# Patient Record
Sex: Female | Born: 1950
Health system: Southern US, Community
[De-identification: ages and names within clinical notes are randomized; demographics above are authoritative.]

## PROBLEM LIST (undated history)

## (undated) DIAGNOSIS — J449 Chronic obstructive pulmonary disease, unspecified: Secondary | ICD-10-CM

## (undated) DIAGNOSIS — R112 Nausea with vomiting, unspecified: Secondary | ICD-10-CM

## (undated) DIAGNOSIS — E119 Type 2 diabetes mellitus without complications: Secondary | ICD-10-CM

## (undated) DIAGNOSIS — N189 Chronic kidney disease, unspecified: Secondary | ICD-10-CM

## (undated) DIAGNOSIS — Z9889 Other specified postprocedural states: Secondary | ICD-10-CM

## (undated) DIAGNOSIS — D649 Anemia, unspecified: Secondary | ICD-10-CM

## (undated) DIAGNOSIS — R51 Headache: Secondary | ICD-10-CM

## (undated) DIAGNOSIS — I2510 Atherosclerotic heart disease of native coronary artery without angina pectoris: Secondary | ICD-10-CM

## (undated) DIAGNOSIS — R519 Headache, unspecified: Secondary | ICD-10-CM

## (undated) DIAGNOSIS — I10 Essential (primary) hypertension: Secondary | ICD-10-CM

## (undated) DIAGNOSIS — F419 Anxiety disorder, unspecified: Secondary | ICD-10-CM

## (undated) DIAGNOSIS — I509 Heart failure, unspecified: Secondary | ICD-10-CM

## (undated) DIAGNOSIS — IMO0001 Reserved for inherently not codable concepts without codable children: Secondary | ICD-10-CM

## (undated) HISTORY — PX: BREAST EXCISIONAL BIOPSY: SUR124

## (undated) HISTORY — PX: CHOLECYSTECTOMY: SHX55

## (undated) HISTORY — PX: ELBOW SURGERY: SHX618

## (undated) HISTORY — PX: BREAST SURGERY: SHX581

## (undated) HISTORY — PX: APPENDECTOMY: SHX54

## (undated) HISTORY — PX: ABDOMINAL HYSTERECTOMY: SHX81

## (undated) HISTORY — PX: CORONARY STENT PLACEMENT: SHX1402

## (undated) HISTORY — PX: CARDIAC CATHETERIZATION: SHX172

## (undated) HISTORY — PX: ANAL FISSURE REPAIR: SHX2312

## (undated) HISTORY — PX: TUBAL LIGATION: SHX77

---

## 1997-10-05 ENCOUNTER — Other Ambulatory Visit: Admission: RE | Admit: 1997-10-05 | Discharge: 1997-10-05 | Payer: Self-pay | Admitting: Obstetrics & Gynecology

## 1998-08-22 ENCOUNTER — Encounter: Admission: RE | Admit: 1998-08-22 | Discharge: 1998-11-20 | Payer: Self-pay | Admitting: Internal Medicine

## 1999-03-27 ENCOUNTER — Encounter: Payer: Self-pay | Admitting: Internal Medicine

## 1999-03-27 ENCOUNTER — Encounter: Admission: RE | Admit: 1999-03-27 | Discharge: 1999-03-27 | Payer: Self-pay | Admitting: Internal Medicine

## 1999-04-12 ENCOUNTER — Encounter: Admission: RE | Admit: 1999-04-12 | Discharge: 1999-04-12 | Payer: Self-pay | Admitting: Internal Medicine

## 1999-04-12 ENCOUNTER — Encounter: Payer: Self-pay | Admitting: Internal Medicine

## 1999-04-19 ENCOUNTER — Encounter: Payer: Self-pay | Admitting: General Surgery

## 1999-04-24 ENCOUNTER — Ambulatory Visit (HOSPITAL_COMMUNITY): Admission: RE | Admit: 1999-04-24 | Discharge: 1999-04-24 | Payer: Self-pay | Admitting: General Surgery

## 1999-04-24 ENCOUNTER — Encounter: Payer: Self-pay | Admitting: General Surgery

## 1999-04-24 ENCOUNTER — Encounter (INDEPENDENT_AMBULATORY_CARE_PROVIDER_SITE_OTHER): Payer: Self-pay

## 2000-04-14 ENCOUNTER — Encounter: Payer: Self-pay | Admitting: Obstetrics & Gynecology

## 2000-04-14 ENCOUNTER — Encounter: Admission: RE | Admit: 2000-04-14 | Discharge: 2000-04-14 | Payer: Self-pay | Admitting: Obstetrics & Gynecology

## 2000-07-10 ENCOUNTER — Ambulatory Visit (HOSPITAL_COMMUNITY): Admission: RE | Admit: 2000-07-10 | Discharge: 2000-07-11 | Payer: Self-pay | Admitting: Interventional Cardiology

## 2001-04-17 ENCOUNTER — Encounter: Admission: RE | Admit: 2001-04-17 | Discharge: 2001-04-17 | Payer: Self-pay | Admitting: Obstetrics & Gynecology

## 2001-04-17 ENCOUNTER — Encounter: Payer: Self-pay | Admitting: Obstetrics & Gynecology

## 2002-01-06 ENCOUNTER — Encounter: Admission: RE | Admit: 2002-01-06 | Discharge: 2002-04-06 | Payer: Self-pay | Admitting: Internal Medicine

## 2002-04-01 ENCOUNTER — Ambulatory Visit (HOSPITAL_COMMUNITY): Admission: RE | Admit: 2002-04-01 | Discharge: 2002-04-01 | Payer: Self-pay | Admitting: Interventional Cardiology

## 2002-05-11 ENCOUNTER — Encounter: Admission: RE | Admit: 2002-05-11 | Discharge: 2002-05-11 | Payer: Self-pay | Admitting: Obstetrics & Gynecology

## 2002-05-11 ENCOUNTER — Encounter: Payer: Self-pay | Admitting: Obstetrics & Gynecology

## 2003-02-25 ENCOUNTER — Ambulatory Visit (HOSPITAL_COMMUNITY): Admission: RE | Admit: 2003-02-25 | Discharge: 2003-02-25 | Payer: Self-pay | Admitting: Internal Medicine

## 2003-05-23 ENCOUNTER — Encounter: Admission: RE | Admit: 2003-05-23 | Discharge: 2003-05-23 | Payer: Self-pay | Admitting: Obstetrics & Gynecology

## 2004-05-28 ENCOUNTER — Encounter: Admission: RE | Admit: 2004-05-28 | Discharge: 2004-05-28 | Payer: Self-pay | Admitting: Obstetrics & Gynecology

## 2005-06-03 ENCOUNTER — Encounter: Admission: RE | Admit: 2005-06-03 | Discharge: 2005-06-03 | Payer: Self-pay | Admitting: Internal Medicine

## 2006-06-04 ENCOUNTER — Encounter: Admission: RE | Admit: 2006-06-04 | Discharge: 2006-06-04 | Payer: Self-pay | Admitting: Internal Medicine

## 2006-06-17 ENCOUNTER — Encounter (INDEPENDENT_AMBULATORY_CARE_PROVIDER_SITE_OTHER): Payer: Self-pay | Admitting: Specialist

## 2006-06-17 ENCOUNTER — Encounter: Admission: RE | Admit: 2006-06-17 | Discharge: 2006-06-17 | Payer: Self-pay | Admitting: Internal Medicine

## 2006-12-16 ENCOUNTER — Encounter: Admission: RE | Admit: 2006-12-16 | Discharge: 2006-12-16 | Payer: Self-pay | Admitting: Internal Medicine

## 2007-06-08 ENCOUNTER — Encounter: Admission: RE | Admit: 2007-06-08 | Discharge: 2007-06-08 | Payer: Self-pay | Admitting: Internal Medicine

## 2007-08-03 ENCOUNTER — Ambulatory Visit (HOSPITAL_COMMUNITY): Admission: RE | Admit: 2007-08-03 | Discharge: 2007-08-03 | Payer: Self-pay | Admitting: Internal Medicine

## 2008-06-08 ENCOUNTER — Encounter: Admission: RE | Admit: 2008-06-08 | Discharge: 2008-06-08 | Payer: Self-pay | Admitting: Internal Medicine

## 2009-06-09 ENCOUNTER — Encounter: Admission: RE | Admit: 2009-06-09 | Discharge: 2009-06-09 | Payer: Self-pay | Admitting: Obstetrics & Gynecology

## 2010-06-02 ENCOUNTER — Other Ambulatory Visit: Payer: Self-pay | Admitting: Internal Medicine

## 2010-06-02 DIAGNOSIS — Z0000 Encounter for general adult medical examination without abnormal findings: Secondary | ICD-10-CM

## 2010-06-13 ENCOUNTER — Ambulatory Visit
Admission: RE | Admit: 2010-06-13 | Discharge: 2010-06-13 | Disposition: A | Discharge status: Home / Self Care | Payer: Self-pay | Source: Ambulatory Visit | Attending: Internal Medicine | Admitting: Internal Medicine

## 2010-06-13 DIAGNOSIS — Z0000 Encounter for general adult medical examination without abnormal findings: Secondary | ICD-10-CM

## 2010-09-28 NOTE — Cardiovascular Report (Signed)
NAME:  Pamela [name], Pamela [name]                        ACCOUNT NO.:  [id number]   MEDICAL RECORD NO.:  [id number]                   PATIENT TYPE:  OIB   LOCATION:  2852                                 FACILITY:  [facility]   PHYSICIAN:  Lesleigh Noe, M.D.            DATE OF BIRTH:  1950/12/16   DATE OF PROCEDURE:  04/01/2002  DATE OF DISCHARGE:                              CARDIAC CATHETERIZATION   INDICATIONS FOR PROCEDURE:  Exertional dyspnea, and also angina.  The  dyspnea has gotten to the point that the patient has difficulty walking next  door to visit her parents which is less then 200 yards.  She denies  orthopnea and PND.  The dyspnea is not necessarily associated with chest  discomfort.   PROCEDURE:  1. Left heart catheterization.  2. Selective coronary angiography.  3. Left ventriculography.   DESCRIPTION OF PROCEDURE:  After informed consent, a 6 French sheath was  inserted into the right femoral artery using the modified Seldinger  technique.  A 6 French 8 multiple purpose catheter was used for hemodynamic  recording, left ventriculography by hand injection, selectively right  coronary angiography.  Left coronary angiography was performed with a 6  French #4 left Judkins catheter.  The patient tolerated the procedure  without significant complications.   RESULTS:  1. Hemodynamic data:     a. Aortic pressure was 129/72.     b. Left ventricular pressure was 131/15.  2. Left ventriculography:  The left ventricle experienced significant ectopy     during the hand injection.  Despite this, it is judged that overall     contractility is normal.  Ejection fraction is greater than 60%.  3. Selective coronary angiography:     a. Left main coronary:  The left main coronary artery  is short.  No        significant obstruction is noted.     b. Left anterior descending coronary:  The left anterior descending is        heavily calcified.  There is a region within the mid left  anterior        descending stent just beyond the first septal perforator, but there is        perhaps up to 40 to 50% narrowing.  This is seen in only one view.        All other views demonstrate a widely patent stented region.  The        diagonal that arises proximal to the origin of the left anterior        descending stent contains diffusely diseased mid segment with up to 80        to 90% narrowing in the medial branch of the bifurcation of this        diagonal.  This is unchanged from the prior study.     c. Circumflex artery:  No significant obstruction is noted in the  circumflex.  The circumflex gives origin to a large branch and first        obtuse marginal, and a small AV groove continuation supplies the left        atrial recurrent.  No obstruction is noted within the circumflex        system.     d. Right coronary:  The right coronary artery contains no significant        obstruction.  There is a origin to the posterior descending artery,        and a small left ventricular branch, as well as several LV branches.   CONCLUSIONS:  1. Widely patent left anterior descending stent with at most 30 to 50% mid     stent narrowing.  The first septal perforator that arises from within the     stented region is 80%.  The first diagonal at a region of bifurcation in     its mid segment contains an 80 to 90% narrowing.  This is unchanged from     prior evaluation.  Circumflex and right coronary arteries are normal.  2. Normal left ventricular function with normal left ventricular pressure.  3. Dyspnea on exertion.  Etiology uncertain.  Related possibly to diastolic     dysfunction.   PLAN:  1. Consider diuretic therapy.  2. Increase beta blocker therapy to help treat possible diastolic     dysfunction.  3. If medical therapy does not help dyspnea, consider pulmonary evaluation.                                               Lesleigh Noe, M.D.    HWS/MEDQ  D:   04/01/2002  T:  04/01/2002  Job:  [id number]   cc:   Theressa Millard, M.D.  [address] [place]  [place] [address]  Fax: [phone]

## 2011-05-13 ENCOUNTER — Other Ambulatory Visit: Payer: Self-pay | Admitting: Obstetrics & Gynecology

## 2011-05-13 DIAGNOSIS — Z1231 Encounter for screening mammogram for malignant neoplasm of breast: Secondary | ICD-10-CM

## 2011-06-17 ENCOUNTER — Ambulatory Visit
Admission: RE | Admit: 2011-06-17 | Discharge: 2011-06-17 | Disposition: A | Discharge status: Home / Self Care | Payer: 59 | Source: Ambulatory Visit | Attending: Obstetrics & Gynecology | Admitting: Obstetrics & Gynecology

## 2011-06-17 DIAGNOSIS — Z1231 Encounter for screening mammogram for malignant neoplasm of breast: Secondary | ICD-10-CM

## 2012-05-26 ENCOUNTER — Other Ambulatory Visit: Payer: Self-pay | Admitting: Obstetrics & Gynecology

## 2012-05-26 DIAGNOSIS — Z1231 Encounter for screening mammogram for malignant neoplasm of breast: Secondary | ICD-10-CM

## 2012-06-30 ENCOUNTER — Ambulatory Visit
Admission: RE | Admit: 2012-06-30 | Discharge: 2012-06-30 | Disposition: A | Discharge status: Home / Self Care | Payer: 59 | Source: Ambulatory Visit | Attending: Obstetrics & Gynecology | Admitting: Obstetrics & Gynecology

## 2012-06-30 DIAGNOSIS — Z1231 Encounter for screening mammogram for malignant neoplasm of breast: Secondary | ICD-10-CM

## 2012-10-29 ENCOUNTER — Encounter (HOSPITAL_COMMUNITY): Payer: Self-pay | Admitting: *Deleted

## 2012-10-29 ENCOUNTER — Other Ambulatory Visit: Payer: Self-pay | Admitting: Gastroenterology

## 2012-10-29 DIAGNOSIS — E119 Type 2 diabetes mellitus without complications: Secondary | ICD-10-CM

## 2012-10-29 HISTORY — PX: GANGLION CYST EXCISION: SHX1691

## 2012-10-29 HISTORY — DX: Type 2 diabetes mellitus without complications: E11.9

## 2012-10-30 ENCOUNTER — Encounter (HOSPITAL_COMMUNITY): Payer: Self-pay | Admitting: Pharmacy Technician

## 2012-11-17 ENCOUNTER — Ambulatory Visit (HOSPITAL_COMMUNITY)
Admission: RE | Admit: 2012-11-17 | Discharge: 2012-11-17 | Disposition: A | Discharge status: Home / Self Care | Payer: 59 | Source: Ambulatory Visit | Attending: Gastroenterology | Admitting: Gastroenterology

## 2012-11-17 ENCOUNTER — Encounter (HOSPITAL_COMMUNITY): Payer: Self-pay | Admitting: Anesthesiology

## 2012-11-17 ENCOUNTER — Ambulatory Visit (HOSPITAL_COMMUNITY): Payer: 59 | Admitting: Anesthesiology

## 2012-11-17 ENCOUNTER — Encounter (HOSPITAL_COMMUNITY): Payer: Self-pay | Admitting: Certified Registered Nurse Anesthetist

## 2012-11-17 ENCOUNTER — Encounter (HOSPITAL_COMMUNITY)
Admission: RE | Disposition: A | Discharge status: Home / Self Care | Payer: Self-pay | Source: Ambulatory Visit | Attending: Gastroenterology

## 2012-11-17 DIAGNOSIS — J449 Chronic obstructive pulmonary disease, unspecified: Secondary | ICD-10-CM | POA: Insufficient documentation

## 2012-11-17 DIAGNOSIS — T39095A Adverse effect of salicylates, initial encounter: Secondary | ICD-10-CM | POA: Insufficient documentation

## 2012-11-17 DIAGNOSIS — I129 Hypertensive chronic kidney disease with stage 1 through stage 4 chronic kidney disease, or unspecified chronic kidney disease: Secondary | ICD-10-CM | POA: Insufficient documentation

## 2012-11-17 DIAGNOSIS — J4489 Other specified chronic obstructive pulmonary disease: Secondary | ICD-10-CM | POA: Insufficient documentation

## 2012-11-17 DIAGNOSIS — Z7982 Long term (current) use of aspirin: Secondary | ICD-10-CM | POA: Insufficient documentation

## 2012-11-17 DIAGNOSIS — K2960 Other gastritis without bleeding: Secondary | ICD-10-CM | POA: Insufficient documentation

## 2012-11-17 DIAGNOSIS — I2510 Atherosclerotic heart disease of native coronary artery without angina pectoris: Secondary | ICD-10-CM | POA: Insufficient documentation

## 2012-11-17 DIAGNOSIS — E11359 Type 2 diabetes mellitus with proliferative diabetic retinopathy without macular edema: Secondary | ICD-10-CM | POA: Insufficient documentation

## 2012-11-17 DIAGNOSIS — N189 Chronic kidney disease, unspecified: Secondary | ICD-10-CM | POA: Insufficient documentation

## 2012-11-17 DIAGNOSIS — D509 Iron deficiency anemia, unspecified: Secondary | ICD-10-CM | POA: Insufficient documentation

## 2012-11-17 DIAGNOSIS — E1139 Type 2 diabetes mellitus with other diabetic ophthalmic complication: Secondary | ICD-10-CM | POA: Insufficient documentation

## 2012-11-17 HISTORY — PX: ESOPHAGOGASTRODUODENOSCOPY (EGD) WITH PROPOFOL: SHX5813

## 2012-11-17 HISTORY — DX: Atherosclerotic heart disease of native coronary artery without angina pectoris: I25.10

## 2012-11-17 HISTORY — DX: Type 2 diabetes mellitus without complications: E11.9

## 2012-11-17 HISTORY — DX: Essential (primary) hypertension: I10

## 2012-11-17 HISTORY — DX: Nausea with vomiting, unspecified: R11.2

## 2012-11-17 HISTORY — PX: COLONOSCOPY WITH PROPOFOL: SHX5780

## 2012-11-17 HISTORY — DX: Other specified postprocedural states: Z98.890

## 2012-11-17 HISTORY — DX: Chronic obstructive pulmonary disease, unspecified: J44.9

## 2012-11-17 LAB — POCT I-STAT 4, (NA,K, GLUC, HGB,HCT)
Glucose, Bld: 200 mg/dL — ABNORMAL HIGH (ref 70–99)
HCT: 36 % (ref 36.0–46.0)

## 2012-11-17 SURGERY — COLONOSCOPY WITH PROPOFOL
Anesthesia: Monitor Anesthesia Care

## 2012-11-17 MED ORDER — SODIUM CHLORIDE 0.9 % IV SOLN: INTRAVENOUS | Status: DC

## 2012-11-17 MED ORDER — KETAMINE HCL 10 MG/ML IJ SOLN
INTRAMUSCULAR | Status: DC | PRN
  Administered 2012-11-17 (×3): 10 mg via INTRAVENOUS

## 2012-11-17 MED ORDER — PROMETHAZINE HCL 25 MG/ML IJ SOLN: 6.2500 mg | INTRAMUSCULAR | Status: DC | PRN

## 2012-11-17 MED ORDER — LACTATED RINGERS IV SOLN
INTRAVENOUS | Status: DC
  Administered 2012-11-17: 09:00:00 via INTRAVENOUS
  Administered 2012-11-17: 1000 mL via INTRAVENOUS

## 2012-11-17 MED ORDER — PROPOFOL INFUSION 10 MG/ML OPTIME
INTRAVENOUS | Status: DC | PRN
  Administered 2012-11-17: 120 ug/kg/min via INTRAVENOUS

## 2012-11-17 MED ORDER — MIDAZOLAM HCL 5 MG/5ML IJ SOLN
INTRAMUSCULAR | Status: DC | PRN
  Administered 2012-11-17: 2 mg via INTRAVENOUS

## 2012-11-17 MED ORDER — LIDOCAINE HCL 1 % IJ SOLN
INTRAMUSCULAR | Status: AC
  Filled 2012-11-17: qty 20

## 2012-11-17 SURGICAL SUPPLY — 24 items

## 2012-11-17 NOTE — Op Note (Signed)
Problem: Iron deficiency anemia taking aspirin 325 mg daily  Endoscopist: Johnson  Premedication: Propofol administered by anesthesia  Procedure: Diagnostic esophagogastroduodenoscopy. The patient was placed in the left lateral decubitus position. The Pentax gastroscope was passed through the posterior hypopharynx into the proximal esophagus without difficulty. The hypopharynx, larynx, and vocal cords appeared normal.  Esophagoscopy: The proximal, mid, and lower segments of the esophageal mucosa appear normal. The squamocolumnar junction is noted at 40 cm from the incisor teeth. There is no endoscopic evidence for the presence of erosive esophagitis or Barrett's esophagus.  Gastroscopy: Retroflex view of the gastric cardia and fundus was normal. The gastric body appeared normal. There are a few scattered nonbleeding erosions in the gastric antrum. The pylorus is normal.  Duodenoscopy: The duodenal bulb and descending duodenum appear normal.  Assessment: Normal esophagogastroduodenoscopy except for the presence of a few erosions in the gastric antrum secondary to chronic aspirin use.  Procedure: Diagnostic colonoscopy. Anal inspection and digital rectal exam were normal. The Pentax ultrathin colonoscope was introduced into the rectum and advanced to the cecum. A normal-appearing ileocecal valve and appendiceal orifice were identified. Colonic preparation for the exam today was good.  Rectum. Normal. Retroflex view of the distal rectum normal.  Sigmoid colon and descending colon. Normal.  Splenic flexure. Normal.  Transverse colon. Normal.  Hepatic flexure. Normal.  Ascending colon. Normal.  Cecum and ileocecal valve. Normal.  Assessment: Normal diagnostic proctocolonoscopy to the cecum.  Recommendations: Take oral iron sulfate 325 mg daily. Check serum ferritin and hemoglobin in 12 weeks. Reduce aspirin dose to 81 mg daily .

## 2012-11-17 NOTE — Anesthesia Postprocedure Evaluation (Signed)
Anesthesia Post Note  Patient: Pamela [name]  Procedure(s) Performed: Procedure(s) (LRB): COLONOSCOPY WITH PROPOFOL (N/A) ESOPHAGOGASTRODUODENOSCOPY (EGD) WITH PROPOFOL (N/A)  Anesthesia type: MAC  Patient location: PACU  Post pain: Pain level controlled  Post assessment: Post-op Vital signs reviewed  Last Vitals:  Filed Vitals:   11/17/12 1031  BP: 148/77  Pulse:   Temp:   Resp: 18    Post vital signs: Reviewed  Level of consciousness: sedated  Complications: No apparent anesthesia complications

## 2012-11-17 NOTE — Anesthesia Preprocedure Evaluation (Signed)
Anesthesia Evaluation  Patient identified by MRN, date of birth, ID band Patient awake    Reviewed: Allergy & Precautions, H&P , NPO status , Patient's Chart, lab work & pertinent test results  History of Anesthesia Complications (+) PONV  Airway Mallampati: II TM Distance: >3 FB Neck ROM: Full    Dental  (+) Edentulous Upper and Edentulous Lower   Pulmonary shortness of breath and with exertion, COPD COPD inhaler,  breath sounds clear to auscultation  Pulmonary exam normal       Cardiovascular hypertension, Pt. on medications + CAD and + Cardiac Stents Rhythm:Regular Rate:Tachycardia     Neuro/Psych negative neurological ROS  negative psych ROS   GI/Hepatic negative GI ROS, Neg liver ROS,   Endo/Other  diabetes, Type 2, Oral Hypoglycemic Agents and Insulin Dependent  Renal/GU negative Renal ROS  negative genitourinary   Musculoskeletal negative musculoskeletal ROS (+)   Abdominal   Peds  Hematology negative hematology ROS (+)   Anesthesia Other Findings   Reproductive/Obstetrics                           Anesthesia Physical Anesthesia Plan  ASA: III  Anesthesia Plan: MAC   Post-op Pain Management:    Induction: Intravenous  Airway Management Planned: Simple Face Mask  Additional Equipment:   Intra-op Plan:   Post-operative Plan:   Informed Consent: I have reviewed the patients History and Physical, chart, labs and discussed the procedure including the risks, benefits and alternatives for the proposed anesthesia with the patient or authorized representative who has indicated his/her understanding and acceptance.   Dental advisory given  Plan Discussed with: CRNA  Anesthesia Plan Comments:         Anesthesia Quick Evaluation

## 2012-11-17 NOTE — Transfer of Care (Signed)
Immediate Anesthesia Transfer of Care Note  Patient: MAISEE VOLLMAN  Procedure(s) Performed: Procedure(s): COLONOSCOPY WITH PROPOFOL (N/A) ESOPHAGOGASTRODUODENOSCOPY (EGD) WITH PROPOFOL (N/A)  Patient Location: PACU and Endoscopy Unit  Anesthesia Type:MAC  Level of Consciousness: awake and alert   Airway & Oxygen Therapy: Patient Spontanous Breathing and Patient connected to nasal cannula oxygen  Post-op Assessment: Report given to PACU RN and Post -op Vital signs reviewed and stable  Post vital signs: Reviewed and stable  Complications: No apparent anesthesia complications

## 2012-11-17 NOTE — H&P (Signed)
  Problem: Iron deficiency anemia  History: The patient is a 62 year old female born 05/14/1950. The patient underwent a normal screening colonoscopy in March 1999. The patient was diagnosed with iron deficiency anemia with a serum ferritin 4.4 ng/mL. She takes aspirin 325 mg on a daily basis. She had coronary artery stents placed in 2004.  The patient feels well. Intermittently she experiences pain in the rectum. Her bowel movements remain normal. She denies gastrointestinal bleeding.  The patient is scheduled to undergo a diagnostic esophagogastroduodenoscopy and colonoscopy to evaluate iron deficiency anemia.  Past medical and surgical history: Chronic obstructive pulmonary disease. Coronary artery disease. 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 Hypertension. Cholecystectomy. Hysterectomy. Appendectomy.  Allergies: None  Exam: The patient is alert and lying comfortably on the endoscopy stretcher. Abdomen is soft, flat, and nontender to palpation. Cardiac exam reveals a regular rhythm. Lungs are clear to auscultation.  Plan: Proceed with diagnostic esophagogastroduodenoscopy and colonoscopy to evaluate iron deficiency anemia while taking aspirin 325 mg daily basis.

## 2012-11-18 ENCOUNTER — Encounter (HOSPITAL_COMMUNITY): Payer: Self-pay | Admitting: Gastroenterology

## 2013-06-01 ENCOUNTER — Other Ambulatory Visit: Payer: Self-pay

## 2013-06-01 DIAGNOSIS — Z1231 Encounter for screening mammogram for malignant neoplasm of breast: Secondary | ICD-10-CM

## 2013-06-01 DIAGNOSIS — Z803 Family history of malignant neoplasm of breast: Secondary | ICD-10-CM

## 2013-07-01 ENCOUNTER — Ambulatory Visit
Admission: RE | Admit: 2013-07-01 | Discharge: 2013-07-01 | Disposition: A | Discharge status: Home / Self Care | Payer: 59 | Source: Ambulatory Visit

## 2013-07-01 DIAGNOSIS — Z1231 Encounter for screening mammogram for malignant neoplasm of breast: Secondary | ICD-10-CM

## 2013-07-01 DIAGNOSIS — Z803 Family history of malignant neoplasm of breast: Secondary | ICD-10-CM

## 2014-03-25 ENCOUNTER — Inpatient Hospital Stay (HOSPITAL_COMMUNITY)
Admission: EM | Admit: 2014-03-25 | Discharge: 2014-03-28 | DRG: 812 | Disposition: A | Discharge status: Home / Self Care | Payer: Medicare Other | Attending: Family Medicine | Admitting: Family Medicine

## 2014-03-25 ENCOUNTER — Encounter (HOSPITAL_COMMUNITY): Payer: Self-pay | Admitting: *Deleted

## 2014-03-25 ENCOUNTER — Encounter: Payer: Self-pay | Admitting: Interventional Cardiology

## 2014-03-25 ENCOUNTER — Ambulatory Visit (INDEPENDENT_AMBULATORY_CARE_PROVIDER_SITE_OTHER): Payer: Medicare Other | Admitting: Interventional Cardiology

## 2014-03-25 ENCOUNTER — Telehealth: Payer: Self-pay

## 2014-03-25 ENCOUNTER — Emergency Department (HOSPITAL_COMMUNITY): Payer: Medicare Other

## 2014-03-25 VITALS — BP 140/90 | HR 92 | Ht 61.0 in | Wt 118.0 lb

## 2014-03-25 DIAGNOSIS — Z7982 Long term (current) use of aspirin: Secondary | ICD-10-CM

## 2014-03-25 DIAGNOSIS — Z87891 Personal history of nicotine dependence: Secondary | ICD-10-CM | POA: Diagnosis not present

## 2014-03-25 DIAGNOSIS — D509 Iron deficiency anemia, unspecified: Secondary | ICD-10-CM | POA: Diagnosis present

## 2014-03-25 DIAGNOSIS — I25110 Atherosclerotic heart disease of native coronary artery with unstable angina pectoris: Secondary | ICD-10-CM | POA: Diagnosis present

## 2014-03-25 DIAGNOSIS — I129 Hypertensive chronic kidney disease with stage 1 through stage 4 chronic kidney disease, or unspecified chronic kidney disease: Secondary | ICD-10-CM | POA: Diagnosis present

## 2014-03-25 DIAGNOSIS — E785 Hyperlipidemia, unspecified: Secondary | ICD-10-CM | POA: Diagnosis present

## 2014-03-25 DIAGNOSIS — Z955 Presence of coronary angioplasty implant and graft: Secondary | ICD-10-CM

## 2014-03-25 DIAGNOSIS — R079 Chest pain, unspecified: Secondary | ICD-10-CM | POA: Diagnosis present

## 2014-03-25 DIAGNOSIS — I209 Angina pectoris, unspecified: Secondary | ICD-10-CM

## 2014-03-25 DIAGNOSIS — I248 Other forms of acute ischemic heart disease: Secondary | ICD-10-CM | POA: Diagnosis present

## 2014-03-25 DIAGNOSIS — E119 Type 2 diabetes mellitus without complications: Secondary | ICD-10-CM | POA: Diagnosis present

## 2014-03-25 DIAGNOSIS — D649 Anemia, unspecified: Secondary | ICD-10-CM

## 2014-03-25 DIAGNOSIS — I10 Essential (primary) hypertension: Secondary | ICD-10-CM

## 2014-03-25 DIAGNOSIS — J449 Chronic obstructive pulmonary disease, unspecified: Secondary | ICD-10-CM | POA: Diagnosis present

## 2014-03-25 DIAGNOSIS — I2510 Atherosclerotic heart disease of native coronary artery without angina pectoris: Secondary | ICD-10-CM | POA: Diagnosis present

## 2014-03-25 DIAGNOSIS — E0859 Diabetes mellitus due to underlying condition with other circulatory complications: Secondary | ICD-10-CM

## 2014-03-25 DIAGNOSIS — N184 Chronic kidney disease, stage 4 (severe): Secondary | ICD-10-CM | POA: Diagnosis present

## 2014-03-25 DIAGNOSIS — R0600 Dyspnea, unspecified: Secondary | ICD-10-CM

## 2014-03-25 LAB — I-STAT TROPONIN, ED: Troponin i, poc: 0.02 ng/mL (ref 0.00–0.08)

## 2014-03-25 LAB — URINALYSIS, ROUTINE W REFLEX MICROSCOPIC
BILIRUBIN URINE: NEGATIVE
GLUCOSE, UA: NEGATIVE mg/dL
Hgb urine dipstick: NEGATIVE
KETONES UR: NEGATIVE mg/dL
Leukocytes, UA: NEGATIVE
Nitrite: NEGATIVE
PH: 5 (ref 5.0–8.0)
PROTEIN: 100 mg/dL — AB
Specific Gravity, Urine: 1.016 (ref 1.005–1.030)
Urobilinogen, UA: 0.2 mg/dL (ref 0.0–1.0)

## 2014-03-25 LAB — COMPREHENSIVE METABOLIC PANEL
ALT: 13 U/L (ref 0–35)
AST: 20 U/L (ref 0–37)
Albumin: 3.9 g/dL (ref 3.5–5.2)
Alkaline Phosphatase: 46 U/L (ref 39–117)
Anion gap: 25 — ABNORMAL HIGH (ref 5–15)
BUN: 48 mg/dL — ABNORMAL HIGH (ref 6–23)
CALCIUM: 9.3 mg/dL (ref 8.4–10.5)
CO2: 19 meq/L (ref 19–32)
CREATININE: 1.39 mg/dL — AB (ref 0.50–1.10)
Chloride: 92 mEq/L — ABNORMAL LOW (ref 96–112)
GFR, EST AFRICAN AMERICAN: 45 mL/min — AB (ref >90)
GFR, EST NON AFRICAN AMERICAN: 39 mL/min — AB (ref >90)
GLUCOSE: 296 mg/dL — AB (ref 70–99)
Potassium: 4.4 mEq/L (ref 3.7–5.3)
SODIUM: 136 meq/L — AB (ref 137–147)
Total Bilirubin: 0.3 mg/dL (ref 0.3–1.2)
Total Protein: 6.8 g/dL (ref 6.0–8.3)

## 2014-03-25 LAB — IRON AND TIBC
IRON: 11 ug/dL — AB (ref 42–135)
Iron: 11 ug/dL — ABNORMAL LOW (ref 42–135)
SATURATION RATIOS: 2 % — AB (ref 20–55)
Saturation Ratios: 3 % — ABNORMAL LOW (ref 20–55)
TIBC: 441 ug/dL (ref 250–470)
TIBC: 431 ug/dL (ref 250–470)
UIBC: 430 ug/dL — ABNORMAL HIGH (ref 125–400)
UIBC: 420 ug/dL — AB (ref 125–400)

## 2014-03-25 LAB — CBC
HCT: 25 % — ABNORMAL LOW (ref 36.0–46.0)
HEMOGLOBIN: 7.3 g/dL — AB (ref 12.0–15.0)
MCH: 21.5 pg — ABNORMAL LOW (ref 26.0–34.0)
MCHC: 29.2 g/dL — ABNORMAL LOW (ref 30.0–36.0)
MCV: 73.5 fL — ABNORMAL LOW (ref 78.0–100.0)
Platelets: 313 10*3/uL (ref 150–400)
RBC: 3.4 MIL/uL — AB (ref 3.87–5.11)
RDW: 16.8 % — ABNORMAL HIGH (ref 11.5–15.5)
WBC: 8 10*3/uL (ref 4.0–10.5)

## 2014-03-25 LAB — GLUCOSE, CAPILLARY
Glucose-Capillary: 227 mg/dL — ABNORMAL HIGH (ref 70–99)
Glucose-Capillary: 124 mg/dL — ABNORMAL HIGH (ref 70–99)

## 2014-03-25 LAB — CBC WITH DIFFERENTIAL/PLATELET
Basophils Absolute: 0.1 10*3/uL (ref 0.0–0.1)
Basophils Relative: 0.7 % (ref 0.0–3.0)
EOS PCT: 2.8 % (ref 0.0–5.0)
Eosinophils Absolute: 0.2 10*3/uL (ref 0.0–0.7)
HCT: 21.9 % — CL (ref 36.0–46.0)
Hemoglobin: 6.7 g/dL — CL (ref 12.0–15.0)
Lymphocytes Relative: 20 % (ref 12.0–46.0)
Lymphs Abs: 1.5 10*3/uL (ref 0.7–4.0)
MCHC: 30.4 g/dL (ref 30.0–36.0)
MCV: 69.9 fl — ABNORMAL LOW (ref 78.0–100.0)
MONO ABS: 0.4 10*3/uL (ref 0.1–1.0)
Monocytes Relative: 5.1 % (ref 3.0–12.0)
NEUTROS PCT: 71.4 % (ref 43.0–77.0)
Neutro Abs: 5.4 10*3/uL (ref 1.4–7.7)
PLATELETS: 287 10*3/uL (ref 150.0–400.0)
RBC: 3.13 Mil/uL — ABNORMAL LOW (ref 3.87–5.11)
RDW: 16.9 % — ABNORMAL HIGH (ref 11.5–15.5)
WBC: 7.5 10*3/uL (ref 4.0–10.5)

## 2014-03-25 LAB — I-STAT CHEM 8, ED
BUN: 49 mg/dL — ABNORMAL HIGH (ref 6–23)
Calcium, Ion: 1.1 mmol/L — ABNORMAL LOW (ref 1.13–1.30)
Chloride: 97 mEq/L (ref 96–112)
Creatinine, Ser: 1.6 mg/dL — ABNORMAL HIGH (ref 0.50–1.10)
GLUCOSE: 300 mg/dL — AB (ref 70–99)
HEMATOCRIT: 27 % — AB (ref 36.0–46.0)
HEMOGLOBIN: 9.2 g/dL — AB (ref 12.0–15.0)
Potassium: 4.2 mEq/L (ref 3.7–5.3)
Sodium: 136 mEq/L — ABNORMAL LOW (ref 137–147)
TCO2: 22 mmol/L (ref 0–100)

## 2014-03-25 LAB — BASIC METABOLIC PANEL
BUN: 49 mg/dL — ABNORMAL HIGH (ref 6–23)
CALCIUM: 8.7 mg/dL (ref 8.4–10.5)
CO2: 25 mEq/L (ref 19–32)
Chloride: 97 mEq/L (ref 96–112)
Creatinine, Ser: 1.5 mg/dL — ABNORMAL HIGH (ref 0.4–1.2)
GFR: 37.73 mL/min — AB (ref >60.00)
Glucose, Bld: 285 mg/dL — ABNORMAL HIGH (ref 70–99)
Potassium: 4.3 mEq/L (ref 3.5–5.1)
SODIUM: 132 meq/L — AB (ref 135–145)

## 2014-03-25 LAB — TSH: TSH: 2.03 u[IU]/mL (ref 0.350–4.500)

## 2014-03-25 LAB — FERRITIN
Ferritin: 4 ng/mL — ABNORMAL LOW (ref 10–291)
Ferritin: 4 ng/mL — ABNORMAL LOW (ref 10–291)

## 2014-03-25 LAB — PREPARE RBC (CROSSMATCH)

## 2014-03-25 LAB — URINE MICROSCOPIC-ADD ON

## 2014-03-25 LAB — LACTATE DEHYDROGENASE: LDH: 242 U/L (ref 94–250)

## 2014-03-25 LAB — RETICULOCYTES
RBC.: 3.28 MIL/uL — ABNORMAL LOW (ref 3.87–5.11)
RETIC CT PCT: 1.8 % (ref 0.4–3.1)
Retic Count, Absolute: 59 10*3/uL (ref 19.0–186.0)

## 2014-03-25 LAB — POC OCCULT BLOOD, ED: Fecal Occult Bld: NEGATIVE

## 2014-03-25 LAB — FOLATE

## 2014-03-25 LAB — VITAMIN B12: Vitamin B-12: 371 pg/mL (ref 211–911)

## 2014-03-25 LAB — ABO/RH: ABO/RH(D): A POS

## 2014-03-25 LAB — BRAIN NATRIURETIC PEPTIDE: Pro B Natriuretic peptide (BNP): 284 pg/mL — ABNORMAL HIGH (ref 0.0–100.0)

## 2014-03-25 LAB — TROPONIN I

## 2014-03-25 LAB — SAVE SMEAR

## 2014-03-25 MED ORDER — OXYCODONE HCL 5 MG PO TABS: 5.0000 mg | ORAL_TABLET | ORAL | Status: DC | PRN

## 2014-03-25 MED ORDER — ACETAMINOPHEN 650 MG RE SUPP: 650.0000 mg | Freq: Four times a day (QID) | RECTAL | Status: DC | PRN

## 2014-03-25 MED ORDER — SODIUM CHLORIDE 0.9 % IJ SOLN: 3.0000 mL | Freq: Two times a day (BID) | INTRAMUSCULAR | Status: DC

## 2014-03-25 MED ORDER — ONDANSETRON HCL 4 MG PO TABS: 4.0000 mg | ORAL_TABLET | Freq: Four times a day (QID) | ORAL | Status: DC | PRN

## 2014-03-25 MED ORDER — SALMETEROL XINAFOATE 50 MCG/DOSE IN AEPB
1.0000 | INHALATION_SPRAY | Freq: Two times a day (BID) | RESPIRATORY_TRACT | Status: DC
  Administered 2014-03-25 – 2014-03-28 (×6): 1 via RESPIRATORY_TRACT
  Filled 2014-03-25: qty 0

## 2014-03-25 MED ORDER — SODIUM CHLORIDE 0.9 % IV SOLN: 250.0000 mL | INTRAVENOUS | Status: DC | PRN

## 2014-03-25 MED ORDER — FUROSEMIDE 10 MG/ML IJ SOLN
20.0000 mg | Freq: Once | INTRAMUSCULAR | Status: AC
  Administered 2014-03-25: 20 mg via INTRAVENOUS
  Filled 2014-03-25: qty 2

## 2014-03-25 MED ORDER — ALPRAZOLAM 0.5 MG PO TABS
0.5000 mg | ORAL_TABLET | Freq: Two times a day (BID) | ORAL | Status: DC | PRN
  Administered 2014-03-26: 0.5 mg via ORAL
  Filled 2014-03-25: qty 1

## 2014-03-25 MED ORDER — SODIUM CHLORIDE 0.9 % IJ SOLN: 3.0000 mL | INTRAMUSCULAR | Status: DC | PRN

## 2014-03-25 MED ORDER — ONDANSETRON HCL 4 MG/2ML IJ SOLN: 4.0000 mg | Freq: Four times a day (QID) | INTRAMUSCULAR | Status: DC | PRN

## 2014-03-25 MED ORDER — SODIUM CHLORIDE 0.9 % IJ SOLN
3.0000 mL | Freq: Two times a day (BID) | INTRAMUSCULAR | Status: DC
  Administered 2014-03-25 – 2014-03-27 (×5): 3 mL via INTRAVENOUS

## 2014-03-25 MED ORDER — CARVEDILOL 25 MG PO TABS
25.0000 mg | ORAL_TABLET | Freq: Two times a day (BID) | ORAL | Status: DC
  Administered 2014-03-25 – 2014-03-27 (×4): 25 mg via ORAL
  Filled 2014-03-25 (×6): qty 1

## 2014-03-25 MED ORDER — ASPIRIN EC 81 MG PO TBEC
81.0000 mg | DELAYED_RELEASE_TABLET | Freq: Every day | ORAL | Status: DC
  Administered 2014-03-26 – 2014-03-28 (×3): 81 mg via ORAL
  Filled 2014-03-25 (×4): qty 1

## 2014-03-25 MED ORDER — LABETALOL HCL 5 MG/ML IV SOLN
10.0000 mg | INTRAVENOUS | Status: DC | PRN
  Administered 2014-03-25 – 2014-03-27 (×2): 10 mg via INTRAVENOUS
  Filled 2014-03-25 (×4): qty 4

## 2014-03-25 MED ORDER — INSULIN ASPART 100 UNIT/ML ~~LOC~~ SOLN
0.0000 [IU] | Freq: Three times a day (TID) | SUBCUTANEOUS | Status: DC
  Administered 2014-03-25 – 2014-03-26 (×2): 5 [IU] via SUBCUTANEOUS
  Administered 2014-03-26: 8 [IU] via SUBCUTANEOUS
  Administered 2014-03-26: 5 [IU] via SUBCUTANEOUS
  Administered 2014-03-27: 2 [IU] via SUBCUTANEOUS
  Administered 2014-03-27: 5 [IU] via SUBCUTANEOUS
  Administered 2014-03-27 – 2014-03-28 (×2): 3 [IU] via SUBCUTANEOUS

## 2014-03-25 MED ORDER — ACETAMINOPHEN 325 MG PO TABS
650.0000 mg | ORAL_TABLET | Freq: Four times a day (QID) | ORAL | Status: DC | PRN
  Administered 2014-03-27: 650 mg via ORAL
  Filled 2014-03-25: qty 2

## 2014-03-25 MED ORDER — ATORVASTATIN CALCIUM 10 MG PO TABS
10.0000 mg | ORAL_TABLET | Freq: Every morning | ORAL | Status: DC
  Administered 2014-03-26 – 2014-03-28 (×3): 10 mg via ORAL
  Filled 2014-03-25 (×3): qty 1

## 2014-03-25 MED ORDER — ISOSORBIDE MONONITRATE ER 60 MG PO TB24
60.0000 mg | ORAL_TABLET | Freq: Every day | ORAL | Status: DC
  Administered 2014-03-25 – 2014-03-26 (×2): 60 mg via ORAL
  Filled 2014-03-25 (×2): qty 1

## 2014-03-25 MED ORDER — ALUM & MAG HYDROXIDE-SIMETH 200-200-20 MG/5ML PO SUSP: 30.0000 mL | Freq: Four times a day (QID) | ORAL | Status: DC | PRN

## 2014-03-25 MED ORDER — INSULIN GLARGINE 100 UNIT/ML ~~LOC~~ SOLN
12.0000 [IU] | Freq: Every day | SUBCUTANEOUS | Status: DC
  Filled 2014-03-25 (×2): qty 0.12

## 2014-03-25 MED ORDER — SODIUM CHLORIDE 0.9 % IV SOLN
10.0000 mL/h | Freq: Once | INTRAVENOUS | Status: AC
  Administered 2014-03-25: 10 mL/h via INTRAVENOUS

## 2014-03-25 MED ORDER — LATANOPROST 0.005 % OP SOLN
1.0000 [drp] | Freq: Every day | OPHTHALMIC | Status: DC
  Administered 2014-03-25 – 2014-03-27 (×3): 1 [drp] via OPHTHALMIC
  Filled 2014-03-25: qty 2.5

## 2014-03-25 MED ORDER — SODIUM CHLORIDE 0.9 % IV SOLN: Freq: Once | INTRAVENOUS | Status: AC

## 2014-03-25 MED ORDER — NITROGLYCERIN 0.4 MG SL SUBL: 0.4000 mg | SUBLINGUAL_TABLET | SUBLINGUAL | Status: DC | PRN

## 2014-03-25 MED ORDER — INSULIN ASPART 100 UNIT/ML ~~LOC~~ SOLN
0.0000 [IU] | Freq: Every day | SUBCUTANEOUS | Status: DC
  Administered 2014-03-26 – 2014-03-27 (×2): 2 [IU] via SUBCUTANEOUS

## 2014-03-25 NOTE — Patient Instructions (Signed)
Your physician recommends that you continue on your current medications as directed. Please refer to the Current Medication list given to you today.  Lab Today: Cbc, Bnp  Your physician recommends that you schedule a follow-up appointment pending lab results

## 2014-03-25 NOTE — ED Notes (Signed)
Pt. returned from XR. 

## 2014-03-25 NOTE — ED Notes (Signed)
Called phlebotomy to draw labs at triage.

## 2014-03-25 NOTE — ED Notes (Signed)
Spoke to EDP.  Pt is appropriate for telemetry floor.

## 2014-03-25 NOTE — H&P (Signed)
[facility] History and Physical  RAYLINN KOSAR ZOX:[id number] DOB: July 26, 1950 DOA: 03/25/2014  Referring physician:  PCP: No primary care provider on file.   Chief Complaint: Shortness of breath/chest pain  HPI: Pamela [name] is a 63 y.o. female with a past medical history of coronary artery disease status post percutaneous intervention in 2002, diabetes mellitus, history of iron deficiency anemia undergoing GI workup with EGD and colonoscopy performed by Dr. Laural Benes on 11/17/2012 which was unremarkable, who was sent to the emergency room at [facility] by her cardiologist. Patient presenting with complaints of chest pain over the past month becoming progressively worse. She describes her chest pain as both sharp/tapping as well as pressure-like, located in the retrosternal region, nonradiating, occurring any time in her day. She complains of associated shortness of breath that has also been progressively worse over the past month. Patient also reports generalized weakness, fatigue, poor tolerance to physical exertion. Family members reporting that lately she has been spending them majority of her day sitting on the couch. Lab work today showed a hemoglobin of 6.7 with hematocrit of 21.9. She denies bloody stools, bright red blood per rectum, melena, hematemesis. She had previously been on iron which she stopped taking in August.  Other notable lab work in the emergency room included troponin of 0.02 with EKG not showing acute ischemic changes.  Review of Systems:  Constitutional:  No weight loss,  night sweats, Fevers, chills, positive for fatigue, generalized weakness, poor tolerance to physical exertion  HEENT:  No headaches, Difficulty swallowing,Tooth/dental problems,Sore throat,  No sneezing, itching, ear ache, nasal congestion, post nasal drip,  Cardio-vascular:  Positive for chest pain, Orthopnea, PND, swelling in lower extremities, denies anasarca, dizziness, palpitations  GI:  No heartburn, indigestion, abdominal pain, nausea, vomiting, diarrhea, change in bowel habits, loss of appetite  Resp:  Positive forshortness of breath with exertion or at rest. No excess mucus, no productive cough, No non-productive cough, No coughing up of blood.No change in color of mucus.No wheezing.No chest wall deformity  Skin:  no rash or lesions.  GU:  no dysuria, change in color of urine, no urgency or frequency. No flank pain.  Musculoskeletal:  No joint pain or swelling. No decreased range of motion. No back pain.  Psych:  No change in mood or affect. No depression or anxiety. No memory loss.   Past Medical History  Diagnosis Date  . PONV (postoperative nausea and vomiting)   . Hypertension   . COPD (chronic obstructive pulmonary disease)   . Diabetes mellitus without complication 10-29-12  . Coronary artery disease    Past Surgical History  Procedure Laterality Date  . Abdominal hysterectomy    . Elbow surgery Right     tendon surgery  . Tubal ligation    . Breast surgery      cyst removed  . Cholecystectomy      '74-open  . Appendectomy      '74- open with gallbladder  . Cardiac catheterization      1 coronary stent placed  . Ganglion cyst excision Bilateral 10-29-12    wrist  . Anal fissure repair    . Colonoscopy with propofol N/A 11/17/2012    Procedure: COLONOSCOPY WITH PROPOFOL;  Surgeon: Charolett BumpersMartin K Johnson, MD;  Location: [facility]COPY;  Service: Endoscopy;  Laterality: N/A;  . Esophagogastroduodenoscopy (egd) with propofol N/A 11/17/2012    Procedure:  ESOPHAGOGASTRODUODENOSCOPY (EGD) WITH PROPOFOL;  Surgeon: Charolett BumpersMartin K Johnson, MD;  Location: [facility] ENDOSCOPY;  Service: Endoscopy;  Laterality: N/A;   Social History:  reports that she quit smoking about 21 years ago. Her smoking use included Cigarettes. She smoked 1.50 packs per day. She does not have any smokeless tobacco history on file. She reports that she does not drink alcohol or use illicit drugs.  No Known Allergies  History reviewed. No pertinent family history.   Prior to Admission medications   Medication Sig Start Date End Date Taking? Authorizing Provider  ALPRAZolam Prudy Feeler(XANAX) 0.5 MG tablet Take 0.5 mg by mouth 2 (two) times daily.   Yes Historical Provider, MD  aspirin EC 81 MG tablet Take 81 mg by mouth daily.   Yes Historical Provider, MD  atorvastatin (LIPITOR) 10 MG tablet Take 10 mg by mouth every morning.   Yes Historical Provider, MD  carvedilol (COREG) 25 MG tablet Take 25 mg by mouth 2 (two) times daily with a meal.   Yes Historical Provider, MD  glyBURIDE micronized (GLYNASE) 6 MG tablet Take 6 mg by mouth 2 (two) times daily before a meal.   Yes Historical Provider, MD  hydrochlorothiazide (HYDRODIURIL) 25 MG tablet Take 25 mg by mouth every morning.   Yes Historical Provider, MD  latanoprost (XALATAN) 0.005 % ophthalmic solution Place 1 drop into both eyes at bedtime.   Yes Historical Provider, MD  metFORMIN (GLUCOPHAGE) 500 MG tablet Take 500-1,000 mg  by mouth 3 (three) times daily. Takes 2 in the morning 1 at lunch and 2 at night   Yes Historical Provider, MD  nitroGLYCERIN (NITROSTAT) 0.4 MG SL tablet Place 0.4 mg under the tongue every 5 (five) minutes as needed for chest pain.   Yes Historical Provider, MD  pioglitazone (ACTOS) 45 MG tablet Take 45 mg by mouth daily.   Yes Historical Provider, MD  ramipril (ALTACE) 5 MG capsule Take 5 mg by mouth every morning.   Yes Historical Provider, MD  salmeterol (SEREVENT) 50 MCG/DOSE diskus inhaler Inhale 1 puff into the lungs 2  (two) times daily.   Yes Historical Provider, MD   Physical Exam: Filed Vitals:   03/25/14 1450 03/25/14 1500 03/25/14 1513 03/25/14 1515  BP: 164/65 159/63 159/63 190/81  Pulse: 96 94 105 102  Temp:      TempSrc:      Resp: 33 34 31 28  Height:      Weight:      SpO2: 99% 99% 100% 100%    Wt Readings from Last 3 Encounters:  03/25/14 53.524 kg (118 lb)  03/25/14 53.524 kg (118 lb)  11/17/12 54.432 kg (120 lb)    General: patient appears dyspneic with pallor, presently denies chest pain. She is awake and alert, calm cooperative following commands Eyes: PERRL, normal lids, irises & pale conjunctiva ENT: grossly normal hearing, lips & tongue Neck: no LAD, masses or thyromegaly Cardiovascular: tachycardic, RRR, no m/r/g. Has 2+ bilateral extremity pitting edema Telemetry: SR, no arrhythmias  Respiratory: CTA bilaterally, no w/r/r. Normal respiratory effort. Abdomen: soft, ntnd Skin: no rash or induration seen on limited exam Musculoskeletal: grossly normal tone BUE/BLE Psychiatric: grossly normal mood and affect, speech fluent and appropriate Neurologic: grossly non-focal.          Labs on Admission:  Basic Metabolic Panel:  Recent Labs Lab 03/25/14 0918 03/25/14 1430 03/25/14 1450  NA 132* 136* 136*  K 4.3 4.4 4.2  CL 97 92* 97  CO2 25 19  --   GLUCOSE 285* 296* 300*  BUN 49* 48* 49*  CREATININE 1.5* 1.39* 1.60*  CALCIUM 8.7 9.3  --    Liver Function Tests:  Recent Labs Lab 03/25/14 1430  AST 20  ALT 13  ALKPHOS 46  BILITOT 0.3  PROT 6.8  ALBUMIN 3.9   No results for input(s): LIPASE, AMYLASE in the last 168 hours. No results for input(s): AMMONIA in the last 168 hours. CBC:  Recent Labs Lab 03/25/14 0918 03/25/14 1430 03/25/14 1450  WBC 7.5 8.0  --   NEUTROABS 5.4  --   --   HGB 6.7 Repeated and verified X2.* 7.3* 9.2*  HCT 21.9 Repeated and verified X2.* 25.0* 27.0*  MCV 69.9 Repeated and verified X2.* 73.5*  --   PLT 287.0 313  --     Cardiac Enzymes: No results for input(s): CKTOTAL, CKMB, CKMBINDEX, TROPONINI in the last 168 hours.  BNP (last 3 results)  Recent Labs  03/25/14 0918  PROBNP 284.0*   CBG: No results for input(s): GLUCAP in the last 168 hours.  Radiological Exams on Admission: Dg Chest 2 View  03/25/2014   CLINICAL DATA:  63 year old with history of COPD and hypertension  EXAM: CHEST  2 VIEW  COMPARISON:  No recent comparison studies are available.  FINDINGS: The lungs are mildly hyperinflated.  The cardiac silhouette and mediastinal contours are within normal limits.  Atherosclerotic aortic calcifications are present.  There is no focal airspace consolidation,  pleural effusion or pneumothorax.  Mild degenerative changes of the spine are noted.  IMPRESSION: 1. Mild hyperinflation is compatible with history of COPD. 2. No focal airspace consolidation.   Electronically Signed   By: Fannie Knee   On: 03/25/2014 15:41    EKG: Independently reviewed. No acute ischemic changes  Assessment/Plan Principal Problem:   Chest pain Active Problems:   Essential hypertension   Hyperlipidemia   Diabetes mellitus due to underlying condition with circulatory complication   Coronary artery disease involving native coronary artery of native heart with unstable angina pectoris   Anemia   Absolute anemia   Microcytic anemia   1. Chest pain. Patient with history of coronary artery disease undergoing cardiac catheterization with percutaneous intervention in 2002, presenting with complaints of chest pain over the past month. This is likely secondary to demand ischemia in setting of anemia. Lab work showing a hemoglobin of 6.7. She will be typed and crossed and transfused with 2 units of packed red blood cells, place her on continuous cardiac monitoring, cycle cardiac enzymes and obtain a transthoracic echocardiogram. Will continue aspirin, Coreg, Lipitor, as needed nitroglycerin. She is presently chest  pain-free. 2. Microcytic anemia. Patient with history of iron deficiency anemia undergoing GI workup with EGD and colonoscopy in 2014 which did not show a source of bleeding. In the emergency room she was Hemoccult negative. She denies hematemesis, bloody stools and melena. Will further workup iron deficiency anemia with iron panel, ferritin, reticulocyte count, LDH, obtain a peripheral smear and repeat a stool for Guaiac in am.  3. History of coronary artery disease. Patient referred to the emergency department by her cardiologist Dr. Katrinka Blazing, as she presented with chest pain in setting of anemia. Chest pain likely secondary to demand ischemia. She'll be transfused 2 units of packed red blood cells, meanwhile will cycle cardiac enzymes, obtain transthoracic echocardiogram, place her on continuous cardiac monitoring. 4. Type 2 diabetes mellitus. Labs showing creatinine of 1.6, will hold oral hypoglycemics for now. Start Lantus 12 units subcutaneous daily at bedtime, sliding scale coverage with Accu-Cheks before every meal, titrate insulin as needed. 5. Hypertension. Patient hypertensive in the emergency room, will provide when necessary labetalol. Meanwhile continue Coreg 25 mg by mouth twice a day. Will hold ACE inhibitor for now given upward trend and a creatinine, plan to repeat BMP in a.m. 6. Dyslipidemia. Continue statin therapy.  7. DVT prophylaxis. SCDs   Code Status: full code Family Communication: I spoke with her husband who was present at bedside Disposition Plan: will admit patient to telemetry, anticipate she'll require greater than 2 nights hospitalization  Time spent: 70 minutes  Jeralyn Bennett [facility] Pager [phone]

## 2014-03-25 NOTE — ED Notes (Signed)
Spoke to admitting doctor.  Pt is appropriate for telemetry floor.

## 2014-03-25 NOTE — ED Notes (Signed)
Admitting doctor at the bedside 

## 2014-03-25 NOTE — Telephone Encounter (Signed)
Pt aware of critical hemoglobin lab of 6.7.pt aware of Dr.Smith instruction to go to [facility] ED right now.pt adv that Dr.Smith has called ahead and the staff is awaiting her arrival. Pt verbalized understanding.

## 2014-03-25 NOTE — ED Notes (Signed)
Pt transported to XR.  

## 2014-03-25 NOTE — ED Notes (Signed)
Admit Doctor at bedside.  

## 2014-03-25 NOTE — Progress Notes (Signed)
Patient ID: Pamela ShapeBrenda M [name], female   DOB: 10-29-1950, 63 y.o.   MRN: [id number]    [address] [place], [place]  [id number] Phone: [phone] Fax:  [phone]  Date:  03/25/2014   ID:  Pamela [name], DOB 10-29-1950, MRN [id number]  PCP:  No primary care provider on file.   ASSESSMENT:  1. Dyspnea probably mixed etiology 2. CAD with prior stent, with Class III angina pectoris 3. Hypertension, essential, controlled 4. Hyperlipidemia, on therapy 5. Severe COPD 6. Essential hypertension, controlled 7. Anemia, I deficiency, current status unknown  PLAN:  1. BNP and basic metabolic panel 2. CBC stat 3. May need coronary angiography if recurrent angina is related to obstructive coronary disease in absence of severe anemia 4. Further evaluation depending upon the laboratory data obtained. If severe anemia, will need admission and transfusion.   SUBJECTIVE: Pamela [name] is a 63 y.o. female who has a 2-3 month history of progressive dyspnea on exertion, aching substernal chest discomfort that can occur at rest, anorexia, and weight loss. She has history of coronary artery disease with a stent placed in 2002. The current chest discomfort is dissimilar to the pressure that she was having at the time the stent was placed. Within the past year she has been diagnosed with iron deficiency anemia and a workup is been done by Dr. Danise EdgeMartin Johnson. The findings on her own. In August she stopped taking iron. Over the past 4 weeks she has noted progressive dyspnea, weakness, and more chest discomfort. She has not noted melena, hematochezia, dysphagia, or hematemesis. She denies orthopnea. No prolonged palpitations.   Wt Readings from Last 3 Encounters:  03/25/14 118 lb (53.524 kg)  11/17/12 120 lb (54.432 kg)     Past Medical History  Diagnosis Date  . PONV (postoperative nausea and vomiting)   . Hypertension   . COPD (chronic obstructive pulmonary disease)   . Diabetes  mellitus without complication 10-29-12  . Coronary artery disease     Current Outpatient Prescriptions  Medication Sig Dispense Refill  . ALPRAZolam (XANAX) 0.5 MG tablet Take 0.5 mg by mouth 2 (two) times daily.    Marland Kitchen. aspirin EC 81 MG tablet Take 81 mg by mouth daily.    Marland Kitchen. atorvastatin (LIPITOR) 10 MG tablet Take 10 mg by mouth every morning.    . carvedilol (COREG) 25 MG tablet Take 25 mg by mouth 2 (two) times daily with a meal.    . glyBURIDE micronized (GLYNASE) 6 MG tablet Take 6 mg by mouth 2 (two) times daily before a meal.    . hydrochlorothiazide (HYDRODIURIL) 25 MG tablet Take 25 mg by mouth every morning.    . metFORMIN (GLUCOPHAGE) 500 MG tablet Take 500-1,000 mg by mouth 3 (three) times daily. Takes 2 in the morning 1 at lunch and 2 at night    . nitroGLYCERIN (NITROSTAT) 0.4 MG SL tablet Place 0.4 mg under the tongue every 5 (five) minutes as needed for chest pain.    . pioglitazone (ACTOS) 45 MG tablet Take 45 mg by mouth daily.    . ramipril (ALTACE) 5 MG capsule Take 5 mg by mouth every morning.    . salmeterol (SEREVENT) 50 MCG/DOSE diskus inhaler Inhale 1 puff into the lungs 2 (two) times daily.     No current facility-administered medications for this visit.    Allergies:   No Known Allergies  Social History:  The patient  reports that she quit smoking about  21 years ago. Her smoking use included Cigarettes. She smoked 1.50 packs per day. She does not have any smokeless tobacco history on file. She reports that she does not drink alcohol or use illicit drugs.   ROS:  Please see the history of present illness.   Complains of weight loss, dyspnea, anorexia, and edema..Denies transient neurological symptoms, claudication, syncope,   All other systems reviewed and negative.   OBJECTIVE: VS:  BP 140/90 mmHg  Pulse 92  Ht 5\' 1"  (1.549 m)  Wt 118 lb (53.524 kg)  BMI 22.31 kg/m2 Well nourished, well developed, in no acute distress, older than stated age and ill-appearing  with pallor HEENT: pale conjunctiva Neck: JVD elevated while lying at 30. Carotid bruit absent  Cardiac:  normal S1, S2; RRR; no murmur Lungs:  clear to auscultation bilaterally, no wheezing, rhonchi or rales Abd: soft, nontender, no hepatomegaly Ext: Edema 2+ bilateral ankles to mid shin. Pulses trace bilateral Skin: warm and dry Neuro:  CNs 2-12 intact, no focal abnormalities noted  EKG:  Sinus tachycardia with nonspecific ST abnormality.       Signed, Darci Needle III, MD 03/25/2014 8:55 AM

## 2014-03-25 NOTE — ED Notes (Addendum)
Pt reports doctor calling to inform her of hemoglobin of 6.8.  Pt told to come to ED for transfusion. Pt reports SOB but reports she has COPD. Lung sounds diminished. Pt denies cp.

## 2014-03-25 NOTE — ED Provider Notes (Signed)
CSN: [id number]     Arrival date & time 03/25/14  1340 History   First MD Initiated Contact with Patient 03/25/14 1409     Chief Complaint  Patient presents with  . Abnormal Lab     (Consider location/radiation/quality/duration/timing/severity/associated sxs/prior Treatment) The history is provided by the patient.  Pamela [name] is a 63 y.o. female hx of HTN, COPD, DM, CAD status post stent here with chest pain, anemia. She's been having intermittent chest pain for the last 2 weeks. It is substernal and worse with exertion. However sometimes she gets it at rest as well. Denies any fevers or chills. Feels tired and weak. Denies passing out. Denies abdominal pain or vomiting or blood in stool or melena. She was on iron supplementation but didn't get a refill since August. Went to [facility]D today and had a hemoglobin 6.7 so sent here for transfusion for symptomatic anemia. She is on ASA, not on plavix or blood thinners.    Past Medical History  Diagnosis Date  . PONV (postoperative nausea and vomiting)   . Hypertension   . COPD (chronic obstructive pulmonary disease)   . Diabetes mellitus without complication 10-29-12  . Coronary artery disease    Past Surgical History  Procedure Laterality Date  . Abdominal hysterectomy    . Elbow surgery Right     tendon surgery  . Tubal ligation    . Breast surgery      cyst removed  . Cholecystectomy      '74-open  . Appendectomy      '74- open with gallbladder  . Cardiac catheterization      1 coronary stent placed  . Ganglion cyst excision Bilateral 10-29-12    wrist  . Anal fissure repair    . Colonoscopy with propofol N/A 11/17/2012    Procedure: COLONOSCOPY WITH PROPOFOL;  Surgeon: Charolett Bumpers, MD;  Location: [facility];  Service: Endoscopy;  Laterality: N/A;  . Esophagogastroduodenoscopy (egd) with propofol N/A 11/17/2012    Procedure: ESOPHAGOGASTRODUODENOSCOPY (EGD) WITH PROPOFOL;  Surgeon: Charolett Bumpers, MD;  Location: [facility]  ENDOSCOPY;  Service: Endoscopy;  Laterality: N/A;   History reviewed. No pertinent family history. History  Substance Use Topics  . Smoking status: Former Smoker -- 1.50 packs/day    Types: Cigarettes    Quit date: 10/29/1992  . Smokeless tobacco: Not on file  . Alcohol Use: No   OB History    No data available     Review of Systems  Cardiovascular: Positive for chest pain.  Neurological: Positive for weakness.  All other systems reviewed and are negative.     Allergies  Review of patient's allergies indicates no known allergies.  Home Medications   Prior to Admission medications   Medication Sig Start Date End Date Taking? Authorizing Provider  ALPRAZolam Prudy Feeler) 0.5 MG tablet Take 0.5 mg by mouth 2 (two) times daily.    Historical Provider, MD  aspirin EC 81 MG tablet Take 81 mg by mouth daily.    Historical Provider, MD  atorvastatin (LIPITOR) 10 MG tablet Take 10 mg by mouth every morning.    Historical Provider, MD  carvedilol (COREG) 25 MG tablet Take 25 mg by mouth 2 (two) times daily with a meal.    Historical Provider, MD  glyBURIDE micronized (GLYNASE) 6 MG tablet Take 6 mg by mouth 2 (two) times daily before a meal.    Historical Provider, MD  hydrochlorothiazide (HYDRODIURIL) 25 MG tablet Take 25 mg by mouth every morning.  Historical Provider, MD  metFORMIN (GLUCOPHAGE) 500 MG tablet Take 500-1,000 mg by mouth 3 (three) times daily. Takes 2 in the morning 1 at lunch and 2 at night    Historical Provider, MD  nitroGLYCERIN (NITROSTAT) 0.4 MG SL tablet Place 0.4 mg under the tongue every 5 (five) minutes as needed for chest pain.    Historical Provider, MD  pioglitazone (ACTOS) 45 MG tablet Take 45 mg by mouth daily.    Historical Provider, MD  ramipril (ALTACE) 5 MG capsule Take 5 mg by mouth every morning.    Historical Provider, MD  salmeterol (SEREVENT) 50 MCG/DOSE diskus inhaler Inhale 1 puff into the lungs 2 (two) times daily.    Historical Provider, MD    BP 159/63 mmHg  Pulse 105  Temp(Src) 98.1 F (36.7 C) (Oral)  Resp 31  Ht 5' 1.5" (1.562 m)  Wt 118 lb (53.524 kg)  BMI 21.94 kg/m2  SpO2 100% Physical Exam  Constitutional: She is oriented to person, place, and time.  Chronically ill, pale   HENT:  Head: Normocephalic.  Mouth/Throat: Oropharynx is clear and moist.  Eyes: EOM are normal. Pupils are equal, round, and reactive to light.  Pale   Neck: Normal range of motion. Neck supple.  Cardiovascular: Normal rate, regular rhythm and normal heart sounds.   Pulmonary/Chest: Effort normal and breath sounds normal. No respiratory distress. She has no wheezes. She has no rales.  Abdominal: Soft. Bowel sounds are normal. She exhibits no distension. There is no tenderness. There is no rebound and no guarding.  Musculoskeletal: Normal range of motion. She exhibits no edema or tenderness.  Neurological: She is alert and oriented to person, place, and time. No cranial nerve deficit. Coordination normal.  Skin: Skin is warm and dry.  Psychiatric: She has a normal mood and affect. Her behavior is normal. Judgment and thought content normal.  Nursing note and vitals reviewed.   ED Course  Procedures (including critical care time)  CRITICAL CARE Performed by: Silverio Lay, Saranya Harlin   Total critical care time: 30 min   Critical care time was exclusive of separately billable procedures and treating other patients.  Critical care was necessary to treat or prevent imminent or life-threatening deterioration.  Critical care was time spent personally by me on the following activities: development of treatment plan with patient and/or surrogate as well as nursing, discussions with consultants, evaluation of patient's response to treatment, examination of patient, obtaining history from patient or surrogate, ordering and performing treatments and interventions, ordering and review of laboratory studies, ordering and review of radiographic studies, pulse  oximetry and re-evaluation of patient's condition.   Labs Review Labs Reviewed  CBC - Abnormal; Notable for the following:    RBC 3.40 (*)    Hemoglobin 7.3 (*)    HCT 25.0 (*)    MCV 73.5 (*)    MCH 21.5 (*)    MCHC 29.2 (*)    RDW 16.8 (*)    All other components within normal limits  COMPREHENSIVE METABOLIC PANEL - Abnormal; Notable for the following:    Sodium 136 (*)    Chloride 92 (*)    Glucose, Bld 296 (*)    BUN 48 (*)    Creatinine, Ser 1.39 (*)    GFR calc non Af Amer 39 (*)    GFR calc Af Amer 45 (*)    Anion gap 25 (*)    All other components within normal limits  I-STAT CHEM 8, ED - Abnormal; Notable  for the following:    Sodium 136 (*)    BUN 49 (*)    Creatinine, Ser 1.60 (*)    Glucose, Bld 300 (*)    Calcium, Ion 1.10 (*)    Hemoglobin 9.2 (*)    HCT 27.0 (*)    All other components within normal limits  I-STAT TROPOININ, ED  POC OCCULT BLOOD, ED  TYPE AND SCREEN  PREPARE RBC (CROSSMATCH)  ABO/RH    Imaging Review No results found.   EKG Interpretation   Date/Time:  Friday March 25 2014 14:39:20 EST Ventricular Rate:  99 PR Interval:  117 QRS Duration: 75 QT Interval:  354 QTC Calculation: 454 R Axis:   89 Text Interpretation:  Sinus rhythm Consider right atrial enlargement  Borderline right axis deviation Repol abnrm suggests ischemia, lateral  leads Baseline wander in lead(s) V4 V5 V6 No significant change since last  tracing Confirmed by Tivis Wherry  MD, Laelani Vasko ([id number]) on 03/25/2014 2:43:26 PM      MDM   Final diagnoses:  Chest pain    Pamela [name] is a 63 y.o. female here with chest pain, anemia. Concerned for unstable angina from anemia. Will get labs, type. Will likely need transfusion.   3:39 PM Repeat Hg 7.3. Occ neg. Trop neg. Ordered 1 U PRBC. Will admit to tele. i called cardiology, who recommend medical admission and transfuse and if not improved then can get cardiology consult.    Richardean Canalavid H Heberto Sturdevant, MD 03/25/14 [phone]

## 2014-03-26 DIAGNOSIS — D521 Drug-induced folate deficiency anemia: Secondary | ICD-10-CM

## 2014-03-26 DIAGNOSIS — I059 Rheumatic mitral valve disease, unspecified: Secondary | ICD-10-CM

## 2014-03-26 DIAGNOSIS — E0851 Diabetes mellitus due to underlying condition with diabetic peripheral angiopathy without gangrene: Secondary | ICD-10-CM

## 2014-03-26 LAB — CBC
HCT: 27.2 % — ABNORMAL LOW (ref 36.0–46.0)
HEMOGLOBIN: 8.5 g/dL — AB (ref 12.0–15.0)
MCH: 23.9 pg — ABNORMAL LOW (ref 26.0–34.0)
MCHC: 31.3 g/dL (ref 30.0–36.0)
MCV: 76.4 fL — ABNORMAL LOW (ref 78.0–100.0)
PLATELETS: 224 10*3/uL (ref 150–400)
RBC: 3.56 MIL/uL — AB (ref 3.87–5.11)
RDW: 17.4 % — ABNORMAL HIGH (ref 11.5–15.5)
WBC: 7.9 10*3/uL (ref 4.0–10.5)

## 2014-03-26 LAB — TYPE AND SCREEN
ABO/RH(D): A POS
ANTIBODY SCREEN: NEGATIVE
Unit division: 0
Unit division: 0

## 2014-03-26 LAB — BASIC METABOLIC PANEL
ANION GAP: 18 — AB (ref 5–15)
BUN: 47 mg/dL — ABNORMAL HIGH (ref 6–23)
CALCIUM: 8.5 mg/dL (ref 8.4–10.5)
CO2: 22 mEq/L (ref 19–32)
Chloride: 94 mEq/L — ABNORMAL LOW (ref 96–112)
Creatinine, Ser: 1.37 mg/dL — ABNORMAL HIGH (ref 0.50–1.10)
GFR calc Af Amer: 46 mL/min — ABNORMAL LOW (ref >90)
GFR calc non Af Amer: 40 mL/min — ABNORMAL LOW (ref >90)
GLUCOSE: 205 mg/dL — AB (ref 70–99)
POTASSIUM: 4.2 meq/L (ref 3.7–5.3)
SODIUM: 134 meq/L — AB (ref 137–147)

## 2014-03-26 LAB — GLUCOSE, CAPILLARY
GLUCOSE-CAPILLARY: 217 mg/dL — AB (ref 70–99)
Glucose-Capillary: 203 mg/dL — ABNORMAL HIGH (ref 70–99)
Glucose-Capillary: 294 mg/dL — ABNORMAL HIGH (ref 70–99)
Glucose-Capillary: 249 mg/dL — ABNORMAL HIGH (ref 70–99)

## 2014-03-26 LAB — TROPONIN I

## 2014-03-26 MED ORDER — INSULIN GLARGINE 100 UNIT/ML ~~LOC~~ SOLN
15.0000 [IU] | Freq: Every day | SUBCUTANEOUS | Status: DC
  Administered 2014-03-26 – 2014-03-27 (×2): 15 [IU] via SUBCUTANEOUS
  Filled 2014-03-26 (×3): qty 0.15

## 2014-03-26 MED ORDER — ISOSORBIDE DINITRATE 20 MG PO TABS
20.0000 mg | ORAL_TABLET | Freq: Three times a day (TID) | ORAL | Status: DC
  Administered 2014-03-26 – 2014-03-28 (×5): 20 mg via ORAL
  Filled 2014-03-26 (×7): qty 1

## 2014-03-26 MED ORDER — ISOSORBIDE DINITRATE 20 MG PO TABS
20.0000 mg | ORAL_TABLET | Freq: Three times a day (TID) | ORAL | Status: DC
  Filled 2014-03-26 (×3): qty 1

## 2014-03-26 NOTE — Progress Notes (Signed)
  Echocardiogram 2D Echocardiogram has been performed.  Arvil Chaco 03/26/2014, 3:26 PM

## 2014-03-26 NOTE — Progress Notes (Signed)
Pamela [name] [id number]:[id number] DOB: [birth year] DOA: 03/25/2014 PCP: No primary care provider on file.  Brief narrative: 59 ? h/o Cardiac cath 03/2002 with patent stent [placed 2002], normal colonoscopy 11/2102-EGd gastric erosions, Severe COPD, HLd, Htn, DM ty 2 admitted with Chest pain.  EKG no findings, Hb found to be 6.7.  Patient transfused 2 units packed red cells  Past medical history-As per Problem list Chart reviewed as below-   Consultants:    Procedures:    Antibiotics:     Subjective  Patient still feels poorly Still has some discomfort in the chest is fast for about 1 and half minutes coming and going She feels weak She has no nausea vomiting She is tolerating some diet She has no wheeze or any other issues    Objective    Interim History: reviewed  Telemetry: sinus  Objective: Filed Vitals:   03/26/14 0611 03/26/14 0756 03/26/14 0910 03/26/14 1040  BP: 166/77  166/64 140/58  Pulse: 93     Temp: 98.3 F (36.8 C)     TempSrc: Oral     Resp: 18     Height:      Weight:      SpO2: 95% 96%      Intake/Output Summary (Last 24 hours) at 03/26/14 1136 Last data filed at 03/26/14 0548  Gross per 24 hour  Intake   1132 ml  Output   1150 ml  Net    -18 ml    Exam:  General: alert and oriented no apparent distress Cardiovascular: S1-S2 no murmur rub Respiratory: clinically clear no added sound Abdomen: soft nontender no rebound Skinno lower extremity edema Neuroiintact  Data Reviewed: Basic Metabolic Panel:  Recent Labs Lab 03/25/14 0918 03/25/14 1430 03/25/14 1450 03/26/14 0538  NA 132* 136* 136* 134*  K 4.3 4.4 4.2 4.2  CL 97 92* 97 94*  CO2 25 19  --  22  GLUCOSE 285* 296* 300* 205*  BUN 49* 48* 49* 47*  CREATININE 1.5* 1.39* 1.60* 1.37*  CALCIUM 8.7 9.3  --  8.5   Liver Function Tests:  Recent Labs Lab 03/25/14 1430  AST 20  ALT 13  ALKPHOS 46  BILITOT 0.3  PROT 6.8  ALBUMIN 3.9   No results for input(s):  LIPASE, AMYLASE in the last 168 hours. No results for input(s): AMMONIA in the last 168 hours. CBC:  Recent Labs Lab 03/25/14 0918 03/25/14 1430 03/25/14 1450 03/26/14 0538  WBC 7.5 8.0  --  7.9  NEUTROABS 5.4  --   --   --   HGB 6.7 Repeated and verified X2.* 7.3* 9.2* 8.5*  HCT 21.9 Repeated and verified X2.* 25.0* 27.0* 27.2*  MCV 69.9 Repeated and verified X2.* 73.5*  --  76.4*  PLT 287.0 313  --  224   Cardiac Enzymes:  Recent Labs Lab 03/25/14 1700 03/26/14 0538  TROPONINI <0.30 <0.30   BNP: Invalid input(s): POCBNP CBG:  Recent Labs Lab 03/25/14 1657 03/25/14 2206 03/26/14 0754  GLUCAP 227* 124* 217*    No results found for this or any previous visit (from the past 240 hour(s)).   Studies:              All Imaging reviewed and is as per above notation   Scheduled Meds: . aspirin EC  81 mg Oral Daily  . atorvastatin  10 mg Oral q morning - 10a  . carvedilol  25 mg Oral BID WC  . insulin aspart  0-15  Units Subcutaneous TID WC  . insulin aspart  0-5 Units Subcutaneous QHS  . insulin glargine  12 Units Subcutaneous QHS  . isosorbide mononitrate  60 mg Oral Daily  . latanoprost  1 drop Both Eyes QHS  . salmeterol  1 puff Inhalation BID  . sodium chloride  3 mL Intravenous Q12H   Continuous Infusions:    Assessment/Plan: 1. Demand ischemia-probably secondary to low blood count as well as preexistent CAD 2002.  We await echocardiogram to see if there is any wall motion abnormalities. We will continue to cycle on EKG I do not think we need to do any other further workup at this time continue aspirin 81 daily. Have added Isordil 20 mg 3 times a day to see if this helps. Continue Coreg 25 twice a day 2. Symptomaticmicrocytic anemia--transfused 2 units packed red blood cells. We will continue to monitor blood count in the morning.  labs are pending 3. Type 2 diabetes mellitus-oral hypoglycemics on hold patient's started on Lantus sliding scale coverage.we will  increase Lantus to 15 units as sugars ranging 200-300. Glyburide 6 mg twice a day, 45 daily on hold 4. Compensated severe COPD-only on Serevent inhaler. Recommend adding long-acting anticholinergic as well. 5. Hypertension HCTZ 25 and rapid growth 5 on see above  Code Status: full Family Communication: discussed with husband at bedside Disposition Plan: inpatient   Pleas KochJai Keziah Avis, MD  [facility] Pager [phone] 03/26/2014, 11:36 AM    LOS: 1 day

## 2014-03-26 NOTE — Plan of Care (Signed)
Problem: Consults Goal: General Medical Patient Education See Patient Education Module for specific education.  Outcome: Completed/Met Date Met:  03/26/14  Problem: Phase I Progression Outcomes Goal: Pain controlled with appropriate interventions Outcome: Completed/Met Date Met:  03/26/14 Goal: OOB as tolerated unless otherwise ordered Outcome: Completed/Met Date Met:  03/26/14 Goal: Voiding-avoid urinary catheter unless indicated Outcome: Completed/Met Date Met:  03/26/14  Problem: Phase II Progression Outcomes Goal: Progress activity as tolerated unless otherwise ordered Outcome: Completed/Met Date Met:  03/26/14 Goal: IV changed to normal saline lock Outcome: Completed/Met Date Met:  03/26/14 Goal: Obtain order to discontinue catheter if appropriate Outcome: Not Applicable Date Met:  96/75/91  Problem: Phase III Progression Outcomes Goal: Pain controlled on oral analgesia Outcome: Not Applicable Date Met:  63/84/66 Goal: Voiding independently Outcome: Completed/Met Date Met:  03/26/14 Goal: Foley discontinued Outcome: Not Applicable Date Met:  59/93/57  Problem: Discharge Progression Outcomes Goal: Pain controlled with appropriate interventions Outcome: Completed/Met Date Met:  03/26/14

## 2014-03-26 NOTE — Progress Notes (Signed)
Nutrition Brief Note  Patient identified on the Malnutrition Screening Tool (MST) Report. Pr presents with chest pain/shortness of breath. Hx of DM , anemia.  Wt Readings from Last 15 Encounters:  03/25/14 118 lb (53.524 kg)  03/25/14 118 lb (53.524 kg)  11/17/12 120 lb (54.432 kg)    Body mass index is 21.94 kg/(m^2). Patient meets criteria for normal based on current BMI. No significant change in weight status or appetite.  Current diet order is [facility]  patient is consuming approximately 100% of meals at this time. Labs and medications reviewed.   No nutrition interventions warranted at this time. If nutrition issues arise, please consult RD.   Royann Shivers MS,RD,CSG,LDN Office: [phone] Pager: [phone]

## 2014-03-27 LAB — CBC WITH DIFFERENTIAL/PLATELET
BASOS PCT: 1 % (ref 0–1)
Basophils Absolute: 0 10*3/uL (ref 0.0–0.1)
EOS ABS: 0.3 10*3/uL (ref 0.0–0.7)
Eosinophils Relative: 4 % (ref 0–5)
HCT: 28 % — ABNORMAL LOW (ref 36.0–46.0)
HEMOGLOBIN: 8.9 g/dL — AB (ref 12.0–15.0)
Lymphocytes Relative: 21 % (ref 12–46)
Lymphs Abs: 1.4 10*3/uL (ref 0.7–4.0)
MCH: 24.1 pg — AB (ref 26.0–34.0)
MCHC: 31.8 g/dL (ref 30.0–36.0)
MCV: 75.9 fL — AB (ref 78.0–100.0)
MONO ABS: 0.7 10*3/uL (ref 0.1–1.0)
Monocytes Relative: 11 % (ref 3–12)
NEUTROS ABS: 4.2 10*3/uL (ref 1.7–7.7)
Neutrophils Relative %: 63 % (ref 43–77)
Platelets: 236 10*3/uL (ref 150–400)
RBC: 3.69 MIL/uL — ABNORMAL LOW (ref 3.87–5.11)
RDW: 17.9 % — ABNORMAL HIGH (ref 11.5–15.5)
WBC: 6.6 10*3/uL (ref 4.0–10.5)

## 2014-03-27 LAB — GLUCOSE, CAPILLARY
Glucose-Capillary: 128 mg/dL — ABNORMAL HIGH (ref 70–99)
Glucose-Capillary: 191 mg/dL — ABNORMAL HIGH (ref 70–99)
Glucose-Capillary: 215 mg/dL — ABNORMAL HIGH (ref 70–99)
Glucose-Capillary: 232 mg/dL — ABNORMAL HIGH (ref 70–99)

## 2014-03-27 MED ORDER — CARVEDILOL 25 MG PO TABS
25.0000 mg | ORAL_TABLET | Freq: Three times a day (TID) | ORAL | Status: DC
  Administered 2014-03-27 – 2014-03-28 (×3): 25 mg via ORAL
  Filled 2014-03-27 (×5): qty 1

## 2014-03-27 MED ORDER — POLYSACCHARIDE IRON COMPLEX 150 MG PO CAPS
150.0000 mg | ORAL_CAPSULE | Freq: Every day | ORAL | Status: DC
  Administered 2014-03-27 – 2014-03-28 (×2): 150 mg via ORAL
  Filled 2014-03-27 (×2): qty 1

## 2014-03-27 NOTE — Plan of Care (Signed)
Problem: Phase III Progression Outcomes Goal: Activity at appropriate level-compared to baseline (UP IN CHAIR FOR HEMODIALYSIS)  Outcome: Completed/Met Date Met:  03/27/14     

## 2014-03-27 NOTE — Progress Notes (Addendum)
Pamela [name] [id number] DOB: 1950/10/08 DOA: 03/25/2014 PCP: No primary care provider on file.  Brief narrative: 57 ? h/o Cardiac cath 03/2002 with patent stent [placed 2002], normal colonoscopy 11/2102-EGd gastric erosions, Severe COPD, HLd, Htn, DM ty 2 admitted with Chest pain.  EKG no findings, Hb found to be 6.7.  Patient transfused 2 units packed red cells  Past medical history-As per Problem list Chart reviewed as below-   Consultants:    Procedures:    Antibiotics:     Subjective  Patient sbetter Mild Ha No CP No SOb No n/v   Objective    Interim History: reviewed  Telemetry: sinus  Objective: Filed Vitals:   03/27/14 0612 03/27/14 0826 03/27/14 0831 03/27/14 1431  BP: 176/93  181/89 151/72  Pulse:    92  Temp: 98.4 F (36.9 C)   99.3 F (37.4 C)  TempSrc: Oral   Oral  Resp: 18   18  Height:      Weight:      SpO2: 99% 94% 94% 97%    Intake/Output Summary (Last 24 hours) at 03/27/14 1452 Last data filed at 03/27/14 2197  Gross per 24 hour  Intake    270 ml  Output   1950 ml  Net  -1680 ml    Exam:  General: alert and oriented no apparent distress Cardiovascular: S1-S2 no murmur rub Respiratory: clinically clear no added sound Abdomen: soft nontender no rebound   Data Reviewed: Basic Metabolic Panel:  Recent Labs Lab 03/25/14 0918 03/25/14 1430 03/25/14 1450 03/26/14 0538  NA 132* 136* 136* 134*  K 4.3 4.4 4.2 4.2  CL 97 92* 97 94*  CO2 25 19  --  22  GLUCOSE 285* 296* 300* 205*  BUN 49* 48* 49* 47*  CREATININE 1.5* 1.39* 1.60* 1.37*  CALCIUM 8.7 9.3  --  8.5   Liver Function Tests:  Recent Labs Lab 03/25/14 1430  AST 20  ALT 13  ALKPHOS 46  BILITOT 0.3  PROT 6.8  ALBUMIN 3.9   No results for input(s): LIPASE, AMYLASE in the last 168 hours. No results for input(s): AMMONIA in the last 168 hours. CBC:  Recent Labs Lab 03/25/14 0918 03/25/14 1430 03/25/14 1450 03/26/14 0538 03/27/14 0515  WBC  7.5 8.0  --  7.9 6.6  NEUTROABS 5.4  --   --   --  4.2  HGB 6.7 Repeated and verified X2.* 7.3* 9.2* 8.5* 8.9*  HCT 21.9 Repeated and verified X2.* 25.0* 27.0* 27.2* 28.0*  MCV 69.9 Repeated and verified X2.* 73.5*  --  76.4* 75.9*  PLT 287.0 313  --  224 236   Cardiac Enzymes:  Recent Labs Lab 03/25/14 1700 03/26/14 0538  TROPONINI <0.30 <0.30   BNP: Invalid input(s): POCBNP CBG:  Recent Labs Lab 03/26/14 1241 03/26/14 1723 03/26/14 2207 03/27/14 0829 03/27/14 1230  GLUCAP 203* 294* 249* 128* 191*    No results found for this or any previous visit (from the past 240 hour(s)).   Studies:              All Imaging reviewed and is as per above notation   Scheduled Meds: . aspirin EC  81 mg Oral Daily  . atorvastatin  10 mg Oral q morning - 10a  . carvedilol  25 mg Oral TID  . insulin aspart  0-15 Units Subcutaneous TID WC  . insulin aspart  0-5 Units Subcutaneous QHS  . insulin glargine  15 Units Subcutaneous QHS  .  isosorbide dinitrate  20 mg Oral TID  . latanoprost  1 drop Both Eyes QHS  . salmeterol  1 puff Inhalation BID  . sodium chloride  3 mL Intravenous Q12H   Continuous Infusions:    Assessment/Plan: 1. Demand ischemia-probably secondary to low blood count as well as preexistent CAD 2002.  ECHO=EF35-40%-d/w Dr. Margart Sicklesurbner and went over case. She feels patient can follow as OP for Myoview as per Dr. Katrinka BlazingSmith continue aspirin 81 daily. Have added Isordil 20 mg 3 times a day to see if this helps.  2. Symptomatic microcytic anemia--transfused 2 units packed red blood cells. Hb acceptable in 8-9 range.  Needs to re-start iron.  Heme indices indicate Iron deficiency 3. Type 2 diabetes mellitus-oral hypoglycemics on hold patient's started on Lantus sliding scale coverage.we will increase Lantus to 15 units as sugars ranging 200-300. 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 Compensated severe  COPD-only on Serevent inhaler. Recommend adding long-acting anticholinergic as well. 6. Hypertension HCTZ 25, Continue Coreg 25 and increased to tid on 11/15.  If needed will add hydralazine as well  Code Status: full Family Communication: discussed with husband at bedside Disposition Plan: inpatient-possible d/c in am   [name], MD  [facility] Pager [phone] 03/27/2014, 2:52 PM    LOS: 2 days

## 2014-03-28 DIAGNOSIS — D520 Dietary folate deficiency anemia: Secondary | ICD-10-CM

## 2014-03-28 DIAGNOSIS — E0859 Diabetes mellitus due to underlying condition with other circulatory complications: Secondary | ICD-10-CM

## 2014-03-28 LAB — CBC WITH DIFFERENTIAL/PLATELET
Basophils Absolute: 0 10*3/uL (ref 0.0–0.1)
Basophils Relative: 1 % (ref 0–1)
EOS PCT: 4 % (ref 0–5)
Eosinophils Absolute: 0.3 10*3/uL (ref 0.0–0.7)
HCT: 30.1 % — ABNORMAL LOW (ref 36.0–46.0)
Hemoglobin: 9.4 g/dL — ABNORMAL LOW (ref 12.0–15.0)
LYMPHS PCT: 17 % (ref 12–46)
Lymphs Abs: 1.1 10*3/uL (ref 0.7–4.0)
MCH: 23.9 pg — ABNORMAL LOW (ref 26.0–34.0)
MCHC: 31.2 g/dL (ref 30.0–36.0)
MCV: 76.4 fL — AB (ref 78.0–100.0)
Monocytes Absolute: 0.6 10*3/uL (ref 0.1–1.0)
Monocytes Relative: 10 % (ref 3–12)
NEUTROS PCT: 68 % (ref 43–77)
Neutro Abs: 4.3 10*3/uL (ref 1.7–7.7)
PLATELETS: 241 10*3/uL (ref 150–400)
RBC: 3.94 MIL/uL (ref 3.87–5.11)
RDW: 18.6 % — ABNORMAL HIGH (ref 11.5–15.5)
WBC: 6.3 10*3/uL (ref 4.0–10.5)

## 2014-03-28 LAB — GLUCOSE, CAPILLARY: GLUCOSE-CAPILLARY: 173 mg/dL — AB (ref 70–99)

## 2014-03-28 MED ORDER — ISOSORBIDE DINITRATE 20 MG PO TABS: 20.0000 mg | ORAL_TABLET | Freq: Three times a day (TID) | ORAL | Status: DC

## 2014-03-28 MED ORDER — POLYSACCHARIDE IRON COMPLEX 150 MG PO CAPS: 150.0000 mg | ORAL_CAPSULE | Freq: Every day | ORAL | Status: DC

## 2014-03-28 MED ORDER — CARVEDILOL 25 MG PO TABS: 25.0000 mg | ORAL_TABLET | Freq: Three times a day (TID) | ORAL | Status: DC

## 2014-03-28 NOTE — Progress Notes (Signed)
Utilization review complete. Tyreke Kaeser RN CCM Case Mgmt phone [phone] 

## 2014-03-28 NOTE — Progress Notes (Signed)
Maryanna Shape to be D/C'd Home per MD order.  Discussed with the patient and all questions fully answered.    Medication List    STOP taking these medications        pioglitazone 45 MG tablet  Commonly known as:  ACTOS     ramipril 5 MG capsule  Commonly known as:  ALTACE      TAKE these medications        ALPRAZolam 0.5 MG tablet  Commonly known as:  XANAX  Take 0.5 mg by mouth 2 (two) times daily.     aspirin EC 81 MG tablet  Take 81 mg by mouth daily.     atorvastatin 10 MG tablet  Commonly known as:  LIPITOR  Take 10 mg by mouth every morning.     carvedilol 25 MG tablet  Commonly known as:  COREG  Take 1 tablet (25 mg total) by mouth 3 (three) times daily.     glyBURIDE micronized 6 MG tablet  Commonly known as:  GLYNASE  Take 6 mg by mouth 2 (two) times daily before a meal.     hydrochlorothiazide 25 MG tablet  Commonly known as:  HYDRODIURIL  Take 25 mg by mouth every morning.     iron polysaccharides 150 MG capsule  Commonly known as:  NIFEREX  Take 1 capsule (150 mg total) by mouth daily.     isosorbide dinitrate 20 MG tablet  Commonly known as:  ISORDIL  Take 1 tablet (20 mg total) by mouth 3 (three) times daily.     latanoprost 0.005 % ophthalmic solution  Commonly known as:  XALATAN  Place 1 drop into both eyes at bedtime.     metFORMIN 500 MG tablet  Commonly known as:  GLUCOPHAGE  Take 500-1,000 mg by mouth 3 (three) times daily. Takes 2 in the morning 1 at lunch and 2 at night     nitroGLYCERIN 0.4 MG SL tablet  Commonly known as:  NITROSTAT  Place 0.4 mg under the tongue every 5 (five) minutes as needed for chest pain.     salmeterol 50 MCG/DOSE diskus inhaler  Commonly known as:  SEREVENT  Inhale 1 puff into the lungs 2 (two) times daily.        VVS, Skin clean, dry and intact without evidence of skin break down, no evidence of skin tears noted. IV catheter discontinued intact. Site without signs and symptoms of complications.  Dressing and pressure applied.  An After Visit Summary was printed and given to the patient.  D/c education completed with patient/family including follow up instructions, medication list, d/c activities limitations if indicated, with other d/c instructions as indicated by MD - patient able to verbalize understanding, all questions fully answered.   Patient instructed to return to ED, call 911, or call MD for any changes in condition.   Patient escorted via WC, and D/C home via private auto.  Aleksa Collinsworth D 03/28/2014 10:21 AM

## 2014-03-28 NOTE — Discharge Summary (Signed)
Physician Discharge Summary  Pamela [name] TMH:[id number] DOB: 1951/04/27 DOA: 03/25/2014  PCP: No primary care provider on file.  Admit date: 03/25/2014 Discharge date: 03/28/2014  Time spent: 35 minutes  Recommendations for Outpatient Follow-up:  1. New meds-Isordil 20 tid, 2. Meds d/c- Actos/Ramirpil 3. Consider addition hydralazine to meds 4. Consider Stress test as OP 5. Repeat Retic and CBC in 1 week 6. 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 h/o Cardiac cath 03/2002 with patent stent [placed 2002], normal colonoscopy 11/2102-EGd gastric erosions, Severe COPD, HLd, Htn, DM ty 2 admitted with Chest pain. EKG no findings, Hb found to be 6.7. Patient transfused 2 units packed red cells  Hospital Course:   1. Demand ischemia-probably secondary to low blood count as well as preexistent CAD 2002. ECHO=EF35-40%-d/w Dr. Margart Sickles and went over case. She feels patient can follow as OP for Myoview as per Dr. Katrinka Blazing continue aspirin 81 daily. Have added Isordil 20 mg 3 times a day to see if this helps. SHe mentions addition maybe hydralazine which I will holfd off of for now.  Want to see effects Isordil over next week.  Can start in PCP office if needed 2. Symptomatic microcytic anemia--transfused 2 units packed red blood cells. Hb acceptable in 8-9 range. Needs to re-start iron. Heme indices indicate Iron deficiency-started Nu-Iron in Hosptial.   Was told to hold off of Iron erroneously i believe, as patient was told this can cause LE swelling-see below 3. Type 2 diabetes mellitus-oral hypoglycemics on hold patient's started on Lantus sliding scale coverage.we will increase Lantus to 15 units as sugars ranging 200-300. Glyburide 6 mg twice a day, Actos 45 daily on hold.  I have completely d/c Actos as she might have had fluid gain from this.  Side effect profileis high.  I have encouraged her to discusss with her PCP 4. 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 Compensated severe COPD-only on Serevent inhaler. Recommend adding long-acting anticholinergic as well to be added as OP 6. Hypertension HCTZ 25, Continue Coreg 25 and increased to tid on 11/15. BP not at goal but conisder wither BIdial or addition hydralazine as OP  Code Status: full  Procedures:  ECHO 11/14  - Left ventricle: The cavity size was normal. Wall thickness was normal. Systolic function was moderately reduced. The estimated ejection fraction was in the range of 35% to 40%. Diffuse hypokinesis. Doppler parameters are consistent with abnormal left ventricular relaxation (grade 1 diastolic dysfunction). - Mitral valve: There was mild regurgitation. - Pericardium, extracardiac: A trivial pericardial effusion was identified posterior to the heart. (i.e. Studies not automatically included, echos, thoracentesis, etc; not x-rays)  Consultations:  Telephonically discussed with Dr. Mayford Knife 11/15  Discharge Exam: Filed Vitals:   03/28/14 0514  BP: 157/72  Pulse: 83  Temp: 99 F (37.2 C)  Resp: 18    General: alert oriented, in NAD Cardiovascular: s1 s2 no m/r/g Respiratory: clear  Discharge Instructions You were cared for by a hospitalist during your hospital stay. If you have any questions about your discharge medications or the care you received while you were in the hospital after you are discharged, you can call the unit and  asked to speak with the hospitalist on call if the hospitalist that took care of you is not available. Once you are discharged, your primary care physician will handle any further medical issues. Please note that NO REFILLS for any discharge medications will be authorized once you are discharged, as it is imperative that you return to your primary care physician (or establish a relationship with a primary care physician if you do not have one) for your aftercare needs so that they can reassess your need for medications and monitor your lab values.  Discharge Instructions    Diet - low sodium heart healthy    Complete by:  As directed      Discharge instructions    Complete by:  As directed   Take Isordil 3 times a day Continue Iron I have discontinued your actos, and Ramipril-because of kidney dysfucntion as well as Lower extremity edema. Please follow woth Dr. Clydie Braun schooler and Dr. Mendel Ryder soon     Increase activity slowly    Complete by:  As directed           Current Discharge Medication List    START taking these medications   Details  iron polysaccharides (NIFEREX) 150 MG capsule Take 1 capsule (150 mg total) by mouth daily. Qty: 30 capsule, Refills: 0    isosorbide dinitrate (ISORDIL) 20 MG tablet Take 1 tablet (20 mg total) by mouth 3 (three) times daily. Qty: 90 tablet, Refills: 0      CONTINUE these medications which have NOT CHANGED   Details  ALPRAZolam (XANAX) 0.5 MG tablet Take 0.5 mg by mouth 2 (two) times daily.    aspirin EC 81 MG tablet Take 81 mg by mouth daily.    atorvastatin (LIPITOR) 10 MG tablet Take 10 mg by mouth every morning.    carvedilol (COREG) 25 MG tablet Take 25 mg by mouth 2 (two) times daily with a meal.    glyBURIDE micronized (GLYNASE) 6 MG tablet Take 6 mg by mouth 2 (two) times daily before a meal.    hydrochlorothiazide (HYDRODIURIL) 25 MG tablet Take 25 mg by mouth every morning.    latanoprost (XALATAN) 0.005 % ophthalmic solution  Place 1 drop into both eyes at bedtime.    metFORMIN (GLUCOPHAGE) 500 MG tablet Take 500-1,000 mg by mouth 3 (three) times daily. Takes 2 in the morning 1 at lunch and 2 at night    nitroGLYCERIN (NITROSTAT) 0.4 MG SL tablet Place 0.4 mg under the tongue every 5 (five) minutes as needed for chest pain.    salmeterol (SEREVENT) 50 MCG/DOSE diskus inhaler Inhale 1 puff into the lungs 2 (two) times daily.      STOP taking these medications     pioglitazone (ACTOS) 45 MG tablet      ramipril (ALTACE) 5 MG capsule        No Known Allergies    The results of significant diagnostics from this hospitalization (including imaging, microbiology, ancillary and laboratory) are listed below for reference.    Significant Diagnostic Studies: Dg Chest 2 View  03/25/2014   CLINICAL DATA:  63 year old with history of COPD and hypertension  EXAM: CHEST  2 VIEW  COMPARISON:  No recent comparison studies are available.  FINDINGS: The lungs are mildly hyperinflated.  The cardiac silhouette and mediastinal contours are within normal limits.  Atherosclerotic aortic calcifications are present.  There is no focal airspace consolidation, pleural effusion or pneumothorax.  Mild degenerative changes of the spine are noted.  IMPRESSION: 1. Mild hyperinflation is compatible with history of COPD. 2. No focal airspace consolidation.   Electronically Signed   By: Fannie KneeKenneth  Crosby   On: 03/25/2014 15:41    Microbiology: No results found for this or any previous visit (from the past 240 hour(s)).   Labs: Basic Metabolic Panel:  Recent Labs Lab 03/25/14 0918 03/25/14 1430 03/25/14 1450 03/26/14 0538  NA 132* 136* 136* 134*  K 4.3 4.4 4.2 4.2  CL 97 92* 97 94*  CO2 25 19  --  22  GLUCOSE 285* 296* 300* 205*  BUN 49* 48* 49* 47*  CREATININE 1.5* 1.39* 1.60* 1.37*  CALCIUM 8.7 9.3  --  8.5   Liver Function Tests:  Recent Labs Lab 03/25/14 1430  AST 20  ALT 13  ALKPHOS 46  BILITOT 0.3  PROT 6.8   ALBUMIN 3.9   No results for input(s): LIPASE, AMYLASE in the last 168 hours. No results for input(s): AMMONIA in the last 168 hours. CBC:  Recent Labs Lab 03/25/14 0918 03/25/14 1430 03/25/14 1450 03/26/14 0538 03/27/14 0515 03/28/14 0525  WBC 7.5 8.0  --  7.9 6.6 6.3  NEUTROABS 5.4  --   --   --  4.2 4.3  HGB 6.7 Repeated and verified X2.* 7.3* 9.2* 8.5* 8.9* 9.4*  HCT 21.9 Repeated and verified X2.* 25.0* 27.0* 27.2* 28.0* 30.1*  MCV 69.9 Repeated and verified X2.* 73.5*  --  76.4* 75.9* 76.4*  PLT 287.0 313  --  224 236 241   Cardiac Enzymes:  Recent Labs Lab 03/25/14 1700 03/26/14 0538  TROPONINI <0.30 <0.30   BNP: BNP (last 3 results)  Recent Labs  03/25/14 0918  PROBNP 284.0*   CBG:  Recent Labs Lab 03/27/14 0829 03/27/14 1230 03/27/14 1709 03/27/14 2117 03/28/14 0744  GLUCAP 128* 191* 215* 232* 173*       Signed:  Rhetta MuraSAMTANI, JAI-GURMUKH  [facility] 03/28/2014, 9:23 AM

## 2014-03-30 ENCOUNTER — Telehealth: Payer: Self-pay | Admitting: Interventional Cardiology

## 2014-03-30 NOTE — Telephone Encounter (Signed)
New problem   Pt was discharged from hospital 03/28/14 and was told to f/u in 1-2wk with Dr Katrinka Blazing. Please advise pt's spouse.

## 2014-04-04 ENCOUNTER — Telehealth: Payer: Self-pay | Admitting: Interventional Cardiology

## 2014-04-04 NOTE — Telephone Encounter (Signed)
Pt to call back after leaving hospital, she was dc 11-16, pls advise

## 2014-04-05 NOTE — Telephone Encounter (Signed)
Pt husband aware of post hosp f/u with Dr.Smith for 11/30 @ 10am

## 2014-04-11 ENCOUNTER — Encounter: Payer: Self-pay | Admitting: Interventional Cardiology

## 2014-04-11 ENCOUNTER — Ambulatory Visit (INDEPENDENT_AMBULATORY_CARE_PROVIDER_SITE_OTHER): Payer: Medicare Other | Admitting: Interventional Cardiology

## 2014-04-11 VITALS — BP 188/96 | HR 103 | Ht 61.5 in | Wt 114.0 lb

## 2014-04-11 DIAGNOSIS — D521 Drug-induced folate deficiency anemia: Secondary | ICD-10-CM

## 2014-04-11 DIAGNOSIS — I10 Essential (primary) hypertension: Secondary | ICD-10-CM

## 2014-04-11 DIAGNOSIS — E0859 Diabetes mellitus due to underlying condition with other circulatory complications: Secondary | ICD-10-CM

## 2014-04-11 DIAGNOSIS — I25110 Atherosclerotic heart disease of native coronary artery with unstable angina pectoris: Secondary | ICD-10-CM

## 2014-04-11 LAB — CBC WITH DIFFERENTIAL/PLATELET
BASOS PCT: 0.6 % (ref 0.0–3.0)
Basophils Absolute: 0 10*3/uL (ref 0.0–0.1)
Eosinophils Absolute: 0.2 10*3/uL (ref 0.0–0.7)
Eosinophils Relative: 3.3 % (ref 0.0–5.0)
HCT: 32.5 % — ABNORMAL LOW (ref 36.0–46.0)
Hemoglobin: 10.1 g/dL — ABNORMAL LOW (ref 12.0–15.0)
LYMPHS PCT: 20.4 % (ref 12.0–46.0)
Lymphs Abs: 1.4 10*3/uL (ref 0.7–4.0)
MCHC: 31.2 g/dL (ref 30.0–36.0)
MCV: 76.9 fl — ABNORMAL LOW (ref 78.0–100.0)
Monocytes Absolute: 0.3 10*3/uL (ref 0.1–1.0)
Monocytes Relative: 5 % (ref 3.0–12.0)
NEUTROS PCT: 70.7 % (ref 43.0–77.0)
Neutro Abs: 4.8 10*3/uL (ref 1.4–7.7)
Platelets: 279 10*3/uL (ref 150.0–400.0)
RBC: 4.22 Mil/uL (ref 3.87–5.11)
RDW: 22.7 % — ABNORMAL HIGH (ref 11.5–15.5)
WBC: 6.8 10*3/uL (ref 4.0–10.5)

## 2014-04-11 LAB — BASIC METABOLIC PANEL
BUN: 36 mg/dL — ABNORMAL HIGH (ref 6–23)
CALCIUM: 8.6 mg/dL (ref 8.4–10.5)
CO2: 21 mEq/L (ref 19–32)
Chloride: 96 mEq/L (ref 96–112)
Creatinine, Ser: 1.4 mg/dL — ABNORMAL HIGH (ref 0.4–1.2)
GFR: 40.89 mL/min — AB (ref >60.00)
Glucose, Bld: 248 mg/dL — ABNORMAL HIGH (ref 70–99)
Potassium: 4.2 mEq/L (ref 3.5–5.1)
Sodium: 135 mEq/L (ref 135–145)

## 2014-04-11 MED ORDER — AMLODIPINE BESYLATE 5 MG PO TABS: 5.0000 mg | ORAL_TABLET | Freq: Every day | ORAL | Status: DC

## 2014-04-11 NOTE — Progress Notes (Signed)
Patient ID: Pamela [name], female   DOB: 1951/04/23, 63 y.o.   MRN: [id number]    [address] [place], [place]  [address] Phone: [phone] Fax:  [phone]  Date:  04/11/2014   ID:  Tran, Farish Jun 21, 1950, MRN [id number]  PCP:  No primary care provider on file.   ASSESSMENT:  1. Coronary atherosclerosis with continued left chest and arm pain occurring both with exertion and at rest, despite treatment of severe anemia 2. Anemia with recent transfusion 3. Significant elevation in blood pressure 4. COPD  PLAN:  1. Basic metabolic panel, CBC, as precath data  2. Coronary angiography to define anatomy assuming that hemoglobin is still greater than 90 3. Amlodipine 5 mg per day 4. Diagnostic coronary angiography with possible PCI via the radial approach was described to the patient and husband. The procedure including the risk of stroke, death, myocardial infarction, kidney injury, limb ischemia, among other potential palpitations were discussed in detail except above the patient.     SUBJECTIVE: Pamela [name] is a 63 y.o. female who was recently hospitalized 2 weeks ago with severe anemia and hemoglobin less than 7. She received 2 units of blood. Despite transfusion to hemoglobin greater than 9 she has continued to have vague left chest discomfort with radiation to the left arm. The discomfort has been occurring both at rest and with exertion. Episodes last up to 5-10 minutes. She had no discomfort today while coming into the office. Her medications were adjusted while hospitalized and included discontinuing Ramipril. She was started on isosorbide. No improvement in chest discomfort has occurred.  Wt Readings from Last 3 Encounters:  04/11/14 114 lb (51.71 kg)  03/25/14 118 lb (53.524 kg)  03/25/14 118 lb (53.524 kg)    Past Surgical History  Procedure Laterality Date  . Abdominal hysterectomy    . Elbow surgery Right     tendon surgery  . Tubal  ligation    . Breast surgery      cyst removed  . Cholecystectomy      '74-open  . Appendectomy      '74- open with gallbladder  . Cardiac catheterization      1 coronary stent placed  . Ganglion cyst excision Bilateral 10-29-12    wrist  . Anal fissure repair    . Colonoscopy with propofol N/A 11/17/2012    Procedure: COLONOSCOPY WITH PROPOFOL;  Surgeon: Charolett Bumpers, MD;  Location: [facility];  Service: Endoscopy;  Laterality: N/A;  . Esophagogastroduodenoscopy (egd) with propofol N/A 11/17/2012    Procedure: ESOPHAGOGASTRODUODENOSCOPY (EGD) WITH PROPOFOL;  Surgeon: Charolett Bumpers, MD;  Location: [facility];  Service: Endoscopy;  Laterality: N/A;    Past Medical History  Diagnosis Date  . PONV (postoperative nausea and vomiting)   . Hypertension   . COPD (chronic obstructive pulmonary disease)   . Diabetes mellitus without complication 10-29-12  . Coronary artery disease     Current Outpatient Prescriptions  Medication Sig Dispense Refill  . ALPRAZolam (XANAX) 0.5 MG tablet Take 0.5 mg by mouth 2 (two) times daily.    Marland Kitchen aspirin EC 81 MG tablet Take 81 mg by mouth daily.    Marland Kitchen atorvastatin (LIPITOR) 10 MG tablet Take 10 mg by mouth every morning.    . carvedilol (COREG) 25 MG tablet Take 1 tablet (25 mg total) by mouth 3 (three) times daily. 90 tablet 0  . glyBURIDE micronized (GLYNASE) 6 MG tablet Take 6 mg by  mouth 2 (two) times daily before a meal.    . hydrochlorothiazide (HYDRODIURIL) 25 MG tablet Take 25 mg by mouth every morning.    . iron polysaccharides (NIFEREX) 150 MG capsule Take 1 capsule (150 mg total) by mouth daily. 30 capsule 0  . isosorbide dinitrate (ISORDIL) 20 MG tablet Take 1 tablet (20 mg total) by mouth 3 (three) times daily. 90 tablet 0  . latanoprost (XALATAN) 0.005 % ophthalmic solution Place 1 drop into both eyes at bedtime.    . metFORMIN (GLUCOPHAGE) 500 MG tablet Take 500-1,000 mg by mouth 3 (three) times daily. Takes 2 in the morning 1 at  lunch and 2 at night    . nitroGLYCERIN (NITROSTAT) 0.4 MG SL tablet Place 0.4 mg under the tongue every 5 (five) minutes as needed for chest pain.    . salmeterol (SEREVENT) 50 MCG/DOSE diskus inhaler Inhale 1 puff into the lungs 2 (two) times daily.     No current facility-administered medications for this visit.    Allergies:   No Known Allergies  Social History:  The patient  reports that she quit smoking about 21 years ago. Her smoking use included Cigarettes. She smoked 1.50 packs per day. She does not have any smokeless tobacco history on file. She reports that she does not drink alcohol or use illicit drugs.   ROS:  Please see the history of present illness.   Denies claudication. No episodes of syncope. No melena or other GI complaints. Appetite is poor. Weight loss has been gradual and progressive.   All other systems reviewed and negative.   OBJECTIVE: VS:  BP 188/96 mmHg  Pulse 103  Ht 5' 1.5" (1.562 m)  Wt 114 lb (51.71 kg)  BMI 21.19 kg/m2 Well nourished, well developed, in no acute distress, appears pale and older than stated age  HEENT: normal Neck: JVD flat. Carotid bruit absent  Cardiac:  normal S1, S2; RRR; no murmur Lungs:  clear to auscultation bilaterally, no wheezing, rhonchi or rales Abd: soft, nontender, no hepatomegaly Ext: Edema had 2+ bilateral edema to the midshin region. Pulses 2+ radials 2+ femorals  Skin: warm and dryAn pale  Neuro:  CNs 2-12 intact, no focal abnormalities noted  EKG:  Sinus tachycardia with vertical axis       Signed, Darci NeedleHenry W. B. Smith III, MD 04/11/2014 10:21 AM

## 2014-04-11 NOTE — Patient Instructions (Addendum)
Your physician has recommended you make the following change in your medication:  1) START Amlodipine 5mg  daily. An Rx has been sent to your pharmacy Take all other medications as prescribed   Labs Today: Cbc, Bmet  Your physician has requested that you have a cardiac catheterization. Cardiac catheterization is used to diagnose and/or treat various heart conditions. Doctors may recommend this procedure for a number of different reasons. The most common reason is to evaluate chest pain. Chest pain can be a symptom of coronary artery disease (CAD), and cardiac catheterization can show whether plaque is narrowing or blocking your heart's arteries. This procedure is also used to evaluate the valves, as well as measure the blood flow and oxygen levels in different parts of your heart. For further information please visit [email or web address] Please follow instruction sheet, as given.

## 2014-04-12 ENCOUNTER — Encounter (HOSPITAL_COMMUNITY): Payer: Self-pay | Admitting: Pharmacy Technician

## 2014-04-12 NOTE — Telephone Encounter (Signed)
Follow up  Pt called to discuss her Cath.. Requesting a call back to discuss blood work//sr

## 2014-04-12 NOTE — Telephone Encounter (Signed)
Pt is scheduled for a Cardiac cath on 04/14/14. Pt called to find out about her blood work. She states that if her CBC results were low she may not have the cath. Pt is aware  Dr. Katrinka Blazing will review  Results and make recommendations. We will let her know when he does. Pt verbalized understanding. Pt's Hemoglobin today is 10.1, HCT 32.5.

## 2014-04-13 ENCOUNTER — Other Ambulatory Visit: Payer: Self-pay | Admitting: Interventional Cardiology

## 2014-04-13 ENCOUNTER — Telehealth: Payer: Self-pay

## 2014-04-13 DIAGNOSIS — I5022 Chronic systolic (congestive) heart failure: Secondary | ICD-10-CM

## 2014-04-13 DIAGNOSIS — Z8679 Personal history of other diseases of the circulatory system: Secondary | ICD-10-CM

## 2014-04-13 NOTE — Telephone Encounter (Signed)
Pt aware of Dr.Smith recommendations with verbal understanding. Let the patient know that her blood work is improved. Hemoglobin is above 10. We should proceed with heart catheterization. May not be able to perform stent until we know why she has iron deficiency anemia. If we place a stent and there is an area prone to bleed, it would complicate management because blood thinners would need to be stopped and the stent could clot.

## 2014-04-13 NOTE — Telephone Encounter (Signed)
Let the patient know that her blood work is improved. Hemoglobin is above 10. We should proceed with heart catheterization. May not be able to perform stent until we know why she has iron deficiency anemia. If we place a stent and there is an area prone to bleed, it would complicate management because blood thinners would need to be stopped and the stent could clot.

## 2014-04-14 ENCOUNTER — Encounter (HOSPITAL_COMMUNITY)
Admission: RE | Disposition: A | Discharge status: Home / Self Care | Payer: Self-pay | Source: Ambulatory Visit | Attending: Interventional Cardiology

## 2014-04-14 ENCOUNTER — Ambulatory Visit (HOSPITAL_COMMUNITY)
Admission: RE | Admit: 2014-04-14 | Discharge: 2014-04-14 | Disposition: A | Discharge status: Home / Self Care | Payer: Medicare Other | Source: Ambulatory Visit | Attending: Interventional Cardiology | Admitting: Interventional Cardiology

## 2014-04-14 DIAGNOSIS — Z7982 Long term (current) use of aspirin: Secondary | ICD-10-CM | POA: Insufficient documentation

## 2014-04-14 DIAGNOSIS — E119 Type 2 diabetes mellitus without complications: Secondary | ICD-10-CM | POA: Insufficient documentation

## 2014-04-14 DIAGNOSIS — I10 Essential (primary) hypertension: Secondary | ICD-10-CM | POA: Diagnosis not present

## 2014-04-14 DIAGNOSIS — D649 Anemia, unspecified: Secondary | ICD-10-CM | POA: Diagnosis not present

## 2014-04-14 DIAGNOSIS — I2510 Atherosclerotic heart disease of native coronary artery without angina pectoris: Secondary | ICD-10-CM | POA: Diagnosis not present

## 2014-04-14 DIAGNOSIS — Z87891 Personal history of nicotine dependence: Secondary | ICD-10-CM | POA: Diagnosis not present

## 2014-04-14 DIAGNOSIS — Z8679 Personal history of other diseases of the circulatory system: Secondary | ICD-10-CM

## 2014-04-14 DIAGNOSIS — D509 Iron deficiency anemia, unspecified: Secondary | ICD-10-CM | POA: Diagnosis present

## 2014-04-14 DIAGNOSIS — I25119 Atherosclerotic heart disease of native coronary artery with unspecified angina pectoris: Secondary | ICD-10-CM | POA: Insufficient documentation

## 2014-04-14 DIAGNOSIS — J449 Chronic obstructive pulmonary disease, unspecified: Secondary | ICD-10-CM | POA: Diagnosis not present

## 2014-04-14 DIAGNOSIS — I25118 Atherosclerotic heart disease of native coronary artery with other forms of angina pectoris: Secondary | ICD-10-CM

## 2014-04-14 HISTORY — PX: LEFT HEART CATHETERIZATION WITH CORONARY ANGIOGRAM: SHX5451

## 2014-04-14 LAB — GLUCOSE, CAPILLARY
Glucose-Capillary: 218 mg/dL — ABNORMAL HIGH (ref 70–99)
Glucose-Capillary: 249 mg/dL — ABNORMAL HIGH (ref 70–99)
Glucose-Capillary: 267 mg/dL — ABNORMAL HIGH (ref 70–99)

## 2014-04-14 LAB — PROTIME-INR
INR: 0.98 (ref 0.00–1.49)
PROTHROMBIN TIME: 13.1 s (ref 11.6–15.2)

## 2014-04-14 SURGERY — LEFT HEART CATHETERIZATION WITH CORONARY ANGIOGRAM
Anesthesia: LOCAL

## 2014-04-14 MED ORDER — ACETAMINOPHEN 325 MG PO TABS: 650.0000 mg | ORAL_TABLET | ORAL | Status: DC | PRN

## 2014-04-14 MED ORDER — FUROSEMIDE 40 MG PO TABS: 40.0000 mg | ORAL_TABLET | Freq: Every day | ORAL | Status: DC

## 2014-04-14 MED ORDER — FENTANYL CITRATE 0.05 MG/ML IJ SOLN
INTRAMUSCULAR | Status: AC
  Filled 2014-04-14: qty 2

## 2014-04-14 MED ORDER — VERAPAMIL HCL 2.5 MG/ML IV SOLN
INTRAVENOUS | Status: AC
  Filled 2014-04-14: qty 2

## 2014-04-14 MED ORDER — LISINOPRIL 10 MG PO TABS: 10.0000 mg | ORAL_TABLET | Freq: Every day | ORAL | Status: DC

## 2014-04-14 MED ORDER — MIDAZOLAM HCL 2 MG/2ML IJ SOLN
INTRAMUSCULAR | Status: AC
  Filled 2014-04-14: qty 2

## 2014-04-14 MED ORDER — SODIUM CHLORIDE 0.9 % IJ SOLN: 3.0000 mL | Freq: Two times a day (BID) | INTRAMUSCULAR | Status: DC

## 2014-04-14 MED ORDER — OXYCODONE-ACETAMINOPHEN 5-325 MG PO TABS: 1.0000 | ORAL_TABLET | ORAL | Status: DC | PRN

## 2014-04-14 MED ORDER — SODIUM CHLORIDE 0.9 % IV SOLN: 250.0000 mL | INTRAVENOUS | Status: DC | PRN

## 2014-04-14 MED ORDER — SODIUM CHLORIDE 0.9 % IJ SOLN
3.0000 mL | INTRAMUSCULAR | Status: DC | PRN
Start: 2014-04-14 — End: 2014-04-14

## 2014-04-14 MED ORDER — ONDANSETRON HCL 4 MG/2ML IJ SOLN: 4.0000 mg | Freq: Four times a day (QID) | INTRAMUSCULAR | Status: DC | PRN

## 2014-04-14 MED ORDER — NITROGLYCERIN 1 MG/10 ML FOR IR/CATH LAB
INTRA_ARTERIAL | Status: AC
  Filled 2014-04-14: qty 10

## 2014-04-14 MED ORDER — SODIUM CHLORIDE 0.9 % IV SOLN
INTRAVENOUS | Status: DC
  Administered 2014-04-14: 07:00:00 via INTRAVENOUS

## 2014-04-14 MED ORDER — HEPARIN (PORCINE) IN NACL 2-0.9 UNIT/ML-% IJ SOLN
INTRAMUSCULAR | Status: AC
  Filled 2014-04-14: qty 1000

## 2014-04-14 MED ORDER — FUROSEMIDE 40 MG PO TABS
40.0000 mg | ORAL_TABLET | Freq: Every day | ORAL | Status: DC
Start: 2014-04-15 — End: 2014-04-14

## 2014-04-14 MED ORDER — ASPIRIN 81 MG PO CHEW
CHEWABLE_TABLET | ORAL | Status: AC
  Filled 2014-04-14: qty 1

## 2014-04-14 MED ORDER — SODIUM CHLORIDE 0.9 % IV SOLN: INTRAVENOUS | Status: AC

## 2014-04-14 MED ORDER — FUROSEMIDE 10 MG/ML IJ SOLN
INTRAMUSCULAR | Status: AC
  Filled 2014-04-14: qty 4

## 2014-04-14 MED ORDER — ONDANSETRON HCL 4 MG/2ML IJ SOLN
INTRAMUSCULAR | Status: AC
  Filled 2014-04-14: qty 2

## 2014-04-14 MED ORDER — LIDOCAINE HCL (PF) 1 % IJ SOLN
INTRAMUSCULAR | Status: AC
  Filled 2014-04-14: qty 30

## 2014-04-14 MED ORDER — FUROSEMIDE 10 MG/ML IJ SOLN
20.0000 mg | Freq: Once | INTRAMUSCULAR | Status: AC
  Administered 2014-04-14: 20 mg via INTRAVENOUS

## 2014-04-14 MED ORDER — ASPIRIN 81 MG PO CHEW: 81.0000 mg | CHEWABLE_TABLET | ORAL | Status: AC

## 2014-04-14 MED ORDER — HEPARIN SODIUM (PORCINE) 1000 UNIT/ML IJ SOLN
INTRAMUSCULAR | Status: AC
  Filled 2014-04-14: qty 1

## 2014-04-14 NOTE — Telephone Encounter (Signed)
Pt aware of Dr.Smith's instructions to come into the office next wk 12/10 for a bmet. Pt verbalized understanding.

## 2014-04-14 NOTE — Interval H&P Note (Signed)
Cath Lab Visit (complete for each Cath Lab visit)  Clinical Evaluation Leading to the Procedure:   ACS: Yes.    Non-ACS:    Anginal Classification: CCS III  Anti-ischemic medical therapy: Maximal Therapy (2 or more classes of medications)  Non-Invasive Test Results: No non-invasive testing performed  Prior CABG: No previous CABG      History and Physical Interval Note:  04/14/2014 7:48 AM  Pamela [name]  has presented today for surgery, with the diagnosis of unstable angina  The various methods of treatment have been discussed with the patient and family. After consideration of risks, benefits and other options for treatment, the patient has consented to  Procedure(s): LEFT HEART CATHETERIZATION WITH CORONARY ANGIOGRAM (N/A) as a surgical intervention .  The patient's history has been reviewed, patient examined, no change in status, stable for surgery.  I have reviewed the patient's chart and labs.  Questions were answered to the patient's satisfaction.     Lesleigh Noe

## 2014-04-14 NOTE — Discharge Instructions (Signed)
Radial Site Care °Refer to this sheet in the next few weeks. These instructions provide you with information on caring for yourself after your procedure. Your caregiver may also give you more specific instructions. Your treatment has been planned according to current medical practices, but problems sometimes occur. Call your caregiver if you have any problems or questions after your procedure. °HOME CARE INSTRUCTIONS °· You may shower the day after the procedure. Remove the bandage (dressing) and gently wash the site with plain soap and water. Gently pat the site dry. °· Do not apply powder or lotion to the site. °· Do not submerge the affected site in water for 3 to 5 days. °· Inspect the site at least twice daily. °· Do not flex or bend the affected arm for 24 hours. °· No lifting over 5 pounds (2.3 kg) for 5 days after your procedure. °· Do not drive home if you are discharged the same day of the procedure. Have someone else drive you. °· You may drive 24 hours after the procedure unless otherwise instructed by your caregiver. °· Do not operate machinery or power tools for 24 hours. °· A responsible adult should be with you for the first 24 hours after you arrive home. °What to expect: °· Any bruising will usually fade within 1 to 2 weeks. °· Blood that collects in the tissue (hematoma) may be painful to the touch. It should usually decrease in size and tenderness within 1 to 2 weeks. °SEEK IMMEDIATE MEDICAL CARE IF: °· You have unusual pain at the radial site. °· You have redness, warmth, swelling, or pain at the radial site. °· You have drainage (other than a small amount of blood on the dressing). °· You have chills. °· You have a fever or persistent symptoms for more than 72 hours. °· You have a fever and your symptoms suddenly get worse. °· Your arm becomes pale, cool, tingly, or numb. °· You have heavy bleeding from the site. Hold pressure on the site. °Document Released: 06/01/2010 Document Revised:  07/22/2011 Document Reviewed: 06/01/2010 °[employer]® Patient Information ©2015 [employer]. This information is not intended to replace advice given to you by your health care provider. Make sure you discuss any questions you have with your health care provider. ° °

## 2014-04-14 NOTE — Progress Notes (Signed)
Dr Katrinka Blazing notified of 550cc UOP after IV Lasix given.  Instructed pt to begin Lasix tomorrow as instructed unless she becomes short of breath tonight.  If that occurs, she may take Lasix as prescribed tonight.

## 2014-04-14 NOTE — H&P (View-Only) (Signed)
Patient ID: Pamela [name], female   DOB: 1951/04/23, 63 y.o.   MRN: [id number]    [address] [place], [place]  [address] Phone: [phone] Fax:  [phone]  Date:  04/11/2014   ID:  Tran, Farish Jun 21, 1950, MRN [id number]  PCP:  No primary care provider on file.   ASSESSMENT:  1. Coronary atherosclerosis with continued left chest and arm pain occurring both with exertion and at rest, despite treatment of severe anemia 2. Anemia with recent transfusion 3. Significant elevation in blood pressure 4. COPD  PLAN:  1. Basic metabolic panel, CBC, as precath data  2. Coronary angiography to define anatomy assuming that hemoglobin is still greater than 90 3. Amlodipine 5 mg per day 4. Diagnostic coronary angiography with possible PCI via the radial approach was described to the patient and husband. The procedure including the risk of stroke, death, myocardial infarction, kidney injury, limb ischemia, among other potential palpitations were discussed in detail except above the patient.     SUBJECTIVE: Pamela [name] is a 63 y.o. female who was recently hospitalized 2 weeks ago with severe anemia and hemoglobin less than 7. She received 2 units of blood. Despite transfusion to hemoglobin greater than 9 she has continued to have vague left chest discomfort with radiation to the left arm. The discomfort has been occurring both at rest and with exertion. Episodes last up to 5-10 minutes. She had no discomfort today while coming into the office. Her medications were adjusted while hospitalized and included discontinuing Ramipril. She was started on isosorbide. No improvement in chest discomfort has occurred.  Wt Readings from Last 3 Encounters:  04/11/14 114 lb (51.71 kg)  03/25/14 118 lb (53.524 kg)  03/25/14 118 lb (53.524 kg)    Past Surgical History  Procedure Laterality Date  . Abdominal hysterectomy    . Elbow surgery Right     tendon surgery  . Tubal  ligation    . Breast surgery      cyst removed  . Cholecystectomy      '74-open  . Appendectomy      '74- open with gallbladder  . Cardiac catheterization      1 coronary stent placed  . Ganglion cyst excision Bilateral 10-29-12    wrist  . Anal fissure repair    . Colonoscopy with propofol N/A 11/17/2012    Procedure: COLONOSCOPY WITH PROPOFOL;  Surgeon: Charolett Bumpers, MD;  Location: [facility];  Service: Endoscopy;  Laterality: N/A;  . Esophagogastroduodenoscopy (egd) with propofol N/A 11/17/2012    Procedure: ESOPHAGOGASTRODUODENOSCOPY (EGD) WITH PROPOFOL;  Surgeon: Charolett Bumpers, MD;  Location: [facility];  Service: Endoscopy;  Laterality: N/A;    Past Medical History  Diagnosis Date  . PONV (postoperative nausea and vomiting)   . Hypertension   . COPD (chronic obstructive pulmonary disease)   . Diabetes mellitus without complication 10-29-12  . Coronary artery disease     Current Outpatient Prescriptions  Medication Sig Dispense Refill  . ALPRAZolam (XANAX) 0.5 MG tablet Take 0.5 mg by mouth 2 (two) times daily.    Marland Kitchen aspirin EC 81 MG tablet Take 81 mg by mouth daily.    Marland Kitchen atorvastatin (LIPITOR) 10 MG tablet Take 10 mg by mouth every morning.    . carvedilol (COREG) 25 MG tablet Take 1 tablet (25 mg total) by mouth 3 (three) times daily. 90 tablet 0  . glyBURIDE micronized (GLYNASE) 6 MG tablet Take 6 mg by  mouth 2 (two) times daily before a meal.    . hydrochlorothiazide (HYDRODIURIL) 25 MG tablet Take 25 mg by mouth every morning.    . iron polysaccharides (NIFEREX) 150 MG capsule Take 1 capsule (150 mg total) by mouth daily. 30 capsule 0  . isosorbide dinitrate (ISORDIL) 20 MG tablet Take 1 tablet (20 mg total) by mouth 3 (three) times daily. 90 tablet 0  . latanoprost (XALATAN) 0.005 % ophthalmic solution Place 1 drop into both eyes at bedtime.    . metFORMIN (GLUCOPHAGE) 500 MG tablet Take 500-1,000 mg by mouth 3 (three) times daily. Takes 2 in the morning 1 at  lunch and 2 at night    . nitroGLYCERIN (NITROSTAT) 0.4 MG SL tablet Place 0.4 mg under the tongue every 5 (five) minutes as needed for chest pain.    . salmeterol (SEREVENT) 50 MCG/DOSE diskus inhaler Inhale 1 puff into the lungs 2 (two) times daily.     No current facility-administered medications for this visit.    Allergies:   No Known Allergies  Social History:  The patient  reports that she quit smoking about 21 years ago. Her smoking use included Cigarettes. She smoked 1.50 packs per day. She does not have any smokeless tobacco history on file. She reports that she does not drink alcohol or use illicit drugs.   ROS:  Please see the history of present illness.   Denies claudication. No episodes of syncope. No melena or other GI complaints. Appetite is poor. Weight loss has been gradual and progressive.   All other systems reviewed and negative.   OBJECTIVE: VS:  BP 188/96 mmHg  Pulse 103  Ht 5' 1.5" (1.562 m)  Wt 114 lb (51.71 kg)  BMI 21.19 kg/m2 Well nourished, well developed, in no acute distress, appears pale and older than stated age  HEENT: normal Neck: JVD flat. Carotid bruit absent  Cardiac:  normal S1, S2; RRR; no murmur Lungs:  clear to auscultation bilaterally, no wheezing, rhonchi or rales Abd: soft, nontender, no hepatomegaly Ext: Edema had 2+ bilateral edema to the midshin region. Pulses 2+ radials 2+ femorals  Skin: warm and dryAn pale  Neuro:  CNs 2-12 intact, no focal abnormalities noted  EKG:  Sinus tachycardia with vertical axis       Signed, Henry W. B. Smith III, MD 04/11/2014 10:21 AM   

## 2014-04-14 NOTE — CV Procedure (Signed)
     Left Heart Catheterization with Coronary Angiography Report  Pamela [name]  63 y.o.  female [birth year]  Procedure Date: 04/14/2014 Referring Physician: Danise Edge, M.D. Primary Cardiologist: Verdis Prime, M.D.  INDICATIONS: Class III-IV angina pectoris  PROCEDURE: 1. Left heart catheterization; 2. Coronary angiography; 3. Left ventriculography  CONSENT:  The risks, benefits, and details of the procedure were explained in detail to the patient. Risks including death, stroke, heart attack, kidney injury, allergy, limb ischemia, bleeding and radiation injury were discussed.  The patient verbalized understanding and wanted to proceed.  Informed written consent was obtained.  PROCEDURE TECHNIQUE:  After Xylocaine anesthesia a 5 French Slender sheath was placed in the right radial artery with an angiocath and the modified Seldinger technique.  Multiple passes were made prior to sheath insertion. We had a moderately long period of intense spasm that led to absence of the arterial pulse. Subcutaneous nitroglycerin was administered and additional sedation given. The pulse eventually returned and we were successful at sheath insertion. Coronary angiography was done using a 5 F JR4 catheter.  Left ventriculography was done using the JR 4 catheter and hand injection.   Images were reviewed and the case was terminated.  Hemostasis with a radial wrist band using 13 cc of air.   CONTRAST:  Total of 105 cc.  COMPLICATIONS:  None   HEMODYNAMICS:  Aortic pressure 162/82 mmHg; LV pressure 162/22 mmHg; LVEDP 28 mmHg  ANGIOGRAPHIC DATA:   The left main coronary artery is widely patent.  The left anterior descending artery is patent. It contains a proximal stent. After the first diagonal and proximal to the stent there is 50% narrowing in the LAD in cranial views with LAO obliquity. The first diagonal covers a moderate distribution. The diagonal is severely diseased in the mid segment after it  bifurcates and contains 85-90% obstruction. Vessel diameter is small.. The LAD wraps around the left ventricular apex.  The left circumflex artery is widely patent. The proximal vessel contains eccentric 30% narrowing. 3 obtuse marginal branches are free of any significant obstruction. The mid circumflex after the origin of the first marginal contains 50% narrowing. The first marginal contains 40-50% proximal narrowing.  The right coronary artery is dominant and contains a concentric 50% narrowing proximally. The distal vessel is large and free of any significant obstruction.Marland Kitchen   LEFT VENTRICULOGRAM:  Left ventricular angiogram was done in the 30 RAO projection and revealed global hypokinesis with an EF of approximately 35-40%. LVEDP was elevated.   IMPRESSIONS:  1. Severe disease in a branch of the first diagonal. The vessel is relatively small in caliber and is best treated with medical therapy. This is the likely source of the patient's angina. 2. Widely patent LAD stent. Noncritical circumflex and right coronary disease as noted above. 3. Global left ventricular systolic dysfunction with elevated end-diastolic pressure consistent with chronic systolic heart failure.   RECOMMENDATION:  Medical management of angina to include up titration of beta blocker and nitrate therapy.  Enhance heart failure therapy.  Consider repeat GI workup to identify the source of anemia (if indicated).

## 2014-04-20 ENCOUNTER — Other Ambulatory Visit: Payer: Self-pay | Admitting: *Deleted

## 2014-04-20 MED ORDER — CARVEDILOL 25 MG PO TABS: 25.0000 mg | ORAL_TABLET | Freq: Three times a day (TID) | ORAL | Status: DC

## 2014-04-20 MED ORDER — FUROSEMIDE 40 MG PO TABS: 40.0000 mg | ORAL_TABLET | Freq: Every day | ORAL | Status: DC

## 2014-04-20 MED ORDER — LISINOPRIL 10 MG PO TABS: 10.0000 mg | ORAL_TABLET | Freq: Every day | ORAL | Status: DC

## 2014-04-20 MED ORDER — ISOSORBIDE DINITRATE 20 MG PO TABS: 20.0000 mg | ORAL_TABLET | Freq: Three times a day (TID) | ORAL | Status: DC

## 2014-04-20 MED ORDER — AMLODIPINE BESYLATE 5 MG PO TABS: 5.0000 mg | ORAL_TABLET | Freq: Every day | ORAL | Status: DC

## 2014-04-21 ENCOUNTER — Encounter (HOSPITAL_COMMUNITY): Payer: Self-pay | Admitting: Interventional Cardiology

## 2014-04-21 ENCOUNTER — Other Ambulatory Visit (INDEPENDENT_AMBULATORY_CARE_PROVIDER_SITE_OTHER): Payer: Medicare Other | Admitting: *Deleted

## 2014-04-21 DIAGNOSIS — I5022 Chronic systolic (congestive) heart failure: Secondary | ICD-10-CM

## 2014-04-21 LAB — BASIC METABOLIC PANEL
BUN: 54 mg/dL — ABNORMAL HIGH (ref 6–23)
CALCIUM: 8.7 mg/dL (ref 8.4–10.5)
CO2: 23 mEq/L (ref 19–32)
CREATININE: 2.1 mg/dL — AB (ref 0.4–1.2)
Chloride: 94 mEq/L — ABNORMAL LOW (ref 96–112)
GFR: 25.47 mL/min — ABNORMAL LOW (ref >60.00)
Glucose, Bld: 187 mg/dL — ABNORMAL HIGH (ref 70–99)
Potassium: 4.6 mEq/L (ref 3.5–5.1)
SODIUM: 130 meq/L — AB (ref 135–145)

## 2014-04-22 ENCOUNTER — Telehealth: Payer: Self-pay

## 2014-04-22 MED ORDER — FUROSEMIDE 40 MG PO TABS: 20.0000 mg | ORAL_TABLET | Freq: Every day | ORAL | Status: DC

## 2014-04-22 NOTE — Telephone Encounter (Signed)
F/U ° ° ° ° ° ° °Pt returning call. °

## 2014-04-22 NOTE — Telephone Encounter (Signed)
Pt aware of lab results and Dr.Smith recommendations. her   kidney function is not as good. It is due to the stronger diuretic. Pt reports that swelling and sob is much better.pt should decrease furosemide to 20 mg per day.pt to call the office if swelling and sob worsen. Pt agreeable and verbalized understanding.

## 2014-04-22 NOTE — Telephone Encounter (Signed)
-----   Message from Lyn Records III, MD sent at 04/21/2014  2:52 PM EST ----- That her know the kidney function is not as good. It is due to the stronger diuretic. Has she can rid of the excess fluid? Is her breathing better? Decrease furosemide to 20 mg per day.

## 2014-04-22 NOTE — Telephone Encounter (Signed)
Follow up ° ° ° ° °Returning a nurses call °

## 2014-04-22 NOTE — Telephone Encounter (Signed)
Called to give pt lab results.lmtcb  

## 2014-04-25 MED ORDER — FUROSEMIDE 20 MG PO TABS: 20.0000 mg | ORAL_TABLET | Freq: Every day | ORAL | Status: DC

## 2014-04-25 NOTE — Addendum Note (Signed)
Addended by: Jarvis Newcomer on: 04/25/2014 07:15 AM   Modules accepted: Orders

## 2014-04-28 ENCOUNTER — Telehealth: Payer: Self-pay | Admitting: *Deleted

## 2014-04-28 NOTE — Telephone Encounter (Signed)
Patient would like to know if she should continue to take the iron that was prescribed for her. Please advise. Thanks, MI

## 2014-04-28 NOTE — Telephone Encounter (Signed)
Pt adv to f/u with pcp Dr.Schooler for iron refills. Adv pt to call their office in the morning. Pt verbalized understanding.

## 2014-05-18 ENCOUNTER — Encounter: Payer: Self-pay | Admitting: Interventional Cardiology

## 2014-05-27 ENCOUNTER — Other Ambulatory Visit (HOSPITAL_COMMUNITY): Payer: Self-pay | Admitting: *Deleted

## 2014-05-30 ENCOUNTER — Encounter (HOSPITAL_COMMUNITY): Payer: Self-pay

## 2014-05-30 ENCOUNTER — Encounter (HOSPITAL_COMMUNITY)
Admission: RE | Admit: 2014-05-30 | Discharge: 2014-05-30 | Disposition: A | Discharge status: Home / Self Care | Payer: Medicare Other | Source: Ambulatory Visit | Attending: Nephrology | Admitting: Nephrology

## 2014-05-30 DIAGNOSIS — N183 Chronic kidney disease, stage 3 (moderate): Secondary | ICD-10-CM | POA: Insufficient documentation

## 2014-05-30 DIAGNOSIS — Z5181 Encounter for therapeutic drug level monitoring: Secondary | ICD-10-CM | POA: Insufficient documentation

## 2014-05-30 DIAGNOSIS — D631 Anemia in chronic kidney disease: Secondary | ICD-10-CM | POA: Insufficient documentation

## 2014-05-30 LAB — POCT HEMOGLOBIN-HEMACUE: HEMOGLOBIN: 9.9 g/dL — AB (ref 12.0–15.0)

## 2014-05-30 MED ORDER — SODIUM CHLORIDE 0.9 % IV SOLN
1020.0000 mg | Freq: Once | INTRAVENOUS | Status: AC
  Administered 2014-05-30: 1020 mg via INTRAVENOUS
  Filled 2014-05-30: qty 34

## 2014-05-30 MED ORDER — EPOETIN ALFA 20000 UNIT/ML IJ SOLN
INTRAMUSCULAR | Status: AC
  Administered 2014-05-30: 20000 [IU] via SUBCUTANEOUS
  Filled 2014-05-30: qty 1

## 2014-05-30 MED ORDER — EPOETIN ALFA 20000 UNIT/ML IJ SOLN
20000.0000 [IU] | INTRAMUSCULAR | Status: DC
  Administered 2014-05-30: 20000 [IU] via SUBCUTANEOUS

## 2014-05-30 NOTE — Discharge Instructions (Signed)
Epoetin Alfa injection °What is this medicine? °EPOETIN ALFA (e POE e tin AL fa) helps your body make more red blood cells. This medicine is used to treat anemia caused by chronic kidney failure, cancer chemotherapy, or HIV-therapy. It may also be used before surgery if you have anemia. °This medicine may be used for other purposes; ask your health care provider or pharmacist if you have questions. °COMMON BRAND NAME(S): Epogen, Procrit °What should I tell my health care provider before I take this medicine? °They need to know if you have any of these conditions: °-blood clotting disorders °-cancer patient not on chemotherapy °-cystic fibrosis °-heart disease, such as angina or heart failure °-hemoglobin level of 12 g/dL or greater °-high blood pressure °-low levels of folate, iron, or vitamin B12 °-seizures °-an unusual or allergic reaction to erythropoietin, albumin, benzyl alcohol, hamster proteins, other medicines, foods, dyes, or preservatives °-pregnant or trying to get pregnant °-breast-feeding °How should I use this medicine? °This medicine is for injection into a vein or under the skin. It is usually given by a health care professional in a hospital or clinic setting. °If you get this medicine at home, you will be taught how to prepare and give this medicine. Use exactly as directed. Take your medicine at regular intervals. Do not take your medicine more often than directed. °It is important that you put your used needles and syringes in a special sharps container. Do not put them in a trash can. If you do not have a sharps container, call your pharmacist or healthcare provider to get one. °Talk to your pediatrician regarding the use of this medicine in children. While this drug may be prescribed for selected conditions, precautions do apply. °Overdosage: If you think you have taken too much of this medicine contact a poison control center or emergency room at once. °NOTE: This medicine is only for you. Do  not share this medicine with others. °What if I miss a dose? °If you miss a dose, take it as soon as you can. If it is almost time for your next dose, take only that dose. Do not take double or extra doses. °What may interact with this medicine? °Do not take this medicine with any of the following medications: °-darbepoetin alfa °This list may not describe all possible interactions. Give your health care provider a list of all the medicines, herbs, non-prescription drugs, or dietary supplements you use. Also tell them if you smoke, drink alcohol, or use illegal drugs. Some items may interact with your medicine. °What should I watch for while using this medicine? °Visit your prescriber or health care professional for regular checks on your progress and for the needed blood tests and blood pressure measurements. It is especially important for the doctor to make sure your hemoglobin level is in the desired range, to limit the risk of potential side effects and to give you the best benefit. Keep all appointments for any recommended tests. Check your blood pressure as directed. Ask your doctor what your blood pressure should be and when you should contact him or her. °As your body makes more red blood cells, you may need to take iron, folic acid, or vitamin B supplements. Ask your doctor or health care provider which products are right for you. If you have kidney disease continue dietary restrictions, even though this medication can make you feel better. Talk with your doctor or health care professional about the foods you eat and the vitamins that you take. °What   side effects may I notice from receiving this medicine? °Side effects that you should report to your doctor or health care professional as soon as possible: °-allergic reactions like skin rash, itching or hives, swelling of the face, lips, or tongue °-breathing problems °-changes in vision °-chest pain °-confusion, trouble speaking or understanding °-feeling  faint or lightheaded, falls °-high blood pressure °-muscle aches or pains °-pain, swelling, warmth in the leg °-rapid weight gain °-severe headaches °-sudden numbness or weakness of the face, arm or leg °-trouble walking, dizziness, loss of balance or coordination °-seizures (convulsions) °-swelling of the ankles, feet, hands °-unusually weak or tired °Side effects that usually do not require medical attention (report to your doctor or health care professional if they continue or are bothersome): °-diarrhea °-fever, chills (flu-like symptoms) °-headaches °-nausea, vomiting °-redness, stinging, or swelling at site where injected °This list may not describe all possible side effects. Call your doctor for medical advice about side effects. You may report side effects to FDA at 1-800-FDA-1088. °Where should I keep my medicine? °Keep out of the reach of children. °Store in a refrigerator between 2 and 8 degrees C (36 and 46 degrees F). Do not freeze or shake. Throw away any unused portion if using a single-dose vial. Multi-dose vials can be kept in the refrigerator for up to 21 days after the initial dose. Throw away unused medicine. °NOTE: This sheet is a summary. It may not cover all possible information. If you have questions about this medicine, talk to your doctor, pharmacist, or health care provider. °© 2015, [employer]. (2008-04-12 10:25:44) °Ferumoxytol injection °What is this medicine? °FERUMOXYTOL is an iron complex. Iron is used to make healthy red blood cells, which carry oxygen and nutrients throughout the body. This medicine is used to treat iron deficiency anemia in people with chronic kidney disease. °This medicine may be used for other purposes; ask your health care provider or pharmacist if you have questions. °COMMON BRAND NAME(S): Feraheme °What should I tell my health care provider before I take this medicine? °They need to know if you have any of these conditions: °-anemia not caused by  low iron levels °-high levels of iron in the blood °-magnetic resonance imaging (MRI) test scheduled °-an unusual or allergic reaction to iron, other medicines, foods, dyes, or preservatives °-pregnant or trying to get pregnant °-breast-feeding °How should I use this medicine? °This medicine is for injection into a vein. It is given by a health care professional in a hospital or clinic setting. °Talk to your pediatrician regarding the use of this medicine in children. Special care may be needed. °Overdosage: If you think you've taken too much of this medicine contact a poison control center or emergency room at once. °Overdosage: If you think you have taken too much of this medicine contact a poison control center or emergency room at once. °NOTE: This medicine is only for you. Do not share this medicine with others. °What if I miss a dose? °It is important not to miss your dose. Call your doctor or health care professional if you are unable to keep an appointment. °What may interact with this medicine? °This medicine may interact with the following medications: °-other iron products °This list may not describe all possible interactions. Give your health care provider a list of all the medicines, herbs, non-prescription drugs, or dietary supplements you use. Also tell them if you smoke, drink alcohol, or use illegal drugs. Some items may interact with your medicine. °What should I   watch for while using this medicine? °Visit your doctor or healthcare professional regularly. Tell your doctor or healthcare professional if your symptoms do not start to get better or if they get worse. You may need blood work done while you are taking this medicine. °You may need to follow a special diet. Talk to your doctor. Foods that contain iron include: whole grains/cereals, dried fruits, beans, or peas, leafy green vegetables, and organ meats (liver, kidney). °What side effects may I notice from receiving this medicine? °Side  effects that you should report to your doctor or health care professional as soon as possible: °-allergic reactions like skin rash, itching or hives, swelling of the face, lips, or tongue °-breathing problems °-changes in blood pressure °-feeling faint or lightheaded, falls °-fever or chills °-flushing, sweating, or hot feelings °-swelling of the ankles or feet °Side effects that usually do not require medical attention (Report these to your doctor or health care professional if they continue or are bothersome.): °-diarrhea °-headache °-nausea, vomiting °-stomach pain °This list may not describe all possible side effects. Call your doctor for medical advice about side effects. You may report side effects to FDA at 1-800-FDA-1088. °Where should I keep my medicine? °This drug is given in a hospital or clinic and will not be stored at home. °NOTE: This sheet is a summary. It may not cover all possible information. If you have questions about this medicine, talk to your doctor, pharmacist, or health care provider. °© 2015, [employer]. (2011-12-13 15:23:36) ° ° °

## 2014-06-01 ENCOUNTER — Other Ambulatory Visit: Payer: Self-pay

## 2014-06-01 DIAGNOSIS — Z1231 Encounter for screening mammogram for malignant neoplasm of breast: Secondary | ICD-10-CM

## 2014-06-13 ENCOUNTER — Encounter (HOSPITAL_COMMUNITY)
Admission: RE | Admit: 2014-06-13 | Discharge: 2014-06-13 | Disposition: A | Discharge status: Home / Self Care | Payer: Medicare Other | Source: Ambulatory Visit | Attending: Nephrology | Admitting: Nephrology

## 2014-06-13 DIAGNOSIS — Z5181 Encounter for therapeutic drug level monitoring: Secondary | ICD-10-CM | POA: Insufficient documentation

## 2014-06-13 DIAGNOSIS — D631 Anemia in chronic kidney disease: Secondary | ICD-10-CM | POA: Insufficient documentation

## 2014-06-13 DIAGNOSIS — N183 Chronic kidney disease, stage 3 (moderate): Secondary | ICD-10-CM | POA: Insufficient documentation

## 2014-06-13 LAB — RENAL FUNCTION PANEL
Albumin: 4.3 g/dL (ref 3.5–5.2)
Anion gap: 17 — ABNORMAL HIGH (ref 5–15)
BUN: 75 mg/dL — AB (ref 6–23)
CALCIUM: 9.3 mg/dL (ref 8.4–10.5)
CO2: 22 mmol/L (ref 19–32)
Chloride: 96 mmol/L (ref 96–112)
Creatinine, Ser: 2.23 mg/dL — ABNORMAL HIGH (ref 0.50–1.10)
GFR, EST AFRICAN AMERICAN: 26 mL/min — AB (ref >90)
GFR, EST NON AFRICAN AMERICAN: 22 mL/min — AB (ref >90)
GLUCOSE: 241 mg/dL — AB (ref 70–99)
Phosphorus: 4.8 mg/dL — ABNORMAL HIGH (ref 2.3–4.6)
Potassium: 4.4 mmol/L (ref 3.5–5.1)
SODIUM: 135 mmol/L (ref 135–145)

## 2014-06-13 LAB — POCT HEMOGLOBIN-HEMACUE
HEMOGLOBIN: 12.2 g/dL (ref 12.0–15.0)
Hemoglobin: 12.6 g/dL (ref 12.0–15.0)

## 2014-06-13 MED ORDER — EPOETIN ALFA 20000 UNIT/ML IJ SOLN: 20000.0000 [IU] | INTRAMUSCULAR | Status: DC

## 2014-06-14 LAB — PTH, INTACT AND CALCIUM
Calcium, Total (PTH): 9.5 mg/dL (ref 8.7–10.3)
PTH: 60 pg/mL (ref 15–65)

## 2014-06-27 ENCOUNTER — Encounter (HOSPITAL_COMMUNITY): Payer: 59

## 2014-07-04 ENCOUNTER — Ambulatory Visit
Admission: RE | Admit: 2014-07-04 | Discharge: 2014-07-04 | Disposition: A | Discharge status: Home / Self Care | Payer: Medicare Other | Source: Ambulatory Visit

## 2014-07-04 DIAGNOSIS — Z1231 Encounter for screening mammogram for malignant neoplasm of breast: Secondary | ICD-10-CM

## 2014-07-12 ENCOUNTER — Encounter (HOSPITAL_COMMUNITY)
Admission: RE | Admit: 2014-07-12 | Discharge: 2014-07-12 | Disposition: A | Discharge status: Home / Self Care | Payer: Medicare Other | Source: Ambulatory Visit | Attending: Nephrology | Admitting: Nephrology

## 2014-07-12 DIAGNOSIS — N183 Chronic kidney disease, stage 3 (moderate): Secondary | ICD-10-CM | POA: Insufficient documentation

## 2014-07-12 DIAGNOSIS — Z5181 Encounter for therapeutic drug level monitoring: Secondary | ICD-10-CM | POA: Diagnosis not present

## 2014-07-12 DIAGNOSIS — D631 Anemia in chronic kidney disease: Secondary | ICD-10-CM | POA: Diagnosis not present

## 2014-07-12 LAB — RENAL FUNCTION PANEL
ALBUMIN: 4 g/dL (ref 3.5–5.2)
ANION GAP: 15 (ref 5–15)
BUN: 54 mg/dL — AB (ref 6–23)
CHLORIDE: 94 mmol/L — AB (ref 96–112)
CO2: 26 mmol/L (ref 19–32)
Calcium: 8.8 mg/dL (ref 8.4–10.5)
Creatinine, Ser: 2.19 mg/dL — ABNORMAL HIGH (ref 0.50–1.10)
GFR calc Af Amer: 26 mL/min — ABNORMAL LOW (ref >90)
GFR calc non Af Amer: 23 mL/min — ABNORMAL LOW (ref >90)
GLUCOSE: 210 mg/dL — AB (ref 70–99)
POTASSIUM: 4.2 mmol/L (ref 3.5–5.1)
Phosphorus: 4.5 mg/dL (ref 2.3–4.6)
SODIUM: 135 mmol/L (ref 135–145)

## 2014-07-12 LAB — IRON AND TIBC
Iron: 95 ug/dL (ref 42–145)
Saturation Ratios: 31 % (ref 20–55)
TIBC: 302 ug/dL (ref 250–470)
UIBC: 207 ug/dL (ref 125–400)

## 2014-07-12 LAB — FERRITIN: Ferritin: 246 ng/mL (ref 10–291)

## 2014-07-12 LAB — POCT HEMOGLOBIN-HEMACUE: HEMOGLOBIN: 11.5 g/dL — AB (ref 12.0–15.0)

## 2014-07-12 MED ORDER — EPOETIN ALFA 20000 UNIT/ML IJ SOLN
20000.0000 [IU] | INTRAMUSCULAR | Status: DC
Start: 2014-07-12 — End: 2014-07-13
  Administered 2014-07-12: 20000 [IU] via SUBCUTANEOUS

## 2014-07-12 MED ORDER — EPOETIN ALFA 20000 UNIT/ML IJ SOLN
INTRAMUSCULAR | Status: AC
  Administered 2014-07-12: 20000 [IU] via SUBCUTANEOUS
  Filled 2014-07-12: qty 1

## 2014-07-13 LAB — PTH, INTACT AND CALCIUM
CALCIUM TOTAL (PTH): 8.9 mg/dL (ref 8.7–10.3)
PTH: 97 pg/mL — ABNORMAL HIGH (ref 15–65)

## 2014-07-18 ENCOUNTER — Other Ambulatory Visit: Payer: Self-pay | Admitting: Interventional Cardiology

## 2014-07-27 ENCOUNTER — Encounter (HOSPITAL_COMMUNITY): Payer: Medicare Other

## 2014-07-29 ENCOUNTER — Other Ambulatory Visit: Payer: Self-pay | Admitting: Interventional Cardiology

## 2014-07-29 ENCOUNTER — Telehealth: Payer: Self-pay | Admitting: Interventional Cardiology

## 2014-07-29 MED ORDER — ISOSORBIDE DINITRATE 20 MG PO TABS: 20.0000 mg | ORAL_TABLET | Freq: Three times a day (TID) | ORAL | Status: DC

## 2014-07-29 MED ORDER — AMLODIPINE BESYLATE 5 MG PO TABS: 5.0000 mg | ORAL_TABLET | Freq: Every day | ORAL | Status: DC

## 2014-07-29 NOTE — Telephone Encounter (Signed)
New Message     Patient is calling in regards to her meds. She [facility] into [facility] and she was not sent enough pills. She is concerned about this because she is suppose to get 3 months supply but didn't. Please give patient a call back.

## 2014-07-29 NOTE — Telephone Encounter (Signed)
Called patient back. According to medication refill instructions, patient needed a follow-up visit for further medication refills. Patient explained that when she gets three months supply that she only pays for two months and gets the third month free. With the last order patient was only give 30 day supply. Patient has in the mean time made an appointment with Dr. Katrinka Blazing for 10/24/2014. Reorder medications at 3 month supply to cover patient until her next appointment. Patient verbalized understanding.

## 2014-08-12 ENCOUNTER — Other Ambulatory Visit (HOSPITAL_COMMUNITY): Payer: Self-pay | Admitting: *Deleted

## 2014-08-15 ENCOUNTER — Encounter (HOSPITAL_COMMUNITY)
Admission: RE | Admit: 2014-08-15 | Discharge: 2014-08-15 | Disposition: A | Discharge status: Home / Self Care | Payer: Medicare Other | Source: Ambulatory Visit | Attending: Nephrology | Admitting: Nephrology

## 2014-08-15 DIAGNOSIS — Z79899 Other long term (current) drug therapy: Secondary | ICD-10-CM | POA: Insufficient documentation

## 2014-08-15 DIAGNOSIS — Z5181 Encounter for therapeutic drug level monitoring: Secondary | ICD-10-CM | POA: Insufficient documentation

## 2014-08-15 DIAGNOSIS — D631 Anemia in chronic kidney disease: Secondary | ICD-10-CM | POA: Insufficient documentation

## 2014-08-15 DIAGNOSIS — N183 Chronic kidney disease, stage 3 (moderate): Secondary | ICD-10-CM | POA: Insufficient documentation

## 2014-08-15 LAB — RENAL FUNCTION PANEL
ANION GAP: 16 — AB (ref 5–15)
Albumin: 4 g/dL (ref 3.5–5.2)
BUN: 71 mg/dL — AB (ref 6–23)
CO2: 25 mmol/L (ref 19–32)
Calcium: 9.2 mg/dL (ref 8.4–10.5)
Chloride: 94 mmol/L — ABNORMAL LOW (ref 96–112)
Creatinine, Ser: 2.46 mg/dL — ABNORMAL HIGH (ref 0.50–1.10)
GFR calc Af Amer: 23 mL/min — ABNORMAL LOW (ref >90)
GFR calc non Af Amer: 20 mL/min — ABNORMAL LOW (ref >90)
GLUCOSE: 191 mg/dL — AB (ref 70–99)
PHOSPHORUS: 4.7 mg/dL — AB (ref 2.3–4.6)
POTASSIUM: 4.3 mmol/L (ref 3.5–5.1)
Sodium: 135 mmol/L (ref 135–145)

## 2014-08-15 LAB — IRON AND TIBC
Iron: 81 ug/dL (ref 42–145)
SATURATION RATIOS: 26 % (ref 20–55)
TIBC: 310 ug/dL (ref 250–470)
UIBC: 229 ug/dL (ref 125–400)

## 2014-08-15 LAB — POCT HEMOGLOBIN-HEMACUE: Hemoglobin: 11.4 g/dL — ABNORMAL LOW (ref 12.0–15.0)

## 2014-08-15 LAB — FERRITIN: Ferritin: 159 ng/mL (ref 10–291)

## 2014-08-15 MED ORDER — EPOETIN ALFA 20000 UNIT/ML IJ SOLN
20000.0000 [IU] | INTRAMUSCULAR | Status: DC
  Administered 2014-08-15: 20000 [IU] via SUBCUTANEOUS

## 2014-08-15 MED ORDER — EPOETIN ALFA 20000 UNIT/ML IJ SOLN
INTRAMUSCULAR | Status: AC
  Filled 2014-08-15: qty 1

## 2014-08-16 LAB — PTH, INTACT AND CALCIUM
CALCIUM TOTAL (PTH): 9.3 mg/dL (ref 8.7–10.3)
PTH: 89 pg/mL — AB (ref 15–65)

## 2014-08-18 ENCOUNTER — Telehealth: Payer: Self-pay | Admitting: Interventional Cardiology

## 2014-08-18 ENCOUNTER — Other Ambulatory Visit: Payer: Self-pay | Admitting: Interventional Cardiology

## 2014-08-18 NOTE — Telephone Encounter (Signed)
Approved      Disp Refills Start End    amLODipine (NORVASC) 5 MG tablet 90 tablet 0 07/29/2014     Sig - Route:  Take 1 tablet (5 mg total) by mouth daily. - Oral    Class:  Normal    Authorizing Provider:  Lyn Records, MD    Ordering User:  Laurice Record, RN

## 2014-08-18 NOTE — Telephone Encounter (Signed)
lmtcb 4/7 @12 :48 pm

## 2014-08-18 NOTE — Telephone Encounter (Signed)
New Message  Pt c/o medication issue: 1. Name of Medication: amLODipine (NORVASC) 5 MG tablet 2. How are you currently taking this medication (dosage and times per day)? 5mg  3. Are you having a reaction (difficulty breathing--STAT)?   4. What is your medication issue?  Dr Katrinka Blazing called it in for 30 days. They go [facility] plus they do the 90 day supply. Pt  Husband states that Dr. Lacy Duverney her Kidney specialist, Recommends that she should up the medication dosage from 5 mg to 7 1/2mg  per day. Please call back to discuss and call it in to [facility] on the 90 day program.

## 2014-09-12 ENCOUNTER — Encounter (HOSPITAL_COMMUNITY)
Admission: RE | Admit: 2014-09-12 | Discharge: 2014-09-12 | Disposition: A | Discharge status: Home / Self Care | Payer: Medicare Other | Source: Ambulatory Visit | Attending: Nephrology | Admitting: Nephrology

## 2014-09-12 DIAGNOSIS — Z5181 Encounter for therapeutic drug level monitoring: Secondary | ICD-10-CM | POA: Insufficient documentation

## 2014-09-12 DIAGNOSIS — N183 Chronic kidney disease, stage 3 (moderate): Secondary | ICD-10-CM | POA: Insufficient documentation

## 2014-09-12 DIAGNOSIS — D631 Anemia in chronic kidney disease: Secondary | ICD-10-CM | POA: Insufficient documentation

## 2014-09-12 LAB — RENAL FUNCTION PANEL
ANION GAP: 16 — AB (ref 5–15)
Albumin: 4 g/dL (ref 3.5–5.0)
BUN: 86 mg/dL — ABNORMAL HIGH (ref 6–20)
CALCIUM: 9.1 mg/dL (ref 8.9–10.3)
CHLORIDE: 96 mmol/L — AB (ref 101–111)
CO2: 21 mmol/L — ABNORMAL LOW (ref 22–32)
Creatinine, Ser: 2.71 mg/dL — ABNORMAL HIGH (ref 0.44–1.00)
GFR calc non Af Amer: 17 mL/min — ABNORMAL LOW (ref >60)
GFR, EST AFRICAN AMERICAN: 20 mL/min — AB (ref >60)
Glucose, Bld: 164 mg/dL — ABNORMAL HIGH (ref 70–99)
PHOSPHORUS: 4.6 mg/dL (ref 2.5–4.6)
Potassium: 4.5 mmol/L (ref 3.5–5.1)
SODIUM: 133 mmol/L — AB (ref 135–145)

## 2014-09-12 LAB — FERRITIN: FERRITIN: 78 ng/mL (ref 11–307)

## 2014-09-12 LAB — POCT HEMOGLOBIN-HEMACUE: Hemoglobin: 10.5 g/dL — ABNORMAL LOW (ref 12.0–15.0)

## 2014-09-12 LAB — IRON AND TIBC
Iron: 77 ug/dL (ref 28–170)
Saturation Ratios: 22 % (ref 10.4–31.8)
TIBC: 353 ug/dL (ref 250–450)
UIBC: 276 ug/dL

## 2014-09-12 MED ORDER — EPOETIN ALFA 20000 UNIT/ML IJ SOLN
20000.0000 [IU] | INTRAMUSCULAR | Status: DC
  Administered 2014-09-12: 20000 [IU] via SUBCUTANEOUS

## 2014-09-12 MED ORDER — EPOETIN ALFA 20000 UNIT/ML IJ SOLN
INTRAMUSCULAR | Status: AC
  Filled 2014-09-12: qty 1

## 2014-09-13 LAB — PTH, INTACT AND CALCIUM
Calcium, Total (PTH): 9.2 mg/dL (ref 8.7–10.3)
PTH: 104 pg/mL — ABNORMAL HIGH (ref 15–65)

## 2014-10-07 ENCOUNTER — Other Ambulatory Visit (HOSPITAL_COMMUNITY): Payer: Self-pay | Admitting: *Deleted

## 2014-10-11 ENCOUNTER — Encounter (HOSPITAL_COMMUNITY)
Admission: RE | Admit: 2014-10-11 | Discharge: 2014-10-11 | Disposition: A | Discharge status: Home / Self Care | Payer: Medicare Other | Source: Ambulatory Visit | Attending: Nephrology | Admitting: Nephrology

## 2014-10-11 DIAGNOSIS — D631 Anemia in chronic kidney disease: Secondary | ICD-10-CM | POA: Diagnosis not present

## 2014-10-11 DIAGNOSIS — N183 Chronic kidney disease, stage 3 (moderate): Secondary | ICD-10-CM | POA: Insufficient documentation

## 2014-10-11 DIAGNOSIS — Z5181 Encounter for therapeutic drug level monitoring: Secondary | ICD-10-CM | POA: Diagnosis not present

## 2014-10-11 DIAGNOSIS — Z79899 Other long term (current) drug therapy: Secondary | ICD-10-CM | POA: Insufficient documentation

## 2014-10-11 LAB — RENAL FUNCTION PANEL
Albumin: 4.3 g/dL (ref 3.5–5.0)
Anion gap: 20 — ABNORMAL HIGH (ref 5–15)
BUN: 75 mg/dL — ABNORMAL HIGH (ref 6–20)
CO2: 21 mmol/L — AB (ref 22–32)
CREATININE: 2.89 mg/dL — AB (ref 0.44–1.00)
Calcium: 8.9 mg/dL (ref 8.9–10.3)
Chloride: 95 mmol/L — ABNORMAL LOW (ref 101–111)
GFR calc non Af Amer: 16 mL/min — ABNORMAL LOW (ref >60)
GFR, EST AFRICAN AMERICAN: 19 mL/min — AB (ref >60)
Glucose, Bld: 159 mg/dL — ABNORMAL HIGH (ref 65–99)
Phosphorus: 5.2 mg/dL — ABNORMAL HIGH (ref 2.5–4.6)
Potassium: 4.2 mmol/L (ref 3.5–5.1)
Sodium: 136 mmol/L (ref 135–145)

## 2014-10-11 LAB — IRON AND TIBC
IRON: 61 ug/dL (ref 28–170)
Saturation Ratios: 17 % (ref 10.4–31.8)
TIBC: 367 ug/dL (ref 250–450)
UIBC: 306 ug/dL

## 2014-10-11 LAB — FERRITIN: Ferritin: 50 ng/mL (ref 11–307)

## 2014-10-11 LAB — POCT HEMOGLOBIN-HEMACUE: Hemoglobin: 10.9 g/dL — ABNORMAL LOW (ref 12.0–15.0)

## 2014-10-11 MED ORDER — EPOETIN ALFA 20000 UNIT/ML IJ SOLN
INTRAMUSCULAR | Status: AC
  Administered 2014-10-11: 20000 [IU] via SUBCUTANEOUS
  Filled 2014-10-11: qty 1

## 2014-10-11 MED ORDER — EPOETIN ALFA 10000 UNIT/ML IJ SOLN
INTRAMUSCULAR | Status: AC
  Filled 2014-10-11: qty 1

## 2014-10-11 MED ORDER — EPOETIN ALFA 20000 UNIT/ML IJ SOLN: 20000.0000 [IU] | INTRAMUSCULAR | Status: DC

## 2014-10-11 MED ORDER — SODIUM CHLORIDE 0.9 % IV SOLN
510.0000 mg | INTRAVENOUS | Status: DC
  Administered 2014-10-11: 510 mg via INTRAVENOUS
  Filled 2014-10-11: qty 17

## 2014-10-11 MED ORDER — EPOETIN ALFA 20000 UNIT/ML IJ SOLN
INTRAMUSCULAR | Status: AC
  Filled 2014-10-11: qty 1

## 2014-10-12 LAB — PTH, INTACT AND CALCIUM
Calcium, Total (PTH): 9.2 mg/dL (ref 8.7–10.3)
PTH: 100 pg/mL — AB (ref 15–65)

## 2014-10-19 ENCOUNTER — Other Ambulatory Visit: Payer: Self-pay | Admitting: Nephrology

## 2014-10-19 ENCOUNTER — Other Ambulatory Visit (HOSPITAL_COMMUNITY): Payer: Self-pay | Admitting: *Deleted

## 2014-10-19 ENCOUNTER — Encounter (HOSPITAL_COMMUNITY): Payer: Medicare Other

## 2014-10-19 DIAGNOSIS — N183 Chronic kidney disease, stage 3 unspecified: Secondary | ICD-10-CM

## 2014-10-20 ENCOUNTER — Encounter: Payer: Self-pay | Admitting: *Deleted

## 2014-10-20 ENCOUNTER — Encounter (HOSPITAL_COMMUNITY)
Admission: RE | Admit: 2014-10-20 | Discharge: 2014-10-20 | Disposition: A | Discharge status: Home / Self Care | Payer: Medicare Other | Source: Ambulatory Visit | Attending: Nephrology | Admitting: Nephrology

## 2014-10-20 DIAGNOSIS — D509 Iron deficiency anemia, unspecified: Secondary | ICD-10-CM | POA: Insufficient documentation

## 2014-10-20 MED ORDER — SODIUM CHLORIDE 0.9 % IV SOLN
510.0000 mg | INTRAVENOUS | Status: AC
  Administered 2014-10-20: 510 mg via INTRAVENOUS
  Filled 2014-10-20: qty 17

## 2014-10-21 ENCOUNTER — Ambulatory Visit
Admission: RE | Admit: 2014-10-21 | Discharge: 2014-10-21 | Disposition: A | Discharge status: Home / Self Care | Payer: Medicare Other | Source: Ambulatory Visit | Attending: Nephrology | Admitting: Nephrology

## 2014-10-21 DIAGNOSIS — N183 Chronic kidney disease, stage 3 unspecified: Secondary | ICD-10-CM

## 2014-10-24 ENCOUNTER — Encounter: Payer: Self-pay | Admitting: Interventional Cardiology

## 2014-10-24 ENCOUNTER — Ambulatory Visit (INDEPENDENT_AMBULATORY_CARE_PROVIDER_SITE_OTHER): Payer: Medicare Other | Admitting: Interventional Cardiology

## 2014-10-24 VITALS — BP 138/54 | HR 99 | Ht 61.0 in | Wt 114.6 lb

## 2014-10-24 DIAGNOSIS — I10 Essential (primary) hypertension: Secondary | ICD-10-CM

## 2014-10-24 DIAGNOSIS — E785 Hyperlipidemia, unspecified: Secondary | ICD-10-CM | POA: Diagnosis not present

## 2014-10-24 DIAGNOSIS — I25110 Atherosclerotic heart disease of native coronary artery with unstable angina pectoris: Secondary | ICD-10-CM | POA: Diagnosis not present

## 2014-10-24 DIAGNOSIS — J449 Chronic obstructive pulmonary disease, unspecified: Secondary | ICD-10-CM

## 2014-10-24 DIAGNOSIS — N184 Chronic kidney disease, stage 4 (severe): Secondary | ICD-10-CM

## 2014-10-24 NOTE — Patient Instructions (Signed)
Medication Instructions:  Your physician recommends that you continue on your current medications as directed. Please refer to the Current Medication list given to you today.  Labwork: NONE  Testing/Procedures: NONE  Follow-Up: Your physician wants you to follow-up in: 9-12 months with Dr. Katrinka Blazing. You will receive a reminder letter in the mail two months in advance. If you don't receive a letter, please call our office to schedule the follow-up appointment.   Any Other Special Instructions Will Be Listed Below (If Applicable).  Low-Sodium Eating Plan Sodium raises blood pressure and causes water to be held in the body. Getting less sodium from food will help lower your blood pressure, reduce any swelling, and protect your heart, liver, and kidneys. We get sodium by adding salt (sodium chloride) to food. Most of our sodium comes from canned, boxed, and frozen foods. Restaurant foods, fast foods, and pizza are also very high in sodium. Even if you take medicine to lower your blood pressure or to reduce fluid in your body, getting less sodium from your food is important. WHAT IS MY PLAN? Most people should limit their sodium intake to 2,300 mg a day. Your health care provider recommends that you limit your sodium intake to __________ a day.  WHAT DO I NEED TO KNOW ABOUT THIS EATING PLAN? For the low-sodium eating plan, you will follow these general guidelines:  Choose foods with a % Daily Value for sodium of less than 5% (as listed on the food label).   Use salt-free seasonings or herbs instead of table salt or sea salt.   Check with your health care provider or pharmacist before using salt substitutes.   Eat fresh foods.  Eat more vegetables and fruits.  Limit canned vegetables. If you do use them, rinse them well to decrease the sodium.   Limit cheese to 1 oz (28 g) per day.   Eat lower-sodium products, often labeled as "lower sodium" or "no salt added."  Avoid foods that  contain monosodium glutamate (MSG). MSG is sometimes added to [place] food and some canned foods.  Check food labels (Nutrition Facts labels) on foods to learn how much sodium is in one serving.  Eat more home-cooked food and less restaurant, buffet, and fast food.  When eating at a restaurant, ask that your food be prepared with less salt or none, if possible.  HOW DO I READ FOOD LABELS FOR SODIUM INFORMATION? The Nutrition Facts label lists the amount of sodium in one serving of the food. If you eat more than one serving, you must multiply the listed amount of sodium by the number of servings. Food labels may also identify foods as:  Sodium free--Less than 5 mg in a serving.  Very low sodium--35 mg or less in a serving.  Low sodium--140 mg or less in a serving.  Light in sodium--50% less sodium in a serving. For example, if a food that usually has 300 mg of sodium is changed to become light in sodium, it will have 150 mg of sodium.  Reduced sodium--25% less sodium in a serving. For example, if a food that usually has 400 mg of sodium is changed to reduced sodium, it will have 300 mg of sodium. WHAT FOODS CAN I EAT? Grains Low-sodium cereals, including oats, puffed wheat and rice, and shredded wheat cereals. Low-sodium crackers. Unsalted rice and pasta. Lower-sodium bread.  Vegetables Frozen or fresh vegetables. Low-sodium or reduced-sodium canned vegetables. Low-sodium or reduced-sodium tomato sauce and paste. Low-sodium or reduced-sodium tomato and  vegetable juices.  Fruits Fresh, frozen, and canned fruit. Fruit juice.  Meat and Other Protein Products Low-sodium canned tuna and salmon. Fresh or frozen meat, poultry, seafood, and fish. Lamb. Unsalted nuts. Dried beans, peas, and lentils without added salt. Unsalted canned beans. Homemade soups without salt. Eggs.  Dairy Milk. Soy milk. Ricotta cheese. Low-sodium or reduced-sodium cheeses. Yogurt.  Condiments Fresh and  dried herbs and spices. Salt-free seasonings. Onion and garlic powders. Low-sodium varieties of mustard and ketchup. Lemon juice.  Fats and Oils Reduced-sodium salad dressings. Unsalted butter.  Other Unsalted popcorn and pretzels.  The items listed above may not be a complete list of recommended foods or beverages. Contact your dietitian for more options. WHAT FOODS ARE NOT RECOMMENDED? Grains Instant hot cereals. Bread stuffing, pancake, and biscuit mixes. Croutons. Seasoned rice or pasta mixes. Noodle soup cups. Boxed or frozen macaroni and cheese. Self-rising flour. Regular salted crackers. Vegetables Regular canned vegetables. Regular canned tomato sauce and paste. Regular tomato and vegetable juices. Frozen vegetables in sauces. Salted french fries. Olives. Rosita Fire. Relishes. Sauerkraut. Salsa. Meat and Other Protein Products Salted, canned, smoked, spiced, or pickled meats, seafood, or fish. Bacon, ham, sausage, hot dogs, corned beef, chipped beef, and packaged luncheon meats. Salt pork. Jerky. Pickled herring. Anchovies, regular canned tuna, and sardines. Salted nuts. Dairy Processed cheese and cheese spreads. Cheese curds. Blue cheese and cottage cheese. Buttermilk.  Condiments Onion and garlic salt, seasoned salt, table salt, and sea salt. Canned and packaged gravies. Worcestershire sauce. Tartar sauce. Barbecue sauce. Teriyaki sauce. Soy sauce, including reduced sodium. Steak sauce. Fish sauce. Oyster sauce. Cocktail sauce. Horseradish. Regular ketchup and mustard. Meat flavorings and tenderizers. Bouillon cubes. Hot sauce. Tabasco sauce. Marinades. Taco seasonings. Relishes. Fats and Oils Regular salad dressings. Salted butter. Margarine. Ghee. Bacon fat.  Other Potato and tortilla chips. Corn chips and puffs. Salted popcorn and pretzels. Canned or dried soups. Pizza. Frozen entrees and pot pies.  The items listed above may not be a complete list of foods and beverages  to avoid. Contact your dietitian for more information. Document Released: 10/19/2001 Document Revised: 05/04/2013 Document Reviewed: 03/03/2013 [facility] Patient Information 2015 [place], [place]. This information is not intended to replace advice given to you by your health care provider. Make sure you discuss any questions you have with your health care provider.

## 2014-10-24 NOTE — Progress Notes (Signed)
Cardiology Office Note   Date:  10/24/2014   ID:  Pamela [name], DOB 03/09/1951, MRN [id number]  PCP:  Kristie Cowman, MD  Cardiologist:  Lesleigh Noe, MD   Chief Complaint  Patient presents with  . Follow-up    CAD      History of Present Illness: Pamela [name] is a 64 y.o. female who presents for coronary artery disease, diabetes mellitus, COPD, hypertension, chronic kidney disease stage IV and hyperlipidemia.  Most recent symptoms have been related to significant anemia. Anemia has been felt related to a combination of iron deficiency and chronic kidney disease. She is seen by Dr. Kathrene Bongo of nephrology. She had a recent upward adjustment in diuretic therapy. She denies orthopnea. She has occasional angina responsive to nitroglycerin. She continues to have lower extremity swelling but this seems to be improving after the diarrhetic adjustment.    Past Medical History  Diagnosis Date  . PONV (postoperative nausea and vomiting)   . Hypertension   . COPD (chronic obstructive pulmonary disease)   . Diabetes mellitus without complication 10-29-12  . Coronary artery disease     Past Surgical History  Procedure Laterality Date  . Abdominal hysterectomy    . Elbow surgery Right     tendon surgery  . Tubal ligation    . Breast surgery      cyst removed  . Cholecystectomy      '74-open  . Appendectomy      '74- open with gallbladder  . Cardiac catheterization      1 coronary stent placed  . Ganglion cyst excision Bilateral 10-29-12    wrist  . Anal fissure repair    . Colonoscopy with propofol N/A 11/17/2012    Procedure: COLONOSCOPY WITH PROPOFOL;  Surgeon: Charolett Bumpers, MD;  Location: [facility];  Service: Endoscopy;  Laterality: N/A;  . Esophagogastroduodenoscopy (egd) with propofol N/A 11/17/2012    Procedure: ESOPHAGOGASTRODUODENOSCOPY (EGD) WITH PROPOFOL;  Surgeon: Charolett Bumpers, MD;  Location: [facility];  Service: Endoscopy;  Laterality:  N/A;  . Left heart catheterization with coronary angiogram N/A 04/14/2014    Procedure: LEFT HEART CATHETERIZATION WITH CORONARY ANGIOGRAM;  Surgeon: Lesleigh Noe, MD;  Location: [facility] CATH LAB;  Service: Cardiovascular;  Laterality: N/A;     Current Outpatient Prescriptions  Medication Sig Dispense Refill  . ALPRAZolam (XANAX) 0.5 MG tablet Take 0.5 mg by mouth 2 (two) times daily.    Marland Kitchen amLODipine (NORVASC) 10 MG tablet Take 10 mg by mouth daily.    Marland Kitchen aspirin EC 81 MG tablet Take 81 mg by mouth daily.    Marland Kitchen atorvastatin (LIPITOR) 10 MG tablet Take 10 mg by mouth every morning.    . carvedilol (COREG) 25 MG tablet Take 1 tablet (25 mg total) by mouth 3 (three) times daily. 270 tablet 0  . furosemide (LASIX) 40 MG tablet Take 1 tablet by mouth  daily (Patient taking differently: Take 2 tablet by mouth  daily) 30 tablet 1  . glimepiride (AMARYL) 2 MG tablet Take 2 mg by mouth daily.    . iron polysaccharides (NIFEREX) 150 MG capsule Take 1 capsule (150 mg total) by mouth daily. 30 capsule 0  . isosorbide dinitrate (ISORDIL) 20 MG tablet Take 1 tablet (20 mg total) by mouth 3 (three) times daily. 270 tablet 0  . latanoprost (XALATAN) 0.005 % ophthalmic solution Place 1 drop into both eyes at bedtime.    . metFORMIN (GLUCOPHAGE) 500 MG tablet Take  500-1,000 mg by mouth 3 (three) times daily. Takes 2 in the morning 1 at lunch and 2 at night    . nitroGLYCERIN (NITROSTAT) 0.4 MG SL tablet Place 0.4 mg under the tongue every 5 (five) minutes as needed for chest pain.    . salmeterol (SEREVENT) 50 MCG/DOSE diskus inhaler Inhale 1 puff into the lungs 2 (two) times daily.     No current facility-administered medications for this visit.    Allergies:   Review of patient's allergies indicates no known allergies.    Social History:  The patient  reports that she quit smoking about 22 years ago. Her smoking use included Cigarettes. She smoked 1.50 packs per day. She does not have any smokeless tobacco  history on file. She reports that she does not drink alcohol or use illicit drugs.   Family History:  The patient's family history includes Cancer in her father and sister; Diabetes in her father and sister; Heart attack in her mother; Hypertension in her father, mother, sister, and sister.    ROS:  Please see the history of present illness.   Otherwise, review of systems are positive for anemia. Receiving iron infusion and close follow-up by nephrology. She denies blood in urine and stool. She noticed lower extremity edema..   All other systems are reviewed and negative.    PHYSICAL EXAM: VS:  Ht 5\' 1"  (1.549 m)  Wt 51.982 kg (114 lb 9.6 oz)  BMI 21.66 kg/m2 , BMI Body mass index is 21.66 kg/(m^2). GEN: Well nourished, well developed, in no acute distress HEENT: normal Neck: no JVD, carotid bruits, or masses Cardiac: RRR; no murmurs, rubs, or gallops. 2+ LE edema , mostly pedal Respiratory:  clear to auscultation bilaterally, normal work of breathing GI: soft, nontender, nondistended, + BS MS: no deformity or atrophy Skin: warm and dry, no rash Neuro:  Strength and sensation are intact Psych: euthymic mood, full affect   EKG:  EKG is not ordered today.    Recent Labs: 03/25/2014: ALT 13; Pro B Natriuretic peptide (BNP) 284.0*; TSH 2.030 04/11/2014: Platelets 279.0 10/11/2014: BUN 75*; Creatinine, Ser 2.89*; Hemoglobin 10.9*; Potassium 4.2; Sodium 136    Lipid Panel No results found for: CHOL, TRIG, HDL, CHOLHDL, VLDL, LDLCALC, LDLDIRECT    Wt Readings from Last 3 Encounters:  10/24/14 51.982 kg (114 lb 9.6 oz)  10/20/14 53.071 kg (117 lb)  05/30/14 51.256 kg (113 lb)      Other studies Reviewed: Additional studies/ records that were reviewed today include: None other than recent labs by nephrology   ASSESSMENT AND PLAN:  Coronary artery disease involving native coronary artery of native heart with unstable angina pectoris - stable angina controlled  Essential  hypertension -  Hyperlipidemia -no recent data  Stage IV chronic kidney disease- followed by nephrology  COPD, severe -     Current medicines are reviewed at length with the patient today.  The patient does not have concerns regarding medicines.  The following changes have been made:  Low-salt diet and care with volume intake. Use nitroglycerin if chest pain.  Labs/ tests ordered today include:  No orders of the defined types were placed in this encounter.     Disposition:   FU with HS in 1 year  Signed, Lesleigh Noe, MD  10/24/2014 8:19 AM    [facility] [address] [place], [place], [place]  [address] Phone: [phone]; Fax: [phone]

## 2014-11-07 ENCOUNTER — Other Ambulatory Visit (HOSPITAL_COMMUNITY): Payer: Self-pay | Admitting: *Deleted

## 2014-11-08 ENCOUNTER — Encounter (HOSPITAL_COMMUNITY)
Admission: RE | Admit: 2014-11-08 | Discharge: 2014-11-08 | Disposition: A | Discharge status: Home / Self Care | Payer: Medicare Other | Source: Ambulatory Visit | Attending: Nephrology | Admitting: Nephrology

## 2014-11-08 DIAGNOSIS — D509 Iron deficiency anemia, unspecified: Secondary | ICD-10-CM | POA: Diagnosis not present

## 2014-11-08 LAB — RENAL FUNCTION PANEL
ALBUMIN: 4.4 g/dL (ref 3.5–5.0)
ANION GAP: 15 (ref 5–15)
BUN: 91 mg/dL — AB (ref 6–20)
CHLORIDE: 93 mmol/L — AB (ref 101–111)
CO2: 25 mmol/L (ref 22–32)
CREATININE: 2.88 mg/dL — AB (ref 0.44–1.00)
Calcium: 9.1 mg/dL (ref 8.9–10.3)
GFR calc Af Amer: 19 mL/min — ABNORMAL LOW (ref >60)
GFR calc non Af Amer: 16 mL/min — ABNORMAL LOW (ref >60)
Glucose, Bld: 147 mg/dL — ABNORMAL HIGH (ref 65–99)
PHOSPHORUS: 3.2 mg/dL (ref 2.5–4.6)
Potassium: 4.3 mmol/L (ref 3.5–5.1)
Sodium: 133 mmol/L — ABNORMAL LOW (ref 135–145)

## 2014-11-08 LAB — IRON AND TIBC
IRON: 123 ug/dL (ref 28–170)
Saturation Ratios: 37 % — ABNORMAL HIGH (ref 10.4–31.8)
TIBC: 330 ug/dL (ref 250–450)
UIBC: 207 ug/dL

## 2014-11-08 LAB — POCT HEMOGLOBIN-HEMACUE: HEMOGLOBIN: 12.1 g/dL (ref 12.0–15.0)

## 2014-11-08 LAB — FERRITIN: Ferritin: 340 ng/mL — ABNORMAL HIGH (ref 11–307)

## 2014-11-08 MED ORDER — EPOETIN ALFA 20000 UNIT/ML IJ SOLN: 20000.0000 [IU] | INTRAMUSCULAR | Status: DC

## 2014-11-09 LAB — PTH, INTACT AND CALCIUM
Calcium, Total (PTH): 9.4 mg/dL (ref 8.7–10.3)
PTH: 93 pg/mL — AB (ref 15–65)

## 2014-11-22 ENCOUNTER — Encounter (HOSPITAL_COMMUNITY)
Admission: RE | Admit: 2014-11-22 | Discharge: 2014-11-22 | Disposition: A | Discharge status: Home / Self Care | Payer: Medicare Other | Source: Ambulatory Visit | Attending: Nephrology | Admitting: Nephrology

## 2014-11-22 DIAGNOSIS — Z5181 Encounter for therapeutic drug level monitoring: Secondary | ICD-10-CM | POA: Insufficient documentation

## 2014-11-22 DIAGNOSIS — D631 Anemia in chronic kidney disease: Secondary | ICD-10-CM | POA: Insufficient documentation

## 2014-11-22 DIAGNOSIS — N183 Chronic kidney disease, stage 3 (moderate): Secondary | ICD-10-CM | POA: Diagnosis not present

## 2014-11-22 LAB — POCT HEMOGLOBIN-HEMACUE: Hemoglobin: 12.4 g/dL (ref 12.0–15.0)

## 2014-11-22 MED ORDER — EPOETIN ALFA 20000 UNIT/ML IJ SOLN: 20000.0000 [IU] | INTRAMUSCULAR | Status: DC

## 2014-12-06 ENCOUNTER — Encounter (HOSPITAL_COMMUNITY)
Admission: RE | Admit: 2014-12-06 | Discharge: 2014-12-06 | Disposition: A | Discharge status: Home / Self Care | Payer: Medicare Other | Source: Ambulatory Visit | Attending: Nephrology | Admitting: Nephrology

## 2014-12-06 DIAGNOSIS — D631 Anemia in chronic kidney disease: Secondary | ICD-10-CM | POA: Diagnosis not present

## 2014-12-06 LAB — RENAL FUNCTION PANEL
Albumin: 4.7 g/dL (ref 3.5–5.0)
Anion gap: 17 — ABNORMAL HIGH (ref 5–15)
BUN: 92 mg/dL — ABNORMAL HIGH (ref 6–20)
CALCIUM: 9.6 mg/dL (ref 8.9–10.3)
CO2: 22 mmol/L (ref 22–32)
CREATININE: 3.2 mg/dL — AB (ref 0.44–1.00)
Chloride: 92 mmol/L — ABNORMAL LOW (ref 101–111)
GFR calc Af Amer: 17 mL/min — ABNORMAL LOW (ref >60)
GFR calc non Af Amer: 14 mL/min — ABNORMAL LOW (ref >60)
Glucose, Bld: 136 mg/dL — ABNORMAL HIGH (ref 65–99)
PHOSPHORUS: 3.8 mg/dL (ref 2.5–4.6)
Potassium: 4 mmol/L (ref 3.5–5.1)
SODIUM: 131 mmol/L — AB (ref 135–145)

## 2014-12-06 LAB — IRON AND TIBC
IRON: 114 ug/dL (ref 28–170)
SATURATION RATIOS: 32 % — AB (ref 10.4–31.8)
TIBC: 356 ug/dL (ref 250–450)
UIBC: 242 ug/dL

## 2014-12-06 LAB — HEMOGLOBIN AND HEMATOCRIT, BLOOD
HCT: 35.1 % — ABNORMAL LOW (ref 36.0–46.0)
Hemoglobin: 12.3 g/dL (ref 12.0–15.0)

## 2014-12-06 LAB — FERRITIN: FERRITIN: 365 ng/mL — AB (ref 11–307)

## 2014-12-06 MED ORDER — EPOETIN ALFA 20000 UNIT/ML IJ SOLN: 20000.0000 [IU] | INTRAMUSCULAR | Status: DC

## 2014-12-07 LAB — PTH, INTACT AND CALCIUM
CALCIUM TOTAL (PTH): 9.4 mg/dL (ref 8.7–10.3)
PTH: 102 pg/mL — AB (ref 15–65)

## 2014-12-21 ENCOUNTER — Encounter (HOSPITAL_COMMUNITY)
Admission: RE | Admit: 2014-12-21 | Discharge: 2014-12-21 | Disposition: A | Discharge status: Home / Self Care | Payer: Medicare Other | Source: Ambulatory Visit | Attending: Nephrology | Admitting: Nephrology

## 2014-12-21 DIAGNOSIS — N183 Chronic kidney disease, stage 3 (moderate): Secondary | ICD-10-CM | POA: Diagnosis not present

## 2014-12-21 DIAGNOSIS — D631 Anemia in chronic kidney disease: Secondary | ICD-10-CM | POA: Diagnosis present

## 2014-12-21 DIAGNOSIS — Z5181 Encounter for therapeutic drug level monitoring: Secondary | ICD-10-CM | POA: Insufficient documentation

## 2014-12-21 MED ORDER — EPOETIN ALFA 20000 UNIT/ML IJ SOLN
INTRAMUSCULAR | Status: AC
  Filled 2014-12-21: qty 1

## 2014-12-21 MED ORDER — EPOETIN ALFA 20000 UNIT/ML IJ SOLN
20000.0000 [IU] | INTRAMUSCULAR | Status: DC
  Administered 2014-12-21: 20000 [IU] via SUBCUTANEOUS

## 2014-12-26 LAB — POCT HEMOGLOBIN-HEMACUE: Hemoglobin: 11.1 g/dL — ABNORMAL LOW (ref 12.0–15.0)

## 2014-12-30 ENCOUNTER — Other Ambulatory Visit (HOSPITAL_COMMUNITY): Payer: Self-pay | Admitting: Nephrology

## 2014-12-30 DIAGNOSIS — R809 Proteinuria, unspecified: Secondary | ICD-10-CM

## 2015-01-02 ENCOUNTER — Other Ambulatory Visit: Payer: Self-pay | Admitting: Nephrology

## 2015-01-02 DIAGNOSIS — N183 Chronic kidney disease, stage 3 unspecified: Secondary | ICD-10-CM

## 2015-01-11 ENCOUNTER — Ambulatory Visit (HOSPITAL_COMMUNITY): Payer: Medicare Other

## 2015-01-11 ENCOUNTER — Other Ambulatory Visit: Payer: Self-pay | Admitting: Physician Assistant

## 2015-01-12 ENCOUNTER — Encounter (HOSPITAL_COMMUNITY): Payer: Self-pay

## 2015-01-12 ENCOUNTER — Ambulatory Visit (HOSPITAL_COMMUNITY)
Admission: RE | Admit: 2015-01-12 | Discharge: 2015-01-12 | Disposition: A | Discharge status: Home / Self Care | Payer: Medicare Other | Source: Ambulatory Visit | Attending: Nephrology | Admitting: Nephrology

## 2015-01-12 DIAGNOSIS — D6959 Other secondary thrombocytopenia: Secondary | ICD-10-CM | POA: Diagnosis present

## 2015-01-12 DIAGNOSIS — J441 Chronic obstructive pulmonary disease with (acute) exacerbation: Secondary | ICD-10-CM | POA: Diagnosis not present

## 2015-01-12 DIAGNOSIS — Z8249 Family history of ischemic heart disease and other diseases of the circulatory system: Secondary | ICD-10-CM

## 2015-01-12 DIAGNOSIS — I2510 Atherosclerotic heart disease of native coronary artery without angina pectoris: Secondary | ICD-10-CM | POA: Diagnosis present

## 2015-01-12 DIAGNOSIS — N186 End stage renal disease: Secondary | ICD-10-CM | POA: Diagnosis not present

## 2015-01-12 DIAGNOSIS — I255 Ischemic cardiomyopathy: Secondary | ICD-10-CM | POA: Diagnosis present

## 2015-01-12 DIAGNOSIS — E785 Hyperlipidemia, unspecified: Secondary | ICD-10-CM | POA: Diagnosis present

## 2015-01-12 DIAGNOSIS — Z7982 Long term (current) use of aspirin: Secondary | ICD-10-CM

## 2015-01-12 DIAGNOSIS — E872 Acidosis: Secondary | ICD-10-CM | POA: Diagnosis present

## 2015-01-12 DIAGNOSIS — A419 Sepsis, unspecified organism: Principal | ICD-10-CM | POA: Diagnosis present

## 2015-01-12 DIAGNOSIS — R001 Bradycardia, unspecified: Secondary | ICD-10-CM | POA: Diagnosis present

## 2015-01-12 DIAGNOSIS — N2581 Secondary hyperparathyroidism of renal origin: Secondary | ICD-10-CM | POA: Diagnosis present

## 2015-01-12 DIAGNOSIS — N390 Urinary tract infection, site not specified: Secondary | ICD-10-CM | POA: Diagnosis present

## 2015-01-12 DIAGNOSIS — I129 Hypertensive chronic kidney disease with stage 1 through stage 4 chronic kidney disease, or unspecified chronic kidney disease: Secondary | ICD-10-CM | POA: Diagnosis present

## 2015-01-12 DIAGNOSIS — E875 Hyperkalemia: Secondary | ICD-10-CM | POA: Diagnosis present

## 2015-01-12 DIAGNOSIS — D631 Anemia in chronic kidney disease: Secondary | ICD-10-CM | POA: Diagnosis present

## 2015-01-12 DIAGNOSIS — I120 Hypertensive chronic kidney disease with stage 5 chronic kidney disease or end stage renal disease: Secondary | ICD-10-CM | POA: Diagnosis present

## 2015-01-12 DIAGNOSIS — I82612 Acute embolism and thrombosis of superficial veins of left upper extremity: Secondary | ICD-10-CM | POA: Diagnosis present

## 2015-01-12 DIAGNOSIS — R531 Weakness: Secondary | ICD-10-CM | POA: Diagnosis not present

## 2015-01-12 DIAGNOSIS — R6521 Severe sepsis with septic shock: Secondary | ICD-10-CM | POA: Diagnosis present

## 2015-01-12 DIAGNOSIS — Z833 Family history of diabetes mellitus: Secondary | ICD-10-CM

## 2015-01-12 DIAGNOSIS — R680 Hypothermia, not associated with low environmental temperature: Secondary | ICD-10-CM | POA: Diagnosis present

## 2015-01-12 DIAGNOSIS — L8990 Pressure ulcer of unspecified site, unspecified stage: Secondary | ICD-10-CM

## 2015-01-12 DIAGNOSIS — Z87891 Personal history of nicotine dependence: Secondary | ICD-10-CM

## 2015-01-12 DIAGNOSIS — J9601 Acute respiratory failure with hypoxia: Secondary | ICD-10-CM | POA: Diagnosis present

## 2015-01-12 DIAGNOSIS — F419 Anxiety disorder, unspecified: Secondary | ICD-10-CM | POA: Diagnosis present

## 2015-01-12 DIAGNOSIS — E876 Hypokalemia: Secondary | ICD-10-CM | POA: Diagnosis not present

## 2015-01-12 DIAGNOSIS — E871 Hypo-osmolality and hyponatremia: Secondary | ICD-10-CM | POA: Diagnosis not present

## 2015-01-12 DIAGNOSIS — E1165 Type 2 diabetes mellitus with hyperglycemia: Secondary | ICD-10-CM | POA: Diagnosis present

## 2015-01-12 DIAGNOSIS — R809 Proteinuria, unspecified: Secondary | ICD-10-CM | POA: Insufficient documentation

## 2015-01-12 DIAGNOSIS — N179 Acute kidney failure, unspecified: Secondary | ICD-10-CM | POA: Diagnosis present

## 2015-01-12 DIAGNOSIS — E1122 Type 2 diabetes mellitus with diabetic chronic kidney disease: Secondary | ICD-10-CM | POA: Diagnosis present

## 2015-01-12 DIAGNOSIS — G92 Toxic encephalopathy: Secondary | ICD-10-CM | POA: Diagnosis present

## 2015-01-12 DIAGNOSIS — I5043 Acute on chronic combined systolic (congestive) and diastolic (congestive) heart failure: Secondary | ICD-10-CM | POA: Diagnosis not present

## 2015-01-12 DIAGNOSIS — E1151 Type 2 diabetes mellitus with diabetic peripheral angiopathy without gangrene: Secondary | ICD-10-CM | POA: Diagnosis present

## 2015-01-12 LAB — PROTIME-INR
INR: 1.1 (ref 0.00–1.49)
PROTHROMBIN TIME: 14.4 s (ref 11.6–15.2)

## 2015-01-12 LAB — CBC
HEMATOCRIT: 28.8 % — AB (ref 36.0–46.0)
HEMOGLOBIN: 9.4 g/dL — AB (ref 12.0–15.0)
MCH: 29.5 pg (ref 26.0–34.0)
MCHC: 32.6 g/dL (ref 30.0–36.0)
MCV: 90.3 fL (ref 78.0–100.0)
Platelets: 150 10*3/uL (ref 150–400)
RBC: 3.19 MIL/uL — ABNORMAL LOW (ref 3.87–5.11)
RDW: 15.3 % (ref 11.5–15.5)
WBC: 6 10*3/uL (ref 4.0–10.5)

## 2015-01-12 LAB — GLUCOSE, CAPILLARY: Glucose-Capillary: 216 mg/dL — ABNORMAL HIGH (ref 65–99)

## 2015-01-12 LAB — APTT: aPTT: 30 seconds (ref 24–37)

## 2015-01-12 MED ORDER — MIDAZOLAM HCL 2 MG/2ML IJ SOLN
INTRAMUSCULAR | Status: AC | PRN
  Administered 2015-01-12: 1 mg via INTRAVENOUS

## 2015-01-12 MED ORDER — MIDAZOLAM HCL 2 MG/2ML IJ SOLN
INTRAMUSCULAR | Status: AC
  Filled 2015-01-12: qty 2

## 2015-01-12 MED ORDER — GELATIN ABSORBABLE 12-7 MM EX MISC
CUTANEOUS | Status: AC
  Filled 2015-01-12: qty 1

## 2015-01-12 MED ORDER — SODIUM CHLORIDE 0.9 % IV SOLN: INTRAVENOUS | Status: DC

## 2015-01-12 MED ORDER — LIDOCAINE-EPINEPHRINE 1 %-1:100000 IJ SOLN
INTRAMUSCULAR | Status: AC
  Filled 2015-01-12: qty 1

## 2015-01-12 MED ORDER — FENTANYL CITRATE (PF) 100 MCG/2ML IJ SOLN
INTRAMUSCULAR | Status: AC
  Filled 2015-01-12: qty 2

## 2015-01-12 MED ORDER — FENTANYL CITRATE (PF) 100 MCG/2ML IJ SOLN
INTRAMUSCULAR | Status: AC | PRN
  Administered 2015-01-12: 50 ug via INTRAVENOUS

## 2015-01-12 NOTE — Sedation Documentation (Signed)
Patient is resting comfortably. 

## 2015-01-12 NOTE — H&P (Signed)
Chief Complaint: Patient was seen in consultation today for a random renal biopsy at the request of Goldsborough,Kellie  Referring Physician(s): Goldsborough,Kellie  History of Present Illness: Pamela [name] is a 64 y.o. female who was found to have proteinuria is here today for random renal biopsy.  [name] has progressively worsening renal function (CKD stage 4) with a most recent creatinine of 3.2 and GFR of 14.  Other medical history includes COPD, DM, HTN, and CAD  She has no complaints today and feels pretty well.  She denies recent illness, no fever or chills, no N/V  Past Medical History  Diagnosis Date  . PONV (postoperative nausea and vomiting)   . Hypertension   . COPD (chronic obstructive pulmonary disease)   . Diabetes mellitus without complication 10-29-12  . Coronary artery disease     Past Surgical History  Procedure Laterality Date  . Abdominal hysterectomy    . Elbow surgery Right     tendon surgery  . Tubal ligation    . Breast surgery      cyst removed  . Cholecystectomy      '74-open  . Appendectomy      '74- open with gallbladder  . Cardiac catheterization      1 coronary stent placed  . Ganglion cyst excision Bilateral 10-29-12    wrist  . Anal fissure repair    . Colonoscopy with propofol N/A 11/17/2012    Procedure: COLONOSCOPY WITH PROPOFOL;  Surgeon: Charolett Bumpers, MD;  Location: [facility];  Service: Endoscopy;  Laterality: N/A;  . Esophagogastroduodenoscopy (egd) with propofol N/A 11/17/2012    Procedure: ESOPHAGOGASTRODUODENOSCOPY (EGD) WITH PROPOFOL;  Surgeon: Charolett Bumpers, MD;  Location: [facility];  Service: Endoscopy;  Laterality: N/A;  . Left heart catheterization with coronary angiogram N/A 04/14/2014    Procedure: LEFT HEART CATHETERIZATION WITH CORONARY ANGIOGRAM;  Surgeon: Lesleigh Noe, MD;  Location: [facility] CATH LAB;  Service: Cardiovascular;  Laterality: N/A;    Allergies: Review of patient's allergies  indicates no known allergies.  Medications: Prior to Admission medications   Medication Sig Start Date End Date Taking? Authorizing Provider  ALPRAZolam Prudy Feeler) 0.5 MG tablet Take 0.5 mg by mouth 2 (two) times daily.   Yes Historical Provider, MD  amLODipine (NORVASC) 10 MG tablet Take 10 mg by mouth daily. 09/02/14  Yes Historical Provider, MD  aspirin EC 81 MG tablet Take 81 mg by mouth daily.   Yes Historical Provider, MD  atorvastatin (LIPITOR) 10 MG tablet Take 10 mg by mouth every morning.   Yes Historical Provider, MD  carvedilol (COREG) 25 MG tablet Take 1 tablet (25 mg total) by mouth 3 (three) times daily. 04/20/14  Yes Lyn Records, MD  furosemide (LASIX) 40 MG tablet Take 1 tablet by mouth  daily Patient taking differently: Take 2 tablet by mouth  daily 07/19/14  Yes Lyn Records, MD  glimepiride (AMARYL) 2 MG tablet Take 2 mg by mouth daily. 09/02/14  Yes Historical Provider, MD  iron polysaccharides (NIFEREX) 150 MG capsule Take 1 capsule (150 mg total) by mouth daily. 03/28/14  Yes Rhetta Mura, MD  isosorbide dinitrate (ISORDIL) 20 MG tablet Take 1 tablet (20 mg total) by mouth 3 (three) times daily. 07/29/14  Yes Lyn Records, MD  latanoprost (XALATAN) 0.005 % ophthalmic solution Place 1 drop into both eyes at bedtime.   Yes Historical Provider, MD  metFORMIN (GLUCOPHAGE) 500 MG tablet Take 500-1,000 mg by mouth 3 (  three) times daily. Takes 1000 mg in the morning, 500 mg at lunch, and 1000 mg at night   Yes Historical Provider, MD  nitroGLYCERIN (NITROSTAT) 0.4 MG SL tablet Place 0.4 mg under the tongue every 5 (five) minutes as needed for chest pain.   Yes Historical Provider, MD  salmeterol (SEREVENT) 50 MCG/DOSE diskus inhaler Inhale 1 puff into the lungs 2 (two) times daily.   Yes Historical Provider, MD     Family History  Problem Relation Age of Onset  . Diabetes Father   . Cancer Father   . Heart attack Mother   . Hypertension Mother   . Hypertension Father   .  Diabetes Sister   . Hypertension Sister   . Cancer Sister   . Hypertension Sister     Social History   Social History  . Marital Status: Married    Spouse Name: N/A  . Number of Children: N/A  . Years of Education: N/A   Social History Main Topics  . Smoking status: Former Smoker -- 1.50 packs/day    Types: Cigarettes    Quit date: 10/29/1992  . Smokeless tobacco: None  . Alcohol Use: No  . Drug Use: No  . Sexual Activity: Not Currently   Other Topics Concern  . None   Social History Narrative     Review of Systems  Constitutional: Negative for fever, chills, activity change, appetite change and fatigue.  HENT: Negative.   Respiratory: Positive for cough and wheezing. Negative for chest tightness.   Cardiovascular: Negative for chest pain.  Gastrointestinal: Negative for nausea, vomiting and abdominal pain.  Genitourinary: Negative.   Musculoskeletal: Negative.   Skin: Negative.   Neurological: Negative.   Psychiatric/Behavioral: Negative.     Vital Signs: BP 125/47 mmHg  Pulse 87  Temp(Src) 97.6 F (36.4 C)  Resp 18  Ht  (1.549 m)  Wt 112 lb (50.803 kg)  BMI 21.17 kg/m2  SpO2 97%  Physical Exam  Constitutional: She is oriented to person, place, and time. She appears well-developed and well-nourished.  HENT:  Head: Normocephalic and atraumatic.  Eyes: EOM are normal.  Neck: Normal range of motion. Neck supple.  Cardiovascular: Normal rate, regular rhythm and normal heart sounds.   Pulmonary/Chest: Effort normal. She has wheezes.  Abdominal: Soft. Bowel sounds are normal. She exhibits no distension. There is no tenderness.  Musculoskeletal: Normal range of motion.  Neurological: She is alert and oriented to person, place, and time.  Skin: Skin is warm and dry.  Psychiatric: She has a normal mood and affect. Her behavior is normal. Judgment and thought content normal.  Vitals reviewed.   Mallampati Score:  MD Evaluation Airway: WNL Heart:  WNL Abdomen: WNL Chest/ Lungs: Other (comments) Chest/ lungs comments: Expiratory wheezes, pt has COPD. Used inhalers this morning ASA  Classification: 3 Mallampati/Airway Score: One  Imaging: No results found.  Labs:  CBC:  Recent Labs  03/27/14 0515 03/28/14 0525 04/11/14 1129  11/22/14 0807 12/06/14 0821 12/21/14 0809 01/12/15 0854  WBC 6.6 6.3 6.8  --   --   --   --  6.0  HGB 8.9* 9.4* 10.1*  < > 12.4 12.3 11.1* 9.4*  HCT 28.0* 30.1* 32.5*  --   --  35.1*  --  28.8*  PLT 236 241 279.0  --   --   --   --  150  < > = values in this interval not displayed.  COAGS:  Recent Labs  04/14/14 [phone]  INR 0.98    BMP:  Recent Labs  09/12/14 0818 10/11/14 0840 11/08/14 0830 12/06/14 0821  NA 133* 136 133* 131*  K 4.5 4.2 4.3 4.0  CL 96* 95* 93* 92*  CO2 21* 21* 25 22  GLUCOSE 164* 159* 147* 136*  BUN 86* 75* 91* 92*  CALCIUM 9.1 8.9  9.2 9.1  9.4 9.6  9.4  CREATININE 2.71* 2.89* 2.88* 3.20*  GFRNONAA 17* 16* 16* 14*  GFRAA 20* 19* 19* 17*    LIVER FUNCTION TESTS:  Recent Labs  03/25/14 1430  09/12/14 0818 10/11/14 0840 11/08/14 0830 12/06/14 0821  BILITOT 0.3  --   --   --   --   --   AST 20  --   --   --   --   --   ALT 13  --   --   --   --   --   ALKPHOS 46  --   --   --   --   --   PROT 6.8  --   --   --   --   --   ALBUMIN 3.9  < > 4.0 4.3 4.4 4.7  < > = values in this interval not displayed.  TUMOR MARKERS: No results for input(s): AFPTM, CEA, CA199, CHROMGRNA in the last 8760 hours.  Assessment and Plan:  CKD stage 4 and Proteinuria  Will proceed with image guided random renal biopsy today by Dr. Grace Isaac.  Risks and Benefits discussed with the patient including, but not limited to bleeding, infection, damage to adjacent structures or low yield requiring additional tests.  All of the patient's questions were answered, patient is agreeable to proceed. Consent signed and in chart.  Thank you for this interesting consult.  I greatly  enjoyed meeting MCKAYLAH BETTENDORF and look forward to participating in their care.  A copy of this report was sent to the requesting provider on this date.  Signed: Gwynneth Macleod PA-C 01/12/2015, 9:09 AM   I spent a total of  30 Minutes  in face to face in clinical consultation, greater than 50% of which was counseling/coordinating care for image guided random renal biopsy

## 2015-01-12 NOTE — Discharge Instructions (Signed)
Kidney Biopsy, Care After °Refer to this sheet in the next few weeks. These instructions provide you with information on caring for yourself after your procedure. Your health care provider may also give you more specific instructions. Your treatment has been planned according to current medical practices, but problems sometimes occur. Call your health care provider if you have any problems or questions after your procedure.  °WHAT TO EXPECT AFTER THE PROCEDURE  °· You may notice blood in the urine for the first 24 hours after the biopsy. °· You may feel some pain at the biopsy site for 1-2 weeks after the biopsy. °HOME CARE INSTRUCTIONS °· Do not lift anything heavier than 10 lb (4.5 kg) for 2 weeks. °· Do not take any non-steroidal anti-inflammatory drugs (NSAIDs) or any blood thinners for a week after the biopsy unless instructed to do so by your health care provider. °· Only take medicines for pain, fever, or discomfort as directed by your health care provider. °SEEK MEDICAL CARE IF: °· You have bloody urine more than 24 hours after the biopsy.   °· You develop a fever.   °· You cannot urinate.   °· You have increasing pain at the biopsy site.   °SEEK IMMEDIATE MEDICAL CARE IF: °You feel faint or dizzy.  °Document Released: 12/30/2012 Document Reviewed: 12/30/2012 °[employer]® Patient Information ©2015 [employer]. This information is not intended to replace advice given to you by your health care provider. Make sure you discuss any questions you have with your health care provider. ° °

## 2015-01-12 NOTE — Sedation Documentation (Signed)
Patient denies pain and is resting comfortably.  

## 2015-01-12 NOTE — Procedures (Signed)
Technically successful US guided biopsy of inferior pole of the left kidny. Procedure complicated by development of a tiny, non-enlarging and asymptomatic perinephric hematoma.   Otherwise, no immediate complications.   Katherina Right, MD Pager #: [phone]

## 2015-01-13 ENCOUNTER — Other Ambulatory Visit (HOSPITAL_COMMUNITY): Payer: Self-pay | Admitting: *Deleted

## 2015-01-14 ENCOUNTER — Inpatient Hospital Stay (HOSPITAL_COMMUNITY): Payer: Medicare Other

## 2015-01-14 ENCOUNTER — Inpatient Hospital Stay (HOSPITAL_COMMUNITY)
Admission: EM | Admit: 2015-01-14 | Discharge: 2015-02-01 | DRG: 853 | Disposition: A | Discharge status: Home / Self Care | Payer: Medicare Other | Attending: Family Medicine | Admitting: Family Medicine

## 2015-01-14 ENCOUNTER — Emergency Department (HOSPITAL_COMMUNITY): Payer: Medicare Other

## 2015-01-14 ENCOUNTER — Encounter (HOSPITAL_COMMUNITY): Payer: Self-pay

## 2015-01-14 DIAGNOSIS — I129 Hypertensive chronic kidney disease with stage 1 through stage 4 chronic kidney disease, or unspecified chronic kidney disease: Secondary | ICD-10-CM | POA: Diagnosis present

## 2015-01-14 DIAGNOSIS — N390 Urinary tract infection, site not specified: Secondary | ICD-10-CM | POA: Diagnosis present

## 2015-01-14 DIAGNOSIS — A419 Sepsis, unspecified organism: Secondary | ICD-10-CM | POA: Diagnosis present

## 2015-01-14 DIAGNOSIS — Z833 Family history of diabetes mellitus: Secondary | ICD-10-CM | POA: Diagnosis not present

## 2015-01-14 DIAGNOSIS — N179 Acute kidney failure, unspecified: Secondary | ICD-10-CM | POA: Diagnosis present

## 2015-01-14 DIAGNOSIS — I10 Essential (primary) hypertension: Secondary | ICD-10-CM | POA: Diagnosis not present

## 2015-01-14 DIAGNOSIS — F419 Anxiety disorder, unspecified: Secondary | ICD-10-CM | POA: Diagnosis present

## 2015-01-14 DIAGNOSIS — J410 Simple chronic bronchitis: Secondary | ICD-10-CM | POA: Diagnosis not present

## 2015-01-14 DIAGNOSIS — N2581 Secondary hyperparathyroidism of renal origin: Secondary | ICD-10-CM | POA: Diagnosis present

## 2015-01-14 DIAGNOSIS — R6521 Severe sepsis with septic shock: Secondary | ICD-10-CM | POA: Diagnosis present

## 2015-01-14 DIAGNOSIS — J449 Chronic obstructive pulmonary disease, unspecified: Secondary | ICD-10-CM | POA: Insufficient documentation

## 2015-01-14 DIAGNOSIS — R0600 Dyspnea, unspecified: Secondary | ICD-10-CM | POA: Diagnosis not present

## 2015-01-14 DIAGNOSIS — G92 Toxic encephalopathy: Secondary | ICD-10-CM | POA: Diagnosis present

## 2015-01-14 DIAGNOSIS — E875 Hyperkalemia: Secondary | ICD-10-CM

## 2015-01-14 DIAGNOSIS — E872 Acidosis, unspecified: Secondary | ICD-10-CM

## 2015-01-14 DIAGNOSIS — L8990 Pressure ulcer of unspecified site, unspecified stage: Secondary | ICD-10-CM | POA: Insufficient documentation

## 2015-01-14 DIAGNOSIS — E119 Type 2 diabetes mellitus without complications: Secondary | ICD-10-CM

## 2015-01-14 DIAGNOSIS — J430 Unilateral pulmonary emphysema [MacLeod's syndrome]: Secondary | ICD-10-CM | POA: Diagnosis not present

## 2015-01-14 DIAGNOSIS — R531 Weakness: Secondary | ICD-10-CM | POA: Diagnosis present

## 2015-01-14 DIAGNOSIS — I255 Ischemic cardiomyopathy: Secondary | ICD-10-CM | POA: Diagnosis present

## 2015-01-14 DIAGNOSIS — R059 Cough, unspecified: Secondary | ICD-10-CM

## 2015-01-14 DIAGNOSIS — I5022 Chronic systolic (congestive) heart failure: Secondary | ICD-10-CM | POA: Insufficient documentation

## 2015-01-14 DIAGNOSIS — Z992 Dependence on renal dialysis: Secondary | ICD-10-CM

## 2015-01-14 DIAGNOSIS — R609 Edema, unspecified: Secondary | ICD-10-CM | POA: Insufficient documentation

## 2015-01-14 DIAGNOSIS — Z87891 Personal history of nicotine dependence: Secondary | ICD-10-CM | POA: Diagnosis not present

## 2015-01-14 DIAGNOSIS — R05 Cough: Secondary | ICD-10-CM

## 2015-01-14 DIAGNOSIS — J441 Chronic obstructive pulmonary disease with (acute) exacerbation: Secondary | ICD-10-CM | POA: Insufficient documentation

## 2015-01-14 DIAGNOSIS — I120 Hypertensive chronic kidney disease with stage 5 chronic kidney disease or end stage renal disease: Secondary | ICD-10-CM | POA: Diagnosis present

## 2015-01-14 DIAGNOSIS — E871 Hypo-osmolality and hyponatremia: Secondary | ICD-10-CM | POA: Diagnosis not present

## 2015-01-14 DIAGNOSIS — N19 Unspecified kidney failure: Secondary | ICD-10-CM

## 2015-01-14 DIAGNOSIS — E1151 Type 2 diabetes mellitus with diabetic peripheral angiopathy without gangrene: Secondary | ICD-10-CM | POA: Diagnosis present

## 2015-01-14 DIAGNOSIS — R680 Hypothermia, not associated with low environmental temperature: Secondary | ICD-10-CM | POA: Diagnosis present

## 2015-01-14 DIAGNOSIS — D6959 Other secondary thrombocytopenia: Secondary | ICD-10-CM | POA: Diagnosis present

## 2015-01-14 DIAGNOSIS — E1165 Type 2 diabetes mellitus with hyperglycemia: Secondary | ICD-10-CM | POA: Diagnosis present

## 2015-01-14 DIAGNOSIS — N189 Chronic kidney disease, unspecified: Secondary | ICD-10-CM | POA: Diagnosis present

## 2015-01-14 DIAGNOSIS — N185 Chronic kidney disease, stage 5: Secondary | ICD-10-CM | POA: Diagnosis not present

## 2015-01-14 DIAGNOSIS — Z8249 Family history of ischemic heart disease and other diseases of the circulatory system: Secondary | ICD-10-CM | POA: Diagnosis not present

## 2015-01-14 DIAGNOSIS — G9340 Encephalopathy, unspecified: Secondary | ICD-10-CM

## 2015-01-14 DIAGNOSIS — I339 Acute and subacute endocarditis, unspecified: Secondary | ICD-10-CM | POA: Diagnosis not present

## 2015-01-14 DIAGNOSIS — E785 Hyperlipidemia, unspecified: Secondary | ICD-10-CM | POA: Diagnosis present

## 2015-01-14 DIAGNOSIS — I2510 Atherosclerotic heart disease of native coronary artery without angina pectoris: Secondary | ICD-10-CM | POA: Diagnosis present

## 2015-01-14 DIAGNOSIS — J438 Other emphysema: Secondary | ICD-10-CM | POA: Diagnosis not present

## 2015-01-14 DIAGNOSIS — N170 Acute kidney failure with tubular necrosis: Secondary | ICD-10-CM | POA: Diagnosis not present

## 2015-01-14 DIAGNOSIS — Z7982 Long term (current) use of aspirin: Secondary | ICD-10-CM | POA: Diagnosis not present

## 2015-01-14 DIAGNOSIS — I82612 Acute embolism and thrombosis of superficial veins of left upper extremity: Secondary | ICD-10-CM | POA: Diagnosis present

## 2015-01-14 DIAGNOSIS — Z9289 Personal history of other medical treatment: Secondary | ICD-10-CM

## 2015-01-14 DIAGNOSIS — R001 Bradycardia, unspecified: Secondary | ICD-10-CM | POA: Diagnosis present

## 2015-01-14 DIAGNOSIS — N186 End stage renal disease: Secondary | ICD-10-CM | POA: Diagnosis not present

## 2015-01-14 DIAGNOSIS — Z4659 Encounter for fitting and adjustment of other gastrointestinal appliance and device: Secondary | ICD-10-CM

## 2015-01-14 DIAGNOSIS — E876 Hypokalemia: Secondary | ICD-10-CM | POA: Diagnosis not present

## 2015-01-14 DIAGNOSIS — J9601 Acute respiratory failure with hypoxia: Secondary | ICD-10-CM | POA: Diagnosis present

## 2015-01-14 DIAGNOSIS — E1122 Type 2 diabetes mellitus with diabetic chronic kidney disease: Secondary | ICD-10-CM | POA: Diagnosis present

## 2015-01-14 DIAGNOSIS — R0602 Shortness of breath: Secondary | ICD-10-CM

## 2015-01-14 DIAGNOSIS — D631 Anemia in chronic kidney disease: Secondary | ICD-10-CM | POA: Diagnosis present

## 2015-01-14 DIAGNOSIS — I5043 Acute on chronic combined systolic (congestive) and diastolic (congestive) heart failure: Secondary | ICD-10-CM | POA: Diagnosis not present

## 2015-01-14 HISTORY — DX: Chronic kidney disease, unspecified: N18.9

## 2015-01-14 HISTORY — DX: Reserved for inherently not codable concepts without codable children: IMO0001

## 2015-01-14 HISTORY — DX: Anxiety disorder, unspecified: F41.9

## 2015-01-14 LAB — CBC WITH DIFFERENTIAL/PLATELET
BASOS ABS: 0 10*3/uL (ref 0.0–0.1)
Basophils Relative: 0 % (ref 0–1)
EOS ABS: 0 10*3/uL (ref 0.0–0.7)
Eosinophils Relative: 0 % (ref 0–5)
HCT: 27.5 % — ABNORMAL LOW (ref 36.0–46.0)
HEMOGLOBIN: 8.7 g/dL — AB (ref 12.0–15.0)
LYMPHS ABS: 0.3 10*3/uL — AB (ref 0.7–4.0)
Lymphocytes Relative: 7 % — ABNORMAL LOW (ref 12–46)
MCH: 30.6 pg (ref 26.0–34.0)
MCHC: 31.6 g/dL (ref 30.0–36.0)
MCV: 96.8 fL (ref 78.0–100.0)
MONOS PCT: 4 % (ref 3–12)
Monocytes Absolute: 0.2 10*3/uL (ref 0.1–1.0)
NEUTROS ABS: 4.2 10*3/uL (ref 1.7–7.7)
Neutrophils Relative %: 89 % — ABNORMAL HIGH (ref 43–77)
PLATELETS: 142 10*3/uL — AB (ref 150–400)
RBC: 2.84 MIL/uL — AB (ref 3.87–5.11)
RDW: 15.5 % (ref 11.5–15.5)
WBC: 4.7 10*3/uL (ref 4.0–10.5)

## 2015-01-14 LAB — POCT I-STAT 3, ART BLOOD GAS (G3+)
ACID-BASE DEFICIT: 29 mmol/L — AB (ref 0.0–2.0)
ACID-BASE DEFICIT: 22 mmol/L — AB (ref 0.0–2.0)
Acid-base deficit: 22 mmol/L — ABNORMAL HIGH (ref 0.0–2.0)
Acid-base deficit: 23 mmol/L — ABNORMAL HIGH (ref 0.0–2.0)
BICARBONATE: 8.7 meq/L — AB (ref 20.0–24.0)
Bicarbonate: 5.2 mEq/L — ABNORMAL LOW (ref 20.0–24.0)
Bicarbonate: 8.5 mEq/L — ABNORMAL LOW (ref 20.0–24.0)
Bicarbonate: 7.5 mEq/L — ABNORMAL LOW (ref 20.0–24.0)
O2 SAT: 100 %
O2 SAT: 100 %
O2 Saturation: 100 %
O2 Saturation: 99 %
PCO2 ART: 30 mmHg — AB (ref 35.0–45.0)
PCO2 ART: 35.5 mmHg (ref 35.0–45.0)
PH ART: 6.811 — AB (ref 7.350–7.450)
PO2 ART: 421 mmHg — AB (ref 80.0–100.0)
Patient temperature: 91.7
Patient temperature: 93.1
TCO2: 6 mmol/L (ref 0–100)
TCO2: 10 mmol/L (ref 0–100)
TCO2: 10 mmol/L (ref 0–100)
TCO2: 8 mmol/L (ref 0–100)
pCO2 arterial: 32.5 mmHg — ABNORMAL LOW (ref 35.0–45.0)
pCO2 arterial: 28.8 mmHg — ABNORMAL LOW (ref 35.0–45.0)
pH, Arterial: 6.97 — CL (ref 7.350–7.450)
pH, Arterial: 7.003 — CL (ref 7.350–7.450)
pH, Arterial: 7 — CL (ref 7.350–7.450)
pO2, Arterial: 537 mmHg — ABNORMAL HIGH (ref 80.0–100.0)
pO2, Arterial: 267 mmHg — ABNORMAL HIGH (ref 80.0–100.0)
pO2, Arterial: 170 mmHg — ABNORMAL HIGH (ref 80.0–100.0)

## 2015-01-14 LAB — I-STAT ARTERIAL BLOOD GAS, ED
Acid-base deficit: 28 mmol/L — ABNORMAL HIGH (ref 0.0–2.0)
Bicarbonate: 5.5 mEq/L — ABNORMAL LOW (ref 20.0–24.0)
O2 Saturation: 100 %
PCO2 ART: 38.9 mmHg (ref 35.0–45.0)
TCO2: 7 mmol/L (ref 0–100)
pH, Arterial: 6.762 — CL (ref 7.350–7.450)
pO2, Arterial: 523 mmHg — ABNORMAL HIGH (ref 80.0–100.0)

## 2015-01-14 LAB — CBC
HEMATOCRIT: 27 % — AB (ref 36.0–46.0)
HEMOGLOBIN: 8.4 g/dL — AB (ref 12.0–15.0)
MCH: 29.8 pg (ref 26.0–34.0)
MCHC: 31.1 g/dL (ref 30.0–36.0)
MCV: 95.7 fL (ref 78.0–100.0)
Platelets: 159 10*3/uL (ref 150–400)
RBC: 2.82 MIL/uL — ABNORMAL LOW (ref 3.87–5.11)
RDW: 15.4 % (ref 11.5–15.5)
WBC: 5.1 10*3/uL (ref 4.0–10.5)

## 2015-01-14 LAB — COMPREHENSIVE METABOLIC PANEL
ALBUMIN: 3.3 g/dL — AB (ref 3.5–5.0)
ALK PHOS: 43 U/L (ref 38–126)
ALK PHOS: 42 U/L (ref 38–126)
ALT: 18 U/L (ref 14–54)
ALT: 27 U/L (ref 14–54)
ANION GAP: 30 — AB (ref 5–15)
AST: 38 U/L (ref 15–41)
AST: 56 U/L — AB (ref 15–41)
Albumin: 3.8 g/dL (ref 3.5–5.0)
BILIRUBIN TOTAL: 0.8 mg/dL (ref 0.3–1.2)
BILIRUBIN TOTAL: 1 mg/dL (ref 0.3–1.2)
BUN: 130 mg/dL — AB (ref 6–20)
BUN: 117 mg/dL — AB (ref 6–20)
CALCIUM: 8.4 mg/dL — AB (ref 8.9–10.3)
CALCIUM: 8.5 mg/dL — AB (ref 8.9–10.3)
CO2: 6 mmol/L — ABNORMAL LOW (ref 22–32)
CO2: <5 mmol/L — ABNORMAL LOW (ref 22–32)
Chloride: 94 mmol/L — ABNORMAL LOW (ref 101–111)
Chloride: 95 mmol/L — ABNORMAL LOW (ref 101–111)
Creatinine, Ser: 7.6 mg/dL — ABNORMAL HIGH (ref 0.44–1.00)
Creatinine, Ser: 7.09 mg/dL — ABNORMAL HIGH (ref 0.44–1.00)
GFR calc Af Amer: 6 mL/min — ABNORMAL LOW (ref >60)
GFR calc Af Amer: 6 mL/min — ABNORMAL LOW (ref >60)
GFR, EST NON AFRICAN AMERICAN: 5 mL/min — AB (ref >60)
GFR, EST NON AFRICAN AMERICAN: 5 mL/min — AB (ref >60)
GLUCOSE: 262 mg/dL — AB (ref 65–99)
Glucose, Bld: 231 mg/dL — ABNORMAL HIGH (ref 65–99)
POTASSIUM: 6.6 mmol/L — AB (ref 3.5–5.1)
POTASSIUM: 6.3 mmol/L — AB (ref 3.5–5.1)
Sodium: 130 mmol/L — ABNORMAL LOW (ref 135–145)
Sodium: 131 mmol/L — ABNORMAL LOW (ref 135–145)
TOTAL PROTEIN: 6.2 g/dL — AB (ref 6.5–8.1)
TOTAL PROTEIN: 5.7 g/dL — AB (ref 6.5–8.1)

## 2015-01-14 LAB — CARBOXYHEMOGLOBIN
Carboxyhemoglobin: 1.2 % (ref 0.5–1.5)
METHEMOGLOBIN: 1.2 % (ref 0.0–1.5)
O2 Saturation: 98.6 %
TOTAL HEMOGLOBIN: 8.1 g/dL — AB (ref 12.0–16.0)

## 2015-01-14 LAB — I-STAT CHEM 8, ED
BUN: 139 mg/dL — ABNORMAL HIGH (ref 6–20)
CALCIUM ION: 1.1 mmol/L — AB (ref 1.13–1.30)
CREATININE: 7.6 mg/dL — AB (ref 0.44–1.00)
Chloride: 97 mmol/L — ABNORMAL LOW (ref 101–111)
GLUCOSE: 232 mg/dL — AB (ref 65–99)
HCT: 29 % — ABNORMAL LOW (ref 36.0–46.0)
HEMOGLOBIN: 9.9 g/dL — AB (ref 12.0–15.0)
POTASSIUM: 6.5 mmol/L — AB (ref 3.5–5.1)
SODIUM: 127 mmol/L — AB (ref 135–145)
TCO2: 6 mmol/L (ref 0–100)

## 2015-01-14 LAB — I-STAT CG4 LACTIC ACID, ED: LACTIC ACID, VENOUS: 14.45 mmol/L — AB (ref 0.5–2.0)

## 2015-01-14 LAB — PROTIME-INR
INR: 1.92 — AB (ref 0.00–1.49)
Prothrombin Time: 21.9 seconds — ABNORMAL HIGH (ref 11.6–15.2)

## 2015-01-14 LAB — FIBRINOGEN: Fibrinogen: 237 mg/dL (ref 204–475)

## 2015-01-14 LAB — LACTIC ACID, PLASMA
LACTIC ACID, VENOUS: 14.6 mmol/L — AB (ref 0.5–2.0)
Lactic Acid, Venous: 16.5 mmol/L (ref 0.5–2.0)

## 2015-01-14 LAB — I-STAT TROPONIN, ED: Troponin i, poc: 0.02 ng/mL (ref 0.00–0.08)

## 2015-01-14 LAB — TYPE AND SCREEN
ABO/RH(D): A POS
Antibody Screen: NEGATIVE

## 2015-01-14 LAB — MRSA PCR SCREENING: MRSA by PCR: NEGATIVE

## 2015-01-14 LAB — BRAIN NATRIURETIC PEPTIDE: B NATRIURETIC PEPTIDE 5: 2160.6 pg/mL — AB (ref 0.0–100.0)

## 2015-01-14 LAB — APTT: aPTT: 35 seconds (ref 24–37)

## 2015-01-14 LAB — GLUCOSE, CAPILLARY: GLUCOSE-CAPILLARY: 255 mg/dL — AB (ref 65–99)

## 2015-01-14 LAB — CORTISOL: Cortisol, Plasma: 27.8 ug/dL

## 2015-01-14 LAB — TROPONIN I

## 2015-01-14 MED ORDER — SODIUM BICARBONATE 8.4 % IV SOLN
INTRAVENOUS | Status: DC
  Administered 2015-01-14 – 2015-01-15 (×3): via INTRAVENOUS
  Filled 2015-01-14 (×5): qty 150

## 2015-01-14 MED ORDER — CHLORHEXIDINE GLUCONATE 0.12% ORAL RINSE (MEDLINE KIT)
15.0000 mL | Freq: Two times a day (BID) | OROMUCOSAL | Status: DC
  Administered 2015-01-14: 15 mL via OROMUCOSAL

## 2015-01-14 MED ORDER — SODIUM BICARBONATE 8.4 % IV SOLN
INTRAVENOUS | Status: AC
  Filled 2015-01-14: qty 200

## 2015-01-14 MED ORDER — HEPARIN SODIUM (PORCINE) 1000 UNIT/ML DIALYSIS
1000.0000 [IU] | INTRAMUSCULAR | Status: DC | PRN
  Filled 2015-01-14: qty 6

## 2015-01-14 MED ORDER — PIPERACILLIN-TAZOBACTAM IN DEX 2-0.25 GM/50ML IV SOLN
2.2500 g | Freq: Three times a day (TID) | INTRAVENOUS | Status: DC
Start: 2015-01-15 — End: 2015-01-14
  Filled 2015-01-14: qty 50

## 2015-01-14 MED ORDER — PIPERACILLIN-TAZOBACTAM IN DEX 2-0.25 GM/50ML IV SOLN
2.2500 g | Freq: Four times a day (QID) | INTRAVENOUS | Status: DC
  Administered 2015-01-14 – 2015-01-17 (×10): 2.25 g via INTRAVENOUS
  Filled 2015-01-14 (×12): qty 50

## 2015-01-14 MED ORDER — FENTANYL CITRATE (PF) 100 MCG/2ML IJ SOLN: 25.0000 ug | INTRAMUSCULAR | Status: DC | PRN

## 2015-01-14 MED ORDER — FENTANYL CITRATE (PF) 100 MCG/2ML IJ SOLN: 100.0000 ug | INTRAMUSCULAR | Status: DC | PRN

## 2015-01-14 MED ORDER — CALCIUM CHLORIDE 10 % IV SOLN
INTRAVENOUS | Status: AC
  Filled 2015-01-14: qty 10

## 2015-01-14 MED ORDER — SODIUM CHLORIDE 0.9 % IV BOLUS (SEPSIS)
500.0000 mL | Freq: Once | INTRAVENOUS | Status: AC
  Administered 2015-01-14: 500 mL via INTRAVENOUS

## 2015-01-14 MED ORDER — MIDAZOLAM HCL 2 MG/2ML IJ SOLN
1.0000 mg | INTRAMUSCULAR | Status: DC | PRN
  Administered 2015-01-14: 1 mg via INTRAVENOUS
  Administered 2015-01-15: 2 mg via INTRAVENOUS
  Filled 2015-01-14 (×3): qty 2

## 2015-01-14 MED ORDER — ROCURONIUM BROMIDE 50 MG/5ML IV SOLN
INTRAVENOUS | Status: AC
  Filled 2015-01-14: qty 2

## 2015-01-14 MED ORDER — PANTOPRAZOLE SODIUM 40 MG IV SOLR
40.0000 mg | INTRAVENOUS | Status: DC
  Administered 2015-01-14 – 2015-01-20 (×7): 40 mg via INTRAVENOUS
  Filled 2015-01-14 (×7): qty 40

## 2015-01-14 MED ORDER — SODIUM CHLORIDE 0.9 % IV BOLUS (SEPSIS)
1000.0000 mL | Freq: Once | INTRAVENOUS | Status: AC
  Administered 2015-01-14: 1000 mL via INTRAVENOUS

## 2015-01-14 MED ORDER — MIDAZOLAM HCL 2 MG/2ML IJ SOLN
INTRAMUSCULAR | Status: AC
  Administered 2015-01-14: 2 mg
  Filled 2015-01-14: qty 2

## 2015-01-14 MED ORDER — ETOMIDATE 2 MG/ML IV SOLN
INTRAVENOUS | Status: DC | PRN
  Administered 2015-01-14: 10 mg via INTRAVENOUS

## 2015-01-14 MED ORDER — SODIUM CHLORIDE 0.9 % IV BOLUS (SEPSIS)
500.0000 mL | INTRAVENOUS | Status: AC
  Administered 2015-01-14: 500 mL via INTRAVENOUS

## 2015-01-14 MED ORDER — SODIUM BICARBONATE 8.4 % IV SOLN
INTRAVENOUS | Status: AC
  Filled 2015-01-14: qty 50

## 2015-01-14 MED ORDER — DEXTROSE 5 % IV SOLN
5.0000 ug/min | INTRAVENOUS | Status: DC
  Administered 2015-01-14: 20 ug/min via INTRAVENOUS
  Administered 2015-01-14 – 2015-01-15 (×2): 10 ug/min via INTRAVENOUS
  Administered 2015-01-15: 19 ug/min via INTRAVENOUS
  Filled 2015-01-14 (×4): qty 4

## 2015-01-14 MED ORDER — PIPERACILLIN-TAZOBACTAM 3.375 G IVPB 30 MIN
3.3750 g | Freq: Once | INTRAVENOUS | Status: AC
  Administered 2015-01-14: 3.375 g via INTRAVENOUS
  Filled 2015-01-14: qty 50

## 2015-01-14 MED ORDER — SODIUM CHLORIDE 0.9 % IV SOLN: INTRAVENOUS | Status: DC

## 2015-01-14 MED ORDER — PIPERACILLIN-TAZOBACTAM IN DEX 2-0.25 GM/50ML IV SOLN
2.2500 g | Freq: Once | INTRAVENOUS | Status: DC
  Filled 2015-01-14: qty 50

## 2015-01-14 MED ORDER — SODIUM CHLORIDE 0.9 % IV SOLN
0.0300 [IU]/min | INTRAVENOUS | Status: DC
  Administered 2015-01-14: 0.03 [IU]/min via INTRAVENOUS
  Filled 2015-01-14: qty 2

## 2015-01-14 MED ORDER — ETOMIDATE 2 MG/ML IV SOLN
INTRAVENOUS | Status: AC
  Filled 2015-01-14: qty 20

## 2015-01-14 MED ORDER — PRISMASOL BGK 4/2.5 32-4-2.5 MEQ/L IV SOLN
INTRAVENOUS | Status: DC
Start: 2015-01-14 — End: 2015-01-16
  Administered 2015-01-14 – 2015-01-16 (×10): via INTRAVENOUS_CENTRAL
  Filled 2015-01-14 (×16): qty 5000

## 2015-01-14 MED ORDER — FENTANYL CITRATE (PF) 100 MCG/2ML IJ SOLN
INTRAMUSCULAR | Status: AC
  Administered 2015-01-14: 100 ug
  Filled 2015-01-14: qty 2

## 2015-01-14 MED ORDER — ETOMIDATE 2 MG/ML IV SOLN: 0.2000 mg/kg | Freq: Once | INTRAVENOUS | Status: AC

## 2015-01-14 MED ORDER — ROCURONIUM BROMIDE 50 MG/5ML IV SOLN
1.0000 mg/kg | Freq: Once | INTRAVENOUS | Status: AC
  Filled 2015-01-14: qty 5.08

## 2015-01-14 MED ORDER — SODIUM POLYSTYRENE SULFONATE 15 GM/60ML PO SUSP
60.0000 g | Freq: Once | ORAL | Status: AC
  Administered 2015-01-14: 60 g via ORAL
  Filled 2015-01-14: qty 240

## 2015-01-14 MED ORDER — SODIUM BICARBONATE 8.4 % IV SOLN
INTRAVENOUS | Status: DC | PRN
  Administered 2015-01-14: 50 meq via INTRAVENOUS

## 2015-01-14 MED ORDER — SUCCINYLCHOLINE CHLORIDE 20 MG/ML IJ SOLN
INTRAMUSCULAR | Status: AC
Start: 2015-01-14 — End: 2015-01-15
  Filled 2015-01-14: qty 1

## 2015-01-14 MED ORDER — SODIUM CHLORIDE 0.9 % IV SOLN
1.0000 g | Freq: Once | INTRAVENOUS | Status: AC
  Filled 2015-01-14: qty 10

## 2015-01-14 MED ORDER — PRISMASOL BGK 4/2.5 32-4-2.5 MEQ/L IV SOLN
INTRAVENOUS | Status: DC
  Administered 2015-01-14 – 2015-01-16 (×10): via INTRAVENOUS_CENTRAL
  Filled 2015-01-14 (×16): qty 5000

## 2015-01-14 MED ORDER — VANCOMYCIN HCL IN DEXTROSE 1-5 GM/200ML-% IV SOLN
1000.0000 mg | Freq: Once | INTRAVENOUS | Status: AC
  Administered 2015-01-14: 1000 mg via INTRAVENOUS
  Filled 2015-01-14: qty 200

## 2015-01-14 MED ORDER — PRISMASOL BGK 4/2.5 32-4-2.5 MEQ/L IV SOLN
INTRAVENOUS | Status: DC
  Administered 2015-01-14 – 2015-01-16 (×11): via INTRAVENOUS_CENTRAL
  Filled 2015-01-14 (×20): qty 5000

## 2015-01-14 MED ORDER — SODIUM BICARBONATE 8.4 % IV SOLN
50.0000 meq | Freq: Once | INTRAVENOUS | Status: AC
  Administered 2015-01-14: 50 meq via INTRAVENOUS
  Filled 2015-01-14: qty 50

## 2015-01-14 MED ORDER — SODIUM BICARBONATE 8.4 % IV SOLN
50.0000 meq | Freq: Once | INTRAVENOUS | Status: AC
  Filled 2015-01-14: qty 50

## 2015-01-14 MED ORDER — SODIUM BICARBONATE 8.4 % IV SOLN
200.0000 meq | Freq: Once | INTRAVENOUS | Status: AC
  Administered 2015-01-14: 200 meq via INTRAVENOUS

## 2015-01-14 MED ORDER — ROCURONIUM BROMIDE 50 MG/5ML IV SOLN
INTRAVENOUS | Status: DC | PRN
  Administered 2015-01-14: 50 mg via INTRAVENOUS

## 2015-01-14 MED ORDER — CALCIUM CHLORIDE 10 % IV SOLN
INTRAVENOUS | Status: DC | PRN
  Administered 2015-01-14: 10 g via INTRAVENOUS

## 2015-01-14 MED ORDER — LIDOCAINE HCL (CARDIAC) 20 MG/ML IV SOLN
INTRAVENOUS | Status: AC
Start: 2015-01-14 — End: 2015-01-15
  Filled 2015-01-14: qty 5

## 2015-01-14 MED ORDER — HYDROCORTISONE NA SUCCINATE PF 100 MG IJ SOLR
50.0000 mg | Freq: Four times a day (QID) | INTRAMUSCULAR | Status: DC
  Administered 2015-01-14 – 2015-01-15 (×2): 50 mg via INTRAVENOUS
  Filled 2015-01-14: qty 1
  Filled 2015-01-14: qty 2
  Filled 2015-01-14 (×2): qty 1
  Filled 2015-01-14: qty 2
  Filled 2015-01-14: qty 1

## 2015-01-14 MED ORDER — SODIUM CHLORIDE 0.9 % FOR CRRT
INTRAVENOUS_CENTRAL | Status: DC | PRN
  Filled 2015-01-14: qty 1000

## 2015-01-14 MED ORDER — SODIUM CHLORIDE 0.9 % IV SOLN
20.0000 ug | Freq: Once | INTRAVENOUS | Status: DC
  Filled 2015-01-14: qty 5

## 2015-01-14 MED ORDER — ANTISEPTIC ORAL RINSE SOLUTION (CORINZ)
7.0000 mL | Freq: Four times a day (QID) | OROMUCOSAL | Status: DC
  Administered 2015-01-14 – 2015-01-15 (×2): 7 mL via OROMUCOSAL

## 2015-01-14 NOTE — ED Provider Notes (Signed)
CSN: [id number]     Arrival date & time 01/14/15  1425 History   None    Chief Complaint  Patient presents with  . Fatigue   Patient is a 64 y.o. female presenting with general illness. The history is provided by the EMS personnel, a relative, medical records and the patient. The history is limited by the condition of the patient. No language interpreter was used.  Illness Location:  NA Quality:  AMS Severity:  Severe Onset quality:  Gradual Timing:  Constant Progression:  Worsening Chronicity:  Recurrent Context:  PMHx of HTN, DM, CAD, COPD presenting via EMS with AMS. Per family patient has been experiencing gradual decline in kidney function with recent creatinine of 3.2. Patient seen by nephrology for renal biopsy 2 days ago. Patient with progressive weakness and shortness of breath since then. Seen by PCP yesterday and given prednisone and [place] for presumed COPD exacerbation. Patient with worsening weakness and generalized fatigue today and brought to ED by family members for this reason.   Past Medical History  Diagnosis Date  . PONV (postoperative nausea and vomiting)   . Hypertension   . COPD (chronic obstructive pulmonary disease)   . Diabetes mellitus without complication 10-29-12  . Coronary artery disease    Past Surgical History  Procedure Laterality Date  . Abdominal hysterectomy    . Elbow surgery Right     tendon surgery  . Tubal ligation    . Breast surgery      cyst removed  . Cholecystectomy      '74-open  . Appendectomy      '74- open with gallbladder  . Cardiac catheterization      1 coronary stent placed  . Ganglion cyst excision Bilateral 10-29-12    wrist  . Anal fissure repair    . Colonoscopy with propofol N/A 11/17/2012    Procedure: COLONOSCOPY WITH PROPOFOL;  Surgeon: Charolett Bumpers, MD;  Location: [facility];  Service: Endoscopy;  Laterality: N/A;  . Esophagogastroduodenoscopy (egd) with propofol N/A 11/17/2012    Procedure:  ESOPHAGOGASTRODUODENOSCOPY (EGD) WITH PROPOFOL;  Surgeon: Charolett Bumpers, MD;  Location: [facility];  Service: Endoscopy;  Laterality: N/A;  . Left heart catheterization with coronary angiogram N/A 04/14/2014    Procedure: LEFT HEART CATHETERIZATION WITH CORONARY ANGIOGRAM;  Surgeon: Lesleigh Noe, MD;  Location: [facility] CATH LAB;  Service: Cardiovascular;  Laterality: N/A;   Family History  Problem Relation Age of Onset  . Diabetes Father   . Cancer Father   . Heart attack Mother   . Hypertension Mother   . Hypertension Father   . Diabetes Sister   . Hypertension Sister   . Cancer Sister   . Hypertension Sister    Social History  Substance Use Topics  . Smoking status: Former Smoker -- 1.50 packs/day    Types: Cigarettes    Quit date: 10/29/1992  . Smokeless tobacco: None  . Alcohol Use: No   OB History    No data available      Review of Systems  Unable to perform ROS: Acuity of condition    Allergies  Review of patient's allergies indicates no known allergies.  Home Medications   Prior to Admission medications   Medication Sig Start Date End Date Taking? Authorizing Provider  amoxicillin-clavulanate (AUGMENTIN) 500-125 MG per tablet Take 1 tablet by mouth 2 (two) times daily. 01/13/15  Yes Historical Provider, MD  predniSONE (STERAPRED UNI-PAK 21 TAB) 10 MG (21) TBPK tablet as directed.  6 day dose pack 01/13/15  Yes Historical Provider, MD  ALPRAZolam (XANAX) 0.5 MG tablet Take 0.5 mg by mouth 2 (two) times daily.    Historical Provider, MD  amLODipine (NORVASC) 10 MG tablet Take 10 mg by mouth daily. 09/02/14   Historical Provider, MD  aspirin EC 81 MG tablet Take 81 mg by mouth daily.    Historical Provider, MD  atorvastatin (LIPITOR) 10 MG tablet Take 10 mg by mouth every morning.    Historical Provider, MD  carvedilol (COREG) 25 MG tablet Take 1 tablet (25 mg total) by mouth 3 (three) times daily. 04/20/14   Lyn Records, MD  furosemide (LASIX) 40 MG tablet Take 1  tablet by mouth  daily Patient taking differently: Take 2 tablet by mouth  daily 07/19/14   Lyn Records, MD  glimepiride (AMARYL) 2 MG tablet Take 2 mg by mouth daily. 09/02/14   Historical Provider, MD  iron polysaccharides (NIFEREX) 150 MG capsule Take 1 capsule (150 mg total) by mouth daily. 03/28/14   Rhetta Mura, MD  isosorbide dinitrate (ISORDIL) 20 MG tablet Take 1 tablet (20 mg total) by mouth 3 (three) times daily. 07/29/14   Lyn Records, MD  latanoprost (XALATAN) 0.005 % ophthalmic solution Place 1 drop into both eyes at bedtime.    Historical Provider, MD  metFORMIN (GLUCOPHAGE) 500 MG tablet Take 500-1,000 mg by mouth 3 (three) times daily. Takes 1000 mg in the morning, 500 mg at lunch, and 1000 mg at night    Historical Provider, MD  nitroGLYCERIN (NITROSTAT) 0.4 MG SL tablet Place 0.4 mg under the tongue every 5 (five) minutes as needed for chest pain.    Historical Provider, MD  salmeterol (SEREVENT) 50 MCG/DOSE diskus inhaler Inhale 1 puff into the lungs 2 (two) times daily.    Historical Provider, MD   BP 108/43 mmHg  Pulse 78  Temp(Src) 95.2 F (35.1 C) (Core (Comment))  Resp 32  Wt 112 lb (50.803 kg)  SpO2 100%   Physical Exam  Constitutional:  elderly frail female lying in stretcher. Lethargic.  HENT:  Head: Normocephalic and atraumatic.  Eyes: Conjunctivae are normal. Pupils are equal, round, and reactive to light.  Neck: Neck supple. No tracheal deviation present.  Cardiovascular: Intact distal pulses.   Bradycardic to 50s, hypertensive with SBP's in 80s  Pulmonary/Chest: She is in respiratory distress. She has no wheezes.  Tachypnea, increased work of breathing, maintaining oxygen saturations on room air  Abdominal: Soft. She exhibits no distension.  Musculoskeletal: Normal range of motion.  Neurological:  Patient lethargic, will open eyes to command, following simple commands, alert and oriented 1  Skin: She is not diaphoretic.  Nursing note and  vitals reviewed.   ED Course  INTUBATION Date/Time: 01/14/2015 3:30 PM Performed by: Angelina Ok Authorized by: Angelina Ok Consent: The procedure was performed in an emergent situation. Risks and benefits: risks, benefits and alternatives were discussed Consent given by: patient Test results: test results available and properly labeled Required items: required blood products, implants, devices, and special equipment available Patient identity confirmed: arm band and hospital-assigned identification number Time out: Immediately prior to procedure a "time out" was called to verify the correct patient, procedure, equipment, support staff and site/side marked as required. Indications: airway protection,  hypoxemia and  respiratory distress Intubation method: direct Patient status: paralyzed (RSI) Preoxygenation: nonrebreather mask Sedatives: etomidate Paralytic: rocuronium Laryngoscope size: D blade from glidescope. Tube size: 7.5 mm Tube type: cuffed Number of attempts: 1  Ventilation between attempts: NA. Cricoid pressure: no Cords visualized: yes Post-procedure assessment: chest rise,  ETCO2 monitor and CO2 detector Breath sounds: equal Cuff inflated: yes ETT to lip: 23 cm ETT to teeth: 22 cm Tube secured with: ETT holder Chest x-ray interpreted by me. Chest x-ray findings: endotracheal tube in appropriate position Patient tolerance: Patient tolerated the procedure well with no immediate complications   Labs Review Labs Reviewed  COMPREHENSIVE METABOLIC PANEL - Abnormal; Notable for the following:    Sodium 130 (*)    Potassium 6.6 (*)    Chloride 94 (*)    CO2 6 (*)    Glucose, Bld 231 (*)    BUN 130 (*)    Creatinine, Ser 7.60 (*)    Calcium 8.4 (*)    Total Protein 6.2 (*)    GFR calc non Af Amer 5 (*)    GFR calc Af Amer 6 (*)    Anion gap 30 (*)    All other components within normal limits  CBC WITH DIFFERENTIAL/PLATELET - Abnormal; Notable for the  following:    RBC 2.84 (*)    Hemoglobin 8.7 (*)    HCT 27.5 (*)    Platelets 142 (*)    Neutrophils Relative % 89 (*)    Lymphocytes Relative 7 (*)    Lymphs Abs 0.3 (*)    All other components within normal limits  BRAIN NATRIURETIC PEPTIDE - Abnormal; Notable for the following:    B Natriuretic Peptide 2160.6 (*)    All other components within normal limits  CBC - Abnormal; Notable for the following:    RBC 2.82 (*)    Hemoglobin 8.4 (*)    HCT 27.0 (*)    All other components within normal limits  COMPREHENSIVE METABOLIC PANEL - Abnormal; Notable for the following:    Sodium 131 (*)    Potassium 6.3 (*)    Chloride 95 (*)    CO2 <5 (*)    Glucose, Bld 262 (*)    BUN 117 (*)    Creatinine, Ser 7.09 (*)    Calcium 8.5 (*)    Total Protein 5.7 (*)    Albumin 3.3 (*)    AST 56 (*)    GFR calc non Af Amer 5 (*)    GFR calc Af Amer 6 (*)    All other components within normal limits  LACTIC ACID, PLASMA - Abnormal; Notable for the following:    Lactic Acid, Venous 14.6 (*)    All other components within normal limits  LACTIC ACID, PLASMA - Abnormal; Notable for the following:    Lactic Acid, Venous 16.5 (*)    All other components within normal limits  PROTIME-INR - Abnormal; Notable for the following:    Prothrombin Time 21.9 (*)    INR 1.92 (*)    All other components within normal limits  GLUCOSE, CAPILLARY - Abnormal; Notable for the following:    Glucose-Capillary 255 (*)    All other components within normal limits  CARBOXYHEMOGLOBIN - Abnormal; Notable for the following:    Total hemoglobin 8.1 (*)    All other components within normal limits  I-STAT CG4 LACTIC ACID, ED - Abnormal; Notable for the following:    Lactic Acid, Venous 14.45 (*)    All other components within normal limits  I-STAT CHEM 8, ED - Abnormal; Notable for the following:    Sodium 127 (*)    Potassium 6.5 (*)    Chloride 97 (*)  BUN 139 (*)    Creatinine, Ser 7.60 (*)    Glucose, Bld  232 (*)    Calcium, Ion 1.10 (*)    Hemoglobin 9.9 (*)    HCT 29.0 (*)    All other components within normal limits  I-STAT ARTERIAL BLOOD GAS, ED - Abnormal; Notable for the following:    pH, Arterial 6.762 (*)    pO2, Arterial 523.0 (*)    Bicarbonate 5.5 (*)    Acid-base deficit 28.0 (*)    All other components within normal limits  POCT I-STAT 3, ART BLOOD GAS (G3+) - Abnormal; Notable for the following:    pH, Arterial 6.811 (*)    pCO2 arterial 30.0 (*)    pO2, Arterial 537.0 (*)    Bicarbonate 5.2 (*)    Acid-base deficit 29.0 (*)    All other components within normal limits  POCT I-STAT 3, ART BLOOD GAS (G3+) - Abnormal; Notable for the following:    pH, Arterial 6.970 (*)    pO2, Arterial 421.0 (*)    Bicarbonate 8.7 (*)    Acid-base deficit 22.0 (*)    All other components within normal limits  POCT I-STAT 3, ART BLOOD GAS (G3+) - Abnormal; Notable for the following:    pH, Arterial 7.003 (*)    pCO2 arterial 32.5 (*)    pO2, Arterial 267.0 (*)    Bicarbonate 8.5 (*)    Acid-base deficit 22.0 (*)    All other components within normal limits  POCT I-STAT 3, ART BLOOD GAS (G3+) - Abnormal; Notable for the following:    pH, Arterial 7.000 (*)    pCO2 arterial 28.8 (*)    pO2, Arterial 170.0 (*)    Bicarbonate 7.5 (*)    Acid-base deficit 23.0 (*)    All other components within normal limits  MRSA PCR SCREENING  CULTURE, BLOOD (ROUTINE X 2)  CULTURE, BLOOD (ROUTINE X 2)  URINE CULTURE  URINE CULTURE  CULTURE, RESPIRATORY (NON-EXPECTORATED)  CORTISOL  TROPONIN I  APTT  FIBRINOGEN  URINALYSIS, ROUTINE W REFLEX MICROSCOPIC (NOT AT [facility])  URINALYSIS, ROUTINE W REFLEX MICROSCOPIC (NOT AT [facility])  CBC  BASIC METABOLIC PANEL  MAGNESIUM  PHOSPHORUS  BLOOD GAS, ARTERIAL  RENAL FUNCTION PANEL  I-STAT TROPOININ, ED  I-STAT CG4 LACTIC ACID, ED  TYPE AND SCREEN   Imaging Review US Renal  01/14/2015   CLINICAL DATA:  Renal failure. History of hypertension, COPD,  diabetes.  EXAM: RENAL / URINARY TRACT ULTRASOUND COMPLETE  COMPARISON:  10/21/2014  FINDINGS: Right Kidney:  Length: 9.9 cm. Renal parenchyma is echogenic. Multiple small cysts are identified. The largest is seen in the lower pole region measuring 1.1 cm. No hydronephrosis.  Left Kidney:  Length: 10.2 cm. Echogenic renal parenchyma. Multiple small cysts are identified, largest in the lower pole region which measures 1.3 cm.  Bladder:  A Foley catheter decompresses the bladder.  Additional:  Note is made of a small amount of ascites.  IMPRESSION: 1. Echogenic kidneys bilaterally.  Bilateral renal cysts. 2. No hydronephrosis. 3. Small amount of ascites noted.   Electronically Signed   By: Norva Pavlov M.D.   On: 01/14/2015 17:44   Dg Chest Port 1 View  01/14/2015   CLINICAL DATA:  Septic shock, respiratory failure and acute renal failure. Endotracheal tube and central line placement.  EXAM: PORTABLE CHEST - 1 VIEW  COMPARISON:  01/14/2015 and prior radiographs  FINDINGS: The cardiomediastinal silhouette is unchanged.  An endotracheal tube with tip  5.1 cm above the carina, left IJ central venous catheter with tip overlying the lower SVC and NG tube entering the stomach with tip off the field of view are noted.  Left lower lung airspace disease is present.  There is no evidence of pneumothorax or pleural effusion.  IMPRESSION: Left lower lung airspace disease/ pneumonia.  Support apparatus as described.  No evidence of pneumothorax.   Electronically Signed   By: Harmon Pier M.D.   On: 01/14/2015 16:38   Dg Chest Portable 1 View  01/14/2015   CLINICAL DATA:  Sepsis.  EXAM: PORTABLE CHEST - 1 VIEW  COMPARISON:  03/25/2014.  FINDINGS: Interval borderline enlargement of the cardiac silhouette and mild increase in prominence of the interstitial markings, accentuated by a decreased inspiration. Calcified granuloma in the right lower lung zone. No pleural fluid. Diffuse osteopenia.  IMPRESSION: Interval borderline  cardiomegaly and mildly increased prominence of the interstitial markings, most likely due to a poor inspiration. No gross acute abnormality.   Electronically Signed   By: Beckie Salts M.D.   On: 01/14/2015 15:21   I have personally reviewed and evaluated these images and lab results as part of my medical decision-making.   EKG Interpretation   Date/Time:  Saturday January 14 2015 14:33:10 EDT Ventricular Rate:  52 PR Interval:  162 QRS Duration: 113 QT Interval:  549 QTC Calculation: 511 R Axis:   94 Text Interpretation:  Sinus rhythm Borderline intraventricular conduction  delay Borderline low voltage, extremity leads Prolonged QT interval now  with prolonged QT and IVCD and bradycardia Confirmed by [facility]  MD, TREY  ([id number]) on 01/14/2015 5:29:23 PM      MDM  Ms. Schwalb is a 64 yo female with PMHx of HTN, DM, CAD, COPD presenting via EMS with AMS. Per family patient has been experiencing gradual decline in kidney function with recent creatinine of 3.2. Patient seen by nephrology for renal biopsy 2 days ago. Patient with progressive weakness and shortness of breath since then. Seen by PCP yesterday and given prednisone and abx for presumed COPD exacerbation. Patient with worsening weakness and generalized fatigue today and brought to ED by family members for this reason.  Exam above notable for elderly female lying in stretcher. Lethargic. Hypothermic to 90.70F blankets and bear hugger placed. Warm IV fluids started. Hypotensive with SBP 70s to 80s. Bradycardic to 50s. Tachypneic. Maintaining oxygen saturations on room air. Patient lethargic, will open eyes to command, following simple commands, alert and oriented 1  Given altered mental status, hypothermia, and hypotension there is concern for severe sepsis. Blood cultures drawn. Vancomycin and Zosyn ordered. Warmed IV fluids started as noted above. Lactic acid 14. Creatinine 7. BUN 139. Bicarbonate 6. Given waning mental status as well  as CHF/CKD in the setting of severe sepsis necessitating aggressive fluid resuscitation, the decision was made to intubate the patient - please refer to above procedure note for further details. Critical care was consulted and was present at bedside.  Patient admitted to ICU for further evaluation and management of severe sepsis. Patient's family understands and agrees with plan and has no further questions or concerns at this time.  Patient care discussed with and followed by my attending Dr. Blake Divine.  Final diagnoses:  History of ETT  Renal failure  Encephalopathy    Angelina Ok, MD 01/14/15 [id number]  Blake Divine, MD 01/15/15 [phone]

## 2015-01-14 NOTE — ED Notes (Signed)
Dr. Adela Lank notified of elevated K and AnGap on Chem-8

## 2015-01-14 NOTE — Procedures (Signed)
Arterial Catheter Insertion Procedure Note Pamela [name] [id number] 1950-07-31  Procedure: Insertion of Arterial Catheter  Indications: Blood pressure monitoring and Frequent blood sampling  Procedure Details Consent: Unable to obtain consent because of emergent medical necessity. Time Out: Verified patient identification, verified procedure, site/side was marked, verified correct patient position, special equipment/implants available, medications/allergies/relevent history reviewed, required imaging and test results available.  Performed  Maximum sterile technique was used including cap, gloves, gown, hand hygiene, mask and sheet. Skin prep: Chlorhexidine; local anesthetic administered 20 gauge catheter was inserted into left radial artery using the Seldinger technique.  Evaluation Blood flow good; BP tracing good. Complications: No apparent complications.  Left Radial arterial catheter inserted under sterile technique with no complications. Catheter secured and attached to monitor for assessment. RT will continue to monitor.    Pamela [name] 01/14/2015

## 2015-01-14 NOTE — Progress Notes (Signed)
eLink Physician-Brief Progress Note Patient Name: Pamela [name] DOB: [birth year] MRN: [id number]   Date of Service  01/14/2015  HPI/Events of Note  Patient requires DVT and Stress Ulcer Prophylaxis.   eICU Interventions  Will order: 1. Protonix IV. 2. SCD's.     Intervention Category Intermediate Interventions: Best-practice therapies (e.g. DVT, beta blocker, etc.)  Sommer,Steven Eugene 01/14/2015, 6:24 PM

## 2015-01-14 NOTE — ED Notes (Signed)
Pt. Has Stage 3 kidney failure and hx of COPD, CHF and diabetes. She had a lt kidney biopsy on Thursday 01/12/2015 and has been having generalized weakness since the procedure.  Last night the symptoms have gotten worse.  She is having generalized weakness and fatique.  Last BP 102/60. CBG 300, SR 60   GCS 15  Wheezing in bilateral lobes, received an albuterol treatment en route.  Pt. Also has bilateral lower extremity edema.

## 2015-01-14 NOTE — ED Notes (Signed)
Critical ABG results called to Dr. Molli Knock at [phone].

## 2015-01-14 NOTE — Progress Notes (Signed)
Numerous ABG results  PH= 6.97, 7.003 and 7.00 and Lactic acids of 14.6 and 16. 3  given to Dr Jamison Neighbor on the unit with patient

## 2015-01-14 NOTE — Progress Notes (Addendum)
eLink Physician-Brief Progress Note Patient Name: Pamela [name] DOB: 1950/08/17 MRN: [id number]   Date of Service  01/14/2015  HPI/Events of Note  ABG = 6.81/30/537.0  eICU Interventions  Will order: 1. Increase NaHCO3 IV infusion to 150 mL/hour. 2. NaHCO3 200 meq IV now.  3. Repeat ABG at 7:45 PM.      Intervention Category Major Interventions: Acid-Base disturbance - evaluation and management  Lenell Antu 01/14/2015, 6:38 PM

## 2015-01-14 NOTE — ED Notes (Signed)
Resident at bedside.  

## 2015-01-14 NOTE — ED Provider Notes (Signed)
I saw and evaluated the patient, reviewed the resident's note and I agree with the findings and plan.   EKG Interpretation None      CRITICAL CARE Performed by: Merrie Roof   Total critical care time: 63  Critical care time was exclusive of separately billable procedures and treating other patients.  Critical care was necessary to treat or prevent imminent or life-threatening deterioration.  Critical care was time spent personally by me on the following activities: development of treatment plan with patient and/or surrogate as well as nursing, discussions with consultants, evaluation of patient's response to treatment, examination of patient, obtaining history from patient or surrogate, ordering and performing treatments and interventions, ordering and review of laboratory studies, ordering and review of radiographic studies, pulse oximetry and re-evaluation of patient's condition.   64 year old female with history of chronic kidney disease and CHF who presents with weakness, confusion after having a renal biopsy done a few days ago. According to her husband, she began to feel weak today and it progressed rather rapidly. She said she mutilated, 4 hours prior to arrival. Shortly thereafter, she became confused and was too weak to get out of bed.  On exam, ill, toxic appearance, lethargic, disoriented to place, time, tachypnea With shallow respirations, bradycardic with distant heart sounds, abdomen soft without tenderness, left renal biopsy site clean without palpable or visible hematomas, 1+ bilateral lower shortly patient edema.  She was also found to be hyper thermic and hypotensive. Code sepsis initiated and she was treated empirically with IV fluids and broad-spectrum IV antibiotics. Her lactic acid was extremely elevated and she had acute worsening of her renal function and hyperkalemia. She was given IV calcium, IV bicarbonate. She was intubated by Dr. Joaquim Lai. I was present  and supervised the entire procedure. Critical care was consulted and plans to take her to the ICU.  Clinical Impression: 1. History of ETT   2. Renal failure   3. Encephalopathy   hypothermia Sepsis Acute on chronic renal failure Shock    Blake Divine, MD 01/15/15 [phone]

## 2015-01-14 NOTE — Progress Notes (Signed)
PCCM Attending Note: Notified persistent acidosis with LA elevated at 14.6. Suboptimal minute ventilation. Switching to PRVC w/ RR 35 & TV 440cc. Giving 1amp bicarb & repeat ABG in 20 minutes. May require CVVHD. Patient's sister & husband updated at beside regarding critical status & possible need for CVVHD.  Donna Christen Jamison Neighbor, M.D.  Pulmonary & Critical Care Pager:  [phone] After 3pm or if no response, call [phone]

## 2015-01-14 NOTE — Consult Note (Signed)
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  Uneventful biopsy per chart.  Admitted with malaise, unable to get out of bed and found to be hypothermic, hypotensive with AMS, AKI, hyperkalemia, profound metabolic acidosis.  Intubated, started on broad spectrum ABX, pressors.  Foley placed no blood reportedin urine.  Given meds to stasblize K and improve acidosis.   Marland Kitchen PMH includes DM2 on metformin at least per chart, HTN, CAD   CREATININE, SER (mg/dL)  Date Value  16/02/9603 7.60*  01/14/2015 7.60*  12/06/2014 3.20*  11/08/2014 2.88*  10/11/2014 2.89*  09/12/2014 2.71*  08/15/2014 2.46*  07/12/2014 2.19*  06/13/2014 2.23*  04/21/2014 2.1*  ] I/Os:  ROS NSAIDS: unknown IV Contrast no exposure TMP/SMX no exposure Hypotension present Balance of 12 systems is negative w/ exceptions as above  PMH  Past Medical History  Diagnosis Date  . PONV (postoperative nausea and vomiting)   . Hypertension   . COPD (chronic obstructive pulmonary disease)   . Diabetes mellitus without complication 10-29-12  . Coronary artery disease    PSH  Past Surgical History  Procedure Laterality Date  . Abdominal hysterectomy    . Elbow surgery Right     tendon surgery  . Tubal ligation    . Breast surgery      cyst removed  . Cholecystectomy      '74-open  . Appendectomy      '74- open with gallbladder  . Cardiac catheterization      1 coronary stent placed  . Ganglion cyst excision Bilateral 10-29-12    wrist  . Anal fissure repair    . Colonoscopy with propofol N/A 11/17/2012    Procedure: COLONOSCOPY WITH PROPOFOL;  Surgeon: Charolett Bumpers, MD;  Location: [facility] ENDOSCOPY;  Service: Endoscopy;  Laterality: N/A;  . Esophagogastroduodenoscopy (egd) with propofol N/A 11/17/2012    Procedure:  ESOPHAGOGASTRODUODENOSCOPY (EGD) WITH PROPOFOL;  Surgeon: Charolett Bumpers, MD;  Location: [facility];  Service: Endoscopy;  Laterality: N/A;  . Left heart catheterization with coronary angiogram N/A 04/14/2014    Procedure: LEFT HEART CATHETERIZATION WITH CORONARY ANGIOGRAM;  Surgeon: Lesleigh Noe, MD;  Location: [facility] CATH LAB;  Service: Cardiovascular;  Laterality: N/A;   FH  Family History  Problem Relation Age of Onset  . Diabetes Father   . Cancer Father   . Heart attack Mother   . Hypertension Mother   . Hypertension Father   . Diabetes Sister   . Hypertension Sister   . Cancer Sister   . Hypertension Sister    SH  reports that she quit smoking about 22 years ago. Her smoking use included Cigarettes. She smoked 1.50 packs per day. She does not have any smokeless tobacco history on file. She reports that she does not drink alcohol or use illicit drugs. Allergies No Known Allergies Home medications Prior to Admission medications   Medication Sig Start Date End Date Taking? Authorizing Provider  amoxicillin-clavulanate (AUGMENTIN) 500-125 MG per tablet Take 1 tablet by mouth 2 (two) times daily. 01/13/15  Yes Historical Provider, MD  predniSONE (STERAPRED UNI-PAK 21 TAB) 10 MG (21) TBPK tablet as directed. 6 day dose pack 01/13/15  Yes Historical Provider, MD  ALPRAZolam (XANAX) 0.5 MG tablet Take 0.5 mg by mouth 2 (two) times daily.    Historical Provider, MD  amLODipine (NORVASC) 10 MG tablet Take 10 mg by mouth daily. 09/02/14   Historical Provider, MD  aspirin EC 81 MG tablet Take 81 mg by mouth daily.    Historical Provider, MD  atorvastatin (LIPITOR) 10 MG tablet Take 10 mg by mouth every morning.    Historical Provider, MD  carvedilol (COREG) 25 MG tablet Take 1 tablet (25 mg total) by mouth 3 (three) times daily. 04/20/14   Lyn Records, MD  furosemide (LASIX) 40 MG tablet Take 1 tablet by mouth  daily Patient taking differently: Take 2 tablet by mouth  daily 07/19/14   Lyn Records, MD  glimepiride (AMARYL) 2 MG tablet Take 2 mg by mouth daily. 09/02/14   Historical Provider, MD  iron polysaccharides (NIFEREX) 150 MG capsule Take 1 capsule (150 mg total) by mouth daily. 03/28/14   Rhetta Mura, MD  isosorbide dinitrate (ISORDIL) 20 MG tablet Take 1 tablet (20 mg total) by mouth 3 (three) times daily. 07/29/14   Lyn Records, MD  latanoprost (XALATAN) 0.005 % ophthalmic solution Place 1 drop into both eyes at bedtime.    Historical Provider, MD  metFORMIN (GLUCOPHAGE) 500 MG tablet Take 500-1,000 mg by mouth 3 (three) times daily. Takes 1000 mg in the morning, 500 mg at lunch, and 1000 mg at night    Historical Provider, MD  nitroGLYCERIN (NITROSTAT) 0.4 MG SL tablet Place 0.4 mg under the tongue every 5 (five) minutes as needed for chest pain.    Historical Provider, MD  salmeterol (SEREVENT) 50 MCG/DOSE diskus inhaler Inhale 1 puff into the lungs 2 (two) times daily.    Historical Provider, MD    Current Medications Scheduled Meds: . calcium chloride      . desmopressin (DDAVP) IV  20 mcg Intravenous Once  . fentaNYL      . hydrocortisone sodium succinate  50 mg Intravenous Q6H  . lidocaine (cardiac) 100 mg/35ml      . midazolam      . sodium polystyrene  60 g Oral Once  . succinylcholine       Continuous Infusions: . norepinephrine (LEVOPHED) Adult infusion 10 mcg/min (01/14/15 1641)  . [START ON 01/15/2015] piperacillin-tazobactam (ZOSYN)  IV    .  sodium bicarbonate  infusion 1000 mL    . vasopressin (PITRESSIN) infusion - *FOR SHOCK*     PRN Meds:.calcium chloride, etomidate, fentaNYL (SUBLIMAZE) injection, midazolam, rocuronium, sodium bicarbonate  CBC  Recent Labs Lab 01/12/15 0854 01/14/15 1445 01/14/15 1523  WBC 6.0 4.7  --   NEUTROABS  --  4.2  --   HGB 9.4* 8.7* 9.9*  HCT 28.8* 27.5* 29.0*  MCV 90.3 96.8  --   PLT 150 142*  --    Basic Metabolic Panel  Recent Labs Lab 01/14/15 1445 01/14/15 1523  NA 130* 127*  K 6.6* 6.5*   CL 94* 97*  CO2 6*  --   GLUCOSE 231* 232*  BUN 130* 139*  CREATININE 7.60* 7.60*  CALCIUM 8.4*  --     Physical Exam  Blood pressure 84/42, pulse 56, temperature 90.4 F (32.4 C), temperature source Rectal, resp. rate 30, weight 50.803 kg (112 lb), SpO2 100 %. GEN: intubated sedated ENT: OETT and OGT in place EYES: eyes closed CV: RRR PULM:CTAB ABD: s/ntnd SKIN: no rashes EXT:2+ edema   Assessment/Plan 27F s/p renal Bx 9/1 for idiopathic progressive CKD and proteinuria returning with hypotension, hypothermia, AoCKD, severe azotemia, hyperkalemia, inc AG metabolic acidosis win inc lactate, AMS,  VDRF 2/2 AMS,   1. AoCKD with Azotemia 1. [place] [facility], preadmit SCr in 3s 2. Renal Bx 9/1, no path yet 3. Home meds include diuretics / metformin; unclear if taken 4. Possibilities 1. Profound volume depletion / sepsis 2. ATN 3. OBstruction post biopsy 5. Renal imaging pending 6. Would cont volume resuscitation with NaHCO3 in D5W 7. q4h BMP, see if any corection with volume and base therapy, if not will need RRT offered 2. Hyperkalemia 1. S/p CaCl, Kayexalate 2. On NaHCO3 3. FOllow closely 4. EKG w/o peaked Ts, 3. Inc AG Metabolic Acidosis with inc lactate 1. Likely 2/2 hypotension + renal failure 2. Meds listed include metformin 3. Rec NaHCO3 gtt 4. Hold Metformin 5. Per #1 4. Hypotension / Shock / Hypothermia likely Sepsis 1. Per critical care 2. On NE + VP + hydrocortisone 3. Volume as above 5. AMS 6. VDRF   Sabra Heck MD [phone] pgr 01/14/2015, 4:41 PM

## 2015-01-14 NOTE — ED Notes (Signed)
Portable at bedside 

## 2015-01-14 NOTE — H&P (Signed)
PULMONARY / CRITICAL CARE MEDICINE   Name: Pamela [name] MRN: [id number] DOB: 07/25/1950    ADMISSION DATE:  01/14/2015 CONSULTATION DATE:  01/14/2015  REFERRING MD :  EDP  CHIEF COMPLAINT:  Acute renal failure, septic shock and respiratory failure.  INITIAL PRESENTATION: 64 year old female with PMH of stage 3 kidney disease who had a biopsy done 9/1 of her left kidney.  For 24 hours prior to presentation continued to deteriorate from a mental status standpoint but LOC on 9/3 and was brought to the ED.  In the ED patient was noted to be in renal failure, hyperkalemic, acidotic and in respiratory failure due to AMS.  Spoke with husband  Who informed me patient would not want to be life support but when informed that this is reversible decision was made to change her to a LCB with no CPR and no cardioversion.  STUDIES:    SIGNIFICANT EVENTS: 9/3 intubated and renal consult called.   HISTORY OF PRESENT ILLNESS:  64 year old female with PMH of stage 3 kidney disease who had a biopsy done 9/1 of her left kidney.  For 24 hours prior to presentation continued to deteriorate from a mental status standpoint but LOC on 9/3 and was brought to the ED.  In the ED patient was noted to be in renal failure, hyperkalemic, acidotic and in respiratory failure due to AMS.  Spoke with husband  Who informed me patient would not want to be life support but when informed that this is reversible decision was made to change her to a LCB with no CPR and no cardioversion.  PAST MEDICAL HISTORY :   has a past medical history of PONV (postoperative nausea and vomiting); Hypertension; COPD (chronic obstructive pulmonary disease); Diabetes mellitus without complication (10-29-12); and Coronary artery disease.  has past surgical history that includes Abdominal hysterectomy; Elbow surgery (Right); Tubal ligation; Breast surgery; Cholecystectomy; Appendectomy; Cardiac catheterization; Ganglion cyst excision (Bilateral,  10-29-12); Anal fissure repair; Colonoscopy with propofol (N/A, 11/17/2012); Esophagogastroduodenoscopy (egd) with propofol (N/A, 11/17/2012); and left heart catheterization with coronary angiogram (N/A, 04/14/2014). Prior to Admission medications   Medication Sig Start Date End Date Taking? Authorizing Provider  amoxicillin-clavulanate (AUGMENTIN) 500-125 MG per tablet Take 1 tablet by mouth 2 (two) times daily. 01/13/15  Yes Historical Provider, MD  predniSONE (STERAPRED UNI-PAK 21 TAB) 10 MG (21) TBPK tablet as directed. 6 day dose pack 01/13/15  Yes Historical Provider, MD  ALPRAZolam (XANAX) 0.5 MG tablet Take 0.5 mg by mouth 2 (two) times daily.    Historical Provider, MD  amLODipine (NORVASC) 10 MG tablet Take 10 mg by mouth daily. 09/02/14   Historical Provider, MD  aspirin EC 81 MG tablet Take 81 mg by mouth daily.    Historical Provider, MD  atorvastatin (LIPITOR) 10 MG tablet Take 10 mg by mouth every morning.    Historical Provider, MD  carvedilol (COREG) 25 MG tablet Take 1 tablet (25 mg total) by mouth 3 (three) times daily. 04/20/14   Lyn Records, MD  furosemide (LASIX) 40 MG tablet Take 1 tablet by mouth  daily Patient taking differently: Take 2 tablet by mouth  daily 07/19/14   Lyn Records, MD  glimepiride (AMARYL) 2 MG tablet Take 2 mg by mouth daily. 09/02/14   Historical Provider, MD  iron polysaccharides (NIFEREX) 150 MG capsule Take 1 capsule (150 mg total) by mouth daily. 03/28/14   Rhetta Mura, MD  isosorbide dinitrate (ISORDIL) 20 MG tablet Take 1 tablet (  20 mg total) by mouth 3 (three) times daily. 07/29/14   Lyn Records, MD  latanoprost (XALATAN) 0.005 % ophthalmic solution Place 1 drop into both eyes at bedtime.    Historical Provider, MD  metFORMIN (GLUCOPHAGE) 500 MG tablet Take 500-1,000 mg by mouth 3 (three) times daily. Takes 1000 mg in the morning, 500 mg at lunch, and 1000 mg at night    Historical Provider, MD  nitroGLYCERIN (NITROSTAT) 0.4 MG SL tablet Place 0.4 mg  under the tongue every 5 (five) minutes as needed for chest pain.    Historical Provider, MD  salmeterol (SEREVENT) 50 MCG/DOSE diskus inhaler Inhale 1 puff into the lungs 2 (two) times daily.    Historical Provider, MD   No Known Allergies  FAMILY HISTORY:  indicated that her mother is deceased. She indicated that her father is deceased. She indicated that her maternal grandmother is deceased. She indicated that her maternal grandfather is deceased. She indicated that her paternal grandmother is deceased. She indicated that her paternal grandfather is deceased.  SOCIAL HISTORY:  reports that she quit smoking about 22 years ago. Her smoking use included Cigarettes. She smoked 1.50 packs per day. She does not have any smokeless tobacco history on file. She reports that she does not drink alcohol or use illicit drugs.  REVIEW OF SYSTEMS:  AMS unattainable.  SUBJECTIVE: Encephalopathic  VITAL SIGNS: Temp:  [90.4 F (32.4 C)] 90.4 F (32.4 C) (09/03 1448) Pulse Rate:  [47-51] 47 (09/03 1554) Resp:  [16-27] 18 (09/03 1554) BP: (74-98)/(37-48) 98/48 mmHg (09/03 1554) SpO2:  [97 %-100 %] 100 % (09/03 1554) Weight:  [50.803 kg (112 lb)] 50.803 kg (112 lb) (09/03 1454) HEMODYNAMICS:   VENTILATOR SETTINGS:   INTAKE / OUTPUT: No intake or output data in the 24 hours ending 01/14/15 1602  PHYSICAL EXAMINATION: General:  Chronically ill appearing female, encephalopathic but arousable. Neuro:  Moves all ext to command but LOC very quickly. HEENT:  [place]/AT, PERRL, EOM-I and MMM. Cardiovascular:  RRR, Nl S1/S2, -M/R/G. Lungs:  CTA bilaterally. Abdomen:  Soft, NT, ND and +BS. Musculoskeletal:  -edema and -tenderness. Skin:  Intact.  LABS:  CBC  Recent Labs Lab 01/12/15 0854 01/14/15 1445 01/14/15 1523  WBC 6.0 4.7  --   HGB 9.4* 8.7* 9.9*  HCT 28.8* 27.5* 29.0*  PLT 150 142*  --    Coag's  Recent Labs Lab 01/12/15 0854  APTT 30  INR 1.10   BMET  Recent Labs Lab  01/14/15 1445 01/14/15 1523  NA 130* 127*  K 6.6* 6.5*  CL 94* 97*  CO2 6*  --   BUN 130* 139*  CREATININE 7.60* 7.60*  GLUCOSE 231* 232*   Electrolytes  Recent Labs Lab 01/14/15 1445  CALCIUM 8.4*   Sepsis Markers  Recent Labs Lab 01/14/15 1458  LATICACIDVEN 14.45*   ABG No results for input(s): PHART, PCO2ART, PO2ART in the last 168 hours. Liver Enzymes  Recent Labs Lab 01/14/15 1445  AST 38  ALT 18  ALKPHOS 43  BILITOT 0.8  ALBUMIN 3.8   Cardiac Enzymes No results for input(s): TROPONINI, PROBNP in the last 168 hours. Glucose  Recent Labs Lab 01/12/15 1125  GLUCAP 216*    Imaging Dg Chest Portable 1 View  01/14/2015   CLINICAL DATA:  Sepsis.  EXAM: PORTABLE CHEST - 1 VIEW  COMPARISON:  03/25/2014.  FINDINGS: Interval borderline enlargement of the cardiac silhouette and mild increase in prominence of the interstitial markings, accentuated by a decreased  inspiration. Calcified granuloma in the right lower lung zone. No pleural fluid. Diffuse osteopenia.  IMPRESSION: Interval borderline cardiomegaly and mildly increased prominence of the interstitial markings, most likely due to a poor inspiration. No gross acute abnormality.   Electronically Signed   By: Beckie Salts M.D.   On: 01/14/2015 15:21   ASSESSMENT / PLAN:  PULMONARY OETT 9/3>>> A: Unable to protect airway. P:   - Full vent support. - Vent bundle. - Titrate O2 for sat of 88-92%. - F/U ABG and adjust vent accordingly. - CXR post intubation.  CARDIOVASCULAR CVL L IJ TLC 9/3>>> L radial a-line 9/3>>> A: Septic shock from presumed UTI. P:  - Sepsis protocol as ordered. - IVF resuscitation. - Bicarb drip. - Tele monitoring.  RENAL A:  Acute on chronic renal failure. Acidosis. Hyperkalemia. P:   - Kayexalate. - Bicarb drip. - Renal to see. - U/s of the kidney for ?hematoma. - CT of the abdomen and pelvis for ?hematoma.  GASTROINTESTINAL A:  No active issues. P:   - OGT. -  TF.  HEMATOLOGIC A:  No active issues. P:  - CBC in AM. - Transfuse per ICU protocol.  INFECTIOUS A:  Likely UTI. P:   BCx2  9/3>>> UC  9/3>>> Sputum 9/3>>> Abx:  Vanc 9/3>>> Zosyn 9/3>>>  Check procalcitonin.  ENDOCRINE A:  No DM history.   P:   - Monitor. - Cortisol level. - Stress dose steroids.  NEUROLOGIC A:  Metabolic encephalopathy. P:   RASS goal: 0 to -2. Versed PRN Fentanyl PRN  FAMILY  - Updates: Spoke with husband, LCB with no CPR or cardioversion.  The patient is critically ill with multiple organ systems failure and requires high complexity decision making for assessment and support, frequent evaluation and titration of therapies, application of advanced monitoring technologies and extensive interpretation of multiple databases.   Critical Care Time devoted to patient care services described in this note is  35  Minutes. This time reflects time of care of this signee Dr Koren Bound. This critical care time does not reflect procedure time, or teaching time or supervisory time of PA/NP/Med student/Med Resident etc but could involve care discussion time.  Alyson Reedy, M.D. [facility], [place] [facility]. Pager: [phone]. After hours pager: [phone].  01/14/2015, 4:02 PM

## 2015-01-14 NOTE — ED Notes (Signed)
bair hugger applied.

## 2015-01-14 NOTE — Procedures (Signed)
Arterial Catheter Insertion Procedure Note Pamela [name] [id number] 30-Sep-1950  Procedure: Insertion of Arterial Catheter  Indications: Blood pressure monitoring and Frequent blood sampling  Procedure Details Consent: Risks of procedure as well as the alternatives and risks of each were explained to the (patient/caregiver).  Consent for procedure obtained. Time Out: Verified patient identification, verified procedure, site/side was marked, verified correct patient position, special equipment/implants available, medications/allergies/relevent history reviewed, required imaging and test results available.  Performed  Maximum sterile technique was used including antiseptics, cap, gloves, gown, hand hygiene, mask and sheet. Skin prep: Chlorhexidine; local anesthetic administered 20 gauge catheter was inserted into left radial artery using the Seldinger technique.  Evaluation Blood flow good; BP tracing good. Complications: No apparent complications.   Koren Bound 01/14/2015

## 2015-01-14 NOTE — ED Notes (Signed)
Arterial line placed in left wrist by Dr. Bard Herbert.

## 2015-01-14 NOTE — Procedures (Signed)
Intubation Procedure Note Pamela [name] [id number] [birth year]  Procedure: Intubation Indications: Airway protection and maintenance  Procedure Details Consent: Unable to obtain consent because of altered level of consciousness. Time Out: Verified patient identification, verified procedure, site/side was marked, verified correct patient position, special equipment/implants available, medications/allergies/relevent history reviewed, required imaging and test results available.  Performed  Maximum sterile technique was used including gloves, gown, hand hygiene, mask and sheet.  MAC and 3    Evaluation Hemodynamic Status: BP stable throughout; O2 sats: stable throughout Patient's Current Condition: stable Complications: No apparent complications Patient did tolerate procedure well. Chest X-ray ordered to verify placement.  CXR: pending.   Pt intubated using glidescope blade # 3 on 1st attempt with no complications. ETT secured at 22 at the lip with commercial tube holder. Pt with bilateral BS, positive color change on ETCO2, and direct visualization, CXR pending. RT will continue to monitor.   [name] 01/14/2015  1

## 2015-01-14 NOTE — Procedures (Signed)
Central Venous Catheter Insertion Procedure Note Pamela [name] [id number] [birth year]  Procedure: Insertion of Central Venous Catheter Indications: Assessment of intravascular volume, Drug and/or fluid administration and Frequent blood sampling  Procedure Details Consent: Risks of procedure as well as the alternatives and risks of each were explained to the (patient/caregiver).  Consent for procedure obtained. Time Out: Verified patient identification, verified procedure, site/side was marked, verified correct patient position, special equipment/implants available, medications/allergies/relevent history reviewed, required imaging and test results available.  Performed  Maximum sterile technique was used including antiseptics, cap, gloves, gown, hand hygiene, mask and sheet. Skin prep: Chlorhexidine; local anesthetic administered A antimicrobial bonded/coated triple lumen catheter was placed in the left internal jugular vein using the Seldinger technique.  Evaluation Blood flow good Complications: No apparent complications Patient did tolerate procedure well. Chest X-ray ordered to verify placement.  CXR: pending.  U/S used in placement.  Pamela [name] 01/14/2015, 4:27 PM

## 2015-01-14 NOTE — Procedures (Signed)
Central Line Insertion Procedure Note  Consent:  Informed consent was obtained from the patient's husband after discussing the risks of the procedure including bleeding, infection, pneumothorax, and potentially death.  Laterality:  Right Femoral Vein  Description of Procedure: Patient was prepped and draped in sterile fashion.  Using bedside ultrasound the patient's right femoral vein was visualized.  The vein was compressible and nonpulsatile.  A total of 37mL of Lidocaine 1% with epinephrine was used to locally anesthetize the patient's skin.  Finder needle was then inserted into the patient's vein under direct ultrasound visualization and dark blood was aspirated.  The syringe was removed form the needle and blood flow was nonpulsatile.  The guidewire was then inserted through the needle into the patient's vein and needle was then removed.  Guidewire positioning in the vein was confirmed with bedside ultrasound. A skin nick was made with a sterile scalpel.  A dilator was passed over the guidewire into the subcutaneous tissue then removed.  The 24cm central venous temporary hemodialysis catheter was then inserted over the guidewire into the vein.  The guidewire was then removed.  All ports were then aspirated and free of air before being flushed with sterile normal saline.  The ports were then clamped.  The catheter was sewen into place and a chlorhexidine bandage was applied.   Complications:  None.  Estimated Blood Loss:  Less than 10cc.  Condition Post Procedure:  Remains critically ill in the intensive care unit.

## 2015-01-14 NOTE — Progress Notes (Addendum)
ADDENDUM Patient starting on CRRT. Vancomycin 1g IV given earlier = 20mg /kg. If she tolerates a full 24h of CRRT, will need a redose of 10mg /kg 24h after loading dose was given. Zosyn changed to 2.25g IV q6h.  Bradely Rudin D. Khamauri Bauernfeind, PharmD, BCPS Clinical Pharmacist Pager: [phone] 01/14/2015 9:57 PM    ANTIBIOTIC CONSULT NOTE - INITIAL  Pharmacy Consult for vanc/zosyn Indication: UTI  No Known Allergies  Patient Measurements: Weight: 112 lb (50.803 kg) Adjusted Body Weight:   Vital Signs: Temp: 90.4 F (32.4 C) (09/03 1448) Temp Source: Rectal (09/03 1448) BP: 98/48 mmHg (09/03 1554) Pulse Rate: 47 (09/03 1554) Intake/Output from previous day:   Intake/Output from this shift:    Labs:  Recent Labs  01/12/15 0854 01/14/15 1445 01/14/15 1523  WBC 6.0 4.7  --   HGB 9.4* 8.7* 9.9*  PLT 150 142*  --   CREATININE  --  7.60* 7.60*   Estimated Creatinine Clearance: 5.6 mL/min (by C-G formula based on Cr of 7.6). No results for input(s): VANCOTROUGH, VANCOPEAK, VANCORANDOM, GENTTROUGH, GENTPEAK, GENTRANDOM, TOBRATROUGH, TOBRAPEAK, TOBRARND, AMIKACINPEAK, AMIKACINTROU, AMIKACIN in the last 72 hours.   Microbiology: No results found for this or any previous visit (from the past 720 hour(s)).  Medical History: Past Medical History  Diagnosis Date  . PONV (postoperative nausea and vomiting)   . Hypertension   . COPD (chronic obstructive pulmonary disease)   . Diabetes mellitus without complication 10-29-12  . Coronary artery disease     Medications:  Scheduled:  . calcium chloride      . fentaNYL      . lidocaine (cardiac) 100 mg/50ml      . midazolam      . rocuronium  1 mg/kg Intravenous Once  . sodium bicarbonate  50 mEq Intravenous Once  . succinylcholine       Infusions:  . sodium chloride    . calcium gluconate 1 GM IV    . norepinephrine (LEVOPHED) Adult infusion    . sodium chloride     Followed by  . sodium chloride    . vancomycin 1,000 mg (01/14/15  1531)  . vasopressin (PITRESSIN) infusion - *FOR SHOCK*     Assessment: 64 yo who was admitted for fatigue. She is being r/o for sepsis now. Vanc and zosyn have been ordered. Pt has a hx of CRF but scr is >7 on this admission. She has gotten both abx in the ED. This should last her for a while.   Goal of Therapy:  Vancomycin trough level 15-20 mcg/ml  Plan:   Vanc 1g x1  Zosyn 3.375g IV x1 then 2.25g IV q8 May need to dose vanc per level for now  Ulyses Southward, PharmD Pager: [phone] 01/14/2015 4:09 PM

## 2015-01-15 ENCOUNTER — Inpatient Hospital Stay (HOSPITAL_COMMUNITY): Payer: Medicare Other

## 2015-01-15 ENCOUNTER — Encounter (HOSPITAL_COMMUNITY): Payer: Self-pay

## 2015-01-15 LAB — BASIC METABOLIC PANEL
Anion gap: 30 — ABNORMAL HIGH (ref 5–15)
BUN: 66 mg/dL — AB (ref 6–20)
CALCIUM: 7.6 mg/dL — AB (ref 8.9–10.3)
CO2: 12 mmol/L — AB (ref 22–32)
CREATININE: 4.32 mg/dL — AB (ref 0.44–1.00)
Chloride: 92 mmol/L — ABNORMAL LOW (ref 101–111)
GFR calc non Af Amer: 10 mL/min — ABNORMAL LOW (ref >60)
GFR, EST AFRICAN AMERICAN: 12 mL/min — AB (ref >60)
Glucose, Bld: 384 mg/dL — ABNORMAL HIGH (ref 65–99)
Potassium: 3.6 mmol/L (ref 3.5–5.1)
SODIUM: 134 mmol/L — AB (ref 135–145)

## 2015-01-15 LAB — BLOOD GAS, ARTERIAL
ACID-BASE DEFICIT: 17.4 mmol/L — AB (ref 0.0–2.0)
Bicarbonate: 9.8 mEq/L — ABNORMAL LOW (ref 20.0–24.0)
DRAWN BY: 41977
FIO2: 0.4
MECHVT: 500 mL
O2 SAT: 99.4 %
PATIENT TEMPERATURE: 93.7
PCO2 ART: 25.9 mmHg — AB (ref 35.0–45.0)
PEEP: 5 cmH2O
PH ART: 7.182 — AB (ref 7.350–7.450)
PO2 ART: 182 mmHg — AB (ref 80.0–100.0)
RATE: 35 resp/min
TCO2: 10.7 mmol/L (ref 0–100)

## 2015-01-15 LAB — RENAL FUNCTION PANEL
ALBUMIN: 3.3 g/dL — AB (ref 3.5–5.0)
Albumin: 3.3 g/dL — ABNORMAL LOW (ref 3.5–5.0)
Anion gap: 30 — ABNORMAL HIGH (ref 5–15)
Anion gap: 7 (ref 5–15)
BUN: 66 mg/dL — AB (ref 6–20)
BUN: 18 mg/dL (ref 6–20)
CALCIUM: 7.4 mg/dL — AB (ref 8.9–10.3)
CO2: 12 mmol/L — AB (ref 22–32)
CO2: 29 mmol/L (ref 22–32)
CREATININE: 1.54 mg/dL — AB (ref 0.44–1.00)
Calcium: 7.7 mg/dL — ABNORMAL LOW (ref 8.9–10.3)
Chloride: 94 mmol/L — ABNORMAL LOW (ref 101–111)
Chloride: 101 mmol/L (ref 101–111)
Creatinine, Ser: 4.34 mg/dL — ABNORMAL HIGH (ref 0.44–1.00)
GFR calc Af Amer: 11 mL/min — ABNORMAL LOW (ref >60)
GFR calc non Af Amer: 10 mL/min — ABNORMAL LOW (ref >60)
GFR, EST AFRICAN AMERICAN: 40 mL/min — AB (ref >60)
GFR, EST NON AFRICAN AMERICAN: 35 mL/min — AB (ref >60)
GLUCOSE: 377 mg/dL — AB (ref 65–99)
Glucose, Bld: 116 mg/dL — ABNORMAL HIGH (ref 65–99)
Phosphorus: 5.9 mg/dL — ABNORMAL HIGH (ref 2.5–4.6)
Phosphorus: 1.8 mg/dL — ABNORMAL LOW (ref 2.5–4.6)
Potassium: 3.7 mmol/L (ref 3.5–5.1)
Potassium: 3.4 mmol/L — ABNORMAL LOW (ref 3.5–5.1)
SODIUM: 137 mmol/L (ref 135–145)
Sodium: 136 mmol/L (ref 135–145)

## 2015-01-15 LAB — CBC
HEMATOCRIT: 24.1 % — AB (ref 36.0–46.0)
Hemoglobin: 7.9 g/dL — ABNORMAL LOW (ref 12.0–15.0)
MCH: 29.7 pg (ref 26.0–34.0)
MCHC: 32.8 g/dL (ref 30.0–36.0)
MCV: 90.6 fL (ref 78.0–100.0)
Platelets: 114 10*3/uL — ABNORMAL LOW (ref 150–400)
RBC: 2.66 MIL/uL — ABNORMAL LOW (ref 3.87–5.11)
RDW: 15.5 % (ref 11.5–15.5)
WBC: 8.7 10*3/uL (ref 4.0–10.5)

## 2015-01-15 LAB — URINE MICROSCOPIC-ADD ON

## 2015-01-15 LAB — URINALYSIS, ROUTINE W REFLEX MICROSCOPIC
GLUCOSE, UA: 250 mg/dL — AB
Nitrite: POSITIVE — AB
Specific Gravity, Urine: 1.013 (ref 1.005–1.030)
Urobilinogen, UA: 4 mg/dL — ABNORMAL HIGH (ref 0.0–1.0)
pH: 5 (ref 5.0–8.0)

## 2015-01-15 LAB — GLUCOSE, CAPILLARY
GLUCOSE-CAPILLARY: 376 mg/dL — AB (ref 65–99)
GLUCOSE-CAPILLARY: 323 mg/dL — AB (ref 65–99)
GLUCOSE-CAPILLARY: 199 mg/dL — AB (ref 65–99)
GLUCOSE-CAPILLARY: 103 mg/dL — AB (ref 65–99)
GLUCOSE-CAPILLARY: 108 mg/dL — AB (ref 65–99)

## 2015-01-15 LAB — PHOSPHORUS
PHOSPHORUS: 5.9 mg/dL — AB (ref 2.5–4.6)
Phosphorus: 2.5 mg/dL (ref 2.5–4.6)
Phosphorus: 1.8 mg/dL — ABNORMAL LOW (ref 2.5–4.6)

## 2015-01-15 LAB — CG4 I-STAT (LACTIC ACID)
Lactic Acid, Venous: 11.81 mmol/L (ref 0.5–2.0)
Lactic Acid, Venous: >17 mmol/L (ref 0.5–2.0)

## 2015-01-15 LAB — MAGNESIUM
Magnesium: 1.8 mg/dL (ref 1.7–2.4)
Magnesium: 1.9 mg/dL (ref 1.7–2.4)
Magnesium: 2.4 mg/dL (ref 1.7–2.4)

## 2015-01-15 MED ORDER — VANCOMYCIN HCL IN DEXTROSE 750-5 MG/150ML-% IV SOLN
750.0000 mg | INTRAVENOUS | Status: DC
  Administered 2015-01-15: 750 mg via INTRAVENOUS
  Filled 2015-01-15 (×3): qty 150

## 2015-01-15 MED ORDER — MAGNESIUM SULFATE 2 GM/50ML IV SOLN
2.0000 g | Freq: Once | INTRAVENOUS | Status: AC
  Administered 2015-01-15: 2 g via INTRAVENOUS
  Filled 2015-01-15: qty 50

## 2015-01-15 MED ORDER — SODIUM PHOSPHATE 3 MMOLE/ML IV SOLN
20.0000 mmol | Freq: Once | INTRAVENOUS | Status: AC
  Administered 2015-01-15: 20 mmol via INTRAVENOUS
  Filled 2015-01-15: qty 6.67

## 2015-01-15 MED ORDER — PRO-STAT SUGAR FREE PO LIQD
30.0000 mL | Freq: Two times a day (BID) | ORAL | Status: DC
  Filled 2015-01-15 (×2): qty 30

## 2015-01-15 MED ORDER — VITAL HIGH PROTEIN PO LIQD
1000.0000 mL | ORAL | Status: DC
  Filled 2015-01-15 (×3): qty 1000

## 2015-01-15 MED ORDER — INSULIN ASPART 100 UNIT/ML ~~LOC~~ SOLN
0.0000 [IU] | SUBCUTANEOUS | Status: DC
  Administered 2015-01-15: 20 [IU] via SUBCUTANEOUS
  Administered 2015-01-15 – 2015-01-16 (×2): 4 [IU] via SUBCUTANEOUS
  Administered 2015-01-16: 3 [IU] via SUBCUTANEOUS
  Administered 2015-01-16 (×2): 4 [IU] via SUBCUTANEOUS
  Administered 2015-01-16: 3 [IU] via SUBCUTANEOUS
  Administered 2015-01-17: 4 [IU] via SUBCUTANEOUS
  Administered 2015-01-17 (×2): 3 [IU] via SUBCUTANEOUS
  Administered 2015-01-18: 11 [IU] via SUBCUTANEOUS
  Administered 2015-01-18: 4 [IU] via SUBCUTANEOUS
  Administered 2015-01-18: 7 [IU] via SUBCUTANEOUS
  Administered 2015-01-18: 11 [IU] via SUBCUTANEOUS
  Administered 2015-01-18: 7 [IU] via SUBCUTANEOUS
  Administered 2015-01-18: 4 [IU] via SUBCUTANEOUS
  Administered 2015-01-19: 15 [IU] via SUBCUTANEOUS
  Administered 2015-01-19: 7 [IU] via SUBCUTANEOUS
  Administered 2015-01-19: 4 [IU] via SUBCUTANEOUS
  Administered 2015-01-19 (×2): 7 [IU] via SUBCUTANEOUS
  Administered 2015-01-20 (×2): 4 [IU] via SUBCUTANEOUS
  Administered 2015-01-20: 11 [IU] via SUBCUTANEOUS
  Administered 2015-01-20: 4 [IU] via SUBCUTANEOUS
  Administered 2015-01-20: 11 [IU] via SUBCUTANEOUS
  Administered 2015-01-21 (×2): 7 [IU] via SUBCUTANEOUS
  Administered 2015-01-21: 3 [IU] via SUBCUTANEOUS

## 2015-01-15 NOTE — Progress Notes (Addendum)
Rn and RT at bedside. Pt extubated per MD order. Pt placed on 3L nasal canula. Pt A&O. Pts vital signs are stable. Bilat breath sounds present. Will continue to monitor pt.

## 2015-01-15 NOTE — Procedures (Signed)
Admit: 01/14/2015 LOS: 1  75F s/p renal Bx 9/1 for idiopathic progressive CKD and proteinuria returning with hypotension, hypothermia, AoCKD, severe azotemia, hyperkalemia, inc AG metabolic acidosis win inc lactate, AMS, VDRF 2/2 AMS,   Current CRRT Prescription: Start Date: 01/14/15  Catheter: R Femoral placed 9/3 BFR: 150 Pre Blood Pump: 1500 4K DFR: 2000 4K Replacement Rate: 1500 4K Goal UF: Net Even Anticoagulation: none in system or circuit Clotting: none   S: Hemodynamics improved overnight Low dose NE and VP Awake, alert, FC CVP >10 K and HCO3 improved  O: 09/03 0701 - 09/04 0700 In: 5125.8 [I.V.:4965.8; NG/GT:60; IV Piggyback:100] Out: 1679   Filed Weights   01/14/15 1454 01/15/15 0400  Weight: 50.803 kg (112 lb) 58.3 kg (128 lb 8.5 oz)     Recent Labs Lab 01/14/15 1445 01/14/15 1523 01/14/15 1806 01/15/15 0507  NA 130* 127* 131* 136  134*  K 6.6* 6.5* 6.3* 3.7  3.6  CL 94* 97* 95* 94*  92*  CO2 6*  --  <5* 12*  12*  GLUCOSE 231* 232* 262* 377*  384*  BUN 130* 139* 117* 66*  66*  CREATININE 7.60* 7.60* 7.09* 4.34*  4.32*  CALCIUM 8.4*  --  8.5* 7.7*  7.6*  PHOS  --   --   --  5.9*  5.9*    Recent Labs Lab 01/14/15 1445 01/14/15 1523 01/14/15 1806 01/15/15 0507  WBC 4.7  --  5.1 8.7  NEUTROABS 4.2  --   --   --   HGB 8.7* 9.9* 8.4* 7.9*  HCT 27.5* 29.0* 27.0* 24.1*  MCV 96.8  --  95.7 90.6  PLT 142*  --  159 114*    Scheduled Meds: . antiseptic oral rinse  7 mL Mouth Rinse QID  . chlorhexidine gluconate  15 mL Mouth Rinse BID  . desmopressin (DDAVP) IV  20 mcg Intravenous Once  . hydrocortisone sodium succinate  50 mg Intravenous Q6H  . insulin aspart  0-20 Units Subcutaneous 6 times per day  . pantoprazole (PROTONIX) IV  40 mg Intravenous Q24H  . piperacillin-tazobactam (ZOSYN)  IV  2.25 g Intravenous 4 times per day   Continuous Infusions: . norepinephrine (LEVOPHED) Adult infusion 5 mcg/min (01/15/15 0600)  . dialysis  replacement fluid (prismasate) 1,500 mL/hr at 01/15/15 0629  . dialysis replacement fluid (prismasate) 1,500 mL/hr at 01/15/15 0630  . dialysate (PRISMASATE) 2,000 mL/hr at 01/15/15 0437  .  sodium bicarbonate  infusion 1000 mL 150 mL/hr at 01/15/15 0010  . vasopressin (PITRESSIN) infusion - *FOR SHOCK* 0.03 Units/min (01/15/15 0500)   PRN Meds:.calcium chloride, etomidate, fentaNYL (SUBLIMAZE) injection, heparin, midazolam, rocuronium, sodium bicarbonate, sodium chloride  ABG    Component Value Date/Time   PHART 7.182* 01/15/2015 0400   PCO2ART 25.9* 01/15/2015 0400   PO2ART 182* 01/15/2015 0400   HCO3 9.8* 01/15/2015 0400   TCO2 10.7 01/15/2015 0400   ACIDBASEDEF 17.4* 01/15/2015 0400   O2SAT 99.4 01/15/2015 0400    A/P  1. Dialysis dependent AoCKD 1. S/p renal Bx 9/1 1. 9/3 Renal US w/o obstruction or HN, no bloody urine 2. Path pending 2. On CRRT 1. HyperK resolved 2. Acidosis improving 3. TOlerating net even for UF 3. Likely Sepsis + ATN, could have been hypovolemic too 4. No UOP to date 2. HyperK: Resolved 3. Severe inc AG Met Acidosis w/ lactate and renal failure 1. Improving, Lactate falling  2. High CRRT settings + ongoing NaHCO3 gtt 3. BP improved 4. Septic Shock:  ABX and Tx per CCM 5. VDRF: per CCM 6.   Pearson Grippe, MD [facility] pgr [phone]

## 2015-01-15 NOTE — Progress Notes (Signed)
ANTIBIOTIC CONSULT NOTE - INITIAL  Pharmacy Consult for vanc/zosyn Indication: UTI/sepsis  No Known Allergies  Patient Measurements: Weight: 128 lb 8.5 oz (58.3 kg) Adjusted Body Weight:   Vital Signs: Temp: 96.2 F (35.7 C) (09/04 0800) Temp Source: Core (Comment) (09/04 0800) BP: 139/49 mmHg (09/04 0800) Pulse Rate: 94 (09/04 0800) Intake/Output from previous day: 09/03 0701 - 09/04 0700 In: 5317.2 [I.V.:5157.2; NG/GT:60; IV Piggyback:100] Out: 1679  Intake/Output from this shift: Total I/O In: 25.8 [I.V.:25.8] Out: 188 [Other:188]  Labs:  Recent Labs  01/14/15 1445 01/14/15 1523 01/14/15 1806 01/15/15 0507  WBC 4.7  --  5.1 8.7  HGB 8.7* 9.9* 8.4* 7.9*  PLT 142*  --  159 114*  CREATININE 7.60* 7.60* 7.09* 4.34*  4.32*   Estimated Creatinine Clearance: 10.8 mL/min (by C-G formula based on Cr of 4.32). No results for input(s): VANCOTROUGH, VANCOPEAK, VANCORANDOM, GENTTROUGH, GENTPEAK, GENTRANDOM, TOBRATROUGH, TOBRAPEAK, TOBRARND, AMIKACINPEAK, AMIKACINTROU, AMIKACIN in the last 72 hours.   Microbiology: Recent Results (from the past 720 hour(s))  MRSA PCR Screening     Status: None   Collection Time: 01/14/15  6:00 PM  Result Value Ref Range Status   MRSA by PCR NEGATIVE NEGATIVE Final    Comment:        The GeneXpert MRSA Assay (FDA approved for NASAL specimens only), is one component of a comprehensive MRSA colonization surveillance program. It is not intended to diagnose MRSA infection nor to guide or monitor treatment for MRSA infections.     Medical History: Past Medical History  Diagnosis Date  . PONV (postoperative nausea and vomiting)   . Hypertension   . COPD (chronic obstructive pulmonary disease)   . Diabetes mellitus without complication 10-29-12  . Coronary artery disease   . Shortness of breath dyspnea   . Anxiety   . Chronic kidney disease     Medications:  Scheduled:  . antiseptic oral rinse  7 mL Mouth Rinse QID  .  chlorhexidine gluconate  15 mL Mouth Rinse BID  . desmopressin (DDAVP) IV  20 mcg Intravenous Once  . hydrocortisone sodium succinate  50 mg Intravenous Q6H  . insulin aspart  0-20 Units Subcutaneous 6 times per day  . pantoprazole (PROTONIX) IV  40 mg Intravenous Q24H  . piperacillin-tazobactam (ZOSYN)  IV  2.25 g Intravenous 4 times per day  . vancomycin  750 mg Intravenous Q24H   Infusions:  . norepinephrine (LEVOPHED) Adult infusion 2 mcg/min (01/15/15 0810)  . dialysis replacement fluid (prismasate) 1,500 mL/hr at 01/15/15 0629  . dialysis replacement fluid (prismasate) 1,500 mL/hr at 01/15/15 0630  . dialysate (PRISMASATE) 2,000 mL/hr at 01/15/15 0437  .  sodium bicarbonate  infusion 1000 mL 150 mL/hr at 01/15/15 0733  . vasopressin (PITRESSIN) infusion - *FOR SHOCK* Stopped (01/15/15 4008)   Assessment: 64 yo who was admitted for fatigue. She is being r/o for sepsis now. Vanc and zosyn have been ordered. Pt has a hx of CRF but scr is >7 on this admission. She has gotten both abx in the ED. CRRT started last PM and has been running since then. Going to start a scheduled vanc today. Zosyn has been adjusted.   Goal of Therapy:  Vancomycin trough level 15-20 mcg/ml  Plan:   Vanc 750mg  IV q24 Zosyn 2.25g IV q6 Will get level as needed  Ulyses Southward, PharmD Pager: [phone] 01/15/2015 8:52 AM

## 2015-01-15 NOTE — Progress Notes (Signed)
eLink Physician-Brief Progress Note Patient Name: Pamela [name] DOB: [birth year] MRN: [id number]   Date of Service  01/15/2015  HPI/Events of Note  High sugars - on bicarb gtt in D5 - on steroids  eICU Interventions  SSI -resistant     Intervention Category Intermediate Interventions: Hyperglycemia - evaluation and treatment  Marielis Samara V. 01/15/2015, 6:13 AM

## 2015-01-15 NOTE — Progress Notes (Signed)
Pt extubated to 2L nasal cannula. Vital signs are stable.

## 2015-01-15 NOTE — Progress Notes (Signed)
CRITICAL VALUE ALERT  Critical value received:  Lactic acid> 17  Date of notification:  01/15/15  Time of notification:  0024  Critical value read back:Yes.    Nurse who received alert:  Dustin Flock   MD notified (1st page):  Dr Vassie Loll  Time of first page:  0030  MD notified (2nd page):  Time of second page:  Responding MD:  Dr Vassie Loll  Time MD responded:  [phone]

## 2015-01-15 NOTE — Progress Notes (Signed)
PULMONARY / CRITICAL CARE MEDICINE   Name: Pamela [name] MRN: [id number] DOB: Mar 04, 1951    ADMISSION DATE:  01/14/2015 CONSULTATION DATE:  01/14/2015  REFERRING MD :  EDP  CHIEF COMPLAINT:  Acute renal failure, septic shock and respiratory failure.  INITIAL PRESENTATION: 64 year old female with PMH of stage 3 kidney disease who had a biopsy done 9/1 of her left kidney.  For 24 hours prior to presentation continued to deteriorate from a mental status standpoint but LOC on 9/3 and was brought to the ED.  In the ED patient was noted to be in renal failure, hyperkalemic, acidotic and in respiratory failure due to AMS.  Spoke with husband  Who informed me patient would not want to be life support but when informed that this is reversible decision was made to change her to a LCB with no CPR and no cardioversion.  STUDIES:    SIGNIFICANT EVENTS: 9/3 intubated and renal consult called.   HISTORY OF PRESENT ILLNESS:  64 year old female with PMH of stage 3 kidney disease who had a biopsy done 9/1 of her left kidney.  For 24 hours prior to presentation continued to deteriorate from a mental status standpoint but LOC on 9/3 and was brought to the ED.  In the ED patient was noted to be in renal failure, hyperkalemic, acidotic and in respiratory failure due to AMS.  Spoke with husband  Who informed me patient would not want to be life support but when informed that this is reversible decision was made to change her to a LCB with no CPR and no cardioversion.  PAST MEDICAL HISTORY :   has a past medical history of PONV (postoperative nausea and vomiting); Hypertension; COPD (chronic obstructive pulmonary disease); Diabetes mellitus without complication (10-29-12); Coronary artery disease; Shortness of breath dyspnea; Anxiety; and Chronic kidney disease.  has past surgical history that includes Abdominal hysterectomy; Elbow surgery (Right); Tubal ligation; Breast surgery; Cholecystectomy; Appendectomy;  Cardiac catheterization; Ganglion cyst excision (Bilateral, 10-29-12); Anal fissure repair; Colonoscopy with propofol (N/A, 11/17/2012); Esophagogastroduodenoscopy (egd) with propofol (N/A, 11/17/2012); and left heart catheterization with coronary angiogram (N/A, 04/14/2014). Prior to Admission medications   Medication Sig Start Date End Date Taking? Authorizing Provider  amoxicillin-clavulanate (AUGMENTIN) 500-125 MG per tablet Take 1 tablet by mouth 2 (two) times daily. 01/13/15  Yes Historical Provider, MD  predniSONE (STERAPRED UNI-PAK 21 TAB) 10 MG (21) TBPK tablet as directed. 6 day dose pack 01/13/15  Yes Historical Provider, MD  ALPRAZolam (XANAX) 0.5 MG tablet Take 0.5 mg by mouth 2 (two) times daily.    Historical Provider, MD  amLODipine (NORVASC) 10 MG tablet Take 10 mg by mouth daily. 09/02/14   Historical Provider, MD  aspirin EC 81 MG tablet Take 81 mg by mouth daily.    Historical Provider, MD  atorvastatin (LIPITOR) 10 MG tablet Take 10 mg by mouth every morning.    Historical Provider, MD  carvedilol (COREG) 25 MG tablet Take 1 tablet (25 mg total) by mouth 3 (three) times daily. 04/20/14   Lyn Records, MD  furosemide (LASIX) 40 MG tablet Take 1 tablet by mouth  daily Patient taking differently: Take 2 tablet by mouth  daily 07/19/14   Lyn Records, MD  glimepiride (AMARYL) 2 MG tablet Take 2 mg by mouth daily. 09/02/14   Historical Provider, MD  iron polysaccharides (NIFEREX) 150 MG capsule Take 1 capsule (150 mg total) by mouth daily. 03/28/14   Rhetta Mura, MD  isosorbide  dinitrate (ISORDIL) 20 MG tablet Take 1 tablet (20 mg total) by mouth 3 (three) times daily. 07/29/14   Lyn Records, MD  latanoprost (XALATAN) 0.005 % ophthalmic solution Place 1 drop into both eyes at bedtime.    Historical Provider, MD  metFORMIN (GLUCOPHAGE) 500 MG tablet Take 500-1,000 mg by mouth 3 (three) times daily. Takes 1000 mg in the morning, 500 mg at lunch, and 1000 mg at night    Historical Provider,  MD  nitroGLYCERIN (NITROSTAT) 0.4 MG SL tablet Place 0.4 mg under the tongue every 5 (five) minutes as needed for chest pain.    Historical Provider, MD  salmeterol (SEREVENT) 50 MCG/DOSE diskus inhaler Inhale 1 puff into the lungs 2 (two) times daily.    Historical Provider, MD   No Known Allergies  FAMILY HISTORY:  indicated that her mother is deceased. She indicated that her father is deceased. She indicated that her maternal grandmother is deceased. She indicated that her maternal grandfather is deceased. She indicated that her paternal grandmother is deceased. She indicated that her paternal grandfather is deceased.  SOCIAL HISTORY:  reports that she quit smoking about 22 years ago. Her smoking use included Cigarettes. She smoked 1.50 packs per day. She does not have any smokeless tobacco history on file. She reports that she does not drink alcohol or use illicit drugs.  REVIEW OF SYSTEMS:  AMS unattainable.  SUBJECTIVE: Encephalopathic  VITAL SIGNS: Temp:  [90.4 F (32.4 C)-96.2 F (35.7 C)] 96.2 F (35.7 C) (09/04 0800) Pulse Rate:  [38-94] 94 (09/04 0800) Resp:  [11-37] 29 (09/04 0800) BP: (74-139)/(36-60) 139/49 mmHg (09/04 0800) SpO2:  [97 %-100 %] 100 % (09/04 0800) Arterial Line BP: (95-167)/(31-49) 167/49 mmHg (09/04 0800) FiO2 (%):  [30 %-100 %] 30 % (09/04 0800) Weight:  [50.803 kg (112 lb)-58.3 kg (128 lb 8.5 oz)] 58.3 kg (128 lb 8.5 oz) (09/04 0400) HEMODYNAMICS: CVP:  [9 mmHg-63 mmHg] 11 mmHg VENTILATOR SETTINGS: Vent Mode:  [-] PRVC FiO2 (%):  [30 %-100 %] 30 % Set Rate:  [30 bmp-35 bmp] 35 bmp Vt Set:  [390 mL-500 mL] 500 mL PEEP:  [5 cmH20] 5 cmH20 Plateau Pressure:  [12 cmH20-32 cmH20] 29 cmH20 INTAKE / OUTPUT:  Intake/Output Summary (Last 24 hours) at 01/15/15 0908 Last data filed at 01/15/15 0900  Gross per 24 hour  Intake 5342.94 ml  Output   2038 ml  Net 3304.94 ml    PHYSICAL EXAMINATION: General:  Chronically ill appearing female, alert and  interactive, moving all ext to command. Neuro:  Moves all ext to command. HEENT:  [place]/AT, PERRL, EOM-I and MMM. Cardiovascular:  RRR, Nl S1/S2, -M/R/G. Lungs:  CTA bilaterally. Abdomen:  Soft, NT, ND and +BS. Musculoskeletal:  -edema and -tenderness. Skin:  Intact.  LABS:  CBC  Recent Labs Lab 01/14/15 1445 01/14/15 1523 01/14/15 1806 01/15/15 0507  WBC 4.7  --  5.1 8.7  HGB 8.7* 9.9* 8.4* 7.9*  HCT 27.5* 29.0* 27.0* 24.1*  PLT 142*  --  159 114*   Coag's  Recent Labs Lab 01/12/15 0854 01/14/15 1806  APTT 30 35  INR 1.10 1.92*   BMET  Recent Labs Lab 01/14/15 1445 01/14/15 1523 01/14/15 1806 01/15/15 0507  NA 130* 127* 131* 136  134*  K 6.6* 6.5* 6.3* 3.7  3.6  CL 94* 97* 95* 94*  92*  CO2 6*  --  <5* 12*  12*  BUN 130* 139* 117* 66*  66*  CREATININE 7.60* 7.60* 7.09*  4.34*  4.32*  GLUCOSE 231* 232* 262* 377*  384*   Electrolytes  Recent Labs Lab 01/14/15 1445 01/14/15 1806 01/15/15 0507  CALCIUM 8.4* 8.5* 7.7*  7.6*  MG  --   --  1.8  PHOS  --   --  5.9*  5.9*   Sepsis Markers  Recent Labs Lab 01/14/15 1910 01/15/15 0024 01/15/15 0433  LATICACIDVEN 16.5* >17.00* 11.81*   ABG  Recent Labs Lab 01/14/15 2018 01/14/15 2037 01/15/15 0400  PHART 7.003* 7.000* 7.182*  PCO2ART 32.5* 28.8* 25.9*  PO2ART 267.0* 170.0* 182*   Liver Enzymes  Recent Labs Lab 01/14/15 1445 01/14/15 1806 01/15/15 0507  AST 38 56*  --   ALT 18 27  --   ALKPHOS 43 42  --   BILITOT 0.8 1.0  --   ALBUMIN 3.8 3.3* 3.3*   Cardiac Enzymes  Recent Labs Lab 01/14/15 1806  TROPONINI <0.03   Glucose  Recent Labs Lab 01/12/15 1125 01/14/15 1801 01/15/15 0617 01/15/15 0743  GLUCAP 216* 255* 376* 323*    Imaging US Renal  01/14/2015   CLINICAL DATA:  Renal failure. History of hypertension, COPD, diabetes.  EXAM: RENAL / URINARY TRACT ULTRASOUND COMPLETE  COMPARISON:  10/21/2014  FINDINGS: Right Kidney:  Length: 9.9 cm. Renal parenchyma is  echogenic. Multiple small cysts are identified. The largest is seen in the lower pole region measuring 1.1 cm. No hydronephrosis.  Left Kidney:  Length: 10.2 cm. Echogenic renal parenchyma. Multiple small cysts are identified, largest in the lower pole region which measures 1.3 cm.  Bladder:  A Foley catheter decompresses the bladder.  Additional:  Note is made of a small amount of ascites.  IMPRESSION: 1. Echogenic kidneys bilaterally.  Bilateral renal cysts. 2. No hydronephrosis. 3. Small amount of ascites noted.   Electronically Signed   By: Norva Pavlov M.D.   On: 01/14/2015 17:44   Dg Chest Port 1 View  01/15/2015   CLINICAL DATA:  Septic shock, respiratory failure and acute renal failure. Question left lower lobe pneumonia.  EXAM: PORTABLE CHEST - 1 VIEW  COMPARISON:  01/14/2015  FINDINGS: Endotracheal tube with tip 3.2 cm above the carina, left IJ central venous catheter with tip overlying the superior cavoatrial junction NG tube entering the stomach with tip off the field of view noted.  Left lower lung opacity has improved.  There is no evidence of pleural effusion or pneumothorax.  IMPRESSION: Improved left lower lung opacity, suggesting atelectasis.  No other significant change.   Electronically Signed   By: Harmon Pier M.D.   On: 01/15/2015 07:32   Dg Chest Port 1 View  01/14/2015   CLINICAL DATA:  Septic shock, respiratory failure and acute renal failure. Endotracheal tube and central line placement.  EXAM: PORTABLE CHEST - 1 VIEW  COMPARISON:  01/14/2015 and prior radiographs  FINDINGS: The cardiomediastinal silhouette is unchanged.  An endotracheal tube with tip 5.1 cm above the carina, left IJ central venous catheter with tip overlying the lower SVC and NG tube entering the stomach with tip off the field of view are noted.  Left lower lung airspace disease is present.  There is no evidence of pneumothorax or pleural effusion.  IMPRESSION: Left lower lung airspace disease/ pneumonia.  Support  apparatus as described.  No evidence of pneumothorax.   Electronically Signed   By: Harmon Pier M.D.   On: 01/14/2015 16:38   Dg Chest Portable 1 View  01/14/2015   CLINICAL DATA:  Sepsis.  EXAM: PORTABLE CHEST - 1 VIEW  COMPARISON:  03/25/2014.  FINDINGS: Interval borderline enlargement of the cardiac silhouette and mild increase in prominence of the interstitial markings, accentuated by a decreased inspiration. Calcified granuloma in the right lower lung zone. No pleural fluid. Diffuse osteopenia.  IMPRESSION: Interval borderline cardiomegaly and mildly increased prominence of the interstitial markings, most likely due to a poor inspiration. No gross acute abnormality.   Electronically Signed   By: Beckie Salts M.D.   On: 01/14/2015 15:21   ASSESSMENT / PLAN:  PULMONARY OETT 9/3>>> A: Unable to protect airway. P:   - SBT to extubate today. - Titrate O2 for sat of 88-92%. - IS per RT protocol.  CARDIOVASCULAR CVL L IJ TLC 9/3>>> L radial a-line 9/3>>> A: Septic shock from presumed UTI. P:  - Sepsis protocol as ordered. - KVO IVF. - D/C Bicarb drip. - Tele monitoring. - D/C pressors.  RENAL A:  Acute on chronic renal failure. Acidosis. Hyperkalemia. P:   - D/C Kayexalate. - D/C Bicarb drip. - Renal input appreciated. - U/S of the kidney negative for hematoma. - CT of the abdomen and pelvis pending  GASTROINTESTINAL A:  No active issues. P:   - D/C OGT. - D/C TF.  HEMATOLOGIC A:  No active issues. P:  - CBC in AM. - Transfuse per ICU protocol.  INFECTIOUS A:  Likely UTI. P:   BCx2  9/3>>> UC  9/3>>> Sputum 9/3>>> Abx:  Vanc 9/3>>> Zosyn 9/3>>>  Check procalcitonin.  ENDOCRINE A:  No DM history.   P:   - Monitor. - Cortisol noted 27.8. - D/C stress dose steroids.  NEUROLOGIC A:  Metabolic encephalopathy. P:   - D/C Versed - D/C Fentanyl  FAMILY  - Updates: Spoke with husband, LCB with no CPR or cardioversion.  The patient is critically ill  with multiple organ systems failure and requires high complexity decision making for assessment and support, frequent evaluation and titration of therapies, application of advanced monitoring technologies and extensive interpretation of multiple databases.   Critical Care Time devoted to patient care services described in this note is  35  Minutes. This time reflects time of care of this signee Dr Koren Bound. This critical care time does not reflect procedure time, or teaching time or supervisory time of PA/NP/Med student/Med Resident etc but could involve care discussion time.  Alyson Reedy, M.D. [facility] Pulmonary/Critical Care Medicine. Pager: [phone]. After hours pager: [phone].  01/15/2015, 9:08 AM

## 2015-01-15 NOTE — Progress Notes (Signed)
Utilization Review Completed.Pamela [name] T9/08/2014  

## 2015-01-15 NOTE — Progress Notes (Signed)
Brief Nutrition Note  Consult received for enteral/tube feeding initiation and management.  Adult Enteral Nutrition Protocol initiated. Full assessment to follow.  Admitting Dx: Renal failure [N19] Encephalopathy [G93.40] History of ETT [Z92.89]  Body mass index is 24.3 kg/(m^2). Pt meets criteria for normal range based on current BMI.  Labs:   Recent Labs Lab 01/14/15 1445 01/14/15 1523 01/14/15 1806 01/15/15 0507  NA 130* 127* 131* 136  134*  K 6.6* 6.5* 6.3* 3.7  3.6  CL 94* 97* 95* 94*  92*  CO2 6*  --  <5* 12*  12*  BUN 130* 139* 117* 66*  66*  CREATININE 7.60* 7.60* 7.09* 4.34*  4.32*  CALCIUM 8.4*  --  8.5* 7.7*  7.6*  MG  --   --   --  1.8  PHOS  --   --   --  5.9*  5.9*  GLUCOSE 231* 232* 262* 377*  384*    Tilda Franco, MS, RD, LDN Pager: [phone] After Hours Pager: [phone]

## 2015-01-16 LAB — CBC
HCT: 24.2 % — ABNORMAL LOW (ref 36.0–46.0)
Hemoglobin: 8 g/dL — ABNORMAL LOW (ref 12.0–15.0)
MCH: 29.5 pg (ref 26.0–34.0)
MCHC: 33.1 g/dL (ref 30.0–36.0)
MCV: 89.3 fL (ref 78.0–100.0)
PLATELETS: 88 10*3/uL — AB (ref 150–400)
RBC: 2.71 MIL/uL — ABNORMAL LOW (ref 3.87–5.11)
RDW: 16.2 % — AB (ref 11.5–15.5)
WBC: 9.9 10*3/uL (ref 4.0–10.5)

## 2015-01-16 LAB — PHOSPHORUS
PHOSPHORUS: 2.2 mg/dL — AB (ref 2.5–4.6)
PHOSPHORUS: 1.8 mg/dL — AB (ref 2.5–4.6)
Phosphorus: 2.8 mg/dL (ref 2.5–4.6)

## 2015-01-16 LAB — GLUCOSE, CAPILLARY
GLUCOSE-CAPILLARY: 144 mg/dL — AB (ref 65–99)
GLUCOSE-CAPILLARY: 162 mg/dL — AB (ref 65–99)
GLUCOSE-CAPILLARY: 193 mg/dL — AB (ref 65–99)
Glucose-Capillary: 128 mg/dL — ABNORMAL HIGH (ref 65–99)
Glucose-Capillary: 120 mg/dL — ABNORMAL HIGH (ref 65–99)
Glucose-Capillary: 188 mg/dL — ABNORMAL HIGH (ref 65–99)
Glucose-Capillary: 116 mg/dL — ABNORMAL HIGH (ref 65–99)

## 2015-01-16 LAB — POCT I-STAT 3, ART BLOOD GAS (G3+)
Acid-base deficit: 7 mmol/L — ABNORMAL HIGH (ref 0.0–2.0)
Bicarbonate: 20.7 mEq/L (ref 20.0–24.0)
O2 SAT: 90 %
PCO2 ART: 52.4 mmHg — AB (ref 35.0–45.0)
PH ART: 7.199 — AB (ref 7.350–7.450)
TCO2: 22 mmol/L (ref 0–100)
pO2, Arterial: 68 mmHg — ABNORMAL LOW (ref 80.0–100.0)

## 2015-01-16 LAB — RENAL FUNCTION PANEL
Albumin: 3.4 g/dL — ABNORMAL LOW (ref 3.5–5.0)
Anion gap: 7 (ref 5–15)
BUN: <5 mg/dL — ABNORMAL LOW (ref 6–20)
CO2: 25 mmol/L (ref 22–32)
Calcium: 7.5 mg/dL — ABNORMAL LOW (ref 8.9–10.3)
Chloride: 103 mmol/L (ref 101–111)
Creatinine, Ser: 0.78 mg/dL (ref 0.44–1.00)
GFR calc Af Amer: >60 mL/min
GFR calc non Af Amer: >60 mL/min
Glucose, Bld: 162 mg/dL — ABNORMAL HIGH (ref 65–99)
Phosphorus: 1.5 mg/dL — ABNORMAL LOW (ref 2.5–4.6)
Potassium: 3.9 mmol/L (ref 3.5–5.1)
Sodium: 135 mmol/L (ref 135–145)

## 2015-01-16 LAB — CULTURE, RESPIRATORY

## 2015-01-16 LAB — BASIC METABOLIC PANEL
Anion gap: 8 (ref 5–15)
BUN: 5 mg/dL — AB (ref 6–20)
CALCIUM: 7.6 mg/dL — AB (ref 8.9–10.3)
CO2: 27 mmol/L (ref 22–32)
CREATININE: 0.86 mg/dL (ref 0.44–1.00)
Chloride: 101 mmol/L (ref 101–111)
GFR calc Af Amer: >60 mL/min (ref >60)
GLUCOSE: 135 mg/dL — AB (ref 65–99)
Potassium: 3.8 mmol/L (ref 3.5–5.1)
SODIUM: 136 mmol/L (ref 135–145)

## 2015-01-16 LAB — MAGNESIUM
MAGNESIUM: 2.5 mg/dL — AB (ref 1.7–2.4)
MAGNESIUM: 2.3 mg/dL (ref 1.7–2.4)
Magnesium: 2.3 mg/dL (ref 1.7–2.4)

## 2015-01-16 LAB — CULTURE, RESPIRATORY W GRAM STAIN: Gram Stain: NONE SEEN

## 2015-01-16 LAB — ALBUMIN: ALBUMIN: 3.4 g/dL — AB (ref 3.5–5.0)

## 2015-01-16 MED ORDER — ALBUTEROL SULFATE (2.5 MG/3ML) 0.083% IN NEBU
2.5000 mg | INHALATION_SOLUTION | Freq: Three times a day (TID) | RESPIRATORY_TRACT | Status: DC | PRN
  Administered 2015-01-16: 2.5 mg via RESPIRATORY_TRACT
  Filled 2015-01-16: qty 3

## 2015-01-16 MED ORDER — HEPARIN SODIUM (PORCINE) 1000 UNIT/ML DIALYSIS: 1000.0000 [IU] | INTRAMUSCULAR | Status: DC | PRN

## 2015-01-16 MED ORDER — ANTICOAGULANT SODIUM CITRATE 4% (200MG/5ML) IV SOLN
5.0000 mL | Status: DC | PRN
  Administered 2015-01-18: 3.2 mL via INTRAVENOUS
  Filled 2015-01-16 (×3): qty 250

## 2015-01-16 MED ORDER — PRISMASOL BGK 4/2.5 32-4-2.5 MEQ/L IV SOLN
INTRAVENOUS | Status: DC
  Administered 2015-01-16 (×2): via INTRAVENOUS_CENTRAL

## 2015-01-16 MED ORDER — SODIUM CHLORIDE 0.9 % FOR CRRT
INTRAVENOUS_CENTRAL | Status: DC | PRN
  Filled 2015-01-16 (×2): qty 1000

## 2015-01-16 MED ORDER — PRISMASOL BGK 4/2.5 32-4-2.5 MEQ/L IV SOLN
INTRAVENOUS | Status: DC
  Administered 2015-01-16 (×4): via INTRAVENOUS_CENTRAL

## 2015-01-16 MED ORDER — STERILE WATER FOR INJECTION IV SOLN
INTRAVENOUS | Status: DC
  Administered 2015-01-16 – 2015-01-17 (×3): via INTRAVENOUS_CENTRAL
  Filled 2015-01-16 (×7): qty 150

## 2015-01-16 MED ORDER — STERILE WATER FOR INJECTION IV SOLN
INTRAVENOUS | Status: DC
  Administered 2015-01-16 – 2015-01-17 (×6): via INTRAVENOUS_CENTRAL
  Filled 2015-01-16 (×13): qty 150

## 2015-01-16 MED ORDER — PRISMASOL BGK 4/2.5 32-4-2.5 MEQ/L IV SOLN
INTRAVENOUS | Status: DC
  Administered 2015-01-16: 14:00:00 via INTRAVENOUS_CENTRAL

## 2015-01-16 MED ORDER — PRISMASOL BGK 4/2.5 32-4-2.5 MEQ/L IV SOLN
INTRAVENOUS | Status: DC
  Administered 2015-01-16 – 2015-01-18 (×7): via INTRAVENOUS_CENTRAL
  Filled 2015-01-16 (×13): qty 5000

## 2015-01-16 NOTE — Progress Notes (Signed)
Subjective:  Extubated - no complaints- not making any urine Objective Vital signs in last 24 hours: Filed Vitals:   01/16/15 0400 01/16/15 0500 01/16/15 0600 01/16/15 0700  BP: 121/60 143/59 146/60 140/63  Pulse: 84 79 85 86  Temp: 96.5 F (35.8 C) 95.9 F (35.5 C) 95.9 F (35.5 C) 96.5 F (35.8 C)  TempSrc: Core (Comment)  Core (Comment)   Resp: Weight:  57.5 kg (126 lb 12.2 oz)    SpO2: 98% 97% 97% 98%   Weight change: 6.697 kg (14 lb 12.2 oz)  Intake/Output Summary (Last 24 hours) at 01/16/15 0756 Last data filed at 01/16/15 0700  Gross per 24 hour  Intake 1039.78 ml  Output   1197 ml  Net -157.22 ml    Assessment/ Plan: Pt is a 64 y.o. yo female who was admitted on 01/14/2015 with sepsis of unknown etiology after renal biopsy for diagnostic purposes  Assessment/Plan: 1. Renal- CKD as OP- crt climbing to over 3- s/p renal bx 9/1 for diagnostic purposes.  Now with A on CRF in the setting of presumed sepsis but of unknown source requiring CRRT since 9/3.  No UOP but clinically much improved with good numbers so will stop CRRT and follow labs and UOP  2. sepsis- no positive cultures- vanc and zosyn 3. Anemia- low but stable- on ESA as OP- no hematoma- cont to monitor 4. Secondary hyperparathyroidism- last PTH 102- no vit D 5. HTN/volume- hemodynamically stable- no meds- running even with CRRT  6. VDRF- extubated now  Latrelle Bazar A    Labs: Basic Metabolic Panel:  Recent Labs Lab 01/15/15 0507  01/15/15 1734 01/15/15 2053 01/16/15 0420  NA 136  134*  --  137  --  136  K 3.7  3.6  --  3.4*  --  3.8  CL 94*  92*  --  101  --  101  CO2 12*  12*  --  29  --  27  GLUCOSE 377*  384*  --  116*  --  135*  BUN 66*  66*  --  18  --  5*  CREATININE 4.34*  4.32*  --  1.54*  --  0.86  CALCIUM 7.7*  7.6*  --  7.4*  --  7.6*  PHOS 5.9*  5.9*  < > 1.8* 1.8* 2.8  < > = values in this interval not displayed. Liver Function Tests:  Recent  Labs Lab 01/14/15 1445 01/14/15 1806 01/15/15 0507 01/15/15 1734 01/16/15 0420  AST 38 56*  --   --   --   ALT 18 27  --   --   --   ALKPHOS 43 42  --   --   --   BILITOT 0.8 1.0  --   --   --   PROT 6.2* 5.7*  --   --   --   ALBUMIN 3.8 3.3* 3.3* 3.3* 3.4*   No results for input(s): LIPASE, AMYLASE in the last 168 hours. No results for input(s): AMMONIA in the last 168 hours. CBC:  Recent Labs Lab 01/12/15 0854 01/14/15 1445  01/14/15 1806 01/15/15 0507 01/16/15 0420  WBC 6.0 4.7  --  5.1 8.7 9.9  NEUTROABS  --  4.2  --   --   --   --   HGB 9.4* 8.7*  < > 8.4* 7.9* 8.0*  HCT 28.8* 27.5*  < > 27.0* 24.1* 24.2*  MCV 90.3 96.8  --  95.7  90.6 89.3  PLT 150 142*  --  159 114* 88*  < > = values in this interval not displayed. Cardiac Enzymes:  Recent Labs Lab 01/14/15 1806  TROPONINI <0.03   CBG:  Recent Labs Lab 01/15/15 1151 01/15/15 1625 01/15/15 2009 01/16/15 0013 01/16/15 0403  GLUCAP 199* 103* 108* 162* 128*    Iron Studies: No results for input(s): IRON, TIBC, TRANSFERRIN, FERRITIN in the last 72 hours. Studies/Results: US Renal  01/14/2015   CLINICAL DATA:  Renal failure. History of hypertension, COPD, diabetes.  EXAM: RENAL / URINARY TRACT ULTRASOUND COMPLETE  COMPARISON:  10/21/2014  FINDINGS: Right Kidney:  Length: 9.9 cm. Renal parenchyma is echogenic. Multiple small cysts are identified. The largest is seen in the lower pole region measuring 1.1 cm. No hydronephrosis.  Left Kidney:  Length: 10.2 cm. Echogenic renal parenchyma. Multiple small cysts are identified, largest in the lower pole region which measures 1.3 cm.  Bladder:  A Foley catheter decompresses the bladder.  Additional:  Note is made of a small amount of ascites.  IMPRESSION: 1. Echogenic kidneys bilaterally.  Bilateral renal cysts. 2. No hydronephrosis. 3. Small amount of ascites noted.   Electronically Signed   By: Norva Pavlov M.D.   On: 01/14/2015 17:44   Dg Chest Port 1  View  01/15/2015   CLINICAL DATA:  Septic shock, respiratory failure and acute renal failure. Question left lower lobe pneumonia.  EXAM: PORTABLE CHEST - 1 VIEW  COMPARISON:  01/14/2015  FINDINGS: Endotracheal tube with tip 3.2 cm above the carina, left IJ central venous catheter with tip overlying the superior cavoatrial junction NG tube entering the stomach with tip off the field of view noted.  Left lower lung opacity has improved.  There is no evidence of pleural effusion or pneumothorax.  IMPRESSION: Improved left lower lung opacity, suggesting atelectasis.  No other significant change.   Electronically Signed   By: Harmon Pier M.D.   On: 01/15/2015 07:32   Dg Chest Port 1 View  01/14/2015   CLINICAL DATA:  Septic shock, respiratory failure and acute renal failure. Endotracheal tube and central line placement.  EXAM: PORTABLE CHEST - 1 VIEW  COMPARISON:  01/14/2015 and prior radiographs  FINDINGS: The cardiomediastinal silhouette is unchanged.  An endotracheal tube with tip 5.1 cm above the carina, left IJ central venous catheter with tip overlying the lower SVC and NG tube entering the stomach with tip off the field of view are noted.  Left lower lung airspace disease is present.  There is no evidence of pneumothorax or pleural effusion.  IMPRESSION: Left lower lung airspace disease/ pneumonia.  Support apparatus as described.  No evidence of pneumothorax.   Electronically Signed   By: Harmon Pier M.D.   On: 01/14/2015 16:38   Dg Chest Portable 1 View  01/14/2015   CLINICAL DATA:  Sepsis.  EXAM: PORTABLE CHEST - 1 VIEW  COMPARISON:  03/25/2014.  FINDINGS: Interval borderline enlargement of the cardiac silhouette and mild increase in prominence of the interstitial markings, accentuated by a decreased inspiration. Calcified granuloma in the right lower lung zone. No pleural fluid. Diffuse osteopenia.  IMPRESSION: Interval borderline cardiomegaly and mildly increased prominence of the interstitial markings,  most likely due to a poor inspiration. No gross acute abnormality.   Electronically Signed   By: Beckie Salts M.D.   On: 01/14/2015 15:21   Medications: Infusions: . dialysis replacement fluid (prismasate) 1,500 mL/hr at 01/16/15 0641  . dialysis replacement fluid (prismasate) 1,500  mL/hr at 01/16/15 0641  . dialysate (PRISMASATE) 2,000 mL/hr at 01/16/15 0411    Scheduled Medications: . insulin aspart  0-20 Units Subcutaneous 6 times per day  . pantoprazole (PROTONIX) IV  40 mg Intravenous Q24H  . piperacillin-tazobactam (ZOSYN)  IV  2.25 g Intravenous 4 times per day  . vancomycin  750 mg Intravenous Q24H    have reviewed scheduled and prn medications.  Physical Exam: General: she says "how are you ?" Heart: RRR Lungs: mostly clear Abdomen: soft, non tender Extremities: minimal edema  Dialysis Access: femoral vascath placed 9/3    01/16/2015,7:56 AM  LOS: 2 days

## 2015-01-16 NOTE — Progress Notes (Signed)
PULMONARY / CRITICAL CARE MEDICINE   Name: Pamela [name] MRN: [id number] DOB: 08/01/1950    ADMISSION DATE:  01/14/2015 CONSULTATION DATE:  01/14/2015  REFERRING MD :  EDP  CHIEF COMPLAINT:  Acute renal failure, septic shock and respiratory failure.  INITIAL PRESENTATION: 64 year old female with PMH of stage 3 kidney disease who had a biopsy done 9/1 of her left kidney.  For 24 hours prior to presentation continued to deteriorate from a mental status standpoint but LOC on 9/3 and was brought to the ED.  In the ED patient was noted to be in renal failure, hyperkalemic, acidotic and in respiratory failure due to AMS.  Spoke with husband  Who informed me patient would not want to be life support but when informed that this is reversible decision was made to change her to a LCB with no CPR and no cardioversion.  STUDIES:    SIGNIFICANT EVENTS: 9/3 intubated and renal consult called.   HISTORY OF PRESENT ILLNESS:  64 year old female with PMH of stage 3 kidney disease who had a biopsy done 9/1 of her left kidney.  For 24 hours prior to presentation continued to deteriorate from a mental status standpoint but LOC on 9/3 and was brought to the ED.  In the ED patient was noted to be in renal failure, hyperkalemic, acidotic and in respiratory failure due to AMS.  Spoke with husband  Who informed me patient would not want to be life support but when informed that this is reversible decision was made to change her to a LCB with no CPR and no cardioversion.  SUBJECTIVE: Alert and interactive, moving all ext to commands but paradoxically breathing this AM.  VITAL SIGNS: Temp:  [95.9 F (35.5 C)-97 F (36.1 C)] 96.5 F (35.8 C) (09/05 0700) Pulse Rate:  [76-100] 86 (09/05 0700) Resp:  [17-31] 30 (09/05 0700) BP: (118-146)/(49-76) 140/63 mmHg (09/05 0700) SpO2:  [96 %-100 %] 98 % (09/05 0700) Arterial Line BP: (148-173)/(44-55) 162/52 mmHg (09/05 0700) Weight:  [57.5 kg (126 lb 12.2 oz)] 57.5 kg  (126 lb 12.2 oz) (09/05 0500) HEMODYNAMICS: CVP:  [12 mmHg-13 mmHg] 13 mmHg VENTILATOR SETTINGS:   INTAKE / OUTPUT:  Intake/Output Summary (Last 24 hours) at 01/16/15 0857 Last data filed at 01/16/15 0700  Gross per 24 hour  Intake 1014.01 ml  Output   1009 ml  Net   5.01 ml   PHYSICAL EXAMINATION: General:  Chronically ill appearing female, alert and interactive, moving all ext to command. Neuro:  Moves all ext to command. HEENT:  [place]/AT, PERRL, EOM-I and MMM. Cardiovascular:  RRR, Nl S1/S2, -M/R/G. Lungs:  Diffuse crackles on exam. Abdomen:  Soft, NT, ND and +BS. Musculoskeletal:  1+ edema and -tenderness. Skin:  Intact.  LABS:  CBC  Recent Labs Lab 01/14/15 1806 01/15/15 0507 01/16/15 0420  WBC 5.1 8.7 9.9  HGB 8.4* 7.9* 8.0*  HCT 27.0* 24.1* 24.2*  PLT 159 114* 88*   Coag's  Recent Labs Lab 01/12/15 0854 01/14/15 1806  APTT 30 35  INR 1.10 1.92*   BMET  Recent Labs Lab 01/15/15 0507 01/15/15 1734 01/16/15 0420  NA 136  134* 137 136  K 3.7  3.6 3.4* 3.8  CL 94*  92* 101 101  CO2 12*  12* 29 27  BUN 66*  66* 18 5*  CREATININE 4.34*  4.32* 1.54* 0.86  GLUCOSE 377*  384* 116* 135*   Electrolytes  Recent Labs Lab 01/15/15 0507 01/15/15 0934  01/15/15 1734 01/15/15 2053 01/16/15 0420  CALCIUM 7.7*  7.6*  --  7.4*  --  7.6*  MG 1.8 1.9  --  2.4 2.3  PHOS 5.9*  5.9* 2.5 1.8* 1.8* 2.8   Sepsis Markers  Recent Labs Lab 01/14/15 1910 01/15/15 0024 01/15/15 0433  LATICACIDVEN 16.5* >17.00* 11.81*   ABG  Recent Labs Lab 01/14/15 2018 01/14/15 2037 01/15/15 0400  PHART 7.003* 7.000* 7.182*  PCO2ART 32.5* 28.8* 25.9*  PO2ART 267.0* 170.0* 182*   Liver Enzymes  Recent Labs Lab 01/14/15 1445 01/14/15 1806 01/15/15 0507 01/15/15 1734 01/16/15 0420  AST 38 56*  --   --   --   ALT 18 27  --   --   --   ALKPHOS 43 42  --   --   --   BILITOT 0.8 1.0  --   --   --   ALBUMIN 3.8 3.3* 3.3* 3.3* 3.4*   Cardiac  Enzymes  Recent Labs Lab 01/14/15 1806  TROPONINI <0.03   Glucose  Recent Labs Lab 01/15/15 1151 01/15/15 1625 01/15/15 2009 01/16/15 0013 01/16/15 0403 01/16/15 0739  GLUCAP 199* 103* 108* 162* 128* 120*    Imaging No results found. ASSESSMENT / PLAN:  PULMONARY OETT 9/3>>>9/4 A: Paradoxical breathing this AM and crackles on exam. P:   - Volume off if able to take one liter off via CRRT prior to d/c that would be helpful, plenty of BP available. - Titrate O2 for sat of 88-92%. - IS per RT protocol.  CARDIOVASCULAR CVL L IJ TLC 9/3>>> L radial a-line 9/3>>> A: Septic shock from presumed UTI. P:  - Sepsis protocol complete. - KVO IVF. - D/C Bicarb drip. - Tele monitoring. - D/C pressors. - If BP remains elevated after volume off will add norvasc, current CVP is 16.  RENAL A:  Acute on chronic renal failure. Acidosis Resolved. Hyperkalemia resolved. P:   - D/C Kayexalate. - D/C Bicarb drip. - Renal input appreciated. - U/S of the kidney negative for hematoma. - D/C CRRT today after a liter off volume wise then transition to standard HD.  GASTROINTESTINAL A:  No active issues. P:   - Renal diet if passes swallow evaluation. - Swallow eval today, had trouble with RN bedside assessment. - PPI.  HEMATOLOGIC A:  No active issues. P:  - CBC in AM. - Transfuse per ICU protocol.  INFECTIOUS A:  Likely UTI. P:   BCx2  9/3>>>NTD UC  9/3>>>NTD Sputum 9/3>>>ENTEROBACTER CLOACAE   Abx:  Vanc 9/3>>> Zosyn 9/3>>>9/5  ENDOCRINE A:  No DM history.   P:   - Monitor. - Cortisol noted 27.8. - D/C stress dose steroids.  NEUROLOGIC A:  Metabolic encephalopathy. P:   - D/C Versed - D/C Fentanyl  FAMILY  - Updates: Spoke with husband and patient, LCB with no CPR/cardioversion/trach/peg, short term intubation only.  Will hold in the ICU given paradoxical breathing and continue volume negative for now to assist.  No need for intubation right now but  will make BiPAP available for work of breathing.  The patient is critically ill with multiple organ systems failure and requires high complexity decision making for assessment and support, frequent evaluation and titration of therapies, application of advanced monitoring technologies and extensive interpretation of multiple databases.   Critical Care Time devoted to patient care services described in this note is  35  Minutes. This time reflects time of care of this signee Dr Koren Bound. This critical care time  does not reflect procedure time, or teaching time or supervisory time of PA/NP/Med student/Med Resident etc but could involve care discussion time.  Alyson Reedy, M.D. [facility] Pulmonary/Critical Care Medicine. Pager: [phone]. After hours pager: [phone].  01/16/2015, 8:57 AM

## 2015-01-16 NOTE — Progress Notes (Signed)
PT Cancellation Note  Patient Details Name: Pamela [name] MRN: [id number] DOB: 1950/11/24   Cancelled Treatment:    Reason Eval/Treat Not Completed: Medical issues which prohibited therapy. Discussed pt case with RN who reports that pt is not appropriate for PT today. Pt is currently on continuous dialysis in room through a femoral catheter. RN anticipates that pt will be on dialysis until late this afternoon. Will hold PT eval at this time and check back as schedule allows for medical readiness to participate.   Conni Slipper 01/16/2015, 11:05 AM   Conni Slipper, PT, DPT Acute Rehabilitation Services Pager: [phone]

## 2015-01-16 NOTE — Progress Notes (Signed)
Initial Nutrition Assessment  DOCUMENTATION CODES:   Not applicable  INTERVENTION:    Diet advancement per SLP; RD to add PO supplements as needed for poor oral intake.  NUTRITION DIAGNOSIS:   Inadequate oral intake related to acute illness as evidenced by NPO status.  GOAL:   Patient will meet greater than or equal to 90% of their needs  MONITOR:   Diet advancement, PO intake, Labs, Weight trends  REASON FOR ASSESSMENT:   Consult Enteral/tube feeding initiation and management  ASSESSMENT:   Patient admitted on 9/3 with acute renal failure, septic shock and respiratory failure. Required intubation ob 9/3 and was extubated on 9/4.  Patient was intubated for a short time, TF was never initiated. S/P swallow evaluation with SLP this AM. Per RN, patient able to eat, SLP to put in diet order, but RN not planning to give much po's because patient may require re-intubation.   Diet Order:  Diet renal with fluid restriction Fluid restriction:: 1200 mL Fluid; Room service appropriate?: Yes; Fluid consistency:: Thin  Skin:  Reviewed, no issues  Last BM:  unknown  Height:   Ht Readings from Last 1 Encounters:  01/12/15 5\' 1"  (1.549 m)    Weight:   Wt Readings from Last 1 Encounters:  01/16/15 126 lb 12.2 oz (57.5 kg)    Ideal Body Weight:  47.7 kg  BMI:  Body mass index is 23.96 kg/(m^2).  Estimated Nutritional Needs:   Kcal:  1700-2000  Protein:  70-85 gm  Fluid:  1.2 L  EDUCATION NEEDS:   No education needs identified at this time  Joaquin Courts, RD, LDN, CNSC Pager [phone] After Hours Pager [phone]

## 2015-01-16 NOTE — Progress Notes (Signed)
I had planned to stop the CRRT today but due to  Issues related to both her volume status and her acidosis- will continue for now- have discussed with Dr. Georga Hacking A

## 2015-01-16 NOTE — Evaluation (Signed)
Clinical/Bedside Swallow Evaluation Patient Details  Name: Pamela [name] MRN: [id number] Date of Birth: Jan 05, 1951  Today's Date: 01/16/2015 Time: SLP Start Time (ACUTE ONLY): 0930 SLP Stop Time (ACUTE ONLY): 0950 SLP Time Calculation (min) (ACUTE ONLY): 20 min  Past Medical History:  Past Medical History  Diagnosis Date  . PONV (postoperative nausea and vomiting)   . Hypertension   . COPD (chronic obstructive pulmonary disease)   . Diabetes mellitus without complication 10-29-12  . Coronary artery disease   . Shortness of breath dyspnea   . Anxiety   . Chronic kidney disease    Past Surgical History:  Past Surgical History  Procedure Laterality Date  . Abdominal hysterectomy    . Elbow surgery Right     tendon surgery  . Tubal ligation    . Breast surgery      cyst removed  . Cholecystectomy      '74-open  . Appendectomy      '74- open with gallbladder  . Cardiac catheterization      1 coronary stent placed  . Ganglion cyst excision Bilateral 10-29-12    wrist  . Anal fissure repair    . Colonoscopy with propofol N/A 11/17/2012    Procedure: COLONOSCOPY WITH PROPOFOL;  Surgeon: Charolett Bumpers, MD;  Location: [facility] ENDOSCOPY;  Service: Endoscopy;  Laterality: N/A;  . Esophagogastroduodenoscopy (egd) with propofol N/A 11/17/2012    Procedure: ESOPHAGOGASTRODUODENOSCOPY (EGD) WITH PROPOFOL;  Surgeon: Charolett Bumpers, MD;  Location: [facility] ENDOSCOPY;  Service: Endoscopy;  Laterality: N/A;  . Left heart catheterization with coronary angiogram N/A 04/14/2014    Procedure: LEFT HEART CATHETERIZATION WITH CORONARY ANGIOGRAM;  Surgeon: Lesleigh Noe, MD;  Location: [facility] CATH LAB;  Service: Cardiovascular;  Laterality: N/A;   HPI:  Pt is a 64 year old female with PMH of stage 3 kidney disease, HTN, COPD, diabetes mellitus, and coronary artery disease. Pt had a biopsy on 9/1 for her left kidney and was admitted on 9/3 when she lost consciousness; prior to admission she had deteriorating  mental status. Due to AMS, respiratory failure, septic shock, and renal failure, pt was intubated. CXR showed no gross acute abnormalities but improved left lower lung opacity, suggesting atelectasis. Pt was extubated on 01/15/15 at 9am and currently on a renal diet.   Assessment / Plan / Recommendation Clinical Impression  Pt demonstrated a normal ability to swallow all textures tested. Pt did not show any signs or symptoms of aspiration. However, the pt. has a high risk of aspiration due to her complicated medical status (e.g. respiratory failure, stage 3 kidney disease, COPD, and coronary artery disease). During the evaluation the pt. displayed shortness of breath and change in respiratory rate. SLP communicated with the RN and recommends holding food until respiratory status improves, but she may give sips of water when the pt. requests it. Diet recommendations are dysphagia 3 (mech soft), thin liquids, feeding should occur with RN present to monitor vitals, pt. upright, small bites, and breaks if needed. SLP will f/u with diet check. Pt's prognosis for a safe swallow and ability to return to a normal diet is dependent on improvement of her overall complicated health.    Aspiration Risk  Severe    Diet Recommendation NPO   Medication Administration: Whole meds with liquid Compensations: Slow rate;Small sips/bites    Other  Recommendations Oral Care Recommendations: Oral care QID   Follow Up Recommendations       Frequency and Duration min 3x week  2 weeks   Pertinent Vitals/Pain NA    SLP Swallow Goals     Swallow Study Prior Functional Status       General Other Pertinent Information: Pt is a 64 year old female with PMH of stage 3 kidney disease, HTN, COPD, diabetes mellitus, and coronary artery disease. Pt had a biopsy on 9/1 for her left kidney and was admitted on 9/3 when she lost consciousness; prior to admission she had deteriorating mental status. Due to AMS, respiratory  failure, septic shock, and renal failure, pt was intubated. CXR showed no gross acute abnormalities but improved left lower lung opacity, suggesting atelectasis. Pt was extubated on 01/15/15 at 9am and currently on a renal diet. Type of Study: Bedside swallow evaluation Diet Prior to this Study: NPO Temperature Spikes Noted: No Respiratory Status: Supplemental O2 delivered via (comment) History of Recent Intubation: Yes Length of Intubations (days): 1 days Date extubated: 01/15/15 Behavior/Cognition: Alert;Cooperative Oral Cavity - Dentition: Edentulous Self-Feeding Abilities: Able to feed self;Needs assist Patient Positioning: Upright in bed Baseline Vocal Quality: Aphonic Volitional Cough: Congested    Oral/Motor/Sensory Function Overall Oral Motor/Sensory Function: Appears within functional limits for tasks assessed Labial ROM: Within Functional Limits Labial Symmetry: Within Functional Limits Labial Strength: Within Functional Limits Lingual Symmetry: Within Functional Limits Facial ROM: Within Functional Limits Facial Symmetry: Within Functional Limits   Ice Chips Ice chips: Within functional limits Presentation: Spoon   Thin Liquid Thin Liquid: Within functional limits Presentation: Cup;Straw;Self Fed (with assist)    Nectar Thick Nectar Thick Liquid: Not tested   Honey Thick Honey Thick Liquid: Not tested   Puree Puree: Impaired Presentation: Spoon Pharyngeal Phase Impairments: Cough - Delayed   Solid   GO    Solid: Within functional limits Presentation: Spoon Other Comments: dentures in with solids      Riccardo Dubin, Student-SLP   Riccardo Dubin 01/16/2015,10:47 AM

## 2015-01-17 ENCOUNTER — Inpatient Hospital Stay (HOSPITAL_COMMUNITY): Admission: RE | Admit: 2015-01-17 | Payer: Medicare Other | Source: Ambulatory Visit

## 2015-01-17 ENCOUNTER — Inpatient Hospital Stay (HOSPITAL_COMMUNITY): Payer: Medicare Other

## 2015-01-17 ENCOUNTER — Encounter (HOSPITAL_COMMUNITY): Payer: Medicare Other

## 2015-01-17 DIAGNOSIS — J441 Chronic obstructive pulmonary disease with (acute) exacerbation: Secondary | ICD-10-CM | POA: Insufficient documentation

## 2015-01-17 DIAGNOSIS — J449 Chronic obstructive pulmonary disease, unspecified: Secondary | ICD-10-CM | POA: Insufficient documentation

## 2015-01-17 DIAGNOSIS — R609 Edema, unspecified: Secondary | ICD-10-CM

## 2015-01-17 LAB — POCT I-STAT 3, ART BLOOD GAS (G3+)
ACID-BASE EXCESS: 22 mmol/L — AB (ref 0.0–2.0)
Acid-Base Excess: 8 mmol/L — ABNORMAL HIGH (ref 0.0–2.0)
Bicarbonate: 35.9 mEq/L — ABNORMAL HIGH (ref 20.0–24.0)
Bicarbonate: 49.5 mEq/L — ABNORMAL HIGH (ref 20.0–24.0)
O2 SAT: 86 %
O2 SAT: 100 %
PH ART: 7.274 — AB (ref 7.350–7.450)
PH ART: 7.403 (ref 7.350–7.450)
PO2 ART: 372 mmHg — AB (ref 80.0–100.0)
Patient temperature: 98.3
TCO2: 38 mmol/L (ref 0–100)
pCO2 arterial: 77.6 mmHg (ref 35.0–45.0)
pCO2 arterial: 79.2 mmHg (ref 35.0–45.0)
pO2, Arterial: 61 mmHg — ABNORMAL LOW (ref 80.0–100.0)

## 2015-01-17 LAB — CBC
HCT: 26.7 % — ABNORMAL LOW (ref 36.0–46.0)
Hemoglobin: 8.6 g/dL — ABNORMAL LOW (ref 12.0–15.0)
MCH: 29.7 pg (ref 26.0–34.0)
MCHC: 32.2 g/dL (ref 30.0–36.0)
MCV: 92.1 fL (ref 78.0–100.0)
PLATELETS: 103 10*3/uL — AB (ref 150–400)
RBC: 2.9 MIL/uL — ABNORMAL LOW (ref 3.87–5.11)
RDW: 16.5 % — AB (ref 11.5–15.5)
WBC: 10.7 10*3/uL — ABNORMAL HIGH (ref 4.0–10.5)

## 2015-01-17 LAB — PROTIME-INR
INR: 1.23 (ref 0.00–1.49)
Prothrombin Time: 15.7 seconds — ABNORMAL HIGH (ref 11.6–15.2)

## 2015-01-17 LAB — BASIC METABOLIC PANEL
Anion gap: 9 (ref 5–15)
CALCIUM: 7.3 mg/dL — AB (ref 8.9–10.3)
CO2: 39 mmol/L — ABNORMAL HIGH (ref 22–32)
CREATININE: 1.1 mg/dL — AB (ref 0.44–1.00)
Chloride: 88 mmol/L — ABNORMAL LOW (ref 101–111)
GFR calc Af Amer: >60 mL/min (ref >60)
GFR, EST NON AFRICAN AMERICAN: 52 mL/min — AB (ref >60)
GLUCOSE: 118 mg/dL — AB (ref 65–99)
Potassium: 3.5 mmol/L (ref 3.5–5.1)
Sodium: 136 mmol/L (ref 135–145)

## 2015-01-17 LAB — MAGNESIUM
Magnesium: 2.3 mg/dL (ref 1.7–2.4)
Magnesium: 2.1 mg/dL (ref 1.7–2.4)
Magnesium: 2.2 mg/dL (ref 1.7–2.4)

## 2015-01-17 LAB — GLUCOSE, CAPILLARY
GLUCOSE-CAPILLARY: 137 mg/dL — AB (ref 65–99)
Glucose-Capillary: 114 mg/dL — ABNORMAL HIGH (ref 65–99)
Glucose-Capillary: 162 mg/dL — ABNORMAL HIGH (ref 65–99)
Glucose-Capillary: 130 mg/dL — ABNORMAL HIGH (ref 65–99)
Glucose-Capillary: 103 mg/dL — ABNORMAL HIGH (ref 65–99)

## 2015-01-17 LAB — RENAL FUNCTION PANEL
ALBUMIN: 3.4 g/dL — AB (ref 3.5–5.0)
ANION GAP: 15 (ref 5–15)
BUN: 6 mg/dL (ref 6–20)
CALCIUM: 7.5 mg/dL — AB (ref 8.9–10.3)
CO2: 32 mmol/L (ref 22–32)
Chloride: 89 mmol/L — ABNORMAL LOW (ref 101–111)
Creatinine, Ser: 1.34 mg/dL — ABNORMAL HIGH (ref 0.44–1.00)
GFR, EST AFRICAN AMERICAN: 47 mL/min — AB (ref >60)
GFR, EST NON AFRICAN AMERICAN: 41 mL/min — AB (ref >60)
GLUCOSE: 138 mg/dL — AB (ref 65–99)
PHOSPHORUS: 2.3 mg/dL — AB (ref 2.5–4.6)
Potassium: 3.5 mmol/L (ref 3.5–5.1)
SODIUM: 136 mmol/L (ref 135–145)

## 2015-01-17 LAB — TRIGLYCERIDES: Triglycerides: 54 mg/dL (ref <150)

## 2015-01-17 LAB — PHOSPHORUS
PHOSPHORUS: 4.3 mg/dL (ref 2.5–4.6)
Phosphorus: 2.1 mg/dL — ABNORMAL LOW (ref 2.5–4.6)
Phosphorus: 2 mg/dL — ABNORMAL LOW (ref 2.5–4.6)

## 2015-01-17 LAB — CG4 I-STAT (LACTIC ACID): Lactic Acid, Venous: 0.56 mmol/L (ref 0.5–2.0)

## 2015-01-17 LAB — ALBUMIN: Albumin: 3.6 g/dL (ref 3.5–5.0)

## 2015-01-17 LAB — URINE CULTURE: Culture: NO GROWTH

## 2015-01-17 LAB — TSH: TSH: 1.486 u[IU]/mL (ref 0.350–4.500)

## 2015-01-17 MED ORDER — ANTISEPTIC ORAL RINSE SOLUTION (CORINZ)
7.0000 mL | Freq: Four times a day (QID) | OROMUCOSAL | Status: DC
  Administered 2015-01-17 – 2015-01-20 (×12): 7 mL via OROMUCOSAL

## 2015-01-17 MED ORDER — CHLORHEXIDINE GLUCONATE 0.12% ORAL RINSE (MEDLINE KIT)
15.0000 mL | Freq: Two times a day (BID) | OROMUCOSAL | Status: DC
  Administered 2015-01-17 – 2015-01-20 (×6): 15 mL via OROMUCOSAL

## 2015-01-17 MED ORDER — PRISMASOL BGK 4/2.5 32-4-2.5 MEQ/L IV SOLN
INTRAVENOUS | Status: DC
  Administered 2015-01-17 – 2015-01-18 (×2): via INTRAVENOUS_CENTRAL
  Filled 2015-01-17 (×3): qty 5000

## 2015-01-17 MED ORDER — DEXTROSE 5 % IV SOLN
1.0000 g | INTRAVENOUS | Status: AC
  Administered 2015-01-17 – 2015-01-21 (×5): 1 g via INTRAVENOUS
  Filled 2015-01-17 (×5): qty 10

## 2015-01-17 MED ORDER — VITAL HIGH PROTEIN PO LIQD
1000.0000 mL | ORAL | Status: DC
  Administered 2015-01-17: 1000 mL
  Administered 2015-01-18: 50 mL
  Administered 2015-01-18: 1000 mL
  Filled 2015-01-17 (×5): qty 1000

## 2015-01-17 MED ORDER — PRO-STAT SUGAR FREE PO LIQD
30.0000 mL | Freq: Two times a day (BID) | ORAL | Status: AC
  Administered 2015-01-17 (×2): 30 mL
  Filled 2015-01-17 (×2): qty 30

## 2015-01-17 MED ORDER — SODIUM PHOSPHATE 3 MMOLE/ML IV SOLN
30.0000 mmol | Freq: Once | INTRAVENOUS | Status: AC
  Administered 2015-01-17: 30 mmol via INTRAVENOUS
  Filled 2015-01-17: qty 10

## 2015-01-17 MED ORDER — PROPOFOL 1000 MG/100ML IV EMUL
5.0000 ug/kg/min | INTRAVENOUS | Status: DC
  Administered 2015-01-17: 25 ug/kg/min via INTRAVENOUS
  Administered 2015-01-17: 30 ug/kg/min via INTRAVENOUS
  Filled 2015-01-17 (×3): qty 100

## 2015-01-17 MED ORDER — VITAL HIGH PROTEIN PO LIQD
1000.0000 mL | ORAL | Status: DC
  Filled 2015-01-17 (×2): qty 1000

## 2015-01-17 MED ORDER — HEPARIN SODIUM (PORCINE) 1000 UNIT/ML DIALYSIS: 1000.0000 [IU] | INTRAMUSCULAR | Status: DC | PRN

## 2015-01-17 MED ORDER — FUROSEMIDE 10 MG/ML IJ SOLN
160.0000 mg | Freq: Once | INTRAVENOUS | Status: AC
  Administered 2015-01-17: 160 mg via INTRAVENOUS
  Filled 2015-01-17: qty 16

## 2015-01-17 MED ORDER — PRISMASOL BGK 4/2.5 32-4-2.5 MEQ/L IV SOLN
INTRAVENOUS | Status: DC
Start: 2015-01-17 — End: 2015-01-18
  Administered 2015-01-17: 11:00:00 via INTRAVENOUS_CENTRAL
  Filled 2015-01-17 (×3): qty 5000

## 2015-01-17 MED ORDER — IPRATROPIUM-ALBUTEROL 0.5-2.5 (3) MG/3ML IN SOLN
3.0000 mL | Freq: Four times a day (QID) | RESPIRATORY_TRACT | Status: DC
  Administered 2015-01-17 – 2015-01-19 (×11): 3 mL via RESPIRATORY_TRACT
  Filled 2015-01-17 (×11): qty 3

## 2015-01-17 MED ORDER — METHYLPREDNISOLONE SODIUM SUCC 125 MG IJ SOLR
60.0000 mg | Freq: Three times a day (TID) | INTRAMUSCULAR | Status: DC
  Administered 2015-01-17 – 2015-01-18 (×4): 60 mg via INTRAVENOUS
  Filled 2015-01-17: qty 0.96
  Filled 2015-01-17 (×2): qty 2
  Filled 2015-01-17 (×2): qty 0.96
  Filled 2015-01-17: qty 2

## 2015-01-17 MED ORDER — AMLODIPINE BESYLATE 5 MG PO TABS
5.0000 mg | ORAL_TABLET | Freq: Every day | ORAL | Status: DC
  Filled 2015-01-17 (×2): qty 1

## 2015-01-17 MED ORDER — FENTANYL CITRATE (PF) 100 MCG/2ML IJ SOLN: 100.0000 ug | Freq: Once | INTRAMUSCULAR | Status: AC

## 2015-01-17 MED ORDER — FENTANYL CITRATE (PF) 100 MCG/2ML IJ SOLN
INTRAMUSCULAR | Status: AC
  Administered 2015-01-17: 100 ug
  Filled 2015-01-17: qty 2

## 2015-01-17 NOTE — Progress Notes (Signed)
SLP Cancellation Note  Patient Details Name: Pamela [name] MRN: [id number] DOB: May 11, 1951   Cancelled treatment:       Reason Eval/Treat Not Completed: Medical issues which prohibited therapy. Pt intubated, sign off   Jearline Hirschhorn, Riley Nearing 01/17/2015, 10:46 AM

## 2015-01-17 NOTE — Progress Notes (Signed)
Subjective:  Had continued CRRT yest due to volume and acidosis- also making Pamela [name] little urine- 15 per hour- ended 2 liters out with CRRT Objective Vital signs in last 24 hours: Filed Vitals:   01/17/15 0400 01/17/15 0500 01/17/15 0600 01/17/15 0700  BP: 150/75 145/67 144/77 149/65  Pulse: 95 35 96 96  Temp: 98.4 F (36.9 C) 98.2 F (36.8 C) 98.1 F (36.7 C) 97.9 F (36.6 C)  TempSrc:      Resp: 31 34 33 31  Weight:  56.6 kg (124 lb 12.5 oz)    SpO2: 93% 92% 92% 95%   Weight change: -0.9 kg (-1 lb 15.7 oz)  Intake/Output Summary (Last 24 hours) at 01/17/15 0737 Last data filed at 01/17/15 0701  Gross per 24 hour  Intake    516 ml  Output   2858 ml  Net  -2342 ml    Assessment/ Plan: Pt is Pamela [name] 64 y.o. yo female who was admitted on 01/14/2015 with sepsis of unknown etiology after renal biopsy for diagnostic purposes  Assessment/Plan: 1. Renal- CKD as OP- crt climbing to over 3- s/p renal bx 9/1 for diagnostic purposes- prelim results just c/w hypertensive nephrosclerosis.  Now with Pamela [name] on CRF in the setting of presumed sepsis but of unknown source requiring CRRT since 9/3.  Min UOP - clinically much improved except for acidosis - see below.  Making Pamela [name] little urine- will challenge with lasix 2. sepsis- no positive cultures- vanc and zosyn 3. Anemia- low but stable- on ESA as OP- no hematoma- cont to monitor 4. Secondary hyperparathyroidism- last PTH 102- no vit D- low phos repleting 5. HTN/volume- some volume overload- Pamela [name] line pressure is high- will continue with UF for now - try lasix- also resume norvasc 6. VDRF- extubated now 7. Acidosis- seems mostly resp  with increased RR- now with CRRT bicarb up to 40 - not sure what endpoint is- try bipap ?    Pamela Pamela [name]    Labs: Basic Metabolic Panel:  Recent Labs Lab 01/16/15 0420  01/16/15 1630 01/16/15 2045 01/17/15 0418  NA 136  --  135  --  136  K 3.8  --  3.9  --  3.5  CL 101  --  103  --  88*  CO2 27  --  25  --  39*   GLUCOSE 135*  --  162*  --  118*  BUN 5*  --  <5*  --  <5*  CREATININE 0.86  --  0.78  --  1.10*  CALCIUM 7.6*  --  7.5*  --  7.3*  PHOS 2.8  < > 1.5* 1.8* 2.1*  < > = values in this interval not displayed. Liver Function Tests:  Recent Labs Lab 01/14/15 1445 01/14/15 1806  01/16/15 0420 01/16/15 1630 01/17/15 0418  AST 38 56*  --   --   --   --   ALT 18 27  --   --   --   --   ALKPHOS 43 42  --   --   --   --   BILITOT 0.8 1.0  --   --   --   --   PROT 6.2* 5.7*  --   --   --   --   ALBUMIN 3.8 3.3*  < > 3.4* 3.4* 3.6  < > = values in this interval not displayed. No results for input(s): LIPASE, AMYLASE in the last 168 hours. No results for input(s): AMMONIA in the  last 168 hours. CBC:  Recent Labs Lab 01/14/15 1445  01/14/15 1806 01/15/15 0507 01/16/15 0420 01/17/15 0418  WBC 4.7  --  5.1 8.7 9.9 10.7*  NEUTROABS 4.2  --   --   --   --   --   HGB 8.7*  < > 8.4* 7.9* 8.0* 8.6*  HCT 27.5*  < > 27.0* 24.1* 24.2* 26.7*  MCV 96.8  --  95.7 90.6 89.3 92.1  PLT 142*  --  159 114* 88* 103*  < > = values in this interval not displayed. Cardiac Enzymes:  Recent Labs Lab 01/14/15 1806  TROPONINI <0.03   CBG:  Recent Labs Lab 01/16/15 1137 01/16/15 1531 01/16/15 1932 01/16/15 2335 01/17/15 0344  GLUCAP 188* 193* 116* 144* 114*    Iron Studies: No results for input(s): IRON, TIBC, TRANSFERRIN, FERRITIN in the last 72 hours. Studies/Results: No results found. Medications: Infusions: . dialysate (PRISMASATE) 1,000 mL/hr at 01/17/15 0701  . sodium bicarbonate (isotonic) 1000 mL infusion 300 mL/hr at 01/17/15 0456  . sodium bicarbonate (isotonic) 1000 mL infusion 150 mL/hr at 01/17/15 0459    Scheduled Medications: . insulin aspart  0-20 Units Subcutaneous 6 times per day  . pantoprazole (PROTONIX) IV  40 mg Intravenous Q24H  . piperacillin-tazobactam (ZOSYN)  IV  2.25 g Intravenous 4 times per day  . sodium phosphate  Dextrose 5% IVPB  30 mmol Intravenous  Once    have reviewed scheduled and prn medications.  Physical Exam: General: sleeping- high RR Heart: RRR Lungs: mostly clear Abdomen: soft, non tender Extremities: pitting edema to dependent areas Dialysis Access: femoral vascath placed 9/3    01/17/2015,7:37 AM  LOS: 3 days

## 2015-01-17 NOTE — Procedures (Signed)
Intubation Procedure Note Pamela [name] [id number] [birth year]  Procedure: Intubation Indications: Respiratory insufficiency  Procedure Details Consent: Risks of procedure as well as the alternatives and risks of each were explained to the (patient/caregiver).  Consent for procedure obtained. Time Out: Verified patient identification, verified procedure, site/side was marked, verified correct patient position, special equipment/implants available, medications/allergies/relevent history reviewed, required imaging and test results available.  Performed  Maximum sterile technique was used including gloves, gown, hand hygiene and mask.  MAC and 3    Evaluation Hemodynamic Status: BP stable throughout; O2 sats: stable throughout Patient's Current Condition: stable Complications: No apparent complications Patient did tolerate procedure well. Chest X-ray ordered to verify placement.  CXR: pending.   Nelda Bucks. 01/17/2015  Removed teeth prior

## 2015-01-17 NOTE — Progress Notes (Signed)
Changes made per ICU physician based on ABG results.

## 2015-01-17 NOTE — Care Management Important Message (Signed)
Important Message  Patient Details  Name: Pamela [name] MRN: [id number] Date of Birth: August 14, 1950   Medicare Important Message Given:  [facility] notification given    Kyla Balzarine 01/17/2015, 11:44 AMImportant Message  Patient Details  Name: Pamela [name] MRN: [id number] Date of Birth: 04/23/1951   Medicare Important Message Given:  Yes-second notification given    Kyla Balzarine 01/17/2015, 11:44 AM

## 2015-01-17 NOTE — Progress Notes (Signed)
PT Cancellation Note  Patient Details Name: Pamela [name] MRN: [id number] DOB: [birth year]   Cancelled Treatment:    Reason Eval/Treat Not Completed: Medical issues which prohibited therapy. Pt still on CVVHD this morning with femoral access, and noted that pt was intubated this morning as well. Will continue to follow and complete PT eval when medically ready.    Conni Slipper 01/17/2015, 11:17 AM   Conni Slipper, PT, DPT Acute Rehabilitation Services Pager: [phone]

## 2015-01-17 NOTE — Progress Notes (Addendum)
Nutrition Follow-up / Consult  DOCUMENTATION CODES:   Not applicable  INTERVENTION:    Initiate TF via OGT with Vital High Protein at 25 ml/h and Prostat 30 ml BID on day 1; increase to goal rate of 50 ml/h (1200 ml per day) to provide 1200 kcals, 105 gm protein, 1003 ml free water daily.  NUTRITION DIAGNOSIS:   Inadequate oral intake related to acute illness as evidenced by NPO status.  Ongoing  GOAL:   Patient will meet greater than or equal to 90% of their needs  Unmet  MONITOR:   Diet advancement, PO intake, Labs, Weight trends  REASON FOR ASSESSMENT:   Consult Enteral/tube feeding initiation and management  ASSESSMENT:   Patient admitted on 9/3 with acute renal failure, septic shock and respiratory failure. Required intubation on 9/3 and was extubated on 9/4.  Patient required re-intubation on 9/6. Received MD Consult for TF initiation and management. Currently requiring CVVHD.   Patient is currently intubated on ventilator support MV: 9.7 L/min Temp (24hrs), Avg:98.2 F (36.8 C), Min:96.6 F (35.9 C), Max:99 F (37.2 C)  Propofol: 5.1 ml/hr providing 135 kcals per day.  Diet Order:  Diet NPO time specified  Skin:  Reviewed, no issues  Last BM:  unknown  Height:   Ht Readings from Last 1 Encounters:  01/12/15 5\' 1"  (1.549 m)    Weight:   Wt Readings from Last 1 Encounters:  01/17/15 124 lb 12.5 oz (56.6 kg)    Ideal Body Weight:  47.7 kg  BMI:  Body mass index is 23.59 kg/(m^2).  Estimated Nutritional Needs:   Kcal:  1317  Protein:  95-120 gm with CRRT  Fluid:  1.5 L  EDUCATION NEEDS:   No education needs identified at this time  Joaquin Courts, RD, LDN, CNSC Pager [phone] After Hours Pager [phone]

## 2015-01-17 NOTE — Progress Notes (Signed)
Wasted 20mg  of amidate in the sink. Not used during intubation. Pamela [name]. Witnessed.

## 2015-01-17 NOTE — Progress Notes (Signed)
Pharmacist Heart Failure Core Measure Documentation  Assessment: Pamela [name] has an EF documented as 35-40% on 03/26/2014 by ECHO.  Rationale: Heart failure patients with left ventricular systolic dysfunction (LVSD) and an EF < 40% should be prescribed an angiotensin converting enzyme inhibitor (ACEI) or angiotensin receptor blocker (ARB) at discharge unless a contraindication is documented in the medical record.  This patient is not currently on an ACEI or ARB for HF.  This note is being placed in the record in order to provide documentation that a contraindication to the use of these agents is present for this encounter.  ACE Inhibitor or Angiotensin Receptor Blocker is contraindicated (specify all that apply)  []   ACEI allergy AND ARB allergy []   Angioedema []   Moderate or severe aortic stenosis []   Hyperkalemia []   Hypotension []   Renal artery stenosis [x]   Worsening renal function, preexisting renal disease or dysfunction   Vinnie Level, PharmD., BCPS Clinical Pharmacist Pager [phone]

## 2015-01-17 NOTE — Progress Notes (Signed)
*  Preliminary Results* Bilateral lower extremity venous duplex completed. Study was technically limited due to patient position and edema. Visualized veins of bilateral lower extremities are negative for deep vein thrombosis. There is no evidence of Baker's cyst bilaterally.  01/17/2015  Gertie Fey, RVT, RDCS, RDMS

## 2015-01-17 NOTE — Progress Notes (Addendum)
PULMONARY / CRITICAL CARE MEDICINE   Name: Pamela [name] MRN: [id number] DOB: 1950/05/20    ADMISSION DATE:  01/14/2015 CONSULTATION DATE:  01/14/2015  REFERRING MD :  EDP  CHIEF COMPLAINT:  Acute renal failure, septic shock and respiratory failure.  INITIAL PRESENTATION: 64 year old female with PMH of stage 3 kidney disease who had a biopsy done 9/1 of her left kidney.  Being managed for renal failure, hyperkalemic, acidotic and in respiratory failure due to AMS. CVVVHD STUDIES:   SIGNIFICANT EVENTS: 9/3 intubated and renal consult called. 9/5- neg 2.2 liters, cvvhd  SUBJECTIVE: SOB  VITAL SIGNS: Temp:  [96.6 F (35.9 C)-99 F (37.2 C)] 97.9 F (36.6 C) (09/06 0800) Pulse Rate:  [35-104] 95 (09/06 0800) Resp:  [20-38] 30 (09/06 0800) BP: (131-152)/(56-85) 149/65 mmHg (09/06 0700) SpO2:  [90 %-98 %] 94 % (09/06 0800) Arterial Line BP: (167-197)/(49-66) 182/57 mmHg (09/06 0800) Weight:  [56.6 kg (124 lb 12.5 oz)] 56.6 kg (124 lb 12.5 oz) (09/06 0500) HEMODYNAMICS:   VENTILATOR SETTINGS:   INTAKE / OUTPUT:  Intake/Output Summary (Last 24 hours) at 01/17/15 0847 Last data filed at 01/17/15 0800  Gross per 24 hour  Intake    559 ml  Output   3000 ml  Net  -2441 ml   PHYSICAL EXAMINATION: General:  Chronically ill appearing female, alert and interactive, moving all ext to command, distress moderate, guppy like Neuro:  Moves all ext to command. HEENT:  [place]/AT, PERRL Cardiovascular:  RRR, Nl S1/S2, -M/R/G. Lungs:  Diffuse crackles, exp wheezing Abdomen:  Soft, NT, ND and +BS. Musculoskeletal:  1+ edema and -tenderness. Skin:  Intact.  LABS:  CBC  Recent Labs Lab 01/15/15 0507 01/16/15 0420 01/17/15 0418  WBC 8.7 9.9 10.7*  HGB 7.9* 8.0* 8.6*  HCT 24.1* 24.2* 26.7*  PLT 114* 88* 103*   Coag's  Recent Labs Lab 01/12/15 0854 01/14/15 1806 01/17/15 0418  APTT 30 35  --   INR 1.10 1.92* 1.23   BMET  Recent Labs Lab 01/16/15 0420 01/16/15 1630  01/17/15 0418  NA 136 135 136  K 3.8 3.9 3.5  CL 101 103 88*  CO2 27 25 39*  BUN 5* <5* <5*  CREATININE 0.86 0.78 1.10*  GLUCOSE 135* 162* 118*   Electrolytes  Recent Labs Lab 01/16/15 0420 01/16/15 1030 01/16/15 1630 01/16/15 2045 01/17/15 0418  CALCIUM 7.6*  --  7.5*  --  7.3*  MG 2.3 2.5*  --  2.3 2.3  PHOS 2.8 2.2* 1.5* 1.8* 2.1*   Sepsis Markers  Recent Labs Lab 01/14/15 1910 01/15/15 0024 01/15/15 0433  LATICACIDVEN 16.5* >17.00* 11.81*   ABG  Recent Labs Lab 01/15/15 0400 01/16/15 1413 01/17/15 0221  PHART 7.182* 7.199* 7.274*  PCO2ART 25.9* 52.4* 77.6*  PO2ART 182* 68.0* 61.0*   Liver Enzymes  Recent Labs Lab 01/14/15 1445 01/14/15 1806  01/16/15 0420 01/16/15 1630 01/17/15 0418  AST 38 56*  --   --   --   --   ALT 18 27  --   --   --   --   ALKPHOS 43 42  --   --   --   --   BILITOT 0.8 1.0  --   --   --   --   ALBUMIN 3.8 3.3*  < > 3.4* 3.4* 3.6  < > = values in this interval not displayed. Cardiac Enzymes  Recent Labs Lab 01/14/15 1806  TROPONINI <0.03   Glucose  Recent  Labs Lab 01/16/15 1137 01/16/15 1531 01/16/15 1932 01/16/15 2335 01/17/15 0344 01/17/15 0734  GLUCAP 188* 193* 116* 144* 114* 162*    Imaging No results found. ASSESSMENT / PLAN:  PULMONARY OETT 9/3>>>9/4 A: Paradoxical breathing this AM and crackles on exam, guppy breathingm, copd P:   - despite neg 2.2 liters and a clearing chest xray she remains in resp failure -pcxr in am  -likely to require intubation, understanding she does not want trach -i have concerns about the amount of bicarb she is receiving , will increase co2 -failed bipap attempts 9/5, will discuss intubation now with family -doppler legs, get ecg, trop, treat copd more aggressive, add steroids, increase BDI  CARDIOVASCULAR CVL L IJ TLC 9/3>>> L radial a-line 9/3>>> A: Septic shock from presumed UTI. P:  - kvo -ecg, trop for additional etiology distress resp -get echo, pa  pressures?  RENAL A:  Acute on chronic renal failure. Acidosis Resolved. Hyperkalemia resolved. P:   cvvhd Intubation Then reduce bicarb Neg balance goals remain, although pcxr has cleared  GASTROINTESTINAL A:  No active issues. P:   - if ett then start TF -lft in am  - PPI.  HEMATOLOGIC A:  No active issues. P:  - CBC in AM. - Transfuse per ICU protocol. -scd -no sub q hep this some bleeding at catheter site  INFECTIOUS A:  Likely UTI. Not a clinical picture of pNA P:   BCx2  9/3>>>NTD UC  9/3>>>NTD Sputum 9/3>>>ENTEROBACTER CLOACAE  Abx:  Vanc 9/3>>>off Zosyn 9/3>>>9/5 Ceftriaxone 9/6>>>  Narrow to ceftriaxone for urine probably  ENDOCRINE A:  No DM history.   P:   - Monitor cbg -ensure tsh  NEUROLOGIC A:  Metabolic encephalopathy. P:   - if ett, add back fent -cvvhd  FAMILY  - Updates: Spoke with husband and patient, LCB with no CPR/cardioversion/trach/peg, short term intubation only.   Will clarify wishe snow  Ccm time 35 min   Mcarthur Rossetti. Tyson Alias, MD, FACP Pgr: [phone] [facility]

## 2015-01-17 NOTE — Progress Notes (Signed)
eLink Physician-Brief Progress Note Patient Name: Pamela [name] DOB: 1950/06/26 MRN: [id number]   Date of Service  01/17/2015  HPI/Events of Note  Hypophospatemia  eICU Interventions  Phos replaced     Intervention Category Intermediate Interventions: Electrolyte abnormality - evaluation and management  DETERDING,ELIZABETH 01/17/2015, 4:59 AM

## 2015-01-18 ENCOUNTER — Inpatient Hospital Stay (HOSPITAL_COMMUNITY): Payer: Medicare Other

## 2015-01-18 DIAGNOSIS — R0600 Dyspnea, unspecified: Secondary | ICD-10-CM

## 2015-01-18 LAB — URINALYSIS, ROUTINE W REFLEX MICROSCOPIC
BILIRUBIN URINE: NEGATIVE
Glucose, UA: 250 mg/dL — AB
KETONES UR: 15 mg/dL — AB
Leukocytes, UA: NEGATIVE
Nitrite: NEGATIVE
Specific Gravity, Urine: 1.007 (ref 1.005–1.030)
UROBILINOGEN UA: 0.2 mg/dL (ref 0.0–1.0)
pH: 6.5 (ref 5.0–8.0)

## 2015-01-18 LAB — COMPREHENSIVE METABOLIC PANEL
ALT: 19 U/L (ref 14–54)
AST: 26 U/L (ref 15–41)
Albumin: 3.3 g/dL — ABNORMAL LOW (ref 3.5–5.0)
Alkaline Phosphatase: 45 U/L (ref 38–126)
Anion gap: 14 (ref 5–15)
BUN: 15 mg/dL (ref 6–20)
CHLORIDE: 93 mmol/L — AB (ref 101–111)
CO2: 27 mmol/L (ref 22–32)
CREATININE: 1.54 mg/dL — AB (ref 0.44–1.00)
Calcium: 7.8 mg/dL — ABNORMAL LOW (ref 8.9–10.3)
GFR calc non Af Amer: 35 mL/min — ABNORMAL LOW (ref >60)
GFR, EST AFRICAN AMERICAN: 40 mL/min — AB (ref >60)
Glucose, Bld: 206 mg/dL — ABNORMAL HIGH (ref 65–99)
POTASSIUM: 3.6 mmol/L (ref 3.5–5.1)
SODIUM: 134 mmol/L — AB (ref 135–145)
Total Bilirubin: 1.2 mg/dL (ref 0.3–1.2)
Total Protein: 5.7 g/dL — ABNORMAL LOW (ref 6.5–8.1)

## 2015-01-18 LAB — GLUCOSE, CAPILLARY
GLUCOSE-CAPILLARY: 194 mg/dL — AB (ref 65–99)
GLUCOSE-CAPILLARY: 226 mg/dL — AB (ref 65–99)
GLUCOSE-CAPILLARY: 296 mg/dL — AB (ref 65–99)
Glucose-Capillary: 201 mg/dL — ABNORMAL HIGH (ref 65–99)
Glucose-Capillary: 163 mg/dL — ABNORMAL HIGH (ref 65–99)
Glucose-Capillary: 291 mg/dL — ABNORMAL HIGH (ref 65–99)

## 2015-01-18 LAB — URINE MICROSCOPIC-ADD ON

## 2015-01-18 LAB — CBC WITH DIFFERENTIAL/PLATELET
Basophils Absolute: 0 10*3/uL (ref 0.0–0.1)
Basophils Relative: 0 % (ref 0–1)
EOS ABS: 0 10*3/uL (ref 0.0–0.7)
Eosinophils Relative: 0 % (ref 0–5)
HEMATOCRIT: 26.6 % — AB (ref 36.0–46.0)
HEMOGLOBIN: 8.6 g/dL — AB (ref 12.0–15.0)
LYMPHS ABS: 0.4 10*3/uL — AB (ref 0.7–4.0)
LYMPHS PCT: 4 % — AB (ref 12–46)
MCH: 29.5 pg (ref 26.0–34.0)
MCHC: 32.3 g/dL (ref 30.0–36.0)
MCV: 91.1 fL (ref 78.0–100.0)
MONOS PCT: 3 % (ref 3–12)
Monocytes Absolute: 0.3 10*3/uL (ref 0.1–1.0)
NEUTROS ABS: 8.4 10*3/uL — AB (ref 1.7–7.7)
NEUTROS PCT: 92 % — AB (ref 43–77)
Platelets: 103 10*3/uL — ABNORMAL LOW (ref 150–400)
RBC: 2.92 MIL/uL — AB (ref 3.87–5.11)
RDW: 16 % — ABNORMAL HIGH (ref 11.5–15.5)
WBC: 9.1 10*3/uL (ref 4.0–10.5)

## 2015-01-18 LAB — POCT I-STAT 3, ART BLOOD GAS (G3+)
Acid-Base Excess: 7 mmol/L — ABNORMAL HIGH (ref 0.0–2.0)
Acid-Base Excess: 3 mmol/L — ABNORMAL HIGH (ref 0.0–2.0)
BICARBONATE: 32.6 meq/L — AB (ref 20.0–24.0)
BICARBONATE: 28.9 meq/L — AB (ref 20.0–24.0)
O2 Saturation: 98 %
O2 Saturation: 81 %
PH ART: 7.385 (ref 7.350–7.450)
TCO2: 34 mmol/L (ref 0–100)
TCO2: 30 mmol/L (ref 0–100)
pCO2 arterial: 50.4 mmHg — ABNORMAL HIGH (ref 35.0–45.0)
pCO2 arterial: 48.4 mmHg — ABNORMAL HIGH (ref 35.0–45.0)
pH, Arterial: 7.418 (ref 7.350–7.450)
pO2, Arterial: 102 mmHg — ABNORMAL HIGH (ref 80.0–100.0)
pO2, Arterial: 47 mmHg — ABNORMAL LOW (ref 80.0–100.0)

## 2015-01-18 LAB — HEPARIN INDUCED PLATELET AB (HIT ANTIBODY): Heparin Induced Plt Ab: 0.095 OD (ref 0.000–0.400)

## 2015-01-18 LAB — MAGNESIUM
MAGNESIUM: 2.3 mg/dL (ref 1.7–2.4)
Magnesium: 2.3 mg/dL (ref 1.7–2.4)
Magnesium: 1.8 mg/dL (ref 1.7–2.4)

## 2015-01-18 LAB — PHOSPHORUS
PHOSPHORUS: 2.5 mg/dL (ref 2.5–4.6)
Phosphorus: 4.2 mg/dL (ref 2.5–4.6)

## 2015-01-18 MED ORDER — METHYLPREDNISOLONE SODIUM SUCC 40 MG IJ SOLR
40.0000 mg | Freq: Two times a day (BID) | INTRAMUSCULAR | Status: DC
  Administered 2015-01-18: 40 mg via INTRAVENOUS
  Filled 2015-01-18 (×4): qty 1

## 2015-01-18 MED ORDER — HEPARIN SODIUM (PORCINE) 1000 UNIT/ML DIALYSIS
1000.0000 [IU] | Freq: Once | INTRAMUSCULAR | Status: DC
  Filled 2015-01-18: qty 6
  Filled 2015-01-18: qty 3

## 2015-01-18 MED ORDER — FUROSEMIDE 10 MG/ML IJ SOLN
160.0000 mg | Freq: Three times a day (TID) | INTRAVENOUS | Status: DC
  Administered 2015-01-18 – 2015-01-20 (×6): 160 mg via INTRAVENOUS
  Filled 2015-01-18 (×9): qty 16

## 2015-01-18 MED ORDER — PROPOFOL 1000 MG/100ML IV EMUL
5.0000 ug/kg/min | INTRAVENOUS | Status: DC
  Administered 2015-01-18: 25 ug/kg/min via INTRAVENOUS
  Administered 2015-01-18 – 2015-01-19 (×2): 15 ug/kg/min via INTRAVENOUS
  Filled 2015-01-18 (×2): qty 100

## 2015-01-18 MED ORDER — AMLODIPINE BESYLATE 10 MG PO TABS
10.0000 mg | ORAL_TABLET | Freq: Every day | ORAL | Status: DC
  Administered 2015-01-18 – 2015-01-19 (×2): 10 mg via ORAL
  Filled 2015-01-18 (×3): qty 1

## 2015-01-18 MED ORDER — SODIUM PHOSPHATE 3 MMOLE/ML IV SOLN
30.0000 mmol | Freq: Once | INTRAVENOUS | Status: AC
  Administered 2015-01-18: 30 mmol via INTRAVENOUS
  Filled 2015-01-18: qty 10

## 2015-01-18 MED ORDER — ACETAMINOPHEN 160 MG/5ML PO SOLN
650.0000 mg | Freq: Four times a day (QID) | ORAL | Status: DC | PRN
  Administered 2015-01-18: 650 mg via ORAL
  Filled 2015-01-18: qty 20.3

## 2015-01-18 NOTE — Progress Notes (Signed)
  Echocardiogram 2D Echocardiogram has been performed.  [name] 01/18/2015, 8:44 AM

## 2015-01-18 NOTE — Progress Notes (Signed)
Subjective:  Required intubation yest due to resp acidosis- is making more urine- 365 ccs- total another 2 liters out with CRRT Objective Vital signs in last 24 hours: Filed Vitals:   01/18/15 0400 01/18/15 0500 01/18/15 0600 01/18/15 0737  BP: 151/70 137/71 122/76 166/66  Pulse: 87 81 73 75  Temp: 98.8 F (37.1 C) 98.6 F (37 C) 98.2 F (36.8 C)   TempSrc: Core (Comment)     Resp: 15 16 20 20   Weight:  53.6 kg (118 lb 2.7 oz)    SpO2: 100% 100% 100% 100%   Weight change: -3 kg (-6 lb 9.8 oz)  Intake/Output Summary (Last 24 hours) at 01/18/15 8882 Last data filed at 01/18/15 0800  Gross per 24 hour  Intake 1159.61 ml  Output   3758 ml  Net -2598.39 ml    Assessment/ Plan: Pt is a 64 y.o. yo female who was admitted on 01/14/2015 with sepsis of unknown etiology after renal biopsy for diagnostic purposes  Assessment/Plan: 1. Renal- CKD as OP- crt climbing to over 3- s/p renal bx 9/1 for diagnostic purposes- prelim results just c/w hypertensive nephrosclerosis.  Now with A on CRF in the setting of presumed sepsis but of unknown source requiring CRRT since 9/3.   Clinically improved but now requiring intubation for respiratory acidosis.  I think is stable enough to do without CRRT- possibly will have more UOP and recovery vs needing IHD which I think she should tolerate 2. sepsis- no positive cultures- vanc and zosyn 3. Anemia- low but stable- on ESA as OP- no hematoma- cont to monitor 4. Secondary hyperparathyroidism- last PTH 102- no vit D- low phos repleting 5. HTN/volume- some volume overload- a line pressure is high- give scheduled lasix - also increased norvasc 6. VDRF- had to be re intubated mostly for resp acidosis 7. Acidosis- seems mostly resp  - corrected now with vent   Suhaylah [place]ole A    Labs: Basic Metabolic Panel:  Recent Labs Lab 01/17/15 0418 01/17/15 0945 01/17/15 1640 01/17/15 2026 01/18/15 0501  NA 136  --  136  --  134*  K 3.5  --  3.5  --  3.6   CL 88*  --  89*  --  93*  CO2 39*  --  32  --  27  GLUCOSE 118*  --  138*  --  206*  BUN <5*  --  6  --  15  CREATININE 1.10*  --  1.34*  --  1.54*  CALCIUM 7.3*  --  7.5*  --  7.8*  PHOS 2.1* 4.3 2.3* 2.0*  --    Liver Function Tests:  Recent Labs Lab 01/14/15 1445 01/14/15 1806  01/17/15 0418 01/17/15 1640 01/18/15 0501  AST 38 56*  --   --   --  26  ALT 18 27  --   --   --  19  ALKPHOS 43 42  --   --   --  45  BILITOT 0.8 1.0  --   --   --  1.2  PROT 6.2* 5.7*  --   --   --  5.7*  ALBUMIN 3.8 3.3*  < > 3.6 3.4* 3.3*  < > = values in this interval not displayed. No results for input(s): LIPASE, AMYLASE in the last 168 hours. No results for input(s): AMMONIA in the last 168 hours. CBC:  Recent Labs Lab 01/14/15 1445  01/14/15 1806 01/15/15 0507 01/16/15 0420 01/17/15 0418 01/18/15 0501  WBC 4.7  --  5.1 8.7 9.9 10.7* 9.1  NEUTROABS 4.2  --   --   --   --   --  8.4*  HGB 8.7*  < > 8.4* 7.9* 8.0* 8.6* 8.6*  HCT 27.5*  < > 27.0* 24.1* 24.2* 26.7* 26.6*  MCV 96.8  --  95.7 90.6 89.3 92.1 91.1  PLT 142*  --  159 114* 88* 103* 103*  < > = values in this interval not displayed. Cardiac Enzymes:  Recent Labs Lab 01/14/15 1806  TROPONINI <0.03   CBG:  Recent Labs Lab 01/17/15 1155 01/17/15 1521 01/17/15 2011 01/17/15 2355 01/18/15 0412  GLUCAP 137* 130* 103* 194* 201*    Iron Studies: No results for input(s): IRON, TIBC, TRANSFERRIN, FERRITIN in the last 72 hours. Studies/Results: Dg Chest Port 1 View  01/17/2015   CLINICAL DATA:  Ventilator dependent respiratory failure. Followup left basilar atelectasis.  EXAM: PORTABLE CHEST - 1 VIEW  COMPARISON:  01/15/2015 and earlier.  FINDINGS: Endotracheal tube tip in satisfactory position approximately 4 cm above the carina. Nasogastric tube courses below the diaphragm into the stomach though its tip is not included on the image. Left jugular central venous catheter tip projects over the mid to lower SVC.  Cardiac  silhouette normal in size, unchanged. Airspace opacities involving the lung bases bilaterally, right greater than left, progressive since the examination 2 days ago. Pulmonary vascularity normal. Possible small bilateral pleural effusions.  IMPRESSION: 1. Support apparatus satisfactory. 2. Pneumonia involving the lung bases, right greater than left, progressive since the examination 2 days ago. 3. Possible small bilateral pleural effusions.   Electronically Signed   By: Hulan Saas M.D.   On: 01/17/2015 11:28   Dg Abd Portable 1v  01/17/2015   CLINICAL DATA:  Nasogastric tube placement  EXAM: PORTABLE ABDOMEN - 1 VIEW  COMPARISON:  None.  FINDINGS: Orogastric tube with the tip projecting over the antrum of the stomach. There is no bowel dilatation to suggest obstruction. There is no evidence of pneumoperitoneum, portal venous gas or pneumatosis. There are no pathologic calcifications along the expected course of the ureters.The osseous structures are unremarkable.  IMPRESSION: Orogastric tube with the tip projecting over the antrum of the stomach.   Electronically Signed   By: Elige Ko   On: 01/17/2015 11:19   Medications: Infusions: . dialysis replacement fluid (prismasate) 300 mL/hr at 01/18/15 0149  . dialysis replacement fluid (prismasate) 300 mL/hr at 01/17/15 1045  . dialysate (PRISMASATE) 1,000 mL/hr at 01/18/15 1610  . propofol (DIPRIVAN) infusion 25 mcg/kg/min (01/18/15 0600)    Scheduled Medications: . amLODipine  5 mg Oral Daily  . antiseptic oral rinse  7 mL Mouth Rinse QID  . cefTRIAXone (ROCEPHIN)  IV  1 g Intravenous Q24H  . chlorhexidine gluconate  15 mL Mouth Rinse BID  . feeding supplement (VITAL HIGH PROTEIN)  1,000 mL Per Tube Q24H  . insulin aspart  0-20 Units Subcutaneous 6 times per day  . ipratropium-albuterol  3 mL Nebulization Q6H  . methylPREDNISolone (SOLU-MEDROL) injection  60 mg Intravenous Q8H  . pantoprazole (PROTONIX) IV  40 mg Intravenous Q24H    have  reviewed scheduled and prn medications.  Physical Exam: General: sedated , intubated but  alert Heart: RRR Lungs: mostly clear Abdomen: soft, non tender Extremities: pitting edema to dependent areas but better  Dialysis Access: femoral vascath placed 9/3    01/18/2015,8:08 AM  LOS: 4 days

## 2015-01-18 NOTE — Progress Notes (Signed)
eLink Physician-Brief Progress Note Patient Name: Pamela [name] DOB: February 15, 1951 MRN: [id number]   Date of Service  01/18/2015  HPI/Events of Note  Hypophosphatemia  eICU Interventions  Phos replaced     Intervention Category Intermediate Interventions: Electrolyte abnormality - evaluation and management  DETERDING,ELIZABETH 01/18/2015, 12:16 AM

## 2015-01-18 NOTE — Progress Notes (Signed)
PULMONARY / CRITICAL CARE MEDICINE   Name: Pamela [name] MRN: [id number] DOB: Oct 30, 1950    ADMISSION DATE:  01/14/2015 CONSULTATION DATE:  01/14/2015  REFERRING MD :  EDP  CHIEF COMPLAINT:  Acute renal failure, septic shock and respiratory failure.  INITIAL PRESENTATION: 64 year old female with PMH of stage 3 kidney disease who had a biopsy done 9/1 of her left kidney.  Being managed for renal failure, hyperkalemic, acidotic and in respiratory failure due to AMS. CVVVHD STUDIES:   SIGNIFICANT EVENTS: 9/3 intubated and renal consult called. 9/5- neg 2.2 liters, cvvhd 9/6- increase urine output, ETT placed 9/7 doppler>>> 9/7 echo>>>  SUBJECTIVE: SOB  VITAL SIGNS: Temp:  [97.7 F (36.5 C)-100 F (37.8 C)] 97.9 F (36.6 C) (09/07 0900) Pulse Rate:  [73-110] 102 (09/07 0900) Resp:  [11-26] 19 (09/07 0900) BP: (86-188)/(51-138) 182/75 mmHg (09/07 0937) SpO2:  [92 %-100 %] 94 % (09/07 0900) Arterial Line BP: (97-197)/(43-82) 194/81 mmHg (09/07 0900) FiO2 (%):  [40 %] 40 % (09/07 0737) Weight:  [53.6 kg (118 lb 2.7 oz)] 53.6 kg (118 lb 2.7 oz) (09/07 0500) HEMODYNAMICS: CVP:  [9 mmHg] 9 mmHg VENTILATOR SETTINGS: Vent Mode:  [-] PSV;CPAP FiO2 (%):  [40 %] 40 % Set Rate:  [20 bmp] 20 bmp Vt Set:  [440 mL] 440 mL PEEP:  [5 cmH20] 5 cmH20 Pressure Support:  [5 cmH20] 5 cmH20 Plateau Pressure:  [16 cmH20-22 cmH20] 22 cmH20 INTAKE / OUTPUT:  Intake/Output Summary (Last 24 hours) at 01/18/15 1000 Last data filed at 01/18/15 0925  Gross per 24 hour  Intake 1270.83 ml  Output   3710 ml  Net -2439.17 ml   PHYSICAL EXAMINATION: General: on vent Neuro:  Moves all ext to command, rass -2, FC pos HEENT:  PERRL Cardiovascular:  RRR, Nl S1/S2, -M/R/G. Lungs:  Ronchi, resolved wheezing Abdomen:  Soft, NT, ND and +BS. Musculoskeletal:  1+ edema and -tenderness. Skin:  Intact.  LABS:  CBC  Recent Labs Lab 01/16/15 0420 01/17/15 0418 01/18/15 0501  WBC 9.9 10.7* 9.1   HGB 8.0* 8.6* 8.6*  HCT 24.2* 26.7* 26.6*  PLT 88* 103* 103*   Coag's  Recent Labs Lab 01/12/15 0854 01/14/15 1806 01/17/15 0418  APTT 30 35  --   INR 1.10 1.92* 1.23   BMET  Recent Labs Lab 01/17/15 0418 01/17/15 1640 01/18/15 0501  NA 136 136 134*  K 3.5 3.5 3.6  CL 88* 89* 93*  CO2 39* 32 27  BUN <5* 6 15  CREATININE 1.10* 1.34* 1.54*  GLUCOSE 118* 138* 206*   Electrolytes  Recent Labs Lab 01/17/15 0418  01/17/15 1640 01/17/15 2026 01/18/15 0501 01/18/15 0919  CALCIUM 7.3*  --  7.5*  --  7.8*  --   MG 2.3  < >  --  2.2 2.3 2.3  PHOS 2.1*  < > 2.3* 2.0*  --  4.2  < > = values in this interval not displayed. Sepsis Markers  Recent Labs Lab 01/15/15 0024 01/15/15 0433 01/17/15 0937  LATICACIDVEN >17.00* 11.81* 0.56   ABG  Recent Labs Lab 01/17/15 0221 01/17/15 1049 01/18/15 0328  PHART 7.274* 7.403 7.418  PCO2ART 77.6* 79.2* 50.4*  PO2ART 61.0* 372.0* 102.0*   Liver Enzymes  Recent Labs Lab 01/14/15 1445 01/14/15 1806  01/17/15 0418 01/17/15 1640 01/18/15 0501  AST 38 56*  --   --   --  26  ALT 18 27  --   --   --  19  ALKPHOS 43 42  --   --   --  45  BILITOT 0.8 1.0  --   --   --  1.2  ALBUMIN 3.8 3.3*  < > 3.6 3.4* 3.3*  < > = values in this interval not displayed. Cardiac Enzymes  Recent Labs Lab 01/14/15 1806  TROPONINI <0.03   Glucose  Recent Labs Lab 01/17/15 1155 01/17/15 1521 01/17/15 2011 01/17/15 2355 01/18/15 0412 01/18/15 0815  GLUCAP 137* 130* 103* 194* 201* 163*    Imaging Dg Chest Port 1 View  01/17/2015   CLINICAL DATA:  Ventilator dependent respiratory failure. Followup left basilar atelectasis.  EXAM: PORTABLE CHEST - 1 VIEW  COMPARISON:  01/15/2015 and earlier.  FINDINGS: Endotracheal tube tip in satisfactory position approximately 4 cm above the carina. Nasogastric tube courses below the diaphragm into the stomach though its tip is not included on the image. Left jugular central venous catheter tip  projects over the mid to lower SVC.  Cardiac silhouette normal in size, unchanged. Airspace opacities involving the lung bases bilaterally, right greater than left, progressive since the examination 2 days ago. Pulmonary vascularity normal. Possible small bilateral pleural effusions.  IMPRESSION: 1. Support apparatus satisfactory. 2. Pneumonia involving the lung bases, right greater than left, progressive since the examination 2 days ago. 3. Possible small bilateral pleural effusions.   Electronically Signed   By: Hulan Saas M.D.   On: 01/17/2015 11:28   Dg Abd Portable 1v  01/17/2015   CLINICAL DATA:  Nasogastric tube placement  EXAM: PORTABLE ABDOMEN - 1 VIEW  COMPARISON:  None.  FINDINGS: Orogastric tube with the tip projecting over the antrum of the stomach. There is no bowel dilatation to suggest obstruction. There is no evidence of pneumoperitoneum, portal venous gas or pneumatosis. There are no pathologic calcifications along the expected course of the ureters.The osseous structures are unremarkable.  IMPRESSION: Orogastric tube with the tip projecting over the antrum of the stomach.   Electronically Signed   By: Elige Ko   On: 01/17/2015 11:19   ASSESSMENT / PLAN:  PULMONARY OETT 9/3>>>9/4 A: Paradoxical breathing this AM and crackles on exam, guppy breathingm, copd exacerbation? P:   -neg balance successful -doppler legs pending -BDi, improved wheezing with steroids, reduce to 40 q12h -pcxr in am  -maintain neg balance Weaning this am cpap 5 ps 5, will need abg at 2 hr -awake echo for pa pressures  CARDIOVASCULAR CVL L IJ TLC 9/3>>> L radial a-line 9/3>>> A: Septic shock from presumed UTI, r/o pulm htn contribution P:  - kvo -echo awaited -dopplers awaited -may dc aline soon  RENAL A:  Acute on chronic renal failure, increased urine output P:   cvvhd stop, lasix given , follow response abg to follow Chem in am   GASTROINTESTINAL A:  No active issues. P:   - to  T fgoal - PPI.  HEMATOLOGIC A:  No active issues. P:  - CBC in AM. - Transfuse per ICU protocol. -scd -no sub q hep this some bleeding at catheter site, change dressing and assess  INFECTIOUS A:  Likely UTI. Not a clinical picture of pNA P:   BCx2  9/3>>>NTD UC  9/3>>>NTD Sputum 9/3>>>ENTEROBACTER CLOACAE  Abx:  Vanc 9/3>>>off Zosyn 9/3>>>9/5 Ceftriaxone 9/6>>>  Maintain above, add stop date 3 days frmo now  ENDOCRINE A:  No DM history., tsh wnlo P:   - Monitor cbg  NEUROLOGIC A:  Metabolic encephalopathy improved after correction co2 P:   -  WUA, prop  FAMILY  - Updates: Spoke with husband and patient, LCB with no CPR/cardioversion/trach/peg, short term intubation only.  -updated husband, daughter, all agree to support further  Ccm time 35 min   Mcarthur Rossetti. Tyson Alias, MD, FACP Pgr: [phone] [facility]

## 2015-01-18 NOTE — Evaluation (Signed)
Physical Therapy Evaluation Patient Details Name: Pamela [name] MRN: [id number] DOB: Sep 17, 1950 Today's Date: 01/18/2015   History of Present Illness  Pt is a 64 y/o female with a PMH of stage III kidney disease who had a biopsy done 9/1 of L kidney. For 24 hours PTA pt continued to deteriorate from a mental status standpoint and had a LOC on 9/3 and was brought to the ED. In the ED pt was noted to be in renal failure, was hyperkalemic, acidotic, and in respiratory failure due to AMS. She was intubated 9/3-9/4, and reintubated 9/6.  Clinical Impression  Pt admitted with above diagnosis. Pt currently with functional limitations due to the deficits listed below (see PT Problem List). At the time of PT eval pt was able to perform transfers to/from EOB with +2 assist. Was able to tolerate some EOB exercise although fatigued quickly. Anticipate that pt will progress well functionally, and will plan for home health PT to follow up at d/c. Pt will benefit from skilled PT to increase their independence and safety with mobility to allow discharge to the venue listed below.       Follow Up Recommendations Home health PT;Supervision/Assistance - 24 hour    Equipment Recommendations  Rolling walker with 5" wheels;3in1 (PT)    Recommendations for Other Services       Precautions / Restrictions Precautions Precautions: Fall Precaution Comments: Vent Restrictions Weight Bearing Restrictions: No      Mobility  Bed Mobility Overal bed mobility: Needs Assistance;+2 for physical assistance Bed Mobility: Supine to Sit;Sit to Supine     Supine to sit: Max assist;+2 for physical assistance;HOB elevated Sit to supine: Max assist;+2 for physical assistance   General bed mobility comments: Assist for all aspects of bed mobility. Bed pad used to scoot to EOB and for positioning.   Transfers                 General transfer comment: Unable to attempt at this time.   Ambulation/Gait                 Stairs            Wheelchair Mobility    Modified Rankin (Stroke Patients Only)       Balance Overall balance assessment: Needs assistance Sitting-balance support: Feet supported;No upper extremity supported Sitting balance-Leahy Scale: Poor                                       Pertinent Vitals/Pain Pain Assessment: No/denies pain    Home Living Family/patient expects to be discharged to:: Private residence Living Arrangements: Spouse/significant other Available Help at Discharge: Family;Available 24 hours/day Type of Home: House Home Access: Stairs to enter   [employer] of Steps: 2 Home Layout: One level Home Equipment: None      Prior Function Level of Independence: Independent         Comments: Husband reports that he was assisting with the cooking/cleaning but pt was able to walk and perfrom all ADL's independently.      Hand Dominance        Extremity/Trunk Assessment   Upper Extremity Assessment: Generalized weakness           Lower Extremity Assessment: Generalized weakness      Cervical / Trunk Assessment: Normal  Communication   Communication: Other (comment) (Ventillated)  Cognition Arousal/Alertness: Awake/alert Behavior During Therapy: Flat  affect Overall Cognitive Status: Difficult to assess                      General Comments      Exercises General Exercises - Lower Extremity Long Arc Quad: 10 reps;Both      Assessment/Plan    PT Assessment Patient needs continued PT services  PT Diagnosis Difficulty walking;Generalized weakness   PT Problem List Decreased strength;Decreased range of motion;Decreased activity tolerance;Decreased balance;Decreased mobility;Decreased knowledge of use of DME;Decreased safety awareness;Decreased knowledge of precautions;Cardiopulmonary status limiting activity  PT Treatment Interventions DME instruction;Gait training;Stair  training;Functional mobility training;Therapeutic activities;Therapeutic exercise;Neuromuscular re-education;Patient/family education   PT Goals (Current goals can be found in the Care Plan section) Acute Rehab PT Goals Patient Stated Goal: Pt unable to state goals during session PT Goal Formulation: With family Time For Goal Achievement: 02/01/15 Potential to Achieve Goals: Fair    Frequency Min 3X/week   Barriers to discharge        Co-evaluation               End of Session Equipment Utilized During Treatment: Oxygen Activity Tolerance: Patient limited by fatigue Patient left: in bed;with call bell/phone within reach;with family/visitor present Nurse Communication: Mobility status;Need for lift equipment         Time: 1129-1150 PT Time Calculation (min) (ACUTE ONLY): 21 min   Charges:   PT Evaluation $Initial PT Evaluation Tier I: 1 Procedure     PT G Codes:        Conni Slipper 01-28-2015, 2:04 PM   Conni Slipper, PT, DPT Acute Rehabilitation Services Pager: [phone]

## 2015-01-19 ENCOUNTER — Inpatient Hospital Stay (HOSPITAL_COMMUNITY): Payer: Medicare Other

## 2015-01-19 DIAGNOSIS — R609 Edema, unspecified: Secondary | ICD-10-CM

## 2015-01-19 DIAGNOSIS — A419 Sepsis, unspecified organism: Principal | ICD-10-CM

## 2015-01-19 DIAGNOSIS — N179 Acute kidney failure, unspecified: Secondary | ICD-10-CM

## 2015-01-19 DIAGNOSIS — J9601 Acute respiratory failure with hypoxia: Secondary | ICD-10-CM

## 2015-01-19 DIAGNOSIS — N189 Chronic kidney disease, unspecified: Secondary | ICD-10-CM

## 2015-01-19 DIAGNOSIS — R6521 Severe sepsis with septic shock: Secondary | ICD-10-CM

## 2015-01-19 DIAGNOSIS — I339 Acute and subacute endocarditis, unspecified: Secondary | ICD-10-CM

## 2015-01-19 DIAGNOSIS — Z4659 Encounter for fitting and adjustment of other gastrointestinal appliance and device: Secondary | ICD-10-CM

## 2015-01-19 LAB — CBC WITH DIFFERENTIAL/PLATELET
BASOS PCT: 0 % (ref 0–1)
Basophils Absolute: 0 10*3/uL (ref 0.0–0.1)
Eosinophils Absolute: 0 10*3/uL (ref 0.0–0.7)
Eosinophils Relative: 0 % (ref 0–5)
HEMATOCRIT: 26.1 % — AB (ref 36.0–46.0)
HEMOGLOBIN: 8.5 g/dL — AB (ref 12.0–15.0)
Lymphocytes Relative: 5 % — ABNORMAL LOW (ref 12–46)
Lymphs Abs: 0.6 10*3/uL — ABNORMAL LOW (ref 0.7–4.0)
MCH: 29 pg (ref 26.0–34.0)
MCHC: 32.6 g/dL (ref 30.0–36.0)
MCV: 89.1 fL (ref 78.0–100.0)
MONOS PCT: 6 % (ref 3–12)
Monocytes Absolute: 0.7 10*3/uL (ref 0.1–1.0)
NEUTROS ABS: 10.8 10*3/uL — AB (ref 1.7–7.7)
NEUTROS PCT: 89 % — AB (ref 43–77)
Platelets: 130 10*3/uL — ABNORMAL LOW (ref 150–400)
RBC: 2.93 MIL/uL — AB (ref 3.87–5.11)
RDW: 15.9 % — ABNORMAL HIGH (ref 11.5–15.5)
WBC: 12.1 10*3/uL — ABNORMAL HIGH (ref 4.0–10.5)

## 2015-01-19 LAB — GLUCOSE, CAPILLARY
GLUCOSE-CAPILLARY: 110 mg/dL — AB (ref 65–99)
GLUCOSE-CAPILLARY: 238 mg/dL — AB (ref 65–99)
GLUCOSE-CAPILLARY: 247 mg/dL — AB (ref 65–99)
GLUCOSE-CAPILLARY: 307 mg/dL — AB (ref 65–99)
Glucose-Capillary: 223 mg/dL — ABNORMAL HIGH (ref 65–99)
Glucose-Capillary: 169 mg/dL — ABNORMAL HIGH (ref 65–99)

## 2015-01-19 LAB — RENAL FUNCTION PANEL
ANION GAP: 12 (ref 5–15)
Albumin: 3 g/dL — ABNORMAL LOW (ref 3.5–5.0)
BUN: 39 mg/dL — ABNORMAL HIGH (ref 6–20)
CALCIUM: 8.2 mg/dL — AB (ref 8.9–10.3)
CHLORIDE: 93 mmol/L — AB (ref 101–111)
CO2: 28 mmol/L (ref 22–32)
Creatinine, Ser: 2.48 mg/dL — ABNORMAL HIGH (ref 0.44–1.00)
GFR calc non Af Amer: 19 mL/min — ABNORMAL LOW (ref >60)
GFR, EST AFRICAN AMERICAN: 23 mL/min — AB (ref >60)
Glucose, Bld: 266 mg/dL — ABNORMAL HIGH (ref 65–99)
Phosphorus: 3.5 mg/dL (ref 2.5–4.6)
Potassium: 3 mmol/L — ABNORMAL LOW (ref 3.5–5.1)
SODIUM: 133 mmol/L — AB (ref 135–145)

## 2015-01-19 LAB — CULTURE, BLOOD (ROUTINE X 2)
Culture: NO GROWTH
Culture: NO GROWTH

## 2015-01-19 LAB — MAGNESIUM
MAGNESIUM: 2.2 mg/dL (ref 1.7–2.4)
Magnesium: 2.3 mg/dL (ref 1.7–2.4)
Magnesium: 2.3 mg/dL (ref 1.7–2.4)

## 2015-01-19 LAB — PHOSPHORUS
PHOSPHORUS: 3.7 mg/dL (ref 2.5–4.6)
PHOSPHORUS: 3.2 mg/dL (ref 2.5–4.6)

## 2015-01-19 MED ORDER — PREDNISONE 20 MG PO TABS
40.0000 mg | ORAL_TABLET | Freq: Every day | ORAL | Status: DC
  Administered 2015-01-19 – 2015-01-20 (×2): 40 mg via ORAL
  Filled 2015-01-19 (×3): qty 2

## 2015-01-19 MED ORDER — CARVEDILOL 3.125 MG PO TABS
3.1250 mg | ORAL_TABLET | Freq: Two times a day (BID) | ORAL | Status: DC
  Administered 2015-01-19: 3.125 mg via ORAL
  Filled 2015-01-19 (×4): qty 1

## 2015-01-19 MED ORDER — POTASSIUM CHLORIDE 20 MEQ PO PACK
40.0000 meq | PACK | Freq: Once | ORAL | Status: DC
  Filled 2015-01-19: qty 2

## 2015-01-19 MED ORDER — POTASSIUM CHLORIDE 20 MEQ/15ML (10%) PO SOLN
40.0000 meq | Freq: Once | ORAL | Status: AC
  Administered 2015-01-19: 40 meq via ORAL
  Filled 2015-01-19 (×2): qty 30

## 2015-01-19 MED ORDER — HEPARIN SODIUM (PORCINE) 5000 UNIT/ML IJ SOLN
5000.0000 [IU] | Freq: Three times a day (TID) | INTRAMUSCULAR | Status: DC
  Administered 2015-01-19 – 2015-02-01 (×34): 5000 [IU] via SUBCUTANEOUS
  Filled 2015-01-19 (×36): qty 1

## 2015-01-19 MED ORDER — DARBEPOETIN ALFA 100 MCG/0.5ML IJ SOSY
100.0000 ug | PREFILLED_SYRINGE | INTRAMUSCULAR | Status: DC
  Administered 2015-01-20 – 2015-01-27 (×2): 100 ug via SUBCUTANEOUS
  Filled 2015-01-19 (×3): qty 0.5

## 2015-01-19 MED ORDER — ISOSORB DINITRATE-HYDRALAZINE 20-37.5 MG PO TABS
1.0000 | ORAL_TABLET | Freq: Three times a day (TID) | ORAL | Status: DC
  Administered 2015-01-19 – 2015-01-27 (×23): 1 via ORAL
  Filled 2015-01-19 (×28): qty 1

## 2015-01-19 MED ORDER — POTASSIUM CHLORIDE CRYS ER 20 MEQ PO TBCR: 40.0000 meq | EXTENDED_RELEASE_TABLET | Freq: Once | ORAL | Status: DC

## 2015-01-19 NOTE — Progress Notes (Signed)
*  PRELIMINARY RESULTS* Vascular Ultrasound Left upper extremity venous duplex has been completed.  Preliminary findings: negative for DVT. Superficial thrombosis noted in the left cephalic vein at [facility].   Farrel Demark, RDMS, RVT  01/19/2015, 3:41 PM

## 2015-01-19 NOTE — Progress Notes (Signed)
PULMONARY / CRITICAL CARE MEDICINE   Name: Pamela [name] MRN: [id number] DOB: July 02, 1950    ADMISSION DATE:  01/14/2015 CONSULTATION DATE:  01/14/2015  REFERRING MD :  EDP  CHIEF COMPLAINT:  Acute renal failure, septic shock and respiratory failure.  INITIAL PRESENTATION: 64 year old female with PMH of stage 3 kidney disease who had a biopsy done 9/1 of her left kidney.  Being managed for renal failure, hyperkalemic, acidotic and in respiratory failure due to AMS. CVVVHD STUDIES:   SIGNIFICANT EVENTS: 9/3 intubated and renal consult called. 9/5- neg 2.2 liters, cvvhd 9/6- increase urine output, ETT placed 9/7 doppler>>>neg 9/7 echo>>>25-30%, pa 40, thick aortic  SUBJECTIVE: EF noted on echo low, making urine with lasix, 30 cc/hr!!  VITAL SIGNS: Temp:  [99.1 F (37.3 C)-101.9 F (38.8 C)] 100.5 F (38.1 C) (09/08 0900) Pulse Rate:  [83-119] 115 (09/08 0900) Resp:  [14-30] 22 (09/08 0900) BP: (112-183)/(56-88) 150/73 mmHg (09/08 0900) SpO2:  [94 %-100 %] 99 % (09/08 0900) Arterial Line BP: (107-204)/(58-162) 113/82 mmHg (09/08 0900) FiO2 (%):  [40 %] 40 % (09/08 0733) Weight:  [53 kg (116 lb 13.5 oz)] 53 kg (116 lb 13.5 oz) (09/08 0354) HEMODYNAMICS: CVP:  [3 mmHg-4 mmHg] 4 mmHg VENTILATOR SETTINGS: Vent Mode:  [-] PSV;CPAP FiO2 (%):  [40 %] 40 % Set Rate:  [20 bmp] 20 bmp Vt Set:  [440 mL] 440 mL PEEP:  [5 cmH20] 5 cmH20 Pressure Support:  [5 cmH20] 5 cmH20 Plateau Pressure:  [15 cmH20-23 cmH20] 15 cmH20 INTAKE / OUTPUT:  Intake/Output Summary (Last 24 hours) at 01/19/15 0929 Last data filed at 01/19/15 0900  Gross per 24 hour  Intake 1764.8 ml  Output    755 ml  Net 1009.8 ml   PHYSICAL EXAMINATION: General: on vent Neuro:  Moves all ext to command, rass 0, FC pos HEENT:  PERRL Cardiovascular:  RRR, Nl S1/S2 distant Lungs:  CTA Abdomen:  Soft, NT, ND and +BS. Musculoskeletal:  1+ edema left arm larger (will doppler) Skin:   Intact.  LABS:  CBC  Recent Labs Lab 01/17/15 0418 01/18/15 0501 01/19/15 0404  WBC 10.7* 9.1 12.1*  HGB 8.6* 8.6* 8.5*  HCT 26.7* 26.6* 26.1*  PLT 103* 103* 130*   Coag's  Recent Labs Lab 01/14/15 1806 01/17/15 0418  APTT 35  --   INR 1.92* 1.23   BMET  Recent Labs Lab 01/17/15 1640 01/18/15 0501 01/19/15 0403  NA 136 134* 133*  K 3.5 3.6 3.0*  CL 89* 93* 93*  CO2 32 27 28  BUN 6 15 39*  CREATININE 1.34* 1.54* 2.48*  GLUCOSE 138* 206* 266*   Electrolytes  Recent Labs Lab 01/17/15 1640  01/18/15 0501 01/18/15 0919 01/18/15 2018 01/19/15 0403  CALCIUM 7.5*  --  7.8*  --   --  8.2*  MG  --   < > 2.3 2.3 1.8 2.3  PHOS 2.3*  < >  --  4.2 2.5 3.5  < > = values in this interval not displayed. Sepsis Markers  Recent Labs Lab 01/15/15 0024 01/15/15 0433 01/17/15 0937  LATICACIDVEN >17.00* 11.81* 0.56   ABG  Recent Labs Lab 01/17/15 1049 01/18/15 0328 01/18/15 1023  PHART 7.403 7.418 7.385  PCO2ART 79.2* 50.4* 48.4*  PO2ART 372.0* 102.0* 47.0*   Liver Enzymes  Recent Labs Lab 01/14/15 1445 01/14/15 1806  01/17/15 1640 01/18/15 0501 01/19/15 0403  AST 38 56*  --   --  26  --   ALT  18 27  --   --  19  --   ALKPHOS 43 42  --   --  45  --   BILITOT 0.8 1.0  --   --  1.2  --   ALBUMIN 3.8 3.3*  < > 3.4* 3.3* 3.0*  < > = values in this interval not displayed. Cardiac Enzymes  Recent Labs Lab 01/14/15 1806  TROPONINI <0.03   Glucose  Recent Labs Lab 01/18/15 1120 01/18/15 1546 01/18/15 1928 01/18/15 2347 01/19/15 0343 01/19/15 0804  GLUCAP 226* 296* 291* 247* 307* 110*    Imaging Dg Chest Port 1 View  01/19/2015   CLINICAL DATA:  Renal failure.  EXAM: PORTABLE CHEST - 1 VIEW  COMPARISON:  01/17/2015.  FINDINGS: Endotracheal tube, left IJ line, NG tube in stable position. Heart size is stable. Bibasilar pulmonary infiltrates consistent pneumonia. Minimal interval improvement. No pleural effusion or pneumothorax.  IMPRESSION:  1. Lines and tubes in stable position. 2. Bibasilar infiltrates consistent with pneumonia. Minimal interval improvement.   Electronically Signed   By: Maisie Fus  Register   On: 01/19/2015 07:21   ASSESSMENT / PLAN:  PULMONARY OETT 9/3>>>9/4 A: Paradoxical breathing this AM and crackles on exam, guppy breathingm, copd exacerbation improved Edema  P:   -BDi, reduce steroids to pred 40 -pcxr in am, this am not impressive edema -maintain neg balance especially with ef noted Weaning this am cpap 5 ps 5  CARDIOVASCULAR CVL L IJ TLC 9/3>>> L radial a-line 9/3>>> A: Septic shock from presumed UTI, r/o pulm htn contribution P:  - kvo -echo noted, EF new?, cards evaluation -dopplers neg, get left upper, some edema -dc aline, get abg on wean  RENAL A:  Acute on chronic renal failure, increased urine output P:   Renal lasix per abg to follow Chem in am  k supp  GASTROINTESTINAL A:  No active issues. P:   - to T f hold weaning - PPI.  HEMATOLOGIC A:  dvt prevention, HITT neg  P:  - CBC in AM. - Transfuse per ICU protocol. -scd -add sub q hep, no further catheter oozing  INFECTIOUS A:  Likely UTI. Not a clinical picture of pNA, fever mild P:   BCx2  9/3>>>NTD UC  9/3>>>NTD Sputum 9/3>>>ENTEROBACTER CLOACAE  Abx:  Vanc 9/3>>>off Zosyn 9/3>>>9/5 Ceftriaxone 9/6>>> stop date in place  UA neg If spike further, will consider TEE  ENDOCRINE A:  No DM history, some hyperglxyemia P:   - Monitor cbg -ssi res -if feeding, will add lantus  NEUROLOGIC A:  Metabolic encephalopathy improved after correction co2 P:   - WUA, prop off  FAMILY  - Updates: Spoke with husband and patient, LCB with no CPR/cardioversion/trach/peg, short term intubation only.  -updated husband, daughter, all agree to support further  Ccm time 35 min   Mcarthur Rossetti. Tyson Alias, MD, FACP Pgr: [phone] [facility]

## 2015-01-19 NOTE — Progress Notes (Signed)
Subjective:  aking more urine still - up to nearly 800 ccs on lasix- stable on vent- BP is better (lower) Objective Vital signs in last 24 hours: Filed Vitals:   01/19/15 0354 01/19/15 0400 01/19/15 0500 01/19/15 0600  BP:  144/75 144/72 144/73  Pulse:  95 95 93  Temp:  100.3 F (37.9 C) 100.9 F (38.3 C) 101 F (38.3 C)  TempSrc:  Core (Comment)    Resp:  Weight: 53 kg (116 lb 13.5 oz)     SpO2:  100% 100% 100%   Weight change: -0.6 kg (-1 lb 5.2 oz)  Intake/Output Summary (Last 24 hours) at 01/19/15 0740 Last data filed at 01/19/15 0600  Gross per 24 hour  Intake 1871.9 ml  Output   1182 ml  Net  689.9 ml    Assessment/ Plan: Pt is a 64 y.o. yo female who was admitted on 01/14/2015 with sepsis of unknown etiology after renal biopsy for diagnostic purposes  Assessment/Plan: 1. Renal- CKD as OP- crt climbing to over 3- s/p renal bx 9/1 for diagnostic purposes- prelim results just c/w hypertensive nephrosclerosis.  Now with A on CRF in the setting of presumed sepsis but of unknown source requiring CRRT  9/3-9/7.   Clinically improved but now requiring re intubation for respiratory acidosis.  CRRT stopped yesterday- making marginal urine- creatinine went up but baseline is high 2's so expected.  No indications for dialysis at this time 2. sepsis- no positive cultures- rocephin 3. Anemia- low but stable- on ESA as OP- will add on dose for tomorrow  4. Secondary hyperparathyroidism- last PTH 102- no vit D- phos 3.5 5. HTN/volume- some volume overload- a line pressure is better- on scheduled lasix and norvasc 6. VDRF- had to be re intubated mostly for resp acidosis 7. Acidosis- seems mostly resp  - corrected now with vent 8. Hypokalemia- replete today    Warren Kugelman A    Labs: Basic Metabolic Panel:  Recent Labs Lab 01/17/15 1640  01/18/15 0501 01/18/15 0919 01/18/15 2018 01/19/15 0403  NA 136  --  134*  --   --  133*  K 3.5  --  3.6  --   --  3.0*  CL  89*  --  93*  --   --  93*  CO2 32  --  27  --   --  28  GLUCOSE 138*  --  206*  --   --  266*  BUN 6  --  15  --   --  39*  CREATININE 1.34*  --  1.54*  --   --  2.48*  CALCIUM 7.5*  --  7.8*  --   --  8.2*  PHOS 2.3*  < >  --  4.2 2.5 3.5  < > = values in this interval not displayed. Liver Function Tests:  Recent Labs Lab 01/14/15 1445 01/14/15 1806  01/17/15 1640 01/18/15 0501 01/19/15 0403  AST 38 56*  --   --  26  --   ALT 18 27  --   --  19  --   ALKPHOS 43 42  --   --  45  --   BILITOT 0.8 1.0  --   --  1.2  --   PROT 6.2* 5.7*  --   --  5.7*  --   ALBUMIN 3.8 3.3*  < > 3.4* 3.3* 3.0*  < > = values in this interval not displayed. No results for input(s):  LIPASE, AMYLASE in the last 168 hours. No results for input(s): AMMONIA in the last 168 hours. CBC:  Recent Labs Lab 01/14/15 1445  01/15/15 0507 01/16/15 0420 01/17/15 0418 01/18/15 0501 01/19/15 0404  WBC 4.7  < > 8.7 9.9 10.7* 9.1 12.1*  NEUTROABS 4.2  --   --   --   --  8.4* 10.8*  HGB 8.7*  < > 7.9* 8.0* 8.6* 8.6* 8.5*  HCT 27.5*  < > 24.1* 24.2* 26.7* 26.6* 26.1*  MCV 96.8  < > 90.6 89.3 92.1 91.1 89.1  PLT 142*  < > 114* 88* 103* 103* 130*  < > = values in this interval not displayed. Cardiac Enzymes:  Recent Labs Lab 01/14/15 1806  TROPONINI <0.03   CBG:  Recent Labs Lab 01/18/15 1120 01/18/15 1546 01/18/15 1928 01/18/15 2347 01/19/15 0343  GLUCAP 226* 296* 291* 247* 307*    Iron Studies: No results for input(s): IRON, TIBC, TRANSFERRIN, FERRITIN in the last 72 hours. Studies/Results: Dg Chest Port 1 View  01/19/2015   CLINICAL DATA:  Renal failure.  EXAM: PORTABLE CHEST - 1 VIEW  COMPARISON:  01/17/2015.  FINDINGS: Endotracheal tube, left IJ line, NG tube in stable position. Heart size is stable. Bibasilar pulmonary infiltrates consistent pneumonia. Minimal interval improvement. No pleural effusion or pneumothorax.  IMPRESSION: 1. Lines and tubes in stable position. 2. Bibasilar  infiltrates consistent with pneumonia. Minimal interval improvement.   Electronically Signed   By: Maisie Fus  Register   On: 01/19/2015 07:21   Dg Chest Port 1 View  01/17/2015   CLINICAL DATA:  Ventilator dependent respiratory failure. Followup left basilar atelectasis.  EXAM: PORTABLE CHEST - 1 VIEW  COMPARISON:  01/15/2015 and earlier.  FINDINGS: Endotracheal tube tip in satisfactory position approximately 4 cm above the carina. Nasogastric tube courses below the diaphragm into the stomach though its tip is not included on the image. Left jugular central venous catheter tip projects over the mid to lower SVC.  Cardiac silhouette normal in size, unchanged. Airspace opacities involving the lung bases bilaterally, right greater than left, progressive since the examination 2 days ago. Pulmonary vascularity normal. Possible small bilateral pleural effusions.  IMPRESSION: 1. Support apparatus satisfactory. 2. Pneumonia involving the lung bases, right greater than left, progressive since the examination 2 days ago. 3. Possible small bilateral pleural effusions.   Electronically Signed   By: Hulan Saas M.D.   On: 01/17/2015 11:28   Dg Abd Portable 1v  01/17/2015   CLINICAL DATA:  Nasogastric tube placement  EXAM: PORTABLE ABDOMEN - 1 VIEW  COMPARISON:  None.  FINDINGS: Orogastric tube with the tip projecting over the antrum of the stomach. There is no bowel dilatation to suggest obstruction. There is no evidence of pneumoperitoneum, portal venous gas or pneumatosis. There are no pathologic calcifications along the expected course of the ureters.The osseous structures are unremarkable.  IMPRESSION: Orogastric tube with the tip projecting over the antrum of the stomach.   Electronically Signed   By: Elige Ko   On: 01/17/2015 11:19   Medications: Infusions: . propofol (DIPRIVAN) infusion 15 mcg/kg/min (01/19/15 1610)    Scheduled Medications: . amLODipine  10 mg Oral Daily  . antiseptic oral rinse  7 mL  Mouth Rinse QID  . cefTRIAXone (ROCEPHIN)  IV  1 g Intravenous Q24H  . chlorhexidine gluconate  15 mL Mouth Rinse BID  . feeding supplement (VITAL HIGH PROTEIN)  1,000 mL Per Tube Q24H  . furosemide  160 mg Intravenous 3  times per day  . insulin aspart  0-20 Units Subcutaneous 6 times per day  . ipratropium-albuterol  3 mL Nebulization Q6H  . methylPREDNISolone (SOLU-MEDROL) injection  40 mg Intravenous Q12H  . pantoprazole (PROTONIX) IV  40 mg Intravenous Q24H    have reviewed scheduled and prn medications.  Physical Exam: General: sedated , intubated but  alert Heart: RRR Lungs: mostly clear Abdomen: soft, non tender Extremities: pitting edema to dependent areas but better  Dialysis Access: femoral vascath placed 9/3    01/19/2015,7:40 AM  LOS: 5 days

## 2015-01-19 NOTE — Procedures (Signed)
Extubation Procedure Note  Patient Details:   Name: Pamela [name] DOB: 1950/09/07 MRN: [id number]   Airway Documentation:     Evaluation  O2 sats: stable throughout Complications: No apparent complications Patient did tolerate procedure well. Bilateral Breath Sounds: Clear, Diminished Suctioning: Airway Yes   Patient extubated to 3L nasal cannula per MD order.   Positive cuff leak noted.  No evidence of stridor.  Patient able to speak post extubation.  Sats currently 96%.  Vitals are stable.  No apparent complications.   Durwin Glaze 01/19/2015, 10:36 AM

## 2015-01-19 NOTE — Consult Note (Signed)
Advanced Heart Failure Team Consult Note  Referring Physician: Dr Tyson Alias Primary Physician: Dr Kristie Cowman Primary Cardiologist:  Dr Verdis Prime  Reason for Consultation: Newly reduced EF  HPI:    Pamela [name] is 64 y.o. female with history of CAD, DM, COPD, HTN, CKD stage IV, and hyperlipidemia. Cath 04/14/14 showed severe disease in a branch of the first diagonal, too small for stenting.  And recommended medical therapy.  It also showed global LV systolic dysfunction consistent with elevated end-diastolic pressure consistent with chronic systolic HF.   She was last seen on 10/24/14 by Dr Katrinka Blazing and was stable from a cardiology stand point after a recent adjustment in diuretic therapy. She was taking 40 mg lasix BID. Weight was 114 lb.   She had US renal biopsy on 9/1 per Dr. 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 She was admitted on 9/3 with malaise and weakness and found to be hypothermic, hypotensive with AMS, in AKI, hyperkalemic, and in profound metabolic acidosis.  She was intubated and started on broad spectrum ABX and pressors.   SIGNIFICANT EVENTS: 9/3 intubated and renal consult called. 9/5- neg 2.2 liters, cvvhd 9/6- increase urine output, ETT placed 9/7 doppler>>>neg 9/7 echo>>>25-30%, pa 40, thick aortic  As above, echo was checked on 9/7 to assess EF and PA pressures.  It was noted that her EF had declined from previous Echo and Cath.    She states that prior to getting sick she still had SOB the majority of the time, regardless of activity with her COPD.  She is not sure if it is worse with lying flat.  She would occasionally get swelling on her legs.  She would have very occasional chest pain with no clear aggravating factors and relief with 1-2 tabs of NTG.  She is not on home 02 or CPAP.  She had been relatively stable prior to this recent illness.  She is only out 0.7L overall but is down 12 lbs from her highest weight this admission with several  days of CVVH (9/3 -> 9/7). 116 lbs today with previous baseline of 112-114 lb.    Review of Systems: [y] = yes,  = no   General: Weight gain ; Weight loss ; Anorexia ; Fatigue [y]; Fever ; Chills ; Weakness [y]  Cardiac: Chest pain/pressure [y]; Resting SOB [y]; Exertional SOB [y]; Orthopnea ; Pedal Edema ; Palpitations ; Syncope ; Presyncope ; Paroxysmal nocturnal dyspnea[ ]   Pulmonary: Cough ; Wheezing[ ] ; Hemoptysis[ ] ; Sputum ; Snoring   GI: Vomiting[ ] ; Dysphagia[ ] ; Melena[ ] ; Hematochezia ; Heartburn[ ] ; Abdominal pain ; Constipation ; Diarrhea ; BRBPR   GU: Hematuria[ ] ; Dysuria ; Nocturia[ ]   Vascular: Pain in legs with walking ; Pain in feet with lying flat ; Non-healing sores ; Stroke ; TIA ; Slurred speech ;  Neuro: Headaches[ ] ; Vertigo[ ] ; Seizures[ ] ; Paresthesias[ ] ;Blurred vision ; Diplopia ; Vision changes   Ortho/Skin: Arthritis [y]; Joint pain [y]; Muscle pain ; Joint swelling ; Back Pain ; Rash   Psych: Depression[ ] ; Anxiety[ ]   Heme: Bleeding problems ; Clotting disorders ; Anemia   Endocrine: Diabetes ; Thyroid dysfunction[ ]   Home Medications Prior to Admission medications   Medication Sig Start Date End Date Taking? Authorizing Provider  ALPRAZolam (  XANAX) 0.5 MG tablet Take 0.5 mg by mouth 2 (two) times daily.   Yes Historical Provider, MD  amLODipine (NORVASC) 10 MG tablet Take 10 mg by mouth daily. 09/02/14  Yes Historical Provider, MD  amoxicillin-clavulanate (AUGMENTIN) 500-125 MG per tablet Take 1 tablet by mouth 2 (two) times daily. 01/13/15  Yes Historical Provider, MD  aspirin EC 81 MG tablet Take 81 mg by mouth daily.   Yes Historical Provider, MD  atorvastatin (LIPITOR) 10 MG tablet Take 10 mg by mouth every morning.   Yes Historical Provider, MD  carvedilol (COREG) 25 MG tablet Take 1 tablet (25 mg total) by mouth 3 (three) times daily. 04/20/14   Yes Lyn Records, MD  furosemide (LASIX) 40 MG tablet Take 1 tablet by mouth  daily Patient taking differently: Take 2 tablet by mouth  daily 07/19/14  Yes Lyn Records, MD  glimepiride (AMARYL) 2 MG tablet Take 2 mg by mouth daily. 09/02/14  Yes Historical Provider, MD  iron polysaccharides (NIFEREX) 150 MG capsule Take 1 capsule (150 mg total) by mouth daily. 03/28/14  Yes Rhetta Mura, MD  isosorbide dinitrate (ISORDIL) 20 MG tablet Take 1 tablet (20 mg total) by mouth 3 (three) times daily. 07/29/14  Yes Lyn Records, MD  latanoprost (XALATAN) 0.005 % ophthalmic solution Place 1 drop into both eyes at bedtime.   Yes Historical Provider, MD  metFORMIN (GLUCOPHAGE) 500 MG tablet Take 500-1,000 mg by mouth 3 (three) times daily. Takes 1000 mg in the morning, 500 mg at lunch, and 1000 mg at night   Yes Historical Provider, MD  nitroGLYCERIN (NITROSTAT) 0.4 MG SL tablet Place 0.4 mg under the tongue every 5 (five) minutes as needed for chest pain.   Yes Historical Provider, MD  predniSONE (STERAPRED UNI-PAK 21 TAB) 10 MG (21) TBPK tablet Take 10-20 mg by mouth as directed. 6 day dose pack 01/13/15  Yes Historical Provider, MD  PROAIR HFA 108 (90 BASE) MCG/ACT inhaler Inhale 2 puffs into the lungs every 4 (four) hours as needed for wheezing or shortness of breath.  01/13/15  Yes Historical Provider, MD  salmeterol (SEREVENT) 50 MCG/DOSE diskus inhaler Inhale 1 puff into the lungs 2 (two) times daily.   Yes Historical Provider, MD    Past Medical History: Past Medical History  Diagnosis Date  . PONV (postoperative nausea and vomiting)   . Hypertension   . COPD (chronic obstructive pulmonary disease)   . Diabetes mellitus without complication 10-29-12  . Coronary artery disease   . Shortness of breath dyspnea   . Anxiety   . Chronic kidney disease     Past Surgical History: Past Surgical History  Procedure Laterality Date  . Abdominal hysterectomy    . Elbow surgery Right     tendon  surgery  . Tubal ligation    . Breast surgery      cyst removed  . Cholecystectomy      '74-open  . Appendectomy      '74- open with gallbladder  . Cardiac catheterization      1 coronary stent placed  . Ganglion cyst excision Bilateral 10-29-12    wrist  . Anal fissure repair    . Colonoscopy with propofol N/A 11/17/2012    Procedure: COLONOSCOPY WITH PROPOFOL;  Surgeon: Charolett Bumpers, MD;  Location: [facility] ENDOSCOPY;  Service: Endoscopy;  Laterality: N/A;  . Esophagogastroduodenoscopy (egd) with propofol N/A 11/17/2012    Procedure: ESOPHAGOGASTRODUODENOSCOPY (EGD) WITH PROPOFOL;  Surgeon: Charolett Bumpers,  MD;  Location: [facility] ENDOS[facility];  Service: Endoscopy;  Laterality: N/A;  . Left heart catheterization with coronary angiogram N/A 04/14/2014    Procedure: LEFT HEART CATHETERIZATION WITH CORONARY ANGIOGRAM;  Surgeon: Lesleigh Noe, MD;  Location: [facility] CATH LAB;  Service: Cardiovascular;  Laterality: N/A;    Family History: Family History  Problem Relation Age of Onset  . Diabetes Father   . Cancer Father   . Heart attack Mother   . Hypertension Mother   . Hypertension Father   . Diabetes Sister   . Hypertension Sister   . Cancer Sister   . Hypertension Sister     Social History: Social History   Social History  . Marital Status: Married    Spouse Name: N/A  . Number of Children: N/A  . Years of Education: N/A   Social History Main Topics  . Smoking status: Former Smoker -- 1.50 packs/day    Types: Cigarettes    Quit date: 10/29/1992  . Smokeless tobacco: None  . Alcohol Use: No  . Drug Use: No  . Sexual Activity: Not Currently   Other Topics Concern  . None   Social History Narrative    Allergies:  No Known Allergies  Objective:    Vital Signs:   Temp:  [99.9 F (37.7 C)-101.9 F (38.8 C)] 101.5 F (38.6 C) (09/08 1300) Pulse Rate:  [83-124] 102 (09/08 1300) Resp:  [14-24] 15 (09/08 1300) BP: (112-183)/(56-88) 141/64 mmHg (09/08 1300) SpO2:  [91  %-100 %] 97 % (09/08 1300) Arterial Line BP: (106-204)/(58-162) 106/77 mmHg (09/08 1000) FiO2 (%):  [40 %] 40 % (09/08 0733) Weight:  [116 lb 13.5 oz (53 kg)] 116 lb 13.5 oz (53 kg) (09/08 0354) Last BM Date: 01/18/15  Weight change: Filed Weights   01/17/15 0500 01/18/15 0500 01/19/15 0354  Weight: 124 lb 12.5 oz (56.6 kg) 118 lb 2.7 oz (53.6 kg) 116 lb 13.5 oz (53 kg)    Intake/Output:   Intake/Output Summary (Last 24 hours) at 01/19/15 1337 Last data filed at 01/19/15 1300  Gross per 24 hour  Intake 1560.4 ml  Output    835 ml  Net  725.4 ml     Physical Exam: General:  Appears older than stated age. Ill appearing  HEENT: normal Neck: supple. JVP 10cm. Carotids 2+ bilat; no bruits. No lymphadenopathy or thryomegaly appreciated. Cor: PMI nondisplaced. Regular rate & rhythm. No rubs, gallops or murmurs. Lungs: crackles bibasilar Abdomen: soft, nontender, nondistended. No HSM. No bruits or masses. Good bowel sounds. Extremities: no cyanosis, clubbing, rash.  Large bruise with edema at left [facility]. No LE edema. SCDs in place. Neuro: alert & orientedx3, cranial nerves grossly intact. moves all 4 extremities w/o difficulty. Affect pleasant.  Telemetry: NSR 90s  Labs: Basic Metabolic Panel:  Recent Labs Lab 01/16/15 1630  01/17/15 0418  01/17/15 1640 01/17/15 2026 01/18/15 0501 01/18/15 0919 01/18/15 2018 01/19/15 0403 01/19/15 0917  NA 135  --  136  --  136  --  134*  --   --  133*  --   K 3.9  --  3.5  --  3.5  --  3.6  --   --  3.0*  --   CL 103  --  88*  --  89*  --  93*  --   --  93*  --   CO2 25  --  39*  --  32  --  27  --   --  28  --  GLUCOSE 162*  --  118*  --  138*  --  206*  --   --  266*  --   BUN <5*  --  <5*  --  6  --  15  --   --  39*  --   CREATININE 0.78  --  1.10*  --  1.34*  --  1.54*  --   --  2.48*  --   CALCIUM 7.5*  --  7.3*  --  7.5*  --  7.8*  --   --  8.2*  --   MG  --   < > 2.3  < >  --  2.2 2.3 2.3 1.8 2.3 2.3  PHOS 1.5*  < > 2.1*  < >  2.3* 2.0*  --  4.2 2.5 3.5 3.7  < > = values in this interval not displayed.  Liver Function Tests:  Recent Labs Lab 01/14/15 1445 01/14/15 1806  01/16/15 1630 01/17/15 0418 01/17/15 1640 01/18/15 0501 01/19/15 0403  AST 38 56*  --   --   --   --  26  --   ALT 18 27  --   --   --   --  19  --   ALKPHOS 43 42  --   --   --   --  45  --   BILITOT 0.8 1.0  --   --   --   --  1.2  --   PROT 6.2* 5.7*  --   --   --   --  5.7*  --   ALBUMIN 3.8 3.3*  < > 3.4* 3.6 3.4* 3.3* 3.0*  < > = values in this interval not displayed. No results for input(s): LIPASE, AMYLASE in the last 168 hours. No results for input(s): AMMONIA in the last 168 hours.  CBC:  Recent Labs Lab 01/14/15 1445  01/15/15 0507 01/16/15 0420 01/17/15 0418 01/18/15 0501 01/19/15 0404  WBC 4.7  < > 8.7 9.9 10.7* 9.1 12.1*  NEUTROABS 4.2  --   --   --   --  8.4* 10.8*  HGB 8.7*  < > 7.9* 8.0* 8.6* 8.6* 8.5*  HCT 27.5*  < > 24.1* 24.2* 26.7* 26.6* 26.1*  MCV 96.8  < > 90.6 89.3 92.1 91.1 89.1  PLT 142*  < > 114* 88* 103* 103* 130*  < > = values in this interval not displayed.  Cardiac Enzymes:  Recent Labs Lab 01/14/15 1806  TROPONINI <0.03    BNP: BNP (last 3 results)  Recent Labs  01/14/15 1440  BNP 2160.6*    ProBNP (last 3 results)  Recent Labs  03/25/14 0918  PROBNP 284.0*     CBG:  Recent Labs Lab 01/18/15 1928 01/18/15 2347 01/19/15 0343 01/19/15 0804 01/19/15 1208  GLUCAP 291* 247* 307* 110* 223*    Coagulation Studies:  Recent Labs  01/17/15 0418  LABPROT 15.7*  INR 1.23    Other results: EKG: 01/17/15 NSR with sinus arrhythmia. 97 bpm  Imaging: Dg Chest Port 1 View  01/19/2015   CLINICAL DATA:  Renal failure.  EXAM: PORTABLE CHEST - 1 VIEW  COMPARISON:  01/17/2015.  FINDINGS: Endotracheal tube, left IJ line, NG tube in stable position. Heart size is stable. Bibasilar pulmonary infiltrates consistent pneumonia. Minimal interval improvement. No pleural effusion or  pneumothorax.  IMPRESSION: 1. Lines and tubes in stable position. 2. Bibasilar infiltrates consistent with pneumonia. Minimal interval improvement.   Electronically Signed   By:  Thomas  Register   On: 01/19/2015 07:21      Medications:     Current Medications: . amLODipine  10 mg Oral Daily  . antiseptic oral rinse  7 mL Mouth Rinse QID  . cefTRIAXone (ROCEPHIN)  IV  1 g Intravenous Q24H  . chlorhexidine gluconate  15 mL Mouth Rinse BID  . [START ON 01/20/2015] darbepoetin (ARANESP) injection - NON-DIALYSIS  100 mcg Subcutaneous Q Fri-1800  . feeding supplement (VITAL HIGH PROTEIN)  1,000 mL Per Tube Q24H  . furosemide  160 mg Intravenous 3 times per day  . heparin subcutaneous  5,000 Units Subcutaneous 3 times per day  . insulin aspart  0-20 Units Subcutaneous 6 times per day  . ipratropium-albuterol  3 mL Nebulization Q6H  . pantoprazole (PROTONIX) IV  40 mg Intravenous Q24H  . predniSONE  40 mg Oral Q breakfast     Infusions: . propofol (DIPRIVAN) infusion 15 mcg/kg/min (01/19/15 0312)      Assessment/Plan   AILEENE LANUM is a 64 y.o. female admitted on 01/14/15 with sepsis of unknown etiology after renal biopsy on 9/1 for diagnostic purposes.  Cardiology was consulted because Echo showed newly decreased EF.  1. Septic Shock - Reason for admission - From presumed UTI per CCM note. - On rocephin.  Blood and Urine cultures negative to date. UA negative 2. Acute renal failure on CKD Stage IV (Baseline Cr High 2s) - Required CVVHD this admission with Cr > 7.  Off as of 9/7. - Making urine currently (~30cc an hr) on 160 mg lasix q 8 hrs. - Nephrology following 3. Acute respiratory failure with COPD - Required short term intubation this admission.  Now on [facility]. - On steroids and rocephin 4. Chronic systolic HF, EF 25-30%, ICM - Fluid status appears slightly elevated by JVP. Currently on 160 mg IV lasix q 8 hrs. - Reduced from 35-40% on echo 11/15 and cath 12/15 - Home meds  included coreg 25 mg TID, lasix 40 mg daily, and imdur 20 mg TID. - No ACE or ARB chronically with CKD. 5. CAD - Last cath 12/15. Severe disease first diagonal, medical treatment due to small caliber.  Widely patent LAD stent, non-critical circumflex and RC disease.  LVEF 35-40% - Had been on ASA 81 g and Lipitor 10 mg daily at home. 6. DM - Per primary team 7. Code status - LCB with no CPR and no cardioversion.  Length of Stay: 5  Graciella Freer PA-C 01/19/2015, 1:37 PM  Advanced Heart Failure Team Pager [phone] (M-F; 7a - 4p)  Please contact [facility] for night-coverage after hours (4p -7a ) and weekends on [email or web address]  Patient seen with PA, agree with the above note.  1. AKI on CKD IV: In setting of infection/sepsis.  She was on CVVH, now off.  UOP somewhat marginal, CVP 5.  2. ID: Septic shock initially, uncertain source.  Still with fever to 101 earlier today.  Had Enterobacter in sputum.  UA concerning for UTI but urine cultures negative.  Cannot rule out aortic valve vegetation on echo.  - Continue ceftriaxone.   - Given ?vegetation on echo, would plan TEE (tomorrow if there is availability).  3. Cardiomyopathy: EF 25-30%, down from 35-40%.  Had known ischemic cardiomyopathy.  She is tachycardic today, likely in setting of being off her Coreg (was on high dose at home). She is not volume overloaded on exam though her UOP has not been vigorous since CVVH was stopped.  No chest pain, initial troponin negative.  It is possible that she has a stress/sepsis cardiomyopathy superimposed on her pre-existing ischemic cardiomyopathy. - Will need repeat echo after she has recovered from this event to look for improvement.  - Stop amlodipine.  Start back on Coreg at 3.125 mg bid and titrate back up.  Can use Bidil 1 tab tid (BP runs relatively high).  4. CAD: See above, think this is stable.  5. COPD: Baseline significant exertional dyspnea.   Marca Ancona 01/19/2015 4:34 PM

## 2015-01-20 ENCOUNTER — Encounter (HOSPITAL_COMMUNITY): Payer: Self-pay

## 2015-01-20 ENCOUNTER — Inpatient Hospital Stay (HOSPITAL_COMMUNITY): Payer: Medicare Other

## 2015-01-20 ENCOUNTER — Encounter (HOSPITAL_COMMUNITY)
Admission: EM | Disposition: A | Discharge status: Home / Self Care | Payer: Self-pay | Source: Home / Self Care | Attending: Internal Medicine

## 2015-01-20 HISTORY — PX: TEE WITHOUT CARDIOVERSION: SHX5443

## 2015-01-20 LAB — RENAL FUNCTION PANEL
ANION GAP: 12 (ref 5–15)
Albumin: 2.8 g/dL — ABNORMAL LOW (ref 3.5–5.0)
BUN: 55 mg/dL — ABNORMAL HIGH (ref 6–20)
CHLORIDE: 95 mmol/L — AB (ref 101–111)
CO2: 29 mmol/L (ref 22–32)
Calcium: 8.3 mg/dL — ABNORMAL LOW (ref 8.9–10.3)
Creatinine, Ser: 3.76 mg/dL — ABNORMAL HIGH (ref 0.44–1.00)
GFR, EST AFRICAN AMERICAN: 14 mL/min — AB (ref >60)
GFR, EST NON AFRICAN AMERICAN: 12 mL/min — AB (ref >60)
Glucose, Bld: 173 mg/dL — ABNORMAL HIGH (ref 65–99)
POTASSIUM: 3.3 mmol/L — AB (ref 3.5–5.1)
Phosphorus: 3.6 mg/dL (ref 2.5–4.6)
Sodium: 136 mmol/L (ref 135–145)

## 2015-01-20 LAB — GLUCOSE, CAPILLARY
GLUCOSE-CAPILLARY: 101 mg/dL — AB (ref 65–99)
GLUCOSE-CAPILLARY: 164 mg/dL — AB (ref 65–99)
GLUCOSE-CAPILLARY: 190 mg/dL — AB (ref 65–99)
GLUCOSE-CAPILLARY: 216 mg/dL — AB (ref 65–99)
GLUCOSE-CAPILLARY: 251 mg/dL — AB (ref 65–99)
GLUCOSE-CAPILLARY: 277 mg/dL — AB (ref 65–99)
Glucose-Capillary: 171 mg/dL — ABNORMAL HIGH (ref 65–99)

## 2015-01-20 LAB — MAGNESIUM: Magnesium: 2.3 mg/dL (ref 1.7–2.4)

## 2015-01-20 LAB — TRIGLYCERIDES: Triglycerides: 102 mg/dL (ref <150)

## 2015-01-20 SURGERY — ECHOCARDIOGRAM, TRANSESOPHAGEAL
Anesthesia: Moderate Sedation

## 2015-01-20 MED ORDER — LIDOCAINE VISCOUS 2 % MT SOLN
OROMUCOSAL | Status: DC | PRN
  Administered 2015-01-20: 20 mL via OROMUCOSAL

## 2015-01-20 MED ORDER — IPRATROPIUM-ALBUTEROL 0.5-2.5 (3) MG/3ML IN SOLN: 3.0000 mL | RESPIRATORY_TRACT | Status: DC | PRN

## 2015-01-20 MED ORDER — LIDOCAINE VISCOUS 2 % MT SOLN
OROMUCOSAL | Status: AC
  Filled 2015-01-20: qty 15

## 2015-01-20 MED ORDER — POTASSIUM CHLORIDE CRYS ER 20 MEQ PO TBCR
40.0000 meq | EXTENDED_RELEASE_TABLET | Freq: Once | ORAL | Status: AC
  Administered 2015-01-20: 40 meq via ORAL
  Filled 2015-01-20: qty 2

## 2015-01-20 MED ORDER — IPRATROPIUM-ALBUTEROL 0.5-2.5 (3) MG/3ML IN SOLN: 3.0000 mL | Freq: Three times a day (TID) | RESPIRATORY_TRACT | Status: DC

## 2015-01-20 MED ORDER — FENTANYL CITRATE (PF) 100 MCG/2ML IJ SOLN
INTRAMUSCULAR | Status: DC | PRN
  Administered 2015-01-20: 25 ug via INTRAVENOUS

## 2015-01-20 MED ORDER — MIDAZOLAM HCL 5 MG/ML IJ SOLN
INTRAMUSCULAR | Status: AC
  Filled 2015-01-20: qty 2

## 2015-01-20 MED ORDER — LATANOPROST 0.005 % OP SOLN
1.0000 [drp] | Freq: Every day | OPHTHALMIC | Status: DC
  Administered 2015-01-20 – 2015-01-31 (×12): 1 [drp] via OPHTHALMIC
  Filled 2015-01-20: qty 2.5

## 2015-01-20 MED ORDER — PREDNISONE 20 MG PO TABS
20.0000 mg | ORAL_TABLET | Freq: Once | ORAL | Status: AC
  Administered 2015-01-21: 20 mg via ORAL
  Filled 2015-01-20 (×2): qty 1

## 2015-01-20 MED ORDER — CETYLPYRIDINIUM CHLORIDE 0.05 % MT LIQD
7.0000 mL | Freq: Two times a day (BID) | OROMUCOSAL | Status: DC
  Administered 2015-01-20 – 2015-02-01 (×20): 7 mL via OROMUCOSAL

## 2015-01-20 MED ORDER — CARVEDILOL 6.25 MG PO TABS
6.2500 mg | ORAL_TABLET | Freq: Two times a day (BID) | ORAL | Status: DC
  Administered 2015-01-20 – 2015-01-22 (×5): 6.25 mg via ORAL
  Filled 2015-01-20 (×6): qty 1

## 2015-01-20 MED ORDER — FENTANYL CITRATE (PF) 100 MCG/2ML IJ SOLN
INTRAMUSCULAR | Status: AC
  Filled 2015-01-20: qty 2

## 2015-01-20 MED ORDER — MIDAZOLAM HCL 10 MG/2ML IJ SOLN
INTRAMUSCULAR | Status: DC | PRN
  Administered 2015-01-20 (×2): 2 mg via INTRAVENOUS

## 2015-01-20 MED ORDER — INFLUENZA VAC SPLIT QUAD 0.5 ML IM SUSY
0.5000 mL | PREFILLED_SYRINGE | INTRAMUSCULAR | Status: AC
  Administered 2015-01-21: 0.5 mL via INTRAMUSCULAR
  Filled 2015-01-20: qty 0.5

## 2015-01-20 MED ORDER — PREDNISONE 10 MG PO TABS
10.0000 mg | ORAL_TABLET | Freq: Once | ORAL | Status: AC
  Administered 2015-01-22: 10 mg via ORAL
  Filled 2015-01-20: qty 1

## 2015-01-20 NOTE — Care Management Note (Addendum)
Case Management Note  Patient Details  Name: SARON KEIGHTLEY MRN: [id number] Date of Birth: 1950-08-26  Subjective/Objective:      Pt admitted with acute respiratory failure with hypoxemia    Action/Plan:  Pt is independent from home with husband.  Husband will provide recommended supervision post discharge.  Pt has transfer orders, unit CM will monitor for disposition needs   Expected Discharge Date:                  Expected Discharge Plan:  Home/Self Care  In-House Referral:     Discharge planning Services  CM Consult  Post Acute Care Choice:    Choice offered to:     DME Arranged:    DME Agency:     HH Arranged:    HH Agency:    Status of Service:  In process, will continue to follow  Medicare Important Message Given:  Yes-second notification given Date Medicare IM Given:    Medicare IM give by:    Date Additional Medicare IM Given:    Additional Medicare Important Message give by:     If discussed at Long Length of Stay Meetings, dates discussed:    Additional Comments: CM assessed pt, husband at bedside.  CM provided pt with [facility] agency choice in accordance with recommendations post PT evaluation, pt unable to decide at this time, sheet left with pt.  Pt has transfer order to tele unit, unit CM will follow up on discharge needs. Cherylann Parr, RN 01/20/2015, 11:28 AM

## 2015-01-20 NOTE — Progress Notes (Signed)
Subjective:  Had TEE this AM- valves look OK but EF is depressed.  making more urine still - up to nearly 800 ccs on lasix- extubated- BP remains better (lower) Objective Vital signs in last 24 hours: Filed Vitals:   01/20/15 0836 01/20/15 0840 01/20/15 0845 01/20/15 0915  BP: 131/62 124/62 127/60 128/58  Pulse: 103 102 102 68  Temp: 98.8 F (37.1 C)     TempSrc: Oral     Resp: 21 18 21 19   Weight:      SpO2: 96% 99% 99% 100%   Weight change:   Intake/Output Summary (Last 24 hours) at 01/20/15 1016 Last data filed at 01/20/15 1000  Gross per 24 hour  Intake   1142 ml  Output    945 ml  Net    197 ml    Assessment/ Plan: Pt is a 64 y.o. yo female who was admitted on 01/14/2015 with sepsis of unknown etiology after renal biopsy for diagnostic purposes  Assessment/Plan: 1. Renal- CKD as OP- crt climbing to over 3- s/p renal bx 9/1 for diagnostic purposes- prelim results just c/w hypertensive nephrosclerosis.  Now with A on CRF in the setting of presumed sepsis but of unknown source requiring CRRT  9/3-9/7.   Clinically improved making marginal urine- creatinine went up again baseline is high 2's so expected.  No indications for dialysis at this time but if goes up again tomorrow will need.  If needs HD tomorrow will do via femoral catheter and remove so can increase mobility 2. sepsis- no positive cultures- rocephin 3. Anemia- low but stable- on ESA as OP- will add on dose for today 4. Secondary hyperparathyroidism- last PTH 102- no vit D- phos 3.6 5. HTN/volume- some volume overload-  pressure is better- on scheduled lasix and norvasc 6. VDRF- had to be re intubated mostly for resp acidosis- now extubated again 7. Acidosis- seems mostly resp  - corrected now with vent 8. Hypokalemia- replete today again   Tashonna Descoteaux A    Labs: Basic Metabolic Panel:  Recent Labs Lab 01/18/15 0501  01/19/15 0403 01/19/15 0917 01/19/15 2207 01/20/15 0548  NA 134*  --  133*  --   --   136  K 3.6  --  3.0*  --   --  3.3*  CL 93*  --  93*  --   --  95*  CO2 27  --  28  --   --  29  GLUCOSE 206*  --  266*  --   --  173*  BUN 15  --  39*  --   --  55*  CREATININE 1.54*  --  2.48*  --   --  3.76*  CALCIUM 7.8*  --  8.2*  --   --  8.3*  PHOS  --   < > 3.5 3.7 3.2 3.6  < > = values in this interval not displayed. Liver Function Tests:  Recent Labs Lab 01/14/15 1445 01/14/15 1806  01/18/15 0501 01/19/15 0403 01/20/15 0548  AST 38 56*  --  26  --   --   ALT 18 27  --  19  --   --   ALKPHOS 43 42  --  45  --   --   BILITOT 0.8 1.0  --  1.2  --   --   PROT 6.2* 5.7*  --  5.7*  --   --   ALBUMIN 3.8 3.3*  < > 3.3* 3.0* 2.8*  < > =  values in this interval not displayed. No results for input(s): LIPASE, AMYLASE in the last 168 hours. No results for input(s): AMMONIA in the last 168 hours. CBC:  Recent Labs Lab 01/14/15 1445  01/15/15 0507 01/16/15 0420 01/17/15 0418 01/18/15 0501 01/19/15 0404  WBC 4.7  < > 8.7 9.9 10.7* 9.1 12.1*  NEUTROABS 4.2  --   --   --   --  8.4* 10.8*  HGB 8.7*  < > 7.9* 8.0* 8.6* 8.6* 8.5*  HCT 27.5*  < > 24.1* 24.2* 26.7* 26.6* 26.1*  MCV 96.8  < > 90.6 89.3 92.1 91.1 89.1  PLT 142*  < > 114* 88* 103* 103* 130*  < > = values in this interval not displayed. Cardiac Enzymes:  Recent Labs Lab 01/14/15 1806  TROPONINI <0.03   CBG:  Recent Labs Lab 01/19/15 1533 01/19/15 1936 01/19/15 2354 01/20/15 0356 01/20/15 0908  GLUCAP 169* 238* 190* 164* 101*    Iron Studies: No results for input(s): IRON, TIBC, TRANSFERRIN, FERRITIN in the last 72 hours. Studies/Results: Dg Chest Port 1 View  01/19/2015   CLINICAL DATA:  Renal failure.  EXAM: PORTABLE CHEST - 1 VIEW  COMPARISON:  01/17/2015.  FINDINGS: Endotracheal tube, left IJ line, NG tube in stable position. Heart size is stable. Bibasilar pulmonary infiltrates consistent pneumonia. Minimal interval improvement. No pleural effusion or pneumothorax.  IMPRESSION: 1. Lines and  tubes in stable position. 2. Bibasilar infiltrates consistent with pneumonia. Minimal interval improvement.   Electronically Signed   By: Maisie Fus  Register   On: 01/19/2015 07:21   Medications: Infusions: . propofol (DIPRIVAN) infusion 15 mcg/kg/min (01/19/15 9604)    Scheduled Medications: . antiseptic oral rinse  7 mL Mouth Rinse QID  . carvedilol  6.25 mg Oral BID WC  . cefTRIAXone (ROCEPHIN)  IV  1 g Intravenous Q24H  . chlorhexidine gluconate  15 mL Mouth Rinse BID  . darbepoetin (ARANESP) injection - NON-DIALYSIS  100 mcg Subcutaneous Q Fri-1800  . feeding supplement (VITAL HIGH PROTEIN)  1,000 mL Per Tube Q24H  . furosemide  160 mg Intravenous 3 times per day  . heparin subcutaneous  5,000 Units Subcutaneous 3 times per day  . insulin aspart  0-20 Units Subcutaneous 6 times per day  . ipratropium-albuterol  3 mL Nebulization Q6H  . isosorbide-hydrALAZINE  1 tablet Oral TID  . pantoprazole (PROTONIX) IV  40 mg Intravenous Q24H  . predniSONE  40 mg Oral Q breakfast    have reviewed scheduled and prn medications.  Physical Exam: General: extubated- cleared breakfast tray Heart: RRR Lungs: mostly clear Abdomen: soft, non tender Extremities: edema to dependent areas but better  Dialysis Access: femoral vascath placed 9/3    01/20/2015,10:16 AM  LOS: 6 days

## 2015-01-20 NOTE — Op Note (Signed)
No obvious vegetations MV normal  Mild MR TV normal AV with very mild calcification.  No obvious vegetation.  No AI PV normal  No PI LVEF is severely depressed. Mild fixed atherosclerotic plaquing of thoracic aorta

## 2015-01-20 NOTE — Progress Notes (Signed)
eLink Physician-Brief Progress Note Patient Name: Pamela [name] DOB: May 16, 1950 MRN: [id number]   Date of Service  01/20/2015  HPI/Events of Note  Patient requests home xalatan eye drops.   eICU Interventions  Will order Xalatan eye drops.     Intervention Category Minor Interventions: Routine modifications to care plan (e.g. PRN medications for pain, fever)  Sommer,Steven Eugene 01/20/2015, 6:24 PM

## 2015-01-20 NOTE — Progress Notes (Signed)
Physical Therapy Treatment Patient Details Name: Pamela [name] MRN: [id number] DOB: 1951-04-29 Today's Date: 01/20/2015    History of Present Illness Pt is a 64 y/o female with a PMH of stage III kidney disease who had a biopsy done 9/1 of L kidney. For 24 hours PTA pt continued to deteriorate from a mental status standpoint and had a LOC on 9/3 and was brought to the ED. In the ED pt was noted to be in renal failure, was hyperkalemic, acidotic, and in respiratory failure due to AMS. She was intubated 9/3-9/4, and reintubated 9/6, extubated on 9/8.    PT Comments    Patient progressing well this session able to ambulate some out of the room and tolerated without O2 briefly.  Family supportive and level of progress indicative of appropriate recommendations for HHPT at d/c.  Follow Up Recommendations  Home health PT;Supervision/Assistance - 24 hour     Equipment Recommendations  Rolling walker with 5" wheels;3in1 (PT)    Recommendations for Other Services       Precautions / Restrictions Precautions Precautions: Fall    Mobility  Bed Mobility Overal bed mobility: Needs Assistance Bed Mobility: Rolling;Sidelying to Sit Rolling: Supervision Sidelying to sit: Min assist       General bed mobility comments: cues for technique, bed in chair position initially, lowered to supine prior to attempting up to EOB and OOB  Transfers Overall transfer level: Needs assistance Equipment used: Rolling walker (2 wheeled) Transfers: Sit to/from Stand Sit to Stand: Min assist;+2 physical assistance         General transfer comment: +2 assist for safety with multiple lines  Ambulation/Gait Ambulation/Gait assistance: Min guard Ambulation Distance (Feet): 14 Feet Assistive device: Rolling walker (2 wheeled) Gait Pattern/deviations: Step-through pattern;Decreased stride length;Shuffle     General Gait Details: following with chair, but patient wanted to turn and walk back into room,  demonstrated SpO2 91% after ambulating on room air due to tubing would not reach; dropped to 86% and O2 replaced and back up > 90% quickly   Stairs            Wheelchair Mobility    Modified Rankin (Stroke Patients Only)       Balance Overall balance assessment: Needs assistance Sitting-balance support: Feet supported;No upper extremity supported Sitting balance-Leahy Scale: Fair     Standing balance support: Bilateral upper extremity supported Standing balance-Leahy Scale: Poor Standing balance comment: UE support for standing balance with c/o weakness in LE's                    Cognition Arousal/Alertness: Awake/alert Behavior During Therapy: WFL for tasks assessed/performed Overall Cognitive Status: Within Functional Limits for tasks assessed                      Exercises General Exercises - Lower Extremity Ankle Circles/Pumps: AROM;Both;10 reps;Seated    General Comments General comments (skin integrity, edema, etc.): spouse in room and daughter (who is in wheelchair with right AKA)      Pertinent Vitals/Pain Pain Assessment: No/denies pain    Home Living                      Prior Function            PT Goals (current goals can now be found in the care plan section) Progress towards PT goals: Progressing toward goals    Frequency  Min 3X/week    PT Plan  Co-evaluation             End of Session Equipment Utilized During Treatment: Gait belt;Oxygen Activity Tolerance: Patient limited by fatigue Patient left: in chair;with call bell/phone within reach;with family/visitor present     Time: 5681-2751 PT Time Calculation (min) (ACUTE ONLY): 23 min  Charges:  $Gait Training: 8-22 mins $Therapeutic Activity: 8-22 mins                    G Codes:      Lucee Brissett,CYNDI 02-15-2015, 11:41 AM  Sheran Lawless, PT [phone] 02/15/15

## 2015-01-20 NOTE — Interval H&P Note (Signed)
History and Physical Interval Note:  01/20/2015 8:08 AM  Pamela [name]  has presented today for surgery, with the diagnosis of aortic valve vegitation  The various methods of treatment have been discussed with the patient and family. After consideration of risks, benefits and other options for treatment, the patient has consented to  Procedure(s): TRANSESOPHAGEAL ECHOCARDIOGRAM (TEE) (N/A) as a surgical intervention .  The patient's history has been reviewed, patient examined, no change in status, stable for surgery.  I have reviewed the patient's chart and labs.  Questions were answered to the patient's satisfaction.     Dietrich Pates

## 2015-01-20 NOTE — Progress Notes (Signed)
  Echocardiogram Echocardiogram Transesophageal has been performed.  [name] 01/20/2015, 8:41 AM

## 2015-01-20 NOTE — Progress Notes (Addendum)
PULMONARY / CRITICAL CARE MEDICINE   Name: Pamela [name] MRN: [id number] DOB: 1950/11/13    ADMISSION DATE:  01/14/2015 CONSULTATION DATE:  01/14/2015  REFERRING MD :  EDP  CHIEF COMPLAINT:  Acute renal failure, septic shock and respiratory failure.  INITIAL PRESENTATION: 64 year old female with PMH of stage 3 kidney disease who had a biopsy done 9/1 of her left kidney.  Being managed for renal failure, hyperkalemic, acidotic and in respiratory failure due to AMS. CVVVHD STUDIES:   SIGNIFICANT EVENTS: 9/3 intubated and renal consult called. 9/5- neg 2.2 liters, cvvhd 9/6- increase urine output, ETT placed 9/7 doppler>>>neg 9/7 echo>>>25-30%, pa 40, thick aortic 9/8- extubated 9/9 tee>>>severely depressed, AV with very mild calcification. No obvious vegetation  9/9 - urine output increased  SUBJECTIVE:walked the icu after TEE  VITAL SIGNS: Temp:  [98.8 F (37.1 C)-101.8 F (38.8 C)] 98.8 F (37.1 C) (09/09 0836) Pulse Rate:  [68-114] 101 (09/09 1000) Resp:  [14-28] 25 (09/09 1000) BP: (107-153)/(51-99) 112/52 mmHg (09/09 1000) SpO2:  [88 %-100 %] 98 % (09/09 1000) HEMODYNAMICS: CVP:  [3 mmHg] 3 mmHg VENTILATOR SETTINGS:   INTAKE / OUTPUT:  Intake/Output Summary (Last 24 hours) at 01/20/15 1056 Last data filed at 01/20/15 1000  Gross per 24 hour  Intake   1142 ml  Output    945 ml  Net    197 ml   PHYSICAL EXAMINATION: General: no vent, no dsitress Neuro:  Moves all ext to command looks amazing HEENT:  PERRL Cardiovascular:  RRR, Nl S1/S2 distant Lungs:  CTA Abdomen:  Soft, NT, ND and +BS. Musculoskeletal:  1+ edema left arm Skin:  Intact.  LABS:  CBC  Recent Labs Lab 01/17/15 0418 01/18/15 0501 01/19/15 0404  WBC 10.7* 9.1 12.1*  HGB 8.6* 8.6* 8.5*  HCT 26.7* 26.6* 26.1*  PLT 103* 103* 130*   Coag's  Recent Labs Lab 01/14/15 1806 01/17/15 0418  APTT 35  --   INR 1.92* 1.23   BMET  Recent Labs Lab 01/18/15 0501 01/19/15 0403  01/20/15 0548  NA 134* 133* 136  K 3.6 3.0* 3.3*  CL 93* 93* 95*  CO2 BUN 15 39* 55*  CREATININE 1.54* 2.48* 3.76*  GLUCOSE 206* 266* 173*   Electrolytes  Recent Labs Lab 01/18/15 0501  01/19/15 0403 01/19/15 0917 01/19/15 2207 01/20/15 0548  CALCIUM 7.8*  --  8.2*  --   --  8.3*  MG 2.3  < > 2.3 2.3 2.2 2.3  PHOS  --   < > 3.5 3.7 3.2 3.6  < > = values in this interval not displayed. Sepsis Markers  Recent Labs Lab 01/15/15 0024 01/15/15 0433 01/17/15 0937  LATICACIDVEN >17.00* 11.81* 0.56   ABG  Recent Labs Lab 01/17/15 1049 01/18/15 0328 01/18/15 1023  PHART 7.403 7.418 7.385  PCO2ART 79.2* 50.4* 48.4*  PO2ART 372.0* 102.0* 47.0*   Liver Enzymes  Recent Labs Lab 01/14/15 1445 01/14/15 1806  01/18/15 0501 01/19/15 0403 01/20/15 0548  AST 38 56*  --  26  --   --   ALT 18 27  --  19  --   --   ALKPHOS 43 42  --  45  --   --   BILITOT 0.8 1.0  --  1.2  --   --   ALBUMIN 3.8 3.3*  < > 3.3* 3.0* 2.8*  < > = values in this interval not displayed. Cardiac Enzymes  Recent Labs Lab 01/14/15  1806  TROPONINI <0.03   Glucose  Recent Labs Lab 01/19/15 1208 01/19/15 1533 01/19/15 1936 01/19/15 2354 01/20/15 0356 01/20/15 0908  GLUCAP 223* 169* 238* 190* 164* 101*    Imaging No results found. ASSESSMENT / PLAN:  PULMONARY OETT 9/3>>>9/4 A: Paradoxical breathing this AM and crackles on exam, guppy breathingm, copd exacerbation improved Edema  P:   -BDi, dc pred over next 2 days -neg balance noted 1.6 liters, helped extubation success -needs outpt PFT and pulmonary  CARDIOVASCULAR CVL L IJ TLC 9/3>>>9/9 L radial a-line 9/3>>>9/8 A: Septic shock from presumed UTI, NEW cardiomyopathy unclear etiology, no veg no TEE P:  - kvo -tee reviewed -cath in future , pending renal resolution as outpt? -dc neck line  RENAL A:  Acute on chronic renal failure, increased urine output crt up P:   Consider lowering lasix, per  renal? Chem in am  k supp  GASTROINTESTINAL A: r/o dysphagia P:   -diet, advance after SLP Some couging noted  HEMATOLOGIC A:  dvt prevention, HITT neg, superficial clot left arm P:   -scd -sub q hep until full ambulation -elevate left arm, no cuff left arm  INFECTIOUS A:  Likely UTI / r/o PNA P:   BCx2  9/3>>>NTD UC  9/3>>>NTD Sputum 9/3>>>ENTEROBACTER CLOACAE  Abx:  Vanc 9/3>>>off Zosyn 9/3>>>9/5 Ceftriaxone 9/6>>> stop date in place  UA neg Tee neg  ENDOCRINE A:  No DM history, some hyperglyemia controlled P:   - Monitor cbg -ssi res  NEUROLOGIC A:  Metabolic encephalopathy improved after correction co2 P:   -slp -pt  FAMILY  - Updates: Spoke with husband and patient, LCB with no CPR/cardioversion/trach/peg, short term intubation only.  -updated husband, daughter, all agree to support further  To floor tele, triad  Mcarthur Rossetti. Tyson Alias, MD, FACP Pgr: [phone] [facility]

## 2015-01-20 NOTE — H&P (View-Only) (Signed)
Advanced Heart Failure Team Consult Note  Referring Physician: Dr Tyson Alias Primary Physician: Dr Kristie Cowman Primary Cardiologist:  Dr Verdis Prime  Reason for Consultation: Newly reduced EF  HPI:    Pamela [name] is 64 y.o. female with history of CAD, DM, COPD, HTN, CKD stage IV, and hyperlipidemia. Cath 04/14/14 showed severe disease in a branch of the first diagonal, too small for stenting.  And recommended medical therapy.  It also showed global LV systolic dysfunction consistent with elevated end-diastolic pressure consistent with chronic systolic HF.   She was last seen on 10/24/14 by Dr Katrinka Blazing and was stable from a cardiology stand point after a recent adjustment in diuretic therapy. She was taking 40 mg lasix BID. Weight was 114 lb.   She had US renal biopsy on 9/1 per Dr. 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 She was admitted on 9/3 with malaise and weakness and found to be hypothermic, hypotensive with AMS, in AKI, hyperkalemic, and in profound metabolic acidosis.  She was intubated and started on broad spectrum ABX and pressors.   SIGNIFICANT EVENTS: 9/3 intubated and renal consult called. 9/5- neg 2.2 liters, cvvhd 9/6- increase urine output, ETT placed 9/7 doppler>>>neg 9/7 echo>>>25-30%, pa 40, thick aortic  As above, echo was checked on 9/7 to assess EF and PA pressures.  It was noted that her EF had declined from previous Echo and Cath.    She states that prior to getting sick she still had SOB the majority of the time, regardless of activity with her COPD.  She is not sure if it is worse with lying flat.  She would occasionally get swelling on her legs.  She would have very occasional chest pain with no clear aggravating factors and relief with 1-2 tabs of NTG.  She is not on home 02 or CPAP.  She had been relatively stable prior to this recent illness.  She is only out 0.7L overall but is down 12 lbs from her highest weight this admission with several  days of CVVH (9/3 -> 9/7). 116 lbs today with previous baseline of 112-114 lb.    Review of Systems: [y] = yes,  = no   General: Weight gain ; Weight loss ; Anorexia ; Fatigue [y]; Fever ; Chills ; Weakness [y]  Cardiac: Chest pain/pressure [y]; Resting SOB [y]; Exertional SOB [y]; Orthopnea ; Pedal Edema ; Palpitations ; Syncope ; Presyncope ; Paroxysmal nocturnal dyspnea[ ]   Pulmonary: Cough ; Wheezing[ ] ; Hemoptysis[ ] ; Sputum ; Snoring   GI: Vomiting[ ] ; Dysphagia[ ] ; Melena[ ] ; Hematochezia ; Heartburn[ ] ; Abdominal pain ; Constipation ; Diarrhea ; BRBPR   GU: Hematuria[ ] ; Dysuria ; Nocturia[ ]   Vascular: Pain in legs with walking ; Pain in feet with lying flat ; Non-healing sores ; Stroke ; TIA ; Slurred speech ;  Neuro: Headaches[ ] ; Vertigo[ ] ; Seizures[ ] ; Paresthesias[ ] ;Blurred vision ; Diplopia ; Vision changes   Ortho/Skin: Arthritis [y]; Joint pain [y]; Muscle pain ; Joint swelling ; Back Pain ; Rash   Psych: Depression[ ] ; Anxiety[ ]   Heme: Bleeding problems ; Clotting disorders ; Anemia   Endocrine: Diabetes ; Thyroid dysfunction[ ]   Home Medications Prior to Admission medications   Medication Sig Start Date End Date Taking? Authorizing Provider  ALPRAZolam (  XANAX) 0.5 MG tablet Take 0.5 mg by mouth 2 (two) times daily.   Yes Historical Provider, MD  amLODipine (NORVASC) 10 MG tablet Take 10 mg by mouth daily. 09/02/14  Yes Historical Provider, MD  amoxicillin-clavulanate (AUGMENTIN) 500-125 MG per tablet Take 1 tablet by mouth 2 (two) times daily. 01/13/15  Yes Historical Provider, MD  aspirin EC 81 MG tablet Take 81 mg by mouth daily.   Yes Historical Provider, MD  atorvastatin (LIPITOR) 10 MG tablet Take 10 mg by mouth every morning.   Yes Historical Provider, MD  carvedilol (COREG) 25 MG tablet Take 1 tablet (25 mg total) by mouth 3 (three) times daily. 04/20/14   Yes Lyn Records, MD  furosemide (LASIX) 40 MG tablet Take 1 tablet by mouth  daily Patient taking differently: Take 2 tablet by mouth  daily 07/19/14  Yes Lyn Records, MD  glimepiride (AMARYL) 2 MG tablet Take 2 mg by mouth daily. 09/02/14  Yes Historical Provider, MD  iron polysaccharides (NIFEREX) 150 MG capsule Take 1 capsule (150 mg total) by mouth daily. 03/28/14  Yes Rhetta Mura, MD  isosorbide dinitrate (ISORDIL) 20 MG tablet Take 1 tablet (20 mg total) by mouth 3 (three) times daily. 07/29/14  Yes Lyn Records, MD  latanoprost (XALATAN) 0.005 % ophthalmic solution Place 1 drop into both eyes at bedtime.   Yes Historical Provider, MD  metFORMIN (GLUCOPHAGE) 500 MG tablet Take 500-1,000 mg by mouth 3 (three) times daily. Takes 1000 mg in the morning, 500 mg at lunch, and 1000 mg at night   Yes Historical Provider, MD  nitroGLYCERIN (NITROSTAT) 0.4 MG SL tablet Place 0.4 mg under the tongue every 5 (five) minutes as needed for chest pain.   Yes Historical Provider, MD  predniSONE (STERAPRED UNI-PAK 21 TAB) 10 MG (21) TBPK tablet Take 10-20 mg by mouth as directed. 6 day dose pack 01/13/15  Yes Historical Provider, MD  PROAIR HFA 108 (90 BASE) MCG/ACT inhaler Inhale 2 puffs into the lungs every 4 (four) hours as needed for wheezing or shortness of breath.  01/13/15  Yes Historical Provider, MD  salmeterol (SEREVENT) 50 MCG/DOSE diskus inhaler Inhale 1 puff into the lungs 2 (two) times daily.   Yes Historical Provider, MD    Past Medical History: Past Medical History  Diagnosis Date  . PONV (postoperative nausea and vomiting)   . Hypertension   . COPD (chronic obstructive pulmonary disease)   . Diabetes mellitus without complication 10-29-12  . Coronary artery disease   . Shortness of breath dyspnea   . Anxiety   . Chronic kidney disease     Past Surgical History: Past Surgical History  Procedure Laterality Date  . Abdominal hysterectomy    . Elbow surgery Right     tendon  surgery  . Tubal ligation    . Breast surgery      cyst removed  . Cholecystectomy      '74-open  . Appendectomy      '74- open with gallbladder  . Cardiac catheterization      1 coronary stent placed  . Ganglion cyst excision Bilateral 10-29-12    wrist  . Anal fissure repair    . Colonoscopy with propofol N/A 11/17/2012    Procedure: COLONOSCOPY WITH PROPOFOL;  Surgeon: Charolett Bumpers, MD;  Location: [facility];  Service: Endoscopy;  Laterality: N/A;  . Esophagogastroduodenoscopy (egd) with propofol N/A 11/17/2012    Procedure: ESOPHAGOGASTRODUODENOSCOPY (EGD) WITH PROPOFOL;  Surgeon: Charolett Bumpers,  MD;  Location: [facility];  Service: Endoscopy;  Laterality: N/A;  . Left heart catheterization with coronary angiogram N/A 04/14/2014    Procedure: LEFT HEART CATHETERIZATION WITH CORONARY ANGIOGRAM;  Surgeon: Lesleigh Noe, MD;  Location: [facility] CATH LAB;  Service: Cardiovascular;  Laterality: N/A;    Family History: Family History  Problem Relation Age of Onset  . Diabetes Father   . Cancer Father   . Heart attack Mother   . Hypertension Mother   . Hypertension Father   . Diabetes Sister   . Hypertension Sister   . Cancer Sister   . Hypertension Sister     Social History: Social History   Social History  . Marital Status: Married    Spouse Name: N/A  . Number of Children: N/A  . Years of Education: N/A   Social History Main Topics  . Smoking status: Former Smoker -- 1.50 packs/day    Types: Cigarettes    Quit date: 10/29/1992  . Smokeless tobacco: None  . Alcohol Use: No  . Drug Use: No  . Sexual Activity: Not Currently   Other Topics Concern  . None   Social History Narrative    Allergies:  No Known Allergies  Objective:    Vital Signs:   Temp:  [99.9 F (37.7 C)-101.9 F (38.8 C)] 101.5 F (38.6 C) (09/08 1300) Pulse Rate:  [83-124] 102 (09/08 1300) Resp:  [14-24] 15 (09/08 1300) BP: (112-183)/(56-88) 141/64 mmHg (09/08 1300) SpO2:  [91  %-100 %] 97 % (09/08 1300) Arterial Line BP: (106-204)/(58-162) 106/77 mmHg (09/08 1000) FiO2 (%):  [40 %] 40 % (09/08 0733) Weight:  [116 lb 13.5 oz (53 kg)] 116 lb 13.5 oz (53 kg) (09/08 0354) Last BM Date: 01/18/15  Weight change: Filed Weights   01/17/15 0500 01/18/15 0500 01/19/15 0354  Weight: 124 lb 12.5 oz (56.6 kg) 118 lb 2.7 oz (53.6 kg) 116 lb 13.5 oz (53 kg)    Intake/Output:   Intake/Output Summary (Last 24 hours) at 01/19/15 1337 Last data filed at 01/19/15 1300  Gross per 24 hour  Intake 1560.4 ml  Output    835 ml  Net  725.4 ml     Physical Exam: General:  Appears older than stated age. Ill appearing  HEENT: normal Neck: supple. JVP 10cm. Carotids 2+ bilat; no bruits. No lymphadenopathy or thryomegaly appreciated. Cor: PMI nondisplaced. Regular rate & rhythm. No rubs, gallops or murmurs. Lungs: crackles bibasilar Abdomen: soft, nontender, nondistended. No HSM. No bruits or masses. Good bowel sounds. Extremities: no cyanosis, clubbing, rash.  Large bruise with edema at left [facility]. No LE edema. SCDs in place. Neuro: alert & orientedx3, cranial nerves grossly intact. moves all 4 extremities w/o difficulty. Affect pleasant.  Telemetry: NSR 90s  Labs: Basic Metabolic Panel:  Recent Labs Lab 01/16/15 1630  01/17/15 0418  01/17/15 1640 01/17/15 2026 01/18/15 0501 01/18/15 0919 01/18/15 2018 01/19/15 0403 01/19/15 0917  NA 135  --  136  --  136  --  134*  --   --  133*  --   K 3.9  --  3.5  --  3.5  --  3.6  --   --  3.0*  --   CL 103  --  88*  --  89*  --  93*  --   --  93*  --   CO2 25  --  39*  --  32  --  27  --   --  28  --  GLUCOSE 162*  --  118*  --  138*  --  206*  --   --  266*  --   BUN <5*  --  <5*  --  6  --  15  --   --  39*  --   CREATININE 0.78  --  1.10*  --  1.34*  --  1.54*  --   --  2.48*  --   CALCIUM 7.5*  --  7.3*  --  7.5*  --  7.8*  --   --  8.2*  --   MG  --   < > 2.3  < >  --  2.2 2.3 2.3 1.8 2.3 2.3  PHOS 1.5*  < > 2.1*  < >  2.3* 2.0*  --  4.2 2.5 3.5 3.7  < > = values in this interval not displayed.  Liver Function Tests:  Recent Labs Lab 01/14/15 1445 01/14/15 1806  01/16/15 1630 01/17/15 0418 01/17/15 1640 01/18/15 0501 01/19/15 0403  AST 38 56*  --   --   --   --  26  --   ALT 18 27  --   --   --   --  19  --   ALKPHOS 43 42  --   --   --   --  45  --   BILITOT 0.8 1.0  --   --   --   --  1.2  --   PROT 6.2* 5.7*  --   --   --   --  5.7*  --   ALBUMIN 3.8 3.3*  < > 3.4* 3.6 3.4* 3.3* 3.0*  < > = values in this interval not displayed. No results for input(s): LIPASE, AMYLASE in the last 168 hours. No results for input(s): AMMONIA in the last 168 hours.  CBC:  Recent Labs Lab 01/14/15 1445  01/15/15 0507 01/16/15 0420 01/17/15 0418 01/18/15 0501 01/19/15 0404  WBC 4.7  < > 8.7 9.9 10.7* 9.1 12.1*  NEUTROABS 4.2  --   --   --   --  8.4* 10.8*  HGB 8.7*  < > 7.9* 8.0* 8.6* 8.6* 8.5*  HCT 27.5*  < > 24.1* 24.2* 26.7* 26.6* 26.1*  MCV 96.8  < > 90.6 89.3 92.1 91.1 89.1  PLT 142*  < > 114* 88* 103* 103* 130*  < > = values in this interval not displayed.  Cardiac Enzymes:  Recent Labs Lab 01/14/15 1806  TROPONINI <0.03    BNP: BNP (last 3 results)  Recent Labs  01/14/15 1440  BNP 2160.6*    ProBNP (last 3 results)  Recent Labs  03/25/14 0918  PROBNP 284.0*     CBG:  Recent Labs Lab 01/18/15 1928 01/18/15 2347 01/19/15 0343 01/19/15 0804 01/19/15 1208  GLUCAP 291* 247* 307* 110* 223*    Coagulation Studies:  Recent Labs  01/17/15 0418  LABPROT 15.7*  INR 1.23    Other results: EKG: 01/17/15 NSR with sinus arrhythmia. 97 bpm  Imaging: Dg Chest Port 1 View  01/19/2015   CLINICAL DATA:  Renal failure.  EXAM: PORTABLE CHEST - 1 VIEW  COMPARISON:  01/17/2015.  FINDINGS: Endotracheal tube, left IJ line, NG tube in stable position. Heart size is stable. Bibasilar pulmonary infiltrates consistent pneumonia. Minimal interval improvement. No pleural effusion or  pneumothorax.  IMPRESSION: 1. Lines and tubes in stable position. 2. Bibasilar infiltrates consistent with pneumonia. Minimal interval improvement.   Electronically Signed   By:  Thomas  Register   On: 01/19/2015 07:21      Medications:     Current Medications: . amLODipine  10 mg Oral Daily  . antiseptic oral rinse  7 mL Mouth Rinse QID  . cefTRIAXone (ROCEPHIN)  IV  1 g Intravenous Q24H  . chlorhexidine gluconate  15 mL Mouth Rinse BID  . [START ON 01/20/2015] darbepoetin (ARANESP) injection - NON-DIALYSIS  100 mcg Subcutaneous Q Fri-1800  . feeding supplement (VITAL HIGH PROTEIN)  1,000 mL Per Tube Q24H  . furosemide  160 mg Intravenous 3 times per day  . heparin subcutaneous  5,000 Units Subcutaneous 3 times per day  . insulin aspart  0-20 Units Subcutaneous 6 times per day  . ipratropium-albuterol  3 mL Nebulization Q6H  . pantoprazole (PROTONIX) IV  40 mg Intravenous Q24H  . predniSONE  40 mg Oral Q breakfast     Infusions: . propofol (DIPRIVAN) infusion 15 mcg/kg/min (01/19/15 0312)      Assessment/Plan   Pamela [name] is a 64 y.o. female admitted on 01/14/15 with sepsis of unknown etiology after renal biopsy on 9/1 for diagnostic purposes.  Cardiology was consulted because Echo showed newly decreased EF.  1. Septic Shock - Reason for admission - From presumed UTI per CCM note. - On rocephin.  Blood and Urine cultures negative to date. UA negative 2. Acute renal failure on CKD Stage IV (Baseline Cr High 2s) - Required CVVHD this admission with Cr > 7.  Off as of 9/7. - Making urine currently (~30cc an hr) on 160 mg lasix q 8 hrs. - Nephrology following 3. Acute respiratory failure with COPD - Required short term intubation this admission.  Now on [place]. - On steroids and rocephin 4. Chronic systolic HF, EF 25-30%, ICM - Fluid status appears slightly elevated by JVP. Currently on 160 mg IV lasix q 8 hrs. - Reduced from 35-40% on echo 11/15 and cath 12/15 - Home meds  included coreg 25 mg TID, lasix 40 mg daily, and imdur 20 mg TID. - No ACE or ARB chronically with CKD. 5. CAD - Last cath 12/15. Severe disease first diagonal, medical treatment due to small caliber.  Widely patent LAD stent, non-critical circumflex and RC disease.  LVEF 35-40% - Had been on ASA 81 g and Lipitor 10 mg daily at home. 6. DM - Per primary team 7. Code status - LCB with no CPR and no cardioversion.  Length of Stay: 5  Graciella Freer PA-C 01/19/2015, 1:37 PM  Advanced Heart Failure Team Pager [phone] (M-F; 7a - 4p)  Please contact [facility] for night-coverage after hours (4p -7a ) and weekends on [email or web address]  Patient seen with PA, agree with the above note.  1. AKI on CKD IV: In setting of infection/sepsis.  She was on CVVH, now off.  UOP somewhat marginal, CVP 5.  2. ID: Septic shock initially, uncertain source.  Still with fever to 101 earlier today.  Had Enterobacter in sputum.  UA concerning for UTI but urine cultures negative.  Cannot rule out aortic valve vegetation on echo.  - Continue ceftriaxone.   - Given ?vegetation on echo, would plan TEE (tomorrow if there is availability).  3. Cardiomyopathy: EF 25-30%, down from 35-40%.  Had known ischemic cardiomyopathy.  She is tachycardic today, likely in setting of being off her Coreg (was on high dose at home). She is not volume overloaded on exam though her UOP has not been vigorous since CVVH was stopped.  No chest pain, initial troponin negative.  It is possible that she has a stress/sepsis cardiomyopathy superimposed on her pre-existing ischemic cardiomyopathy. - Will need repeat echo after she has recovered from this event to look for improvement.  - Stop amlodipine.  Start back on Coreg at 3.125 mg bid and titrate back up.  Can use Bidil 1 tab tid (BP runs relatively high).  4. CAD: See above, think this is stable.  5. COPD: Baseline significant exertional dyspnea.   Marca Ancona 01/19/2015 4:34 PM

## 2015-01-20 NOTE — Progress Notes (Signed)
Patient in bed watching television with family at the bedside. Currently patient complains of no pain or discomfort. Foley catheter intact and working properly. Medications given. Will continue to monitor patient to end of shift.

## 2015-01-20 NOTE — Progress Notes (Signed)
Patient to go for TEE at the beginning of the shift. Transport came while receiving report.  Quickly assessed patient before sending her off.  Patient stable with no s/s of distress or pain.  Family member at bedside.  Will do complete assessment upon returning to unit.

## 2015-01-21 ENCOUNTER — Inpatient Hospital Stay (HOSPITAL_COMMUNITY): Payer: Medicare Other

## 2015-01-21 DIAGNOSIS — I10 Essential (primary) hypertension: Secondary | ICD-10-CM

## 2015-01-21 DIAGNOSIS — J410 Simple chronic bronchitis: Secondary | ICD-10-CM

## 2015-01-21 DIAGNOSIS — A419 Sepsis, unspecified organism: Secondary | ICD-10-CM | POA: Diagnosis not present

## 2015-01-21 DIAGNOSIS — I5022 Chronic systolic (congestive) heart failure: Secondary | ICD-10-CM | POA: Insufficient documentation

## 2015-01-21 DIAGNOSIS — E785 Hyperlipidemia, unspecified: Secondary | ICD-10-CM

## 2015-01-21 DIAGNOSIS — N170 Acute kidney failure with tubular necrosis: Secondary | ICD-10-CM

## 2015-01-21 LAB — URINE MICROSCOPIC-ADD ON

## 2015-01-21 LAB — GLUCOSE, CAPILLARY
GLUCOSE-CAPILLARY: 142 mg/dL — AB (ref 65–99)
GLUCOSE-CAPILLARY: 213 mg/dL — AB (ref 65–99)
GLUCOSE-CAPILLARY: 140 mg/dL — AB (ref 65–99)
Glucose-Capillary: 208 mg/dL — ABNORMAL HIGH (ref 65–99)
Glucose-Capillary: 116 mg/dL — ABNORMAL HIGH (ref 65–99)
Glucose-Capillary: 248 mg/dL — ABNORMAL HIGH (ref 65–99)

## 2015-01-21 LAB — SODIUM, URINE, RANDOM: SODIUM UR: 83 mmol/L

## 2015-01-21 LAB — URINALYSIS, ROUTINE W REFLEX MICROSCOPIC
BILIRUBIN URINE: NEGATIVE
Glucose, UA: NEGATIVE mg/dL
KETONES UR: NEGATIVE mg/dL
NITRITE: NEGATIVE
PH: 5.5 (ref 5.0–8.0)
PROTEIN: 100 mg/dL — AB
Specific Gravity, Urine: 1.007 (ref 1.005–1.030)
UROBILINOGEN UA: 0.2 mg/dL (ref 0.0–1.0)

## 2015-01-21 LAB — HEPATITIS B SURFACE ANTIGEN: HEP B S AG: NEGATIVE

## 2015-01-21 LAB — OSMOLALITY: Osmolality: 297 mOsm/kg (ref 275–300)

## 2015-01-21 LAB — CREATININE, URINE, RANDOM: CREATININE, URINE: 29.32 mg/dL

## 2015-01-21 LAB — RENAL FUNCTION PANEL
ALBUMIN: 2.7 g/dL — AB (ref 3.5–5.0)
Anion gap: 8 (ref 5–15)
BUN: 56 mg/dL — AB (ref 6–20)
CALCIUM: 7.9 mg/dL — AB (ref 8.9–10.3)
CO2: 28 mmol/L (ref 22–32)
CREATININE: 4.46 mg/dL — AB (ref 0.44–1.00)
Chloride: 92 mmol/L — ABNORMAL LOW (ref 101–111)
GFR calc Af Amer: 11 mL/min — ABNORMAL LOW (ref >60)
GFR, EST NON AFRICAN AMERICAN: 10 mL/min — AB (ref >60)
GLUCOSE: 168 mg/dL — AB (ref 65–99)
PHOSPHORUS: 3.7 mg/dL (ref 2.5–4.6)
POTASSIUM: 3.6 mmol/L (ref 3.5–5.1)
SODIUM: 128 mmol/L — AB (ref 135–145)

## 2015-01-21 LAB — PROCALCITONIN: PROCALCITONIN: 0.22 ng/mL

## 2015-01-21 LAB — OSMOLALITY, URINE: Osmolality, Ur: 241 mOsm/kg — ABNORMAL LOW (ref 390–1090)

## 2015-01-21 LAB — URIC ACID: Uric Acid, Serum: 5.7 mg/dL (ref 2.3–6.6)

## 2015-01-21 LAB — MAGNESIUM: MAGNESIUM: 2.2 mg/dL (ref 1.7–2.4)

## 2015-01-21 MED ORDER — SIMVASTATIN 10 MG PO TABS: 10.0000 mg | ORAL_TABLET | Freq: Every day | ORAL | Status: DC

## 2015-01-21 MED ORDER — INSULIN ASPART 100 UNIT/ML ~~LOC~~ SOLN
0.0000 [IU] | Freq: Three times a day (TID) | SUBCUTANEOUS | Status: DC
  Administered 2015-01-21: 1 [IU] via SUBCUTANEOUS
  Administered 2015-01-22: 2 [IU] via SUBCUTANEOUS
  Administered 2015-01-22 (×2): 7 [IU] via SUBCUTANEOUS
  Administered 2015-01-23: 3 [IU] via SUBCUTANEOUS
  Administered 2015-01-23 – 2015-01-24 (×2): 5 [IU] via SUBCUTANEOUS
  Administered 2015-01-24: 2 [IU] via SUBCUTANEOUS
  Administered 2015-01-24: 1 [IU] via SUBCUTANEOUS
  Administered 2015-01-26: 7 [IU] via SUBCUTANEOUS
  Administered 2015-01-26: 2 [IU] via SUBCUTANEOUS
  Administered 2015-01-26: 3 [IU] via SUBCUTANEOUS
  Administered 2015-01-27: 5 [IU] via SUBCUTANEOUS
  Administered 2015-01-27 – 2015-01-29 (×6): 2 [IU] via SUBCUTANEOUS
  Administered 2015-01-29: 1 [IU] via SUBCUTANEOUS
  Administered 2015-01-30: 3 [IU] via SUBCUTANEOUS
  Administered 2015-01-30: 1 [IU] via SUBCUTANEOUS
  Administered 2015-01-30 – 2015-01-31 (×2): 2 [IU] via SUBCUTANEOUS
  Administered 2015-01-31: 7 [IU] via SUBCUTANEOUS
  Administered 2015-02-01: 3 [IU] via SUBCUTANEOUS
  Administered 2015-02-01: 2 [IU] via SUBCUTANEOUS

## 2015-01-21 MED ORDER — ATORVASTATIN CALCIUM 10 MG PO TABS
10.0000 mg | ORAL_TABLET | Freq: Every day | ORAL | Status: DC
  Administered 2015-01-21 – 2015-02-01 (×11): 10 mg via ORAL
  Filled 2015-01-21 (×12): qty 1

## 2015-01-21 MED ORDER — INSULIN ASPART 100 UNIT/ML ~~LOC~~ SOLN
0.0000 [IU] | Freq: Every day | SUBCUTANEOUS | Status: DC
  Administered 2015-01-21 – 2015-01-25 (×2): 2 [IU] via SUBCUTANEOUS
  Administered 2015-01-26: 3 [IU] via SUBCUTANEOUS
  Administered 2015-01-27 – 2015-01-31 (×3): 2 [IU] via SUBCUTANEOUS

## 2015-01-21 MED ORDER — ACETAMINOPHEN 500 MG PO TABS
500.0000 mg | ORAL_TABLET | Freq: Four times a day (QID) | ORAL | Status: DC | PRN
  Administered 2015-01-21 – 2015-01-28 (×2): 500 mg via ORAL
  Filled 2015-01-21 (×2): qty 1

## 2015-01-21 MED ORDER — ASPIRIN 81 MG PO CHEW
81.0000 mg | CHEWABLE_TABLET | Freq: Every day | ORAL | Status: DC
  Administered 2015-01-21 – 2015-02-01 (×10): 81 mg via ORAL
  Filled 2015-01-21 (×11): qty 1

## 2015-01-21 NOTE — Progress Notes (Addendum)
Patient Demographics:    Pamela [name], is a 64 y.o. female, DOB - 08/12/1950, NWG:[id number]  Admit date - 01/14/2015   Admitting Physician Alyson Reedy, MD  Outpatient Primary MD for the patient is Kristie Cowman, MD  LOS - 7   Chief Complaint  Patient presents with  . Fatigue       Brief summary  This is a pleasant 64 year old Caucasian female with history of hypertensive nephrosclerosis with CK D stage III under the care of Dr. Kathrene Bongo, understandable CAD with chronic combined diastolic and systolic heart failure EF around 30%, mild AS, essential hypertension, dyslipidemia who underwent an outpatient kidney biopsy on 01/12/2015 thereafter presented to the ER on 01/14/2015 for decreased mentation due to uremia, she was found to be in acute renal failure with hyperkalemia, developed respiratory failure due to combination of fluid overload along with failure to protect airway due to uremia. She was intubated and kept in ICU. She was seen by cardiology and nephrology. Right femoral Vas-Cath was placed and she was started on dialysis this admission. She underwent TTE along with TEE both confirming severe combined systolic and diastolic CHF however no evidence of endocarditis.  She was stabilized and transferred to hospitalist service under my care on day 7 of her hospital stay date 01/21/2015.    Subjective:    Pamela [name] today has, No headache, No chest pain, No abdominal pain - No Nausea, No new weakness tingling or numbness, No Cough - SOB.     Assessment  & Plan :     1. Acute hypoxic respiratory failure due to combination of toxic encephalopathy failure to protect airway, fluid overload. Requiring intubation and extubation 2, finally extubated on 01/20/2015. Pulmonary status appears  stable. No evidence of pneumonia. Mentation has improved. Try to titrate off oxygen, supportive care with negative Theora Gianotti treatments as needed.  2. ARF on CK D stage III. Underlying problem thought to be hypertensive nephrosclerosis, renal on board, being dialyzed during this admission why a right femoral Vas-Cath. Defer management of this problem to nephrology.  3. Acute on chronic combined diastolic and systolic heart failure with ischemic cardiomyopathy. EF 30%. Underwent TTE and TEE, cardiology on board, fluid removal via dialysis, continue Coreg along with BiDil. Has understandable CAD. Will add low-dose aspirin as well.  4. Questionable sepsis. No clear source, all cultures negative, UA was clear, chest x-ray was clear, one of her sputum cultures grew Enterobacter cloacae, currently tapered down to Rocephin. Will monitor temperature curve and CBC, repeat 2 view chest x-ray. If stable we'll discontinue Rocephin soon.  5. History of COPD. No wheezing, nebulizer treatments as needed, try to titrate off oxygen.   6. Toxic encephalopathy due to uremia. Resolved plan as in #1 above.   7. Dyslipidemia. Resume home dose statin.   8. Left cephalic vein SVT noted on ultrasound done in ICU on 01/18/2015. Supportive care. Discussed with vascular surgeon on call Dr. Cherly Hensen. No further evaluation or treatment for SVT. No line in left arm.    9. DM type II. Check A1c place on sliding scale for now. Avoid oral hypoglycemics for now.  No results found for: HGBA1C  CBG (last 3)   Recent Labs  01/21/15 0542 01/21/15 0758 01/21/15 1137  GLUCAP 116* 142* 213*      Code Status : Partial  Family Communication  : Husband bedside  Disposition Plan  : Be decided  Consults  :  PCCM, renal, cardiology  Procedures  :   9/3 intubated and renal consult called. 9/5- neg 2.2 liters, cvvhd 9/6- increase urine output, ETT placed 9/7 doppler>>>neg 9/7 echo>>>25-30%, pa 40, thick aortic 9/8-  extubated 9/9 tee>>>severely depressed, AV with very mild calcification. No obvious vegetation  9/9 - urine output increased 9/7 - Doppler obtained in ICU positive for left cephalic vein SVT.   DVT Prophylaxis  :  Heparin   Lab Results  Component Value Date   PLT 130* 01/19/2015    Inpatient Medications  Scheduled Meds: . antiseptic oral rinse  7 mL Mouth Rinse BID  . carvedilol  6.25 mg Oral BID WC  . cefTRIAXone (ROCEPHIN)  IV  1 g Intravenous Q24H  . darbepoetin (ARANESP) injection - NON-DIALYSIS  100 mcg Subcutaneous Q Fri-1800  . heparin subcutaneous  5,000 Units Subcutaneous 3 times per day  . insulin aspart  0-20 Units Subcutaneous 6 times per day  . isosorbide-hydrALAZINE  1 tablet Oral TID  . latanoprost  1 drop Both Eyes QHS  . pantoprazole (PROTONIX) IV  40 mg Intravenous Q24H  . [START ON 01/22/2015] predniSONE  10 mg Oral Once   Continuous Infusions:  PRN Meds:.acetaminophen (TYLENOL) oral liquid 160 mg/5 mL, ipratropium-albuterol  Antibiotics  :     Anti-infectives    Start     Dose/Rate Route Frequency Ordered Stop   01/17/15 1000  cefTRIAXone (ROCEPHIN) 1 g in dextrose 5 % 50 mL IVPB     1 g 100 mL/hr over 30 Minutes Intravenous Every 24 hours 01/17/15 0901 01/22/15 0959   01/15/15 1600  vancomycin (VANCOCIN) IVPB 750 mg/150 ml premix  Status:  Discontinued     750 mg 150 mL/hr over 60 Minutes Intravenous Every 24 hours 01/15/15 0851 01/16/15 0909   01/15/15 0100  piperacillin-tazobactam (ZOSYN) IVPB 2.25 g  Status:  Discontinued     2.25 g 100 mL/hr over 30 Minutes Intravenous Every 8 hours 01/14/15 1614 01/14/15 2152   01/14/15 2200  piperacillin-tazobactam (ZOSYN) IVPB 2.25 g  Status:  Discontinued     2.25 g 100 mL/hr over 30 Minutes Intravenous 4 times per day 01/14/15 2152 01/17/15 0901   01/14/15 1600  vancomycin (VANCOCIN) IVPB 1000 mg/200 mL premix     1,000 mg 200 mL/hr over 60 Minutes Intravenous  Once 01/14/15 1506 01/14/15 1708   01/14/15  1600  piperacillin-tazobactam (ZOSYN) IVPB 2.25 g  Status:  Discontinued     2.25 g 100 mL/hr over 30 Minutes Intravenous  Once 01/14/15 1506 01/14/15 1512   01/14/15 1512  piperacillin-tazobactam (ZOSYN) IVPB 3.375 g     3.375 g 100 mL/hr over 30 Minutes Intravenous  Once 01/14/15 1512 01/14/15 1624        Objective:   Filed Vitals:   01/20/15 1952 01/20/15 2343 01/21/15 0540 01/21/15 0822  BP: 126/64 118/58 114/62 128/55  Pulse: 89 87 88 103  Temp: 98.7 F (37.1 C)  98 F (36.7 C) 98.3 F (36.8 C)  TempSrc: Oral  Oral Oral  Resp: Weight:   52.617 kg (116 lb)   SpO2: 98% 98% 98% 100%    Wt Readings from Last 3 Encounters:  01/21/15 52.617 kg (116 lb)  01/12/15 50.803 kg (112 lb)  10/24/14 51.982 kg (114 lb 9.6  oz)     Intake/Output Summary (Last 24 hours) at 01/21/15 1142 Last data filed at 01/21/15 1019  Gross per 24 hour  Intake    600 ml  Output    601 ml  Net     -1 ml     Physical Exam  Awake Alert, Oriented X 3, No new F.N deficits, Normal affect [place].AT,PERRAL Supple Neck,No JVD, No cervical lymphadenopathy appriciated.  Symmetrical Chest wall movement, Good air movement bilaterally, CTAB RRR,No Gallops,Rubs or new Murmurs, No Parasternal Heave +ve B.Sounds, Abd Soft, No tenderness, No organomegaly appriciated, No rebound - guarding or rigidity. No Cyanosis, Clubbing or edema, No new Rash or bruise    Right femoral Vas-Cath. Foley catheter in place.   Data Review:   Micro Results Recent Results (from the past 240 hour(s))  Culture, blood (routine x 2)     Status: None   Collection Time: 01/14/15  2:45 PM  Result Value Ref Range Status   Specimen Description BLOOD LEFT WRIST  Final   Special Requests BOTTLES DRAWN AEROBIC AND ANAEROBIC 5CC  Final   Culture NO GROWTH 5 DAYS  Final   Report Status 01/19/2015 FINAL  Final  Culture, blood (routine x 2)     Status: None   Collection Time: 01/14/15  2:55 PM  Result Value Ref Range Status    Specimen Description BLOOD RIGHT HAND  Final   Special Requests BOTTLES DRAWN AEROBIC AND ANAEROBIC  Final   Culture NO GROWTH 5 DAYS  Final   Report Status 01/19/2015 FINAL  Final  MRSA PCR Screening     Status: None   Collection Time: 01/14/15  6:00 PM  Result Value Ref Range Status   MRSA by PCR NEGATIVE NEGATIVE Final    Comment:        The GeneXpert MRSA Assay (FDA approved for NASAL specimens only), is one component of a comprehensive MRSA colonization surveillance program. It is not intended to diagnose MRSA infection nor to guide or monitor treatment for MRSA infections.   Culture, respiratory (NON-Expectorated)     Status: None   Collection Time: 01/14/15  6:03 PM  Result Value Ref Range Status   Specimen Description TRACHEAL ASPIRATE  Final   Special Requests NONE  Final   Gram Stain   Final    NO WBC SEEN NO SQUAMOUS EPITHELIAL CELLS SEEN NO ORGANISMS SEEN Performed at [facility]    Culture   Final    FEW ENTEROBACTER CLOACAE Performed at [facility]    Report Status 01/16/2015 FINAL  Final   Organism ID, Bacteria ENTEROBACTER CLOACAE  Final      Susceptibility   Enterobacter cloacae - MIC*    CEFAZOLIN >=64 RESISTANT Resistant     CEFEPIME <=1 SENSITIVE Sensitive     CEFTAZIDIME <=1 SENSITIVE Sensitive     CEFTRIAXONE <=1 SENSITIVE Sensitive     CIPROFLOXACIN <=0.25 SENSITIVE Sensitive     GENTAMICIN <=1 SENSITIVE Sensitive     IMIPENEM 0.5 SENSITIVE Sensitive     PIP/TAZO <=4 SENSITIVE Sensitive     TOBRAMYCIN <=1 SENSITIVE Sensitive     TRIMETH/SULFA <=20 SENSITIVE Sensitive     * FEW ENTEROBACTER CLOACAE  Urine culture     Status: None   Collection Time: 01/15/15 10:05 PM  Result Value Ref Range Status   Specimen Description URINE, CATHETERIZED  Final   Special Requests NONE  Final   Culture NO GROWTH 2 DAYS  Final   Report Status  01/17/2015 FINAL  Final  Culture, blood (routine x 2)     Status: None (Preliminary result)    Collection Time: 01/18/15  4:40 PM  Result Value Ref Range Status   Specimen Description BLOOD RIGHT ARM  Final   Special Requests BOTTLES DRAWN AEROBIC ONLY 5CC  Final   Culture NO GROWTH 3 DAYS  Final   Report Status PENDING  Incomplete  Culture, blood (routine x 2)     Status: None (Preliminary result)   Collection Time: 01/18/15  4:45 PM  Result Value Ref Range Status   Specimen Description BLOOD LEFT ARM  Final   Special Requests BOTTLES DRAWN AEROBIC ONLY 5CC  Final   Culture NO GROWTH 3 DAYS  Final   Report Status PENDING  Incomplete    Radiology Reports US Renal  01/14/2015   CLINICAL DATA:  Renal failure. History of hypertension, COPD, diabetes.  EXAM: RENAL / URINARY TRACT ULTRASOUND COMPLETE  COMPARISON:  10/21/2014  FINDINGS: Right Kidney:  Length: 9.9 cm. Renal parenchyma is echogenic. Multiple small cysts are identified. The largest is seen in the lower pole region measuring 1.1 cm. No hydronephrosis.  Left Kidney:  Length: 10.2 cm. Echogenic renal parenchyma. Multiple small cysts are identified, largest in the lower pole region which measures 1.3 cm.  Bladder:  A Foley catheter decompresses the bladder.  Additional:  Note is made of a small amount of ascites.  IMPRESSION: 1. Echogenic kidneys bilaterally.  Bilateral renal cysts. 2. No hydronephrosis. 3. Small amount of ascites noted.   Electronically Signed   By: Norva Pavlov M.D.   On: 01/14/2015 17:44   US Biopsy  01/12/2015   INDICATION: Proteinuria of uncertain etiology. Please perform ultrasound-guided biopsy for tissue diagnostic purposes.  EXAM: ULTRASOUND GUIDED RENAL BIOPSY  COMPARISON:  Renal ultrasound - 10/21/2014  MEDICATIONS: Fentanyl 50 mcg IV; Versed 1 mg IV  ANESTHESIA/SEDATION: Total Moderate Sedation time  15 minutes  COMPLICATIONS: SIR level A: No therapy, no consequence.  Procedure complicated by development of a tiny, asymptomatic, non enlarging left-sided perinephric hematoma.  PROCEDURE: Informed written  consent was obtained from the patient after a discussion of the risks, benefits and alternatives to treatment. The patient understands and consents the procedure. A timeout was performed prior to the initiation of the procedure.  Ultrasound scanning was performed of the bilateral flanks. The inferior pole of the left kidney was selected for biopsy due to location and sonographic window. The procedure was planned. The operative site was prepped and draped in the usual sterile fashion. The overlying soft tissues were anesthetized with 1% lidocaine with epinephrine. A 17 gauge core needle biopsy device was advanced into the inferior cortex of the left kidney and 3 core biopsies were obtained under direct ultrasound guidance. Real time pathologic review confirmed adequate tissue acquisition. Images were saved for documentation purposes. The biopsy device was removed and hemostasis was obtained with manual compression.  Postprocedural scanning and demonstrated development of a very tiny perinephric hematoma which was noted to not enlarged on the acquired delayed postprocedural imaging (representative images 8 and 9). Post procedural scanning was negative for significant post procedural hemorrhage or additional complication. A dressing was placed. The patient tolerated the procedure well without immediate post procedural complication.  IMPRESSION: Technically successful ultrasound guided left renal biopsy.   Electronically Signed   By: Simonne Come M.D.   On: 01/12/2015 13:57   Dg Chest Port 1 View  01/19/2015   CLINICAL DATA:  Renal failure.  EXAM:  PORTABLE CHEST - 1 VIEW  COMPARISON:  01/17/2015.  FINDINGS: Endotracheal tube, left IJ line, NG tube in stable position. Heart size is stable. Bibasilar pulmonary infiltrates consistent pneumonia. Minimal interval improvement. No pleural effusion or pneumothorax.  IMPRESSION: 1. Lines and tubes in stable position. 2. Bibasilar infiltrates consistent with pneumonia. Minimal  interval improvement.   Electronically Signed   By: Maisie Fus  Register   On: 01/19/2015 07:21   Dg Chest Port 1 View  01/17/2015   CLINICAL DATA:  Ventilator dependent respiratory failure. Followup left basilar atelectasis.  EXAM: PORTABLE CHEST - 1 VIEW  COMPARISON:  01/15/2015 and earlier.  FINDINGS: Endotracheal tube tip in satisfactory position approximately 4 cm above the carina. Nasogastric tube courses below the diaphragm into the stomach though its tip is not included on the image. Left jugular central venous catheter tip projects over the mid to lower SVC.  Cardiac silhouette normal in size, unchanged. Airspace opacities involving the lung bases bilaterally, right greater than left, progressive since the examination 2 days ago. Pulmonary vascularity normal. Possible small bilateral pleural effusions.  IMPRESSION: 1. Support apparatus satisfactory. 2. Pneumonia involving the lung bases, right greater than left, progressive since the examination 2 days ago. 3. Possible small bilateral pleural effusions.   Electronically Signed   By: Hulan Saas M.D.   On: 01/17/2015 11:28   Dg Chest Port 1 View  01/15/2015   CLINICAL DATA:  Septic shock, respiratory failure and acute renal failure. Question left lower lobe pneumonia.  EXAM: PORTABLE CHEST - 1 VIEW  COMPARISON:  01/14/2015  FINDINGS: Endotracheal tube with tip 3.2 cm above the carina, left IJ central venous catheter with tip overlying the superior cavoatrial junction NG tube entering the stomach with tip off the field of view noted.  Left lower lung opacity has improved.  There is no evidence of pleural effusion or pneumothorax.  IMPRESSION: Improved left lower lung opacity, suggesting atelectasis.  No other significant change.   Electronically Signed   By: Harmon Pier M.D.   On: 01/15/2015 07:32   Dg Chest Port 1 View  01/14/2015   CLINICAL DATA:  Septic shock, respiratory failure and acute renal failure. Endotracheal tube and central line placement.   EXAM: PORTABLE CHEST - 1 VIEW  COMPARISON:  01/14/2015 and prior radiographs  FINDINGS: The cardiomediastinal silhouette is unchanged.  An endotracheal tube with tip 5.1 cm above the carina, left IJ central venous catheter with tip overlying the lower SVC and NG tube entering the stomach with tip off the field of view are noted.  Left lower lung airspace disease is present.  There is no evidence of pneumothorax or pleural effusion.  IMPRESSION: Left lower lung airspace disease/ pneumonia.  Support apparatus as described.  No evidence of pneumothorax.   Electronically Signed   By: Harmon Pier M.D.   On: 01/14/2015 16:38   Dg Chest Portable 1 View  01/14/2015   CLINICAL DATA:  Sepsis.  EXAM: PORTABLE CHEST - 1 VIEW  COMPARISON:  03/25/2014.  FINDINGS: Interval borderline enlargement of the cardiac silhouette and mild increase in prominence of the interstitial markings, accentuated by a decreased inspiration. Calcified granuloma in the right lower lung zone. No pleural fluid. Diffuse osteopenia.  IMPRESSION: Interval borderline cardiomegaly and mildly increased prominence of the interstitial markings, most likely due to a poor inspiration. No gross acute abnormality.   Electronically Signed   By: Beckie Salts M.D.   On: 01/14/2015 15:21   Dg Abd Portable 1v  01/17/2015  CLINICAL DATA:  Nasogastric tube placement  EXAM: PORTABLE ABDOMEN - 1 VIEW  COMPARISON:  None.  FINDINGS: Orogastric tube with the tip projecting over the antrum of the stomach. There is no bowel dilatation to suggest obstruction. There is no evidence of pneumoperitoneum, portal venous gas or pneumatosis. There are no pathologic calcifications along the expected course of the ureters.The osseous structures are unremarkable.  IMPRESSION: Orogastric tube with the tip projecting over the antrum of the stomach.   Electronically Signed   By: Elige Ko   On: 01/17/2015 11:19     CBC  Recent Labs Lab 01/14/15 1445  01/15/15 0507  01/16/15 0420 01/17/15 0418 01/18/15 0501 01/19/15 0404  WBC 4.7  < > 8.7 9.9 10.7* 9.1 12.1*  HGB 8.7*  < > 7.9* 8.0* 8.6* 8.6* 8.5*  HCT 27.5*  < > 24.1* 24.2* 26.7* 26.6* 26.1*  PLT 142*  < > 114* 88* 103* 103* 130*  MCV 96.8  < > 90.6 89.3 92.1 91.1 89.1  MCH 30.6  < > 29.7 29.5 29.7 29.5 29.0  MCHC 31.6  < > 32.8 33.1 32.2 32.3 32.6  RDW 15.5  < > 15.5 16.2* 16.5* 16.0* 15.9*  LYMPHSABS 0.3*  --   --   --   --  0.4* 0.6*  MONOABS 0.2  --   --   --   --  0.3 0.7  EOSABS 0.0  --   --   --   --  0.0 0.0  BASOSABS 0.0  --   --   --   --  0.0 0.0  < > = values in this interval not displayed.  Chemistries   Recent Labs Lab 01/14/15 1445  01/14/15 1806  01/17/15 1640  01/18/15 0501  01/19/15 0403 01/19/15 0917 01/19/15 2207 01/20/15 0548 01/21/15 0347  NA 130*  < > 131*  < > 136  --  134*  --  133*  --   --  136 128*  K 6.6*  < > 6.3*  < > 3.5  --  3.6  --  3.0*  --   --  3.3* 3.6  CL 94*  < > 95*  < > 89*  --  93*  --  93*  --   --  95* 92*  CO2 6*  --  <5*  < > 32  --  27  --  28  --   --  29 28  GLUCOSE 231*  < > 262*  < > 138*  --  206*  --  266*  --   --  173* 168*  BUN 130*  < > 117*  < > 6  --  15  --  39*  --   --  55* 56*  CREATININE 7.60*  < > 7.09*  < > 1.34*  --  1.54*  --  2.48*  --   --  3.76* 4.46*  CALCIUM 8.4*  --  8.5*  < > 7.5*  --  7.8*  --  8.2*  --   --  8.3* 7.9*  MG  --   --   --   < >  --   < > 2.3  < > 2.3 2.3 2.2 2.3 2.2  AST 38  --  56*  --   --   --  26  --   --   --   --   --   --   ALT 18  --  27  --   --   --  19  --   --   --   --   --   --   ALKPHOS 43  --  42  --   --   --  45  --   --   --   --   --   --   BILITOT 0.8  --  1.0  --   --   --  1.2  --   --   --   --   --   --   < > = values in this interval not displayed. ------------------------------------------------------------------------------------------------------------------ estimated creatinine clearance is 9.6 mL/min (by C-G formula based on Cr of  4.46). ------------------------------------------------------------------------------------------------------------------ No results for input(s): HGBA1C in the last 72 hours. ------------------------------------------------------------------------------------------------------------------  Recent Labs  01/20/15 0908  TRIG 102   ------------------------------------------------------------------------------------------------------------------ No results for input(s): TSH, T4TOTAL, T3FREE, THYROIDAB in the last 72 hours.  Invalid input(s): FREET3 ------------------------------------------------------------------------------------------------------------------ No results for input(s): VITAMINB12, FOLATE, FERRITIN, TIBC, IRON, RETICCTPCT in the last 72 hours.  Coagulation profile  Recent Labs Lab 01/14/15 1806 01/17/15 0418  INR 1.92* 1.23    No results for input(s): DDIMER in the last 72 hours.  Cardiac Enzymes  Recent Labs Lab 01/14/15 1806  TROPONINI <0.03   ------------------------------------------------------------------------------------------------------------------ Invalid input(s): POCBNP   Time Spent in minutes   35   Kyndal Heringer K M.D on 01/21/2015 at 11:42 AM  Between 7am to 7pm - Pager - [phone]  After 7pm go to [email or web address] - password [facility]  [facility] -  Office  [phone]

## 2015-01-21 NOTE — Evaluation (Signed)
Clinical/Bedside Swallow Evaluation Patient Details  Name: Pamela [name] MRN: [id number] Date of Birth: [birth year]  Today's Date: 01/21/2015 Time: SLP Start Time (ACUTE ONLY): 1013 SLP Stop Time (ACUTE ONLY): 1036 SLP Time Calculation (min) (ACUTE ONLY): 23 min  Past Medical History:  Past Medical History  Diagnosis Date  . PONV (postoperative nausea and vomiting)   . Hypertension   . COPD (chronic obstructive pulmonary disease)   . Diabetes mellitus without complication 10-29-12  . Coronary artery disease   . Shortness of breath dyspnea   . Anxiety   . Chronic kidney disease    Past Surgical History:  Past Surgical History  Procedure Laterality Date  . Abdominal hysterectomy    . Elbow surgery Right     tendon surgery  . Tubal ligation    . Breast surgery      cyst removed  . Cholecystectomy      '74-open  . Appendectomy      '74- open with gallbladder  . Cardiac catheterization      1 coronary stent placed  . Ganglion cyst excision Bilateral 10-29-12    wrist  . Anal fissure repair    . Colonoscopy with propofol N/A 11/17/2012    Procedure: COLONOSCOPY WITH PROPOFOL;  Surgeon: Charolett Bumpers, MD;  Location: [facility] ENDOSCOPY;  Service: Endoscopy;  Laterality: N/A;  . Esophagogastroduodenoscopy (egd) with propofol N/A 11/17/2012    Procedure: ESOPHAGOGASTRODUODENOSCOPY (EGD) WITH PROPOFOL;  Surgeon: Charolett Bumpers, MD;  Location: [facility];  Service: Endoscopy;  Laterality: N/A;  . Left heart catheterization with coronary angiogram N/A 04/14/2014    Procedure: LEFT HEART CATHETERIZATION WITH CORONARY ANGIOGRAM;  Surgeon: Lesleigh Noe, MD;  Location: [facility] CATH LAB;  Service: Cardiovascular;  Laterality: N/A;   HPI:  Pt is a 64 year old female with PMH of stage 3 kidney disease who had a biopsy done 9/1 of her left kidney. For 24 hours prior to presentation continued to deteriorate from a mental status standpoint but LOC on 9/3 and was brought to the ED. In the ED  patient was noted to be in renal failure, hyperkalemic, acidotic and in respiratory failure due to AMS. 9/3 intubated and renal consult called. Extubated 9-4. ST initial swallow consult recommended dysphagia 3, thin. Pt was intubated again 9-6 for 2 days. ST to re evaluate swallow function following second intubation. Recent chest x ray signfiicant for bibasilar infiltrates consistent with PNA    Assessment / Plan / Recommendation Clinical Impression  Pts swallow appearing functional and timely. Pt however does have multiple risk factors for aspiration risk including recent intubation, decreased respiratory functioning, and current chest x ray concerning PNA with bibasilar infiltrates. No overt signs or symtptoms of aspiration this date except very mildly wet vocal quality after initial swallow of thin liquids. Vocal quality cleared with pts reflexive additional swallow. Recommend regular thin liquid diet. Additional swallow follow up recommended to observe PO during meal given multiple risk factors and current onset of PNA.      Aspiration Risk  Moderate    Diet Recommendation Age appropriate regular solids;Thin   Medication Administration: Whole meds with liquid Compensations: Slow rate;Small sips/bites    Other  Recommendations Oral Care Recommendations: Oral care BID   Follow Up Recommendations       Frequency and Duration min 1 x/week  1 week   Pertinent Vitals/Pain     SLP Swallow Goals     Swallow Study Prior Functional Status  General Other Pertinent Information: Pt is a 64 year old female with PMH of stage 3 kidney disease who had a biopsy done 9/1 of her left kidney. For 24 hours prior to presentation continued to deteriorate from a mental status standpoint but LOC on 9/3 and was brought to the ED. In the ED patient was noted to be in renal failure, hyperkalemic, acidotic and in respiratory failure due to AMS. 9/3 intubated and renal consult called. Extubated 9-4. ST  initial swallow consult recommended dysphagia 3, thin. Pt was intubated again 9-6 for 2 days. ST to re evaluate swallow function following second intubation. Recent chest x ray signfiicant for bibasilar infiltrates consistent with PNA  Type of Study: Bedside swallow evaluation Diet Prior to this Study: Regular;Thin liquids Temperature Spikes Noted: Yes Respiratory Status: Supplemental O2 delivered via (comment) History of Recent Intubation: Yes Length of Intubations (days):  (3 days total for 2 periods of intubation ) Date extubated:  (01-19-15) Behavior/Cognition: Alert;Cooperative;Pleasant mood Oral Cavity - Dentition: Edentulous;Other (Comment) (upper and lower dentures ) Self-Feeding Abilities: Able to feed self Patient Positioning: Upright in chair/Tumbleform Baseline Vocal Quality: Low vocal intensity Volitional Cough: Weak;Congested Volitional Swallow: Able to elicit    Oral/Motor/Sensory Function Overall Oral Motor/Sensory Function: Appears within functional limits for tasks assessed Labial ROM: Within Functional Limits   Ice Chips Ice chips: Within functional limits Presentation: Spoon   Thin Liquid Thin Liquid: Within functional limits Presentation: Cup;Straw;Self Fed    Nectar Thick Nectar Thick Liquid: Not tested   Honey Thick Honey Thick Liquid: Not tested   Puree Puree: Within functional limits Presentation: Spoon;Self Fed   Solid   GO    Solid: Within functional limits Presentation: Self Fed      Marcene Duos MA, CCC-SLP Acute Care Speech Language Pathologist    [place], Dhanya Bogle E 01/21/2015,10:40 AM

## 2015-01-21 NOTE — Progress Notes (Signed)
Subjective:  Moved to floor bed - looks great up in chair- wants to eat.  Making urine but creatinine up daily   Objective Vital signs in last 24 hours: Filed Vitals:   01/20/15 1952 01/20/15 2343 01/21/15 0540 01/21/15 0822  BP: 126/64 118/58 114/62 128/55  Pulse: 89 87 88 103  Temp: 98.7 F (37.1 C)  98 F (36.7 C) 98.3 F (36.8 C)  TempSrc: Oral  Oral Oral  Resp: 18 18 18 18   Weight:   52.617 kg (116 lb)   SpO2: 98% 98% 98% 100%   Weight change:   Intake/Output Summary (Last 24 hours) at 01/21/15 0930 Last data filed at 01/21/15 0156  Gross per 24 hour  Intake    970 ml  Output    751 ml  Net    219 ml    Assessment/ Plan: Pt is Pamela [name] 64 y.o. yo female who was admitted on 01/14/2015 with sepsis of unknown etiology after renal biopsy for diagnostic purposes  Assessment/Plan: 1. Renal- CKD as OP- crt climbing to over 3- s/p renal bx 9/1 for diagnostic purposes-  results just c/w hypertensive nephrosclerosis.  Now with Pamela [name] on CRF in the setting of presumed sepsis but of unknown source requiring CRRT  9/3-9/7.   Clinically improved making some urine- creatinine went up again now mid 4's with baseline near 3.  Has femoral vascath so will do brief HD today for clearance only then get vascath out.  Not sure if this event will make dialysis requiring from now.  Will need to continue to assess fur further HD need the next few days 2. sepsis- no positive cultures- rocephin 3. Anemia- low but stable- on ESA as OP- getting here 4. Secondary hyperparathyroidism- last PTH 102- no vit D- phos 3.7 5. HTN/volume- some volume overload-  pressure is better- on  norvasc and coreg- also determined to have poor EF 6. VDRF- had to be re intubated mostly for COPD which she has had for years- family thinks is doing so much better on O2- may need chronically  7. Acidosis- resolved  8. Hypokalemia- will run on high K bath today    Pamela Pamela [name]    Labs: Basic Metabolic Panel:  Recent Labs Lab  01/19/15 0403  01/19/15 2207 01/20/15 0548 01/21/15 0347  NA 133*  --   --  136 128*  K 3.0*  --   --  3.3* 3.6  CL 93*  --   --  95* 92*  CO2 28  --   --  29 28  GLUCOSE 266*  --   --  173* 168*  BUN 39*  --   --  55* 56*  CREATININE 2.48*  --   --  3.76* 4.46*  CALCIUM 8.2*  --   --  8.3* 7.9*  PHOS 3.5  < > 3.2 3.6 3.7  < > = values in this interval not displayed. Liver Function Tests:  Recent Labs Lab 01/14/15 1445 01/14/15 1806  01/18/15 0501 01/19/15 0403 01/20/15 0548 01/21/15 0347  AST 38 56*  --  26  --   --   --   ALT 18 27  --  19  --   --   --   ALKPHOS 43 42  --  45  --   --   --   BILITOT 0.8 1.0  --  1.2  --   --   --   PROT 6.2* 5.7*  --  5.7*  --   --   --  ALBUMIN 3.8 3.3*  < > 3.3* 3.0* 2.8* 2.7*  < > = values in this interval not displayed. No results for input(s): LIPASE, AMYLASE in the last 168 hours. No results for input(s): AMMONIA in the last 168 hours. CBC:  Recent Labs Lab 01/14/15 1445  01/15/15 0507 01/16/15 0420 01/17/15 0418 01/18/15 0501 01/19/15 0404  WBC 4.7  < > 8.7 9.9 10.7* 9.1 12.1*  NEUTROABS 4.2  --   --   --   --  8.4* 10.8*  HGB 8.7*  < > 7.9* 8.0* 8.6* 8.6* 8.5*  HCT 27.5*  < > 24.1* 24.2* 26.7* 26.6* 26.1*  MCV 96.8  < > 90.6 89.3 92.1 91.1 89.1  PLT 142*  < > 114* 88* 103* 103* 130*  < > = values in this interval not displayed. Cardiac Enzymes:  Recent Labs Lab 01/14/15 1806  TROPONINI <0.03   CBG:  Recent Labs Lab 01/20/15 1948 01/20/15 2341 01/21/15 0148 01/21/15 0542 01/21/15 0758  GLUCAP 171* 216* 208* 116* 142*    Iron Studies: No results for input(s): IRON, TIBC, TRANSFERRIN, FERRITIN in the last 72 hours. Studies/Results: No results found. Medications: Infusions:    Scheduled Medications: . antiseptic oral rinse  7 mL Mouth Rinse BID  . carvedilol  6.25 mg Oral BID WC  . cefTRIAXone (ROCEPHIN)  IV  1 g Intravenous Q24H  . darbepoetin (ARANESP) injection - NON-DIALYSIS  100 mcg  Subcutaneous Q Fri-1800  . heparin subcutaneous  5,000 Units Subcutaneous 3 times per day  . Influenza vac split quadrivalent PF  0.5 mL Intramuscular Tomorrow-1000  . insulin aspart  0-20 Units Subcutaneous 6 times per day  . isosorbide-hydrALAZINE  1 tablet Oral TID  . latanoprost  1 drop Both Eyes QHS  . pantoprazole (PROTONIX) IV  40 mg Intravenous Q24H  . [START ON 01/22/2015] predniSONE  10 mg Oral Once    have reviewed scheduled and prn medications.  Physical Exam: General: looks great- sitting up- multiple family members present Heart: RRR Lungs: mostly clear Abdomen: soft, non tender Extremities: no edema Dialysis Access: femoral vascath placed 9/3    01/21/2015,9:30 AM  LOS: 7 days

## 2015-01-21 NOTE — Progress Notes (Signed)
Patient ID: KEYLEI FIZER MRN: [id number] DOB/AGE: [birth year] 65 y.o.  Admit date: 01/14/2015 Primary Physician Kristie Cowman, MD Primary Cardiologist Dr. Verdis Prime  Patient ID: 64 y.o. female with history of CAD, DM, COPD, HTN, CKD stage IV, and hyperlipidemia here with hypoxic respiratory failure requiring intubation and sepsis from an unknown source, acute on chronic renal failure requiring HD, and acute on chronic systolic and diastolic heart failure.  Interval history: Ms. Sheely has tolerated 3 sessions of HD.  TEE showed systolic heart failure but no endocarditis.  She is feeling well and denies CP or SOB.  She is looking forward to getting the vascath out.   Medications Prior to Admission  Medication Sig Dispense Refill  . ALPRAZolam (XANAX) 0.5 MG tablet Take 0.5 mg by mouth 2 (two) times daily.    Marland Kitchen amLODipine (NORVASC) 10 MG tablet Take 10 mg by mouth daily.    Marland Kitchen amoxicillin-clavulanate (AUGMENTIN) 500-125 MG per tablet Take 1 tablet by mouth 2 (two) times daily.    Marland Kitchen aspirin EC 81 MG tablet Take 81 mg by mouth daily.    Marland Kitchen atorvastatin (LIPITOR) 10 MG tablet Take 10 mg by mouth every morning.    . carvedilol (COREG) 25 MG tablet Take 1 tablet (25 mg total) by mouth 3 (three) times daily. 270 tablet 0  . furosemide (LASIX) 40 MG tablet Take 1 tablet by mouth  daily (Patient taking differently: Take 2 tablet by mouth  daily) 30 tablet 1  . glimepiride (AMARYL) 2 MG tablet Take 2 mg by mouth daily.    . iron polysaccharides (NIFEREX) 150 MG capsule Take 1 capsule (150 mg total) by mouth daily. 30 capsule 0  . isosorbide dinitrate (ISORDIL) 20 MG tablet Take 1 tablet (20 mg total) by mouth 3 (three) times daily. 270 tablet 0  . latanoprost (XALATAN) 0.005 % ophthalmic solution Place 1 drop into both eyes at bedtime.    . metFORMIN (GLUCOPHAGE) 500 MG tablet Take 500-1,000 mg by mouth 3 (three) times daily. Takes 1000 mg in the morning, 500 mg at lunch, and 1000 mg at  night    . nitroGLYCERIN (NITROSTAT) 0.4 MG SL tablet Place 0.4 mg under the tongue every 5 (five) minutes as needed for chest pain.    . predniSONE (STERAPRED UNI-PAK 21 TAB) 10 MG (21) TBPK tablet Take 10-20 mg by mouth as directed. 6 day dose pack    . PROAIR HFA 108 (90 BASE) MCG/ACT inhaler Inhale 2 puffs into the lungs every 4 (four) hours as needed for wheezing or shortness of breath.     . salmeterol (SEREVENT) 50 MCG/DOSE diskus inhaler Inhale 1 puff into the lungs 2 (two) times daily.       Marland Kitchen antiseptic oral rinse  7 mL Mouth Rinse BID  . aspirin  81 mg Oral Daily  . carvedilol  6.25 mg Oral BID WC  . darbepoetin (ARANESP) injection - NON-DIALYSIS  100 mcg Subcutaneous Q Fri-1800  . heparin subcutaneous  5,000 Units Subcutaneous 3 times per day  . insulin aspart  0-5 Units Subcutaneous QHS  . insulin aspart  0-9 Units Subcutaneous TID WC  . isosorbide-hydrALAZINE  1 tablet Oral TID  . latanoprost  1 drop Both Eyes QHS  . [START ON 01/22/2015] predniSONE  10 mg Oral Once  . simvastatin  10 mg Oral q1800    Infusions:    No Known Allergies PHYSICAL EXAM: Filed Vitals:   01/21/15 1540  BP: 136/62  Pulse: 78  Temp: 98.6 F (37 C)  Resp: 17     Intake/Output Summary (Last 24 hours) at 01/21/15 1634 Last data filed at 01/21/15 1540  Gross per 24 hour  Intake    660 ml  Output    579 ml  Net     81 ml   General:  Well appearing. No respiratory difficulty laying flat in bed. HEENT: normal Neck: supple. no JVD. Carotids 2+ bilat; no bruits. No lymphadenopathy or thryomegaly appreciated. Cor: PMI nondisplaced. Regular rate & rhythm. No rubs, gallops or murmurs. Lungs: clear anteriorly Abdomen: soft, nontender, nondistended. No hepatosplenomegaly. No bruits or masses. Good bowel sounds. Extremities: no cyanosis, clubbing, rash, edema.  R femoral vascath. Neuro: alert & oriented x 3, cranial nerves grossly intact. moves all 4 extremities w/o difficulty. Affect  pleasant.  Results for orders placed or performed during the hospital encounter of 01/14/15 (from the past 24 hour(s))  Glucose, capillary     Status: Abnormal   Collection Time: 01/20/15  7:48 PM  Result Value Ref Range   Glucose-Capillary 171 (H) 65 - 99 mg/dL  Glucose, capillary     Status: Abnormal   Collection Time: 01/20/15 11:41 PM  Result Value Ref Range   Glucose-Capillary 216 (H) 65 - 99 mg/dL  Glucose, capillary     Status: Abnormal   Collection Time: 01/21/15  1:48 AM  Result Value Ref Range   Glucose-Capillary 208 (H) 65 - 99 mg/dL  Renal function panel (daily at 0500)     Status: Abnormal   Collection Time: 01/21/15  3:47 AM  Result Value Ref Range   Sodium 128 (L) 135 - 145 mmol/L   Potassium 3.6 3.5 - 5.1 mmol/L   Chloride 92 (L) 101 - 111 mmol/L   CO2 28 22 - 32 mmol/L   Glucose, Bld 168 (H) 65 - 99 mg/dL   BUN 56 (H) 6 - 20 mg/dL   Creatinine, Ser 9.60 (H) 0.44 - 1.00 mg/dL   Calcium 7.9 (L) 8.9 - 10.3 mg/dL   Phosphorus 3.7 2.5 - 4.6 mg/dL   Albumin 2.7 (L) 3.5 - 5.0 g/dL   GFR calc non Af Amer 10 (L) >60 mL/min   GFR calc Af Amer 11 (L) >60 mL/min   Anion gap 8 5 - 15  Magnesium     Status: None   Collection Time: 01/21/15  3:47 AM  Result Value Ref Range   Magnesium 2.2 1.7 - 2.4 mg/dL  Glucose, capillary     Status: Abnormal   Collection Time: 01/21/15  5:42 AM  Result Value Ref Range   Glucose-Capillary 116 (H) 65 - 99 mg/dL  Glucose, capillary     Status: Abnormal   Collection Time: 01/21/15  7:58 AM  Result Value Ref Range   Glucose-Capillary 142 (H) 65 - 99 mg/dL  Osmolality     Status: None   Collection Time: 01/21/15  8:30 AM  Result Value Ref Range   Osmolality 297 275 - 300 mOsm/kg  Uric acid     Status: None   Collection Time: 01/21/15  8:30 AM  Result Value Ref Range   Uric Acid, Serum 5.7 2.3 - 6.6 mg/dL  Osmolality, urine     Status: Abnormal   Collection Time: 01/21/15  9:00 AM  Result Value Ref Range   Osmolality, Ur 241 (L)  390 - 1090 mOsm/kg  Sodium, urine, random     Status: None   Collection Time: 01/21/15  9:00 AM  Result Value Ref Range   Sodium, Ur 83 mmol/L  Creatinine, urine, random     Status: None   Collection Time: 01/21/15  9:00 AM  Result Value Ref Range   Creatinine, Urine 29.32 mg/dL  Urinalysis, Routine w reflex microscopic (not at [facility])     Status: Abnormal   Collection Time: 01/21/15  9:00 AM  Result Value Ref Range   Color, Urine YELLOW YELLOW   APPearance CLOUDY (A) CLEAR   Specific Gravity, Urine 1.007 1.005 - 1.030   pH 5.5 5.0 - 8.0   Glucose, UA NEGATIVE NEGATIVE mg/dL   Hgb urine dipstick MODERATE (A) NEGATIVE   Bilirubin Urine NEGATIVE NEGATIVE   Ketones, ur NEGATIVE NEGATIVE mg/dL   Protein, ur 696 (A) NEGATIVE mg/dL   Urobilinogen, UA 0.2 0.0 - 1.0 mg/dL   Nitrite NEGATIVE NEGATIVE   Leukocytes, UA TRACE (A) NEGATIVE  Urine microscopic-add on     Status: Abnormal   Collection Time: 01/21/15  9:00 AM  Result Value Ref Range   Squamous Epithelial / LPF RARE RARE   WBC, UA 3-6 <3 WBC/hpf   RBC / HPF 7-10 <3 RBC/hpf   Casts GRANULAR CAST (A) NEGATIVE  Glucose, capillary     Status: Abnormal   Collection Time: 01/21/15 11:37 AM  Result Value Ref Range   Glucose-Capillary 213 (H) 65 - 99 mg/dL  Glucose, capillary     Status: Abnormal   Collection Time: 01/21/15  4:23 PM  Result Value Ref Range   Glucose-Capillary 140 (H) 65 - 99 mg/dL   No results found.  LHC 04/2014: LEFT VENTRICULOGRAM: Left ventricular angiogram was done in the 30 RAO projection and revealed global hypokinesis with an EF of approximately 35-40%. LVEDP was elevated.   IMPRESSIONS: 1. Severe disease in a branch of the first diagonal. The vessel is relatively small in caliber and is best treated with medical therapy. This is the likely source of the patient's angina. 2. Widely patent LAD stent. Noncritical circumflex and right coronary disease as noted above. 3. Global left ventricular systolic  dysfunction with elevated end-diastolic pressure consistent with chronic systolic heart failure.   ASSESSMENT/PLAN:  1. Septic Shock:  Reason for admission.  Stable and continues on ceftriaxone. - From presumed UTI per CCM note. - On rocephin. Blood and Urine cultures negative to date. UA negative  2. Acute renal failure on CKD Stage IV (Baseline Cr High 2s).  Continues on HD.  Had 3rd session today and tolerated well.  Vascath to be removed today and unclear whether she will continue to need HD. - Nephrology following  3. Acute respiratory failure with COPD: Significantly improved.  Now on [place].  - Required short term intubation this admission.  - On steroids and rocephin  4. Chronic systolic HF, EF 25-30%, ICM: Reduced from 35-40% on echo 11/15 and cath 12/15.  Troponin was negative on admission.  Euvolemic after HD.   - Continue to titrate carvedilol.  Would increase this prior to increasing hydral/nitrates - Fluid managed with HD - Home meds included coreg 25 mg TID, lasix 40 mg daily, and imdur 20 mg TID. - No ACE or ARB chronically with CKD.  5. CAD - Last cath 12/15. Severe disease first diagonal, medical treatment due to small caliber. Widely patent LAD stent, non-critical circumflex and RC disease. LVEF 35-40%.  Medical management for now without ischemia evaluation given acute on chronic renal failure. - Had been on ASA 81 g and Lipitor 10 mg daily at home. -  Agree with restarting ASA  daily - Switch simvastatin to atorvastatin  6. DM - Per primary team  Signed: Madilyn Hook 01/21/2015, 4:34 PM

## 2015-01-22 DIAGNOSIS — E875 Hyperkalemia: Secondary | ICD-10-CM

## 2015-01-22 LAB — CBC
HCT: 25 % — ABNORMAL LOW (ref 36.0–46.0)
HEMOGLOBIN: 7.8 g/dL — AB (ref 12.0–15.0)
MCH: 29.5 pg (ref 26.0–34.0)
MCHC: 31.2 g/dL (ref 30.0–36.0)
MCV: 94.7 fL (ref 78.0–100.0)
Platelets: 152 10*3/uL (ref 150–400)
RBC: 2.64 MIL/uL — ABNORMAL LOW (ref 3.87–5.11)
RDW: 16.7 % — ABNORMAL HIGH (ref 11.5–15.5)
WBC: 9 10*3/uL (ref 4.0–10.5)

## 2015-01-22 LAB — HEPATITIS B SURFACE ANTIBODY, QUANTITATIVE: Hepatitis B-Post: <3.1 m[IU]/mL — ABNORMAL LOW (ref >9.9)

## 2015-01-22 LAB — URINE CULTURE: CULTURE: NO GROWTH

## 2015-01-22 LAB — GLUCOSE, CAPILLARY
GLUCOSE-CAPILLARY: 193 mg/dL — AB (ref 65–99)
Glucose-Capillary: 165 mg/dL — ABNORMAL HIGH (ref 65–99)
Glucose-Capillary: 323 mg/dL — ABNORMAL HIGH (ref 65–99)
Glucose-Capillary: 309 mg/dL — ABNORMAL HIGH (ref 65–99)

## 2015-01-22 LAB — RENAL FUNCTION PANEL
ALBUMIN: 2.9 g/dL — AB (ref 3.5–5.0)
ANION GAP: 11 (ref 5–15)
BUN: 24 mg/dL — AB (ref 6–20)
CALCIUM: 8 mg/dL — AB (ref 8.9–10.3)
CO2: 26 mmol/L (ref 22–32)
Chloride: 98 mmol/L — ABNORMAL LOW (ref 101–111)
Creatinine, Ser: 3.21 mg/dL — ABNORMAL HIGH (ref 0.44–1.00)
GFR calc Af Amer: 16 mL/min — ABNORMAL LOW (ref >60)
GFR calc non Af Amer: 14 mL/min — ABNORMAL LOW (ref >60)
GLUCOSE: 175 mg/dL — AB (ref 65–99)
PHOSPHORUS: 3.4 mg/dL (ref 2.5–4.6)
Potassium: 4.2 mmol/L (ref 3.5–5.1)
SODIUM: 135 mmol/L (ref 135–145)

## 2015-01-22 LAB — MAGNESIUM: MAGNESIUM: 2 mg/dL (ref 1.7–2.4)

## 2015-01-22 LAB — HEPATITIS B CORE ANTIBODY, IGM: Hep B C IgM: NEGATIVE

## 2015-01-22 MED ORDER — INSULIN GLARGINE 100 UNIT/ML ~~LOC~~ SOLN
10.0000 [IU] | Freq: Every day | SUBCUTANEOUS | Status: DC
  Administered 2015-01-22 – 2015-01-30 (×8): 10 [IU] via SUBCUTANEOUS
  Filled 2015-01-22 (×9): qty 0.1

## 2015-01-22 MED ORDER — GLUCERNA PO LIQD
237.0000 mL | Freq: Three times a day (TID) | ORAL | Status: DC | PRN
  Administered 2015-01-22: 237 mL via ORAL
  Filled 2015-01-22 (×2): qty 250

## 2015-01-22 NOTE — Progress Notes (Signed)
Subjective:  Did final HD thru vascath yest afternoon- vascath now out  Objective Vital signs in last 24 hours: Filed Vitals:   01/21/15 1540 01/21/15 1632 01/21/15 2006 01/22/15 0437  BP: 136/62 126/55 127/59 134/66  Pulse: 78 106 91 102  Temp: 98.6 F (37 C) 99.3 F (37.4 C) 99 F (37.2 C) 98.5 F (36.9 C)  TempSrc: Oral Oral Oral Oral  Resp: 17 20 18 18   Weight: 53.6 kg (118 lb 2.7 oz)   52.8 kg (116 lb 6.5 oz)  SpO2: 98% 100% 100% 100%   Weight change: 0.983 kg (2 lb 2.7 oz)  Intake/Output Summary (Last 24 hours) at 01/22/15 0913 Last data filed at 01/22/15 8527  Gross per 24 hour  Intake   1180 ml  Output    380 ml  Net    800 ml    Assessment/ Plan: Pt is a 64 y.o. yo female who was admitted on 01/14/2015 with sepsis of unknown etiology after renal biopsy for diagnostic purposes  Assessment/Plan: 1. Renal- CKD as OP- crt climbing to over 3- s/p renal bx 9/1 for diagnostic purposes-  results just c/w hypertensive nephrosclerosis.  Now with A on CRF in the setting of presumed sepsis but of unknown source requiring CRRT  9/3-9/7.   Clinically improved making some urine- creatinine went up again now mid 4's with baseline near 3.  Has femoral vascath so will do brief HD today for clearance only then get vascath out.  Not sure if this event will make dialysis requiring from now.  Will need to continue to assess fur further HD need the next few days so I dont think discharge tomorrow will be possible 2. sepsis- no positive cultures- rocephin 3. Anemia- low but stable- on ESA as OP- getting here as well 4. Secondary hyperparathyroidism- last PTH 102- no vit D- phos 3.7 5. HTN/volume- some volume overload-  pressure is better- on  norvasc and coreg- also determined to have poor EF 6. VDRF- had to be re intubated mostly for COPD which she has had for years- family thinks is doing so much better on O2- may need chronically  7. Acidosis- resolved  8. Hypokalemia- resolved    Kealie Barrie A    Labs: Basic Metabolic Panel:  Recent Labs Lab 01/20/15 0548 01/21/15 0347 01/22/15 0500  NA 136 128* 135  K 3.3* 3.6 4.2  CL 95* 92* 98*  CO2 29 28 26   GLUCOSE 173* 168* 175*  BUN 55* 56* 24*  CREATININE 3.76* 4.46* 3.21*  CALCIUM 8.3* 7.9* 8.0*  PHOS 3.6 3.7 3.4   Liver Function Tests:  Recent Labs Lab 01/18/15 0501  01/20/15 0548 01/21/15 0347 01/22/15 0500  AST 26  --   --   --   --   ALT 19  --   --   --   --   ALKPHOS 45  --   --   --   --   BILITOT 1.2  --   --   --   --   PROT 5.7*  --   --   --   --   ALBUMIN 3.3*  < > 2.8* 2.7* 2.9*  < > = values in this interval not displayed. No results for input(s): LIPASE, AMYLASE in the last 168 hours. No results for input(s): AMMONIA in the last 168 hours. CBC:  Recent Labs Lab 01/16/15 0420 01/17/15 0418 01/18/15 0501 01/19/15 0404 01/22/15 0500  WBC 9.9 10.7* 9.1 12.1* 9.0  NEUTROABS  --   --  8.4* 10.8*  --   HGB 8.0* 8.6* 8.6* 8.5* 7.8*  HCT 24.2* 26.7* 26.6* 26.1* 25.0*  MCV 89.3 92.1 91.1 89.1 94.7  PLT 88* 103* 103* 130* 152   Cardiac Enzymes: No results for input(s): CKTOTAL, CKMB, CKMBINDEX, TROPONINI in the last 168 hours. CBG:  Recent Labs Lab 01/21/15 0758 01/21/15 1137 01/21/15 1623 01/21/15 2115 01/22/15 0623  GLUCAP 142* 213* 140* 248* 165*    Iron Studies: No results for input(s): IRON, TIBC, TRANSFERRIN, FERRITIN in the last 72 hours. Studies/Results: Dg Chest 2 View  01/21/2015   CLINICAL DATA:  Cough.  EXAM: CHEST  2 VIEW  COMPARISON:  January 19, 2015.  FINDINGS: The heart size and mediastinal contours are within normal limits. Both lungs are clear. No pneumothorax is noted. Minimal bilateral pleural effusions are noted. The visualized skeletal structures are unremarkable.  IMPRESSION: Minimal bilateral pleural effusions. No other significant abnormality seen in the chest.   Electronically Signed   By: Lupita Raider, M.D.   On: 01/21/2015 20:27    Medications: Infusions:    Scheduled Medications: . antiseptic oral rinse  7 mL Mouth Rinse BID  . aspirin  81 mg Oral Daily  . atorvastatin  10 mg Oral q1800  . carvedilol  6.25 mg Oral BID WC  . darbepoetin (ARANESP) injection - NON-DIALYSIS  100 mcg Subcutaneous Q Fri-1800  . heparin subcutaneous  5,000 Units Subcutaneous 3 times per day  . insulin aspart  0-5 Units Subcutaneous QHS  . insulin aspart  0-9 Units Subcutaneous TID WC  . isosorbide-hydrALAZINE  1 tablet Oral TID  . latanoprost  1 drop Both Eyes QHS    have reviewed scheduled and prn medications.  Physical Exam: General: looks great- sitting up- multiple family members present Heart: RRR Lungs: mostly clear Abdomen: soft, non tender Extremities: no edema Dialysis Access: femoral vascath placed 9/3 - now out    01/22/2015,9:13 AM  LOS: 8 days

## 2015-01-22 NOTE — Progress Notes (Signed)
Patient Demographics:    Pamela [name], is a 64 y.o. female, DOB - 01-08-1951, ZOX:[id number]  Admit date - 01/14/2015   Admitting Physician Alyson Reedy, MD  Outpatient Primary MD for the patient is Kristie Cowman, MD  LOS - 8   Chief Complaint  Patient presents with  . Fatigue       Brief summary  This is a pleasant 64 year old Caucasian female with history of hypertensive nephrosclerosis with CK D stage III under the care of Dr. Kathrene Bongo, understandable CAD with chronic combined diastolic and systolic heart failure EF around 30%, mild AS, essential hypertension, dyslipidemia who underwent an outpatient kidney biopsy on 01/12/2015 thereafter presented to the ER on 01/14/2015 for decreased mentation due to uremia, she was found to be in acute renal failure with hyperkalemia, developed respiratory failure due to combination of fluid overload along with failure to protect airway due to uremia. She was intubated and kept in ICU. She was seen by cardiology and nephrology. Right femoral Vas-Cath was placed and she was started on dialysis this admission. She underwent TTE along with TEE both confirming severe combined systolic and diastolic CHF however no evidence of endocarditis.  She was stabilized and transferred to hospitalist service under my care on day 7 of her hospital stay date 01/21/2015.    Subjective:    [name] today has, No headache, No chest pain, No abdominal pain - No Nausea, No new weakness tingling or numbness, No Cough - SOB.     Assessment  & Plan :     1. Acute hypoxic respiratory failure due to combination of toxic encephalopathy failure to protect airway, fluid overload. Requiring intubation and extubation 2, finally extubated on 01/20/2015. Pulmonary status appears  stable. No evidence of pneumonia. Mentation has improved. Try to titrate off oxygen, supportive care with Neb treatments as needed.  2. ARF on CK D stage III. Underlying problem thought to be hypertensive nephrosclerosis, renal on board, was dialyzed via right femoral Vas-Cath. Defer management of this problem to nephrology.  3. Acute on chronic combined diastolic and systolic heart failure with ischemic cardiomyopathy. EF 30%. Underwent TTE and TEE, cardiology on board, fluid removal via dialysis, continue Coreg along with BiDil. Has unstentable CAD. Have added low-dose aspirin as well.  4. Questionable sepsis. No clear source, all cultures negative, UA was clear, chest x-ray was clear, one of her sputum cultures grew Enterobacter cloacae, in ICU was tapered down to Rocephin which she finished on 01/21/2015. Will monitor temperature curve and CBC, stable repeat 2 view chest x-ray on 01/21/2015.  5. History of COPD. No wheezing, nebulizer treatments as needed, try to titrate off oxygen.   6. Toxic encephalopathy due to uremia. Resolved plan as in #1 above.   7. Dyslipidemia. Statin has been switched by cardiology.   8. Left cephalic vein SVT noted on ultrasound done in ICU on 01/18/2015. Supportive care. Discussed with vascular surgeon on call Dr. Cherly Hensen. No further evaluation or treatment for SVT. No line in left arm.    9. DM type II. Check A1c place on sliding scale for now. Avoid oral hypoglycemics for now. Will add low-dose Lantus.  No results found for: HGBA1C  CBG (last 3)   Recent Labs  01/21/15  1623 01/21/15 2115 01/22/15 0623  GLUCAP 140* 248* 165*      Code Status : Partial  Family Communication  : Husband bedside  Disposition Plan  : Be decided  Consults  :  PCCM, renal, cardiology  Procedures  :   9/3 intubated and renal consult called. 9/5- neg 2.2 liters, cvvhd 9/6- increase urine output, ETT placed 9/7 doppler>>>neg 9/7 echo>>>25-30%, pa 40, thick  aortic 9/8- extubated 9/9 tee>>>severely depressed, AV with very mild calcification. No obvious vegetation  9/9 - urine output increased 9/7 - Doppler obtained in ICU positive for left cephalic vein SVT.   DVT Prophylaxis  :  Heparin   Lab Results  Component Value Date   PLT 152 01/22/2015    Inpatient Medications  Scheduled Meds: . antiseptic oral rinse  7 mL Mouth Rinse BID  . aspirin  81 mg Oral Daily  . atorvastatin  10 mg Oral q1800  . carvedilol  6.25 mg Oral BID WC  . darbepoetin (ARANESP) injection - NON-DIALYSIS  100 mcg Subcutaneous Q Fri-1800  . heparin subcutaneous  5,000 Units Subcutaneous 3 times per day  . insulin aspart  0-5 Units Subcutaneous QHS  . insulin aspart  0-9 Units Subcutaneous TID WC  . isosorbide-hydrALAZINE  1 tablet Oral TID  . latanoprost  1 drop Both Eyes QHS   Continuous Infusions:  PRN Meds:.acetaminophen (TYLENOL) oral liquid 160 mg/5 mL, acetaminophen, ipratropium-albuterol  Antibiotics  :     Anti-infectives    Start     Dose/Rate Route Frequency Ordered Stop   01/17/15 1000  cefTRIAXone (ROCEPHIN) 1 g in dextrose 5 % 50 mL IVPB     1 g 100 mL/hr over 30 Minutes Intravenous Every 24 hours 01/17/15 0901 01/21/15 1206   01/15/15 1600  vancomycin (VANCOCIN) IVPB 750 mg/150 ml premix  Status:  Discontinued     750 mg 150 mL/hr over 60 Minutes Intravenous Every 24 hours 01/15/15 0851 01/16/15 0909   01/15/15 0100  piperacillin-tazobactam (ZOSYN) IVPB 2.25 g  Status:  Discontinued     2.25 g 100 mL/hr over 30 Minutes Intravenous Every 8 hours 01/14/15 1614 01/14/15 2152   01/14/15 2200  piperacillin-tazobactam (ZOSYN) IVPB 2.25 g  Status:  Discontinued     2.25 g 100 mL/hr over 30 Minutes Intravenous 4 times per day 01/14/15 2152 01/17/15 0901   01/14/15 1600  vancomycin (VANCOCIN) IVPB 1000 mg/200 mL premix     1,000 mg 200 mL/hr over 60 Minutes Intravenous  Once 01/14/15 1506 01/14/15 1708   01/14/15 1600  piperacillin-tazobactam  (ZOSYN) IVPB 2.25 g  Status:  Discontinued     2.25 g 100 mL/hr over 30 Minutes Intravenous  Once 01/14/15 1506 01/14/15 1512   01/14/15 1512  piperacillin-tazobactam (ZOSYN) IVPB 3.375 g     3.375 g 100 mL/hr over 30 Minutes Intravenous  Once 01/14/15 1512 01/14/15 1624        Objective:   Filed Vitals:   01/21/15 1540 01/21/15 1632 01/21/15 2006 01/22/15 0437  BP: 136/62 126/55 127/59 134/66  Pulse: 78 106 91 102  Temp: 98.6 F (37 C) 99.3 F (37.4 C) 99 F (37.2 C) 98.5 F (36.9 C)  TempSrc: Oral Oral Oral Oral  Resp: 17 20 18 18   Weight: 53.6 kg (118 lb 2.7 oz)   52.8 kg (116 lb 6.5 oz)  SpO2: 98% 100% 100% 100%    Wt Readings from Last 3 Encounters:  01/22/15 52.8 kg (116 lb 6.5 oz)  01/12/15 50.803  kg (112 lb)  10/24/14 51.982 kg (114 lb 9.6 oz)     Intake/Output Summary (Last 24 hours) at 01/22/15 1002 Last data filed at 01/22/15 0839  Gross per 24 hour  Intake   1180 ml  Output    380 ml  Net    800 ml     Physical Exam  Awake Alert, Oriented X 3, No new F.N deficits, Normal affect [place].AT,PERRAL Supple Neck,No JVD, No cervical lymphadenopathy appriciated.  Symmetrical Chest wall movement, Good air movement bilaterally, CTAB RRR,No Gallops,Rubs or new Murmurs, No Parasternal Heave +ve B.Sounds, Abd Soft, No tenderness, No organomegaly appriciated, No rebound - guarding or rigidity. No Cyanosis, Clubbing or edema, No new Rash or bruise    Foley catheter in place.   Data Review:   Micro Results Recent Results (from the past 240 hour(s))  Culture, blood (routine x 2)     Status: None   Collection Time: 01/14/15  2:45 PM  Result Value Ref Range Status   Specimen Description BLOOD LEFT WRIST  Final   Special Requests BOTTLES DRAWN AEROBIC AND ANAEROBIC 5CC  Final   Culture NO GROWTH 5 DAYS  Final   Report Status 01/19/2015 FINAL  Final  Culture, blood (routine x 2)     Status: None   Collection Time: 01/14/15  2:55 PM  Result Value Ref Range Status    Specimen Description BLOOD RIGHT HAND  Final   Special Requests BOTTLES DRAWN AEROBIC AND ANAEROBIC  Final   Culture NO GROWTH 5 DAYS  Final   Report Status 01/19/2015 FINAL  Final  MRSA PCR Screening     Status: None   Collection Time: 01/14/15  6:00 PM  Result Value Ref Range Status   MRSA by PCR NEGATIVE NEGATIVE Final    Comment:        The GeneXpert MRSA Assay (FDA approved for NASAL specimens only), is one component of a comprehensive MRSA colonization surveillance program. It is not intended to diagnose MRSA infection nor to guide or monitor treatment for MRSA infections.   Culture, respiratory (NON-Expectorated)     Status: None   Collection Time: 01/14/15  6:03 PM  Result Value Ref Range Status   Specimen Description TRACHEAL ASPIRATE  Final   Special Requests NONE  Final   Gram Stain   Final    NO WBC SEEN NO SQUAMOUS EPITHELIAL CELLS SEEN NO ORGANISMS SEEN Performed at [facility]    Culture   Final    FEW ENTEROBACTER CLOACAE Performed at [facility]    Report Status 01/16/2015 FINAL  Final   Organism ID, Bacteria ENTEROBACTER CLOACAE  Final      Susceptibility   Enterobacter cloacae - MIC*    CEFAZOLIN >=64 RESISTANT Resistant     CEFEPIME <=1 SENSITIVE Sensitive     CEFTAZIDIME <=1 SENSITIVE Sensitive     CEFTRIAXONE <=1 SENSITIVE Sensitive     CIPROFLOXACIN <=0.25 SENSITIVE Sensitive     GENTAMICIN <=1 SENSITIVE Sensitive     IMIPENEM 0.5 SENSITIVE Sensitive     PIP/TAZO <=4 SENSITIVE Sensitive     TOBRAMYCIN <=1 SENSITIVE Sensitive     TRIMETH/SULFA <=20 SENSITIVE Sensitive     * FEW ENTEROBACTER CLOACAE  Urine culture     Status: None   Collection Time: 01/15/15 10:05 PM  Result Value Ref Range Status   Specimen Description URINE, CATHETERIZED  Final   Special Requests NONE  Final   Culture NO GROWTH 2 DAYS  Final   Report Status 01/17/2015 FINAL  Final  Culture, blood (routine x 2)     Status: None (Preliminary result)    Collection Time: 01/18/15  4:40 PM  Result Value Ref Range Status   Specimen Description BLOOD RIGHT ARM  Final   Special Requests BOTTLES DRAWN AEROBIC ONLY 5CC  Final   Culture NO GROWTH 3 DAYS  Final   Report Status PENDING  Incomplete  Culture, blood (routine x 2)     Status: None (Preliminary result)   Collection Time: 01/18/15  4:45 PM  Result Value Ref Range Status   Specimen Description BLOOD LEFT ARM  Final   Special Requests BOTTLES DRAWN AEROBIC ONLY 5CC  Final   Culture NO GROWTH 3 DAYS  Final   Report Status PENDING  Incomplete  Urine culture     Status: None   Collection Time: 01/21/15  9:00 AM  Result Value Ref Range Status   Specimen Description URINE, CLEAN CATCH  Final   Special Requests NONE  Final   Culture NO GROWTH 1 DAY  Final   Report Status 01/22/2015 FINAL  Final    Radiology Reports Dg Chest 2 View  01/21/2015   CLINICAL DATA:  Cough.  EXAM: CHEST  2 VIEW  COMPARISON:  January 19, 2015.  FINDINGS: The heart size and mediastinal contours are within normal limits. Both lungs are clear. No pneumothorax is noted. Minimal bilateral pleural effusions are noted. The visualized skeletal structures are unremarkable.  IMPRESSION: Minimal bilateral pleural effusions. No other significant abnormality seen in the chest.   Electronically Signed   By: Lupita Raider, M.D.   On: 01/21/2015 20:27   US Renal  01/14/2015   CLINICAL DATA:  Renal failure. History of hypertension, COPD, diabetes.  EXAM: RENAL / URINARY TRACT ULTRASOUND COMPLETE  COMPARISON:  10/21/2014  FINDINGS: Right Kidney:  Length: 9.9 cm. Renal parenchyma is echogenic. Multiple small cysts are identified. The largest is seen in the lower pole region measuring 1.1 cm. No hydronephrosis.  Left Kidney:  Length: 10.2 cm. Echogenic renal parenchyma. Multiple small cysts are identified, largest in the lower pole region which measures 1.3 cm.  Bladder:  A Foley catheter decompresses the bladder.  Additional:  Note  is made of a small amount of ascites.  IMPRESSION: 1. Echogenic kidneys bilaterally.  Bilateral renal cysts. 2. No hydronephrosis. 3. Small amount of ascites noted.   Electronically Signed   By: Norva Pavlov M.D.   On: 01/14/2015 17:44   US Biopsy  01/12/2015   INDICATION: Proteinuria of uncertain etiology. Please perform ultrasound-guided biopsy for tissue diagnostic purposes.  EXAM: ULTRASOUND GUIDED RENAL BIOPSY  COMPARISON:  Renal ultrasound - 10/21/2014  MEDICATIONS: Fentanyl 50 mcg IV; Versed 1 mg IV  ANESTHESIA/SEDATION: Total Moderate Sedation time  15 minutes  COMPLICATIONS: SIR level A: No therapy, no consequence.  Procedure complicated by development of a tiny, asymptomatic, non enlarging left-sided perinephric hematoma.  PROCEDURE: Informed written consent was obtained from the patient after a discussion of the risks, benefits and alternatives to treatment. The patient understands and consents the procedure. A timeout was performed prior to the initiation of the procedure.  Ultrasound scanning was performed of the bilateral flanks. The inferior pole of the left kidney was selected for biopsy due to location and sonographic window. The procedure was planned. The operative site was prepped and draped in the usual sterile fashion. The overlying soft tissues were anesthetized with 1% lidocaine with epinephrine. A 17 gauge  core needle biopsy device was advanced into the inferior cortex of the left kidney and 3 core biopsies were obtained under direct ultrasound guidance. Real time pathologic review confirmed adequate tissue acquisition. Images were saved for documentation purposes. The biopsy device was removed and hemostasis was obtained with manual compression.  Postprocedural scanning and demonstrated development of a very tiny perinephric hematoma which was noted to not enlarged on the acquired delayed postprocedural imaging (representative images 8 and 9). Post procedural scanning was negative for  significant post procedural hemorrhage or additional complication. A dressing was placed. The patient tolerated the procedure well without immediate post procedural complication.  IMPRESSION: Technically successful ultrasound guided left renal biopsy.   Electronically Signed   By: Simonne Come M.D.   On: 01/12/2015 13:57   Dg Chest Port 1 View  01/19/2015   CLINICAL DATA:  Renal failure.  EXAM: PORTABLE CHEST - 1 VIEW  COMPARISON:  01/17/2015.  FINDINGS: Endotracheal tube, left IJ line, NG tube in stable position. Heart size is stable. Bibasilar pulmonary infiltrates consistent pneumonia. Minimal interval improvement. No pleural effusion or pneumothorax.  IMPRESSION: 1. Lines and tubes in stable position. 2. Bibasilar infiltrates consistent with pneumonia. Minimal interval improvement.   Electronically Signed   By: Maisie Fus  Register   On: 01/19/2015 07:21   Dg Chest Port 1 View  01/17/2015   CLINICAL DATA:  Ventilator dependent respiratory failure. Followup left basilar atelectasis.  EXAM: PORTABLE CHEST - 1 VIEW  COMPARISON:  01/15/2015 and earlier.  FINDINGS: Endotracheal tube tip in satisfactory position approximately 4 cm above the carina. Nasogastric tube courses below the diaphragm into the stomach though its tip is not included on the image. Left jugular central venous catheter tip projects over the mid to lower SVC.  Cardiac silhouette normal in size, unchanged. Airspace opacities involving the lung bases bilaterally, right greater than left, progressive since the examination 2 days ago. Pulmonary vascularity normal. Possible small bilateral pleural effusions.  IMPRESSION: 1. Support apparatus satisfactory. 2. Pneumonia involving the lung bases, right greater than left, progressive since the examination 2 days ago. 3. Possible small bilateral pleural effusions.   Electronically Signed   By: Hulan Saas M.D.   On: 01/17/2015 11:28   Dg Chest Port 1 View  01/15/2015   CLINICAL DATA:  Septic shock,  respiratory failure and acute renal failure. Question left lower lobe pneumonia.  EXAM: PORTABLE CHEST - 1 VIEW  COMPARISON:  01/14/2015  FINDINGS: Endotracheal tube with tip 3.2 cm above the carina, left IJ central venous catheter with tip overlying the superior cavoatrial junction NG tube entering the stomach with tip off the field of view noted.  Left lower lung opacity has improved.  There is no evidence of pleural effusion or pneumothorax.  IMPRESSION: Improved left lower lung opacity, suggesting atelectasis.  No other significant change.   Electronically Signed   By: Harmon Pier M.D.   On: 01/15/2015 07:32   Dg Chest Port 1 View  01/14/2015   CLINICAL DATA:  Septic shock, respiratory failure and acute renal failure. Endotracheal tube and central line placement.  EXAM: PORTABLE CHEST - 1 VIEW  COMPARISON:  01/14/2015 and prior radiographs  FINDINGS: The cardiomediastinal silhouette is unchanged.  An endotracheal tube with tip 5.1 cm above the carina, left IJ central venous catheter with tip overlying the lower SVC and NG tube entering the stomach with tip off the field of view are noted.  Left lower lung airspace disease is present.  There is no  evidence of pneumothorax or pleural effusion.  IMPRESSION: Left lower lung airspace disease/ pneumonia.  Support apparatus as described.  No evidence of pneumothorax.   Electronically Signed   By: Harmon Pier M.D.   On: 01/14/2015 16:38   Dg Chest Portable 1 View  01/14/2015   CLINICAL DATA:  Sepsis.  EXAM: PORTABLE CHEST - 1 VIEW  COMPARISON:  03/25/2014.  FINDINGS: Interval borderline enlargement of the cardiac silhouette and mild increase in prominence of the interstitial markings, accentuated by a decreased inspiration. Calcified granuloma in the right lower lung zone. No pleural fluid. Diffuse osteopenia.  IMPRESSION: Interval borderline cardiomegaly and mildly increased prominence of the interstitial markings, most likely due to a poor inspiration. No gross  acute abnormality.   Electronically Signed   By: Beckie Salts M.D.   On: 01/14/2015 15:21   Dg Abd Portable 1v  01/17/2015   CLINICAL DATA:  Nasogastric tube placement  EXAM: PORTABLE ABDOMEN - 1 VIEW  COMPARISON:  None.  FINDINGS: Orogastric tube with the tip projecting over the antrum of the stomach. There is no bowel dilatation to suggest obstruction. There is no evidence of pneumoperitoneum, portal venous gas or pneumatosis. There are no pathologic calcifications along the expected course of the ureters.The osseous structures are unremarkable.  IMPRESSION: Orogastric tube with the tip projecting over the antrum of the stomach.   Electronically Signed   By: Elige Ko   On: 01/17/2015 11:19     CBC  Recent Labs Lab 01/16/15 0420 01/17/15 0418 01/18/15 0501 01/19/15 0404 01/22/15 0500  WBC 9.9 10.7* 9.1 12.1* 9.0  HGB 8.0* 8.6* 8.6* 8.5* 7.8*  HCT 24.2* 26.7* 26.6* 26.1* 25.0*  PLT 88* 103* 103* 130* 152  MCV 89.3 92.1 91.1 89.1 94.7  MCH 29.5 29.7 29.5 29.0 29.5  MCHC 33.1 32.2 32.3 32.6 31.2  RDW 16.2* 16.5* 16.0* 15.9* 16.7*  LYMPHSABS  --   --  0.4* 0.6*  --   MONOABS  --   --  0.3 0.7  --   EOSABS  --   --  0.0 0.0  --   BASOSABS  --   --  0.0 0.0  --     Chemistries   Recent Labs Lab 01/18/15 0501  01/19/15 0403 01/19/15 0917 01/19/15 2207 01/20/15 0548 01/21/15 0347 01/22/15 0500  NA 134*  --  133*  --   --  136 128* 135  K 3.6  --  3.0*  --   --  3.3* 3.6 4.2  CL 93*  --  93*  --   --  95* 92* 98*  CO2 27  --  28  --   --  29 28 26   GLUCOSE 206*  --  266*  --   --  173* 168* 175*  BUN 15  --  39*  --   --  55* 56* 24*  CREATININE 1.54*  --  2.48*  --   --  3.76* 4.46* 3.21*  CALCIUM 7.8*  --  8.2*  --   --  8.3* 7.9* 8.0*  MG 2.3  < > 2.3 2.3 2.2 2.3 2.2 2.0  AST 26  --   --   --   --   --   --   --   ALT 19  --   --   --   --   --   --   --   ALKPHOS 45  --   --   --   --   --   --   --  BILITOT 1.2  --   --   --   --   --   --   --   < > = values in  this interval not displayed. ------------------------------------------------------------------------------------------------------------------ estimated creatinine clearance is 13.4 mL/min (by C-G formula based on Cr of 3.21). ------------------------------------------------------------------------------------------------------------------ No results for input(s): HGBA1C in the last 72 hours. ------------------------------------------------------------------------------------------------------------------  Recent Labs  01/20/15 0908  TRIG 102   ------------------------------------------------------------------------------------------------------------------ No results for input(s): TSH, T4TOTAL, T3FREE, THYROIDAB in the last 72 hours.  Invalid input(s): FREET3 ------------------------------------------------------------------------------------------------------------------ No results for input(s): VITAMINB12, FOLATE, FERRITIN, TIBC, IRON, RETICCTPCT in the last 72 hours.  Coagulation profile  Recent Labs Lab 01/17/15 0418  INR 1.23    No results for input(s): DDIMER in the last 72 hours.  Cardiac Enzymes No results for input(s): CKMB, TROPONINI, MYOGLOBIN in the last 168 hours.  Invalid input(s): CK ------------------------------------------------------------------------------------------------------------------ Invalid input(s): POCBNP   Time Spent in minutes   35   Susa Raring K M.D on 01/22/2015 at 10:02 AM  Between 7am to 7pm - Pager - [phone]  After 7pm go to [email or web address] - password [facility]  [facility] -  Office  [phone]

## 2015-01-23 ENCOUNTER — Inpatient Hospital Stay (HOSPITAL_COMMUNITY): Payer: Medicare Other

## 2015-01-23 DIAGNOSIS — I5043 Acute on chronic combined systolic (congestive) and diastolic (congestive) heart failure: Secondary | ICD-10-CM

## 2015-01-23 LAB — CULTURE, BLOOD (ROUTINE X 2)
Culture: NO GROWTH
Culture: NO GROWTH

## 2015-01-23 LAB — GLUCOSE, CAPILLARY
GLUCOSE-CAPILLARY: 197 mg/dL — AB (ref 65–99)
GLUCOSE-CAPILLARY: 114 mg/dL — AB (ref 65–99)
GLUCOSE-CAPILLARY: 200 mg/dL — AB (ref 65–99)
Glucose-Capillary: 284 mg/dL — ABNORMAL HIGH (ref 65–99)
Glucose-Capillary: 239 mg/dL — ABNORMAL HIGH (ref 65–99)

## 2015-01-23 LAB — RENAL FUNCTION PANEL
ALBUMIN: 2.5 g/dL — AB (ref 3.5–5.0)
ANION GAP: 7 (ref 5–15)
BUN: 31 mg/dL — ABNORMAL HIGH (ref 6–20)
CALCIUM: 7.9 mg/dL — AB (ref 8.9–10.3)
CO2: 28 mmol/L (ref 22–32)
Chloride: 95 mmol/L — ABNORMAL LOW (ref 101–111)
Creatinine, Ser: 4.11 mg/dL — ABNORMAL HIGH (ref 0.44–1.00)
GFR, EST AFRICAN AMERICAN: 12 mL/min — AB (ref >60)
GFR, EST NON AFRICAN AMERICAN: 11 mL/min — AB (ref >60)
Glucose, Bld: 125 mg/dL — ABNORMAL HIGH (ref 65–99)
PHOSPHORUS: 4.3 mg/dL (ref 2.5–4.6)
Potassium: 4.1 mmol/L (ref 3.5–5.1)
SODIUM: 130 mmol/L — AB (ref 135–145)

## 2015-01-23 LAB — HEMOGLOBIN A1C
Hgb A1c MFr Bld: 6.8 % — ABNORMAL HIGH (ref 4.8–5.6)
Mean Plasma Glucose: 148 mg/dL

## 2015-01-23 LAB — PROCALCITONIN: Procalcitonin: 0.33 ng/mL

## 2015-01-23 LAB — MAGNESIUM: Magnesium: 2.2 mg/dL (ref 1.7–2.4)

## 2015-01-23 MED ORDER — FUROSEMIDE 10 MG/ML IJ SOLN
80.0000 mg | Freq: Once | INTRAMUSCULAR | Status: AC
  Administered 2015-01-23: 80 mg via INTRAVENOUS
  Filled 2015-01-23: qty 8

## 2015-01-23 MED ORDER — CARVEDILOL 6.25 MG PO TABS
9.3750 mg | ORAL_TABLET | Freq: Two times a day (BID) | ORAL | Status: DC
  Administered 2015-01-23 – 2015-01-24 (×3): 9.375 mg via ORAL
  Filled 2015-01-23 (×8): qty 1

## 2015-01-23 NOTE — Progress Notes (Signed)
Advanced Heart Failure Rounding Note  PCP: Dr Kristie Cowman Primary Cardiologist: Dr Verdis Prime  Subjective:    Had last HD Saturday prior to having vascath removed.  Nephro to follow to assess need for ongoing HD.    Feeling better.  Breathing OK. Sats dropped to 85% over weekend attempting to wean off oxygen so remains on [place].  Strength slowly returning.  Still has a rough cough but nothing coming up.  Says she hasn't gotten a breathing treatment since she left ICU and asks if that would help.   I/O only show out 270 mL yesterday.  Weight up 4-6 lbs from previous days.  Will need IV diuretic today.  BP stable.   Objective:   Weight Range: 122 lb 9.6 oz (55.611 kg) Body mass index is 23.18 kg/(m^2).   Vital Signs:   Temp:  [98.2 F (36.8 C)-99 F (37.2 C)] 98.7 F (37.1 C) (09/12 0528) Pulse Rate:  [86-96] 96 (09/12 0528) Resp:  [18-20] 18 (09/12 0528) BP: (127-137)/(48-63) 134/48 mmHg (09/12 0528) SpO2:  [91 %-100 %] 100 % (09/12 0528) Weight:  [122 lb 9.6 oz (55.611 kg)] 122 lb 9.6 oz (55.611 kg) (09/12 0528) Last BM Date: 01/21/15  Weight change: Filed Weights   01/21/15 1540 01/22/15 0437 01/23/15 0528  Weight: 118 lb 2.7 oz (53.6 kg) 116 lb 6.5 oz (52.8 kg) 122 lb 9.6 oz (55.611 kg)    Intake/Output:   Intake/Output Summary (Last 24 hours) at 01/23/15 0734 Last data filed at 01/23/15 0200  Gross per 24 hour  Intake   1020 ml  Output    171 ml  Net    849 ml     Physical Exam: General: Looks older than stated age. NAD. Seated in chair. HEENT: normal Neck: supple. JVP 8 cm. Carotids 2+ bilat; no bruits. No lymphadenopathy or thyromegaly. CVL out of L neck.  Site clean, dry, and intact.  Cor: PMI nondisplaced. RRR. No rubs, gallops or murmurs noted Lungs: Diminished throughout.  Abdomen: soft, NT, ND. No HSM noted. No bruits or masses. +BS Extremities: no cyanosis, clubbing, rash. Trace LE edema, R>L Neuro: alert & oriented x 3, cranial nerves grossly  intact. moves all 4 extremities w/o difficulty. Affect pleasant.   Telemetry: NSR 90s, ? Prolonged QT  Labs: CBC  Recent Labs  01/22/15 0500  WBC 9.0  HGB 7.8*  HCT 25.0*  MCV 94.7  PLT 152   Basic Metabolic Panel  Recent Labs  01/22/15 0500 01/23/15 0301  NA 135 130*  K 4.2 4.1  CL 98* 95*  CO2 26 28  GLUCOSE 175* 125*  BUN 24* 31*  CALCIUM 8.0* 7.9*  MG 2.0 2.2  PHOS 3.4 4.3   Liver Function Tests  Recent Labs  01/22/15 0500 01/23/15 0301  ALBUMIN 2.9* 2.5*   No results for input(s): LIPASE, AMYLASE in the last 72 hours. Cardiac Enzymes No results for input(s): CKTOTAL, CKMB, CKMBINDEX, TROPONINI in the last 72 hours.  BNP: BNP (last 3 results)  Recent Labs  01/14/15 1440  BNP 2160.6*    ProBNP (last 3 results)  Recent Labs  03/25/14 0918  PROBNP 284.0*     D-Dimer No results for input(s): DDIMER in the last 72 hours. Hemoglobin A1C No results for input(s): HGBA1C in the last 72 hours. Fasting Lipid Panel  Recent Labs  01/20/15 0908  TRIG 102   Thyroid Function Tests No results for input(s): TSH, T4TOTAL, T3FREE, THYROIDAB in the last 72 hours.  Invalid  input(s): FREET3  Other results:     Imaging/Studies:  Dg Chest 2 View  01/21/2015   CLINICAL DATA:  Cough.  EXAM: CHEST  2 VIEW  COMPARISON:  January 19, 2015.  FINDINGS: The heart size and mediastinal contours are within normal limits. Both lungs are clear. No pneumothorax is noted. Minimal bilateral pleural effusions are noted. The visualized skeletal structures are unremarkable.  IMPRESSION: Minimal bilateral pleural effusions. No other significant abnormality seen in the chest.   Electronically Signed   By: Lupita Raider, M.D.   On: 01/21/2015 20:27     Latest Echo  Latest Cath   Medications:     Scheduled Medications: . antiseptic oral rinse  7 mL Mouth Rinse BID  . aspirin  81 mg Oral Daily  . atorvastatin  10 mg Oral q1800  . carvedilol  6.25 mg Oral  BID WC  . darbepoetin (ARANESP) injection - NON-DIALYSIS  100 mcg Subcutaneous Q Fri-1800  . heparin subcutaneous  5,000 Units Subcutaneous 3 times per day  . insulin aspart  0-5 Units Subcutaneous QHS  . insulin aspart  0-9 Units Subcutaneous TID WC  . insulin glargine  10 Units Subcutaneous Daily  . isosorbide-hydrALAZINE  1 tablet Oral TID  . latanoprost  1 drop Both Eyes QHS     Infusions:     PRN Medications:  acetaminophen (TYLENOL) oral liquid 160 mg/5 mL, acetaminophen, GLUCERNA, ipratropium-albuterol   Assessment/Plan   Pamela [name] is a 64 y.o. female with history of CAD, DM, COPD, HTN, CKD stage IV, and hyperlipidemia here with hypoxic respiratory failure requiring intubation and sepsis from an unknown source, acute on chronic renal failure requiring HD, and acute on chronic systolic and diastolic heart failure.  1. Septic Shock: Reason for admission. - From presumed UTI per CCM note. - Finished course of rocephin. Blood and Urine cultures negative to date. UA negative 2. Acute renal failure on CKD Stage IV (Baseline Cr High 2s).  - Continues on HD.  - 3rd session 01/22/15 and tolerated well. Vascath removed 9/11 after final HD session.  - Nephrology following and plan to further assess need for HD in coming days. 3. Acute respiratory failure with COPD: Required short term intubation this admission.  - Significantly improved. Now on 02 via [place], unable to wean completely with sats in 80s. - Finished course of prednisone and rocephin. 4. Chronic systolic HF, EF 25-30%, ICM:  - Reduced from 35-40% on echo 11/15 and cath 12/15.  - Euvolemic after CVVHD and 3 x IHD.  - No diuretics currently. Was on 40 mg po lasix BID at home. Will leave up to renal for now.  - Baseline weight appears to be 112-114 previously.  122 today, though does not appear overloaded on exam. - Continue to titrate med as able. - Will continue to monitor fluid status and need for HD per  renal. - No ACE or ARB chronically with CKD. 5. CAD - Last cath 12/15. Severe disease first diagonal, medical treatment due to small caliber. Widely patent LAD stent, non-critical circumflex and RC disease. LVEF 35-40%. Medical management for now without ischemia evaluation given acute on chronic renal failure. - Troponin was negative on admission. - Continue ASA 81mg  daily and atorvastatin 6. DM - Per primary team  Length of Stay: [phone]  Pamela Freer PA-C 01/23/2015, 7:34 AM  Advanced Heart Failure Team Pager [phone] (M-F; 7a - 4p)  Please contact [facility] for night-coverage after hours (4p -7a )  and weekends on [email or web address]  1. AKI on CKD IV: In setting of infection/sepsis. She had last HD on Saturday.  Poor UOP now, short of breath and requiring oxygen.  Weight up.  - IV Lasix today per nephrology, follow for HD needs.  2. ID: Septic shock initially, uncertain source. Had Enterobacter in sputum. Not on antibiotics currently.  TEE showed no vegetation.  No PNA on 01/21/15 CXR.  3. Cardiomyopathy: EF 25-30%, down from 35-40%. Had known ischemic cardiomyopathy.  It is possible that she has a stress/sepsis cardiomyopathy superimposed on her pre-existing ischemic cardiomyopathy.  She has volume overload on exam and is requiring oxygen.  - Will need repeat echo after she has recovered from this event to look for improvement.  - Continue Bidil, increase Coreg to 9.375 mg bid.  - Will need Lasix IV, ordered by nephrology.  If Lasix is not effective and creatinine continues to rise, she may need to go back on HD.  4. CAD: See above, think this is stable. She is on ASA 81 and atorvastatin.  5. COPD: Baseline significant exertional dyspnea.   Marca Ancona 01/23/2015 1:36 PM

## 2015-01-23 NOTE — Progress Notes (Signed)
Patient ID: Pamela [name], female   DOB: 1951-03-14, 64 y.o.   MRN: [id number]  [name] Progress Note   Assessment/ Plan:   1. Acute on chronic renal failure versus rapidly advancing chronic kidney disease-status post recent renal biopsy (results pending) to determine etiology of her rapidly advancing kidney disease. Unfortunately complicated by recent hospitalization and required dialysis transiently. Overnight, with poor urine output recorded and rising BUN/creatinine-patient reports that quantification of urine output is likely inaccurate as she voided more than what was recorded. She was previously on furosemide which was held during this admission-will give intravenous furosemide today and restart oral furosemide tomorrow. Reassess daily for dialysis needs-without acute indications today 2. Sepsis- culture data negative to date-completed empiric antibiotic course for management of sepsis 3. Anemia- low but stable-ongoing treatment for anemia of chronic kidney disease-ESA . No overt loss following biopsy.    4. Secondary hyperparathyroidism- last PTH 102- no vit D- phos 3.7 5. HTN/volume- some volume overload- pressure is better- on norvasc and coreg- also determined to have poor EF  Subjective:   Reports some shortness of breath this morning and states that urine collection/quantification may have been inaccurate as she definitely voided more than 270 mL overnight.   Objective:   BP 135/65 mmHg  Pulse 93  Temp(Src) 98.5 F (36.9 C) (Oral)  Resp 18  Wt 55.611 kg (122 lb 9.6 oz)  SpO2 100%  Intake/Output Summary (Last 24 hours) at 01/23/15 0908 Last data filed at 01/23/15 0200  Gross per 24 hour  Intake    780 ml  Output    171 ml  Net    609 ml   Weight change: 2.011 kg (4 lb 6.9 oz)  Physical Exam: WGN:FAOZHYQ to be comfortable resting in recliner-husband at bedside  MVH:QIONG regular in rate and rhythm, S1 and S2 normal  Resp:Fine rales left base  otherwise clear, no rhonchi/wheeze  EXB:MWUX, flat, nontender Ext: No LE edema  Imaging: Dg Chest 2 View  01/21/2015   CLINICAL DATA:  Cough.  EXAM: CHEST  2 VIEW  COMPARISON:  January 19, 2015.  FINDINGS: The heart size and mediastinal contours are within normal limits. Both lungs are clear. No pneumothorax is noted. Minimal bilateral pleural effusions are noted. The visualized skeletal structures are unremarkable.  IMPRESSION: Minimal bilateral pleural effusions. No other significant abnormality seen in the chest.   Electronically Signed   By: Lupita Raider, M.D.   On: 01/21/2015 20:27    Labs: BMET  Recent Labs Lab 01/17/15 1640  01/18/15 0501  01/19/15 0403 01/19/15 0917 01/19/15 2207 01/20/15 0548 01/21/15 0347 01/22/15 0500 01/23/15 0301  NA 136  --  134*  --  133*  --   --  136 128* 135 130*  K 3.5  --  3.6  --  3.0*  --   --  3.3* 3.6 4.2 4.1  CL 89*  --  93*  --  93*  --   --  95* 92* 98* 95*  CO2 32  --  27  --  28  --   --  GLUCOSE 138*  --  206*  --  266*  --   --  173* 168* 175* 125*  BUN 6  --  15  --  39*  --   --  55* 56* 24* 31*  CREATININE 1.34*  --  1.54*  --  2.48*  --   --  3.76* 4.46* 3.21* 4.11*  CALCIUM 7.5*  --  7.8*  --  8.2*  --   --  8.3* 7.9* 8.0* 7.9*  PHOS 2.3*  < >  --   < > 3.5 3.7 3.2 3.6 3.7 3.4 4.3  < > = values in this interval not displayed. CBC  Recent Labs Lab 01/17/15 0418 01/18/15 0501 01/19/15 0404 01/22/15 0500  WBC 10.7* 9.1 12.1* 9.0  NEUTROABS  --  8.4* 10.8*  --   HGB 8.6* 8.6* 8.5* 7.8*  HCT 26.7* 26.6* 26.1* 25.0*  MCV 92.1 91.1 89.1 94.7  PLT 103* 103* 130* 152   Medications:    . antiseptic oral rinse  7 mL Mouth Rinse BID  . aspirin  81 mg Oral Daily  . atorvastatin  10 mg Oral q1800  . carvedilol  9.375 mg Oral BID WC  . darbepoetin (ARANESP) injection - NON-DIALYSIS  100 mcg Subcutaneous Q Fri-1800  . heparin subcutaneous  5,000 Units Subcutaneous 3 times per day  . insulin aspart  0-5 Units  Subcutaneous QHS  . insulin aspart  0-9 Units Subcutaneous TID WC  . insulin glargine  10 Units Subcutaneous Daily  . isosorbide-hydrALAZINE  1 tablet Oral TID  . latanoprost  1 drop Both Eyes QHS   Zetta Bills, MD 01/23/2015, 9:08 AM

## 2015-01-23 NOTE — Progress Notes (Signed)
Speech Language Pathology Treatment: Dysphagia  Patient Details Name: Pamela [name] MRN: [id number] DOB: [birth year] Today's Date: 01/23/2015 Time: 6244-6950 SLP Time Calculation (min) (ACUTE ONLY): 15 min  Assessment / Plan / Recommendation Clinical Impression  Pt was observed eating lunch, she independently demonstrated use of compensations (e.g. slow rate, small sips/bites) with no signs or symptoms of aspiration. SLP reviewed compensations, importance of upright posture, and aspiration precautions with family and patient. SLP will sign off, no f/u needed.   HPI Other Pertinent Information: Pt is a 64 year old female with PMH of stage 3 kidney disease, HTN, COPD, diabetes mellitus, and coronary artery disease. Pt had a biopsy on 9/1 for her left kidney and was admitted on 9/3 when she lost consciousness; prior to admission she had deteriorating mental status. Due to AMS, respiratory failure, septic shock, and renal failure, pt was intubated. CXR showed no gross acute abnormalities but improved left lower lung opacity, suggesting atelectasis. Pt was extubated on 01/15/15 at 9am and currently on a renal diet. During BSE on 01/21/15 pt showed no overt s/sx of aspiration, did have a mild wet vocal quality on the first swallow, reflexively cleared throat. Pt has multiple risk factors, on a regular solids, thin liquid diet.   Pertinent Vitals Pain Assessment: No/denies pain Faces Pain Scale: No hurt  SLP Plan  Discharge SLP treatment due to (comment) (diet tolerance)    Recommendations Diet recommendations: Regular;Thin liquid Liquids provided via: Straw Medication Administration: Whole meds with liquid Compensations: Slow rate;Small sips/bites              Plan: Discharge SLP treatment due to diet tolerance    GO    Riccardo Dubin, Student-SLP  Riccardo Dubin 01/23/2015, 1:58 PM

## 2015-01-23 NOTE — Progress Notes (Signed)
Physical Therapy Treatment Patient Details Name: Pamela [name] MRN: [id number] DOB: [birth year] Today's Date: 01/23/2015    History of Present Illness Pt is a 64 y/o female with a PMH of stage III kidney disease who had a biopsy done 9/1 of L kidney. For 24 hours PTA pt continued to deteriorate from a mental status standpoint and had a LOC on 9/3 and was brought to the ED. In the ED pt was noted to be in renal failure, was hyperkalemic, acidotic, and in respiratory failure due to AMS. She was intubated 9/3-9/4, and reintubated 9/6, extubated on 9/8.    PT Comments    Pt progressing with mobility, ambulated 120' with RW and supervision, though fatigues quickly second half of ambulation. HR elevated to 125 bpm with ambulation but pt tolerated standing exercises in front of chair with HR only in the low 100's. Recommend 4 wheel RW with seat for energy conservation for pt.  PT will continue to follow.   Follow Up Recommendations  Home health PT;Supervision/Assistance - 24 hour     Equipment Recommendations  3in1 (PT);Other (comment) (4 wheel RW with seat)    Recommendations for Other Services       Precautions / Restrictions Precautions Precautions: Fall Restrictions Weight Bearing Restrictions: No    Mobility  Bed Mobility               General bed mobility comments: pt received in chair  Transfers Overall transfer level: Modified independent Equipment used: Rolling walker (2 wheeled) Transfers: Sit to/from Stand Sit to Stand: Modified independent (Device/Increase time)         General transfer comment: practiced sit to stand with and without use of hands for strengthening and eccentric control with stand to sit. Encouraged pt to work on this at home as well for strengthening. Also discussed safety with transfers as pt tends to keep hold of RW when sitting  Ambulation/Gait Ambulation/Gait assistance: Supervision Ambulation Distance (Feet): 120 Feet Assistive  device: Rolling walker (2 wheeled) Gait Pattern/deviations: Step-through pattern;Decreased stride length;Narrow base of support;Shuffle;Trunk flexed Gait velocity: decreased Gait velocity interpretation: Below normal speed for age/gender (between 1.8 and 2.62 ft/sec) General Gait Details: worked on erect posture, pt fatigues quickly. O2 sats maintained at 95% on 2L but HR up to 125 bpm and pt with labored breathing so did not ambulate off of O2. Close supervision given last 36' as pt was very fatigued   Information systems manager [place] (Stroke Patients Only)       Balance Overall balance assessment: Needs assistance Sitting-balance support: No upper extremity supported Sitting balance-Leahy Scale: Good     Standing balance support: No upper extremity supported Standing balance-Leahy Scale: Fair Standing balance comment: pt can maintain static standing without UE support                    Cognition Arousal/Alertness: Awake/alert Behavior During Therapy: Flat affect Overall Cognitive Status: Within Functional Limits for tasks assessed                      Exercises General Exercises - Lower Extremity Hip Flexion/Marching: 20 reps;Standing Mini-Sqauts: Standing;10 reps    General Comments General comments (skin integrity, edema, etc.): O2 taken off for standing exercises and pt down to 94% after 5 mins.       Pertinent Vitals/Pain Pain Assessment: No/denies pain    Home Living  Prior Function            PT Goals (current goals can now be found in the care plan section) Acute Rehab PT Goals Patient Stated Goal: get better PT Goal Formulation: With family Time For Goal Achievement: 02/01/15 Potential to Achieve Goals: Good Progress towards PT goals: Progressing toward goals    Frequency  Min 3X/week    PT Plan Equipment recommendations need to be updated    Co-evaluation              End of Session Equipment Utilized During Treatment: Gait belt;Oxygen Activity Tolerance: Patient limited by fatigue Patient left: in chair;with call bell/phone within reach;with family/visitor present     Time: 1012-1040 PT Time Calculation (min) (ACUTE ONLY): 28 min  Charges:  $Gait Training: 8-22 mins $Therapeutic Activity: 8-22 mins                    G Codes:     Lyanne Co, PT  Acute Rehab Services  [phone]  Lyanne Co 01/23/2015, 1:03 PM

## 2015-01-23 NOTE — Care Management Important Message (Signed)
Important Message  Patient Details  Name: Pamela [name] MRN: [id number] Date of Birth: [birth year]   Medicare Important Message Given:  Yes-second notification given    Bernadette Hoit 01/23/2015, 12:48 PM

## 2015-01-23 NOTE — Progress Notes (Signed)
CM in to talk to patient about HHC options, patient does not want to talk about any HHC at this time. CM will continue to follow for DCP. Abelino Derrick [facility] [phone]

## 2015-01-23 NOTE — Progress Notes (Signed)
Patient Demographics:    Pamela [name], is a 64 y.o. female, DOB - June 13, 1950, RUE:[id number]  Admit date - 01/14/2015   Admitting Physician Alyson Reedy, MD  Outpatient Primary MD for the patient is Kristie Cowman, MD  LOS - 9   Chief Complaint  Patient presents with  . Fatigue       Brief summary  This is a pleasant 64 year old Caucasian female with history of hypertensive nephrosclerosis with CK D stage III under the care of Dr. Kathrene Bongo, understandable CAD with chronic combined diastolic and systolic heart failure EF around 30%, mild AS, essential hypertension, dyslipidemia who underwent an outpatient kidney biopsy on 01/12/2015 thereafter presented to the ER on 01/14/2015 for decreased mentation due to uremia, she was found to be in acute renal failure with hyperkalemia, developed respiratory failure due to combination of fluid overload along with failure to protect airway due to uremia. She was intubated and kept in ICU. She was seen by cardiology and nephrology. Right femoral Vas-Cath was placed and she was started on dialysis this admission. She underwent TTE along with TEE both confirming severe combined systolic and diastolic CHF however no evidence of endocarditis.  She was stabilized and transferred to hospitalist service under my care on day 7 of her hospital stay date 01/21/2015.    Subjective:    [name] today has, No headache, No chest pain, No abdominal pain - No Nausea, No new weakness tingling or numbness, No Cough - SOB.     Assessment  & Plan :     1. Acute hypoxic respiratory failure due to combination of toxic encephalopathy failure to protect airway, fluid overload. Requiring intubation and extubation 2, finally extubated on 01/20/2015. Pulmonary status appears  stable. No evidence of pneumonia. Mentation has improved. Try to titrate off oxygen, supportive care with Neb treatments as needed.  2. ARF on CK D stage III. Underlying problem thought to be hypertensive nephrosclerosis, renal on board, was dialyzed via right femoral Vas-Cath. Defer management of this problem to nephrology. She may require future dialysis and renal function will be monitored closely.  3. Acute on chronic combined diastolic and systolic heart failure with ischemic cardiomyopathy. EF 30%. Underwent TTE and TEE, cardiology on board, fluid removal via dialysis, continue Coreg along with BiDil. Has unstentable CAD. Have added low-dose aspirin as well.  4. Questionable sepsis. No clear source, all cultures negative, UA was clear, chest x-ray was clear, one of her sputum cultures grew Enterobacter cloacae, in ICU was tapered down to Rocephin which she finished on 01/21/2015. Will monitor temperature curve and CBC, stable repeat 2 view chest x-ray on 01/21/2015.  5. History of COPD. No wheezing, nebulizer treatments as needed, try to titrate off oxygen.  6. Toxic encephalopathy due to uremia. Resolved plan as in #1 above.  7. Dyslipidemia. Statin has been switched by cardiology.  8. Left cephalic vein SVT noted on ultrasound done in ICU on 01/18/2015. Supportive care. Discussed with vascular surgeon on call Dr. Cherly Hensen. No further evaluation or treatment for SVT. No line in left arm.   9. DM type II. Check A1c place on sliding scale for now. Avoid oral hypoglycemics for now. Low-dose Lantus added with better control.  No results found for: HGBA1C  CBG (last 3)   Recent Labs  01/22/15 1624 01/22/15 2221 01/23/15 0533  GLUCAP 309* 193* 114*      Code Status : Partial  Family Communication  : Husband bedside  Disposition Plan  : Be decided  Consults  :  PCCM, renal, cardiology  Procedures  :   9/3 intubated and renal consult called. 9/5- neg 2.2 liters, cvvhd 9/6-  increase urine output, ETT placed 9/7 doppler>>>neg 9/7 echo>>>25-30%, pa 40, thick aortic 9/8- extubated 9/9 tee>>>severely depressed, AV with very mild calcification. No obvious vegetation  9/9 - urine output increased 9/7 - Doppler obtained in ICU positive for left cephalic vein SVT.   DVT Prophylaxis  :  Heparin   Lab Results  Component Value Date   PLT 152 01/22/2015    Inpatient Medications  Scheduled Meds: . antiseptic oral rinse  7 mL Mouth Rinse BID  . aspirin  81 mg Oral Daily  . atorvastatin  10 mg Oral q1800  . carvedilol  9.375 mg Oral BID WC  . darbepoetin (ARANESP) injection - NON-DIALYSIS  100 mcg Subcutaneous Q Fri-1800  . furosemide  80 mg Intravenous Once  . heparin subcutaneous  5,000 Units Subcutaneous 3 times per day  . insulin aspart  0-5 Units Subcutaneous QHS  . insulin aspart  0-9 Units Subcutaneous TID WC  . insulin glargine  10 Units Subcutaneous Daily  . isosorbide-hydrALAZINE  1 tablet Oral TID  . latanoprost  1 drop Both Eyes QHS   Continuous Infusions:  PRN Meds:.acetaminophen (TYLENOL) oral liquid 160 mg/5 mL, acetaminophen, GLUCERNA, ipratropium-albuterol  Antibiotics  :     Anti-infectives    Start     Dose/Rate Route Frequency Ordered Stop   01/17/15 1000  cefTRIAXone (ROCEPHIN) 1 g in dextrose 5 % 50 mL IVPB     1 g 100 mL/hr over 30 Minutes Intravenous Every 24 hours 01/17/15 0901 01/21/15 1206   01/15/15 1600  vancomycin (VANCOCIN) IVPB 750 mg/150 ml premix  Status:  Discontinued     750 mg 150 mL/hr over 60 Minutes Intravenous Every 24 hours 01/15/15 0851 01/16/15 0909   01/15/15 0100  piperacillin-tazobactam (ZOSYN) IVPB 2.25 g  Status:  Discontinued     2.25 g 100 mL/hr over 30 Minutes Intravenous Every 8 hours 01/14/15 1614 01/14/15 2152   01/14/15 2200  piperacillin-tazobactam (ZOSYN) IVPB 2.25 g  Status:  Discontinued     2.25 g 100 mL/hr over 30 Minutes Intravenous 4 times per day 01/14/15 2152 01/17/15 0901   01/14/15  1600  vancomycin (VANCOCIN) IVPB 1000 mg/200 mL premix     1,000 mg 200 mL/hr over 60 Minutes Intravenous  Once 01/14/15 1506 01/14/15 1708   01/14/15 1600  piperacillin-tazobactam (ZOSYN) IVPB 2.25 g  Status:  Discontinued     2.25 g 100 mL/hr over 30 Minutes Intravenous  Once 01/14/15 1506 01/14/15 1512   01/14/15 1512  piperacillin-tazobactam (ZOSYN) IVPB 3.375 g     3.375 g 100 mL/hr over 30 Minutes Intravenous  Once 01/14/15 1512 01/14/15 1624        Objective:   Filed Vitals:   01/23/15 0252 01/23/15 0528 01/23/15 0826 01/23/15 0900  BP: 128/57 134/48 135/65 136/60  Pulse: 91 96 93 76  Temp: 98.2 F (36.8 C) 98.7 F (37.1 C) 98.5 F (36.9 C) 98.6 F (37 C)  TempSrc: Oral Oral Oral Oral  Resp: 18 18 18 20   Weight:  55.611 kg (122 lb 9.6 oz)    SpO2: 100% 100%  100%     Wt Readings from Last 3 Encounters:  01/23/15 55.611 kg (122 lb 9.6 oz)  01/12/15 50.803 kg (112 lb)  10/24/14 51.982 kg (114 lb 9.6 oz)     Intake/Output Summary (Last 24 hours) at 01/23/15 0946 Last data filed at 01/23/15 0944  Gross per 24 hour  Intake    900 ml  Output    171 ml  Net    729 ml     Physical Exam  Awake Alert, Oriented X 3, No new F.N deficits, Normal affect [email or web address].AT,PERRAL Supple Neck,No JVD, No cervical lymphadenopathy appriciated.  Symmetrical Chest wall movement, Good air movement bilaterally, CTAB RRR,No Gallops,Rubs or new Murmurs, No Parasternal Heave +ve B.Sounds, Abd Soft, No tenderness, No organomegaly appriciated, No rebound - guarding or rigidity. No Cyanosis, Clubbing or edema, No new Rash or bruise        Data Review:   Micro Results Recent Results (from the past 240 hour(s))  Culture, blood (routine x 2)     Status: None   Collection Time: 01/14/15  2:45 PM  Result Value Ref Range Status   Specimen Description BLOOD LEFT WRIST  Final   Special Requests BOTTLES DRAWN AEROBIC AND ANAEROBIC 5CC  Final   Culture NO GROWTH 5 DAYS  Final   Report Status  01/19/2015 FINAL  Final  Culture, blood (routine x 2)     Status: None   Collection Time: 01/14/15  2:55 PM  Result Value Ref Range Status   Specimen Description BLOOD RIGHT HAND  Final   Special Requests BOTTLES DRAWN AEROBIC AND ANAEROBIC  Final   Culture NO GROWTH 5 DAYS  Final   Report Status 01/19/2015 FINAL  Final  MRSA PCR Screening     Status: None   Collection Time: 01/14/15  6:00 PM  Result Value Ref Range Status   MRSA by PCR NEGATIVE NEGATIVE Final    Comment:        The GeneXpert MRSA Assay (FDA approved for NASAL specimens only), is one component of a comprehensive MRSA colonization surveillance program. It is not intended to diagnose MRSA infection nor to guide or monitor treatment for MRSA infections.   Culture, respiratory (NON-Expectorated)     Status: None   Collection Time: 01/14/15  6:03 PM  Result Value Ref Range Status   Specimen Description TRACHEAL ASPIRATE  Final   Special Requests NONE  Final   Gram Stain   Final    NO WBC SEEN NO SQUAMOUS EPITHELIAL CELLS SEEN NO ORGANISMS SEEN Performed at [facility]    Culture   Final    FEW ENTEROBACTER CLOACAE Performed at [facility]    Report Status 01/16/2015 FINAL  Final   Organism ID, Bacteria ENTEROBACTER CLOACAE  Final      Susceptibility   Enterobacter cloacae - MIC*    CEFAZOLIN >=64 RESISTANT Resistant     CEFEPIME <=1 SENSITIVE Sensitive     CEFTAZIDIME <=1 SENSITIVE Sensitive     CEFTRIAXONE <=1 SENSITIVE Sensitive     CIPROFLOXACIN <=0.25 SENSITIVE Sensitive     GENTAMICIN <=1 SENSITIVE Sensitive     IMIPENEM 0.5 SENSITIVE Sensitive     PIP/TAZO <=4 SENSITIVE Sensitive     TOBRAMYCIN <=1 SENSITIVE Sensitive     TRIMETH/SULFA <=20 SENSITIVE Sensitive     * FEW ENTEROBACTER CLOACAE  Urine culture     Status: None   Collection Time: 01/15/15 10:05 PM  Result Value Ref Range Status  Specimen Description URINE, CATHETERIZED  Final   Special Requests NONE  Final    Culture NO GROWTH 2 DAYS  Final   Report Status 01/17/2015 FINAL  Final  Culture, blood (routine x 2)     Status: None (Preliminary result)   Collection Time: 01/18/15  4:40 PM  Result Value Ref Range Status   Specimen Description BLOOD RIGHT ARM  Final   Special Requests BOTTLES DRAWN AEROBIC ONLY 5CC  Final   Culture NO GROWTH 4 DAYS  Final   Report Status PENDING  Incomplete  Culture, blood (routine x 2)     Status: None (Preliminary result)   Collection Time: 01/18/15  4:45 PM  Result Value Ref Range Status   Specimen Description BLOOD LEFT ARM  Final   Special Requests BOTTLES DRAWN AEROBIC ONLY 5CC  Final   Culture NO GROWTH 4 DAYS  Final   Report Status PENDING  Incomplete  Urine culture     Status: None   Collection Time: 01/21/15  9:00 AM  Result Value Ref Range Status   Specimen Description URINE, CLEAN CATCH  Final   Special Requests NONE  Final   Culture NO GROWTH 1 DAY  Final   Report Status 01/22/2015 FINAL  Final    Radiology Reports Dg Chest 2 View  01/21/2015   CLINICAL DATA:  Cough.  EXAM: CHEST  2 VIEW  COMPARISON:  January 19, 2015.  FINDINGS: The heart size and mediastinal contours are within normal limits. Both lungs are clear. No pneumothorax is noted. Minimal bilateral pleural effusions are noted. The visualized skeletal structures are unremarkable.  IMPRESSION: Minimal bilateral pleural effusions. No other significant abnormality seen in the chest.   Electronically Signed   By: Lupita Raider, M.D.   On: 01/21/2015 20:27   US Renal  01/14/2015   CLINICAL DATA:  Renal failure. History of hypertension, COPD, diabetes.  EXAM: RENAL / URINARY TRACT ULTRASOUND COMPLETE  COMPARISON:  10/21/2014  FINDINGS: Right Kidney:  Length: 9.9 cm. Renal parenchyma is echogenic. Multiple small cysts are identified. The largest is seen in the lower pole region measuring 1.1 cm. No hydronephrosis.  Left Kidney:  Length: 10.2 cm. Echogenic renal parenchyma. Multiple small cysts are  identified, largest in the lower pole region which measures 1.3 cm.  Bladder:  A Foley catheter decompresses the bladder.  Additional:  Note is made of a small amount of ascites.  IMPRESSION: 1. Echogenic kidneys bilaterally.  Bilateral renal cysts. 2. No hydronephrosis. 3. Small amount of ascites noted.   Electronically Signed   By: Norva Pavlov M.D.   On: 01/14/2015 17:44   US Biopsy  01/12/2015   INDICATION: Proteinuria of uncertain etiology. Please perform ultrasound-guided biopsy for tissue diagnostic purposes.  EXAM: ULTRASOUND GUIDED RENAL BIOPSY  COMPARISON:  Renal ultrasound - 10/21/2014  MEDICATIONS: Fentanyl 50 mcg IV; Versed 1 mg IV  ANESTHESIA/SEDATION: Total Moderate Sedation time  15 minutes  COMPLICATIONS: SIR level A: No therapy, no consequence.  Procedure complicated by development of a tiny, asymptomatic, non enlarging left-sided perinephric hematoma.  PROCEDURE: Informed written consent was obtained from the patient after a discussion of the risks, benefits and alternatives to treatment. The patient understands and consents the procedure. A timeout was performed prior to the initiation of the procedure.  Ultrasound scanning was performed of the bilateral flanks. The inferior pole of the left kidney was selected for biopsy due to location and sonographic window. The procedure was planned. The operative site was prepped  and draped in the usual sterile fashion. The overlying soft tissues were anesthetized with 1% lidocaine with epinephrine. A 17 gauge core needle biopsy device was advanced into the inferior cortex of the left kidney and 3 core biopsies were obtained under direct ultrasound guidance. Real time pathologic review confirmed adequate tissue acquisition. Images were saved for documentation purposes. The biopsy device was removed and hemostasis was obtained with manual compression.  Postprocedural scanning and demonstrated development of a very tiny perinephric hematoma which was  noted to not enlarged on the acquired delayed postprocedural imaging (representative images 8 and 9). Post procedural scanning was negative for significant post procedural hemorrhage or additional complication. A dressing was placed. The patient tolerated the procedure well without immediate post procedural complication.  IMPRESSION: Technically successful ultrasound guided left renal biopsy.   Electronically Signed   By: Simonne Come M.D.   On: 01/12/2015 13:57   Dg Chest Port 1 View  01/23/2015   CLINICAL DATA:  Shortness of breath  EXAM: PORTABLE CHEST - 1 VIEW  COMPARISON:  January 21, 2015  FINDINGS: The heart size and mediastinal contours are within normal limits. There is no focal pneumonia or pulmonary edema. Minimal right pleural effusion is identified. The visualized skeletal structures are stable.  IMPRESSION: Minimal right pleural effusion.  No focal pneumonia.   Electronically Signed   By: Sherian Rein M.D.   On: 01/23/2015 09:40   Dg Chest Port 1 View  01/19/2015   CLINICAL DATA:  Renal failure.  EXAM: PORTABLE CHEST - 1 VIEW  COMPARISON:  01/17/2015.  FINDINGS: Endotracheal tube, left IJ line, NG tube in stable position. Heart size is stable. Bibasilar pulmonary infiltrates consistent pneumonia. Minimal interval improvement. No pleural effusion or pneumothorax.  IMPRESSION: 1. Lines and tubes in stable position. 2. Bibasilar infiltrates consistent with pneumonia. Minimal interval improvement.   Electronically Signed   By: Maisie Fus  Register   On: 01/19/2015 07:21   Dg Chest Port 1 View  01/17/2015   CLINICAL DATA:  Ventilator dependent respiratory failure. Followup left basilar atelectasis.  EXAM: PORTABLE CHEST - 1 VIEW  COMPARISON:  01/15/2015 and earlier.  FINDINGS: Endotracheal tube tip in satisfactory position approximately 4 cm above the carina. Nasogastric tube courses below the diaphragm into the stomach though its tip is not included on the image. Left jugular central venous catheter  tip projects over the mid to lower SVC.  Cardiac silhouette normal in size, unchanged. Airspace opacities involving the lung bases bilaterally, right greater than left, progressive since the examination 2 days ago. Pulmonary vascularity normal. Possible small bilateral pleural effusions.  IMPRESSION: 1. Support apparatus satisfactory. 2. Pneumonia involving the lung bases, right greater than left, progressive since the examination 2 days ago. 3. Possible small bilateral pleural effusions.   Electronically Signed   By: Hulan Saas M.D.   On: 01/17/2015 11:28   Dg Chest Port 1 View  01/15/2015   CLINICAL DATA:  Septic shock, respiratory failure and acute renal failure. Question left lower lobe pneumonia.  EXAM: PORTABLE CHEST - 1 VIEW  COMPARISON:  01/14/2015  FINDINGS: Endotracheal tube with tip 3.2 cm above the carina, left IJ central venous catheter with tip overlying the superior cavoatrial junction NG tube entering the stomach with tip off the field of view noted.  Left lower lung opacity has improved.  There is no evidence of pleural effusion or pneumothorax.  IMPRESSION: Improved left lower lung opacity, suggesting atelectasis.  No other significant change.   Electronically Signed  By: Harmon Pier M.D.   On: 01/15/2015 07:32   Dg Chest Port 1 View  01/14/2015   CLINICAL DATA:  Septic shock, respiratory failure and acute renal failure. Endotracheal tube and central line placement.  EXAM: PORTABLE CHEST - 1 VIEW  COMPARISON:  01/14/2015 and prior radiographs  FINDINGS: The cardiomediastinal silhouette is unchanged.  An endotracheal tube with tip 5.1 cm above the carina, left IJ central venous catheter with tip overlying the lower SVC and NG tube entering the stomach with tip off the field of view are noted.  Left lower lung airspace disease is present.  There is no evidence of pneumothorax or pleural effusion.  IMPRESSION: Left lower lung airspace disease/ pneumonia.  Support apparatus as described.  No  evidence of pneumothorax.   Electronically Signed   By: Harmon Pier M.D.   On: 01/14/2015 16:38   Dg Chest Portable 1 View  01/14/2015   CLINICAL DATA:  Sepsis.  EXAM: PORTABLE CHEST - 1 VIEW  COMPARISON:  03/25/2014.  FINDINGS: Interval borderline enlargement of the cardiac silhouette and mild increase in prominence of the interstitial markings, accentuated by a decreased inspiration. Calcified granuloma in the right lower lung zone. No pleural fluid. Diffuse osteopenia.  IMPRESSION: Interval borderline cardiomegaly and mildly increased prominence of the interstitial markings, most likely due to a poor inspiration. No gross acute abnormality.   Electronically Signed   By: Beckie Salts M.D.   On: 01/14/2015 15:21   Dg Abd Portable 1v  01/17/2015   CLINICAL DATA:  Nasogastric tube placement  EXAM: PORTABLE ABDOMEN - 1 VIEW  COMPARISON:  None.  FINDINGS: Orogastric tube with the tip projecting over the antrum of the stomach. There is no bowel dilatation to suggest obstruction. There is no evidence of pneumoperitoneum, portal venous gas or pneumatosis. There are no pathologic calcifications along the expected course of the ureters.The osseous structures are unremarkable.  IMPRESSION: Orogastric tube with the tip projecting over the antrum of the stomach.   Electronically Signed   By: Elige Ko   On: 01/17/2015 11:19     CBC  Recent Labs Lab 01/17/15 0418 01/18/15 0501 01/19/15 0404 01/22/15 0500  WBC 10.7* 9.1 12.1* 9.0  HGB 8.6* 8.6* 8.5* 7.8*  HCT 26.7* 26.6* 26.1* 25.0*  PLT 103* 103* 130* 152  MCV 92.1 91.1 89.1 94.7  MCH 29.7 29.5 29.0 29.5  MCHC 32.2 32.3 32.6 31.2  RDW 16.5* 16.0* 15.9* 16.7*  LYMPHSABS  --  0.4* 0.6*  --   MONOABS  --  0.3 0.7  --   EOSABS  --  0.0 0.0  --   BASOSABS  --  0.0 0.0  --     Chemistries   Recent Labs Lab 01/18/15 0501  01/19/15 0403  01/19/15 2207 01/20/15 0548 01/21/15 0347 01/22/15 0500 01/23/15 0301  NA 134*  --  133*  --   --  136 128*  135 130*  K 3.6  --  3.0*  --   --  3.3* 3.6 4.2 4.1  CL 93*  --  93*  --   --  95* 92* 98* 95*  CO2 27  --  28  --   --  29 28 26 28   GLUCOSE 206*  --  266*  --   --  173* 168* 175* 125*  BUN 15  --  39*  --   --  55* 56* 24* 31*  CREATININE 1.54*  --  2.48*  --   --  3.76* 4.46* 3.21* 4.11*  CALCIUM 7.8*  --  8.2*  --   --  8.3* 7.9* 8.0* 7.9*  MG 2.3  < > 2.3  < > 2.2 2.3 2.2 2.0 2.2  AST 26  --   --   --   --   --   --   --   --   ALT 19  --   --   --   --   --   --   --   --   ALKPHOS 45  --   --   --   --   --   --   --   --   BILITOT 1.2  --   --   --   --   --   --   --   --   < > = values in this interval not displayed. ------------------------------------------------------------------------------------------------------------------ estimated creatinine clearance is 10.4 mL/min (by C-G formula based on Cr of 4.11). ------------------------------------------------------------------------------------------------------------------ No results for input(s): HGBA1C in the last 72 hours. ------------------------------------------------------------------------------------------------------------------ No results for input(s): CHOL, HDL, LDLCALC, TRIG, CHOLHDL, LDLDIRECT in the last 72 hours. ------------------------------------------------------------------------------------------------------------------ No results for input(s): TSH, T4TOTAL, T3FREE, THYROIDAB in the last 72 hours.  Invalid input(s): FREET3 ------------------------------------------------------------------------------------------------------------------ No results for input(s): VITAMINB12, FOLATE, FERRITIN, TIBC, IRON, RETICCTPCT in the last 72 hours.  Coagulation profile  Recent Labs Lab 01/17/15 0418  INR 1.23    No results for input(s): DDIMER in the last 72 hours.  Cardiac Enzymes No results for input(s): CKMB, TROPONINI, MYOGLOBIN in the last 168 hours.  Invalid input(s):  CK ------------------------------------------------------------------------------------------------------------------ Invalid input(s): POCBNP   Time Spent in minutes   35   Susa Raring K M.D on 01/23/2015 at 9:46 AM  Between 7am to 7pm - Pager - [phone]  After 7pm go to [email or web address] - password [facility]  [facility] -  Office  [phone]

## 2015-01-24 ENCOUNTER — Encounter (HOSPITAL_COMMUNITY): Payer: Self-pay | Admitting: Radiology

## 2015-01-24 DIAGNOSIS — L8990 Pressure ulcer of unspecified site, unspecified stage: Secondary | ICD-10-CM | POA: Insufficient documentation

## 2015-01-24 LAB — RENAL FUNCTION PANEL
ALBUMIN: 2.4 g/dL — AB (ref 3.5–5.0)
Anion gap: 9 (ref 5–15)
BUN: 36 mg/dL — ABNORMAL HIGH (ref 6–20)
CHLORIDE: 88 mmol/L — AB (ref 101–111)
CO2: 26 mmol/L (ref 22–32)
CREATININE: 5.06 mg/dL — AB (ref 0.44–1.00)
Calcium: 7.6 mg/dL — ABNORMAL LOW (ref 8.9–10.3)
GFR, EST AFRICAN AMERICAN: 10 mL/min — AB (ref >60)
GFR, EST NON AFRICAN AMERICAN: 8 mL/min — AB (ref >60)
Glucose, Bld: 143 mg/dL — ABNORMAL HIGH (ref 65–99)
PHOSPHORUS: 4.9 mg/dL — AB (ref 2.5–4.6)
Potassium: 4.4 mmol/L (ref 3.5–5.1)
Sodium: 123 mmol/L — ABNORMAL LOW (ref 135–145)

## 2015-01-24 LAB — GLUCOSE, CAPILLARY
GLUCOSE-CAPILLARY: 142 mg/dL — AB (ref 65–99)
GLUCOSE-CAPILLARY: 256 mg/dL — AB (ref 65–99)
GLUCOSE-CAPILLARY: 162 mg/dL — AB (ref 65–99)
Glucose-Capillary: 171 mg/dL — ABNORMAL HIGH (ref 65–99)

## 2015-01-24 LAB — MAGNESIUM: Magnesium: 2.1 mg/dL (ref 1.7–2.4)

## 2015-01-24 MED ORDER — DM-GUAIFENESIN ER 30-600 MG PO TB12
1.0000 | ORAL_TABLET | Freq: Two times a day (BID) | ORAL | Status: DC
  Administered 2015-01-24 – 2015-02-01 (×16): 1 via ORAL
  Filled 2015-01-24 (×16): qty 1

## 2015-01-24 MED ORDER — FUROSEMIDE 10 MG/ML IJ SOLN
80.0000 mg | Freq: Two times a day (BID) | INTRAMUSCULAR | Status: DC
  Administered 2015-01-24 – 2015-01-25 (×4): 80 mg via INTRAVENOUS
  Filled 2015-01-24 (×5): qty 8

## 2015-01-24 NOTE — Progress Notes (Signed)
Advanced Heart Failure Rounding Note  PCP: Dr Kristie Cowman Primary Cardiologist: Dr Verdis Prime  Subjective:    Had last HD Saturday prior to having vascath removed.  Nephro to follow to assess need for ongoing HD.    Feels OK.  Breathing OK at rest but gets SOB walking around room.  Had to do PT exercises in chair yesterday.  Had episode of dizziness, SOB, and palpitations yesterday during PT, with HR up into 130s per pt.  Feels like she peed "a lot" yesterday.  Out 500 mL yesterday.  Weight down 1 lb with 80 mg IV lasix x 1 yesterday with plans to switch to po today if stable. Cr trending up. BP stable currently.  Objective:   Weight Range: 121 lb 6.4 oz (55.067 kg) Body mass index is 22.95 kg/(m^2).   Vital Signs:   Temp:  [97.9 F (36.6 C)-98.6 F (37 C)] 97.9 F (36.6 C) (09/13 0516) Pulse Rate:  [76-105] 94 (09/13 0516) Resp:  [18-20] 18 (09/13 0516) BP: (107-136)/(45-67) 119/45 mmHg (09/13 0516) SpO2:  [98 %-100 %] 98 % (09/13 0516) Weight:  [121 lb 6.4 oz (55.067 kg)] 121 lb 6.4 oz (55.067 kg) (09/13 0516) Last BM Date: 01/23/15  Weight change: Filed Weights   01/22/15 0437 01/23/15 0528 01/24/15 0516  Weight: 116 lb 6.5 oz (52.8 kg) 122 lb 9.6 oz (55.611 kg) 121 lb 6.4 oz (55.067 kg)    Intake/Output:   Intake/Output Summary (Last 24 hours) at 01/24/15 0731 Last data filed at 01/23/15 2300  Gross per 24 hour  Intake    600 ml  Output    500 ml  Net    100 ml     Physical Exam: General: Looks older than stated age. NAD. Reclined in bed, eating breakfast. HEENT: normal Neck: supple. JVP 8 cm. Carotids 2+ bilat; no bruits. No lymphadenopathy or thyromegaly noted. Cor: PMI nondisplaced. RRR. No rubs, gallops or murmurs. Lungs: Diminished Abdomen: soft, non tender, non distended. No HSM noted. No bruits or masses. +BS Extremities: no cyanosis, clubbing, rash. 1+ ankle edema.  Neuro: alert & oriented x 3, cranial nerves grossly intact. moves all 4  extremities w/o difficulty. Affect pleasant.  Telemetry: NSR 90s  Labs: CBC  Recent Labs  01/22/15 0500  WBC 9.0  HGB 7.8*  HCT 25.0*  MCV 94.7  PLT 152   Basic Metabolic Panel  Recent Labs  01/23/15 0301 01/24/15 0302  NA 130* 123*  K 4.1 4.4  CL 95* 88*  CO2 28 26  GLUCOSE 125* 143*  BUN 31* 36*  CALCIUM 7.9* 7.6*  MG 2.2 2.1  PHOS 4.3 4.9*   Liver Function Tests  Recent Labs  01/23/15 0301 01/24/15 0302  ALBUMIN 2.5* 2.4*   No results for input(s): LIPASE, AMYLASE in the last 72 hours. Cardiac Enzymes No results for input(s): CKTOTAL, CKMB, CKMBINDEX, TROPONINI in the last 72 hours.  BNP: BNP (last 3 results)  Recent Labs  01/14/15 1440  BNP 2160.6*    ProBNP (last 3 results)  Recent Labs  03/25/14 0918  PROBNP 284.0*     D-Dimer No results for input(s): DDIMER in the last 72 hours. Hemoglobin A1C  Recent Labs  01/21/15 1306  HGBA1C 6.8*   Fasting Lipid Panel No results for input(s): CHOL, HDL, LDLCALC, TRIG, CHOLHDL, LDLDIRECT in the last 72 hours. Thyroid Function Tests No results for input(s): TSH, T4TOTAL, T3FREE, THYROIDAB in the last 72 hours.  Invalid input(s): FREET3  Other results:  Imaging/Studies:  Dg Chest Port 1 View  01/23/2015   CLINICAL DATA:  Shortness of breath  EXAM: PORTABLE CHEST - 1 VIEW  COMPARISON:  January 21, 2015  FINDINGS: The heart size and mediastinal contours are within normal limits. There is no focal pneumonia or pulmonary edema. Minimal right pleural effusion is identified. The visualized skeletal structures are stable.  IMPRESSION: Minimal right pleural effusion.  No focal pneumonia.   Electronically Signed   By: Sherian Rein M.D.   On: 01/23/2015 09:40    Latest Echo  Latest Cath   Medications:     Scheduled Medications: . antiseptic oral rinse  7 mL Mouth Rinse BID  . aspirin  81 mg Oral Daily  . atorvastatin  10 mg Oral q1800  . carvedilol  9.375 mg Oral BID WC  .  darbepoetin (ARANESP) injection - NON-DIALYSIS  100 mcg Subcutaneous Q Fri-1800  . heparin subcutaneous  5,000 Units Subcutaneous 3 times per day  . insulin aspart  0-5 Units Subcutaneous QHS  . insulin aspart  0-9 Units Subcutaneous TID WC  . insulin glargine  10 Units Subcutaneous Daily  . isosorbide-hydrALAZINE  1 tablet Oral TID  . latanoprost  1 drop Both Eyes QHS    Infusions:    PRN Medications: acetaminophen (TYLENOL) oral liquid 160 mg/5 mL, acetaminophen, GLUCERNA, ipratropium-albuterol   Assessment/Plan   Pamela [name] is a 64 y.o. female with history of CAD, DM, COPD, HTN, CKD stage IV, and hyperlipidemia here with hypoxic respiratory failure requiring intubation and sepsis from an unknown source, acute on chronic renal failure requiring HD, and acute on chronic systolic and diastolic heart failure.  1. Septic Shock: Reason for admission. - From presumed UTI per CCM note. - Finished course of rocephin. Blood and Urine cultures negative. UA negative 2. Acute renal failure on CKD Stage IV (Baseline Cr High 2s).  - Cr continues to rise with poor UO.  Will await renal decision on HD.  - 3rd session of HD 01/22/15 and tolerated well. Vascath removed 9/11 after final HD session.  - Nephrology following and plan to further assess need for HD in coming days. 3. Acute respiratory failure with COPD: Required short term intubation this admission.  - Significantly improved. Now on 02 via [place], unable to wean completely with sats in 80s. - Finished course of prednisone and rocephin. 4. Chronic systolic HF, EF 25-30%, ICM:  - Reduced from 35-40% on echo 11/15 and cath 12/15.  - May be stress/sepsis CMP, will need follow up Echo outpatient for reassessment. - May have some fluid on board but diuresis difficult with increasing Cr. - Received 1 dose 80 mg IV lasix yesterday. Poor diuresis and Cr continues to rise. Renal had planned on resuming po lasix today but pt may need HD  going forward. - Baseline weight appears to be 112-114 previously.  121 today. - Continue to titrate meds as able. - Continue Bidil. Coreg increased to 9.375 yesterday. - Will continue to monitor fluid status and need for HD per renal. - No ACE or ARB chronically with CKD. 5. CAD - Last cath 12/15. Severe disease first diagonal, medical treatment due to small caliber. Widely patent LAD stent, non-critical circumflex and RC disease. LVEF 35-40%. Medical management for now without ischemia evaluation given acute on chronic renal failure. - Troponin was negative on admission. - Continue ASA 81mg  daily and atorvastatin 6. DM - Per primary team  Length of Stay: 10  Graciella Freer PA-C 01/24/2015,  7:31 AM  Advanced Heart Failure Team Pager [phone] (M-F; 7a - 4p)  Please contact [facility] for night-coverage after hours (4p -7a ) and weekends on [email or web address]  Patient seen with PA, agree with the above note.  1. AKI on CKD IV: In setting of infection/sepsis. She had last HD on Saturday. UOP a little better yesterday but not markedly better. Short of breath and requiring oxygen. Weight down 1 lb.  Creatinine up to 5.  - Will give Lasix 80 mg IV bid today.  - Monitoring off HD for now, renal following closely.  2. ID: Septic shock initially, uncertain source. Had Enterobacter in sputum. Not on antibiotics currently. TEE showed no vegetation. No PNA on 01/21/15 CXR.  3. Cardiomyopathy: EF 25-30%, down from 35-40%. Had known ischemic cardiomyopathy. It is possible that she has a stress/sepsis cardiomyopathy superimposed on her pre-existing ischemic cardiomyopathy. She has volume overload on exam and is requiring oxygen.  - Will need repeat echo after she has recovered from this event to look for improvement.  - Continue Bidil + Coreg, would not increase doses as BP is lower today than it has been.  - Lasix 80 mg IV bid x 2 doses and reassess. 4. CAD: See above, think  this is stable. She is on ASA 81 and atorvastatin.  5. COPD: Baseline significant exertional dyspnea. 6. Hyponatremia: Suspect hypervolemic hypernatremia.  Getting Lasix, fluid restrict.   Marca Ancona 01/24/2015 9:24 AM

## 2015-01-24 NOTE — Progress Notes (Signed)
Patient Demographics:    Pamela [name], is a 64 y.o. female, DOB - Mar 22, 1951, WUJ:[id number]  Admit date - 01/14/2015   Admitting Physician Alyson Reedy, MD  Outpatient Primary MD for the patient is Kristie Cowman, MD  LOS - 10   Chief Complaint  Patient presents with  . Fatigue       Brief summary  This is a pleasant 64 year old Caucasian female with history of hypertensive nephrosclerosis with CK D stage III under the care of Dr. Kathrene Bongo, understandable CAD with chronic combined diastolic and systolic heart failure EF around 30%, mild AS, essential hypertension, dyslipidemia who underwent an outpatient kidney biopsy on 01/12/2015 thereafter presented to the ER on 01/14/2015 for decreased mentation due to uremia, she was found to be in acute renal failure with hyperkalemia, developed respiratory failure due to combination of fluid overload along with failure to protect airway due to uremia. She was intubated and kept in ICU. She was seen by cardiology and nephrology. Right femoral Vas-Cath was placed and she was started on dialysis this admission. She underwent TTE along with TEE both confirming severe combined systolic and diastolic CHF however no evidence of endocarditis.  She was stabilized and transferred to hospitalist service under my care on day 7 of her hospital stay date 01/21/2015.   Unfortunately her renal function has continued to decline, on 01/24/2015 it was decided that she would likely require permanent dialysis. IR has been consulted for a temporary dialysis catheter placement and vascular surgery for a permanent access.   Subjective:    Pamela [name] today has, No headache, No chest pain, No abdominal pain - No Nausea, No new weakness tingling or numbness, No Cough - SOB.      Assessment  & Plan :     1. Acute hypoxic respiratory failure due to combination of toxic encephalopathy failure to protect airway, fluid overload. Requiring intubation and extubation 2, finally extubated on 01/20/2015. Pulmonary status appears stable. No evidence of pneumonia. Mentation has improved. Try to titrate off oxygen, supportive care with Neb treatments as needed.  2. ARF on CK D stage III. Underlying problem thought to be hypertensive nephrosclerosis, renal on board, was dialyzed via right femoral Vas-Cath. Vas-Cath was removed a few days ago however her renal function has not improved as expected. On 01/24/2015 it was decided that she would likely require permanent dialysis. IR has been consulted for a temporary dialysis catheter placement and vascular surgery for a permanent access.  3. Acute on chronic combined diastolic and systolic heart failure with ischemic cardiomyopathy. EF 30%. Underwent TTE and TEE, cardiology on board, fluid removal via dialysis, continue Coreg along with BiDil. Has unstentable CAD. Have added low-dose aspirin as well.  4. Questionable sepsis. No clear source, all cultures negative, UA was clear, chest x-ray was clear, one of her sputum cultures grew Enterobacter cloacae, in ICU was tapered down to Rocephin which she finished on 01/21/2015. Will monitor temperature curve and CBC, stable repeat 2 view chest x-ray on 01/21/2015.  5. History of COPD. No wheezing, nebulizer treatments as needed, try to titrate off oxygen.  6. Toxic encephalopathy due to uremia. Resolved plan as in #1 above.  7. Dyslipidemia. Statin has been switched by cardiology.  8. Left  cephalic vein SVT noted on ultrasound done in ICU on 01/18/2015. Supportive care. Discussed with vascular surgeon on call Dr. Cherly Hensen. No further evaluation or treatment for SVT. No line in left arm.   9. DM type II. Stable A1c place on sliding scale for now. Avoid oral hypoglycemics for now. Low-dose  Lantus added with better control.  Lab Results  Component Value Date   HGBA1C 6.8* 01/21/2015    CBG (last 3)   Recent Labs  01/23/15 1615 01/23/15 2122 01/24/15 0545  GLUCAP 239* 200* 142*      Code Status : Partial  Family Communication  : Husband bedside  Disposition Plan  : Be decided  Consults  :  PCCM, renal, cardiology  Procedures  :   9/3 intubated and renal consult called. 9/5- neg 2.2 liters, cvvhd 9/6- increase urine output, ETT placed 9/7 doppler>>>neg 9/7 echo>>>25-30%, pa 40, thick aortic 9/8- extubated 9/9 tee>>>severely depressed, AV with very mild calcification. No obvious vegetation  9/9 - urine output increased 9/7 - Doppler obtained in ICU positive for left cephalic vein SVT.   DVT Prophylaxis  :  Heparin   Lab Results  Component Value Date   PLT 152 01/22/2015    Inpatient Medications  Scheduled Meds: . antiseptic oral rinse  7 mL Mouth Rinse BID  . aspirin  81 mg Oral Daily  . atorvastatin  10 mg Oral q1800  . carvedilol  9.375 mg Oral BID WC  . darbepoetin (ARANESP) injection - NON-DIALYSIS  100 mcg Subcutaneous Q Fri-1800  . dextromethorphan-guaiFENesin  1 tablet Oral BID  . furosemide  80 mg Intravenous BID  . heparin subcutaneous  5,000 Units Subcutaneous 3 times per day  . insulin aspart  0-5 Units Subcutaneous QHS  . insulin aspart  0-9 Units Subcutaneous TID WC  . insulin glargine  10 Units Subcutaneous Daily  . isosorbide-hydrALAZINE  1 tablet Oral TID  . latanoprost  1 drop Both Eyes QHS   Continuous Infusions:  PRN Meds:.acetaminophen (TYLENOL) oral liquid 160 mg/5 mL, acetaminophen, GLUCERNA, ipratropium-albuterol  Antibiotics  :     Anti-infectives    Start     Dose/Rate Route Frequency Ordered Stop   01/17/15 1000  cefTRIAXone (ROCEPHIN) 1 g in dextrose 5 % 50 mL IVPB     1 g 100 mL/hr over 30 Minutes Intravenous Every 24 hours 01/17/15 0901 01/21/15 1206   01/15/15 1600  vancomycin (VANCOCIN) IVPB 750  mg/150 ml premix  Status:  Discontinued     750 mg 150 mL/hr over 60 Minutes Intravenous Every 24 hours 01/15/15 0851 01/16/15 0909   01/15/15 0100  piperacillin-tazobactam (ZOSYN) IVPB 2.25 g  Status:  Discontinued     2.25 g 100 mL/hr over 30 Minutes Intravenous Every 8 hours 01/14/15 1614 01/14/15 2152   01/14/15 2200  piperacillin-tazobactam (ZOSYN) IVPB 2.25 g  Status:  Discontinued     2.25 g 100 mL/hr over 30 Minutes Intravenous 4 times per day 01/14/15 2152 01/17/15 0901   01/14/15 1600  vancomycin (VANCOCIN) IVPB 1000 mg/200 mL premix     1,000 mg 200 mL/hr over 60 Minutes Intravenous  Once 01/14/15 1506 01/14/15 1708   01/14/15 1600  piperacillin-tazobactam (ZOSYN) IVPB 2.25 g  Status:  Discontinued     2.25 g 100 mL/hr over 30 Minutes Intravenous  Once 01/14/15 1506 01/14/15 1512   01/14/15 1512  piperacillin-tazobactam (ZOSYN) IVPB 3.375 g     3.375 g 100 mL/hr over 30 Minutes Intravenous  Once 01/14/15 1512  01/14/15 1624        Objective:   Filed Vitals:   01/23/15 0900 01/23/15 1223 01/23/15 2011 01/24/15 0516  BP: 136/60 133/51 107/67 119/45  Pulse: 76 105 89 94  Temp: 98.6 F (37 C) 98.3 F (36.8 C) 98.6 F (37 C) 97.9 F (36.6 C)  TempSrc: Oral Oral Oral Oral  Resp: 20 18 18 18   Weight:    55.067 kg (121 lb 6.4 oz)  SpO2:  100% 100% 98%    Wt Readings from Last 3 Encounters:  01/24/15 55.067 kg (121 lb 6.4 oz)  01/12/15 50.803 kg (112 lb)  10/24/14 51.982 kg (114 lb 9.6 oz)     Intake/Output Summary (Last 24 hours) at 01/24/15 1103 Last data filed at 01/24/15 0909  Gross per 24 hour  Intake    720 ml  Output    500 ml  Net    220 ml     Physical Exam  Awake Alert, Oriented X 3, No new F.N deficits, Normal affect [place].AT,PERRAL Supple Neck,No JVD, No cervical lymphadenopathy appriciated.  Symmetrical Chest wall movement, Good air movement bilaterally, CTAB RRR,No Gallops,Rubs or new Murmurs, No Parasternal Heave +ve B.Sounds, Abd Soft, No  tenderness, No organomegaly appriciated, No rebound - guarding or rigidity. No Cyanosis, Clubbing or edema, No new Rash or bruise        Data Review:   Micro Results Recent Results (from the past 240 hour(s))  Culture, blood (routine x 2)     Status: None   Collection Time: 01/14/15  2:45 PM  Result Value Ref Range Status   Specimen Description BLOOD LEFT WRIST  Final   Special Requests BOTTLES DRAWN AEROBIC AND ANAEROBIC 5CC  Final   Culture NO GROWTH 5 DAYS  Final   Report Status 01/19/2015 FINAL  Final  Culture, blood (routine x 2)     Status: None   Collection Time: 01/14/15  2:55 PM  Result Value Ref Range Status   Specimen Description BLOOD RIGHT HAND  Final   Special Requests BOTTLES DRAWN AEROBIC AND ANAEROBIC  Final   Culture NO GROWTH 5 DAYS  Final   Report Status 01/19/2015 FINAL  Final  MRSA PCR Screening     Status: None   Collection Time: 01/14/15  6:00 PM  Result Value Ref Range Status   MRSA by PCR NEGATIVE NEGATIVE Final    Comment:        The GeneXpert MRSA Assay (FDA approved for NASAL specimens only), is one component of a comprehensive MRSA colonization surveillance program. It is not intended to diagnose MRSA infection nor to guide or monitor treatment for MRSA infections.   Culture, respiratory (NON-Expectorated)     Status: None   Collection Time: 01/14/15  6:03 PM  Result Value Ref Range Status   Specimen Description TRACHEAL ASPIRATE  Final   Special Requests NONE  Final   Gram Stain   Final    NO WBC SEEN NO SQUAMOUS EPITHELIAL CELLS SEEN NO ORGANISMS SEEN Performed at [facility]    Culture   Final    FEW ENTEROBACTER CLOACAE Performed at [facility]    Report Status 01/16/2015 FINAL  Final   Organism ID, Bacteria ENTEROBACTER CLOACAE  Final      Susceptibility   Enterobacter cloacae - MIC*    CEFAZOLIN >=64 RESISTANT Resistant     CEFEPIME <=1 SENSITIVE Sensitive     CEFTAZIDIME <=1 SENSITIVE Sensitive      CEFTRIAXONE <=1  SENSITIVE Sensitive     CIPROFLOXACIN <=0.25 SENSITIVE Sensitive     GENTAMICIN <=1 SENSITIVE Sensitive     IMIPENEM 0.5 SENSITIVE Sensitive     PIP/TAZO <=4 SENSITIVE Sensitive     TOBRAMYCIN <=1 SENSITIVE Sensitive     TRIMETH/SULFA <=20 SENSITIVE Sensitive     * FEW ENTEROBACTER CLOACAE  Urine culture     Status: None   Collection Time: 01/15/15 10:05 PM  Result Value Ref Range Status   Specimen Description URINE, CATHETERIZED  Final   Special Requests NONE  Final   Culture NO GROWTH 2 DAYS  Final   Report Status 01/17/2015 FINAL  Final  Culture, blood (routine x 2)     Status: None   Collection Time: 01/18/15  4:40 PM  Result Value Ref Range Status   Specimen Description BLOOD RIGHT ARM  Final   Special Requests BOTTLES DRAWN AEROBIC ONLY 5CC  Final   Culture NO GROWTH 5 DAYS  Final   Report Status 01/23/2015 FINAL  Final  Culture, blood (routine x 2)     Status: None   Collection Time: 01/18/15  4:45 PM  Result Value Ref Range Status   Specimen Description BLOOD LEFT ARM  Final   Special Requests BOTTLES DRAWN AEROBIC ONLY 5CC  Final   Culture NO GROWTH 5 DAYS  Final   Report Status 01/23/2015 FINAL  Final  Urine culture     Status: None   Collection Time: 01/21/15  9:00 AM  Result Value Ref Range Status   Specimen Description URINE, CLEAN CATCH  Final   Special Requests NONE  Final   Culture NO GROWTH 1 DAY  Final   Report Status 01/22/2015 FINAL  Final    Radiology Reports Dg Chest 2 View  01/21/2015   CLINICAL DATA:  Cough.  EXAM: CHEST  2 VIEW  COMPARISON:  January 19, 2015.  FINDINGS: The heart size and mediastinal contours are within normal limits. Both lungs are clear. No pneumothorax is noted. Minimal bilateral pleural effusions are noted. The visualized skeletal structures are unremarkable.  IMPRESSION: Minimal bilateral pleural effusions. No other significant abnormality seen in the chest.   Electronically Signed   By: Lupita Raider, M.D.    On: 01/21/2015 20:27   US Renal  01/14/2015   CLINICAL DATA:  Renal failure. History of hypertension, COPD, diabetes.  EXAM: RENAL / URINARY TRACT ULTRASOUND COMPLETE  COMPARISON:  10/21/2014  FINDINGS: Right Kidney:  Length: 9.9 cm. Renal parenchyma is echogenic. Multiple small cysts are identified. The largest is seen in the lower pole region measuring 1.1 cm. No hydronephrosis.  Left Kidney:  Length: 10.2 cm. Echogenic renal parenchyma. Multiple small cysts are identified, largest in the lower pole region which measures 1.3 cm.  Bladder:  A Foley catheter decompresses the bladder.  Additional:  Note is made of a small amount of ascites.  IMPRESSION: 1. Echogenic kidneys bilaterally.  Bilateral renal cysts. 2. No hydronephrosis. 3. Small amount of ascites noted.   Electronically Signed   By: Norva Pavlov M.D.   On: 01/14/2015 17:44   US Biopsy  01/12/2015   INDICATION: Proteinuria of uncertain etiology. Please perform ultrasound-guided biopsy for tissue diagnostic purposes.  EXAM: ULTRASOUND GUIDED RENAL BIOPSY  COMPARISON:  Renal ultrasound - 10/21/2014  MEDICATIONS: Fentanyl 50 mcg IV; Versed 1 mg IV  ANESTHESIA/SEDATION: Total Moderate Sedation time  15 minutes  COMPLICATIONS: SIR level A: No therapy, no consequence.  Procedure complicated by development of a tiny, asymptomatic,  non enlarging left-sided perinephric hematoma.  PROCEDURE: Informed written consent was obtained from the patient after a discussion of the risks, benefits and alternatives to treatment. The patient understands and consents the procedure. A timeout was performed prior to the initiation of the procedure.  Ultrasound scanning was performed of the bilateral flanks. The inferior pole of the left kidney was selected for biopsy due to location and sonographic window. The procedure was planned. The operative site was prepped and draped in the usual sterile fashion. The overlying soft tissues were anesthetized with 1% lidocaine with  epinephrine. A 17 gauge core needle biopsy device was advanced into the inferior cortex of the left kidney and 3 core biopsies were obtained under direct ultrasound guidance. Real time pathologic review confirmed adequate tissue acquisition. Images were saved for documentation purposes. The biopsy device was removed and hemostasis was obtained with manual compression.  Postprocedural scanning and demonstrated development of a very tiny perinephric hematoma which was noted to not enlarged on the acquired delayed postprocedural imaging (representative images 8 and 9). Post procedural scanning was negative for significant post procedural hemorrhage or additional complication. A dressing was placed. The patient tolerated the procedure well without immediate post procedural complication.  IMPRESSION: Technically successful ultrasound guided left renal biopsy.   Electronically Signed   By: Simonne Come M.D.   On: 01/12/2015 13:57   Dg Chest Port 1 View  01/23/2015   CLINICAL DATA:  Shortness of breath  EXAM: PORTABLE CHEST - 1 VIEW  COMPARISON:  January 21, 2015  FINDINGS: The heart size and mediastinal contours are within normal limits. There is no focal pneumonia or pulmonary edema. Minimal right pleural effusion is identified. The visualized skeletal structures are stable.  IMPRESSION: Minimal right pleural effusion.  No focal pneumonia.   Electronically Signed   By: Sherian Rein M.D.   On: 01/23/2015 09:40   Dg Chest Port 1 View  01/19/2015   CLINICAL DATA:  Renal failure.  EXAM: PORTABLE CHEST - 1 VIEW  COMPARISON:  01/17/2015.  FINDINGS: Endotracheal tube, left IJ line, NG tube in stable position. Heart size is stable. Bibasilar pulmonary infiltrates consistent pneumonia. Minimal interval improvement. No pleural effusion or pneumothorax.  IMPRESSION: 1. Lines and tubes in stable position. 2. Bibasilar infiltrates consistent with pneumonia. Minimal interval improvement.   Electronically Signed   By: Maisie Fus   Register   On: 01/19/2015 07:21   Dg Chest Port 1 View  01/17/2015   CLINICAL DATA:  Ventilator dependent respiratory failure. Followup left basilar atelectasis.  EXAM: PORTABLE CHEST - 1 VIEW  COMPARISON:  01/15/2015 and earlier.  FINDINGS: Endotracheal tube tip in satisfactory position approximately 4 cm above the carina. Nasogastric tube courses below the diaphragm into the stomach though its tip is not included on the image. Left jugular central venous catheter tip projects over the mid to lower SVC.  Cardiac silhouette normal in size, unchanged. Airspace opacities involving the lung bases bilaterally, right greater than left, progressive since the examination 2 days ago. Pulmonary vascularity normal. Possible small bilateral pleural effusions.  IMPRESSION: 1. Support apparatus satisfactory. 2. Pneumonia involving the lung bases, right greater than left, progressive since the examination 2 days ago. 3. Possible small bilateral pleural effusions.   Electronically Signed   By: Hulan Saas M.D.   On: 01/17/2015 11:28   Dg Chest Port 1 View  01/15/2015   CLINICAL DATA:  Septic shock, respiratory failure and acute renal failure. Question left lower lobe pneumonia.  EXAM: PORTABLE CHEST -  1 VIEW  COMPARISON:  01/14/2015  FINDINGS: Endotracheal tube with tip 3.2 cm above the carina, left IJ central venous catheter with tip overlying the superior cavoatrial junction NG tube entering the stomach with tip off the field of view noted.  Left lower lung opacity has improved.  There is no evidence of pleural effusion or pneumothorax.  IMPRESSION: Improved left lower lung opacity, suggesting atelectasis.  No other significant change.   Electronically Signed   By: Harmon Pier M.D.   On: 01/15/2015 07:32   Dg Chest Port 1 View  01/14/2015   CLINICAL DATA:  Septic shock, respiratory failure and acute renal failure. Endotracheal tube and central line placement.  EXAM: PORTABLE CHEST - 1 VIEW  COMPARISON:  01/14/2015 and  prior radiographs  FINDINGS: The cardiomediastinal silhouette is unchanged.  An endotracheal tube with tip 5.1 cm above the carina, left IJ central venous catheter with tip overlying the lower SVC and NG tube entering the stomach with tip off the field of view are noted.  Left lower lung airspace disease is present.  There is no evidence of pneumothorax or pleural effusion.  IMPRESSION: Left lower lung airspace disease/ pneumonia.  Support apparatus as described.  No evidence of pneumothorax.   Electronically Signed   By: Harmon Pier M.D.   On: 01/14/2015 16:38   Dg Chest Portable 1 View  01/14/2015   CLINICAL DATA:  Sepsis.  EXAM: PORTABLE CHEST - 1 VIEW  COMPARISON:  03/25/2014.  FINDINGS: Interval borderline enlargement of the cardiac silhouette and mild increase in prominence of the interstitial markings, accentuated by a decreased inspiration. Calcified granuloma in the right lower lung zone. No pleural fluid. Diffuse osteopenia.  IMPRESSION: Interval borderline cardiomegaly and mildly increased prominence of the interstitial markings, most likely due to a poor inspiration. No gross acute abnormality.   Electronically Signed   By: Beckie Salts M.D.   On: 01/14/2015 15:21   Dg Abd Portable 1v  01/17/2015   CLINICAL DATA:  Nasogastric tube placement  EXAM: PORTABLE ABDOMEN - 1 VIEW  COMPARISON:  None.  FINDINGS: Orogastric tube with the tip projecting over the antrum of the stomach. There is no bowel dilatation to suggest obstruction. There is no evidence of pneumoperitoneum, portal venous gas or pneumatosis. There are no pathologic calcifications along the expected course of the ureters.The osseous structures are unremarkable.  IMPRESSION: Orogastric tube with the tip projecting over the antrum of the stomach.   Electronically Signed   By: Elige Ko   On: 01/17/2015 11:19     CBC  Recent Labs Lab 01/18/15 0501 01/19/15 0404 01/22/15 0500  WBC 9.1 12.1* 9.0  HGB 8.6* 8.5* 7.8*  HCT 26.6* 26.1*  25.0*  PLT 103* 130* 152  MCV 91.1 89.1 94.7  MCH 29.5 29.0 29.5  MCHC 32.3 32.6 31.2  RDW 16.0* 15.9* 16.7*  LYMPHSABS 0.4* 0.6*  --   MONOABS 0.3 0.7  --   EOSABS 0.0 0.0  --   BASOSABS 0.0 0.0  --     Chemistries   Recent Labs Lab 01/18/15 0501  01/20/15 0548 01/21/15 0347 01/22/15 0500 01/23/15 0301 01/24/15 0302  NA 134*  < > 136 128* 135 130* 123*  K 3.6  < > 3.3* 3.6 4.2 4.1 4.4  CL 93*  < > 95* 92* 98* 95* 88*  CO2 27  < > GLUCOSE 206*  < > 173* 168* 175* 125* 143*  BUN 15  < >  55* 56* 24* 31* 36*  CREATININE 1.54*  < > 3.76* 4.46* 3.21* 4.11* 5.06*  CALCIUM 7.8*  < > 8.3* 7.9* 8.0* 7.9* 7.6*  MG 2.3  < > 2.3 2.2 2.0 2.2 2.1  AST 26  --   --   --   --   --   --   ALT 19  --   --   --   --   --   --   ALKPHOS 45  --   --   --   --   --   --   BILITOT 1.2  --   --   --   --   --   --   < > = values in this interval not displayed. ------------------------------------------------------------------------------------------------------------------ estimated creatinine clearance is 8.5 mL/min (by C-G formula based on Cr of 5.06). ------------------------------------------------------------------------------------------------------------------  Recent Labs  01/21/15 1306  HGBA1C 6.8*   ------------------------------------------------------------------------------------------------------------------ No results for input(s): CHOL, HDL, LDLCALC, TRIG, CHOLHDL, LDLDIRECT in the last 72 hours. ------------------------------------------------------------------------------------------------------------------ No results for input(s): TSH, T4TOTAL, T3FREE, THYROIDAB in the last 72 hours.  Invalid input(s): FREET3 ------------------------------------------------------------------------------------------------------------------ No results for input(s): VITAMINB12, FOLATE, FERRITIN, TIBC, IRON, RETICCTPCT in the last 72 hours.  Coagulation profile No results  for input(s): INR, PROTIME in the last 168 hours.  No results for input(s): DDIMER in the last 72 hours.  Cardiac Enzymes No results for input(s): CKMB, TROPONINI, MYOGLOBIN in the last 168 hours.  Invalid input(s): CK ------------------------------------------------------------------------------------------------------------------ Invalid input(s): POCBNP   Time Spent in minutes   35   SINGH,PRASHANT K M.D on 01/24/2015 at 11:03 AM  Between 7am to 7pm - Pager - [phone]  After 7pm go to [email or web address] - password [facility]  [facility] -  Office  [phone]

## 2015-01-24 NOTE — Progress Notes (Signed)
Physical Therapy Treatment Patient Details Name: Pamela [name] MRN: [id number] DOB: 1950-11-11 Today's Date: 01/24/2015    History of Present Illness Pt is a 64 y/o female with a PMH of stage III kidney disease who had a biopsy done 9/1 of L kidney. For 24 hours PTA pt continued to deteriorate from a mental status standpoint and had a LOC on 9/3 and was brought to the ED. In the ED pt was noted to be in renal failure, was hyperkalemic, acidotic, and in respiratory failure due to AMS. She was intubated 9/3-9/4, and reintubated 9/6, extubated on 9/8.    PT Comments    Pt with flat affect but continued progression with mobility and decreased need for supplemental oxygen. Pt educated for HEP and encouraged to continue to perform as well as increase gait with trials to bathroom with nursing. Will follow  Follow Up Recommendations  Home health PT;Supervision for mobility/OOB     Equipment Recommendations       Recommendations for Other Services       Precautions / Restrictions Precautions Precautions: Fall    Mobility  Bed Mobility               General bed mobility comments: pt received in chair  Transfers Overall transfer level: Needs assistance     Sit to Stand: Supervision         General transfer comment: cues for hand placement  Ambulation/Gait Ambulation/Gait assistance: Supervision Ambulation Distance (Feet): 140 Feet Assistive device: Rolling walker (2 wheeled) Gait Pattern/deviations: Step-through pattern;Decreased stride length   Gait velocity interpretation: Below normal speed for age/gender General Gait Details: cues for posture, position in RW . Pt maintaining sats 95-98% on .5L with gait, HR max 122   Stairs Stairs: Yes Stairs assistance: Min assist Stair Management: One rail Right;Step to pattern;Forwards Number of Stairs: 3 General stair comments: pt required min assist and rail to safely ascend steps with cues and assist, pt unable to use  therapist shoulders to step up backward and states spouse can assist and build rail at home. Spouse confirms he can build rail or pull around to back of house where there is a rail on steps  Wheelchair Mobility    Modified Rankin (Stroke Patients Only)       Balance Overall balance assessment: Needs assistance   Sitting balance-Leahy Scale: Good       Standing balance-Leahy Scale: Fair                      Cognition Arousal/Alertness: Awake/alert Behavior During Therapy: Flat affect Overall Cognitive Status: Within Functional Limits for tasks assessed                      Exercises General Exercises - Lower Extremity Long Arc Quad: Both;20 reps;AROM;Seated Hip ABduction/ADduction: AROM;Seated;Both;20 reps Hip Flexion/Marching: AROM;Seated;Both;20 reps    General Comments        Pertinent Vitals/Pain Pain Assessment: No/denies pain  HR 104-122 with gait sats 95-99% on 1L    Home Living                      Prior Function            PT Goals (current goals can now be found in the care plan section) Progress towards PT goals: Progressing toward goals    Frequency       PT Plan Current plan remains appropriate    Co-evaluation  End of Session Equipment Utilized During Treatment: Oxygen Activity Tolerance: Patient tolerated treatment well Patient left: in chair;with call bell/phone within reach;with family/visitor present;with nursing/sitter in room     Time: 1012-1032 PT Time Calculation (min) (ACUTE ONLY): 20 min  Charges:  $Gait Training: 8-22 mins                    G Codes:      Delorse Lek 02-21-2015, 12:51 PM [name], PT [phone]

## 2015-01-24 NOTE — Progress Notes (Signed)
Results for DECLYN, JARDON (MRN [id number]) as of 01/24/2015 12:17  Ref. Range 01/23/2015 11:05 01/23/2015 16:15 01/23/2015 21:22 01/24/2015 05:45 01/24/2015 11:09  Glucose-Capillary Latest Ref Range: 65-99 mg/dL 734 (H) 287 (H) 681 (H) 142 (H) 256 (H)  Postprandial blood sugars continue to be greater than 180 mg/dl. Recommend adding Novolog 3 units TID with meals if eating at least 50% of meal. Continue current Lantus 10 units every HS and Novolog SENSITIVE correction scale TID & HS. Will continue to follow while in hospital. Smith Mince RN BSN CDE

## 2015-01-24 NOTE — H&P (Signed)
Chief Complaint: Patient was seen in consultation today for acute on chronic kidney disease Chief Complaint  Patient presents with  . Fatigue   at the request of Dr. Allena Katz  Referring Physician(s): Dr. Allena Katz- Nephrology   History of Present Illness: Pamela [name] is a 64 y.o. female with history of chronic kidney disease s/p renal biopsy 9/1. She presented back 9/3 with fatigue and AMS changes. Follow-up renal US without hematoma. Patient treated empirically for sepsis, Blood Cx no growth. Nephrology has been following the patient in the hospital and now with acute on chronic kidney disease and request for perm catheter. She denies any chest pain, shortness of breath or palpitations. She denies any active signs of bleeding or excessive bruising. She denies any recent fever or chills. The patient denies any history of sleep apnea or chronic oxygen use. She has previously tolerated sedation without complications.    Past Medical History  Diagnosis Date  . PONV (postoperative nausea and vomiting)   . Hypertension   . COPD (chronic obstructive pulmonary disease)   . Diabetes mellitus without complication 10-29-12  . Coronary artery disease   . Shortness of breath dyspnea   . Anxiety   . Chronic kidney disease     Past Surgical History  Procedure Laterality Date  . Abdominal hysterectomy    . Elbow surgery Right     tendon surgery  . Tubal ligation    . Breast surgery      cyst removed  . Cholecystectomy      '74-open  . Appendectomy      '74- open with gallbladder  . Cardiac catheterization      1 coronary stent placed  . Ganglion cyst excision Bilateral 10-29-12    wrist  . Anal fissure repair    . Colonoscopy with propofol N/A 11/17/2012    Procedure: COLONOSCOPY WITH PROPOFOL;  Surgeon: Charolett Bumpers, MD;  Location: [facility] ENDOSCOPY;  Service: Endoscopy;  Laterality: N/A;  . Esophagogastroduodenoscopy (egd) with propofol N/A 11/17/2012    Procedure:  ESOPHAGOGASTRODUODENOSCOPY (EGD) WITH PROPOFOL;  Surgeon: Charolett Bumpers, MD;  Location: [facility] ENDOSCOPY;  Service: Endoscopy;  Laterality: N/A;  . Left heart catheterization with coronary angiogram N/A 04/14/2014    Procedure: LEFT HEART CATHETERIZATION WITH CORONARY ANGIOGRAM;  Surgeon: Lesleigh Noe, MD;  Location: [facility] CATH LAB;  Service: Cardiovascular;  Laterality: N/A;    Allergies: Review of patient's allergies indicates no known allergies.  Medications: Prior to Admission medications   Medication Sig Start Date End Date Taking? Authorizing Provider  ALPRAZolam Prudy Feeler) 0.5 MG tablet Take 0.5 mg by mouth 2 (two) times daily.   Yes Historical Provider, MD  amLODipine (NORVASC) 10 MG tablet Take 10 mg by mouth daily. 09/02/14  Yes Historical Provider, MD  amoxicillin-clavulanate (AUGMENTIN) 500-125 MG per tablet Take 1 tablet by mouth 2 (two) times daily. 01/13/15  Yes Historical Provider, MD  aspirin EC 81 MG tablet Take 81 mg by mouth daily.   Yes Historical Provider, MD  atorvastatin (LIPITOR) 10 MG tablet Take 10 mg by mouth every morning.   Yes Historical Provider, MD  carvedilol (COREG) 25 MG tablet Take 1 tablet (25 mg total) by mouth 3 (three) times daily. 04/20/14  Yes Lyn Records, MD  furosemide (LASIX) 40 MG tablet Take 1 tablet by mouth  daily Patient taking differently: Take 2 tablet by mouth  daily 07/19/14  Yes Lyn Records, MD  glimepiride (AMARYL) 2 MG tablet Take  2 mg by mouth daily. 09/02/14  Yes Historical Provider, MD  iron polysaccharides (NIFEREX) 150 MG capsule Take 1 capsule (150 mg total) by mouth daily. 03/28/14  Yes Rhetta Mura, MD  isosorbide dinitrate (ISORDIL) 20 MG tablet Take 1 tablet (20 mg total) by mouth 3 (three) times daily. 07/29/14  Yes Lyn Records, MD  latanoprost (XALATAN) 0.005 % ophthalmic solution Place 1 drop into both eyes at bedtime.   Yes Historical Provider, MD  metFORMIN (GLUCOPHAGE) 500 MG tablet Take 500-1,000 mg by mouth 3 (three)  times daily. Takes 1000 mg in the morning, 500 mg at lunch, and 1000 mg at night   Yes Historical Provider, MD  nitroGLYCERIN (NITROSTAT) 0.4 MG SL tablet Place 0.4 mg under the tongue every 5 (five) minutes as needed for chest pain.   Yes Historical Provider, MD  predniSONE (STERAPRED UNI-PAK 21 TAB) 10 MG (21) TBPK tablet Take 10-20 mg by mouth as directed. 6 day dose pack 01/13/15  Yes Historical Provider, MD  PROAIR HFA 108 (90 BASE) MCG/ACT inhaler Inhale 2 puffs into the lungs every 4 (four) hours as needed for wheezing or shortness of breath.  01/13/15  Yes Historical Provider, MD  salmeterol (SEREVENT) 50 MCG/DOSE diskus inhaler Inhale 1 puff into the lungs 2 (two) times daily.   Yes Historical Provider, MD     Family History  Problem Relation Age of Onset  . Diabetes Father   . Cancer Father   . Heart attack Mother   . Hypertension Mother   . Hypertension Father   . Diabetes Sister   . Hypertension Sister   . Cancer Sister   . Hypertension Sister     Social History   Social History  . Marital Status: Married    Spouse Name: N/A  . Number of Children: N/A  . Years of Education: N/A   Social History Main Topics  . Smoking status: Former Smoker -- 1.50 packs/day    Types: Cigarettes    Quit date: 10/29/1992  . Smokeless tobacco: None  . Alcohol Use: No  . Drug Use: No  . Sexual Activity: Not Currently   Other Topics Concern  . None   Social History Narrative   Review of Systems: A 12 point ROS discussed and pertinent positives are indicated in the HPI above.  All other systems are negative.  Review of Systems  Vital Signs: BP 119/45 mmHg  Pulse 86  Temp(Src) 98.7 F (37.1 C) (Oral)  Resp 18  Wt 121 lb 6.4 oz (55.067 kg)  SpO2 100%  Physical Exam  Constitutional: She is oriented to person, place, and time. No distress.  HENT:  Head: Normocephalic and atraumatic.  Neck: No tracheal deviation present.  Cardiovascular: Normal rate and regular rhythm.  Exam  reveals no gallop and no friction rub.   No murmur heard. Pulmonary/Chest: Effort normal and breath sounds normal. No respiratory distress. She has no wheezes. She has no rales.  Abdominal: Soft. Bowel sounds are normal. She exhibits no distension. There is no tenderness.  Neurological: She is alert and oriented to person, place, and time.  Skin: Skin is warm and dry. She is not diaphoretic.    Mallampati Score:  MD Evaluation Airway: WNL Heart: WNL Abdomen: WNL Chest/ Lungs: WNL ASA  Classification: 3 Mallampati/Airway Score: Two  Imaging: Dg Chest 2 View  01/21/2015   CLINICAL DATA:  Cough.  EXAM: CHEST  2 VIEW  COMPARISON:  January 19, 2015.  FINDINGS: The heart size and  mediastinal contours are within normal limits. Both lungs are clear. No pneumothorax is noted. Minimal bilateral pleural effusions are noted. The visualized skeletal structures are unremarkable.  IMPRESSION: Minimal bilateral pleural effusions. No other significant abnormality seen in the chest.   Electronically Signed   By: Lupita Raider, M.D.   On: 01/21/2015 20:27   US Renal  01/14/2015   CLINICAL DATA:  Renal failure. History of hypertension, COPD, diabetes.  EXAM: RENAL / URINARY TRACT ULTRASOUND COMPLETE  COMPARISON:  10/21/2014  FINDINGS: Right Kidney:  Length: 9.9 cm. Renal parenchyma is echogenic. Multiple small cysts are identified. The largest is seen in the lower pole region measuring 1.1 cm. No hydronephrosis.  Left Kidney:  Length: 10.2 cm. Echogenic renal parenchyma. Multiple small cysts are identified, largest in the lower pole region which measures 1.3 cm.  Bladder:  A Foley catheter decompresses the bladder.  Additional:  Note is made of a small amount of ascites.  IMPRESSION: 1. Echogenic kidneys bilaterally.  Bilateral renal cysts. 2. No hydronephrosis. 3. Small amount of ascites noted.   Electronically Signed   By: Norva Pavlov M.D.   On: 01/14/2015 17:44   US Biopsy  01/12/2015   INDICATION:  Proteinuria of uncertain etiology. Please perform ultrasound-guided biopsy for tissue diagnostic purposes.  EXAM: ULTRASOUND GUIDED RENAL BIOPSY  COMPARISON:  Renal ultrasound - 10/21/2014  MEDICATIONS: Fentanyl 50 mcg IV; Versed 1 mg IV  ANESTHESIA/SEDATION: Total Moderate Sedation time  15 minutes  COMPLICATIONS: SIR level A: No therapy, no consequence.  Procedure complicated by development of a tiny, asymptomatic, non enlarging left-sided perinephric hematoma.  PROCEDURE: Informed written consent was obtained from the patient after a discussion of the risks, benefits and alternatives to treatment. The patient understands and consents the procedure. A timeout was performed prior to the initiation of the procedure.  Ultrasound scanning was performed of the bilateral flanks. The inferior pole of the left kidney was selected for biopsy due to location and sonographic window. The procedure was planned. The operative site was prepped and draped in the usual sterile fashion. The overlying soft tissues were anesthetized with 1% lidocaine with epinephrine. A 17 gauge core needle biopsy device was advanced into the inferior cortex of the left kidney and 3 core biopsies were obtained under direct ultrasound guidance. Real time pathologic review confirmed adequate tissue acquisition. Images were saved for documentation purposes. The biopsy device was removed and hemostasis was obtained with manual compression.  Postprocedural scanning and demonstrated development of a very tiny perinephric hematoma which was noted to not enlarged on the acquired delayed postprocedural imaging (representative images 8 and 9). Post procedural scanning was negative for significant post procedural hemorrhage or additional complication. A dressing was placed. The patient tolerated the procedure well without immediate post procedural complication.  IMPRESSION: Technically successful ultrasound guided left renal biopsy.   Electronically Signed    By: Simonne Come M.D.   On: 01/12/2015 13:57   Dg Chest Port 1 View  01/23/2015   CLINICAL DATA:  Shortness of breath  EXAM: PORTABLE CHEST - 1 VIEW  COMPARISON:  January 21, 2015  FINDINGS: The heart size and mediastinal contours are within normal limits. There is no focal pneumonia or pulmonary edema. Minimal right pleural effusion is identified. The visualized skeletal structures are stable.  IMPRESSION: Minimal right pleural effusion.  No focal pneumonia.   Electronically Signed   By: Sherian Rein M.D.   On: 01/23/2015 09:40   Dg Chest [facility]  01/19/2015   CLINICAL DATA:  Renal failure.  EXAM: PORTABLE CHEST - 1 VIEW  COMPARISON:  01/17/2015.  FINDINGS: Endotracheal tube, left IJ line, NG tube in stable position. Heart size is stable. Bibasilar pulmonary infiltrates consistent pneumonia. Minimal interval improvement. No pleural effusion or pneumothorax.  IMPRESSION: 1. Lines and tubes in stable position. 2. Bibasilar infiltrates consistent with pneumonia. Minimal interval improvement.   Electronically Signed   By: Maisie Fus  Register   On: 01/19/2015 07:21   Dg Chest Port 1 View  01/17/2015   CLINICAL DATA:  Ventilator dependent respiratory failure. Followup left basilar atelectasis.  EXAM: PORTABLE CHEST - 1 VIEW  COMPARISON:  01/15/2015 and earlier.  FINDINGS: Endotracheal tube tip in satisfactory position approximately 4 cm above the carina. Nasogastric tube courses below the diaphragm into the stomach though its tip is not included on the image. Left jugular central venous catheter tip projects over the mid to lower SVC.  Cardiac silhouette normal in size, unchanged. Airspace opacities involving the lung bases bilaterally, right greater than left, progressive since the examination 2 days ago. Pulmonary vascularity normal. Possible small bilateral pleural effusions.  IMPRESSION: 1. Support apparatus satisfactory. 2. Pneumonia involving the lung bases, right greater than left, progressive since the  examination 2 days ago. 3. Possible small bilateral pleural effusions.   Electronically Signed   By: Hulan Saas M.D.   On: 01/17/2015 11:28   Dg Chest Port 1 View  01/15/2015   CLINICAL DATA:  Septic shock, respiratory failure and acute renal failure. Question left lower lobe pneumonia.  EXAM: PORTABLE CHEST - 1 VIEW  COMPARISON:  01/14/2015  FINDINGS: Endotracheal tube with tip 3.2 cm above the carina, left IJ central venous catheter with tip overlying the superior cavoatrial junction NG tube entering the stomach with tip off the field of view noted.  Left lower lung opacity has improved.  There is no evidence of pleural effusion or pneumothorax.  IMPRESSION: Improved left lower lung opacity, suggesting atelectasis.  No other significant change.   Electronically Signed   By: Harmon Pier M.D.   On: 01/15/2015 07:32   Dg Chest Port 1 View  01/14/2015   CLINICAL DATA:  Septic shock, respiratory failure and acute renal failure. Endotracheal tube and central line placement.  EXAM: PORTABLE CHEST - 1 VIEW  COMPARISON:  01/14/2015 and prior radiographs  FINDINGS: The cardiomediastinal silhouette is unchanged.  An endotracheal tube with tip 5.1 cm above the carina, left IJ central venous catheter with tip overlying the lower SVC and NG tube entering the stomach with tip off the field of view are noted.  Left lower lung airspace disease is present.  There is no evidence of pneumothorax or pleural effusion.  IMPRESSION: Left lower lung airspace disease/ pneumonia.  Support apparatus as described.  No evidence of pneumothorax.   Electronically Signed   By: Harmon Pier M.D.   On: 01/14/2015 16:38   Dg Chest Portable 1 View  01/14/2015   CLINICAL DATA:  Sepsis.  EXAM: PORTABLE CHEST - 1 VIEW  COMPARISON:  03/25/2014.  FINDINGS: Interval borderline enlargement of the cardiac silhouette and mild increase in prominence of the interstitial markings, accentuated by a decreased inspiration. Calcified granuloma in the  right lower lung zone. No pleural fluid. Diffuse osteopenia.  IMPRESSION: Interval borderline cardiomegaly and mildly increased prominence of the interstitial markings, most likely due to a poor inspiration. No gross acute abnormality.   Electronically Signed   By: Beckie Salts M.D.   On:  01/14/2015 15:21   Dg Abd Portable 1v  01/17/2015   CLINICAL DATA:  Nasogastric tube placement  EXAM: PORTABLE ABDOMEN - 1 VIEW  COMPARISON:  None.  FINDINGS: Orogastric tube with the tip projecting over the antrum of the stomach. There is no bowel dilatation to suggest obstruction. There is no evidence of pneumoperitoneum, portal venous gas or pneumatosis. There are no pathologic calcifications along the expected course of the ureters.The osseous structures are unremarkable.  IMPRESSION: Orogastric tube with the tip projecting over the antrum of the stomach.   Electronically Signed   By: Elige Ko   On: 01/17/2015 11:19    Labs:  CBC:  Recent Labs  01/17/15 0418 01/18/15 0501 01/19/15 0404 01/22/15 0500  WBC 10.7* 9.1 12.1* 9.0  HGB 8.6* 8.6* 8.5* 7.8*  HCT 26.7* 26.6* 26.1* 25.0*  PLT 103* 103* 130* 152    COAGS:  Recent Labs  04/14/14 0538 01/12/15 0854 01/14/15 1806 01/17/15 0418  INR 0.98 1.10 1.92* 1.23  APTT  --  30 35  --     BMP:  Recent Labs  01/21/15 0347 01/22/15 0500 01/23/15 0301 01/24/15 0302  NA 128* 135 130* 123*  K 3.6 4.2 4.1 4.4  CL 92* 98* 95* 88*  CO2 28 26 28 26   GLUCOSE 168* 175* 125* 143*  BUN 56* 24* 31* 36*  CALCIUM 7.9* 8.0* 7.9* 7.6*  CREATININE 4.46* 3.21* 4.11* 5.06*  GFRNONAA 10* 14* 11* 8*  GFRAA 11* 16* 12* 10*    LIVER FUNCTION TESTS:  Recent Labs  03/25/14 1430  01/14/15 1445 01/14/15 1806  01/18/15 0501  01/21/15 0347 01/22/15 0500 01/23/15 0301 01/24/15 0302  BILITOT 0.3  --  0.8 1.0  --  1.2  --   --   --   --   --   AST 20  --  38 56*  --  26  --   --   --   --   --   ALT 13  --  18 27  --  19  --   --   --   --   --     ALKPHOS 46  --  43 42  --  45  --   --   --   --   --   PROT 6.8  --  6.2* 5.7*  --  5.7*  --   --   --   --   --   ALBUMIN 3.9  < > 3.8 3.3*  < > 3.3*  < > 2.7* 2.9* 2.5* 2.4*  < > = values in this interval not displayed.   Assessment and Plan: Fatigue AMS- resolved  Possible sepsis s/p antibiotic therapy, Blood Cx no growth, afebrile S/p renal biopsy 9/1 by IR, F/U renal US 9/3 negative for hematoma Acute on chronic kidney disease Request for image guided perm catheter for HD- Nephrology following The patient will be NPO after midnight, sq heparin to be held 9/14, labs and vitals have been reviewed, afebrile. Risks and Benefits discussed with the patient including, but not limited to bleeding, infection, vascular injury, pneumothorax which may require chest tube placement, air embolism or even death All of the patient's questions were answered, patient is agreeable to proceed. Consent signed and in chart. 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  I greatly enjoyed meeting NAKAI YARD and look forward to participating in their care.  A  copy of this report was sent to the requesting provider on this date.  SignedBerneta Levins 01/24/2015, 12:03 PM   I spent a total of 20 Minutes in face to face in clinical consultation, greater than 50% of which was counseling/coordinating care for acute on chronic kidney disease.

## 2015-01-24 NOTE — Progress Notes (Signed)
Patient ID: Pamela [name], female   DOB: [birth year], 64 y.o.   MRN: [id number]  [facility] Progress Note   Assessment/ Plan:   1. Acute on chronic renal failure versus rapidly advancing chronic kidney disease- recently done renal biopsy shows severe arteriosclerosis/tubulointerstitial fibrosis indicating both the extent and chronicity of renal injury. Unfortunately, renal function continues to worsen daily with marginal urinary output in response to furosemide and worsening electrolyte status-hyponatremia. I have initiated discussions with the patient and her family regarding chronic hemodialysis and will consult interventional radiology for placement of a tunneled dialysis catheter. We'll then consult vascular surgery for placement of permanent dialysis access. 2. Sepsis- culture data negative to date-completed empiric antibiotic course for management of sepsis 3. Anemia- low but stable-ongoing treatment for anemia of chronic kidney disease-ESA . No overt loss following biopsy.    4. Secondary hyperparathyroidism- last PTH 102- no vit D- phos 3.7 5. HTN/volume- some volume overload- pressure is better- on norvasc and coreg- also determined to have poor EF 6. Hyponatremia: Due to free water excretion defect with renal failure-start fluid restriction of less than 1200 mL per day with ongoing loop diuretic.  Subjective:   She reports good urine output overnight (only charted at 500 mL) and still complains of some shortness of breath and easy fatigability. Reports that she is drinking a lot of Sprite and ice tea.    Objective:   BP 119/45 mmHg  Pulse 94  Temp(Src) 97.9 F (36.6 C) (Oral)  Resp 18  Wt 55.067 kg (121 lb 6.4 oz)  SpO2 98%  Intake/Output Summary (Last 24 hours) at 01/24/15 0946 Last data filed at 01/24/15 7510  Gross per 24 hour  Intake    720 ml  Output    500 ml  Net    220 ml   Weight change: -0.544 kg (-1 lb 3.2 oz)  Physical Exam: CHE:NIDPOEU to  be comfortable resting in her recliner, family members at bedside  MPN:TIRWE regular in rate and rhythm, S1 and S2 normal  Resp:Fine rales left base otherwise clear, no rhonchi/wheeze  RXV:QMGQ, flat, nontender Ext: No LE edema  Imaging: Dg Chest Port 1 View  01/23/2015   CLINICAL DATA:  Shortness of breath  EXAM: PORTABLE CHEST - 1 VIEW  COMPARISON:  January 21, 2015  FINDINGS: The heart size and mediastinal contours are within normal limits. There is no focal pneumonia or pulmonary edema. Minimal right pleural effusion is identified. The visualized skeletal structures are stable.  IMPRESSION: Minimal right pleural effusion.  No focal pneumonia.   Electronically Signed   By: Sherian Rein M.D.   On: 01/23/2015 09:40    Labs: BMET  Recent Labs Lab 01/18/15 0501  01/19/15 0403 01/19/15 0917 01/19/15 2207 01/20/15 0548 01/21/15 0347 01/22/15 0500 01/23/15 0301 01/24/15 0302  NA 134*  --  133*  --   --  136 128* 135 130* 123*  K 3.6  --  3.0*  --   --  3.3* 3.6 4.2 4.1 4.4  CL 93*  --  93*  --   --  95* 92* 98* 95* 88*  CO2 27  --  28  --   --  29 28 26 28 26   GLUCOSE 206*  --  266*  --   --  173* 168* 175* 125* 143*  BUN 15  --  39*  --   --  55* 56* 24* 31* 36*  CREATININE 1.54*  --  2.48*  --   --  3.76* 4.46* 3.21* 4.11* 5.06*  CALCIUM 7.8*  --  8.2*  --   --  8.3* 7.9* 8.0* 7.9* 7.6*  PHOS  --   < > 3.5 3.7 3.2 3.6 3.7 3.4 4.3 4.9*  < > = values in this interval not displayed. CBC  Recent Labs Lab 01/18/15 0501 01/19/15 0404 01/22/15 0500  WBC 9.1 12.1* 9.0  NEUTROABS 8.4* 10.8*  --   HGB 8.6* 8.5* 7.8*  HCT 26.6* 26.1* 25.0*  MCV 91.1 89.1 94.7  PLT 103* 130* 152   Medications:    . antiseptic oral rinse  7 mL Mouth Rinse BID  . aspirin  81 mg Oral Daily  . atorvastatin  10 mg Oral q1800  . carvedilol  9.375 mg Oral BID WC  . darbepoetin (ARANESP) injection - NON-DIALYSIS  100 mcg Subcutaneous Q Fri-1800  . dextromethorphan-guaiFENesin  1 tablet Oral BID   . furosemide  80 mg Intravenous BID  . heparin subcutaneous  5,000 Units Subcutaneous 3 times per day  . insulin aspart  0-5 Units Subcutaneous QHS  . insulin aspart  0-9 Units Subcutaneous TID WC  . insulin glargine  10 Units Subcutaneous Daily  . isosorbide-hydrALAZINE  1 tablet Oral TID  . latanoprost  1 drop Both Eyes QHS   Zetta Bills, MD 01/24/2015, 9:46 AM

## 2015-01-25 ENCOUNTER — Encounter (HOSPITAL_COMMUNITY): Payer: Self-pay

## 2015-01-25 ENCOUNTER — Inpatient Hospital Stay (HOSPITAL_COMMUNITY): Payer: Medicare Other

## 2015-01-25 DIAGNOSIS — Z992 Dependence on renal dialysis: Secondary | ICD-10-CM

## 2015-01-25 DIAGNOSIS — E871 Hypo-osmolality and hyponatremia: Secondary | ICD-10-CM

## 2015-01-25 DIAGNOSIS — N186 End stage renal disease: Secondary | ICD-10-CM

## 2015-01-25 HISTORY — DX: End stage renal disease: N18.6

## 2015-01-25 LAB — RENAL FUNCTION PANEL
ALBUMIN: 2.5 g/dL — AB (ref 3.5–5.0)
Anion gap: 10 (ref 5–15)
BUN: 39 mg/dL — AB (ref 6–20)
CO2: 24 mmol/L (ref 22–32)
CREATININE: 5.54 mg/dL — AB (ref 0.44–1.00)
Calcium: 7.6 mg/dL — ABNORMAL LOW (ref 8.9–10.3)
Chloride: 84 mmol/L — ABNORMAL LOW (ref 101–111)
GFR, EST AFRICAN AMERICAN: 9 mL/min — AB (ref >60)
GFR, EST NON AFRICAN AMERICAN: 7 mL/min — AB (ref >60)
Glucose, Bld: 126 mg/dL — ABNORMAL HIGH (ref 65–99)
PHOSPHORUS: 5.3 mg/dL — AB (ref 2.5–4.6)
POTASSIUM: 4.3 mmol/L (ref 3.5–5.1)
Sodium: 118 mmol/L — CL (ref 135–145)

## 2015-01-25 LAB — GLUCOSE, CAPILLARY
GLUCOSE-CAPILLARY: 119 mg/dL — AB (ref 65–99)
GLUCOSE-CAPILLARY: 154 mg/dL — AB (ref 65–99)
Glucose-Capillary: 141 mg/dL — ABNORMAL HIGH (ref 65–99)
Glucose-Capillary: 229 mg/dL — ABNORMAL HIGH (ref 65–99)

## 2015-01-25 LAB — PROCALCITONIN: PROCALCITONIN: 0.29 ng/mL

## 2015-01-25 LAB — MAGNESIUM: MAGNESIUM: 2.1 mg/dL (ref 1.7–2.4)

## 2015-01-25 MED ORDER — CEFAZOLIN SODIUM 1-5 GM-% IV SOLN
INTRAVENOUS | Status: AC
  Filled 2015-01-25: qty 50

## 2015-01-25 MED ORDER — LIDOCAINE HCL 1 % IJ SOLN
INTRAMUSCULAR | Status: AC
  Filled 2015-01-25: qty 20

## 2015-01-25 MED ORDER — SODIUM CHLORIDE 0.9 % IV SOLN: 100.0000 mL | INTRAVENOUS | Status: DC | PRN

## 2015-01-25 MED ORDER — HEPARIN SODIUM (PORCINE) 1000 UNIT/ML IJ SOLN
INTRAMUSCULAR | Status: AC
  Filled 2015-01-25: qty 1

## 2015-01-25 MED ORDER — NEPRO/CARBSTEADY PO LIQD
237.0000 mL | ORAL | Status: DC
  Administered 2015-01-25 – 2015-01-31 (×7): 237 mL via ORAL

## 2015-01-25 MED ORDER — FENTANYL CITRATE (PF) 100 MCG/2ML IJ SOLN
INTRAMUSCULAR | Status: AC
  Filled 2015-01-25: qty 4

## 2015-01-25 MED ORDER — MIDAZOLAM HCL 2 MG/2ML IJ SOLN
INTRAMUSCULAR | Status: AC
  Filled 2015-01-25: qty 4

## 2015-01-25 MED ORDER — ALTEPLASE 2 MG IJ SOLR
2.0000 mg | Freq: Once | INTRAMUSCULAR | Status: DC | PRN
  Filled 2015-01-25: qty 2

## 2015-01-25 MED ORDER — LIDOCAINE-PRILOCAINE 2.5-2.5 % EX CREA
1.0000 "application " | TOPICAL_CREAM | CUTANEOUS | Status: DC | PRN
  Filled 2015-01-25: qty 5

## 2015-01-25 MED ORDER — PENTAFLUOROPROP-TETRAFLUOROETH EX AERO: 1.0000 "application " | INHALATION_SPRAY | CUTANEOUS | Status: DC | PRN

## 2015-01-25 MED ORDER — LIDOCAINE HCL (PF) 1 % IJ SOLN: 5.0000 mL | INTRAMUSCULAR | Status: DC | PRN

## 2015-01-25 MED ORDER — HEPARIN SODIUM (PORCINE) 1000 UNIT/ML DIALYSIS: 1000.0000 [IU] | INTRAMUSCULAR | Status: DC | PRN

## 2015-01-25 MED ORDER — CALCIUM ACETATE (PHOS BINDER) 667 MG PO CAPS
667.0000 mg | ORAL_CAPSULE | Freq: Three times a day (TID) | ORAL | Status: DC
  Administered 2015-01-25 – 2015-01-30 (×15): 667 mg via ORAL
  Filled 2015-01-25 (×14): qty 1

## 2015-01-25 MED ORDER — MIDAZOLAM HCL 2 MG/2ML IJ SOLN
INTRAMUSCULAR | Status: AC | PRN
  Administered 2015-01-25: 0.5 mg via INTRAVENOUS

## 2015-01-25 MED ORDER — FENTANYL CITRATE (PF) 100 MCG/2ML IJ SOLN
INTRAMUSCULAR | Status: AC | PRN
  Administered 2015-01-25: 25 ug via INTRAVENOUS

## 2015-01-25 MED ORDER — SODIUM CHLORIDE 0.9 % IV SOLN
INTRAVENOUS | Status: AC | PRN
  Administered 2015-01-25: 10 mL/h via INTRAVENOUS

## 2015-01-25 MED ORDER — HEPARIN SODIUM (PORCINE) 1000 UNIT/ML DIALYSIS: 40.0000 [IU]/kg | INTRAMUSCULAR | Status: DC | PRN

## 2015-01-25 MED ORDER — CEFAZOLIN SODIUM 1-5 GM-% IV SOLN
1.0000 g | Freq: Once | INTRAVENOUS | Status: AC
  Administered 2015-01-25: 1 g via INTRAVENOUS

## 2015-01-25 NOTE — Progress Notes (Signed)
PROGRESS NOTE  SATORI KRABILL ZOX:[id number] DOB: [birth year] DOA: 01/14/2015 PCP: Georgann Housekeeper, MD  Brief History pleasant 64 year old Caucasian female with history of hypertensive nephrosclerosis with CKD stage IV under the care of Dr. Kathrene Bongo, CAD with chronic combined diastolic and systolic heart failure EF around 30%, mild AS, essential hypertension, dyslipidemia who underwent an outpatient kidney biopsy on 01/12/2015 thereafter presented to the ER on 01/14/2015 for decreased mentation due to uremia. She was found to be in acute renal failure with hyperkalemia, developed respiratory failure due to combination of fluid overload along with failure to protect airway due to uremia. She was intubated and kept in ICU. She was seen by cardiology and nephrology. Right femoral Vas-Cath was placed and she was started on dialysis this admission. She underwent TTE along with TEE both confirming severe combined systolic and diastolic CHF however no evidence of endocarditis.  She was stabilized and transferred to hospitalist service under my care on day 7 of her hospital stay date 01/21/2015.   Unfortunately her renal function has continued to decline, on 01/24/2015 it was decided that she would likely require permanent dialysis. IR has been consulted for a temporary dialysis catheter placement and vascular surgery for a permanent access. Assessment/Plan: Acute hypoxic respiratory failure  -due to combination of toxic encephalopathy failure to protect airway, fluid overload.  -Requiring intubation and extubation 2, finally extubated on 01/20/2015. -Pulmonary status appears stable.  -Try to titrate off oxygen, supportive care with Neb treatments as needed. 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    -9/14--case discussed with Dr. Zetta Bills -01/24/2015 it was decided that she would likely require permanent dialysis.  -01/25/15--PermCath placement -VVS for fistula creation -await CLIP process for outpt dialysis chair  Acute on chronic systolic and diastolic CHF /ischemic cardiomyopathy.  -EF 25-30%--- Reduced from 35-40% on echo 11/15 and cath 12/15.  -Underwent TTE and TEE -fluid removal via dialysis - Baseline weight appears to be 112-114 -Continue Bidil. Coreg increased to 9.375 01/23/15. -ASA 81 mg daily -defer to cardiology to add ACE/ARB now that pt is ESRD  Toxic/Metabolic encephalopathy due to uremia.  -Resolved   COPD.  -No wheezing, nebulizer treatments as needed, try to titrate off oxygen.  Dyslipidemia. -Continue Lipitor  Left cephalic vein SVT  -noted on ultrasound done in ICU on 01/18/2015.  -Supportive care.  -Discussed with vascular surgery-->No further evaluation or treatment for SVT.   DM type II.  -01/21/2015 hemoglobin A1c 6.8 -Continue Lantus 10 units daily -Continue NovoLog sliding scale -CBGs well-controlled -d/c amaryl  Hyponatremia -secondary to renal failure and fluid overload -anticipate improvement with HD  Questionable sepsis.  -No clear source, all cultures negative,  -UA was clear, chest x-ray was clear, one of her sputum cultures grew Enterobacter cloacae -in ICU was tapered down to Rocephin which she finished on 01/21/2015. Will monitor temperature curve and CBC,  -stable repeat 2 view chest x-ray on 01/21/2015.   Family Communication:   Family updated at beside Disposition Plan:   Home when medically stable and dialysis chair obtained       Procedures/Studies: Dg Chest 2 View  01/21/2015   CLINICAL DATA:  Cough.  EXAM: CHEST  2 VIEW  COMPARISON:  January 19, 2015.  FINDINGS: The heart size and mediastinal contours are within normal limits. Both lungs are clear. No  pneumothorax is noted. Minimal bilateral pleural  effusions are noted. The visualized skeletal structures are unremarkable.  IMPRESSION: Minimal bilateral pleural effusions. No other significant abnormality seen in the chest.   Electronically Signed   By: Lupita Raider, M.D.   On: 01/21/2015 20:27   US Renal  01/14/2015   CLINICAL DATA:  Renal failure. History of hypertension, COPD, diabetes.  EXAM: RENAL / URINARY TRACT ULTRASOUND COMPLETE  COMPARISON:  10/21/2014  FINDINGS: Right Kidney:  Length: 9.9 cm. Renal parenchyma is echogenic. Multiple small cysts are identified. The largest is seen in the lower pole region measuring 1.1 cm. No hydronephrosis.  Left Kidney:  Length: 10.2 cm. Echogenic renal parenchyma. Multiple small cysts are identified, largest in the lower pole region which measures 1.3 cm.  Bladder:  A Foley catheter decompresses the bladder.  Additional:  Note is made of a small amount of ascites.  IMPRESSION: 1. Echogenic kidneys bilaterally.  Bilateral renal cysts. 2. No hydronephrosis. 3. Small amount of ascites noted.   Electronically Signed   By: Norva Pavlov M.D.   On: 01/14/2015 17:44   US Biopsy  01/12/2015   INDICATION: Proteinuria of uncertain etiology. Please perform ultrasound-guided biopsy for tissue diagnostic purposes.  EXAM: ULTRASOUND GUIDED RENAL BIOPSY  COMPARISON:  Renal ultrasound - 10/21/2014  MEDICATIONS: Fentanyl 50 mcg IV; Versed 1 mg IV  ANESTHESIA/SEDATION: Total Moderate Sedation time  15 minutes  COMPLICATIONS: SIR level A: No therapy, no consequence.  Procedure complicated by development of a tiny, asymptomatic, non enlarging left-sided perinephric hematoma.  PROCEDURE: Informed written consent was obtained from the patient after a discussion of the risks, benefits and alternatives to treatment. The patient understands and consents the procedure. A timeout was performed prior to the initiation of the procedure.  Ultrasound scanning was performed of the bilateral flanks. The inferior pole of the left kidney was  selected for biopsy due to location and sonographic window. The procedure was planned. The operative site was prepped and draped in the usual sterile fashion. The overlying soft tissues were anesthetized with 1% lidocaine with epinephrine. A 17 gauge core needle biopsy device was advanced into the inferior cortex of the left kidney and 3 core biopsies were obtained under direct ultrasound guidance. Real time pathologic review confirmed adequate tissue acquisition. Images were saved for documentation purposes. The biopsy device was removed and hemostasis was obtained with manual compression.  Postprocedural scanning and demonstrated development of a very tiny perinephric hematoma which was noted to not enlarged on the acquired delayed postprocedural imaging (representative images 8 and 9). Post procedural scanning was negative for significant post procedural hemorrhage or additional complication. A dressing was placed. The patient tolerated the procedure well without immediate post procedural complication.  IMPRESSION: Technically successful ultrasound guided left renal biopsy.   Electronically Signed   By: Simonne Come M.D.   On: 01/12/2015 13:57   Dg Chest Port 1 View  01/23/2015   CLINICAL DATA:  Shortness of breath  EXAM: PORTABLE CHEST - 1 VIEW  COMPARISON:  January 21, 2015  FINDINGS: The heart size and mediastinal contours are within normal limits. There is no focal pneumonia or pulmonary edema. Minimal right pleural effusion is identified. The visualized skeletal structures are stable.  IMPRESSION: Minimal right pleural effusion.  No focal pneumonia.   Electronically Signed   By: Sherian Rein M.D.   On: 01/23/2015 09:40   Dg Chest Port 1 View  01/19/2015   CLINICAL DATA:  Renal failure.  EXAM: PORTABLE  CHEST - 1 VIEW  COMPARISON:  01/17/2015.  FINDINGS: Endotracheal tube, left IJ line, NG tube in stable position. Heart size is stable. Bibasilar pulmonary infiltrates consistent pneumonia. Minimal  interval improvement. No pleural effusion or pneumothorax.  IMPRESSION: 1. Lines and tubes in stable position. 2. Bibasilar infiltrates consistent with pneumonia. Minimal interval improvement.   Electronically Signed   By: Maisie Fus  Register   On: 01/19/2015 07:21   Dg Chest Port 1 View  01/17/2015   CLINICAL DATA:  Ventilator dependent respiratory failure. Followup left basilar atelectasis.  EXAM: PORTABLE CHEST - 1 VIEW  COMPARISON:  01/15/2015 and earlier.  FINDINGS: Endotracheal tube tip in satisfactory position approximately 4 cm above the carina. Nasogastric tube courses below the diaphragm into the stomach though its tip is not included on the image. Left jugular central venous catheter tip projects over the mid to lower SVC.  Cardiac silhouette normal in size, unchanged. Airspace opacities involving the lung bases bilaterally, right greater than left, progressive since the examination 2 days ago. Pulmonary vascularity normal. Possible small bilateral pleural effusions.  IMPRESSION: 1. Support apparatus satisfactory. 2. Pneumonia involving the lung bases, right greater than left, progressive since the examination 2 days ago. 3. Possible small bilateral pleural effusions.   Electronically Signed   By: Hulan Saas M.D.   On: 01/17/2015 11:28   Dg Chest Port 1 View  01/15/2015   CLINICAL DATA:  Septic shock, respiratory failure and acute renal failure. Question left lower lobe pneumonia.  EXAM: PORTABLE CHEST - 1 VIEW  COMPARISON:  01/14/2015  FINDINGS: Endotracheal tube with tip 3.2 cm above the carina, left IJ central venous catheter with tip overlying the superior cavoatrial junction NG tube entering the stomach with tip off the field of view noted.  Left lower lung opacity has improved.  There is no evidence of pleural effusion or pneumothorax.  IMPRESSION: Improved left lower lung opacity, suggesting atelectasis.  No other significant change.   Electronically Signed   By: Harmon Pier M.D.   On:  01/15/2015 07:32   Dg Chest Port 1 View  01/14/2015   CLINICAL DATA:  Septic shock, respiratory failure and acute renal failure. Endotracheal tube and central line placement.  EXAM: PORTABLE CHEST - 1 VIEW  COMPARISON:  01/14/2015 and prior radiographs  FINDINGS: The cardiomediastinal silhouette is unchanged.  An endotracheal tube with tip 5.1 cm above the carina, left IJ central venous catheter with tip overlying the lower SVC and NG tube entering the stomach with tip off the field of view are noted.  Left lower lung airspace disease is present.  There is no evidence of pneumothorax or pleural effusion.  IMPRESSION: Left lower lung airspace disease/ pneumonia.  Support apparatus as described.  No evidence of pneumothorax.   Electronically Signed   By: Harmon Pier M.D.   On: 01/14/2015 16:38   Dg Chest Portable 1 View  01/14/2015   CLINICAL DATA:  Sepsis.  EXAM: PORTABLE CHEST - 1 VIEW  COMPARISON:  03/25/2014.  FINDINGS: Interval borderline enlargement of the cardiac silhouette and mild increase in prominence of the interstitial markings, accentuated by a decreased inspiration. Calcified granuloma in the right lower lung zone. No pleural fluid. Diffuse osteopenia.  IMPRESSION: Interval borderline cardiomegaly and mildly increased prominence of the interstitial markings, most likely due to a poor inspiration. No gross acute abnormality.   Electronically Signed   By: Beckie Salts M.D.   On: 01/14/2015 15:21   Dg Abd Portable 1v  01/17/2015  CLINICAL DATA:  Nasogastric tube placement  EXAM: PORTABLE ABDOMEN - 1 VIEW  COMPARISON:  None.  FINDINGS: Orogastric tube with the tip projecting over the antrum of the stomach. There is no bowel dilatation to suggest obstruction. There is no evidence of pneumoperitoneum, portal venous gas or pneumatosis. There are no pathologic calcifications along the expected course of the ureters.The osseous structures are unremarkable.  IMPRESSION: Orogastric tube with the tip  projecting over the antrum of the stomach.   Electronically Signed   By: Elige Ko   On: 01/17/2015 11:19        Subjective: Patient denies fevers, chills, headache, chest pain, dyspnea, nausea, vomiting, diarrhea, abdominal pain, dysuria, hematuria   Objective: Filed Vitals:   01/24/15 2022 01/25/15 0545 01/25/15 1200 01/25/15 1233  BP: 117/62 135/80  151/69  Pulse: 80 88  89  Temp: 98.4 F (36.9 C) 98.4 F (36.9 C)  98.2 F (36.8 C)  TempSrc: Oral   Oral  Resp: Height:    (1.549 m)   Weight:  54.613 kg (120 lb 6.4 oz)    SpO2: 99% 100%  100%    Intake/Output Summary (Last 24 hours) at 01/25/15 1712 Last data filed at 01/25/15 1300  Gross per 24 hour  Intake    240 ml  Output    425 ml  Net   -185 ml   Weight change: -0.454 kg (-1 lb) Exam:   General:  Pt is alert, follows commands appropriately, not in acute distress  HEENT: No icterus, No thrush, No neck mass, [place]/AT  Cardiovascular: RRR, S1/S2, no rubs, no gallops  Respiratory: Bibasilar crackles. No wheeze. Good air movement.  Abdomen: Soft/+BS, non tender, non distended, no guarding  Extremities: 1+LE edema, No lymphangitis, No petechiae, No rashes, no synovitis  Data Reviewed: Basic Metabolic Panel:  Recent Labs Lab 01/21/15 0347 01/22/15 0500 01/23/15 0301 01/24/15 0302 01/25/15 0550  NA 128* 135 130* 123* 118*  K 3.6 4.2 4.1 4.4 4.3  CL 92* 98* 95* 88* 84*  CO2 GLUCOSE 168* 175* 125* 143* 126*  BUN 56* 24* 31* 36* 39*  CREATININE 4.46* 3.21* 4.11* 5.06* 5.54*  CALCIUM 7.9* 8.0* 7.9* 7.6* 7.6*  MG 2.2 2.0 2.2 2.1 2.1  PHOS 3.7 3.4 4.3 4.9* 5.3*   Liver Function Tests:  Recent Labs Lab 01/21/15 0347 01/22/15 0500 01/23/15 0301 01/24/15 0302 01/25/15 0550  ALBUMIN 2.7* 2.9* 2.5* 2.4* 2.5*   No results for input(s): LIPASE, AMYLASE in the last 168 hours. No results for input(s): AMMONIA in the last 168 hours. CBC:  Recent Labs Lab  01/19/15 0404 01/22/15 0500  WBC 12.1* 9.0  NEUTROABS 10.8*  --   HGB 8.5* 7.8*  HCT 26.1* 25.0*  MCV 89.1 94.7  PLT 130* 152   Cardiac Enzymes: No results for input(s): CKTOTAL, CKMB, CKMBINDEX, TROPONINI in the last 168 hours. BNP: Invalid input(s): POCBNP CBG:  Recent Labs Lab 01/24/15 1625 01/24/15 2041 01/25/15 0556 01/25/15 1110 01/25/15 1557  GLUCAP 162* 171* 119* 154* 141*    Recent Results (from the past 240 hour(s))  Urine culture     Status: None   Collection Time: 01/15/15 10:05 PM  Result Value Ref Range Status   Specimen Description URINE, CATHETERIZED  Final   Special Requests NONE  Final   Culture NO GROWTH 2 DAYS  Final   Report Status 01/17/2015 FINAL  Final  Culture, blood (routine x 2)  Status: None   Collection Time: 01/18/15  4:40 PM  Result Value Ref Range Status   Specimen Description BLOOD RIGHT ARM  Final   Special Requests BOTTLES DRAWN AEROBIC ONLY 5CC  Final   Culture NO GROWTH 5 DAYS  Final   Report Status 01/23/2015 FINAL  Final  Culture, blood (routine x 2)     Status: None   Collection Time: 01/18/15  4:45 PM  Result Value Ref Range Status   Specimen Description BLOOD LEFT ARM  Final   Special Requests BOTTLES DRAWN AEROBIC ONLY 5CC  Final   Culture NO GROWTH 5 DAYS  Final   Report Status 01/23/2015 FINAL  Final  Urine culture     Status: None   Collection Time: 01/21/15  9:00 AM  Result Value Ref Range Status   Specimen Description URINE, CLEAN CATCH  Final   Special Requests NONE  Final   Culture NO GROWTH 1 DAY  Final   Report Status 01/22/2015 FINAL  Final     Scheduled Meds: . antiseptic oral rinse  7 mL Mouth Rinse BID  . aspirin  81 mg Oral Daily  . atorvastatin  10 mg Oral q1800  . calcium acetate  667 mg Oral TID WC  . carvedilol  9.375 mg Oral BID WC  . ceFAZolin      . darbepoetin (ARANESP) injection - NON-DIALYSIS  100 mcg Subcutaneous Q Fri-1800  . dextromethorphan-guaiFENesin  1 tablet Oral BID  .  feeding supplement (NEPRO CARB STEADY)  237 mL Oral Q24H  . fentaNYL      . furosemide  80 mg Intravenous BID  . heparin      . heparin subcutaneous  5,000 Units Subcutaneous 3 times per day  . insulin aspart  0-5 Units Subcutaneous QHS  . insulin aspart  0-9 Units Subcutaneous TID WC  . insulin glargine  10 Units Subcutaneous Daily  . isosorbide-hydrALAZINE  1 tablet Oral TID  . latanoprost  1 drop Both Eyes QHS  . lidocaine      . midazolam       Continuous Infusions:    Norissa Bartee, DO  [facility] Pager [phone]  If 7PM-7AM, please contact night-coverage [email or web address] Password [email or web address] 01/25/2015, 5:12 PM   LOS: 11 days

## 2015-01-25 NOTE — Procedures (Signed)
RIJV HD catheter 23 cm SVC RA No comp 

## 2015-01-25 NOTE — Consult Note (Addendum)
Hospital Consult    Reason for Consult:  In need of permanent HD access Referring Physician:  Patel MRN #:  [id number]  History of Present Illness: This is a 63 y.o. female with a hx of CKD 3 and underwent a bx of her left kidney on 01/12/15.  Her mental status after than declined and on the 2nd day, she lost consciousness and was brought to the ER.  She was noted to be in renal failure, hyperkalemic and in respiratory failure, which did require a brief intubation.   She did have septic shock from presumed UTI and has been on abx for this.  She did have positive sputum cultures grew Enterobacter cloacae. The pt is now a partial DNR with no CPR or cardioversion.  Her renal bx revealed severe nephrosclerosis with markers indicative of chronicity/tubulointerstitial fibrosis.  Her renal function continues to worsen. She is getting a diatek catheter today placed by IR.    She does have CAD with chronic combined diastolic and systolic heart failure, which was confirmed with TTE and TEE.  There was no vegetation or evidence of endocarditis.  She does have an EF of 25-30%.  She did have an upper extremity duplex on 01/19/15, which revealed no DVT, but did have superficial thrombus in the left cephalic vein at the antecubital fossa.   She is diabetic and on Amaryl and Metformin.  She is on a statin for cholesterol management.  She is on a beta blocker, CCB for hypertension.    Past Medical History  Diagnosis Date  . PONV (postoperative nausea and vomiting)   . Hypertension   . COPD (chronic obstructive pulmonary disease)   . Diabetes mellitus without complication 10-29-12  . Coronary artery disease   . Shortness of breath dyspnea   . Anxiety   . Chronic kidney disease     Past Surgical History  Procedure Laterality Date  . Abdominal hysterectomy    . Elbow surgery Right     tendon surgery  . Tubal ligation    . Breast surgery      cyst removed  . Cholecystectomy      '74-open  .  Appendectomy      '74- open with gallbladder  . Cardiac catheterization      1 coronary stent placed  . Ganglion cyst excision Bilateral 10-29-12    wrist  . Anal fissure repair    . Colonoscopy with propofol N/A 11/17/2012    Procedure: COLONOSCOPY WITH PROPOFOL;  Surgeon: Martin K Johnson, MD;  Location: [facility];  Service: Endoscopy;  Laterality: N/A;  . Esophagogastroduodenoscopy (egd) with propofol N/A 11/17/2012    Procedure: ESOPHAGOGASTRODUODENOSCOPY (EGD) WITH PROPOFOL;  Surgeon: Martin K Johnson, MD;  Location: [facility];  Service: Endoscopy;  Laterality: N/A;  . Left heart catheterization with coronary angiogram N/A 04/14/2014    Procedure: LEFT HEART CATHETERIZATION WITH CORONARY ANGIOGRAM;  Surgeon: Henry W Smith III, MD;  Location: [facility];  Service: Cardiovascular;  Laterality: N/A;  . Tee without cardioversion N/A 01/20/2015    Procedure: TRANSESOPHAGEAL ECHOCARDIOGRAM (TEE);  Surgeon: Paula Ross V, MD;  Location: [facility];  Service: Cardiovascular;  Laterality: N/A;    No Known Allergies  Prior to Admission medications   Medication Sig Start Date End Date Taking? Authorizing Provider  ALPRAZolam (XANAX) 0.5 MG tablet Take 0.5 mg by mouth 2 (two) times daily.   Yes Historical Provider, MD  amLODipine (NORVASC) 10 MG tablet Take 10 mg by mouth daily. 09/02/14  Yes   Historical Provider, MD  amoxicillin-clavulanate (AUGMENTIN) 500-125 MG per tablet Take 1 tablet by mouth 2 (two) times daily. 01/13/15  Yes Historical Provider, MD  aspirin EC 81 MG tablet Take 81 mg by mouth daily.   Yes Historical Provider, MD  atorvastatin (LIPITOR) 10 MG tablet Take 10 mg by mouth every morning.   Yes Historical Provider, MD  carvedilol (COREG) 25 MG tablet Take 1 tablet (25 mg total) by mouth 3 (three) times daily. 04/20/14  Yes Henry W Smith, MD  furosemide (LASIX) 40 MG tablet Take 1 tablet by mouth  daily Patient taking differently: Take 2 tablet by mouth  daily 07/19/14  Yes Henry W  Smith, MD  glimepiride (AMARYL) 2 MG tablet Take 2 mg by mouth daily. 09/02/14  Yes Historical Provider, MD  iron polysaccharides (NIFEREX) 150 MG capsule Take 1 capsule (150 mg total) by mouth daily. 03/28/14  Yes Jai-Gurmukh Samtani, MD  isosorbide dinitrate (ISORDIL) 20 MG tablet Take 1 tablet (20 mg total) by mouth 3 (three) times daily. 07/29/14  Yes Henry W Smith, MD  latanoprost (XALATAN) 0.005 % ophthalmic solution Place 1 drop into both eyes at bedtime.   Yes Historical Provider, MD  metFORMIN (GLUCOPHAGE) 500 MG tablet Take 500-1,000 mg by mouth 3 (three) times daily. Takes 1000 mg in the morning, 500 mg at lunch, and 1000 mg at night   Yes Historical Provider, MD  nitroGLYCERIN (NITROSTAT) 0.4 MG SL tablet Place 0.4 mg under the tongue every 5 (five) minutes as needed for chest pain.   Yes Historical Provider, MD  predniSONE (STERAPRED UNI-PAK 21 TAB) 10 MG (21) TBPK tablet Take 10-20 mg by mouth as directed. 6 day dose pack 01/13/15  Yes Historical Provider, MD  PROAIR HFA 108 (90 BASE) MCG/ACT inhaler Inhale 2 puffs into the lungs every 4 (four) hours as needed for wheezing or shortness of breath.  01/13/15  Yes Historical Provider, MD  salmeterol (SEREVENT) 50 MCG/DOSE diskus inhaler Inhale 1 puff into the lungs 2 (two) times daily.   Yes Historical Provider, MD    Social History   Social History  . Marital Status: Married    Spouse Name: N/A  . Number of Children: N/A  . Years of Education: N/A   Occupational History  . Not on file.   Social History Main Topics  . Smoking status: Former Smoker -- 1.50 packs/day    Types: Cigarettes    Quit date: 10/29/1992  . Smokeless tobacco: Not on file  . Alcohol Use: No  . Drug Use: No  . Sexual Activity: Not Currently   Other Topics Concern  . Not on file   Social History Narrative     Family History  Problem Relation Age of Onset  . Diabetes Father   . Cancer Father   . Heart attack Mother   . Hypertension Mother   .  Hypertension Father   . Diabetes Sister   . Hypertension Sister   . Cancer Sister   . Hypertension Sister     ROS: [x] Positive   [ ] Negative   [ ] All sytems reviewed and are negative  Cardiovascular: [] chest pain/pressure [] palpitations [x] SOB lying flat [x] DOE [] pain in legs while walking [] pain in legs at rest [] pain in legs at night [] non-healing ulcers [] hx of DVT [x] swelling in legs  Pulmonary: [] productive cough [] asthma/wheezing [] home O2 [x] COPD  Neurologic: [] weakness in [] arms []   legs [] numbness in [] arms [] legs [] hx of CVA [] mini stroke []difficulty speaking or slurred speech [] temporary loss of vision in one eye [] dizziness  Hematologic: [] hx of cancer [] bleeding problems [] problems with blood clotting easily  Endocrine:   [x] diabetes [] thyroid disease  GI [] vomiting blood [] blood in stool  GU: [x] CKD/renal failure [] HD--[] M/W/F or [] T/T/S [] burning with urination [] blood in urine  Psychiatric: [x] anxiety-takes Xanax [] depression  Musculoskeletal: [] arthritis [] joint pain  Integumentary: [] rashes [] ulcers  Constitutional: [] fever [] chills   Physical Examination  Filed Vitals:   01/25/15 0545  BP: 135/80  Pulse: 88  Temp: 98.4 F (36.9 C)  Resp: 18   Body mass index is 22.76 kg/(m^2).  General:  WDWN in NAD Gait: Not observed HENT: WNL, normocephalic Pulmonary: normal non-labored breathing, without Rales, rhonchi,  wheezing Cardiac: regular, without  Murmurs, rubs or gallops; without carotid bruits Abdomen: soft, NT/ND, no masses Skin: without rashes, without ulcers  Vascular Exam/Pulses:  Right Left  Radial 2+ (normal) 2+ (normal)  Ulnar Unable to palpate  Unable to palpate   Popliteal Unable to palpate  Unable to palpate   DP 2+ (normal) 2+ (normal)  PT Unable to palpate  Unable to palpate    Extremities: without ischemic changes, without Gangrene , without  cellulitis; without open wounds; +pitting edema - right > left Musculoskeletal: no muscle wasting or atrophy  Neurologic: A&O X 3; Appropriate Affect ; SENSATION: normal; MOTOR FUNCTION:  moving all extremities equally. Speech is fluent/normal Psychiatric: Judgment intact, Mood & affect appropriate for pt's clinical situation Lymph : No Cervical, Axillary, or Inguinal lymphadenopathy    CBC    Component Value Date/Time   WBC 9.0 01/22/2015 0500   RBC 2.64* 01/22/2015 0500   RBC 3.28* 03/25/2014 1700   HGB 7.8* 01/22/2015 0500   HCT 25.0* 01/22/2015 0500   PLT 152 01/22/2015 0500   MCV 94.7 01/22/2015 0500   MCH 29.5 01/22/2015 0500   MCHC 31.2 01/22/2015 0500   RDW 16.7* 01/22/2015 0500   LYMPHSABS 0.6* 01/19/2015 0404   MONOABS 0.7 01/19/2015 0404   EOSABS 0.0 01/19/2015 0404   BASOSABS 0.0 01/19/2015 0404    BMET    Component Value Date/Time   NA 118* 01/25/2015 0550   K 4.3 01/25/2015 0550   CL 84* 01/25/2015 0550   CO2 24 01/25/2015 0550   GLUCOSE 126* 01/25/2015 0550   BUN 39* 01/25/2015 0550   CREATININE 5.54* 01/25/2015 0550   CALCIUM 7.6* 01/25/2015 0550   CALCIUM 9.4 12/06/2014 0821   GFRNONAA 7* 01/25/2015 0550   GFRAA 9* 01/25/2015 0550    COAGS: Lab Results  Component Value Date   INR 1.23 01/17/2015   INR 1.92* 01/14/2015   INR 1.10 01/12/2015     Non-Invasive Vascular Imaging:   Vein mapping ordered and pending  Statin:  Yes.   Beta Blocker:  Yes.   Aspirin:  Yes.   ACEI:  No. ARB:  No. Other antiplatelets/anticoagulants:  Yes, SQ heparin   ASSESSMENT/PLAN: This is a 63 y.o. female with worsening CKD now ESRD in need of permanent HD access,  Urosepsis, Acute on chronic renal failure, Acute respiratory failure with COPD, chronic systolic heart failure, ICM, CAD, DM  -pt going to IR now for TDC placement -await vein mapping to determine what access she may be a candidate for -the pt is   right handed.  Her upper extremity ultrasound  revealed superficial thrombosis in the left cephalic vein at the antecubital area. -discussed with the pt and family about fistula vs graft   Doreatha Massed, PA-C [facility] [phone]  Addendum  Vein mapping suggest L BC AVF vs BVT.  Pt has antecubital IV on R.  Left antecubitum has extensive echymosis. Prior cephalic vein thrombus may make L BC AVF not possible, though sometimes a thrombectomy can be completed or the segment proximal to the thrombus can be used.  L stage BVT also appears probable.  Will proceed with L arm fistula placement on 01/31/15.  Risk, benefits, and alternatives to access surgery were discussed.  The patient is aware the risks include but are not limited to: bleeding, infection, steal syndrome, nerve damage, ischemic monomelic neuropathy, failure to mature, need for additional procedures, death and stroke.  The patient agrees to proceed forward with the procedure.   Leonides Sake, MD [facility] [place] Office: [phone] Pager: [phone]  01/26/2015, 8:55 PM

## 2015-01-25 NOTE — Progress Notes (Signed)
CRITICAL VALUE ALERT  Critical value received:  Na+ of 118  Date of notification:  01/25/2015  Time of notification:  0621  Critical value read back:Yes.    Nurse who received alert:  Clydie Braun, RN  MD notified (1st page):  Merdis Delay, NP  Time of first page:  0622  MD notified (2nd page):  Time of second page:  Responding MD:    Time MD responded:

## 2015-01-25 NOTE — Progress Notes (Signed)
Advanced Heart Failure Rounding Note  PCP: Dr Kristie Cowman Primary Cardiologist: Dr Verdis Prime  Subjective:    Stable.  Looks tired.   Poor urine output with IV Lasix.  Sodium down to 118 today. To get perm cath placed today.  Objective:   Weight Range: 120 lb 6.4 oz (54.613 kg) Body mass index is 22.76 kg/(m^2).   Vital Signs:   Temp:  [98.4 F (36.9 C)-98.7 F (37.1 C)] 98.4 F (36.9 C) (09/14 0545) Pulse Rate:  [80-88] 88 (09/14 0545) Resp:  [18] 18 (09/14 0545) BP: (117-135)/(58-80) 135/80 mmHg (09/14 0545) SpO2:  [99 %-100 %] 100 % (09/14 0545) Weight:  [120 lb 6.4 oz (54.613 kg)] 120 lb 6.4 oz (54.613 kg) (09/14 0545) Last BM Date: 01/24/15  Weight change: Filed Weights   01/23/15 0528 01/24/15 0516 01/25/15 0545  Weight: 122 lb 9.6 oz (55.611 kg) 121 lb 6.4 oz (55.067 kg) 120 lb 6.4 oz (54.613 kg)    Intake/Output:   Intake/Output Summary (Last 24 hours) at 01/25/15 0825 Last data filed at 01/25/15 0100  Gross per 24 hour  Intake    960 ml  Output    725 ml  Net    235 ml     Physical Exam: General: Looks older than stated age. NAD. Reclined in bed, eating breakfast. HEENT: normal Neck: supple. JVP 8-10 cm. Carotids 2+ bilat; no bruits. No lymphadenopathy or thyromegaly . Cor: PMI nondisplaced. RRR. No rubs, gallops or murmurs appreciated Lungs: Diminished bases Abdomen: soft, non tender, non distended. No HSM noted. No bruits or masses. +BS Extremities: no cyanosis, clubbing, rash. 1+ ankle edema.  Neuro: alert & oriented x 3, cranial nerves grossly intact. moves all 4 extremities w/o difficulty. Affect pleasant.  Telemetry: NSR 80s  Labs: CBC No results for input(s): WBC, NEUTROABS, HGB, HCT, MCV, PLT in the last 72 hours. Basic Metabolic Panel  Recent Labs  01/24/15 0302 01/25/15 0550  NA 123* 118*  K 4.4 4.3  CL 88* 84*  CO2 26 24  GLUCOSE 143* 126*  BUN 36* 39*  CALCIUM 7.6* 7.6*  MG 2.1 2.1  PHOS 4.9* 5.3*   Liver Function  Tests  Recent Labs  01/24/15 0302 01/25/15 0550  ALBUMIN 2.4* 2.5*   No results for input(s): LIPASE, AMYLASE in the last 72 hours. Cardiac Enzymes No results for input(s): CKTOTAL, CKMB, CKMBINDEX, TROPONINI in the last 72 hours.  BNP: BNP (last 3 results)  Recent Labs  01/14/15 1440  BNP 2160.6*    ProBNP (last 3 results)  Recent Labs  03/25/14 0918  PROBNP 284.0*     D-Dimer No results for input(s): DDIMER in the last 72 hours. Hemoglobin A1C No results for input(s): HGBA1C in the last 72 hours. Fasting Lipid Panel No results for input(s): CHOL, HDL, LDLCALC, TRIG, CHOLHDL, LDLDIRECT in the last 72 hours. Thyroid Function Tests No results for input(s): TSH, T4TOTAL, T3FREE, THYROIDAB in the last 72 hours.  Invalid input(s): FREET3  Imaging/Studies:  Dg Chest Port 1 View  01/23/2015   CLINICAL DATA:  Shortness of breath  EXAM: PORTABLE CHEST - 1 VIEW  COMPARISON:  January 21, 2015  FINDINGS: The heart size and mediastinal contours are within normal limits. There is no focal pneumonia or pulmonary edema. Minimal right pleural effusion is identified. The visualized skeletal structures are stable.  IMPRESSION: Minimal right pleural effusion.  No focal pneumonia.   Electronically Signed   By: Sherian Rein M.D.   On: 01/23/2015 09:40  Medications:     Scheduled Medications: . antiseptic oral rinse  7 mL Mouth Rinse BID  . aspirin  81 mg Oral Daily  . atorvastatin  10 mg Oral q1800  . carvedilol  9.375 mg Oral BID WC  . darbepoetin (ARANESP) injection - NON-DIALYSIS  100 mcg Subcutaneous Q Fri-1800  . dextromethorphan-guaiFENesin  1 tablet Oral BID  . furosemide  80 mg Intravenous BID  . heparin subcutaneous  5,000 Units Subcutaneous 3 times per day  . insulin aspart  0-5 Units Subcutaneous QHS  . insulin aspart  0-9 Units Subcutaneous TID WC  . insulin glargine  10 Units Subcutaneous Daily  . isosorbide-hydrALAZINE  1 tablet Oral TID  . latanoprost   1 drop Both Eyes QHS    Infusions:    PRN Medications: acetaminophen (TYLENOL) oral liquid 160 mg/5 mL, acetaminophen, GLUCERNA, ipratropium-albuterol   Assessment/Plan   Pamela [name] is a 64 y.o. female with history of CAD, DM, COPD, HTN, CKD stage IV, and hyperlipidemia here with hypoxic respiratory failure requiring intubation and sepsis from an unknown source, acute on chronic renal failure requiring HD, and acute on chronic systolic and diastolic heart failure. 1. Septic Shock: Reason for admission. - From presumed UTI per CCM note. - Finished course of rocephin. Blood and Urine cultures negative. UA negative 2. Acute renal failure on CKD Stage IV (Baseline Cr High 2s).  - Cr continues to rise with poor UO.   - To have perm cath placed today. 3. Acute respiratory failure with COPD: Required short term intubation this admission.  - Significantly improved and stable now on 02 via [place], unable to wean completely with sats in 80s. - Finished course of prednisone and rocephin. 4. Chronic systolic HF, EF 25-30%, ICM:  - Reduced from 35-40% on echo 11/15 and cath 12/15.  - May be stress/sepsis CMP, will need follow up Echo outpatient for reassessment. - Volume status only slightly up but poor diuresis with CKD. To get perm cath placed today. - Baseline weight appears to be 112-114 previously.  120 today. - Continue to titrate meds as able. - Continue Bidil. Coreg increased to 9.375 01/23/15. - Will continue to monitor fluid status and need for HD per renal. - No ACE or ARB chronically with CKD. 5. CAD - Last cath 12/15. Severe disease first diagonal, medical treatment due to small caliber. Widely patent LAD stent, non-critical circumflex and RC disease. LVEF 35-40%.  - Medical management for now without ischemia evaluation given acute on chronic renal failure. - Troponin was negative on admission. - Continue ASA  daily and atorvastatin 6. DM - Per primary  team  Length of Stay: [address]  Graciella Freer PA-C 01/25/2015, 8:25 AM  Advanced Heart Failure Team Pager [phone] (M-F; 7a - 4p)  Please contact [facility] for night-coverage after hours (4p -7a ) and weekends on [email or web address]  Patient seen with PA, agree with the above note.  1. AKI on CKD IV: In setting of infection/sepsis. She had last HD on Saturday. Still poor UOP with IV Lasix yesterday. Short of breath and requiring oxygen. BUN/creatinine slowly rising, sodium continues to fall.   Pamela [name] cath today followed by HD.  2. ID: Septic shock initially, uncertain source. Had Enterobacter in sputum. Not on antibiotics currently. TEE showed no vegetation. No PNA on 01/21/15 CXR.  3. Cardiomyopathy: EF 25-30%, down from 35-40%. Had known ischemic cardiomyopathy. It is possible that she has a stress/sepsis cardiomyopathy superimposed on her pre-existing ischemic cardiomyopathy.  She has volume overload on exam and is requiring oxygen.  - Will need repeat echo after she has recovered from this event to look for improvement.  - Continue Bidil + Coreg at current doses.  - Volume management will be via HD. 4. CAD: See above, think this is stable. She is on ASA 81 and atorvastatin.  5. COPD: Baseline significant exertional dyspnea. 6. Hyponatremia: Worsening, poor renal function with inability to clear free water.  She is fluid restricted but sodium down to 118.  She is alert/oriented, no seizure-like activity.  Plan HD today.   Pamela [name] 01/25/2015 8:50 AM

## 2015-01-25 NOTE — Progress Notes (Addendum)
Patient ID: Pamela [name], female   DOB: 12-15-1950, 64 y.o.   MRN: [id number]  [facility] Progress Note   Assessment/ Plan:   1. Progressive chronic kidney disease-now ESRD: Renal biopsy shows severe nephrosclerosis with markers indicative of chronicity/tubulointerstitial fibrosis. Renal function continues to worsen with negligible response to furosemide-plans to initiate hemodialysis today via tunneled dialysis catheter (to be placed by interventional radiology later today). Consult vascular surgery for permanent access planning and initiate the process of outpatient dialysis unit placement. 2. Sepsis- culture data negative to date-completed empiric antibiotic course for management of sepsis 3. Anemia- low but stable-ongoing treatment for anemia of chronic kidney disease-ESA . No overt loss following biopsy.    4. Secondary hyperparathyroidism- last PTH 102 and not on vit D- phos 5.3-will start on calcium acetate 667 mg 3 times a day before meals 5. HTN/volume- some volume overload- pressure is better- on norvasc and coreg- also determined to have poor EF 6. Hyponatremia: Due to free water excretion defect with renal failure-start fluid restriction of less than 1200 mL per day with ongoing loop diuretic. Anticipate to improve with dialysis  Subjective:   Reports to be feeling tired and continues to have shortness of breath with exertion.    Objective:   BP 135/80 mmHg  Pulse 88  Temp(Src) 98.4 F (36.9 C) (Oral)  Resp 18  Wt 54.613 kg (120 lb 6.4 oz)  SpO2 100%  Intake/Output Summary (Last 24 hours) at 01/25/15 0934 Last data filed at 01/25/15 0100  Gross per 24 hour  Intake    720 ml  Output    725 ml  Net     -5 ml   Weight change: -0.454 kg (-1 lb)  Physical Exam: XBJ:[id number] to be comfortable resting in her recliner, family members at bedside  OZH:YQMVH regular in rate and rhythm, S1 and S2 normal  Resp:Fine rales bibasally with expiratory wheeze   QIO:NGEX, flat, nontender Ext: No LE edema  Imaging: No results found.  Labs: BMET  Recent Labs Lab 01/19/15 0403  01/19/15 2207 01/20/15 0548 01/21/15 0347 01/22/15 0500 01/23/15 0301 01/24/15 0302 01/25/15 0550  NA 133*  --   --  136 128* 135 130* 123* 118*  K 3.0*  --   --  3.3* 3.6 4.2 4.1 4.4 4.3  CL 93*  --   --  95* 92* 98* 95* 88* 84*  CO2 28  --   --  GLUCOSE 266*  --   --  173* 168* 175* 125* 143* 126*  BUN 39*  --   --  55* 56* 24* 31* 36* 39*  CREATININE 2.48*  --   --  3.76* 4.46* 3.21* 4.11* 5.06* 5.54*  CALCIUM 8.2*  --   --  8.3* 7.9* 8.0* 7.9* 7.6* 7.6*  PHOS 3.5  < > 3.2 3.6 3.7 3.4 4.3 4.9* 5.3*  < > = values in this interval not displayed. CBC  Recent Labs Lab 01/19/15 0404 01/22/15 0500  WBC 12.1* 9.0  NEUTROABS 10.8*  --   HGB 8.5* 7.8*  HCT 26.1* 25.0*  MCV 89.1 94.7  PLT 130* 152   Medications:    . antiseptic oral rinse  7 mL Mouth Rinse BID  . aspirin  81 mg Oral Daily  . atorvastatin  10 mg Oral q1800  . carvedilol  9.375 mg Oral BID WC  . darbepoetin (ARANESP) injection - NON-DIALYSIS  100 mcg Subcutaneous Q Fri-1800  .  dextromethorphan-guaiFENesin  1 tablet Oral BID  . furosemide  80 mg Intravenous BID  . heparin subcutaneous  5,000 Units Subcutaneous 3 times per day  . insulin aspart  0-5 Units Subcutaneous QHS  . insulin aspart  0-9 Units Subcutaneous TID WC  . insulin glargine  10 Units Subcutaneous Daily  . isosorbide-hydrALAZINE  1 tablet Oral TID  . latanoprost  1 drop Both Eyes QHS   Zetta Bills, MD 01/25/2015, 9:34 AM

## 2015-01-25 NOTE — Progress Notes (Signed)
Nutrition Follow-up   INTERVENTION:  Nepro Shake po once daily, each supplement provides 425 kcal and 19 grams protein   NUTRITION DIAGNOSIS:  Inadequate oral intake related to acute illness as evidenced by NPO status; discontinued/resolved  Increased nutrient needs related to chronic illness as evidenced by estimated needs.   GOAL:   Patient will meet greater than or equal to 90% of their needs  Unmet  MONITOR:   Diet advancement, PO intake, Labs, Weight trends  REASON FOR ASSESSMENT:   Consult Enteral/tube feeding initiation and management  ASSESSMENT:   Patient admitted on 9/3 with acute renal failure, septic shock and respiratory failure. Required intubation on 9/3 and was extubated on 9/4.  Pt with progressive chronic kidney disease, now ESRD. Pt was extubated on 9/8 and diet was advanced on 9/9. Per nursing notes, pt is eating 25-100% of meals, 75% of most. Pt now with stage II pressure ulcer on buttock. Plan for HD today. Pt states that her appetite is good and she is eating most of her meals. She reports a usual body weight of 116 lbs PTA. Pt agreeable to drinking Nepro Shake once daily to improve nutrient intake. She would like to discuss renal diet but, is about to be transported for placement of dialysis catheter. RD will follow-up.   Diet Order:  Diet NPO time specified Except for: Sips with Meds  Skin:  Wound (see comment) (stage II pressure ulcer on buttocks)  Last BM:  9/13  Height:   Ht Readings from Last 1 Encounters:  01/12/15 5\' 1"  (1.549 m)    Weight:   Wt Readings from Last 1 Encounters:  01/25/15 120 lb 6.4 oz (54.613 kg)    Ideal Body Weight:  47.7 kg  BMI:  Body mass index is 22.76 kg/(m^2).  Estimated Nutritional Needs:   Kcal:  1700-1900  Protein:  65-80 grams  Fluid:  1.7-1.9 L/day  EDUCATION NEEDS:   No education needs identified at this time  Dorothea Ogle RD, LDN Inpatient Clinical Dietitian Pager: [phone] After  Hours Pager: [phone]

## 2015-01-26 ENCOUNTER — Inpatient Hospital Stay (HOSPITAL_COMMUNITY): Payer: Medicare Other

## 2015-01-26 DIAGNOSIS — N185 Chronic kidney disease, stage 5: Secondary | ICD-10-CM

## 2015-01-26 DIAGNOSIS — N186 End stage renal disease: Secondary | ICD-10-CM

## 2015-01-26 LAB — RENAL FUNCTION PANEL
ANION GAP: 10 (ref 5–15)
Albumin: 2.3 g/dL — ABNORMAL LOW (ref 3.5–5.0)
BUN: 17 mg/dL (ref 6–20)
CHLORIDE: 93 mmol/L — AB (ref 101–111)
CO2: 26 mmol/L (ref 22–32)
Calcium: 7.5 mg/dL — ABNORMAL LOW (ref 8.9–10.3)
Creatinine, Ser: 3.08 mg/dL — ABNORMAL HIGH (ref 0.44–1.00)
GFR calc non Af Amer: 15 mL/min — ABNORMAL LOW (ref >60)
GFR, EST AFRICAN AMERICAN: 17 mL/min — AB (ref >60)
GLUCOSE: 203 mg/dL — AB (ref 65–99)
Phosphorus: 3.7 mg/dL (ref 2.5–4.6)
Potassium: 3.6 mmol/L (ref 3.5–5.1)
Sodium: 129 mmol/L — ABNORMAL LOW (ref 135–145)

## 2015-01-26 LAB — GLUCOSE, CAPILLARY
GLUCOSE-CAPILLARY: 193 mg/dL — AB (ref 65–99)
GLUCOSE-CAPILLARY: 205 mg/dL — AB (ref 65–99)
GLUCOSE-CAPILLARY: 251 mg/dL — AB (ref 65–99)
Glucose-Capillary: 325 mg/dL — ABNORMAL HIGH (ref 65–99)

## 2015-01-26 LAB — MAGNESIUM: Magnesium: 1.9 mg/dL (ref 1.7–2.4)

## 2015-01-26 MED ORDER — CARVEDILOL 12.5 MG PO TABS
12.5000 mg | ORAL_TABLET | Freq: Two times a day (BID) | ORAL | Status: DC
  Administered 2015-01-26 – 2015-02-01 (×13): 12.5 mg via ORAL
  Filled 2015-01-26 (×2): qty 1
  Filled 2015-01-26 (×2): qty 2
  Filled 2015-01-26 (×8): qty 1

## 2015-01-26 MED ORDER — INSULIN ASPART 100 UNIT/ML ~~LOC~~ SOLN: 3.0000 [IU] | Freq: Three times a day (TID) | SUBCUTANEOUS | Status: DC

## 2015-01-26 MED ORDER — SALMETEROL XINAFOATE 50 MCG/DOSE IN AEPB
1.0000 | INHALATION_SPRAY | Freq: Two times a day (BID) | RESPIRATORY_TRACT | Status: DC
  Administered 2015-01-26 – 2015-01-30 (×7): 1 via RESPIRATORY_TRACT
  Filled 2015-01-26 (×2): qty 0

## 2015-01-26 MED ORDER — INSULIN ASPART 100 UNIT/ML ~~LOC~~ SOLN
4.0000 [IU] | Freq: Three times a day (TID) | SUBCUTANEOUS | Status: DC
  Administered 2015-01-26 – 2015-01-30 (×11): 4 [IU] via SUBCUTANEOUS

## 2015-01-26 MED ORDER — HEPARIN SODIUM (PORCINE) 1000 UNIT/ML DIALYSIS: 40.0000 [IU]/kg | INTRAMUSCULAR | Status: DC | PRN

## 2015-01-26 NOTE — Care Management Important Message (Signed)
Important Message  Patient Details  Name: MARIJAH MOLENDA MRN: [id number] Date of Birth: [birth year]   Medicare Important Message Given:  Yes-third notification given    Orson Aloe 01/26/2015, 8:42 AM

## 2015-01-26 NOTE — Progress Notes (Signed)
CARDIAC REHAB PHASE I   PRE:  Rate/Rhythm: 110 ST  BP:  Sitting: 127/47        SaO2: 100 0.75L, 97 RA  MODE:  Ambulation: 60 ft   POST:  Rate/Rhythm: 122 ST  BP:  Sitting: 132/53         SaO2: 98 RA  Pt in bed, on 0.75 L O2. Pt weaned to RA, sats 97-98% on RA. Pt tachycardic, low 100s at rest. Pt states she is very tired but agreeable to ambulate. Pt stood independently. Pt ambulated 60 ft on RA, rolling walker, gait belt, standby assist, steady gait, tolerated fair. Pt c/o fatigue, DOE, denies CP, dizziness, brief standing rest x1. Pt limited by fatigue and elevated HR. Pt HR 110s-138 ST, sats 89-95% on RA during ambulation. Pt does not qualify for home O2 based on her performance today. Pt returned to room, sats 98% on RA. Began CHF education with pt family at bedside. Reviewed CHF booklet and zone tool, daily weights, sodium and fluid restrictions. Will defer diet at this time to renal diet recommendations. Pt verbalized understanding. Pt to edge of bed after walk per pt request, call bell within reach, family at bedside. Pt remains on RA at end of education, sats 97% on RA, HR improved to low 100s with rest. RN updated. Pt will need reinforcement of education, will follow.  [phone]  Joylene Grapes, RN, BSN 01/26/2015 9:36 AM

## 2015-01-26 NOTE — Progress Notes (Signed)
Advanced Heart Failure Rounding Note  PCP: Dr Kristie Cowman Primary Cardiologist: Dr Verdis Prime  Subjective:    Feels better this morning. Breathing feels good.  Has not needed breathing treatment since ICU.  Will need dialysis 3 times a week.  Out 2.2 L with HD + Lasix 80 mg IV BID ( UO). Weight down 8 lbs. Sodium up from 118 -> 129.  Objective:   Weight Range: 112 lb 14 oz (51.2 kg) Body mass index is 21.34 kg/(m^2).   Vital Signs:   Temp:  [97.8 F (36.6 C)-98.9 F (37.2 C)] 98.1 F (36.7 C) (09/15 0513) Pulse Rate:  [88-106] 93 (09/15 0513) Resp:  [14-18] 16 (09/15 0513) BP: (104-151)/(53-83) 122/53 mmHg (09/15 0513) SpO2:  [96 %-100 %] 100 % (09/15 0513) Weight:  [112 lb 14 oz (51.2 kg)-117 lb 1 oz (53.1 kg)] 112 lb 14 oz (51.2 kg) (09/15 0126) Last BM Date: 01/25/15  Weight change: Filed Weights   01/25/15 0545 01/25/15 2153 01/26/15 0126  Weight: 120 lb 6.4 oz (54.613 kg) 117 lb 1 oz (53.1 kg) 112 lb 14 oz (51.2 kg)    Intake/Output:   Intake/Output Summary (Last 24 hours) at 01/26/15 0742 Last data filed at 01/26/15 1610  Gross per 24 hour  Intake  563.2 ml  Output   2800 ml  Net -2236.8 ml     Physical Exam: General: Looks older than stated age. NAD. HEENT: normal Neck: supple. JVP 8-9 cm. Carotids 2+ bilat; no bruits. No lymphadenopathy or thyromegaly noted. Cor: PMI nondisplaced. RRR. No rubs, gallops or murmurs. Perm cath placed R chest.  Dressing clean, dry, and intact. Lungs: Diminished bases with wheezes in right lower lobe. Abdomen: soft, non tender, non distended. No HSM noted. No bruits or masses. Good bowel sounds. Extremities: no cyanosis, clubbing, rash. 1+ ankle edema Neuro: alert & oriented x 3, cranial nerves grossly intact. moves all 4 extremities w/o difficulty. Affect pleasant.  Telemetry: NSR 90-100s  Labs: CBC No results for input(s): WBC, NEUTROABS, HGB, HCT, MCV, PLT in the last 72 hours. Basic Metabolic  Panel  Recent Labs  96/04/54 0550 01/26/15 0410  NA 118* 129*  K 4.3 3.6  CL 84* 93*  CO2 24 26  GLUCOSE 126* 203*  BUN 39* 17  CALCIUM 7.6* 7.5*  MG 2.1 1.9  PHOS 5.3* 3.7   Liver Function Tests  Recent Labs  01/25/15 0550 01/26/15 0410  ALBUMIN 2.5* 2.3*   No results for input(s): LIPASE, AMYLASE in the last 72 hours. Cardiac Enzymes No results for input(s): CKTOTAL, CKMB, CKMBINDEX, TROPONINI in the last 72 hours.  BNP: BNP (last 3 results)  Recent Labs  01/14/15 1440  BNP 2160.6*    ProBNP (last 3 results)  Recent Labs  03/25/14 0918  PROBNP 284.0*     D-Dimer No results for input(s): DDIMER in the last 72 hours. Hemoglobin A1C No results for input(s): HGBA1C in the last 72 hours. Fasting Lipid Panel No results for input(s): CHOL, HDL, LDLCALC, TRIG, CHOLHDL, LDLDIRECT in the last 72 hours. Thyroid Function Tests No results for input(s): TSH, T4TOTAL, T3FREE, THYROIDAB in the last 72 hours.  Invalid input(s): FREET3  Imaging/Studies:  No results found.   Medications:     Scheduled Medications: . antiseptic oral rinse  7 mL Mouth Rinse BID  . aspirin  81 mg Oral Daily  . atorvastatin  10 mg Oral q1800  . calcium acetate  667 mg Oral TID WC  . carvedilol  9.375  mg Oral BID WC  . darbepoetin (ARANESP) injection - NON-DIALYSIS  100 mcg Subcutaneous Q Fri-1800  . dextromethorphan-guaiFENesin  1 tablet Oral BID  . feeding supplement (NEPRO CARB STEADY)  237 mL Oral Q24H  . furosemide  80 mg Intravenous BID  . heparin subcutaneous  5,000 Units Subcutaneous 3 times per day  . insulin aspart  0-5 Units Subcutaneous QHS  . insulin aspart  0-9 Units Subcutaneous TID WC  . insulin glargine  10 Units Subcutaneous Daily  . isosorbide-hydrALAZINE  1 tablet Oral TID  . latanoprost  1 drop Both Eyes QHS    Infusions:    PRN Medications: sodium chloride, sodium chloride, acetaminophen (TYLENOL) oral liquid 160 mg/5 mL, acetaminophen, alteplase,  GLUCERNA, heparin, heparin, ipratropium-albuterol, lidocaine (PF), lidocaine-prilocaine, pentafluoroprop-tetrafluoroeth   Assessment/Plan   Pamela [name] is a 64 y.o. female with history of CAD, DM, COPD, HTN, CKD stage IV, and hyperlipidemia here with hypoxic respiratory failure requiring intubation and sepsis from an unknown source, acute on chronic renal failure requiring HD, and acute on chronic systolic and diastolic heart failure. 1. Septic Shock: Reason for admission. - From presumed UTI s/p renal bx despite negative Urine Cx and UA - Finished course of rocephin. Blood Cx negative. 2. Acute renal failure on CKD Stage IV (Baseline Cr High 2s).  - Now requires HD. 3. Acute respiratory failure with COPD:  - Required short term intubation but now stable on 02 via [place]. - Room air sats in 80s. Cardiac rehab to see and assess need for home 02. - Finished course of prednisone and rocephin. - Has some wheezing today.  ? Restart home COPD meds 4. Chronic systolic HF, EF 25-30%, ICM:  - Volume status improved and weight at baseline following HD. - Consider transition to po lasix with regular HD. - Reduced from 35-40% on echo 11/15 and cath 12/15.  - May be stress/sepsis CMP, will need follow up Echo outpatient for reassessment. - Continue to titrate meds as able. - Continue Bidil. Coreg increased to 9.375 01/23/15. - No ACE or ARB chronically with CKD. 5. CAD - Last cath 12/15. Severe disease first diagonal, medical treatment due to small caliber. Widely patent LAD stent, non-critical circumflex and RC disease. LVEF 35-40%.  - Troponin negative on admission. - Continue ASA and statin 6. DM - Per primary team  Length of Stay: 12  Graciella Freer PA-C  Patient seen with PA, agree with the above note.  1. AKI on CKD IV: In setting of infection/sepsis. Now back on HD, plan to get fistula. 2. ID: Septic shock initially, uncertain source. Had Enterobacter in sputum. Not  on antibiotics currently. TEE showed no vegetation. No PNA on 01/21/15 CXR.  3. Cardiomyopathy: EF 25-30%, down from 35-40%. Had known ischemic cardiomyopathy. It is possible that she has a stress/sepsis cardiomyopathy superimposed on her pre-existing ischemic cardiomyopathy. Dialyzed yesterday with fluid removal, still some volume overload on exam. - Will need repeat echo after she has recovered from this event to look for improvement.  - Think we can stop Lasix at this point as she is getting HD (unless renal has a reason to continue it).  - Coreg 12.5 mg bid.  - Suspect we should replace Bidil with ACEI if she is going to be long-term dialysis dependent.    4. CAD: See above, think this is stable. She is on ASA 81 and atorvastatin.  5. COPD: Baseline significant exertional dyspnea. 6. Hyponatremia: Improved with HD.  Emnet Mon[employer]  01/26/2015, 7:42 AM  Advanced Heart Failure Team Pager [phone] (M-F; 7a - 4p)  Please contact [facility] for night-coverage after hours (4p -7a ) and weekends on [email or web address]

## 2015-01-26 NOTE — Progress Notes (Signed)
PROGRESS NOTE  Pamela [name] ZOX:[id number] DOB: 03-18-1951 DOA: 01/14/2015 PCP: Georgann Housekeeper, MD  Brief History pleasant 64 year old Caucasian female with history of hypertensive nephrosclerosis with CKD stage IV under the care of Dr. Kathrene Bongo, CAD with chronic combined diastolic and systolic heart failure EF around 30%, mild AS, essential hypertension, dyslipidemia who underwent an outpatient kidney biopsy on 01/12/2015 thereafter presented to the ER on 01/14/2015 for decreased mentation due to uremia. She was found to be in acute renal failure with hyperkalemia, developed respiratory failure due to combination of fluid overload along with failure to protect airway due to uremia. She was intubated and kept in ICU. She was seen by cardiology and nephrology. Right femoral Vas-Cath was placed and she was started on dialysis this admission. She underwent TTE along with TEE both confirming severe combined systolic and diastolic CHF however no evidence of endocarditis.  She was stabilized and transferred to hospitalist service on day 7 of her hospital stay date 01/21/2015.  Unfortunately her renal function has continued to decline, on 01/24/2015 it was decided that she would likely require permanent dialysis. IR has been consulted for a temporary dialysis catheter placement on 9/14 and vascular surgery for a permanent access. Assessment/Plan: Acute hypoxic respiratory failure  -due to combination of toxic encephalopathy failure to protect airway, fluid overload.  -Requiring intubation and extubation 2, finally extubated on 01/20/2015. -Pulmonary status appears stable.  -Try to titrate off oxygen, supportive care with Neb treatments as needed. 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  -9/14--case discussed with Dr. Zetta Bills -01/24/2015 it was decided that she would likely require permanent dialysis.  -01/25/15--PermCath placement and HD -VVS for fistula creation -await CLIP process for outpt dialysis chair  Acute on chronic systolic and diastolic CHF /ischemic cardiomyopathy.  -EF 25-30%--- Reduced from 35-40% on echo 11/15 and cath 12/15.  -Underwent TTE and TEE -fluid removal via dialysis - Baseline weight appears to be 112-114 -negative 16 pounds since admission -Continue Bidil. Coreg increased to 12.5 bid  01/26/15. -ASA 81 mg daily -continue statin -defer to cardiology to add ACE/ARB now that pt is ESRD  Toxic/Metabolic encephalopathy due to uremia.  -Resolved   COPD.  -No wheezing, nebulizer treatments as needed -stable on RA -serevent restarted  Dyslipidemia. -Continue Lipitor  Left cephalic vein SVT  -noted on ultrasound done in ICU on 01/18/2015.  -Supportive care.  -Discussed with vascular surgery-->No further evaluation or treatment for SVT.   DM type II.  -01/21/2015 hemoglobin A1c 6.8 -Continue Lantus 10 units daily -Continue NovoLog sliding scale -CBGs increasing with increasing po intake -add novolog 4 units premeal -d/c amaryl -will not restart metformin after d/c  Hyponatremia -secondary to renal failure and fluid overload -improvement with HD  Questionable sepsis.  -No clear source, all cultures negative,  -UA was clear, chest x-ray was clear, one of her sputum cultures grew Enterobacter cloacae -in ICU was tapered down to Rocephin which she finished on 01/21/2015. Will monitor temperature curve and CBC,  -stable repeat 2 view chest x-ray on 01/21/2015.   Family Communication: Husband updated at beside Disposition Plan: Home when medically stable and dialysis chair obtained        Procedures/Studies: Dg Chest 2 View  01/21/2015   CLINICAL DATA:  Cough.  EXAM: CHEST  2 VIEW  COMPARISON:  January 19, 2015.  FINDINGS: The heart size and mediastinal contours are within normal limits. Both lungs are clear. No pneumothorax is noted. Minimal bilateral pleural effusions are noted. The visualized skeletal structures are unremarkable.  IMPRESSION: Minimal bilateral pleural effusions. No other significant abnormality seen in the chest.   Electronically Signed   By: Lupita Raider, M.D.   On: 01/21/2015 20:27   US Renal  01/14/2015   CLINICAL DATA:  Renal failure. History of hypertension, COPD, diabetes.  EXAM: RENAL / URINARY TRACT ULTRASOUND COMPLETE  COMPARISON:  10/21/2014  FINDINGS: Right Kidney:  Length: 9.9 cm. Renal parenchyma is echogenic. Multiple small cysts are identified. The largest is seen in the lower pole region measuring 1.1 cm. No hydronephrosis.  Left Kidney:  Length: 10.2 cm. Echogenic renal parenchyma. Multiple small cysts are identified, largest in the lower pole region which measures 1.3 cm.  Bladder:  A Foley catheter decompresses the bladder.  Additional:  Note is made of a small amount of ascites.  IMPRESSION: 1. Echogenic kidneys bilaterally.  Bilateral renal cysts. 2. No hydronephrosis. 3. Small amount of ascites noted.   Electronically Signed   By: Norva Pavlov M.D.   On: 01/14/2015 17:44   US Biopsy  01/12/2015   INDICATION: Proteinuria of uncertain etiology. Please perform ultrasound-guided biopsy for tissue diagnostic purposes.  EXAM: ULTRASOUND GUIDED RENAL BIOPSY  COMPARISON:  Renal ultrasound - 10/21/2014  MEDICATIONS: Fentanyl 50 mcg IV; Versed 1 mg IV  ANESTHESIA/SEDATION: Total Moderate Sedation time  15 minutes  COMPLICATIONS: SIR level A: No therapy, no consequence.  Procedure complicated by development of a tiny, asymptomatic, non enlarging left-sided perinephric hematoma.  PROCEDURE: Informed written consent was obtained from the patient after a discussion of the risks, benefits and alternatives to treatment. The patient understands and consents  the procedure. A timeout was performed prior to the initiation of the procedure.  Ultrasound scanning was performed of the bilateral flanks. The inferior pole of the left kidney was selected for biopsy due to location and sonographic window. The procedure was planned. The operative site was prepped and draped in the usual sterile fashion. The overlying soft tissues were anesthetized with 1% lidocaine with epinephrine. A 17 gauge core needle biopsy device was advanced into the inferior cortex of the left kidney and 3 core biopsies were obtained under direct ultrasound guidance. Real time pathologic review confirmed adequate tissue acquisition. Images were saved for documentation purposes. The biopsy device was removed and hemostasis was obtained with manual compression.  Postprocedural scanning and demonstrated development of a very tiny perinephric hematoma which was noted to not enlarged on the acquired delayed postprocedural imaging (representative images 8 and 9). Post procedural scanning was negative for significant post procedural hemorrhage or additional complication. A dressing was placed. The patient tolerated the procedure well without immediate post procedural complication.  IMPRESSION: Technically successful ultrasound guided left renal biopsy.   Electronically Signed   By: Simonne Come M.D.   On: 01/12/2015 13:57   Ir Fluoro Guide Cv Line Right  01/26/2015   CLINICAL DATA:  Renal disease  EXAM: TUNNELED DIALYSIS CATHETER PLACEMENT, ULTRASOUND GUIDANCE FOR VASCULAR ACCESS  FLUOROSCOPY TIME:  12 seconds  MEDICATIONS AND MEDICAL HISTORY: Versed 0.5 mg, Fentanyl 25 mcg.  As antibiotic prophylaxis, Ancef was ordered pre-procedure and administered intravenously within one hour of incision.  ANESTHESIA/SEDATION: Moderate sedation time: 15 minutes  CONTRAST:  None  PROCEDURE: The procedure, risks, benefits, and alternatives were explained to the patient. Questions  regarding the procedure were encouraged and  answered. The patient understands and consents to the procedure.  The right neck was prepped with Betadine in a sterile fashion, and a sterile drape was applied covering the operative field. A sterile gown and sterile gloves were used for the procedure. 1% lidocaine into the skin and subcutaneous tissue. The right internal jugular vein was noted to be patent initially with ultrasound. Under sonographic guidance, a micropuncture needle was inserted into the right IJ vein (Ultrasound and fluoroscopic image documentation was performed). It was removed over an 018 wire which was upsized to an Amplatz. This was advanced into the IVC.  A small incision was made in the right upper chest. The tunneling device was utilized to advance the 23 centimeter tip to cuff catheter from the chest incision and out the neck incision. A peel-away sheath was advanced over the Amplatz wire. The leading edge of the catheter was then advanced through the peel-away sheath. The peel-away sheath was removed. It was flushed and instilled with heparin. The chest incision was closed with a 0 Prolene pursestring stitch. The neck incision was closed with a 4-0 Vicryl subcuticular stitch.  COMPLICATIONS: None  FINDINGS: The image demonstrates placement of a tunneled dialysis catheter with its tip in the right atrium.  IMPRESSION: Successful right IJ vein tunneled dialysis catheter with its tip in the right atrium.   Electronically Signed   By: Jolaine Click M.D.   On: 01/26/2015 12:45   Ir US Guide Vasc Access Right  01/26/2015   CLINICAL DATA:  Renal disease  EXAM: TUNNELED DIALYSIS CATHETER PLACEMENT, ULTRASOUND GUIDANCE FOR VASCULAR ACCESS  FLUOROSCOPY TIME:  12 seconds  MEDICATIONS AND MEDICAL HISTORY: Versed 0.5 mg, Fentanyl 25 mcg.  As antibiotic prophylaxis, Ancef was ordered pre-procedure and administered intravenously within one hour of incision.  ANESTHESIA/SEDATION: Moderate sedation time: 15 minutes  CONTRAST:  None  PROCEDURE: The  procedure, risks, benefits, and alternatives were explained to the patient. Questions regarding the procedure were encouraged and answered. The patient understands and consents to the procedure.  The right neck was prepped with Betadine in a sterile fashion, and a sterile drape was applied covering the operative field. A sterile gown and sterile gloves were used for the procedure. 1% lidocaine into the skin and subcutaneous tissue. The right internal jugular vein was noted to be patent initially with ultrasound. Under sonographic guidance, a micropuncture needle was inserted into the right IJ vein (Ultrasound and fluoroscopic image documentation was performed). It was removed over an 018 wire which was upsized to an Amplatz. This was advanced into the IVC.  A small incision was made in the right upper chest. The tunneling device was utilized to advance the 23 centimeter tip to cuff catheter from the chest incision and out the neck incision. A peel-away sheath was advanced over the Amplatz wire. The leading edge of the catheter was then advanced through the peel-away sheath. The peel-away sheath was removed. It was flushed and instilled with heparin. The chest incision was closed with a 0 Prolene pursestring stitch. The neck incision was closed with a 4-0 Vicryl subcuticular stitch.  COMPLICATIONS: None  FINDINGS: The image demonstrates placement of a tunneled dialysis catheter with its tip in the right atrium.  IMPRESSION: Successful right IJ vein tunneled dialysis catheter with its tip in the right atrium.   Electronically Signed   By: Jolaine Click M.D.   On: 01/26/2015 12:45   Dg Chest Port 1 View  01/23/2015  CLINICAL DATA:  Shortness of breath  EXAM: PORTABLE CHEST - 1 VIEW  COMPARISON:  January 21, 2015  FINDINGS: The heart size and mediastinal contours are within normal limits. There is no focal pneumonia or pulmonary edema. Minimal right pleural effusion is identified. The visualized skeletal structures  are stable.  IMPRESSION: Minimal right pleural effusion.  No focal pneumonia.   Electronically Signed   By: Sherian Rein M.D.   On: 01/23/2015 09:40   Dg Chest Port 1 View  01/19/2015   CLINICAL DATA:  Renal failure.  EXAM: PORTABLE CHEST - 1 VIEW  COMPARISON:  01/17/2015.  FINDINGS: Endotracheal tube, left IJ line, NG tube in stable position. Heart size is stable. Bibasilar pulmonary infiltrates consistent pneumonia. Minimal interval improvement. No pleural effusion or pneumothorax.  IMPRESSION: 1. Lines and tubes in stable position. 2. Bibasilar infiltrates consistent with pneumonia. Minimal interval improvement.   Electronically Signed   By: Maisie Fus  Register   On: 01/19/2015 07:21   Dg Chest Port 1 View  01/17/2015   CLINICAL DATA:  Ventilator dependent respiratory failure. Followup left basilar atelectasis.  EXAM: PORTABLE CHEST - 1 VIEW  COMPARISON:  01/15/2015 and earlier.  FINDINGS: Endotracheal tube tip in satisfactory position approximately 4 cm above the carina. Nasogastric tube courses below the diaphragm into the stomach though its tip is not included on the image. Left jugular central venous catheter tip projects over the mid to lower SVC.  Cardiac silhouette normal in size, unchanged. Airspace opacities involving the lung bases bilaterally, right greater than left, progressive since the examination 2 days ago. Pulmonary vascularity normal. Possible small bilateral pleural effusions.  IMPRESSION: 1. Support apparatus satisfactory. 2. Pneumonia involving the lung bases, right greater than left, progressive since the examination 2 days ago. 3. Possible small bilateral pleural effusions.   Electronically Signed   By: Hulan Saas M.D.   On: 01/17/2015 11:28   Dg Chest Port 1 View  01/15/2015   CLINICAL DATA:  Septic shock, respiratory failure and acute renal failure. Question left lower lobe pneumonia.  EXAM: PORTABLE CHEST - 1 VIEW  COMPARISON:  01/14/2015  FINDINGS: Endotracheal tube with tip  3.2 cm above the carina, left IJ central venous catheter with tip overlying the superior cavoatrial junction NG tube entering the stomach with tip off the field of view noted.  Left lower lung opacity has improved.  There is no evidence of pleural effusion or pneumothorax.  IMPRESSION: Improved left lower lung opacity, suggesting atelectasis.  No other significant change.   Electronically Signed   By: Harmon Pier M.D.   On: 01/15/2015 07:32   Dg Chest Port 1 View  01/14/2015   CLINICAL DATA:  Septic shock, respiratory failure and acute renal failure. Endotracheal tube and central line placement.  EXAM: PORTABLE CHEST - 1 VIEW  COMPARISON:  01/14/2015 and prior radiographs  FINDINGS: The cardiomediastinal silhouette is unchanged.  An endotracheal tube with tip 5.1 cm above the carina, left IJ central venous catheter with tip overlying the lower SVC and NG tube entering the stomach with tip off the field of view are noted.  Left lower lung airspace disease is present.  There is no evidence of pneumothorax or pleural effusion.  IMPRESSION: Left lower lung airspace disease/ pneumonia.  Support apparatus as described.  No evidence of pneumothorax.   Electronically Signed   By: Harmon Pier M.D.   On: 01/14/2015 16:38   Dg Chest Portable 1 View  01/14/2015   CLINICAL DATA:  Sepsis.  EXAM: PORTABLE CHEST - 1 VIEW  COMPARISON:  03/25/2014.  FINDINGS: Interval borderline enlargement of the cardiac silhouette and mild increase in prominence of the interstitial markings, accentuated by a decreased inspiration. Calcified granuloma in the right lower lung zone. No pleural fluid. Diffuse osteopenia.  IMPRESSION: Interval borderline cardiomegaly and mildly increased prominence of the interstitial markings, most likely due to a poor inspiration. No gross acute abnormality.   Electronically Signed   By: Beckie Salts M.D.   On: 01/14/2015 15:21   Dg Abd Portable 1v  01/17/2015   CLINICAL DATA:  Nasogastric tube placement  EXAM:  PORTABLE ABDOMEN - 1 VIEW  COMPARISON:  None.  FINDINGS: Orogastric tube with the tip projecting over the antrum of the stomach. There is no bowel dilatation to suggest obstruction. There is no evidence of pneumoperitoneum, portal venous gas or pneumatosis. There are no pathologic calcifications along the expected course of the ureters.The osseous structures are unremarkable.  IMPRESSION: Orogastric tube with the tip projecting over the antrum of the stomach.   Electronically Signed   By: Elige Ko   On: 01/17/2015 11:19         Subjective: Patient denies fevers, chills, headache, chest pain, dyspnea, nausea, vomiting, diarrhea, abdominal pain, dysuria, hematuria   Objective: Filed Vitals:   01/26/15 0513 01/26/15 0843 01/26/15 1206 01/26/15 1243  BP: 122/53 129/45 126/51   Pulse: 93 105 104 125  Temp: 98.1 F (36.7 C)  98.7 F (37.1 C)   TempSrc: Oral  Oral   Resp: 16     Height:      Weight:      SpO2: 100%  95% 92%    Intake/Output Summary (Last 24 hours) at 01/26/15 1618 Last data filed at 01/26/15 1158  Gross per 24 hour  Intake 1038.2 ml  Output   3000 ml  Net -1961.8 ml   Weight change: -1.513 kg (-3 lb 5.4 oz) Exam:   General:  Pt is alert, follows commands appropriately, not in acute distress  HEENT: No icterus, No thrush, No neck mass, [place]/AT  Cardiovascular: RRR, S1/S2, no rubs, no gallops  Respiratory: Fine bibasilar crackles. No wheezing.  Abdomen: Soft/+BS, non tender, non distended, no guarding  Extremities: 1+LE edema, No lymphangitis, No petechiae, No rashes, no synovitis  Data Reviewed: Basic Metabolic Panel:  Recent Labs Lab 01/22/15 0500 01/23/15 0301 01/24/15 0302 01/25/15 0550 01/26/15 0410  NA 135 130* 123* 118* 129*  K 4.2 4.1 4.4 4.3 3.6  CL 98* 95* 88* 84* 93*  CO2 26 28 26 24 26   GLUCOSE 175* 125* 143* 126* 203*  BUN 24* 31* 36* 39* 17  CREATININE 3.21* 4.11* 5.06* 5.54* 3.08*  CALCIUM 8.0* 7.9* 7.6* 7.6* 7.5*  MG 2.0  2.2 2.1 2.1 1.9  PHOS 3.4 4.3 4.9* 5.3* 3.7   Liver Function Tests:  Recent Labs Lab 01/22/15 0500 01/23/15 0301 01/24/15 0302 01/25/15 0550 01/26/15 0410  ALBUMIN 2.9* 2.5* 2.4* 2.5* 2.3*   No results for input(s): LIPASE, AMYLASE in the last 168 hours. No results for input(s): AMMONIA in the last 168 hours. CBC:  Recent Labs Lab 01/22/15 0500  WBC 9.0  HGB 7.8*  HCT 25.0*  MCV 94.7  PLT 152   Cardiac Enzymes: No results for input(s): CKTOTAL, CKMB, CKMBINDEX, TROPONINI in the last 168 hours. BNP: Invalid input(s): POCBNP CBG:  Recent Labs Lab 01/25/15 1110 01/25/15 1557 01/25/15 2045 01/26/15 0608 01/26/15 1203  GLUCAP 154* 141* 229* 193* 325*  Recent Results (from the past 240 hour(s))  Culture, blood (routine x 2)     Status: None   Collection Time: 01/18/15  4:40 PM  Result Value Ref Range Status   Specimen Description BLOOD RIGHT ARM  Final   Special Requests BOTTLES DRAWN AEROBIC ONLY 5CC  Final   Culture NO GROWTH 5 DAYS  Final   Report Status 01/23/2015 FINAL  Final  Culture, blood (routine x 2)     Status: None   Collection Time: 01/18/15  4:45 PM  Result Value Ref Range Status   Specimen Description BLOOD LEFT ARM  Final   Special Requests BOTTLES DRAWN AEROBIC ONLY 5CC  Final   Culture NO GROWTH 5 DAYS  Final   Report Status 01/23/2015 FINAL  Final  Urine culture     Status: None   Collection Time: 01/21/15  9:00 AM  Result Value Ref Range Status   Specimen Description URINE, CLEAN CATCH  Final   Special Requests NONE  Final   Culture NO GROWTH 1 DAY  Final   Report Status 01/22/2015 FINAL  Final     Scheduled Meds: . antiseptic oral rinse  7 mL Mouth Rinse BID  . aspirin  81 mg Oral Daily  . atorvastatin  10 mg Oral q1800  . calcium acetate  667 mg Oral TID WC  . carvedilol  12.5 mg Oral BID WC  . darbepoetin (ARANESP) injection - NON-DIALYSIS  100 mcg Subcutaneous Q Fri-1800  . dextromethorphan-guaiFENesin  1 tablet Oral BID    . feeding supplement (NEPRO CARB STEADY)  237 mL Oral Q24H  . heparin subcutaneous  5,000 Units Subcutaneous 3 times per day  . insulin aspart  0-5 Units Subcutaneous QHS  . insulin aspart  0-9 Units Subcutaneous TID WC  . insulin glargine  10 Units Subcutaneous Daily  . isosorbide-hydrALAZINE  1 tablet Oral TID  . latanoprost  1 drop Both Eyes QHS  . salmeterol  1 puff Inhalation BID   Continuous Infusions:    Lawyer Washabaugh, DO  [facility] Pager [phone]  If 7PM-7AM, please contact night-coverage [email or web address] Password [email or web address] 01/26/2015, 4:18 PM   LOS: 12 days

## 2015-01-26 NOTE — Progress Notes (Signed)
Right  Upper Extremity Vein Map    Cephalic  Segment Diameter Depth Comment  1. Axilla 3.14mm mm   2. Mid upper arm 39mm mm   3. Above AC 3.11mm mm   4. In [facility] 3.40mm mm   5. Below AC 3.58mm mm   6. Mid forearm 3.62mm mm   7. Wrist 2.27mm mm    mm mm    mm mm    mm mm    Basilic  Segment Diameter Depth Comment  1. Axilla 60mm 24mm   2. Mid upper arm 7mm 14mm   3. Above AC mm mm Bandages, iv  4. In [facility] 2.75mm 31mm branching  5. Below AC 1.41mm 2.79mm   6. Mid forearm 1.61mm 3.81mm   7. Wrist 15mm 2.31mm branching   mm mm    mm mm    mm mm    Left Upper Extremity Vein Map    Cephalic  Segment Diameter Depth Comment  1. Axilla 3.32mm mm   2. Mid upper arm 2.61mm mm   3. Above AC 2.62mm mm   4. In AC 58mm mm   5. Below AC 3.38mm mm branching  6. Mid forearm 40mm mm branching  7. Wrist 1.57mm mm    mm mm    mm mm    mm mm    Basilic  Segment Diameter Depth Comment  1. Axilla 84mm 69mm   2. Mid upper arm 2.37mm 65mm   3. Above AC 68mm 53mm branching  4. In AC 4.72mm 101mm   5. Below AC 50mm 67mm   6. Mid forearm 3.14mm 75mm   7. Wrist 2.21mm 47mm    mm mm    mm mm    mm mm

## 2015-01-26 NOTE — Progress Notes (Signed)
Patient ID: Pamela [name], female   DOB: May 24, 1950, 64 y.o.   MRN: [id number]  [facility] Progress Note   Assessment/ Plan:   1. 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 After discussions with the patient-started her on maintenance/chronic hemodialysis yesterday via tunneled dialysis catheter placed by IR. Seen by vascular surgery for permanent access planning-vein mapping pending. 2. Sepsis- culture data negative to date-completed empiric antibiotic course for management of sepsis 3. Anemia- low but stable-ongoing treatment for anemia of chronic kidney disease-ESA . No overt loss following biopsy.    4. Secondary hyperparathyroidism- last PTH 102 and not on vit D- started on low-dose phosphorus binder for hyperphosphatemia 5. HTN/volume- some volume overload- pressure is better- on norvasc and coreg- also determined to have poor EF 6. Hyponatremia: Due to impaired free water excretion with ESRD-improving with dialysis  Subjective:   Reports to be tired this morning-got back from dialysis at about 2 AM and did not sleep well since then..    Objective:   BP 129/45 mmHg  Pulse 105  Temp(Src) 98.1 F (36.7 C) (Oral)  Resp 16  Ht 5\' 1"  (1.549 m)  Wt 51.2 kg (112 lb 14 oz)  BMI 21.34 kg/m2  SpO2 100%  Intake/Output Summary (Last 24 hours) at 01/26/15 0904 Last data filed at 01/26/15 0847  Gross per 24 hour  Intake  738.2 ml  Output   2800 ml  Net -2061.8 ml   Weight change: -1.513 kg (-3 lb 5.4 oz)  Physical Exam: Gen: Appears tired resting on the edge of her bed-eating breakfast  OOI:LNZVJ regular in rate and rhythm, S1 and S2 normal  Resp:CTA with intermittent expiratory wheeze KQA:SUOR, flat, nontender Ext: No LE edema  Imaging: No results found.  Labs: BMET  Recent Labs Lab 01/20/15 0548 01/21/15 0347 01/22/15 0500  01/23/15 0301 01/24/15 0302 01/25/15 0550 01/26/15 0410  NA 136 128* 135 130* 123* 118* 129*  K 3.3* 3.6 4.2 4.1 4.4 4.3 3.6  CL 95* 92* 98* 95* 88* 84* 93*  CO2 29 28 26 28 26 24 26   GLUCOSE 173* 168* 175* 125* 143* 126* 203*  BUN 55* 56* 24* 31* 36* 39* 17  CREATININE 3.76* 4.46* 3.21* 4.11* 5.06* 5.54* 3.08*  CALCIUM 8.3* 7.9* 8.0* 7.9* 7.6* 7.6* 7.5*  PHOS 3.6 3.7 3.4 4.3 4.9* 5.3* 3.7   CBC  Recent Labs Lab 01/22/15 0500  WBC 9.0  HGB 7.8*  HCT 25.0*  MCV 94.7  PLT 152   Medications:    . antiseptic oral rinse  7 mL Mouth Rinse BID  . aspirin  81 mg Oral Daily  . atorvastatin  10 mg Oral q1800  . calcium acetate  667 mg Oral TID WC  . carvedilol  12.5 mg Oral BID WC  . darbepoetin (ARANESP) injection - NON-DIALYSIS  100 mcg Subcutaneous Q Fri-1800  . dextromethorphan-guaiFENesin  1 tablet Oral BID  . feeding supplement (NEPRO CARB STEADY)  237 mL Oral Q24H  . heparin subcutaneous  5,000 Units Subcutaneous 3 times per day  . insulin aspart  0-5 Units Subcutaneous QHS  . insulin aspart  0-9 Units Subcutaneous TID WC  . insulin glargine  10 Units Subcutaneous Daily  . isosorbide-hydrALAZINE  1 tablet Oral TID  . latanoprost  1 drop Both Eyes QHS   Zetta Bills, MD 01/26/2015, 9:04 AM

## 2015-01-26 NOTE — Progress Notes (Signed)
Physical Therapy Treatment Patient Details Name: Pamela [name] MRN: [id number] DOB: 1950-07-03 Today's Date: 01/26/2015    History of Present Illness Pt is a 64 y/o female with a PMH of stage III kidney disease who had a biopsy done 9/1 of L kidney. For 24 hours PTA pt continued to deteriorate from a mental status standpoint and had a LOC on 9/3 and was brought to the ED. In the ED pt was noted to be in renal failure, was hyperkalemic, acidotic, and in respiratory failure due to AMS. She was intubated 9/3-9/4, and reintubated 9/6, extubated on 9/8.    PT Comments    Pt able to increase ambulation distance today but requires encouragement to do so. No SOB, CP or difficulty with gait with HR 125-132 and sats 92% on RA. Pt reports fatigue from late HD session yesterday. Pt encouraged to continue increasing mobility and performing HEP, states understanding.   Follow Up Recommendations  Home health PT;Supervision for mobility/OOB     Equipment Recommendations       Recommendations for Other Services       Precautions / Restrictions Precautions Precautions: Fall    Mobility  Bed Mobility Overal bed mobility: Modified Independent                Transfers Overall transfer level: Needs assistance     Sit to Stand: Supervision         General transfer comment: cues for hand placement  Ambulation/Gait Ambulation/Gait assistance: Supervision Ambulation Distance (Feet): 200 Feet Assistive device: Rolling walker (2 wheeled) Gait Pattern/deviations: Step-through pattern;Decreased stride length   Gait velocity interpretation: Below normal speed for age/gender General Gait Details: cues for position in RW with very slow gait which pt reports as baseline. Pt with sats 92% on RA and HR 125-132 with gait   Stairs            Wheelchair Mobility    Modified Rankin (Stroke Patients Only)       Balance Overall balance assessment: Needs assistance   Sitting  balance-Leahy Scale: Good       Standing balance-Leahy Scale: Fair                      Cognition Arousal/Alertness: Awake/alert Behavior During Therapy: Flat affect Overall Cognitive Status: Within Functional Limits for tasks assessed                      Exercises General Exercises - Lower Extremity Long Arc Quad: Both;20 reps;AROM;Seated Hip ABduction/ADduction: AROM;Seated;Both;20 reps Hip Flexion/Marching: AROM;Seated;Both;20 reps    General Comments        Pertinent Vitals/Pain Pain Assessment: No/denies pain    Home Living                      Prior Function            PT Goals (current goals can now be found in the care plan section) Progress towards PT goals: Progressing toward goals    Frequency       PT Plan Current plan remains appropriate    Co-evaluation             End of Session   Activity Tolerance: Patient tolerated treatment well Patient left: in chair;with call bell/phone within reach;with family/visitor present;with nursing/sitter in room     Time: 1217-1231 PT Time Calculation (min) (ACUTE ONLY): 14 min  Charges:  $Gait Training: 8-22 mins  G CodesDelorse Lek 2015-01-30, 12:46 PM [name], PT [phone]

## 2015-01-27 LAB — RENAL FUNCTION PANEL
ANION GAP: 8 (ref 5–15)
Albumin: 2.4 g/dL — ABNORMAL LOW (ref 3.5–5.0)
BUN: 22 mg/dL — ABNORMAL HIGH (ref 6–20)
CALCIUM: 7.7 mg/dL — AB (ref 8.9–10.3)
CHLORIDE: 91 mmol/L — AB (ref 101–111)
CO2: 26 mmol/L (ref 22–32)
Creatinine, Ser: 4.33 mg/dL — ABNORMAL HIGH (ref 0.44–1.00)
GFR calc non Af Amer: 10 mL/min — ABNORMAL LOW (ref >60)
GFR, EST AFRICAN AMERICAN: 11 mL/min — AB (ref >60)
GLUCOSE: 203 mg/dL — AB (ref 65–99)
POTASSIUM: 3.9 mmol/L (ref 3.5–5.1)
Phosphorus: 3.7 mg/dL (ref 2.5–4.6)
SODIUM: 125 mmol/L — AB (ref 135–145)

## 2015-01-27 LAB — BASIC METABOLIC PANEL
ANION GAP: 9 (ref 5–15)
BUN: 22 mg/dL — ABNORMAL HIGH (ref 6–20)
CHLORIDE: 91 mmol/L — AB (ref 101–111)
CO2: 26 mmol/L (ref 22–32)
Calcium: 7.7 mg/dL — ABNORMAL LOW (ref 8.9–10.3)
Creatinine, Ser: 4.31 mg/dL — ABNORMAL HIGH (ref 0.44–1.00)
GFR calc non Af Amer: 10 mL/min — ABNORMAL LOW (ref >60)
GFR, EST AFRICAN AMERICAN: 12 mL/min — AB (ref >60)
Glucose, Bld: 203 mg/dL — ABNORMAL HIGH (ref 65–99)
POTASSIUM: 3.9 mmol/L (ref 3.5–5.1)
SODIUM: 126 mmol/L — AB (ref 135–145)

## 2015-01-27 LAB — GLUCOSE, CAPILLARY
GLUCOSE-CAPILLARY: 225 mg/dL — AB (ref 65–99)
Glucose-Capillary: 192 mg/dL — ABNORMAL HIGH (ref 65–99)
Glucose-Capillary: 117 mg/dL — ABNORMAL HIGH (ref 65–99)
Glucose-Capillary: 265 mg/dL — ABNORMAL HIGH (ref 65–99)

## 2015-01-27 LAB — MAGNESIUM: Magnesium: 2 mg/dL (ref 1.7–2.4)

## 2015-01-27 NOTE — Progress Notes (Signed)
Patient ID: Pamela [name], female   DOB: 1950-09-22, 64 y.o.   MRN: [id number]  Patient being dialyzed currently.  No changes from my perspective.  If she will be on long-term HD, would replace Bidil with ACEI.  We'll see again Monday if she remains in the hospital.  Marca Ancona 01/27/2015

## 2015-01-27 NOTE — Progress Notes (Signed)
   Daily Progress Note  Pt not in room when I came by earlier.  Vein mapping suggest access options might be: R BC AVF, marginal R BVT, marginal L BC AVF, and L BVT. Probably would start with L arm options.  Will discuss with patient on Monday and try to schedule for Tuesday if patient is willing.   Leonides Sake, MD [facility] Office: [phone] Pager: [phone]  01/27/2015, 11:57 AM

## 2015-01-27 NOTE — Progress Notes (Signed)
PROGRESS NOTE  Pamela [name] ZOX:[id number] DOB: 11-Oct-1950 DOA: 01/14/2015 PCP: Georgann Housekeeper, MD   Brief History pleasant 64 year old Caucasian female with history of hypertensive nephrosclerosis with CKD stage IV under the care of Dr. Kathrene Bongo, CAD with chronic combined diastolic and systolic heart failure EF around 30%, mild AS, essential hypertension, dyslipidemia who underwent an outpatient kidney biopsy on 01/12/2015 thereafter presented to the ER on 01/14/2015 for decreased mentation due to uremia. She was found to be in acute renal failure with hyperkalemia, developed respiratory failure due to combination of fluid overload along with failure to protect airway due to uremia. She was intubated and kept in ICU. There was concern of sepsis from possible urinary tract infection. Patient was started on antibiotics and ultimately finished her regimen. She was seen by cardiology and nephrology. Right femoral Vas-Cath was placed and she was started on dialysis this admission. She underwent TTE along with TEE both confirming severe combined systolic and diastolic CHF however no evidence of endocarditis.  She was stabilized and transferred to hospitalist service on day 7 of her hospital stay date 01/21/2015.  Unfortunately her renal function has continued to decline, on 01/24/2015 it was decided that she would likely require permanent dialysis. IR has been consulted and right IJ PermCath was placed. Vascular surgery was consulted for a permanent access. Plans for fistula are tentatively for 01/31/2015 Assessment/Plan: Acute hypoxic respiratory failure  -due to combination of toxic encephalopathy failure to protect airway, fluid overload.  -Requiring intubation and extubation 2, finally extubated on 01/20/2015. -Pulmonary status appears stable.  -Try to titrate off oxygen, supportive care with Neb treatments as needed. 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  -01/24/2015 it was decided that she would likely require permanent dialysis.  -01/25/15--PermCath placement and HD -VVS for fistula creation--tentatively planned for 9/20 -await CLIP process for outpt dialysis chair  Acute on chronic systolic and diastolic CHF /ischemic cardiomyopathy.  -EF 25-30%--- Reduced from 35-40% on echo 11/15 and cath 12/15.  -Underwent TTE and TEE -fluid removal via dialysis - Baseline weight appears to be 112-114 -negative 9 pounds since admission -Continue Bidil. Coreg increased to 12.5 bid 01/26/15. -ASA 81 mg daily -continue statin -defer to cardiology to add ACE/ARB now that pt is ESRD  Toxic/Metabolic encephalopathy due to uremia.  -Resolved   COPD.  -No wheezing, nebulizer treatments as needed -stable on RA -serevent restarted  Dyslipidemia. -Continue Lipitor  Left cephalic vein SVT  -noted on ultrasound done in ICU on 01/18/2015.  -Supportive care.  -Discussed with vascular surgery-->No further evaluation or treatment for SVT.   DM type II.  -01/21/2015 hemoglobin A1c 6.8 -Continue Lantus 10 units daily -Continue NovoLog sliding scale -CBGs increasing with increasing po intake -add novolog 4 units premeal -d/c amaryl -will not restart metformin after d/c  Hyponatremia -secondary to renal failure and fluid overload -improvement with HD  Questionable sepsis.  -No clear source, all cultures negative,  -UA was clear, chest x-ray was clear, one of her sputum cultures grew Enterobacter cloacae -in ICU was tapered down to Rocephin which she finished on 01/21/2015.  -stable repeat 2 view chest x-ray on 01/21/2015.   Family Communication: Husband updated at beside Disposition Plan: Home when medically  stable and dialysis chair obtained     Procedures/Studies: Dg Chest 2 View  01/21/2015   CLINICAL DATA:  Cough.  EXAM: CHEST  2 VIEW  COMPARISON:  January 19, 2015.  FINDINGS: The heart size and mediastinal contours are within normal limits. Both lungs are clear. No pneumothorax is noted. Minimal bilateral pleural effusions are noted. The visualized skeletal structures are unremarkable.  IMPRESSION: Minimal bilateral pleural effusions. No other significant abnormality seen in the chest.   Electronically Signed   By: Lupita Raider, M.D.   On: 01/21/2015 20:27   US Renal  01/14/2015   CLINICAL DATA:  Renal failure. History of hypertension, COPD, diabetes.  EXAM: RENAL / URINARY TRACT ULTRASOUND COMPLETE  COMPARISON:  10/21/2014  FINDINGS: Right Kidney:  Length: 9.9 cm. Renal parenchyma is echogenic. Multiple small cysts are identified. The largest is seen in the lower pole region measuring 1.1 cm. No hydronephrosis.  Left Kidney:  Length: 10.2 cm. Echogenic renal parenchyma. Multiple small cysts are identified, largest in the lower pole region which measures 1.3 cm.  Bladder:  A Foley catheter decompresses the bladder.  Additional:  Note is made of a small amount of ascites.  IMPRESSION: 1. Echogenic kidneys bilaterally.  Bilateral renal cysts. 2. No hydronephrosis. 3. Small amount of ascites noted.   Electronically Signed   By: Norva Pavlov M.D.   On: 01/14/2015 17:44   US Biopsy  01/12/2015   INDICATION: Proteinuria of uncertain etiology. Please perform ultrasound-guided biopsy for tissue diagnostic purposes.  EXAM: ULTRASOUND GUIDED RENAL BIOPSY  COMPARISON:  Renal ultrasound - 10/21/2014  MEDICATIONS: Fentanyl 50 mcg IV; Versed 1 mg IV  ANESTHESIA/SEDATION: Total Moderate Sedation time  15 minutes  COMPLICATIONS: SIR level A: No therapy, no consequence.  Procedure complicated by development of a tiny, asymptomatic, non enlarging left-sided perinephric hematoma.  PROCEDURE: Informed written  consent was obtained from the patient after a discussion of the risks, benefits and alternatives to treatment. The patient understands and consents the procedure. A timeout was performed prior to the initiation of the procedure.  Ultrasound scanning was performed of the bilateral flanks. The inferior pole of the left kidney was selected for biopsy due to location and sonographic window. The procedure was planned. The operative site was prepped and draped in the usual sterile fashion. The overlying soft tissues were anesthetized with 1% lidocaine with epinephrine. A 17 gauge core needle biopsy device was advanced into the inferior cortex of the left kidney and 3 core biopsies were obtained under direct ultrasound guidance. Real time pathologic review confirmed adequate tissue acquisition. Images were saved for documentation purposes. The biopsy device was removed and hemostasis was obtained with manual compression.  Postprocedural scanning and demonstrated development of a very tiny perinephric hematoma which was noted to not enlarged on the acquired delayed postprocedural imaging (representative images 8 and 9). Post procedural scanning was negative for significant post procedural hemorrhage or additional complication. A dressing was placed. The patient tolerated the procedure well without immediate post procedural complication.  IMPRESSION: Technically successful ultrasound guided left renal biopsy.   Electronically Signed   By: Simonne Come M.D.   On: 01/12/2015 13:57   Ir Fluoro Guide Cv Line Right  01/26/2015   CLINICAL DATA:  Renal disease  EXAM: TUNNELED DIALYSIS CATHETER PLACEMENT, ULTRASOUND GUIDANCE FOR VASCULAR ACCESS  FLUOROSCOPY TIME:  12 seconds  MEDICATIONS AND MEDICAL HISTORY: Versed 0.5 mg, Fentanyl 25 mcg.  As antibiotic prophylaxis, Ancef was ordered pre-procedure and administered intravenously within one hour of incision.  ANESTHESIA/SEDATION: Moderate sedation time: 15 minutes  CONTRAST:  None  PROCEDURE: The procedure, risks, benefits, and alternatives were explained to the patient. Questions regarding the procedure were encouraged and answered. The patient understands and consents to the procedure.  The right neck was prepped with Betadine in a sterile fashion, and a sterile drape was applied covering the operative field. A sterile gown and sterile gloves were used for the procedure. 1% lidocaine into the skin and subcutaneous tissue. The right internal jugular vein was noted to be patent initially with ultrasound. Under sonographic guidance, a micropuncture needle was inserted into the right IJ vein (Ultrasound and fluoroscopic image documentation was performed). It was removed over an 018 wire which was upsized to an Amplatz. This was advanced into the IVC.  A small incision was made in the right upper chest. The tunneling device was utilized to advance the 23 centimeter tip to cuff catheter from the chest incision and out the neck incision. A peel-away sheath was advanced over the Amplatz wire. The leading edge of the catheter was then advanced through the peel-away sheath. The peel-away sheath was removed. It was flushed and instilled with heparin. The chest incision was closed with a 0 Prolene pursestring stitch. The neck incision was closed with a 4-0 Vicryl subcuticular stitch.  COMPLICATIONS: None  FINDINGS: The image demonstrates placement of a tunneled dialysis catheter with its tip in the right atrium.  IMPRESSION: Successful right IJ vein tunneled dialysis catheter with its tip in the right atrium.   Electronically Signed   By: Jolaine Click M.D.   On: 01/26/2015 12:45   Ir US Guide Vasc Access Right  01/26/2015   CLINICAL DATA:  Renal disease  EXAM: TUNNELED DIALYSIS CATHETER PLACEMENT, ULTRASOUND GUIDANCE FOR VASCULAR ACCESS  FLUOROSCOPY TIME:  12 seconds  MEDICATIONS AND MEDICAL HISTORY: Versed 0.5 mg, Fentanyl 25 mcg.  As antibiotic prophylaxis, Ancef was ordered pre-procedure and  administered intravenously within one hour of incision.  ANESTHESIA/SEDATION: Moderate sedation time: 15 minutes  CONTRAST:  None  PROCEDURE: The procedure, risks, benefits, and alternatives were explained to the patient. Questions regarding the procedure were encouraged and answered. The patient understands and consents to the procedure.  The right neck was prepped with Betadine in a sterile fashion, and a sterile drape was applied covering the operative field. A sterile gown and sterile gloves were used for the procedure. 1% lidocaine into the skin and subcutaneous tissue. The right internal jugular vein was noted to be patent initially with ultrasound. Under sonographic guidance, a micropuncture needle was inserted into the right IJ vein (Ultrasound and fluoroscopic image documentation was performed). It was removed over an 018 wire which was upsized to an Amplatz. This was advanced into the IVC.  A small incision was made in the right upper chest. The tunneling device was utilized to advance the 23 centimeter tip to cuff catheter from the chest incision and out the neck incision. A peel-away sheath was advanced over the Amplatz wire. The leading edge of the catheter was then advanced through the peel-away sheath. The peel-away sheath was removed. It was flushed and instilled with heparin. The chest incision was closed with a 0 Prolene pursestring stitch. The neck incision was closed with a 4-0 Vicryl subcuticular stitch.  COMPLICATIONS: None  FINDINGS: The image demonstrates placement of a tunneled dialysis catheter with its tip in the right atrium.  IMPRESSION: Successful right IJ vein tunneled dialysis catheter with its tip in the right atrium.   Electronically Signed   By: Jolaine Click M.D.   On:  01/26/2015 12:45   Dg Chest Port 1 View  01/23/2015   CLINICAL DATA:  Shortness of breath  EXAM: PORTABLE CHEST - 1 VIEW  COMPARISON:  January 21, 2015  FINDINGS: The heart size and mediastinal contours are  within normal limits. There is no focal pneumonia or pulmonary edema. Minimal right pleural effusion is identified. The visualized skeletal structures are stable.  IMPRESSION: Minimal right pleural effusion.  No focal pneumonia.   Electronically Signed   By: Sherian Rein M.D.   On: 01/23/2015 09:40   Dg Chest Port 1 View  01/19/2015   CLINICAL DATA:  Renal failure.  EXAM: PORTABLE CHEST - 1 VIEW  COMPARISON:  01/17/2015.  FINDINGS: Endotracheal tube, left IJ line, NG tube in stable position. Heart size is stable. Bibasilar pulmonary infiltrates consistent pneumonia. Minimal interval improvement. No pleural effusion or pneumothorax.  IMPRESSION: 1. Lines and tubes in stable position. 2. Bibasilar infiltrates consistent with pneumonia. Minimal interval improvement.   Electronically Signed   By: Maisie Fus  Register   On: 01/19/2015 07:21   Dg Chest Port 1 View  01/17/2015   CLINICAL DATA:  Ventilator dependent respiratory failure. Followup left basilar atelectasis.  EXAM: PORTABLE CHEST - 1 VIEW  COMPARISON:  01/15/2015 and earlier.  FINDINGS: Endotracheal tube tip in satisfactory position approximately 4 cm above the carina. Nasogastric tube courses below the diaphragm into the stomach though its tip is not included on the image. Left jugular central venous catheter tip projects over the mid to lower SVC.  Cardiac silhouette normal in size, unchanged. Airspace opacities involving the lung bases bilaterally, right greater than left, progressive since the examination 2 days ago. Pulmonary vascularity normal. Possible small bilateral pleural effusions.  IMPRESSION: 1. Support apparatus satisfactory. 2. Pneumonia involving the lung bases, right greater than left, progressive since the examination 2 days ago. 3. Possible small bilateral pleural effusions.   Electronically Signed   By: Hulan Saas M.D.   On: 01/17/2015 11:28   Dg Chest Port 1 View  01/15/2015   CLINICAL DATA:  Septic shock, respiratory failure and  acute renal failure. Question left lower lobe pneumonia.  EXAM: PORTABLE CHEST - 1 VIEW  COMPARISON:  01/14/2015  FINDINGS: Endotracheal tube with tip 3.2 cm above the carina, left IJ central venous catheter with tip overlying the superior cavoatrial junction NG tube entering the stomach with tip off the field of view noted.  Left lower lung opacity has improved.  There is no evidence of pleural effusion or pneumothorax.  IMPRESSION: Improved left lower lung opacity, suggesting atelectasis.  No other significant change.   Electronically Signed   By: Harmon Pier M.D.   On: 01/15/2015 07:32   Dg Chest Port 1 View  01/14/2015   CLINICAL DATA:  Septic shock, respiratory failure and acute renal failure. Endotracheal tube and central line placement.  EXAM: PORTABLE CHEST - 1 VIEW  COMPARISON:  01/14/2015 and prior radiographs  FINDINGS: The cardiomediastinal silhouette is unchanged.  An endotracheal tube with tip 5.1 cm above the carina, left IJ central venous catheter with tip overlying the lower SVC and NG tube entering the stomach with tip off the field of view are noted.  Left lower lung airspace disease is present.  There is no evidence of pneumothorax or pleural effusion.  IMPRESSION: Left lower lung airspace disease/ pneumonia.  Support apparatus as described.  No evidence of pneumothorax.   Electronically Signed   By: Harmon Pier M.D.   On: 01/14/2015 16:38  Dg Chest Portable 1 View  01/14/2015   CLINICAL DATA:  Sepsis.  EXAM: PORTABLE CHEST - 1 VIEW  COMPARISON:  03/25/2014.  FINDINGS: Interval borderline enlargement of the cardiac silhouette and mild increase in prominence of the interstitial markings, accentuated by a decreased inspiration. Calcified granuloma in the right lower lung zone. No pleural fluid. Diffuse osteopenia.  IMPRESSION: Interval borderline cardiomegaly and mildly increased prominence of the interstitial markings, most likely due to a poor inspiration. No gross acute abnormality.    Electronically Signed   By: Beckie Salts M.D.   On: 01/14/2015 15:21   Dg Abd Portable 1v  01/17/2015   CLINICAL DATA:  Nasogastric tube placement  EXAM: PORTABLE ABDOMEN - 1 VIEW  COMPARISON:  None.  FINDINGS: Orogastric tube with the tip projecting over the antrum of the stomach. There is no bowel dilatation to suggest obstruction. There is no evidence of pneumoperitoneum, portal venous gas or pneumatosis. There are no pathologic calcifications along the expected course of the ureters.The osseous structures are unremarkable.  IMPRESSION: Orogastric tube with the tip projecting over the antrum of the stomach.   Electronically Signed   By: Elige Ko   On: 01/17/2015 11:19         Subjective: Feels tired after dialysis today. Otherwise denies any chest discomfort, shortness breath, nausea, vomiting, diarrhea, abdominal pain. Denies any fevers or chills. Overall breathing much better.  Objective: Filed Vitals:   01/27/15 0930 01/27/15 1000 01/27/15 1023 01/27/15 1106  BP: 141/72 133/65 143/66 131/53  Pulse: 96 98 94 99  Temp:   98.1 F (36.7 C) 98.5 F (36.9 C)  TempSrc:   Oral Oral  Resp:   16   Height:      Weight:   54.4 kg (119 lb 14.9 oz)   SpO2:   94% 96%    Intake/Output Summary (Last 24 hours) at 01/27/15 1640 Last data filed at 01/27/15 1515  Gross per 24 hour  Intake    775 ml  Output   2539 ml  Net  -1764 ml   Weight change: -1.3 kg (-2 lb 13.9 oz) Exam:   General:  Pt is alert, follows commands appropriately, not in acute distress  HEENT: No icterus, No thrush, No neck mass, [place]/AT  Cardiovascular: RRR, S1/S2, no rubs, no gallops  Respiratory: Diminished breath sounds at the bases. No wheezing  Abdomen: Soft/+BS, non tender, non distended, no guarding  Extremities: trace LEedema, No lymphangitis, No petechiae, No rashes, no synovitis  Data Reviewed: Basic Metabolic Panel:  Recent Labs Lab 01/23/15 0301 01/24/15 0302 01/25/15 0550 01/26/15 0410  01/27/15 0448  NA 130* 123* 118* 129* 126*  125*  K 4.1 4.4 4.3 3.6 3.9  3.9  CL 95* 88* 84* 93* 91*  91*  CO2 28 26 24 26 26  26   GLUCOSE 125* 143* 126* 203* 203*  203*  BUN 31* 36* 39* 17 22*  22*  CREATININE 4.11* 5.06* 5.54* 3.08* 4.31*  4.33*  CALCIUM 7.9* 7.6* 7.6* 7.5* 7.7*  7.7*  MG 2.2 2.1 2.1 1.9 2.0  PHOS 4.3 4.9* 5.3* 3.7 3.7   Liver Function Tests:  Recent Labs Lab 01/23/15 0301 01/24/15 0302 01/25/15 0550 01/26/15 0410 01/27/15 0448  ALBUMIN 2.5* 2.4* 2.5* 2.3* 2.4*   No results for input(s): LIPASE, AMYLASE in the last 168 hours. No results for input(s): AMMONIA in the last 168 hours. CBC:  Recent Labs Lab 01/22/15 0500  WBC 9.0  HGB 7.8*  HCT 25.0*  MCV 94.7  PLT 152   Cardiac Enzymes: No results for input(s): CKTOTAL, CKMB, CKMBINDEX, TROPONINI in the last 168 hours. BNP: Invalid input(s): POCBNP CBG:  Recent Labs Lab 01/26/15 1657 01/26/15 2059 01/27/15 0624 01/27/15 1101 01/27/15 1618  GLUCAP 205* 251* 192* 117* 265*    Recent Results (from the past 240 hour(s))  Culture, blood (routine x 2)     Status: None   Collection Time: 01/18/15  4:40 PM  Result Value Ref Range Status   Specimen Description BLOOD RIGHT ARM  Final   Special Requests BOTTLES DRAWN AEROBIC ONLY 5CC  Final   Culture NO GROWTH 5 DAYS  Final   Report Status 01/23/2015 FINAL  Final  Culture, blood (routine x 2)     Status: None   Collection Time: 01/18/15  4:45 PM  Result Value Ref Range Status   Specimen Description BLOOD LEFT ARM  Final   Special Requests BOTTLES DRAWN AEROBIC ONLY 5CC  Final   Culture NO GROWTH 5 DAYS  Final   Report Status 01/23/2015 FINAL  Final  Urine culture     Status: None   Collection Time: 01/21/15  9:00 AM  Result Value Ref Range Status   Specimen Description URINE, CLEAN CATCH  Final   Special Requests NONE  Final   Culture NO GROWTH 1 DAY  Final   Report Status 01/22/2015 FINAL  Final     Scheduled Meds: .  antiseptic oral rinse  7 mL Mouth Rinse BID  . aspirin  81 mg Oral Daily  . atorvastatin  10 mg Oral q1800  . calcium acetate  667 mg Oral TID WC  . carvedilol  12.5 mg Oral BID WC  . darbepoetin (ARANESP) injection - NON-DIALYSIS  100 mcg Subcutaneous Q Fri-1800  . dextromethorphan-guaiFENesin  1 tablet Oral BID  . feeding supplement (NEPRO CARB STEADY)  237 mL Oral Q24H  . heparin subcutaneous  5,000 Units Subcutaneous 3 times per day  . insulin aspart  0-5 Units Subcutaneous QHS  . insulin aspart  0-9 Units Subcutaneous TID WC  . insulin aspart  4 Units Subcutaneous TID WC  . insulin glargine  10 Units Subcutaneous Daily  . isosorbide-hydrALAZINE  1 tablet Oral TID  . latanoprost  1 drop Both Eyes QHS  . salmeterol  1 puff Inhalation BID   Continuous Infusions:    Pamela [name], DAVID, DO  [facility] Pager [phone]  If 7PM-7AM, please contact night-coverage [email or web address] Password [email or web address] 01/27/2015, 4:40 PM   LOS: 13 days

## 2015-01-27 NOTE — Progress Notes (Signed)
Patient ID: Pamela [name], female   DOB: [birth year], 64 y.o.   MRN: [id number]  [facility] Progress Note   Assessment/ Plan:   1. 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 Continue MWF HD at this time- awaiting plans for permanent access per vascular surgery- Possible left BVT v/s BCF. Process underway for OP HD placement [facility]) 2. Sepsis- culture data negative to date-completed empiric antibiotic course for management of sepsis 3. Anemia- low but stable-ongoing treatment for anemia of chronic kidney disease-ESA . 4. Secondary hyperparathyroidism- last PTH 102 and not on vit D- started on low-dose phosphorus binder for hyperphosphatemia 5. HTN/volume- BP fairly controlled on current meds--agree with ACE-I and UF at HD 6. Hyponatremia: Due to impaired free water excretion with ESRD-improving with dialysis- continue fluid restriction to 1287mL/day  Subjective:   Reports to be feeling better and has questions about HD access.    Objective:   BP 141/72 mmHg  Pulse 96  Temp(Src) 98.4 F (36.9 C) (Oral)  Resp 18  Ht  (1.549 m)  Wt 56.7 kg (125 lb)  BMI 23.63 kg/m2  SpO2 95%  Intake/Output Summary (Last 24 hours) at 01/27/15 0933 Last data filed at 01/27/15 0600  Gross per 24 hour  Intake    955 ml  Output    750 ml  Net    205 ml   Weight change: -1.3 kg (-2 lb 13.9 oz)  Physical Exam: Gen: Appears comfortable on HD  FAO:ZHYQM regular in rate and rhythm, S1 and S2 normal  Resp:CTA bilaterally (anterior chest)- no rales/rhonchi VHQ:IONG, flat, nontender Ext: No LE edema. RIJ TDC  Imaging: Ir Fluoro Guide Cv Line Right  01/26/2015   CLINICAL DATA:  Renal disease  EXAM: TUNNELED DIALYSIS CATHETER PLACEMENT, ULTRASOUND GUIDANCE FOR VASCULAR ACCESS  FLUOROSCOPY TIME:  12 seconds  MEDICATIONS AND MEDICAL HISTORY: Versed 0.5 mg,  Fentanyl 25 mcg.  As antibiotic prophylaxis, Ancef was ordered pre-procedure and administered intravenously within one hour of incision.  ANESTHESIA/SEDATION: Moderate sedation time: 15 minutes  CONTRAST:  None  PROCEDURE: The procedure, risks, benefits, and alternatives were explained to the patient. Questions regarding the procedure were encouraged and answered. The patient understands and consents to the procedure.  The right neck was prepped with Betadine in a sterile fashion, and a sterile drape was applied covering the operative field. A sterile gown and sterile gloves were used for the procedure. 1% lidocaine into the skin and subcutaneous tissue. The right internal jugular vein was noted to be patent initially with ultrasound. Under sonographic guidance, a micropuncture needle was inserted into the right IJ vein (Ultrasound and fluoroscopic image documentation was performed). It was removed over an 018 wire which was upsized to an Amplatz. This was advanced into the IVC.  A small incision was made in the right upper chest. The tunneling device was utilized to advance the 23 centimeter tip to cuff catheter from the chest incision and out the neck incision. A peel-away sheath was advanced over the Amplatz wire. The leading edge of the catheter was then advanced through the peel-away sheath. The peel-away sheath was removed. It was flushed and instilled with heparin. The chest incision was closed with a 0 Prolene pursestring stitch. The neck incision was closed with a 4-0 Vicryl subcuticular stitch.  COMPLICATIONS: None  FINDINGS: The image demonstrates placement of a tunneled dialysis catheter with its tip in the right atrium.  IMPRESSION:  Successful right IJ vein tunneled dialysis catheter with its tip in the right atrium.   Electronically Signed   By: Jolaine Click M.D.   On: 01/26/2015 12:45   Ir US Guide Vasc Access Right  01/26/2015   CLINICAL DATA:  Renal disease  EXAM: TUNNELED DIALYSIS CATHETER  PLACEMENT, ULTRASOUND GUIDANCE FOR VASCULAR ACCESS  FLUOROSCOPY TIME:  12 seconds  MEDICATIONS AND MEDICAL HISTORY: Versed 0.5 mg, Fentanyl 25 mcg.  As antibiotic prophylaxis, Ancef was ordered pre-procedure and administered intravenously within one hour of incision.  ANESTHESIA/SEDATION: Moderate sedation time: 15 minutes  CONTRAST:  None  PROCEDURE: The procedure, risks, benefits, and alternatives were explained to the patient. Questions regarding the procedure were encouraged and answered. The patient understands and consents to the procedure.  The right neck was prepped with Betadine in a sterile fashion, and a sterile drape was applied covering the operative field. A sterile gown and sterile gloves were used for the procedure. 1% lidocaine into the skin and subcutaneous tissue. The right internal jugular vein was noted to be patent initially with ultrasound. Under sonographic guidance, a micropuncture needle was inserted into the right IJ vein (Ultrasound and fluoroscopic image documentation was performed). It was removed over an 018 wire which was upsized to an Amplatz. This was advanced into the IVC.  A small incision was made in the right upper chest. The tunneling device was utilized to advance the 23 centimeter tip to cuff catheter from the chest incision and out the neck incision. A peel-away sheath was advanced over the Amplatz wire. The leading edge of the catheter was then advanced through the peel-away sheath. The peel-away sheath was removed. It was flushed and instilled with heparin. The chest incision was closed with a 0 Prolene pursestring stitch. The neck incision was closed with a 4-0 Vicryl subcuticular stitch.  COMPLICATIONS: None  FINDINGS: The image demonstrates placement of a tunneled dialysis catheter with its tip in the right atrium.  IMPRESSION: Successful right IJ vein tunneled dialysis catheter with its tip in the right atrium.   Electronically Signed   By: Jolaine Click M.D.   On:  01/26/2015 12:45    Labs: BMET  Recent Labs Lab 01/21/15 0347 01/22/15 0500 01/23/15 0301 01/24/15 0302 01/25/15 0550 01/26/15 0410 01/27/15 0448  NA 128* 135 130* 123* 118* 129* 126*  125*  K 3.6 4.2 4.1 4.4 4.3 3.6 3.9  3.9  CL 92* 98* 95* 88* 84* 93* 91*  91*  CO2 28 26 28 26 24 26 26  26   GLUCOSE 168* 175* 125* 143* 126* 203* 203*  203*  BUN 56* 24* 31* 36* 39* 17 22*  22*  CREATININE 4.46* 3.21* 4.11* 5.06* 5.54* 3.08* 4.31*  4.33*  CALCIUM 7.9* 8.0* 7.9* 7.6* 7.6* 7.5* 7.7*  7.7*  PHOS 3.7 3.4 4.3 4.9* 5.3* 3.7 3.7   CBC  Recent Labs Lab 01/22/15 0500  WBC 9.0  HGB 7.8*  HCT 25.0*  MCV 94.7  PLT 152   Medications:    . antiseptic oral rinse  7 mL Mouth Rinse BID  . aspirin  81 mg Oral Daily  . atorvastatin  10 mg Oral q1800  . calcium acetate  667 mg Oral TID WC  . carvedilol  12.5 mg Oral BID WC  . darbepoetin (ARANESP) injection - NON-DIALYSIS  100 mcg Subcutaneous Q Fri-1800  . dextromethorphan-guaiFENesin  1 tablet Oral BID  . feeding supplement (NEPRO CARB STEADY)  237 mL Oral Q24H  . heparin  subcutaneous  5,000 Units Subcutaneous 3 times per day  . insulin aspart  0-5 Units Subcutaneous QHS  . insulin aspart  0-9 Units Subcutaneous TID WC  . insulin aspart  4 Units Subcutaneous TID WC  . insulin glargine  10 Units Subcutaneous Daily  . isosorbide-hydrALAZINE  1 tablet Oral TID  . latanoprost  1 drop Both Eyes QHS  . salmeterol  1 puff Inhalation BID   Zetta Bills, MD 01/27/2015, 9:33 AM

## 2015-01-27 NOTE — Procedures (Signed)
Patient seen on Hemodialysis. QB 350, UF goal 2.7L Treatment adjusted as needed.  Zetta Bills MD [facility]. Office # [phone] Pager # [phone] 9:30 AM

## 2015-01-28 DIAGNOSIS — J430 Unilateral pulmonary emphysema [MacLeod's syndrome]: Secondary | ICD-10-CM

## 2015-01-28 LAB — CBC
HEMATOCRIT: 24.6 % — AB (ref 36.0–46.0)
Hemoglobin: 7.9 g/dL — ABNORMAL LOW (ref 12.0–15.0)
MCH: 30.7 pg (ref 26.0–34.0)
MCHC: 32.1 g/dL (ref 30.0–36.0)
MCV: 95.7 fL (ref 78.0–100.0)
Platelets: 207 10*3/uL (ref 150–400)
RBC: 2.57 MIL/uL — ABNORMAL LOW (ref 3.87–5.11)
RDW: 18.4 % — AB (ref 11.5–15.5)
WBC: 9.8 10*3/uL (ref 4.0–10.5)

## 2015-01-28 LAB — RENAL FUNCTION PANEL
ALBUMIN: 2.4 g/dL — AB (ref 3.5–5.0)
Anion gap: 7 (ref 5–15)
BUN: 15 mg/dL (ref 6–20)
CALCIUM: 7.5 mg/dL — AB (ref 8.9–10.3)
CO2: 26 mmol/L (ref 22–32)
CREATININE: 2.7 mg/dL — AB (ref 0.44–1.00)
Chloride: 93 mmol/L — ABNORMAL LOW (ref 101–111)
GFR, EST AFRICAN AMERICAN: 20 mL/min — AB (ref >60)
GFR, EST NON AFRICAN AMERICAN: 18 mL/min — AB (ref >60)
Glucose, Bld: 181 mg/dL — ABNORMAL HIGH (ref 65–99)
PHOSPHORUS: 3.2 mg/dL (ref 2.5–4.6)
Potassium: 4.1 mmol/L (ref 3.5–5.1)
SODIUM: 126 mmol/L — AB (ref 135–145)

## 2015-01-28 LAB — GLUCOSE, CAPILLARY
GLUCOSE-CAPILLARY: 166 mg/dL — AB (ref 65–99)
GLUCOSE-CAPILLARY: 152 mg/dL — AB (ref 65–99)
Glucose-Capillary: 155 mg/dL — ABNORMAL HIGH (ref 65–99)
Glucose-Capillary: 147 mg/dL — ABNORMAL HIGH (ref 65–99)

## 2015-01-28 LAB — MAGNESIUM: Magnesium: 1.9 mg/dL (ref 1.7–2.4)

## 2015-01-28 MED ORDER — LOSARTAN POTASSIUM 50 MG PO TABS
50.0000 mg | ORAL_TABLET | Freq: Every day | ORAL | Status: DC
  Administered 2015-01-28 – 2015-01-31 (×5): 50 mg via ORAL
  Filled 2015-01-28 (×5): qty 1

## 2015-01-28 NOTE — Progress Notes (Signed)
Patient ID: Pamela [name], female   DOB: 1951/05/12, 64 y.o.   MRN: [id number]  [facility] Progress Note   Assessment/ Plan:   1. 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 Started on chronic/maintenance hemodialysis earlier in the week after failure to see sufficient renal recovery/urine output/clearance. She is currently on a Monday/Wednesday/Friday dialysis schedule with her last dialysis treatment being yesterday. From Dr. Nicky Pugh note-likely to be a candidate for left BCF versus BVT (possibly surgery Tuesday) 2. Sepsis- culture data negative to date-completed empiric antibiotic course for management of sepsis 3. Anemia- on ESA with low hemoglobin-will recheck CBC today to decide on possible needs for PRBCs. 4. Secondary hyperparathyroidism- last PTH 102 and not on vit D, phosphorus levels well controlled on low-dose binder 5. HTN/volume- BP fairly controlled on current meds--agree with switching to ARB from BiDil (prefer ARB over ACE inhibitor given her pulmonary history) 6. Hyponatremia: Due to impaired free water excretion with ESRD-improving with dialysis- continue fluid restriction to 1213mL/day  Subjective:   Complaints of a headache this morning because she slept poorly last night-overall, feeling better with dialysis    Objective:   BP 133/61 mmHg  Pulse 88  Temp(Src) 98.6 F (37 C) (Oral)  Resp 18  Ht 5\' 1"  (1.549 m)  Wt 56.926 kg (125 lb 8 oz)  BMI 23.73 kg/m2  SpO2 98%  Intake/Output Summary (Last 24 hours) at 01/28/15 0901 Last data filed at 01/28/15 0600  Gross per 24 hour  Intake    937 ml  Output   2389 ml  Net  -1452 ml   Weight change: 2.6 kg (5 lb 11.7 oz)  Physical Exam: Gen: Appears comfortable sitting up in bed eating breakfast-husband at bedside  XNT:ZGYFV regular in rate and rhythm, S1 and S2 normal  Resp:CTA  bilaterally (anterior chest)- no rales/rhonchi CBS:WHQP, flat, nontender Ext: No LE edema. RIJ TDC  Imaging: No results found.  Labs: BMET  Recent Labs Lab 01/22/15 0500 01/23/15 0301 01/24/15 0302 01/25/15 0550 01/26/15 0410 01/27/15 0448 01/28/15 0514  NA 135 130* 123* 118* 129* 126*  125* 126*  K 4.2 4.1 4.4 4.3 3.6 3.9  3.9 4.1  CL 98* 95* 88* 84* 93* 91*  91* 93*  CO2 26 28 26 24 26 26  26 26   GLUCOSE 175* 125* 143* 126* 203* 203*  203* 181*  BUN 24* 31* 36* 39* 17 22*  22* 15  CREATININE 3.21* 4.11* 5.06* 5.54* 3.08* 4.31*  4.33* 2.70*  CALCIUM 8.0* 7.9* 7.6* 7.6* 7.5* 7.7*  7.7* 7.5*  PHOS 3.4 4.3 4.9* 5.3* 3.7 3.7 3.2   CBC  Recent Labs Lab 01/22/15 0500  WBC 9.0  HGB 7.8*  HCT 25.0*  MCV 94.7  PLT 152   Medications:    . antiseptic oral rinse  7 mL Mouth Rinse BID  . aspirin  81 mg Oral Daily  . atorvastatin  10 mg Oral q1800  . calcium acetate  667 mg Oral TID WC  . carvedilol  12.5 mg Oral BID WC  . darbepoetin (ARANESP) injection - NON-DIALYSIS  100 mcg Subcutaneous Q Fri-1800  . dextromethorphan-guaiFENesin  1 tablet Oral BID  . feeding supplement (NEPRO CARB STEADY)  237 mL Oral Q24H  . heparin subcutaneous  5,000 Units Subcutaneous 3 times per day  . insulin aspart  0-5 Units Subcutaneous QHS  . insulin aspart  0-9 Units Subcutaneous TID WC  .  insulin aspart  4 Units Subcutaneous TID WC  . insulin glargine  10 Units Subcutaneous Daily  . isosorbide-hydrALAZINE  1 tablet Oral TID  . latanoprost  1 drop Both Eyes QHS  . salmeterol  1 puff Inhalation BID   Zetta Bills, MD 01/28/2015, 9:01 AM

## 2015-01-28 NOTE — Progress Notes (Signed)
Patient ambulated with front wheeled walker 80 ft in hallway. Pulse oximetry monitored while ambulating. Patient sats between 95%-100% without O2 during ambulation. Pulse remained between 98-109 bpm. Patient returned to room and is resting comfortably. Call bell within reach and husband at beside.

## 2015-01-28 NOTE — Progress Notes (Signed)
PROGRESS NOTE  Pamela [name] ZOX:[id number] DOB: May 07, 1951 DOA: 01/14/2015 PCP: Georgann Housekeeper, MD  Brief History pleasant 64 year old Caucasian female with history of hypertensive nephrosclerosis with CKD stage IV under the care of Dr. Kathrene Bongo, CAD with chronic combined diastolic and systolic heart failure EF around 30%, mild AS, essential hypertension, dyslipidemia who underwent an outpatient kidney biopsy on 01/12/2015 thereafter presented to the ER on 01/14/2015 for decreased mentation due to uremia. She was found to be in acute renal failure with hyperkalemia, developed respiratory failure due to combination of fluid overload along with failure to protect airway due to uremia. She was intubated and kept in ICU. There was concern of sepsis from possible urinary tract infection. Patient was started on antibiotics and ultimately finished her regimen. She was seen by cardiology and nephrology. Right femoral Vas-Cath was placed and she was started on dialysis this admission. She underwent TTE along with TEE both confirming severe combined systolic and diastolic CHF however no evidence of endocarditis.  She was stabilized and transferred to hospitalist service on day 7 of her hospital stay date 01/21/2015.  Unfortunately her renal function has continued to decline, on 01/24/2015 it was decided that she would likely require permanent dialysis. IR has been consulted and right IJ PermCath was placed. Vascular surgery was consulted for a permanent access. Plans for fistula are tentatively for 01/31/2015 Assessment/Plan: Acute hypoxic respiratory failure  -due to combination of toxic encephalopathy failure to protect airway, fluid overload.  -Requiring intubation and extubation 2, finally extubated on 01/20/2015. -Pulmonary status appears stable.  -Try to titrate off oxygen, supportive care with Neb treatments as needed. 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  -01/24/2015 it was decided that she would likely require permanent dialysis.  -01/25/15--PermCath placement and HD -VVS for fistula creation--tentatively planned for 01/31/15 -await CLIP process for outpt dialysis chair  Acute on chronic systolic and diastolic CHF /ischemic cardiomyopathy.  -EF 25-30%--- Reduced from 35-40% on echo 11/15 and cath 12/15.  -Underwent TTE and TEE -fluid removal via dialysis - Baseline weight appears to be 112-114 -negative 9 pounds since admission, but question today's weight??gained 5 pounds since 9/16 -Continue Coreg increased to 12.5 bid 01/26/15. -ASA 81 mg daily -continue statin -losartan started; stopped Bi-Dil  Toxic/Metabolic encephalopathy due to uremia.  -Resolved   COPD.  -No wheezing, nebulizer treatments as needed -stable on RA -serevent restarted   Dyslipidemia. -Continue Lipitor  Left cephalic vein SVT  -noted on ultrasound done in ICU on 01/18/2015.  -Supportive care.  -Discussed with vascular surgery-->No further evaluation or treatment for SVT.   DM type II.  -01/21/2015 hemoglobin A1c 6.8 -Continue Lantus 10 units daily -Continue NovoLog sliding scale -CBGs increasing with increasing po intake -add novolog 4 units premeal -d/c amaryl -will not restart metformin after d/c  Hyponatremia -secondary to renal failure and fluid overload -improvement with HD  Questionable sepsis.  -No clear source, all cultures negative,  -UA was clear, chest x-ray was clear, one of her sputum cultures grew Enterobacter cloacae -in ICU was tapered down to Rocephin which she finished on 01/21/2015.  -stable repeat 2 view chest x-ray on 01/21/2015.   Family Communication: Husband updated at beside Disposition Plan: Home when medically  stable and dialysis chair obtained     Procedures/Studies: Dg Chest 2 View  01/21/2015   CLINICAL DATA:  Cough.  EXAM: CHEST  2 VIEW  COMPARISON:  January 19, 2015.  FINDINGS: The heart size and mediastinal contours are within normal limits. Both lungs are clear. No pneumothorax is noted. Minimal bilateral pleural effusions are noted. The visualized skeletal structures are unremarkable.  IMPRESSION: Minimal bilateral pleural effusions. No other significant abnormality seen in the chest.   Electronically Signed   By: Lupita Raider, M.D.   On: 01/21/2015 20:27   US Renal  01/14/2015   CLINICAL DATA:  Renal failure. History of hypertension, COPD, diabetes.  EXAM: RENAL / URINARY TRACT ULTRASOUND COMPLETE  COMPARISON:  10/21/2014  FINDINGS: Right Kidney:  Length: 9.9 cm. Renal parenchyma is echogenic. Multiple small cysts are identified. The largest is seen in the lower pole region measuring 1.1 cm. No hydronephrosis.  Left Kidney:  Length: 10.2 cm. Echogenic renal parenchyma. Multiple small cysts are identified, largest in the lower pole region which measures 1.3 cm.  Bladder:  A Foley catheter decompresses the bladder.  Additional:  Note is made of a small amount of ascites.  IMPRESSION: 1. Echogenic kidneys bilaterally.  Bilateral renal cysts. 2. No hydronephrosis. 3. Small amount of ascites noted.   Electronically Signed   By: Norva Pavlov M.D.   On: 01/14/2015 17:44   US Biopsy  01/12/2015   INDICATION: Proteinuria of uncertain etiology. Please perform ultrasound-guided biopsy for tissue diagnostic purposes.  EXAM: ULTRASOUND GUIDED RENAL BIOPSY  COMPARISON:  Renal ultrasound - 10/21/2014  MEDICATIONS: Fentanyl 50 mcg IV; Versed 1 mg IV  ANESTHESIA/SEDATION: Total Moderate Sedation time  15 minutes  COMPLICATIONS: SIR level A: No therapy, no consequence.  Procedure complicated by development of a tiny, asymptomatic, non enlarging left-sided perinephric hematoma.  PROCEDURE: Informed written  consent was obtained from the patient after a discussion of the risks, benefits and alternatives to treatment. The patient understands and consents the procedure. A timeout was performed prior to the initiation of the procedure.  Ultrasound scanning was performed of the bilateral flanks. The inferior pole of the left kidney was selected for biopsy due to location and sonographic window. The procedure was planned. The operative site was prepped and draped in the usual sterile fashion. The overlying soft tissues were anesthetized with 1% lidocaine with epinephrine. A 17 gauge core needle biopsy device was advanced into the inferior cortex of the left kidney and 3 core biopsies were obtained under direct ultrasound guidance. Real time pathologic review confirmed adequate tissue acquisition. Images were saved for documentation purposes. The biopsy device was removed and hemostasis was obtained with manual compression.  Postprocedural scanning and demonstrated development of a very tiny perinephric hematoma which was noted to not enlarged on the acquired delayed postprocedural imaging (representative images 8 and 9). Post procedural scanning was negative for significant post procedural hemorrhage or additional complication. A dressing was placed. The patient tolerated the procedure well without immediate post procedural complication.  IMPRESSION: Technically successful ultrasound guided left renal biopsy.   Electronically Signed   By: Simonne Come M.D.   On: 01/12/2015 13:57   Ir Fluoro Guide Cv Line Right  01/26/2015   CLINICAL DATA:  Renal disease  EXAM: TUNNELED DIALYSIS CATHETER PLACEMENT, ULTRASOUND GUIDANCE FOR VASCULAR ACCESS  FLUOROSCOPY TIME:  12 seconds  MEDICATIONS AND MEDICAL HISTORY: Versed 0.5 mg, Fentanyl 25 mcg.  As antibiotic prophylaxis, Ancef was ordered pre-procedure and administered intravenously within one hour of incision.  ANESTHESIA/SEDATION: Moderate sedation time: 15 minutes  CONTRAST:  None  PROCEDURE: The procedure, risks, benefits, and alternatives were explained to the patient. Questions regarding the procedure were encouraged and answered. The patient understands and consents to the procedure.  The right neck was prepped with Betadine in a sterile fashion, and a sterile drape was applied covering the operative field. A sterile gown and sterile gloves were used for the procedure. 1% lidocaine into the skin and subcutaneous tissue. The right internal jugular vein was noted to be patent initially with ultrasound. Under sonographic guidance, a micropuncture needle was inserted into the right IJ vein (Ultrasound and fluoroscopic image documentation was performed). It was removed over an 018 wire which was upsized to an Amplatz. This was advanced into the IVC.  A small incision was made in the right upper chest. The tunneling device was utilized to advance the 23 centimeter tip to cuff catheter from the chest incision and out the neck incision. A peel-away sheath was advanced over the Amplatz wire. The leading edge of the catheter was then advanced through the peel-away sheath. The peel-away sheath was removed. It was flushed and instilled with heparin. The chest incision was closed with a 0 Prolene pursestring stitch. The neck incision was closed with a 4-0 Vicryl subcuticular stitch.  COMPLICATIONS: None  FINDINGS: The image demonstrates placement of a tunneled dialysis catheter with its tip in the right atrium.  IMPRESSION: Successful right IJ vein tunneled dialysis catheter with its tip in the right atrium.   Electronically Signed   By: Jolaine Click M.D.   On: 01/26/2015 12:45   Ir US Guide Vasc Access Right  01/26/2015   CLINICAL DATA:  Renal disease  EXAM: TUNNELED DIALYSIS CATHETER PLACEMENT, ULTRASOUND GUIDANCE FOR VASCULAR ACCESS  FLUOROSCOPY TIME:  12 seconds  MEDICATIONS AND MEDICAL HISTORY: Versed 0.5 mg, Fentanyl 25 mcg.  As antibiotic prophylaxis, Ancef was ordered pre-procedure and  administered intravenously within one hour of incision.  ANESTHESIA/SEDATION: Moderate sedation time: 15 minutes  CONTRAST:  None  PROCEDURE: The procedure, risks, benefits, and alternatives were explained to the patient. Questions regarding the procedure were encouraged and answered. The patient understands and consents to the procedure.  The right neck was prepped with Betadine in a sterile fashion, and a sterile drape was applied covering the operative field. A sterile gown and sterile gloves were used for the procedure. 1% lidocaine into the skin and subcutaneous tissue. The right internal jugular vein was noted to be patent initially with ultrasound. Under sonographic guidance, a micropuncture needle was inserted into the right IJ vein (Ultrasound and fluoroscopic image documentation was performed). It was removed over an 018 wire which was upsized to an Amplatz. This was advanced into the IVC.  A small incision was made in the right upper chest. The tunneling device was utilized to advance the 23 centimeter tip to cuff catheter from the chest incision and out the neck incision. A peel-away sheath was advanced over the Amplatz wire. The leading edge of the catheter was then advanced through the peel-away sheath. The peel-away sheath was removed. It was flushed and instilled with heparin. The chest incision was closed with a 0 Prolene pursestring stitch. The neck incision was closed with a 4-0 Vicryl subcuticular stitch.  COMPLICATIONS: None  FINDINGS: The image demonstrates placement of a tunneled dialysis catheter with its tip in the right atrium.  IMPRESSION: Successful right IJ vein tunneled dialysis catheter with its tip in the right atrium.   Electronically Signed   By: Jolaine Click M.D.   On:  01/26/2015 12:45   Dg Chest Port 1 View  01/23/2015   CLINICAL DATA:  Shortness of breath  EXAM: PORTABLE CHEST - 1 VIEW  COMPARISON:  January 21, 2015  FINDINGS: The heart size and mediastinal contours are  within normal limits. There is no focal pneumonia or pulmonary edema. Minimal right pleural effusion is identified. The visualized skeletal structures are stable.  IMPRESSION: Minimal right pleural effusion.  No focal pneumonia.   Electronically Signed   By: Sherian Rein M.D.   On: 01/23/2015 09:40   Dg Chest Port 1 View  01/19/2015   CLINICAL DATA:  Renal failure.  EXAM: PORTABLE CHEST - 1 VIEW  COMPARISON:  01/17/2015.  FINDINGS: Endotracheal tube, left IJ line, NG tube in stable position. Heart size is stable. Bibasilar pulmonary infiltrates consistent pneumonia. Minimal interval improvement. No pleural effusion or pneumothorax.  IMPRESSION: 1. Lines and tubes in stable position. 2. Bibasilar infiltrates consistent with pneumonia. Minimal interval improvement.   Electronically Signed   By: Maisie Fus  Register   On: 01/19/2015 07:21   Dg Chest Port 1 View  01/17/2015   CLINICAL DATA:  Ventilator dependent respiratory failure. Followup left basilar atelectasis.  EXAM: PORTABLE CHEST - 1 VIEW  COMPARISON:  01/15/2015 and earlier.  FINDINGS: Endotracheal tube tip in satisfactory position approximately 4 cm above the carina. Nasogastric tube courses below the diaphragm into the stomach though its tip is not included on the image. Left jugular central venous catheter tip projects over the mid to lower SVC.  Cardiac silhouette normal in size, unchanged. Airspace opacities involving the lung bases bilaterally, right greater than left, progressive since the examination 2 days ago. Pulmonary vascularity normal. Possible small bilateral pleural effusions.  IMPRESSION: 1. Support apparatus satisfactory. 2. Pneumonia involving the lung bases, right greater than left, progressive since the examination 2 days ago. 3. Possible small bilateral pleural effusions.   Electronically Signed   By: Hulan Saas M.D.   On: 01/17/2015 11:28   Dg Chest Port 1 View  01/15/2015   CLINICAL DATA:  Septic shock, respiratory failure and  acute renal failure. Question left lower lobe pneumonia.  EXAM: PORTABLE CHEST - 1 VIEW  COMPARISON:  01/14/2015  FINDINGS: Endotracheal tube with tip 3.2 cm above the carina, left IJ central venous catheter with tip overlying the superior cavoatrial junction NG tube entering the stomach with tip off the field of view noted.  Left lower lung opacity has improved.  There is no evidence of pleural effusion or pneumothorax.  IMPRESSION: Improved left lower lung opacity, suggesting atelectasis.  No other significant change.   Electronically Signed   By: Harmon Pier M.D.   On: 01/15/2015 07:32   Dg Chest Port 1 View  01/14/2015   CLINICAL DATA:  Septic shock, respiratory failure and acute renal failure. Endotracheal tube and central line placement.  EXAM: PORTABLE CHEST - 1 VIEW  COMPARISON:  01/14/2015 and prior radiographs  FINDINGS: The cardiomediastinal silhouette is unchanged.  An endotracheal tube with tip 5.1 cm above the carina, left IJ central venous catheter with tip overlying the lower SVC and NG tube entering the stomach with tip off the field of view are noted.  Left lower lung airspace disease is present.  There is no evidence of pneumothorax or pleural effusion.  IMPRESSION: Left lower lung airspace disease/ pneumonia.  Support apparatus as described.  No evidence of pneumothorax.   Electronically Signed   By: Harmon Pier M.D.   On: 01/14/2015 16:38  Dg Chest Portable 1 View  01/14/2015   CLINICAL DATA:  Sepsis.  EXAM: PORTABLE CHEST - 1 VIEW  COMPARISON:  03/25/2014.  FINDINGS: Interval borderline enlargement of the cardiac silhouette and mild increase in prominence of the interstitial markings, accentuated by a decreased inspiration. Calcified granuloma in the right lower lung zone. No pleural fluid. Diffuse osteopenia.  IMPRESSION: Interval borderline cardiomegaly and mildly increased prominence of the interstitial markings, most likely due to a poor inspiration. No gross acute abnormality.    Electronically Signed   By: Beckie Salts M.D.   On: 01/14/2015 15:21   Dg Abd Portable 1v  01/17/2015   CLINICAL DATA:  Nasogastric tube placement  EXAM: PORTABLE ABDOMEN - 1 VIEW  COMPARISON:  None.  FINDINGS: Orogastric tube with the tip projecting over the antrum of the stomach. There is no bowel dilatation to suggest obstruction. There is no evidence of pneumoperitoneum, portal venous gas or pneumatosis. There are no pathologic calcifications along the expected course of the ureters.The osseous structures are unremarkable.  IMPRESSION: Orogastric tube with the tip projecting over the antrum of the stomach.   Electronically Signed   By: Elige Ko   On: 01/17/2015 11:19         Subjective: Patient denies fevers, chills, headache, chest pain, dyspnea, nausea, vomiting, diarrhea, abdominal pain, dysuria, hematuria   Objective: Filed Vitals:   01/27/15 2058 01/28/15 0414 01/28/15 1138 01/28/15 1647  BP: 118/41 133/61 133/53 145/63  Pulse: 62 88 86 93  Temp: 98.3 F (36.8 C) 98.6 F (37 C) 98.8 F (37.1 C) 98.1 F (36.7 C)  TempSrc: Oral Oral Oral Oral  Resp: 18 18 16 16   Height:      Weight:  56.926 kg (125 lb 8 oz)    SpO2: 99% 98% 98% 99%    Intake/Output Summary (Last 24 hours) at 01/28/15 1751 Last data filed at 01/28/15 1750  Gross per 24 hour  Intake   1257 ml  Output    651 ml  Net    606 ml   Weight change: 2.6 kg (5 lb 11.7 oz) Exam:   General:  Pt is alert, follows commands appropriately, not in acute distress  HEENT: No icterus, No thrush, No neck mass, [place]/AT  Cardiovascular: RRR, S1/S2, no rubs, no gallops  Respiratory: Diminished breath sounds with bibasilar crackles. No wheezing. Good air movement.  Abdomen: Soft/+BS, non tender, non distended, no guarding  Extremities: trace LE edema, No lymphangitis, No petechiae, No rashes, no synovitis  Data Reviewed: Basic Metabolic Panel:  Recent Labs Lab 01/24/15 0302 01/25/15 0550 01/26/15 0410  01/27/15 0448 01/28/15 0514  NA 123* 118* 129* 126*  125* 126*  K 4.4 4.3 3.6 3.9  3.9 4.1  CL 88* 84* 93* 91*  91* 93*  CO2 26 24 26 26  26 26   GLUCOSE 143* 126* 203* 203*  203* 181*  BUN 36* 39* 17 22*  22* 15  CREATININE 5.06* 5.54* 3.08* 4.31*  4.33* 2.70*  CALCIUM 7.6* 7.6* 7.5* 7.7*  7.7* 7.5*  MG 2.1 2.1 1.9 2.0 1.9  PHOS 4.9* 5.3* 3.7 3.7 3.2   Liver Function Tests:  Recent Labs Lab 01/24/15 0302 01/25/15 0550 01/26/15 0410 01/27/15 0448 01/28/15 0514  ALBUMIN 2.4* 2.5* 2.3* 2.4* 2.4*   No results for input(s): LIPASE, AMYLASE in the last 168 hours. No results for input(s): AMMONIA in the last 168 hours. CBC:  Recent Labs Lab 01/22/15 0500 01/28/15 1105  WBC 9.0 9.8  HGB 7.8* 7.9*  HCT 25.0* 24.6*  MCV 94.7 95.7  PLT 152 207   Cardiac Enzymes: No results for input(s): CKTOTAL, CKMB, CKMBINDEX, TROPONINI in the last 168 hours. BNP: Invalid input(s): POCBNP CBG:  Recent Labs Lab 01/27/15 1618 01/27/15 2006 01/28/15 0603 01/28/15 1136 01/28/15 1618  GLUCAP 265* 225* 166* 155* 152*    Recent Results (from the past 240 hour(s))  Urine culture     Status: None   Collection Time: 01/21/15  9:00 AM  Result Value Ref Range Status   Specimen Description URINE, CLEAN CATCH  Final   Special Requests NONE  Final   Culture NO GROWTH 1 DAY  Final   Report Status 01/22/2015 FINAL  Final     Scheduled Meds: . antiseptic oral rinse  7 mL Mouth Rinse BID  . aspirin  81 mg Oral Daily  . atorvastatin  10 mg Oral q1800  . calcium acetate  667 mg Oral TID WC  . carvedilol  12.5 mg Oral BID WC  . darbepoetin (ARANESP) injection - NON-DIALYSIS  100 mcg Subcutaneous Q Fri-1800  . dextromethorphan-guaiFENesin  1 tablet Oral BID  . feeding supplement (NEPRO CARB STEADY)  237 mL Oral Q24H  . heparin subcutaneous  5,000 Units Subcutaneous 3 times per day  . insulin aspart  0-5 Units Subcutaneous QHS  . insulin aspart  0-9 Units Subcutaneous TID WC  .  insulin aspart  4 Units Subcutaneous TID WC  . insulin glargine  10 Units Subcutaneous Daily  . latanoprost  1 drop Both Eyes QHS  . losartan  50 mg Oral QHS  . salmeterol  1 puff Inhalation BID   Continuous Infusions:    TAT, DAVID, DO  [facility] Pager [phone]  If 7PM-7AM, please contact night-coverage [email or web address] Password [email or web address] 01/28/2015, 5:51 PM   LOS: 14 days

## 2015-01-28 NOTE — Progress Notes (Signed)
At 1220 introduced self as incoming nurse until 7p.   Call bell at bedside.  Instructed to call for assistance.  Verbalized understanding.  Will continue to monitor.  Salene Mohamud,RN.

## 2015-01-29 DIAGNOSIS — J438 Other emphysema: Secondary | ICD-10-CM

## 2015-01-29 LAB — RENAL FUNCTION PANEL
ANION GAP: 7 (ref 5–15)
Albumin: 2.3 g/dL — ABNORMAL LOW (ref 3.5–5.0)
BUN: 22 mg/dL — ABNORMAL HIGH (ref 6–20)
CHLORIDE: 93 mmol/L — AB (ref 101–111)
CO2: 25 mmol/L (ref 22–32)
Calcium: 8 mg/dL — ABNORMAL LOW (ref 8.9–10.3)
Creatinine, Ser: 3.59 mg/dL — ABNORMAL HIGH (ref 0.44–1.00)
GFR calc Af Amer: 14 mL/min — ABNORMAL LOW (ref >60)
GFR calc non Af Amer: 12 mL/min — ABNORMAL LOW (ref >60)
GLUCOSE: 204 mg/dL — AB (ref 65–99)
PHOSPHORUS: 3.7 mg/dL (ref 2.5–4.6)
POTASSIUM: 4.2 mmol/L (ref 3.5–5.1)
Sodium: 125 mmol/L — ABNORMAL LOW (ref 135–145)

## 2015-01-29 LAB — MAGNESIUM: Magnesium: 1.9 mg/dL (ref 1.7–2.4)

## 2015-01-29 LAB — GLUCOSE, CAPILLARY
GLUCOSE-CAPILLARY: 169 mg/dL — AB (ref 65–99)
GLUCOSE-CAPILLARY: 140 mg/dL — AB (ref 65–99)
GLUCOSE-CAPILLARY: 229 mg/dL — AB (ref 65–99)
Glucose-Capillary: 182 mg/dL — ABNORMAL HIGH (ref 65–99)

## 2015-01-29 NOTE — Progress Notes (Signed)
Patient ID: Pamela [name], female   DOB: January 12, 1951, 64 y.o.   MRN: [id number]  [facility] Progress Note   Assessment/ Plan:   1. 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 Continue on a Monday/Wednesday/Friday dialysis schedule while here at the hospital with her next dialysis ordered for tomorrow via right IJ tunneled dialysis catheter. From Dr. Nicky Pugh note-likely to be a candidate for left BCF versus BVT (possibly surgery Tuesday) 2. Sepsis- culture data negative to date-completed empiric antibiotic course for management of sepsis 3. Anemia- on ESA with low hemoglobin-will recheck CBC today to decide on possible needs for PRBCs. 4. Secondary hyperparathyroidism- last PTH 102 and not on vit D, phosphorus levels well controlled on low-dose binder 5. HTN/volume- BP fairly controlled on current meds--started on losartan yesterday in exchange for Bidil, continue to monitor blood pressures 6. Hyponatremia: Due to impaired free water excretion with ESRD-improving with dialysis- continue fluid restriction to 1250mL/day  Subjective:   Reports with had a good night-denies any complaints at this time    Objective:   BP 158/61 mmHg  Pulse 85  Temp(Src) 98.8 F (37.1 C) (Oral)  Resp 16  Ht  (1.549 m)  Wt 58.469 kg (128 lb 14.4 oz)  BMI 24.37 kg/m2  SpO2 98%  Intake/Output Summary (Last 24 hours) at 01/29/15 0904 Last data filed at 01/29/15 0843  Gross per 24 hour  Intake    680 ml  Output    801 ml  Net   -121 ml   Weight change: 4.069 kg (8 lb 15.5 oz)  Physical Exam: Gen: Getting ready to sit on bedside commode [id number] regular in rate and rhythm, S1 and S2 normal  Resp:CTA bilaterally (anterior chest)- no rales/rhonchi YNW:GNFA, flat, nontender Ext: No LE edema. RIJ TDC  Imaging: No results  found.  Labs: BMET  Recent Labs Lab 01/23/15 0301 01/24/15 0302 01/25/15 0550 01/26/15 0410 01/27/15 0448 01/28/15 0514 01/29/15 0332  NA 130* 123* 118* 129* 126*  125* 126* 125*  K 4.1 4.4 4.3 3.6 3.9  3.9 4.1 4.2  CL 95* 88* 84* 93* 91*  91* 93* 93*  CO2 GLUCOSE 125* 143* 126* 203* 203*  203* 181* 204*  BUN 31* 36* 39* 17 22*  22* 15 22*  CREATININE 4.11* 5.06* 5.54* 3.08* 4.31*  4.33* 2.70* 3.59*  CALCIUM 7.9* 7.6* 7.6* 7.5* 7.7*  7.7* 7.5* 8.0*  PHOS 4.3 4.9* 5.3* 3.7 3.7 3.2 3.7   CBC  Recent Labs Lab 01/28/15 1105  WBC 9.8  HGB 7.9*  HCT 24.6*  MCV 95.7  PLT 207   Medications:    . antiseptic oral rinse  7 mL Mouth Rinse BID  . aspirin  81 mg Oral Daily  . atorvastatin  10 mg Oral q1800  . calcium acetate  667 mg Oral TID WC  . carvedilol  12.5 mg Oral BID WC  . darbepoetin (ARANESP) injection - NON-DIALYSIS  100 mcg Subcutaneous Q Fri-1800  . dextromethorphan-guaiFENesin  1 tablet Oral BID  . feeding supplement (NEPRO CARB STEADY)  237 mL Oral Q24H  . heparin subcutaneous  5,000 Units Subcutaneous 3 times per day  . insulin aspart  0-5 Units Subcutaneous QHS  . insulin aspart  0-9 Units Subcutaneous TID WC  . insulin aspart  4 Units Subcutaneous TID WC  .  insulin glargine  10 Units Subcutaneous Daily  . latanoprost  1 drop Both Eyes QHS  . losartan  50 mg Oral QHS  . salmeterol  1 puff Inhalation BID   Zetta Bills, MD 01/29/2015, 9:04 AM

## 2015-01-29 NOTE — Progress Notes (Signed)
PROGRESS NOTE  Pamela [name]:[id number] DOB: [birth year] DOA: 01/14/2015 PCP: Georgann Housekeeper, MD  Brief History pleasant 64 year old Caucasian female with history of hypertensive nephrosclerosis with CKD stage IV under the care of Dr. Kathrene Bongo, CAD with chronic combined diastolic and systolic heart failure EF around 30%, mild AS, essential hypertension, dyslipidemia who underwent an outpatient kidney biopsy on 01/12/2015 thereafter presented to the ER on 01/14/2015 for decreased mentation due to uremia. She was found to be in acute renal failure with hyperkalemia, developed respiratory failure due to combination of fluid overload along with failure to protect airway due to uremia. She was intubated and kept in ICU. There was concern of sepsis from possible urinary tract infection. Patient was started on antibiotics and ultimately finished her regimen. She was seen by cardiology and nephrology. Right femoral Vas-Cath was placed and she was started on dialysis this admission. She underwent TTE along with TEE both confirming severe combined systolic and diastolic CHF however no evidence of endocarditis.  She was stabilized and transferred to hospitalist service on day 7 of her hospital stay date 01/21/2015.  Unfortunately her renal function continued to decline.  On 01/24/2015 it was decided that she would likely require permanent dialysis. IR was been consulted and right IJ PermCath was placed. Vascular surgery was consulted for a permanent access. Plans for fistula are tentatively for 01/31/2015 Assessment/Plan: Acute hypoxic respiratory failure  -due to combination of toxic encephalopathy failure to protect airway, fluid overload.  -Requiring intubation and extubation 2, finally extubated on 01/20/2015. -Try to titrate off oxygen, supportive care with Neb treatments as needed. 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  -01/25/15--PermCath placement and HD -VVS for fistula creation--tentatively planned for 01/31/15 -await CLIP process for outpt dialysis chair  Acute on chronic systolic and diastolic CHF /ischemic cardiomyopathy.  -EF 25-30%--- Reduced from 35-40% on echo 11/15 and cath 12/15.  -Underwent TTE and TEE -fluid removal via dialysis - Baseline weight appears to be 112-114 -Continue Coreg increased to 12.5 bid 01/26/15. -ASA 81 mg daily -continue statin -losartan started; stopped Bi-Dil  Toxic/Metabolic encephalopathy due to uremia.  -Resolved   COPD.  -No wheezing, nebulizer treatments as needed -stable on RA -serevent restarted   Dyslipidemia. -Continue Lipitor  Left cephalic vein SVT  -noted on ultrasound done in ICU on 01/18/2015.  -Supportive care.  -Discussed with vascular surgery-->No further evaluation or treatment for SVT.   DM type II.  -01/21/2015 hemoglobin A1c 6.8 -Continue Lantus 10 units daily -Continue NovoLog sliding scale -CBGs increasing with increasing po intake -Increase novolog 5 units premeal -d/c amaryl -will not restart metformin after d/c  Hyponatremia -secondary to renal failure and fluid overload -improvement with HD  Questionable sepsis.  -No clear source, all cultures negative,  -UA was clear, chest x-ray was clear, one of her sputum cultures grew Enterobacter cloacae -in ICU was tapered down to Rocephin which she finished on 01/21/2015.  -stable repeat 2 view chest x-ray on 01/21/2015.   Family Communication: Husband updated at beside Disposition Plan: Home when medically stable and dialysis chair obtained     Procedures/Studies: Dg Chest 2 View  01/21/2015   CLINICAL  DATA:  Cough.  EXAM: CHEST  2 VIEW  COMPARISON:  January 19, 2015.  FINDINGS: The heart size and mediastinal contours are within normal limits. Both lungs are clear. No pneumothorax is noted. Minimal bilateral pleural effusions are noted. The visualized skeletal structures are unremarkable.  IMPRESSION: Minimal bilateral pleural effusions. No other significant abnormality seen in the chest.   Electronically Signed   By: Lupita Raider, M.D.   On: 01/21/2015 20:27   US Renal  01/14/2015   CLINICAL DATA:  Renal failure. History of hypertension, COPD, diabetes.  EXAM: RENAL / URINARY TRACT ULTRASOUND COMPLETE  COMPARISON:  10/21/2014  FINDINGS: Right Kidney:  Length: 9.9 cm. Renal parenchyma is echogenic. Multiple small cysts are identified. The largest is seen in the lower pole region measuring 1.1 cm. No hydronephrosis.  Left Kidney:  Length: 10.2 cm. Echogenic renal parenchyma. Multiple small cysts are identified, largest in the lower pole region which measures 1.3 cm.  Bladder:  A Foley catheter decompresses the bladder.  Additional:  Note is made of a small amount of ascites.  IMPRESSION: 1. Echogenic kidneys bilaterally.  Bilateral renal cysts. 2. No hydronephrosis. 3. Small amount of ascites noted.   Electronically Signed   By: Norva Pavlov M.D.   On: 01/14/2015 17:44   US Biopsy  01/12/2015   INDICATION: Proteinuria of uncertain etiology. Please perform ultrasound-guided biopsy for tissue diagnostic purposes.  EXAM: ULTRASOUND GUIDED RENAL BIOPSY  COMPARISON:  Renal ultrasound - 10/21/2014  MEDICATIONS: Fentanyl 50 mcg IV; Versed 1 mg IV  ANESTHESIA/SEDATION: Total Moderate Sedation time  15 minutes  COMPLICATIONS: SIR level A: No therapy, no consequence.  Procedure complicated by development of a tiny, asymptomatic, non enlarging left-sided perinephric hematoma.  PROCEDURE: Informed written consent was obtained from the patient after a discussion of the risks, benefits and alternatives to treatment.  The patient understands and consents the procedure. A timeout was performed prior to the initiation of the procedure.  Ultrasound scanning was performed of the bilateral flanks. The inferior pole of the left kidney was selected for biopsy due to location and sonographic window. The procedure was planned. The operative site was prepped and draped in the usual sterile fashion. The overlying soft tissues were anesthetized with 1% lidocaine with epinephrine. A 17 gauge core needle biopsy device was advanced into the inferior cortex of the left kidney and 3 core biopsies were obtained under direct ultrasound guidance. Real time pathologic review confirmed adequate tissue acquisition. Images were saved for documentation purposes. The biopsy device was removed and hemostasis was obtained with manual compression.  Postprocedural scanning and demonstrated development of a very tiny perinephric hematoma which was noted to not enlarged on the acquired delayed postprocedural imaging (representative images 8 and 9). Post procedural scanning was negative for significant post procedural hemorrhage or additional complication. A dressing was placed. The patient tolerated the procedure well without immediate post procedural complication.  IMPRESSION: Technically successful ultrasound guided left renal biopsy.   Electronically Signed   By: Simonne Come M.D.   On: 01/12/2015 13:57   Ir Fluoro Guide Cv Line Right  01/26/2015   CLINICAL DATA:  Renal disease  EXAM: TUNNELED DIALYSIS CATHETER PLACEMENT, ULTRASOUND GUIDANCE FOR VASCULAR ACCESS  FLUOROSCOPY TIME:  12 seconds  MEDICATIONS AND MEDICAL HISTORY: Versed 0.5 mg, Fentanyl 25 mcg.  As antibiotic prophylaxis, Ancef was ordered pre-procedure and administered intravenously within one hour of incision.  ANESTHESIA/SEDATION: Moderate sedation time: 15 minutes  CONTRAST:  None  PROCEDURE: The procedure, risks, benefits, and alternatives were explained to the patient. Questions regarding  the procedure were encouraged and answered. The patient understands and consents to the procedure.  The right neck was prepped with Betadine in a sterile fashion, and a sterile drape was applied covering the operative field. A sterile gown and sterile gloves were used for the procedure. 1% lidocaine into the skin and subcutaneous tissue. The right internal jugular vein was noted to be patent initially with ultrasound. Under sonographic guidance, a micropuncture needle was inserted into the right IJ vein (Ultrasound and fluoroscopic image documentation was performed). It was removed over an 018 wire which was upsized to an Amplatz. This was advanced into the IVC.  A small incision was made in the right upper chest. The tunneling device was utilized to advance the 23 centimeter tip to cuff catheter from the chest incision and out the neck incision. A peel-away sheath was advanced over the Amplatz wire. The leading edge of the catheter was then advanced through the peel-away sheath. The peel-away sheath was removed. It was flushed and instilled with heparin. The chest incision was closed with a 0 Prolene pursestring stitch. The neck incision was closed with a 4-0 Vicryl subcuticular stitch.  COMPLICATIONS: None  FINDINGS: The image demonstrates placement of a tunneled dialysis catheter with its tip in the right atrium.  IMPRESSION: Successful right IJ vein tunneled dialysis catheter with its tip in the right atrium.   Electronically Signed   By: Jolaine Click M.D.   On: 01/26/2015 12:45   Ir US Guide Vasc Access Right  01/26/2015   CLINICAL DATA:  Renal disease  EXAM: TUNNELED DIALYSIS CATHETER PLACEMENT, ULTRASOUND GUIDANCE FOR VASCULAR ACCESS  FLUOROSCOPY TIME:  12 seconds  MEDICATIONS AND MEDICAL HISTORY: Versed 0.5 mg, Fentanyl 25 mcg.  As antibiotic prophylaxis, Ancef was ordered pre-procedure and administered intravenously within one hour of incision.  ANESTHESIA/SEDATION: Moderate sedation time: 15 minutes   CONTRAST:  None  PROCEDURE: The procedure, risks, benefits, and alternatives were explained to the patient. Questions regarding the procedure were encouraged and answered. The patient understands and consents to the procedure.  The right neck was prepped with Betadine in a sterile fashion, and a sterile drape was applied covering the operative field. A sterile gown and sterile gloves were used for the procedure. 1% lidocaine into the skin and subcutaneous tissue. The right internal jugular vein was noted to be patent initially with ultrasound. Under sonographic guidance, a micropuncture needle was inserted into the right IJ vein (Ultrasound and fluoroscopic image documentation was performed). It was removed over an 018 wire which was upsized to an Amplatz. This was advanced into the IVC.  A small incision was made in the right upper chest. The tunneling device was utilized to advance the 23 centimeter tip to cuff catheter from the chest incision and out the neck incision. A peel-away sheath was advanced over the Amplatz wire. The leading edge of the catheter was then advanced through the peel-away sheath. The peel-away sheath was removed. It was flushed and instilled with heparin. The chest incision was closed with a 0 Prolene pursestring stitch. The neck incision was closed with a 4-0 Vicryl subcuticular stitch.  COMPLICATIONS: None  FINDINGS: The image demonstrates placement of a tunneled dialysis catheter with its tip in the right atrium.  IMPRESSION: Successful right IJ vein tunneled dialysis catheter with its tip in the right atrium.   Electronically Signed   By: Jolaine Click M.D.   On: 01/26/2015 12:45   Dg Chest Port 1 View  01/23/2015   CLINICAL  DATA:  Shortness of breath  EXAM: PORTABLE CHEST - 1 VIEW  COMPARISON:  January 21, 2015  FINDINGS: The heart size and mediastinal contours are within normal limits. There is no focal pneumonia or pulmonary edema. Minimal right pleural effusion is identified.  The visualized skeletal structures are stable.  IMPRESSION: Minimal right pleural effusion.  No focal pneumonia.   Electronically Signed   By: Sherian Rein M.D.   On: 01/23/2015 09:40   Dg Chest Port 1 View  01/19/2015   CLINICAL DATA:  Renal failure.  EXAM: PORTABLE CHEST - 1 VIEW  COMPARISON:  01/17/2015.  FINDINGS: Endotracheal tube, left IJ line, NG tube in stable position. Heart size is stable. Bibasilar pulmonary infiltrates consistent pneumonia. Minimal interval improvement. No pleural effusion or pneumothorax.  IMPRESSION: 1. Lines and tubes in stable position. 2. Bibasilar infiltrates consistent with pneumonia. Minimal interval improvement.   Electronically Signed   By: Maisie Fus  Register   On: 01/19/2015 07:21   Dg Chest Port 1 View  01/17/2015   CLINICAL DATA:  Ventilator dependent respiratory failure. Followup left basilar atelectasis.  EXAM: PORTABLE CHEST - 1 VIEW  COMPARISON:  01/15/2015 and earlier.  FINDINGS: Endotracheal tube tip in satisfactory position approximately 4 cm above the carina. Nasogastric tube courses below the diaphragm into the stomach though its tip is not included on the image. Left jugular central venous catheter tip projects over the mid to lower SVC.  Cardiac silhouette normal in size, unchanged. Airspace opacities involving the lung bases bilaterally, right greater than left, progressive since the examination 2 days ago. Pulmonary vascularity normal. Possible small bilateral pleural effusions.  IMPRESSION: 1. Support apparatus satisfactory. 2. Pneumonia involving the lung bases, right greater than left, progressive since the examination 2 days ago. 3. Possible small bilateral pleural effusions.   Electronically Signed   By: Hulan Saas M.D.   On: 01/17/2015 11:28   Dg Chest Port 1 View  01/15/2015   CLINICAL DATA:  Septic shock, respiratory failure and acute renal failure. Question left lower lobe pneumonia.  EXAM: PORTABLE CHEST - 1 VIEW  COMPARISON:  01/14/2015   FINDINGS: Endotracheal tube with tip 3.2 cm above the carina, left IJ central venous catheter with tip overlying the superior cavoatrial junction NG tube entering the stomach with tip off the field of view noted.  Left lower lung opacity has improved.  There is no evidence of pleural effusion or pneumothorax.  IMPRESSION: Improved left lower lung opacity, suggesting atelectasis.  No other significant change.   Electronically Signed   By: Harmon Pier M.D.   On: 01/15/2015 07:32   Dg Chest Port 1 View  01/14/2015   CLINICAL DATA:  Septic shock, respiratory failure and acute renal failure. Endotracheal tube and central line placement.  EXAM: PORTABLE CHEST - 1 VIEW  COMPARISON:  01/14/2015 and prior radiographs  FINDINGS: The cardiomediastinal silhouette is unchanged.  An endotracheal tube with tip 5.1 cm above the carina, left IJ central venous catheter with tip overlying the lower SVC and NG tube entering the stomach with tip off the field of view are noted.  Left lower lung airspace disease is present.  There is no evidence of pneumothorax or pleural effusion.  IMPRESSION: Left lower lung airspace disease/ pneumonia.  Support apparatus as described.  No evidence of pneumothorax.   Electronically Signed   By: Harmon Pier M.D.   On: 01/14/2015 16:38   Dg Chest Portable 1 View  01/14/2015   CLINICAL DATA:  Sepsis.  EXAM: PORTABLE CHEST - 1 VIEW  COMPARISON:  03/25/2014.  FINDINGS: Interval borderline enlargement of the cardiac silhouette and mild increase in prominence of the interstitial markings, accentuated by a decreased inspiration. Calcified granuloma in the right lower lung zone. No pleural fluid. Diffuse osteopenia.  IMPRESSION: Interval borderline cardiomegaly and mildly increased prominence of the interstitial markings, most likely due to a poor inspiration. No gross acute abnormality.   Electronically Signed   By: Beckie Salts M.D.   On: 01/14/2015 15:21   Dg Abd Portable 1v  01/17/2015   CLINICAL  DATA:  Nasogastric tube placement  EXAM: PORTABLE ABDOMEN - 1 VIEW  COMPARISON:  None.  FINDINGS: Orogastric tube with the tip projecting over the antrum of the stomach. There is no bowel dilatation to suggest obstruction. There is no evidence of pneumoperitoneum, portal venous gas or pneumatosis. There are no pathologic calcifications along the expected course of the ureters.The osseous structures are unremarkable.  IMPRESSION: Orogastric tube with the tip projecting over the antrum of the stomach.   Electronically Signed   By: Elige Ko   On: 01/17/2015 11:19        Subjective: Patient denies fevers, chills, headache, chest pain, dyspnea, nausea, vomiting, diarrhea, abdominal pain, dysuria, hematuria   Objective: Filed Vitals:   01/28/15 2126 01/29/15 0551 01/29/15 0813 01/29/15 1216  BP:  142/69 158/61 148/60  Pulse: 84 86 85 88  Temp:  98.8 F (37.1 C) 98.7 F (37.1 C) 98.1 F (36.7 C)  TempSrc:  Oral  Oral  Resp: Height:      Weight:  58.469 kg (128 lb 14.4 oz)    SpO2: 97% 98% 97% 99%    Intake/Output Summary (Last 24 hours) at 01/29/15 1650 Last data filed at 01/29/15 1410  Gross per 24 hour  Intake    590 ml  Output    701 ml  Net   -111 ml   Weight change: 4.069 kg (8 lb 15.5 oz) Exam:   General:  Pt is alert, follows commands appropriately, not in acute distress  HEENT: No icterus, No thrush, No neck mass, [place]/AT  Cardiovascular: RRR, S1/S2, no rubs, no gallops  Respiratory: Diminished breath sounds at the bases. Fine bibasilar crackles. No wheezing.  Abdomen: Soft/+BS, non tender, non distended, no guarding  Extremities: 1+LE edema, No lymphangitis, No petechiae, No rashes, no synovitis  Data Reviewed: Basic Metabolic Panel:  Recent Labs Lab 01/25/15 0550 01/26/15 0410 01/27/15 0448 01/28/15 0514 01/29/15 0332  NA 118* 129* 126*  125* 126* 125*  K 4.3 3.6 3.9  3.9 4.1 4.2  CL 84* 93* 91*  91* 93* 93*  CO2 GLUCOSE 126* 203* 203*  203* 181* 204*  BUN 39* 17 22*  22* 15 22*  CREATININE 5.54* 3.08* 4.31*  4.33* 2.70* 3.59*  CALCIUM 7.6* 7.5* 7.7*  7.7* 7.5* 8.0*  MG 2.1 1.9 2.0 1.9 1.9  PHOS 5.3* 3.7 3.7 3.2 3.7   Liver Function Tests:  Recent Labs Lab 01/25/15 0550 01/26/15 0410 01/27/15 0448 01/28/15 0514 01/29/15 0332  ALBUMIN 2.5* 2.3* 2.4* 2.4* 2.3*   No results for input(s): LIPASE, AMYLASE in the last 168 hours. No results for input(s): AMMONIA in the last 168 hours. CBC:  Recent Labs Lab 01/28/15 1105  WBC 9.8  HGB 7.9*  HCT 24.6*  MCV 95.7  PLT 207   Cardiac Enzymes: No results for input(s): CKTOTAL, CKMB,  CKMBINDEX, TROPONINI in the last 168 hours. BNP: Invalid input(s): POCBNP CBG:  Recent Labs Lab 01/28/15 1618 01/28/15 2134 01/29/15 0603 01/29/15 1126 01/29/15 1620  GLUCAP 152* 147* 182* 169* 140*    Recent Results (from the past 240 hour(s))  Urine culture     Status: None   Collection Time: 01/21/15  9:00 AM  Result Value Ref Range Status   Specimen Description URINE, CLEAN CATCH  Final   Special Requests NONE  Final   Culture NO GROWTH 1 DAY  Final   Report Status 01/22/2015 FINAL  Final     Scheduled Meds: . antiseptic oral rinse  7 mL Mouth Rinse BID  . aspirin  81 mg Oral Daily  . atorvastatin  10 mg Oral q1800  . calcium acetate  667 mg Oral TID WC  . carvedilol  12.5 mg Oral BID WC  . darbepoetin (ARANESP) injection - NON-DIALYSIS  100 mcg Subcutaneous Q Fri-1800  . dextromethorphan-guaiFENesin  1 tablet Oral BID  . feeding supplement (NEPRO CARB STEADY)  237 mL Oral Q24H  . heparin subcutaneous  5,000 Units Subcutaneous 3 times per day  . insulin aspart  0-5 Units Subcutaneous QHS  . insulin aspart  0-9 Units Subcutaneous TID WC  . insulin aspart  4 Units Subcutaneous TID WC  . insulin glargine  10 Units Subcutaneous Daily  . latanoprost  1 drop Both Eyes QHS  . losartan  50 mg Oral QHS  . salmeterol  1 puff Inhalation  BID   Continuous Infusions:    TAT, DAVID, DO  [facility] Pager [phone]  If 7PM-7AM, please contact night-coverage [email or web address] Password [email or web address] 01/29/2015, 4:50 PM   LOS: 15 days

## 2015-01-30 LAB — RENAL FUNCTION PANEL
Albumin: 2.3 g/dL — ABNORMAL LOW (ref 3.5–5.0)
Anion gap: 10 (ref 5–15)
BUN: 32 mg/dL — AB (ref 6–20)
CHLORIDE: 86 mmol/L — AB (ref 101–111)
CO2: 23 mmol/L (ref 22–32)
Calcium: 7.8 mg/dL — ABNORMAL LOW (ref 8.9–10.3)
Creatinine, Ser: 4.45 mg/dL — ABNORMAL HIGH (ref 0.44–1.00)
GFR calc Af Amer: 11 mL/min — ABNORMAL LOW (ref >60)
GFR, EST NON AFRICAN AMERICAN: 10 mL/min — AB (ref >60)
GLUCOSE: 195 mg/dL — AB (ref 65–99)
POTASSIUM: 4.4 mmol/L (ref 3.5–5.1)
Phosphorus: 4.9 mg/dL — ABNORMAL HIGH (ref 2.5–4.6)
Sodium: 119 mmol/L — CL (ref 135–145)

## 2015-01-30 LAB — FERRITIN: Ferritin: 259 ng/mL (ref 11–307)

## 2015-01-30 LAB — GLUCOSE, CAPILLARY
GLUCOSE-CAPILLARY: 185 mg/dL — AB (ref 65–99)
GLUCOSE-CAPILLARY: 154 mg/dL — AB (ref 65–99)
Glucose-Capillary: 142 mg/dL — ABNORMAL HIGH (ref 65–99)
Glucose-Capillary: 244 mg/dL — ABNORMAL HIGH (ref 65–99)

## 2015-01-30 LAB — CBC
HEMATOCRIT: 23.3 % — AB (ref 36.0–46.0)
Hemoglobin: 7.5 g/dL — ABNORMAL LOW (ref 12.0–15.0)
MCH: 30 pg (ref 26.0–34.0)
MCHC: 32.2 g/dL (ref 30.0–36.0)
MCV: 93.2 fL (ref 78.0–100.0)
Platelets: 186 10*3/uL (ref 150–400)
RBC: 2.5 MIL/uL — ABNORMAL LOW (ref 3.87–5.11)
RDW: 18 % — AB (ref 11.5–15.5)
WBC: 7.2 10*3/uL (ref 4.0–10.5)

## 2015-01-30 LAB — MAGNESIUM: Magnesium: 2 mg/dL (ref 1.7–2.4)

## 2015-01-30 LAB — IRON AND TIBC
Iron: 30 ug/dL (ref 28–170)
SATURATION RATIOS: 14 % (ref 10.4–31.8)
TIBC: 214 ug/dL — ABNORMAL LOW (ref 250–450)
UIBC: 184 ug/dL

## 2015-01-30 MED ORDER — INSULIN GLARGINE 100 UNIT/ML ~~LOC~~ SOLN
12.0000 [IU] | Freq: Every day | SUBCUTANEOUS | Status: DC
  Administered 2015-02-01: 12 [IU] via SUBCUTANEOUS
  Filled 2015-01-30 (×2): qty 0.12

## 2015-01-30 MED ORDER — INSULIN ASPART 100 UNIT/ML ~~LOC~~ SOLN
5.0000 [IU] | Freq: Three times a day (TID) | SUBCUTANEOUS | Status: DC
  Administered 2015-01-31 – 2015-02-01 (×4): 5 [IU] via SUBCUTANEOUS

## 2015-01-30 MED ORDER — CALCIUM ACETATE (PHOS BINDER) 667 MG PO CAPS
667.0000 mg | ORAL_CAPSULE | Freq: Three times a day (TID) | ORAL | Status: DC
  Administered 2015-01-31 (×2): 667 mg via ORAL
  Filled 2015-01-30 (×3): qty 1

## 2015-01-30 MED ORDER — INSULIN ASPART 100 UNIT/ML ~~LOC~~ SOLN: 5.0000 [IU] | Freq: Three times a day (TID) | SUBCUTANEOUS | Status: DC

## 2015-01-30 NOTE — Progress Notes (Signed)
   Daily Progress Note  Assessment/Planning: Acute on chronic kidney failure   L arm AVF placement tomorrow  Informed consent repeated  Pt willing to proceed.  She is aware a staged L BVT maybe needed if the cephalic vein thrombus cannot be retrieved.   Subjective   Breathing better, awaiting HD today  Objective Filed Vitals:   01/29/15 1216 01/29/15 1738 01/29/15 2020 01/30/15 0354  BP: 148/60 161/70 123/60 145/66  Pulse: 88 85 82 84  Temp: 98.1 F (36.7 C)  98 F (36.7 C) 98.3 F (36.8 C)  TempSrc: Oral  Oral Oral  Resp: 20  18 17   Height:      Weight:    132 lb 12.8 oz (60.238 kg)  SpO2: 99%  97% 98%    Intake/Output Summary (Last 24 hours) at 01/30/15 0852 Last data filed at 01/30/15 0716  Gross per 24 hour  Intake   1500 ml  Output   1055 ml  Net    445 ml   VASC  R arm IV in ?brachial vein, L antecubitum echymotic  Laboratory CBC    Component Value Date/Time   WBC 7.2 01/30/2015 0326   HGB 7.5* 01/30/2015 0326   HCT 23.3* 01/30/2015 0326   PLT 186 01/30/2015 0326    BMET    Component Value Date/Time   NA 119* 01/30/2015 0326   K 4.4 01/30/2015 0326   CL 86* 01/30/2015 0326   CO2 23 01/30/2015 0326   GLUCOSE 195* 01/30/2015 0326   BUN 32* 01/30/2015 0326   CREATININE 4.45* 01/30/2015 0326   CALCIUM 7.8* 01/30/2015 0326   CALCIUM 9.4 12/06/2014 0821   GFRNONAA 10* 01/30/2015 0326   GFRAA 11* 01/30/2015 0326    Leonides Sake, MD [facility] Office: [phone] Pager: [phone]  01/30/2015, 8:52 AM

## 2015-01-30 NOTE — Procedures (Signed)
I have personally attended this patient's dialysis session.   Well tolerated R IJ TDC at 400 2 liters off without drop in BP  Camille Bal, MD [facility] [phone] Pager 01/30/2015, 12:38 PM

## 2015-01-30 NOTE — Progress Notes (Signed)
PROGRESS NOTE  Pamela [name] WJX:[id number] DOB: [birth year] DOA: 01/14/2015 PCP: Georgann Housekeeper, MD Brief History pleasant 64 year old Caucasian female with history of hypertensive nephrosclerosis with CKD stage IV under the care of Dr. Kathrene Bongo, CAD with chronic combined diastolic and systolic heart failure EF around 30%, mild AS, essential hypertension, dyslipidemia who underwent an outpatient kidney biopsy on 01/12/2015 thereafter presented to the ER on 01/14/2015 for decreased mentation due to uremia. She was found to be in acute renal failure with hyperkalemia, developed respiratory failure due to combination of fluid overload along with failure to protect airway due to uremia. She was intubated and kept in ICU. There was concern of sepsis from possible urinary tract infection. Patient was started on antibiotics and ultimately finished her regimen. She was seen by cardiology and nephrology. Right femoral Vas-Cath was placed and she was started on dialysis this admission. She underwent TTE along with TEE both confirming severe combined systolic and diastolic CHF however no evidence of endocarditis.  She was stabilized and transferred to hospitalist service on day 7 of her hospital stay date 01/21/2015.  Unfortunately her renal function continued to decline. On 01/24/2015 it was decided that she would likely require permanent dialysis. IR was been consulted and right IJ PermCath was placed. Vascular surgery was consulted for a permanent access. Plans for fistula are tentatively for 01/31/2015 Assessment/Plan: Acute hypoxic respiratory failure  -due to combination of toxic encephalopathy failure to protect airway, fluid overload.  -Requiring intubation and extubation 2, finally extubated on 01/20/2015. -Try to titrate off oxygen, supportive care with Neb treatments as needed. 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  -01/25/15--PermCath placement and HD -VVS for fistula creation--tentatively planned for 01/31/15 -await CLIP process for outpt dialysis chair  Acute on chronic systolic and diastolic CHF /ischemic cardiomyopathy.  -EF 25-30%--- Reduced from 35-40% on echo 11/15 and cath 12/15.  -Underwent TTE and TEE -fluid removal via dialysis--gained some fluid weight over weekend without HD - Baseline weight appears to be 112-114 -Continue Coreg increased to 12.5 bid 01/26/15. -ASA 81 mg daily -continue statin -losartan started; stopped Bi-Dil  Toxic/Metabolic encephalopathy due to uremia.  -Resolved   COPD.  -No wheezing, nebulizer treatments as needed -stable on RA -serevent restarted   Dyslipidemia. -Continue Lipitor  Left cephalic vein SVT  -noted on ultrasound done in ICU on 01/18/2015.  -Supportive care.  -Discussed with vascular surgery-->No further evaluation or treatment for SVT.   DM type II.  -01/21/2015 hemoglobin A1c 6.8 -Increase Lantus 12 units daily -Continue NovoLog sliding scale -CBGs increasing with increasing po intake -Increase novolog 5 units premeal -d/c amaryl -will not restart metformin after d/c  Hyponatremia -secondary to renal failure and fluid overload -improvement with HD  Questionable sepsis.  -No clear source, all cultures negative,  -UA was clear, chest x-ray was clear, one of her sputum cultures grew Enterobacter cloacae -in ICU was tapered down to Rocephin which she finished on 01/21/2015.  -stable repeat 2 view chest x-ray on 01/21/2015.   Family Communication: Husband updated at beside Disposition Plan: Home when medically stable and dialysis chair  obtained    Procedures/Studies: Dg Chest 2 View  01/21/2015   CLINICAL DATA:  Cough.  EXAM: CHEST  2 VIEW  COMPARISON:  January 19, 2015.  FINDINGS: The heart size and mediastinal contours are within normal limits. Both lungs are clear. No pneumothorax is noted. Minimal bilateral pleural effusions are noted. The visualized skeletal structures are unremarkable.  IMPRESSION: Minimal bilateral pleural effusions. No other significant abnormality seen in the chest.   Electronically Signed   By: Lupita Raider, M.D.   On: 01/21/2015 20:27   US Renal  01/14/2015   CLINICAL DATA:  Renal failure. History of hypertension, COPD, diabetes.  EXAM: RENAL / URINARY TRACT ULTRASOUND COMPLETE  COMPARISON:  10/21/2014  FINDINGS: Right Kidney:  Length: 9.9 cm. Renal parenchyma is echogenic. Multiple small cysts are identified. The largest is seen in the lower pole region measuring 1.1 cm. No hydronephrosis.  Left Kidney:  Length: 10.2 cm. Echogenic renal parenchyma. Multiple small cysts are identified, largest in the lower pole region which measures 1.3 cm.  Bladder:  A Foley catheter decompresses the bladder.  Additional:  Note is made of a small amount of ascites.  IMPRESSION: 1. Echogenic kidneys bilaterally.  Bilateral renal cysts. 2. No hydronephrosis. 3. Small amount of ascites noted.   Electronically Signed   By: Norva Pavlov M.D.   On: 01/14/2015 17:44   US Biopsy  01/12/2015   INDICATION: Proteinuria of uncertain etiology. Please perform ultrasound-guided biopsy for tissue diagnostic purposes.  EXAM: ULTRASOUND GUIDED RENAL BIOPSY  COMPARISON:  Renal ultrasound - 10/21/2014  MEDICATIONS: Fentanyl 50 mcg IV; Versed 1 mg IV  ANESTHESIA/SEDATION: Total Moderate Sedation time  15 minutes  COMPLICATIONS: SIR level A: No therapy, no consequence.  Procedure complicated by development of a tiny, asymptomatic, non enlarging left-sided perinephric hematoma.  PROCEDURE: Informed written consent was obtained from the  patient after a discussion of the risks, benefits and alternatives to treatment. The patient understands and consents the procedure. A timeout was performed prior to the initiation of the procedure.  Ultrasound scanning was performed of the bilateral flanks. The inferior pole of the left kidney was selected for biopsy due to location and sonographic window. The procedure was planned. The operative site was prepped and draped in the usual sterile fashion. The overlying soft tissues were anesthetized with 1% lidocaine with epinephrine. A 17 gauge core needle biopsy device was advanced into the inferior cortex of the left kidney and 3 core biopsies were obtained under direct ultrasound guidance. Real time pathologic review confirmed adequate tissue acquisition. Images were saved for documentation purposes. The biopsy device was removed and hemostasis was obtained with manual compression.  Postprocedural scanning and demonstrated development of a very tiny perinephric hematoma which was noted to not enlarged on the acquired delayed postprocedural imaging (representative images 8 and 9). Post procedural scanning was negative for significant post procedural hemorrhage or additional complication. A dressing was placed. The patient tolerated the procedure well without immediate post procedural complication.  IMPRESSION: Technically successful ultrasound guided left renal biopsy.   Electronically Signed   By: Simonne Come M.D.   On: 01/12/2015 13:57   Ir Fluoro Guide Cv Line Right  01/26/2015   CLINICAL DATA:  Renal disease  EXAM: TUNNELED DIALYSIS CATHETER PLACEMENT, ULTRASOUND GUIDANCE FOR VASCULAR ACCESS  FLUOROSCOPY TIME:  12 seconds  MEDICATIONS AND MEDICAL HISTORY: Versed 0.5 mg, Fentanyl 25 mcg.  As antibiotic prophylaxis, Ancef was ordered pre-procedure and administered intravenously within one hour of incision.  ANESTHESIA/SEDATION: Moderate sedation time: 15 minutes  CONTRAST:  None  PROCEDURE: The procedure,  risks, benefits, and alternatives were explained  to the patient. Questions regarding the procedure were encouraged and answered. The patient understands and consents to the procedure.  The right neck was prepped with Betadine in a sterile fashion, and a sterile drape was applied covering the operative field. A sterile gown and sterile gloves were used for the procedure. 1% lidocaine into the skin and subcutaneous tissue. The right internal jugular vein was noted to be patent initially with ultrasound. Under sonographic guidance, a micropuncture needle was inserted into the right IJ vein (Ultrasound and fluoroscopic image documentation was performed). It was removed over an 018 wire which was upsized to an Amplatz. This was advanced into the IVC.  A small incision was made in the right upper chest. The tunneling device was utilized to advance the 23 centimeter tip to cuff catheter from the chest incision and out the neck incision. A peel-away sheath was advanced over the Amplatz wire. The leading edge of the catheter was then advanced through the peel-away sheath. The peel-away sheath was removed. It was flushed and instilled with heparin. The chest incision was closed with a 0 Prolene pursestring stitch. The neck incision was closed with a 4-0 Vicryl subcuticular stitch.  COMPLICATIONS: None  FINDINGS: The image demonstrates placement of a tunneled dialysis catheter with its tip in the right atrium.  IMPRESSION: Successful right IJ vein tunneled dialysis catheter with its tip in the right atrium.   Electronically Signed   By: Jolaine Click M.D.   On: 01/26/2015 12:45   Ir US Guide Vasc Access Right  01/26/2015   CLINICAL DATA:  Renal disease  EXAM: TUNNELED DIALYSIS CATHETER PLACEMENT, ULTRASOUND GUIDANCE FOR VASCULAR ACCESS  FLUOROSCOPY TIME:  12 seconds  MEDICATIONS AND MEDICAL HISTORY: Versed 0.5 mg, Fentanyl 25 mcg.  As antibiotic prophylaxis, Ancef was ordered pre-procedure and administered intravenously  within one hour of incision.  ANESTHESIA/SEDATION: Moderate sedation time: 15 minutes  CONTRAST:  None  PROCEDURE: The procedure, risks, benefits, and alternatives were explained to the patient. Questions regarding the procedure were encouraged and answered. The patient understands and consents to the procedure.  The right neck was prepped with Betadine in a sterile fashion, and a sterile drape was applied covering the operative field. A sterile gown and sterile gloves were used for the procedure. 1% lidocaine into the skin and subcutaneous tissue. The right internal jugular vein was noted to be patent initially with ultrasound. Under sonographic guidance, a micropuncture needle was inserted into the right IJ vein (Ultrasound and fluoroscopic image documentation was performed). It was removed over an 018 wire which was upsized to an Amplatz. This was advanced into the IVC.  A small incision was made in the right upper chest. The tunneling device was utilized to advance the 23 centimeter tip to cuff catheter from the chest incision and out the neck incision. A peel-away sheath was advanced over the Amplatz wire. The leading edge of the catheter was then advanced through the peel-away sheath. The peel-away sheath was removed. It was flushed and instilled with heparin. The chest incision was closed with a 0 Prolene pursestring stitch. The neck incision was closed with a 4-0 Vicryl subcuticular stitch.  COMPLICATIONS: None  FINDINGS: The image demonstrates placement of a tunneled dialysis catheter with its tip in the right atrium.  IMPRESSION: Successful right IJ vein tunneled dialysis catheter with its tip in the right atrium.   Electronically Signed   By: Jolaine Click M.D.   On: 01/26/2015 12:45   Dg Chest [facility]  01/23/2015   CLINICAL DATA:  Shortness of breath  EXAM: PORTABLE CHEST - 1 VIEW  COMPARISON:  January 21, 2015  FINDINGS: The heart size and mediastinal contours are within normal limits. There is  no focal pneumonia or pulmonary edema. Minimal right pleural effusion is identified. The visualized skeletal structures are stable.  IMPRESSION: Minimal right pleural effusion.  No focal pneumonia.   Electronically Signed   By: Sherian Rein M.D.   On: 01/23/2015 09:40   Dg Chest Port 1 View  01/19/2015   CLINICAL DATA:  Renal failure.  EXAM: PORTABLE CHEST - 1 VIEW  COMPARISON:  01/17/2015.  FINDINGS: Endotracheal tube, left IJ line, NG tube in stable position. Heart size is stable. Bibasilar pulmonary infiltrates consistent pneumonia. Minimal interval improvement. No pleural effusion or pneumothorax.  IMPRESSION: 1. Lines and tubes in stable position. 2. Bibasilar infiltrates consistent with pneumonia. Minimal interval improvement.   Electronically Signed   By: Maisie Fus  Register   On: 01/19/2015 07:21   Dg Chest Port 1 View  01/17/2015   CLINICAL DATA:  Ventilator dependent respiratory failure. Followup left basilar atelectasis.  EXAM: PORTABLE CHEST - 1 VIEW  COMPARISON:  01/15/2015 and earlier.  FINDINGS: Endotracheal tube tip in satisfactory position approximately 4 cm above the carina. Nasogastric tube courses below the diaphragm into the stomach though its tip is not included on the image. Left jugular central venous catheter tip projects over the mid to lower SVC.  Cardiac silhouette normal in size, unchanged. Airspace opacities involving the lung bases bilaterally, right greater than left, progressive since the examination 2 days ago. Pulmonary vascularity normal. Possible small bilateral pleural effusions.  IMPRESSION: 1. Support apparatus satisfactory. 2. Pneumonia involving the lung bases, right greater than left, progressive since the examination 2 days ago. 3. Possible small bilateral pleural effusions.   Electronically Signed   By: Hulan Saas M.D.   On: 01/17/2015 11:28   Dg Chest Port 1 View  01/15/2015   CLINICAL DATA:  Septic shock, respiratory failure and acute renal failure. Question  left lower lobe pneumonia.  EXAM: PORTABLE CHEST - 1 VIEW  COMPARISON:  01/14/2015  FINDINGS: Endotracheal tube with tip 3.2 cm above the carina, left IJ central venous catheter with tip overlying the superior cavoatrial junction NG tube entering the stomach with tip off the field of view noted.  Left lower lung opacity has improved.  There is no evidence of pleural effusion or pneumothorax.  IMPRESSION: Improved left lower lung opacity, suggesting atelectasis.  No other significant change.   Electronically Signed   By: Harmon Pier M.D.   On: 01/15/2015 07:32   Dg Chest Port 1 View  01/14/2015   CLINICAL DATA:  Septic shock, respiratory failure and acute renal failure. Endotracheal tube and central line placement.  EXAM: PORTABLE CHEST - 1 VIEW  COMPARISON:  01/14/2015 and prior radiographs  FINDINGS: The cardiomediastinal silhouette is unchanged.  An endotracheal tube with tip 5.1 cm above the carina, left IJ central venous catheter with tip overlying the lower SVC and NG tube entering the stomach with tip off the field of view are noted.  Left lower lung airspace disease is present.  There is no evidence of pneumothorax or pleural effusion.  IMPRESSION: Left lower lung airspace disease/ pneumonia.  Support apparatus as described.  No evidence of pneumothorax.   Electronically Signed   By: Harmon Pier M.D.   On: 01/14/2015 16:38   Dg Chest Portable 1 View  01/14/2015   CLINICAL  DATA:  Sepsis.  EXAM: PORTABLE CHEST - 1 VIEW  COMPARISON:  03/25/2014.  FINDINGS: Interval borderline enlargement of the cardiac silhouette and mild increase in prominence of the interstitial markings, accentuated by a decreased inspiration. Calcified granuloma in the right lower lung zone. No pleural fluid. Diffuse osteopenia.  IMPRESSION: Interval borderline cardiomegaly and mildly increased prominence of the interstitial markings, most likely due to a poor inspiration. No gross acute abnormality.   Electronically Signed   By: Beckie Salts M.D.   On: 01/14/2015 15:21   Dg Abd Portable 1v  01/17/2015   CLINICAL DATA:  Nasogastric tube placement  EXAM: PORTABLE ABDOMEN - 1 VIEW  COMPARISON:  None.  FINDINGS: Orogastric tube with the tip projecting over the antrum of the stomach. There is no bowel dilatation to suggest obstruction. There is no evidence of pneumoperitoneum, portal venous gas or pneumatosis. There are no pathologic calcifications along the expected course of the ureters.The osseous structures are unremarkable.  IMPRESSION: Orogastric tube with the tip projecting over the antrum of the stomach.   Electronically Signed   By: Elige Ko   On: 01/17/2015 11:19        Subjective: Patient denies fevers, chills, headache, chest pain, dyspnea, nausea, vomiting, diarrhea, abdominal pain, dysuria, hematuria   Objective: Filed Vitals:   01/30/15 1145 01/30/15 1215 01/30/15 1245 01/30/15 1250  BP: 156/73 159/59 155/41 158/71  Pulse: 94 89 92 96  Temp:    98.7 F (37.1 C)  TempSrc:    Oral  Resp:    18  Height:      Weight:    58.3 kg (128 lb 8.5 oz)  SpO2:    96%    Intake/Output Summary (Last 24 hours) at 01/30/15 1750 Last data filed at 01/30/15 1635  Gross per 24 hour  Intake   1580 ml  Output   3005 ml  Net  -1425 ml   Weight change: 1.769 kg (3 lb 14.4 oz) Exam:   General:  Pt is alert, follows commands appropriately, not in acute distress  HEENT: No icterus, No thrush, No neck mass, [place]/AT  Cardiovascular: RRR, S1/S2, no rubs, no gallops  Respiratory: Diminished pulses at the bases without any wheezing.  Abdomen: Soft/+BS, non tender, non distended, no guarding  Extremities: 1+ LE edema, No lymphangitis, No petechiae, No rashes, no synovitis  Data Reviewed: Basic Metabolic Panel:  Recent Labs Lab 01/26/15 0410 01/27/15 0448 01/28/15 0514 01/29/15 0332 01/30/15 0326  NA 129* 126*  125* 126* 125* 119*  K 3.6 3.9  3.9 4.1 4.2 4.4  CL 93* 91*  91* 93* 93* 86*  CO2 GLUCOSE 203* 203*  203* 181* 204* 195*  BUN 17 22*  22* 15 22* 32*  CREATININE 3.08* 4.31*  4.33* 2.70* 3.59* 4.45*  CALCIUM 7.5* 7.7*  7.7* 7.5* 8.0* 7.8*  MG 1.9 2.0 1.9 1.9 2.0  PHOS 3.7 3.7 3.2 3.7 4.9*   Liver Function Tests:  Recent Labs Lab 01/26/15 0410 01/27/15 0448 01/28/15 0514 01/29/15 0332 01/30/15 0326  ALBUMIN 2.3* 2.4* 2.4* 2.3* 2.3*   No results for input(s): LIPASE, AMYLASE in the last 168 hours. No results for input(s): AMMONIA in the last 168 hours. CBC:  Recent Labs Lab 01/28/15 1105 01/30/15 0326  WBC 9.8 7.2  HGB 7.9* 7.5*  HCT 24.6* 23.3*  MCV 95.7 93.2  PLT 207 186   Cardiac Enzymes: No results for input(s): CKTOTAL, CKMB, CKMBINDEX,  TROPONINI in the last 168 hours. BNP: Invalid input(s): POCBNP CBG:  Recent Labs Lab 01/29/15 1620 01/29/15 2123 01/30/15 0543 01/30/15 1352 01/30/15 1629  GLUCAP 140* 229* 185* 142* 244*    Recent Results (from the past 240 hour(s))  Urine culture     Status: None   Collection Time: 01/21/15  9:00 AM  Result Value Ref Range Status   Specimen Description URINE, CLEAN CATCH  Final   Special Requests NONE  Final   Culture NO GROWTH 1 DAY  Final   Report Status 01/22/2015 FINAL  Final     Scheduled Meds: . antiseptic oral rinse  7 mL Mouth Rinse BID  . aspirin  81 mg Oral Daily  . atorvastatin  10 mg Oral q1800  . calcium acetate  667 mg Oral TID WC  . carvedilol  12.5 mg Oral BID WC  . darbepoetin (ARANESP) injection - NON-DIALYSIS  100 mcg Subcutaneous Q Fri-1800  . dextromethorphan-guaiFENesin  1 tablet Oral BID  . feeding supplement (NEPRO CARB STEADY)  237 mL Oral Q24H  . heparin subcutaneous  5,000 Units Subcutaneous 3 times per day  . insulin aspart  0-5 Units Subcutaneous QHS  . insulin aspart  0-9 Units Subcutaneous TID WC  . [START ON 01/31/2015] insulin aspart  5 Units Subcutaneous TID WC  . [START ON 01/31/2015] insulin glargine  12 Units Subcutaneous Daily  . latanoprost   1 drop Both Eyes QHS  . losartan  50 mg Oral QHS  . salmeterol  1 puff Inhalation BID   Continuous Infusions:    TAT, DAVID, DO  [facility] Pager [phone]  If 7PM-7AM, please contact night-coverage [email or web address] Password [email or web address] 01/30/2015, 5:50 PM   LOS: 16 days

## 2015-01-30 NOTE — Progress Notes (Signed)
Advanced Heart Failure Rounding Note  PCP: Dr Pamela [name] Primary Cardiologist: Dr Pamela [name]  Subjective:    Feels OK today, but feels swollen over weekend without dialysis.  Denies SOB.    Volume status up over the weekend with dialysis M/W/F schedule. Modest UO.   Objective:   Weight Range: 132 lb 12.8 oz (60.238 kg) Body mass index is 25.11 kg/(m^2).   Vital Signs:   Temp:  [98 F (36.7 C)-98.7 F (37.1 C)] 98.3 F (36.8 C) (09/19 0354) Pulse Rate:  [82-88] 84 (09/19 0354) Resp:  [17-20] 17 (09/19 0354) BP: (123-161)/(60-70) 145/66 mmHg (09/19 0354) SpO2:  [97 %-99 %] 98 % (09/19 0354) Weight:  [132 lb 12.8 oz (60.238 kg)] 132 lb 12.8 oz (60.238 kg) (09/19 0354) Last BM Date: 01/29/15  Weight change: Filed Weights   01/28/15 0414 01/29/15 0551 01/30/15 0354  Weight: 125 lb 8 oz (56.926 kg) 128 lb 14.4 oz (58.469 kg) 132 lb 12.8 oz (60.238 kg)    Intake/Output:   Intake/Output Summary (Last 24 hours) at 01/30/15 0749 Last data filed at 01/30/15 0716  Gross per 24 hour  Intake   1740 ml  Output   1055 ml  Net    685 ml     Physical Exam: General: Looks older than stated age. No resp distress HEENT: normal Neck: supple. JVP 12-13 cm. Carotids 2+ bilat; no bruits. No lymphadenopathy or thyromegaly . Cor: PMI nondisplaced. RRR. No M/G/R. Perm cath R chest.  Dressing clean, dry, and intact. Lungs: Diminished throughout. No crackles or wheezing appreciated. Abdomen: soft, non tender, non distended. No HSM noted. No bruits or masses. +BS Extremities: no cyanosis, clubbing, rash. 1-2+ ankle edema, 1+ edema to knees. Neuro: alert & oriented x 3, cranial nerves grossly intact. moves all 4 extremities w/o difficulty. Affect pleasant.   Telemetry: NSR 90-100s  Labs: CBC  Recent Labs  01/28/15 1105 01/30/15 0326  WBC 9.8 7.2  HGB 7.9* 7.5*  HCT 24.6* 23.3*  MCV 95.7 93.2  PLT 207 186   Basic Metabolic Panel  Recent Labs  01/29/15 0332  01/30/15 0326  NA 125* 119*  K 4.2 4.4  CL 93* 86*  CO2 25 23  GLUCOSE 204* 195*  BUN 22* 32*  CALCIUM 8.0* 7.8*  MG 1.9 2.0  PHOS 3.7 4.9*   Liver Function Tests  Recent Labs  01/29/15 0332 01/30/15 0326  ALBUMIN 2.3* 2.3*   No results for input(s): LIPASE, AMYLASE in the last 72 hours. Cardiac Enzymes No results for input(s): CKTOTAL, CKMB, CKMBINDEX, TROPONINI in the last 72 hours.  BNP: BNP (last 3 results)  Recent Labs  01/14/15 1440  BNP 2160.6*    ProBNP (last 3 results)  Recent Labs  03/25/14 0918  PROBNP 284.0*     D-Dimer No results for input(s): DDIMER in the last 72 hours. Hemoglobin A1C No results for input(s): HGBA1C in the last 72 hours. Fasting Lipid Panel No results for input(s): CHOL, HDL, LDLCALC, TRIG, CHOLHDL, LDLDIRECT in the last 72 hours. Thyroid Function Tests No results for input(s): TSH, T4TOTAL, T3FREE, THYROIDAB in the last 72 hours.  Invalid input(s): FREET3  Imaging/Studies:  No results found.   Medications:     Scheduled Medications: . antiseptic oral rinse  7 mL Mouth Rinse BID  . aspirin  81 mg Oral Daily  . atorvastatin  10 mg Oral q1800  . calcium acetate  667 mg Oral TID WC  . carvedilol  12.5 mg Oral BID WC  .  darbepoetin (ARANESP) injection - NON-DIALYSIS  100 mcg Subcutaneous Q Fri-1800  . dextromethorphan-guaiFENesin  1 tablet Oral BID  . feeding supplement (NEPRO CARB STEADY)  237 mL Oral Q24H  . heparin subcutaneous  5,000 Units Subcutaneous 3 times per day  . insulin aspart  0-5 Units Subcutaneous QHS  . insulin aspart  0-9 Units Subcutaneous TID WC  . insulin aspart  4 Units Subcutaneous TID WC  . insulin glargine  10 Units Subcutaneous Daily  . latanoprost  1 drop Both Eyes QHS  . losartan  50 mg Oral QHS  . salmeterol  1 puff Inhalation BID    Infusions:    PRN Medications: acetaminophen (TYLENOL) oral liquid 160 mg/5 mL, acetaminophen, GLUCERNA,  ipratropium-albuterol   Assessment/Plan   Pamela [name] is a 64 y.o. female with history of CAD, DM, COPD, HTN, CKD stage IV, and hyperlipidemia here with hypoxic respiratory failure requiring intubation and sepsis from an unknown source, acute on chronic renal failure requiring HD, and acute on chronic systolic and diastolic heart failure.  1. Septic Shock: Reason for admission. - From presumed UTI s/p renal bx despite negative Urine Cx and UA - Finished course of rocephin. Blood Cx negative. - WBC 7.2 01/30/15 2. Acute renal failure on CKD Stage IV (Baseline Cr High 2s).  - Now requires HD. M/W/F schedule. - Sodium back down to 119.  Should improve with dialysis today. 3. Acute respiratory failure with COPD:  - Required short term intubation but now stable on 02 via [name]. - Room air sats in 80s. Cardiac rehab to see and assess need for home 02. - Finished course of prednisone and rocephin. - Back on home COPD meds. 4. Chronic systolic HF, EF 25-30%, ICM:  - Volume status elevated over weekend with M/W/F dialysis schedule. - Reduced from 35-40% on echo 11/15 and cath 12/15.  - May be component of stress/sepsis CMP, will need follow up Echo outpatient for reassessment. - Continue losartan and Coreg. 5. CAD - Last cath 12/15. Severe disease first diagonal, medical treatment due to small caliber. Widely patent LAD stent, non-critical circumflex and RC disease. LVEF 35-40%.  - Troponin negative on admission. - Continue ASA and statin 6. DM - Per primary team  Length of Stay: 16  Pamela Freer PA-C  Advanced Heart Failure Team Pager [phone] (M-F; 7a - 4p)  Please contact [facility] for night-coverage after hours (4p -7a ) and weekends on [email or web address]  Patient seen with PA, agree with the above note.  No changes from our standpoint today.  For cardiomyopathy, would send home on Coreg 12.5 mg bid and losartan 50 mg daily. Continue ASA 81 and statin for CAD.  She  can followup in cardiology office after discharge.  She will need echo in a couple of months to see if EF improves (?component of stress/septic cardiomyopathy).  We will sign off, call with questions.   Pamela [name] 01/30/2015 4:13 PM

## 2015-01-30 NOTE — Care Management Important Message (Signed)
Important Message  Patient Details  Name: Pamela GAUTREAUX MRN: [id number] Date of Birth: 1950-07-12   Medicare Important Message Given:  Yes-fourth notification given    Orson Aloe 01/30/2015, 9:52 AM

## 2015-01-30 NOTE — Progress Notes (Signed)
CARDIAC REHAB PHASE I   Went to ambulate with pt, pt declines ambulation at this time. Pt states she just returned from dialysis and is too tired to walk. Pt states she walked yesterday with nursing staff and will try to do so again later today. Will follow-up tomorrow.   Joylene Grapes, RN, BSN 01/30/2015 1:49 PM

## 2015-01-30 NOTE — Progress Notes (Signed)
PT Cancellation Note  Patient Details Name: Pamela [name] MRN: [id number] DOB: [birth year]   Cancelled Treatment:    Reason Eval/Treat Not Completed: Patient declined, no reason specified (pt states she is getting ready to go to HD and declined any mobility at this time due to fatigue. Pt encouraged to mobilize with nursing later today)   [name] 01/30/2015, 8:53 AM [name], PT [phone]

## 2015-01-30 NOTE — Progress Notes (Signed)
Patient ID: Pamela [name], female   DOB: Dec 30, 1950, 64 y.o.   MRN: [id number]  [facility] Progress Note   Subjective:    Seen in dialysis  Feeling "pretty good" Has tolerated today's treatment well Still waiting on outpt HD acceptance at [facility]    Objective:   BP 149/70 mmHg  Pulse 92  Temp(Src) 98.6 F (37 C) (Oral)  Resp 18  Ht 5\' 1"  (1.549 m)  Wt 61.2 kg (134 lb 14.7 oz)  BMI 25.51 kg/m2  SpO2 97%  Intake/Output Summary (Last 24 hours) at 01/30/15 1217 Last data filed at 01/30/15 0858  Gross per 24 hour  Intake   1600 ml  Output    954 ml  Net    646 ml   Weight change: 1.769 kg (3 lb 14.4 oz)  Physical Exam: Gen: Looks well - seen in HD Pale, sallow complected  appearing female, very pleasant SUO:RVIFB regular in rate and rhythm, S1 and S2 normal No S3 or S4. Soft murmur LSB  No diastolic murmur Resp:CTA anteriorly  PPH:KFEX, flat, nontender Ext: trace LE edema RIJ TDC in use   Labs: BMET  Recent Labs Lab 01/24/15 0302 01/25/15 0550 01/26/15 0410 01/27/15 0448 01/28/15 0514 01/29/15 0332 01/30/15 0326  NA 123* 118* 129* 126*  125* 126* 125* 119*  K 4.4 4.3 3.6 3.9  3.9 4.1 4.2 4.4  CL 88* 84* 93* 91*  91* 93* 93* 86*  CO2 26 24 26 26  26 26 25 23   GLUCOSE 143* 126* 203* 203*  203* 181* 204* 195*  BUN 36* 39* 17 22*  22* 15 22* 32*  CREATININE 5.06* 5.54* 3.08* 4.31*  4.33* 2.70* 3.59* 4.45*  CALCIUM 7.6* 7.6* 7.5* 7.7*  7.7* 7.5* 8.0* 7.8*  PHOS 4.9* 5.3* 3.7 3.7 3.2 3.7 4.9*     Recent Labs Lab 01/28/15 1105 01/30/15 0326  WBC 9.8 7.2  HGB 7.9* 7.5*  HCT 24.6* 23.3*  MCV 95.7 93.2  PLT 207 186   Medications:    . antiseptic oral rinse  7 mL Mouth Rinse BID  . aspirin  81 mg Oral Daily  . atorvastatin  10 mg Oral q1800  . calcium acetate  667 mg Oral TID WC  . carvedilol  12.5 mg Oral BID WC  . darbepoetin (ARANESP) injection - NON-DIALYSIS  100 mcg Subcutaneous Q Fri-1800  . dextromethorphan-guaiFENesin   1 tablet Oral BID  . feeding supplement (NEPRO CARB STEADY)  237 mL Oral Q24H  . heparin subcutaneous  5,000 Units Subcutaneous 3 times per day  . insulin aspart  0-5 Units Subcutaneous QHS  . insulin aspart  0-9 Units Subcutaneous TID WC  . insulin aspart  4 Units Subcutaneous TID WC  . insulin glargine  10 Units Subcutaneous Daily  . latanoprost  1 drop Both Eyes QHS  . losartan  50 mg Oral QHS  . salmeterol  1 puff Inhalation BID    Assessment/ Plan:    1. 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 Continue on a Monday/Wednesday/Friday dialysis schedule while here at the hospital. For permanenet access on Tuesday. CLIP process underway to identify outpt unit 2. Sepsis- culture data negative to date-completed empiric antibiotic course for management of sepsis 3. Anemia- on ESA with low hemoglobin - 7.5 today.No iron studies on the chart since July. Will send in HD. 4. Secondary hyperparathyroidism-  last PTH 102 and not on vit D (goal for stage 5 CKD 150-300), phosphorus levels well controlled on low-dose binder 5. HTN/volume- BP fairly controlled on current meds 6. Hyponatremia: Due to impaired free water excretion with ESRD-I suspect drinking xs amouts of water. Needs to be more rigid about water restriction.    Camille Bal, MD [facility] [phone] Pager 01/30/2015, 12:18 PM

## 2015-01-31 ENCOUNTER — Inpatient Hospital Stay (HOSPITAL_COMMUNITY): Payer: Medicare Other | Admitting: Anesthesiology

## 2015-01-31 ENCOUNTER — Encounter (HOSPITAL_COMMUNITY)
Admission: EM | Disposition: A | Discharge status: Home / Self Care | Payer: Self-pay | Source: Home / Self Care | Attending: Internal Medicine

## 2015-01-31 ENCOUNTER — Inpatient Hospital Stay (HOSPITAL_COMMUNITY): Payer: Medicare Other

## 2015-01-31 HISTORY — PX: AV FISTULA PLACEMENT: SHX1204

## 2015-01-31 LAB — CBC
HEMATOCRIT: 25.2 % — AB (ref 36.0–46.0)
Hemoglobin: 7.9 g/dL — ABNORMAL LOW (ref 12.0–15.0)
MCH: 30.3 pg (ref 26.0–34.0)
MCHC: 31.3 g/dL (ref 30.0–36.0)
MCV: 96.6 fL (ref 78.0–100.0)
Platelets: 186 10*3/uL (ref 150–400)
RBC: 2.61 MIL/uL — AB (ref 3.87–5.11)
RDW: 18.6 % — ABNORMAL HIGH (ref 11.5–15.5)
WBC: 6.7 10*3/uL (ref 4.0–10.5)

## 2015-01-31 LAB — RENAL FUNCTION PANEL
ALBUMIN: 2.3 g/dL — AB (ref 3.5–5.0)
Anion gap: 8 (ref 5–15)
BUN: 20 mg/dL (ref 6–20)
CHLORIDE: 96 mmol/L — AB (ref 101–111)
CO2: 28 mmol/L (ref 22–32)
CREATININE: 2.99 mg/dL — AB (ref 0.44–1.00)
Calcium: 8 mg/dL — ABNORMAL LOW (ref 8.9–10.3)
GFR calc Af Amer: 18 mL/min — ABNORMAL LOW (ref >60)
GFR, EST NON AFRICAN AMERICAN: 15 mL/min — AB (ref >60)
GLUCOSE: 219 mg/dL — AB (ref 65–99)
POTASSIUM: 3.7 mmol/L (ref 3.5–5.1)
Phosphorus: 4 mg/dL (ref 2.5–4.6)
Sodium: 132 mmol/L — ABNORMAL LOW (ref 135–145)

## 2015-01-31 LAB — GLUCOSE, CAPILLARY
GLUCOSE-CAPILLARY: 177 mg/dL — AB (ref 65–99)
GLUCOSE-CAPILLARY: 232 mg/dL — AB (ref 65–99)
Glucose-Capillary: 210 mg/dL — ABNORMAL HIGH (ref 65–99)
Glucose-Capillary: 174 mg/dL — ABNORMAL HIGH (ref 65–99)
Glucose-Capillary: 164 mg/dL — ABNORMAL HIGH (ref 65–99)
Glucose-Capillary: 314 mg/dL — ABNORMAL HIGH (ref 65–99)

## 2015-01-31 LAB — MAGNESIUM: MAGNESIUM: 2 mg/dL (ref 1.7–2.4)

## 2015-01-31 SURGERY — ARTERIOVENOUS (AV) FISTULA CREATION
Anesthesia: Monitor Anesthesia Care | Site: Arm Upper | Laterality: Left

## 2015-01-31 MED ORDER — FENTANYL CITRATE (PF) 250 MCG/5ML IJ SOLN
INTRAMUSCULAR | Status: AC
  Filled 2015-01-31: qty 5

## 2015-01-31 MED ORDER — FENTANYL CITRATE (PF) 100 MCG/2ML IJ SOLN: 25.0000 ug | INTRAMUSCULAR | Status: DC | PRN

## 2015-01-31 MED ORDER — MIDAZOLAM HCL 2 MG/2ML IJ SOLN
INTRAMUSCULAR | Status: AC
  Filled 2015-01-31: qty 2

## 2015-01-31 MED ORDER — PROMETHAZINE HCL 25 MG/ML IJ SOLN: 6.2500 mg | INTRAMUSCULAR | Status: DC | PRN

## 2015-01-31 MED ORDER — CALCIUM ACETATE (PHOS BINDER) 667 MG PO CAPS
667.0000 mg | ORAL_CAPSULE | Freq: Three times a day (TID) | ORAL | Status: DC
  Administered 2015-01-31 – 2015-02-01 (×3): 667 mg via ORAL
  Filled 2015-01-31 (×2): qty 1

## 2015-01-31 MED ORDER — RENA-VITE PO TABS
1.0000 | ORAL_TABLET | Freq: Every day | ORAL | Status: DC
  Administered 2015-01-31: 1 via ORAL
  Filled 2015-01-31: qty 1

## 2015-01-31 MED ORDER — CEFAZOLIN SODIUM-DEXTROSE 2-3 GM-% IV SOLR
INTRAVENOUS | Status: DC | PRN
  Administered 2015-01-31: 2 g via INTRAVENOUS

## 2015-01-31 MED ORDER — PROPOFOL INFUSION 10 MG/ML OPTIME
INTRAVENOUS | Status: DC | PRN
  Administered 2015-01-31: 50 ug/kg/min via INTRAVENOUS
  Administered 2015-01-31: 75 ug/kg/min via INTRAVENOUS

## 2015-01-31 MED ORDER — ONDANSETRON HCL 4 MG/2ML IJ SOLN
INTRAMUSCULAR | Status: DC | PRN
  Administered 2015-01-31: 4 mg via INTRAVENOUS

## 2015-01-31 MED ORDER — SODIUM CHLORIDE 0.9 % IV SOLN
125.0000 mg | INTRAVENOUS | Status: DC
  Administered 2015-02-01: 125 mg via INTRAVENOUS
  Filled 2015-01-31 (×2): qty 10

## 2015-01-31 MED ORDER — CEFAZOLIN SODIUM-DEXTROSE 2-3 GM-% IV SOLR
INTRAVENOUS | Status: AC
  Filled 2015-01-31: qty 50

## 2015-01-31 MED ORDER — LIDOCAINE HCL (PF) 1 % IJ SOLN
INTRAMUSCULAR | Status: AC
  Filled 2015-01-31: qty 30

## 2015-01-31 MED ORDER — FENTANYL CITRATE (PF) 100 MCG/2ML IJ SOLN
INTRAMUSCULAR | Status: DC | PRN
  Administered 2015-01-31: 50 ug via INTRAVENOUS

## 2015-01-31 MED ORDER — SODIUM CHLORIDE 0.9 % IV SOLN
INTRAVENOUS | Status: DC | PRN
  Administered 2015-01-31: 10:00:00

## 2015-01-31 MED ORDER — 0.9 % SODIUM CHLORIDE (POUR BTL) OPTIME
TOPICAL | Status: DC | PRN
  Administered 2015-01-31: 1000 mL

## 2015-01-31 MED ORDER — SODIUM CHLORIDE 0.9 % IV SOLN
INTRAVENOUS | Status: DC | PRN
  Administered 2015-01-31: 10:00:00 via INTRAVENOUS

## 2015-01-31 MED ORDER — LIDOCAINE HCL (PF) 1 % IJ SOLN
INTRAMUSCULAR | Status: DC | PRN
  Administered 2015-01-31: 3 mL

## 2015-01-31 MED ORDER — HEPARIN SODIUM (PORCINE) 1000 UNIT/ML DIALYSIS
40.0000 [IU]/kg | INTRAMUSCULAR | Status: DC | PRN
  Filled 2015-01-31: qty 3

## 2015-01-31 SURGICAL SUPPLY — 36 items
ARMBAND PINK RESTRICT EXTREMIT (MISCELLANEOUS) ×3 IMPLANT
CANISTER SUCTION 2500CC (MISCELLANEOUS) ×3 IMPLANT
CLIP TI MEDIUM 6 (CLIP) ×3 IMPLANT
CLIP TI WIDE RED SMALL 6 (CLIP) ×3 IMPLANT
COVER PROBE W GEL 5X96 (DRAPES) ×3 IMPLANT
DECANTER SPIKE VIAL GLASS SM (MISCELLANEOUS) IMPLANT
ELECT REM PT RETURN 9FT ADLT (ELECTROSURGICAL) ×3
ELECTRODE REM PT RTRN 9FT ADLT (ELECTROSURGICAL) ×1 IMPLANT
GLOVE BIO SURGEON STRL SZ 6.5 (GLOVE) ×4 IMPLANT
GLOVE BIO SURGEON STRL SZ7 (GLOVE) ×6 IMPLANT
GLOVE BIO SURGEONS STRL SZ 6.5 (GLOVE) ×2
GLOVE BIOGEL PI IND STRL 6.5 (GLOVE) ×2 IMPLANT
GLOVE BIOGEL PI IND STRL 7.0 (GLOVE) ×2 IMPLANT
GLOVE BIOGEL PI IND STRL 7.5 (GLOVE) ×1 IMPLANT
GLOVE BIOGEL PI INDICATOR 6.5 (GLOVE) ×4
GLOVE BIOGEL PI INDICATOR 7.0 (GLOVE) ×4
GLOVE BIOGEL PI INDICATOR 7.5 (GLOVE) ×2
GLOVE SURG SS PI 7.0 STRL IVOR (GLOVE) ×3 IMPLANT
GOWN STRL REUS W/ TWL LRG LVL3 (GOWN DISPOSABLE) ×3 IMPLANT
GOWN STRL REUS W/ TWL XL LVL3 (GOWN DISPOSABLE) ×1 IMPLANT
GOWN STRL REUS W/TWL LRG LVL3 (GOWN DISPOSABLE) ×6
GOWN STRL REUS W/TWL XL LVL3 (GOWN DISPOSABLE) ×2
KIT BASIN OR (CUSTOM PROCEDURE TRAY) ×3 IMPLANT
KIT ROOM TURNOVER OR (KITS) ×3 IMPLANT
LIQUID BAND (GAUZE/BANDAGES/DRESSINGS) ×3 IMPLANT
NS IRRIG 1000ML POUR BTL (IV SOLUTION) ×3 IMPLANT
PACK CV ACCESS (CUSTOM PROCEDURE TRAY) ×3 IMPLANT
PAD ARMBOARD 7.5X6 YLW CONV (MISCELLANEOUS) ×6 IMPLANT
SPONGE SURGIFOAM ABS GEL 100 (HEMOSTASIS) IMPLANT
SUT MNCRL AB 4-0 PS2 18 (SUTURE) ×3 IMPLANT
SUT PROLENE 6 0 BV (SUTURE) IMPLANT
SUT PROLENE 7 0 BV 1 (SUTURE) ×6 IMPLANT
SUT VIC AB 3-0 SH 27 (SUTURE) ×2
SUT VIC AB 3-0 SH 27X BRD (SUTURE) ×1 IMPLANT
UNDERPAD 30X30 INCONTINENT (UNDERPADS AND DIAPERS) ×3 IMPLANT
WATER STERILE IRR 1000ML POUR (IV SOLUTION) IMPLANT

## 2015-01-31 NOTE — Interval H&P Note (Signed)
History and Physical Interval Note:  01/31/2015 8:42 AM  Pamela [name]  has presented today for surgery, with the diagnosis of End Stage Renal Disease N18.6  The various methods of treatment have been discussed with the patient and family. After consideration of risks, benefits and other options for treatment, the patient has consented to  Procedure(s): ARTERIOVENOUS (AV) FISTULA CREATION-LEFT (Left) as a surgical intervention .  The patient's history has been reviewed, patient examined, no change in status, stable for surgery.  I have reviewed the patient's chart and labs.  Questions were answered to the patient's satisfaction.     Leonides Sake

## 2015-01-31 NOTE — Plan of Care (Signed)
Problem: Phase I Progression Outcomes Goal: Pt./family verbalizes plan of care Outcome: Progressing Now on Room Air.

## 2015-01-31 NOTE — Op Note (Signed)
OPERATIVE NOTE   PROCEDURE: left brachiocephalic arteriovenous fistula placement  PRE-OPERATIVE DIAGNOSIS: acute on chronic renal failrue  POST-OPERATIVE DIAGNOSIS: same as above   SURGEON: Leonides Sake, MD  ASSISTANT(S): Doreatha Massed, PAC   ANESTHESIA: local and MAC  ESTIMATED BLOOD LOSS: 50 cc  FINDING(S): 1.  Sclerotic cephalic vein 2.  Palpable thrill at end of case 3.  Dopplerable ulnar without dopplerable radial (no radial signal even with venous outflow occlusion)  SPECIMEN(S):  none  INDICATIONS:   Pamela [name] is a 64 y.o. female who presents with acute on chronic renal failure.  The patient is scheduled for left brachiocephalic arteriovenous fistula placement.  The patient is aware the risks include but are not limited to: bleeding, infection, steal syndrome, nerve damage, ischemic monomelic neuropathy, failure to mature, and need for additional procedures.  The patient is aware of the risks of the procedure and elects to proceed forward.  DESCRIPTION: After full informed written consent was obtained from the patient, the patient was brought back to the operating room and placed supine upon the operating table.  Prior to induction, the patient received IV antibiotics.   After obtaining adequate anesthesia, the patient was then prepped and draped in the standard fashion for a left arm access procedure.  I turned my attention first to identifying the patient's cephalic vein and brachial artery.  Using SonoSite guidance, the location of these vessels were marked out on the skin.   At this point, I injected local anesthetic to obtain a field block of the antecubitum.  In total, I injected about 5 mL of 1% lidocaine without epinephrine.  I made a transverse incision at the level of the antecubitum and dissected through the subcutaneous tissue and fascia to gain exposure of the brachial artery.  This was noted to be 2.5-3.0 mm in diameter externally.  This was dissected out  proximally and distally and controlled with vessel loops .  I then dissected out the cephalic vein.  This was noted to be 2.5-3 mm in diameter externally.  The distal segment of the vein was ligated with a  2-0 silk, and the vein was transected.  The proximal segment was interrogated with serial dilators.  The vein accepted up to a 4 mm dilator without any difficulty.  I then instilled the heparinized saline into the vein and clamped it.  At this point, I reset my exposure of the brachial artery and placed the artery under tension proximally and distally.  I made an arteriotomy with a #11 blade, and then I extended the arteriotomy with a Potts scissor.  I injected heparinized saline proximal and distal to this arteriotomy.  The vein was then sewn to the artery in an end-to-side configuration with a running stitch of 7-0 Prolene.  Prior to completing this anastomosis, I allowed the vein and artery to backbleed.  There was no evidence of clot from any vessels.  I completed the anastomosis in the usual fashion and then released all vessel loops and clamps.  There was a palpable thrill in the venous outflow, and there was no dopplerable radial artery signal.  I clamped the fistula and again there was no radial signal.  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  At this point, I irrigated out the surgical wound.  There was no further active bleeding.  The subcutaneous tissue was reapproximated with a running stitch of 3-0  Vicryl.  The skin was then reapproximated with a running subcuticular stitch of 4-0 Vicryl.  The skin was then cleaned, dried, and reinforced with Dermabond.  The patient tolerated this procedure well.    COMPLICATIONS: none  CONDITION: stable   Leonides Sake, MD [facility] Office: [phone] Pager: [phone]  01/31/2015, 10:45 AM

## 2015-01-31 NOTE — Progress Notes (Signed)
Nutrition Follow-up  INTERVENTION:  Continue Nepro Shake po once daily, each supplement provides 425 kcal and 19 grams protein Continue Rena-Vit daily  NUTRITION DIAGNOSIS:   Increased nutrient needs related to chronic illness and wound healing as evidenced by estimated needs.  Ongoing  GOAL:   Patient will meet greater than or equal to 90% of their needs  Being met  MONITOR:   Diet advancement, PO intake, Labs, Weight trends  REASON FOR ASSESSMENT:   Consult Enteral/tube feeding initiation and management  ASSESSMENT:   Patient admitted on 9/3 with acute renal failure, septic shock and respiratory failure. Required intubation on 9/3 and was extubated on 9/4.  Pt reports having a good appetite and eating most of meals. She has had 3 treatments of HD and reports feeling well. Per nursing notes pt has been eating 100% of most meals and drinking Nepro Shake once daily. RD answered pt/family questions regarding renal diet. "Choose-a-Meal" booklet/guide to renal diet has been provided.  In the past week, pt's weight dropped to 112 lbs and increased again to 134 lbs; reported usual body weight PTA was 116 lbs.   Labs: low hemoglobin, low sodium, low chloride, low calcium, elevated glucose  Diet Order:  Diet renal with fluid restriction Fluid restriction:: 1200 mL Fluid; Room service appropriate?: Yes; Fluid consistency:: Thin  Skin:  Wound (see comment) (stage II pressure ulcer on Right buttocks, closed incision on left arm)  Last BM:  9/19  Height:   Ht Readings from Last 1 Encounters:  01/25/15 $RemoveB'5\' 1"'PzSpTBmD$  (1.549 m)    Weight:   Wt Readings from Last 1 Encounters:  01/31/15 131 lb 3.2 oz (59.512 kg)    Ideal Body Weight:  47.7 kg  BMI:  Body mass index is 24.8 kg/(m^2).  Estimated Nutritional Needs:   Kcal:  1700-1900  Protein:  70-80 grams  Fluid:  1.7-1.9 L/day  EDUCATION NEEDS:   No education needs identified at this time  [place],  LDN Inpatient Clinical Dietitian Pager: [phone] After Hours Pager: [phone]

## 2015-01-31 NOTE — Discharge Instructions (Signed)
° ° °  01/31/2015 Pamela [name] [id number] 10-31-1950  Surgeon(s): Fransisco Hertz, MD  Procedure(s): ARTERIOVENOUS FISTULA CREATION-LEFT BRACHIO-CEPHALIC   x Do not stick fistula for 12 weeks

## 2015-01-31 NOTE — Anesthesia Preprocedure Evaluation (Addendum)
Anesthesia Evaluation  Patient identified by MRN, date of birth, ID band Patient awake    Reviewed: Allergy & Precautions, NPO status , Patient's Chart, lab work & pertinent test results  Airway Mallampati: II  TM Distance: >3 FB Neck ROM: Full    Dental no notable dental hx.    Pulmonary COPD, former smoker,    Pulmonary exam normal breath sounds clear to auscultation       Cardiovascular hypertension, Pt. on medications + CAD and + Peripheral Vascular Disease   Rhythm:Regular Rate:Normal + Systolic murmurs Left ventricle: LVEF is severely depressed.  ------------------------------------------------------------------- Aortic valve: No obvious vegetaion. Mildly calcified leaflets. Doppler: There was no regurgitation.  ------------------------------------------------------------------- Mitral valve:  Structurally normal valve.  No evidence of vegetation. Doppler: There was mild regurgitation.  ------------------------------------------------------------------- Left atrium:  No evidence of thrombus in the atrial cavity or appendage.  ------------------------------------------------------------------- Atrial septum: No PFO as tested by color doppler or with injection of agitated saline. No defect or patent foramen ovale was identified.  ------------------------------------------------------------------- Pulmonic valve:  Structurally normal valve.  Cusp separation was   Neuro/Psych negative neurological ROS  negative psych ROS   GI/Hepatic negative GI ROS, Neg liver ROS,   Endo/Other  diabetes  Renal/GU ESRFRenal disease  negative genitourinary   Musculoskeletal negative musculoskeletal ROS (+)   Abdominal   Peds negative pediatric ROS (+)  Hematology negative hematology ROS (+)   Anesthesia Other Findings   Reproductive/Obstetrics negative OB ROS                             Anesthesia Physical Anesthesia Plan  ASA: IV  Anesthesia Plan: MAC   Post-op Pain Management:    Induction: Intravenous  Airway Management Planned: Simple Face Mask  Additional Equipment:   Intra-op Plan:   Post-operative Plan: Extubation in OR  Informed Consent: I have reviewed the patients History and Physical, chart, labs and discussed the procedure including the risks, benefits and alternatives for the proposed anesthesia with the patient or authorized representative who has indicated his/her understanding and acceptance.   Dental advisory given  Plan Discussed with: CRNA and Surgeon  Anesthesia Plan Comments:         Anesthesia Quick Evaluation

## 2015-01-31 NOTE — Anesthesia Postprocedure Evaluation (Signed)
  Anesthesia Post-op Note  Patient: Pamela [name]  Procedure(s) Performed: Procedure(s) (LRB): ARTERIOVENOUS FISTULA CREATION-LEFT BRACHIO-CEPHALIC  (Left)  Patient Location: PACU  Anesthesia Type: MAC  Level of Consciousness: awake and alert   Airway and Oxygen Therapy: Patient Spontanous Breathing  Post-op Pain: mild  Post-op Assessment: Post-op Vital signs reviewed, Patient's Cardiovascular Status Stable, Respiratory Function Stable, Patent Airway and No signs of Nausea or vomiting  Last Vitals:  Filed Vitals:   01/31/15 1115  BP: 135/51  Pulse: 84  Temp:   Resp: 20    Post-op Vital Signs: stable   Complications: No apparent anesthesia complications

## 2015-01-31 NOTE — Progress Notes (Addendum)
Patient ID: Pamela [name], female   DOB: [birth year], 64 y.o.   MRN: [id number]  [facility] Progress Note   Subjective:    Had dialysis yesterday well tolerated On deck for AVF today by Dr. Imogene Burn Asking questions about her fluid restriction Was told could have "42 oz/day of fluids" by somebody - told her NO MORE THAN 32 oz (and discussed her hyponatremia with her in this vein)   Objective:   BP 148/91 mmHg  Pulse 91  Temp(Src) 98.1 F (36.7 C) (Oral)  Resp 18  Ht  (1.549 m)  Wt 59.512 kg (131 lb 3.2 oz)  BMI 24.80 kg/m2  SpO2 96%  Intake/Output Summary (Last 24 hours) at 01/31/15 0851 Last data filed at 01/31/15 0800  Gross per 24 hour  Intake    455 ml  Output   2602 ml  Net  -2147 ml   Weight change: 0.962 kg (2 lb 1.9 oz)  Physical Exam: Pale, sallow complected  appearing female, very pleasant, lying in bed WUJ:WJXBJ regular in rate and rhythm S1 and S2 normal  No S3 or S4.  Soft murmur LSB  No diastolic murmur Resp:CTA anteriorly Breath sounds somewhat diminished at bases posteriorly YNW:GNFA, flat, nontender Ext: trace LE edema RIJ TDC dressing dry  Labs: BMET  Recent Labs Lab 01/25/15 0550 01/26/15 0410 01/27/15 0448 01/28/15 0514 01/29/15 0332 01/30/15 0326 01/31/15 0514  NA 118* 129* 126*  125* 126* 125* 119* 132*  K 4.3 3.6 3.9  3.9 4.1 4.2 4.4 3.7  CL 84* 93* 91*  91* 93* 93* 86* 96*  CO2 GLUCOSE 126* 203* 203*  203* 181* 204* 195* 219*  BUN 39* 17 22*  22* 15 22* 32* 20  CREATININE 5.54* 3.08* 4.31*  4.33* 2.70* 3.59* 4.45* 2.99*  CALCIUM 7.6* 7.5* 7.7*  7.7* 7.5* 8.0* 7.8* 8.0*  PHOS 5.3* 3.7 3.7 3.2 3.7 4.9* 4.0     Recent Labs Lab 01/28/15 1105 01/30/15 0326  WBC 9.8 7.2  HGB 7.9* 7.5*  HCT 24.6* 23.3*  MCV 95.7 93.2  PLT 207 186   Results for JAMALA, KOHEN (MRN [id number]) as of 01/31/2015 08:56  Ref. Range 01/30/2015 12:30  Iron Latest Ref Range: 28-170 ug/dL 30   UIBC Latest Units: ug/dL 469  TIBC Latest Ref Range: 250-450 ug/dL 629 (L)  Saturation Ratios Latest Ref Range: 10.4-31.8 % 14  Ferritin Latest Ref Range: 11-307 ng/mL 259    Medications:    . antiseptic oral rinse  7 mL Mouth Rinse BID  . aspirin  81 mg Oral Daily  . atorvastatin  10 mg Oral q1800  . calcium acetate  667 mg Oral TID WC  . carvedilol  12.5 mg Oral BID WC  . darbepoetin (ARANESP) injection - NON-DIALYSIS  100 mcg Subcutaneous Q Fri-1800  . dextromethorphan-guaiFENesin  1 tablet Oral BID  . feeding supplement (NEPRO CARB STEADY)  237 mL Oral Q24H  . heparin subcutaneous  5,000 Units Subcutaneous 3 times per day  . insulin aspart  0-5 Units Subcutaneous QHS  . insulin aspart  0-9 Units Subcutaneous TID WC  . insulin aspart  5 Units Subcutaneous TID WC  . insulin glargine  12 Units Subcutaneous Daily  . latanoprost  1 drop Both Eyes QHS  . losartan  50 mg Oral QHS  . salmeterol  1 puff Inhalation BID    Assessment/ Plan:    1. 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 Continue on a Monday/Wednesday/Friday dialysis schedule while here at the hospital. For permanent access placement this morning, no word yet from [facility] on acceptance for outpt slot.  2. Sepsis- culture data negative to date-completed empiric antibiotic course for management of sepsis 3. Anemia- on ESA with low hemoglobin - 7.5 yesterday. TSat low. Begin 10 dose Fe load with hemodialysis.   4. Secondary hyperparathyroidism- last PTH 102 and not on vit D (goal for stage 5 CKD 150-300), phosphorus levels well controlled on low-dose binder 5. HTN/volume- BP fairly controlled on current meds 6. Hyponatremia: Due to impaired free water excretion with ESRD. Drinking xs fluids. Discussed 32 oz fluid restriction with pt and family today.   Camille Bal,  MD [facility] [phone] Pager 01/31/2015, 8:51 AM  Addendum: Pt accepted at [facility]) for TTS 12Noon chair time. Can start there on Thursday.  Will need to arrive by 11:30 AM to sign consents.  I think fine to wait until Thursday for her next treatment (had HD yesterday)  Camille Bal, MD [facility] [phone] Pager 01/31/2015, 1:08 PM

## 2015-01-31 NOTE — Progress Notes (Signed)
PROGRESS NOTE  Pamela [name] IFO:[id number] DOB: November 03, 1950 DOA: 01/14/2015 PCP: Georgann Housekeeper, MD  Brief History pleasant 65 year old Caucasian female with history of hypertensive nephrosclerosis with CKD stage IV under the care of Dr. Kathrene Bongo, CAD with chronic combined diastolic and systolic heart failure EF around 30%, mild AS, essential hypertension, dyslipidemia who underwent an outpatient kidney biopsy on 01/12/2015 thereafter presented to the ER on 01/14/2015 for decreased mentation due to uremia. She was found to be in acute renal failure with hyperkalemia, developed respiratory failure due to combination of fluid overload along with failure to protect airway due to uremia. She was intubated and kept in ICU. There was concern of sepsis from possible urinary tract infection. Patient was started on antibiotics and ultimately finished her regimen. She was seen by cardiology and nephrology. Right femoral Vas-Cath was placed and she was started on dialysis this admission. She underwent TTE along with TEE both confirming severe combined systolic and diastolic CHF however no evidence of endocarditis.  She was stabilized and transferred to hospitalist service on day 7 of her hospital stay date 01/21/2015.  Unfortunately her renal function continued to decline. On 01/24/2015 it was decided that she would likely require permanent dialysis. IR was been consulted and right IJ PermCath was placed. Vascular surgery was consulted for a permanent access. Plans for fistula on 01/31/2015 Assessment/Plan: Acute hypoxic respiratory failure  -due to combination of toxic encephalopathy failure to protect airway, fluid overload.  -Requiring intubation and extubation 2, finally extubated on 01/20/2015. -Try to titrate off oxygen, supportive care with Neb treatments as needed. -Finished course of prednisone and ceftriaxone -01/26/15--stable on RA  Progressive CKD IV-->New ESRD  -Renal biopsy  shows severe nephrosclerosis with markers indicative of chronicity/tubulointerstitial fibrosis -was dialyzed via right femoral Vas-Cath earlier during admission -01/24/2015 it was decided that she would likely require permanent dialysis.  -01/25/15--PermCath placement and HD -VVS for L arm fistula creation-- 01/31/15 -await CLIP process for outpt dialysis chair -gaining weight on non-dialysis days--will need more education on fluid restriction  Acute on chronic systolic and diastolic CHF /ischemic cardiomyopathy.  -EF 25-30%--- Reduced from 35-40% on echo 11/15 and cath 12/15.  -Underwent TTE and TEE - Baseline weight appears to be 112-114 -Continue Coreg increased to 12.5 bid 01/26/15. -ASA 81 mg daily -continue statin -losartan started; stopped Bi-Dil -gaining weight on non-dialysis days--will need more education on fluid restriction  Toxic/Metabolic encephalopathy due to uremia.  -Resolved   COPD.  -No wheezing, nebulizer treatments as needed -stable on RA -serevent restarted   Dyslipidemia. -Continue Lipitor  Left cephalic vein SVT  -noted on ultrasound done in ICU on 01/18/2015.  -Supportive care.  -Discussed with vascular surgery-->No further evaluation or treatment for SVT.   DM type II.  -01/21/2015 hemoglobin A1c 6.8 -Increase Lantus 12 units daily -Continue NovoLog sliding scale -CBGs increasing with increasing po intake -Increase novolog 5 units premeal -d/c amaryl -will not restart metformin after d/c  Hyponatremia -secondary to renal failure and fluid overload -improvement with HD  Questionable sepsis.  -resolved.  -No clear source, all cultures negative,  -UA was clear, chest x-ray was clear, one of her sputum cultures grew Enterobacter cloacae -in ICU was tapered down to Rocephin which she finished on 01/21/2015.  -stable repeat 2 view chest x-ray on 01/21/2015.   Family Communication: Husband updated at beside Disposition Plan: Home  when medically stable and dialysis chair obtained      Procedures/Studies: Dg Chest  2 View  01/21/2015   CLINICAL DATA:  Cough.  EXAM: CHEST  2 VIEW  COMPARISON:  January 19, 2015.  FINDINGS: The heart size and mediastinal contours are within normal limits. Both lungs are clear. No pneumothorax is noted. Minimal bilateral pleural effusions are noted. The visualized skeletal structures are unremarkable.  IMPRESSION: Minimal bilateral pleural effusions. No other significant abnormality seen in the chest.   Electronically Signed   By: Lupita Raider, M.D.   On: 01/21/2015 20:27   US Renal  01/14/2015   CLINICAL DATA:  Renal failure. History of hypertension, COPD, diabetes.  EXAM: RENAL / URINARY TRACT ULTRASOUND COMPLETE  COMPARISON:  10/21/2014  FINDINGS: Right Kidney:  Length: 9.9 cm. Renal parenchyma is echogenic. Multiple small cysts are identified. The largest is seen in the lower pole region measuring 1.1 cm. No hydronephrosis.  Left Kidney:  Length: 10.2 cm. Echogenic renal parenchyma. Multiple small cysts are identified, largest in the lower pole region which measures 1.3 cm.  Bladder:  A Foley catheter decompresses the bladder.  Additional:  Note is made of a small amount of ascites.  IMPRESSION: 1. Echogenic kidneys bilaterally.  Bilateral renal cysts. 2. No hydronephrosis. 3. Small amount of ascites noted.   Electronically Signed   By: Norva Pavlov M.D.   On: 01/14/2015 17:44   US Biopsy  01/12/2015   INDICATION: Proteinuria of uncertain etiology. Please perform ultrasound-guided biopsy for tissue diagnostic purposes.  EXAM: ULTRASOUND GUIDED RENAL BIOPSY  COMPARISON:  Renal ultrasound - 10/21/2014  MEDICATIONS: Fentanyl 50 mcg IV; Versed 1 mg IV  ANESTHESIA/SEDATION: Total Moderate Sedation time  15 minutes  COMPLICATIONS: SIR level A: No therapy, no consequence.  Procedure complicated by development of a tiny, asymptomatic, non enlarging left-sided perinephric hematoma.  PROCEDURE:  Informed written consent was obtained from the patient after a discussion of the risks, benefits and alternatives to treatment. The patient understands and consents the procedure. A timeout was performed prior to the initiation of the procedure.  Ultrasound scanning was performed of the bilateral flanks. The inferior pole of the left kidney was selected for biopsy due to location and sonographic window. The procedure was planned. The operative site was prepped and draped in the usual sterile fashion. The overlying soft tissues were anesthetized with 1% lidocaine with epinephrine. A 17 gauge core needle biopsy device was advanced into the inferior cortex of the left kidney and 3 core biopsies were obtained under direct ultrasound guidance. Real time pathologic review confirmed adequate tissue acquisition. Images were saved for documentation purposes. The biopsy device was removed and hemostasis was obtained with manual compression.  Postprocedural scanning and demonstrated development of a very tiny perinephric hematoma which was noted to not enlarged on the acquired delayed postprocedural imaging (representative images 8 and 9). Post procedural scanning was negative for significant post procedural hemorrhage or additional complication. A dressing was placed. The patient tolerated the procedure well without immediate post procedural complication.  IMPRESSION: Technically successful ultrasound guided left renal biopsy.   Electronically Signed   By: Simonne Come M.D.   On: 01/12/2015 13:57   Ir Fluoro Guide Cv Line Right  01/26/2015   CLINICAL DATA:  Renal disease  EXAM: TUNNELED DIALYSIS CATHETER PLACEMENT, ULTRASOUND GUIDANCE FOR VASCULAR ACCESS  FLUOROSCOPY TIME:  12 seconds  MEDICATIONS AND MEDICAL HISTORY: Versed 0.5 mg, Fentanyl 25 mcg.  As antibiotic prophylaxis, Ancef was ordered pre-procedure and administered intravenously within one hour of incision.  ANESTHESIA/SEDATION: Moderate sedation time: 15 minutes  CONTRAST:  None  PROCEDURE: The procedure, risks, benefits, and alternatives were explained to the patient. Questions regarding the procedure were encouraged and answered. The patient understands and consents to the procedure.  The right neck was prepped with Betadine in a sterile fashion, and a sterile drape was applied covering the operative field. A sterile gown and sterile gloves were used for the procedure. 1% lidocaine into the skin and subcutaneous tissue. The right internal jugular vein was noted to be patent initially with ultrasound. Under sonographic guidance, a micropuncture needle was inserted into the right IJ vein (Ultrasound and fluoroscopic image documentation was performed). It was removed over an 018 wire which was upsized to an Amplatz. This was advanced into the IVC.  A small incision was made in the right upper chest. The tunneling device was utilized to advance the 23 centimeter tip to cuff catheter from the chest incision and out the neck incision. A peel-away sheath was advanced over the Amplatz wire. The leading edge of the catheter was then advanced through the peel-away sheath. The peel-away sheath was removed. It was flushed and instilled with heparin. The chest incision was closed with a 0 Prolene pursestring stitch. The neck incision was closed with a 4-0 Vicryl subcuticular stitch.  COMPLICATIONS: None  FINDINGS: The image demonstrates placement of a tunneled dialysis catheter with its tip in the right atrium.  IMPRESSION: Successful right IJ vein tunneled dialysis catheter with its tip in the right atrium.   Electronically Signed   By: Jolaine Click M.D.   On: 01/26/2015 12:45   Ir US Guide Vasc Access Right  01/26/2015   CLINICAL DATA:  Renal disease  EXAM: TUNNELED DIALYSIS CATHETER PLACEMENT, ULTRASOUND GUIDANCE FOR VASCULAR ACCESS  FLUOROSCOPY TIME:  12 seconds  MEDICATIONS AND MEDICAL HISTORY: Versed 0.5 mg, Fentanyl 25 mcg.  As antibiotic prophylaxis, Ancef was ordered  pre-procedure and administered intravenously within one hour of incision.  ANESTHESIA/SEDATION: Moderate sedation time: 15 minutes  CONTRAST:  None  PROCEDURE: The procedure, risks, benefits, and alternatives were explained to the patient. Questions regarding the procedure were encouraged and answered. The patient understands and consents to the procedure.  The right neck was prepped with Betadine in a sterile fashion, and a sterile drape was applied covering the operative field. A sterile gown and sterile gloves were used for the procedure. 1% lidocaine into the skin and subcutaneous tissue. The right internal jugular vein was noted to be patent initially with ultrasound. Under sonographic guidance, a micropuncture needle was inserted into the right IJ vein (Ultrasound and fluoroscopic image documentation was performed). It was removed over an 018 wire which was upsized to an Amplatz. This was advanced into the IVC.  A small incision was made in the right upper chest. The tunneling device was utilized to advance the 23 centimeter tip to cuff catheter from the chest incision and out the neck incision. A peel-away sheath was advanced over the Amplatz wire. The leading edge of the catheter was then advanced through the peel-away sheath. The peel-away sheath was removed. It was flushed and instilled with heparin. The chest incision was closed with a 0 Prolene pursestring stitch. The neck incision was closed with a 4-0 Vicryl subcuticular stitch.  COMPLICATIONS: None  FINDINGS: The image demonstrates placement of a tunneled dialysis catheter with its tip in the right atrium.  IMPRESSION: Successful right IJ vein tunneled dialysis catheter with its tip in the right atrium.   Electronically Signed   By: Jolaine Click  M.D.   On: 01/26/2015 12:45   Dg Chest Port 1 View  01/23/2015   CLINICAL DATA:  Shortness of breath  EXAM: PORTABLE CHEST - 1 VIEW  COMPARISON:  January 21, 2015  FINDINGS: The heart size and mediastinal  contours are within normal limits. There is no focal pneumonia or pulmonary edema. Minimal right pleural effusion is identified. The visualized skeletal structures are stable.  IMPRESSION: Minimal right pleural effusion.  No focal pneumonia.   Electronically Signed   By: Sherian Rein M.D.   On: 01/23/2015 09:40   Dg Chest Port 1 View  01/19/2015   CLINICAL DATA:  Renal failure.  EXAM: PORTABLE CHEST - 1 VIEW  COMPARISON:  01/17/2015.  FINDINGS: Endotracheal tube, left IJ line, NG tube in stable position. Heart size is stable. Bibasilar pulmonary infiltrates consistent pneumonia. Minimal interval improvement. No pleural effusion or pneumothorax.  IMPRESSION: 1. Lines and tubes in stable position. 2. Bibasilar infiltrates consistent with pneumonia. Minimal interval improvement.   Electronically Signed   By: Maisie Fus  Register   On: 01/19/2015 07:21   Dg Chest Port 1 View  01/17/2015   CLINICAL DATA:  Ventilator dependent respiratory failure. Followup left basilar atelectasis.  EXAM: PORTABLE CHEST - 1 VIEW  COMPARISON:  01/15/2015 and earlier.  FINDINGS: Endotracheal tube tip in satisfactory position approximately 4 cm above the carina. Nasogastric tube courses below the diaphragm into the stomach though its tip is not included on the image. Left jugular central venous catheter tip projects over the mid to lower SVC.  Cardiac silhouette normal in size, unchanged. Airspace opacities involving the lung bases bilaterally, right greater than left, progressive since the examination 2 days ago. Pulmonary vascularity normal. Possible small bilateral pleural effusions.  IMPRESSION: 1. Support apparatus satisfactory. 2. Pneumonia involving the lung bases, right greater than left, progressive since the examination 2 days ago. 3. Possible small bilateral pleural effusions.   Electronically Signed   By: Hulan Saas M.D.   On: 01/17/2015 11:28   Dg Chest Port 1 View  01/15/2015   CLINICAL DATA:  Septic shock, respiratory  failure and acute renal failure. Question left lower lobe pneumonia.  EXAM: PORTABLE CHEST - 1 VIEW  COMPARISON:  01/14/2015  FINDINGS: Endotracheal tube with tip 3.2 cm above the carina, left IJ central venous catheter with tip overlying the superior cavoatrial junction NG tube entering the stomach with tip off the field of view noted.  Left lower lung opacity has improved.  There is no evidence of pleural effusion or pneumothorax.  IMPRESSION: Improved left lower lung opacity, suggesting atelectasis.  No other significant change.   Electronically Signed   By: Harmon Pier M.D.   On: 01/15/2015 07:32   Dg Chest Port 1 View  01/14/2015   CLINICAL DATA:  Septic shock, respiratory failure and acute renal failure. Endotracheal tube and central line placement.  EXAM: PORTABLE CHEST - 1 VIEW  COMPARISON:  01/14/2015 and prior radiographs  FINDINGS: The cardiomediastinal silhouette is unchanged.  An endotracheal tube with tip 5.1 cm above the carina, left IJ central venous catheter with tip overlying the lower SVC and NG tube entering the stomach with tip off the field of view are noted.  Left lower lung airspace disease is present.  There is no evidence of pneumothorax or pleural effusion.  IMPRESSION: Left lower lung airspace disease/ pneumonia.  Support apparatus as described.  No evidence of pneumothorax.   Electronically Signed   By: Harmon Pier M.D.   On:  01/14/2015 16:38   Dg Chest Portable 1 View  01/14/2015   CLINICAL DATA:  Sepsis.  EXAM: PORTABLE CHEST - 1 VIEW  COMPARISON:  03/25/2014.  FINDINGS: Interval borderline enlargement of the cardiac silhouette and mild increase in prominence of the interstitial markings, accentuated by a decreased inspiration. Calcified granuloma in the right lower lung zone. No pleural fluid. Diffuse osteopenia.  IMPRESSION: Interval borderline cardiomegaly and mildly increased prominence of the interstitial markings, most likely due to a poor inspiration. No gross acute  abnormality.   Electronically Signed   By: Beckie Salts M.D.   On: 01/14/2015 15:21   Dg Abd Portable 1v  01/17/2015   CLINICAL DATA:  Nasogastric tube placement  EXAM: PORTABLE ABDOMEN - 1 VIEW  COMPARISON:  None.  FINDINGS: Orogastric tube with the tip projecting over the antrum of the stomach. There is no bowel dilatation to suggest obstruction. There is no evidence of pneumoperitoneum, portal venous gas or pneumatosis. There are no pathologic calcifications along the expected course of the ureters.The osseous structures are unremarkable.  IMPRESSION: Orogastric tube with the tip projecting over the antrum of the stomach.   Electronically Signed   By: Elige Ko   On: 01/17/2015 11:19        Subjective: Patient denies fevers, chills, headache, chest pain, dyspnea, nausea, vomiting, diarrhea, abdominal pain, dysuria, hematuria    Objective: Filed Vitals:   01/30/15 2158 01/31/15 0421 01/31/15 0758 01/31/15 0800  BP:  149/72 148/91   Pulse: 89 92 91   Temp:  99.1 F (37.3 C)  98.1 F (36.7 C)  TempSrc:  Oral  Oral  Resp: 18 20  18   Height:      Weight:  59.512 kg (131 lb 3.2 oz)    SpO2: 98% 94%  96%    Intake/Output Summary (Last 24 hours) at 01/31/15 5366 Last data filed at 01/31/15 0800  Gross per 24 hour  Intake    455 ml  Output   2602 ml  Net  -2147 ml   Weight change: 0.962 kg (2 lb 1.9 oz) Exam:   General:  Pt is alert, follows commands appropriately, not in acute distress  HEENT: No icterus, No thrush, No neck mass, /AT  Cardiovascular: RRR, S1/S2, no rubs, no gallops  Respiratory: Diminished breath sounds bilateral. Fine bibasilar crackles. No wheeze.  Abdomen: Soft/+BS, non tender, non distended, no guarding  Extremities: 1+LE edema, No lymphangitis, No petechiae, No rashes, no synovitis  Data Reviewed: Basic Metabolic Panel:  Recent Labs Lab 01/27/15 0448 01/28/15 0514 01/29/15 0332 01/30/15 0326 01/31/15 0514  NA 126*  125* 126* 125*  119* 132*  K 3.9  3.9 4.1 4.2 4.4 3.7  CL 91*  91* 93* 93* 86* 96*  CO2 26  26 26 25 23 28   GLUCOSE 203*  203* 181* 204* 195* 219*  BUN 22*  22* 15 22* 32* 20  CREATININE 4.31*  4.33* 2.70* 3.59* 4.45* 2.99*  CALCIUM 7.7*  7.7* 7.5* 8.0* 7.8* 8.0*  MG 2.0 1.9 1.9 2.0 2.0  PHOS 3.7 3.2 3.7 4.9* 4.0   Liver Function Tests:  Recent Labs Lab 01/27/15 0448 01/28/15 0514 01/29/15 0332 01/30/15 0326 01/31/15 0514  ALBUMIN 2.4* 2.4* 2.3* 2.3* 2.3*   No results for input(s): LIPASE, AMYLASE in the last 168 hours. No results for input(s): AMMONIA in the last 168 hours. CBC:  Recent Labs Lab 01/28/15 1105 01/30/15 0326  WBC 9.8 7.2  HGB 7.9* 7.5*  HCT 24.6* 23.3*  MCV 95.7 93.2  PLT 207 186   Cardiac Enzymes: No results for input(s): CKTOTAL, CKMB, CKMBINDEX, TROPONINI in the last 168 hours. BNP: Invalid input(s): POCBNP CBG:  Recent Labs Lab 01/30/15 0543 01/30/15 1352 01/30/15 1629 01/30/15 2146 01/31/15 0555  GLUCAP 185* 142* 244* 154* 210*    Recent Results (from the past 240 hour(s))  Urine culture     Status: None   Collection Time: 01/21/15  9:00 AM  Result Value Ref Range Status   Specimen Description URINE, CLEAN CATCH  Final   Special Requests NONE  Final   Culture NO GROWTH 1 DAY  Final   Report Status 01/22/2015 FINAL  Final     Scheduled Meds: . antiseptic oral rinse  7 mL Mouth Rinse BID  . aspirin  81 mg Oral Daily  . atorvastatin  10 mg Oral q1800  . calcium acetate  667 mg Oral TID WC  . carvedilol  12.5 mg Oral BID WC  . darbepoetin (ARANESP) injection - NON-DIALYSIS  100 mcg Subcutaneous Q Fri-1800  . dextromethorphan-guaiFENesin  1 tablet Oral BID  . feeding supplement (NEPRO CARB STEADY)  237 mL Oral Q24H  . heparin subcutaneous  5,000 Units Subcutaneous 3 times per day  . insulin aspart  0-5 Units Subcutaneous QHS  . insulin aspart  0-9 Units Subcutaneous TID WC  . insulin aspart  5 Units Subcutaneous TID WC  . insulin  glargine  12 Units Subcutaneous Daily  . latanoprost  1 drop Both Eyes QHS  . losartan  50 mg Oral QHS  . salmeterol  1 puff Inhalation BID   Continuous Infusions:    TAT, DAVID, DO  [facility] Pager [phone]  If 7PM-7AM, please contact night-coverage [email or web address] Password [email or web address] 01/31/2015, 8:23 AM   LOS: 17 days

## 2015-01-31 NOTE — Transfer of Care (Signed)
Immediate Anesthesia Transfer of Care Note  Patient: Pamela [name]  Procedure(s) Performed: Procedure(s): ARTERIOVENOUS FISTULA CREATION-LEFT BRACHIO-CEPHALIC  (Left)  Patient Location: PACU  Anesthesia Type:MAC  Level of Consciousness: awake  Airway & Oxygen Therapy: Patient Spontanous Breathing  Post-op Assessment: Report given to RN and Post -op Vital signs reviewed and stable  Post vital signs: Reviewed and stable  Last Vitals:  Filed Vitals:   01/31/15 0800  BP:   Pulse:   Temp: 36.7 C  Resp: 18    Complications: No apparent anesthesia complications

## 2015-01-31 NOTE — H&P (View-Only) (Signed)
Hospital Consult    Reason for Consult:  In need of permanent HD access Referring Physician:  Allena Katz MRN #:  [id number]  History of Present Illness: This is a 64 y.o. female with a hx of CKD 3 and underwent a bx of her left kidney on 01/12/15.  Her mental status after than declined and on the 2nd day, she lost consciousness and was brought to the ER.  She was noted to be in renal failure, hyperkalemic and in respiratory failure, which did require a brief intubation.   She did have septic shock from presumed UTI and has been on abx for this.  She did have positive sputum cultures grew Enterobacter cloacae. The pt is now a partial DNR with no CPR or cardioversion.  Her renal bx revealed severe nephrosclerosis with markers indicative of chronicity/tubulointerstitial fibrosis.  Her renal function continues to worsen. She is getting a diatek catheter today placed by IR.    She does have CAD with chronic combined diastolic and systolic heart failure, which was confirmed with TTE and TEE.  There was no vegetation or evidence of endocarditis.  She does have an EF of 25-30%.  She did have an upper extremity duplex on 01/19/15, which revealed no DVT, but did have superficial thrombus in the left cephalic vein at the antecubital fossa.   She is diabetic and on Amaryl and Metformin.  She is on a statin for cholesterol management.  She is on a beta blocker, CCB for hypertension.    Past Medical History  Diagnosis Date  . PONV (postoperative nausea and vomiting)   . Hypertension   . COPD (chronic obstructive pulmonary disease)   . Diabetes mellitus without complication 10-29-12  . Coronary artery disease   . Shortness of breath dyspnea   . Anxiety   . Chronic kidney disease     Past Surgical History  Procedure Laterality Date  . Abdominal hysterectomy    . Elbow surgery Right     tendon surgery  . Tubal ligation    . Breast surgery      cyst removed  . Cholecystectomy      '74-open  .  Appendectomy      '74- open with gallbladder  . Cardiac catheterization      1 coronary stent placed  . Ganglion cyst excision Bilateral 10-29-12    wrist  . Anal fissure repair    . Colonoscopy with propofol N/A 11/17/2012    Procedure: COLONOSCOPY WITH PROPOFOL;  Surgeon: Charolett Bumpers, MD;  Location: [facility] ENDOSCOPY;  Service: Endoscopy;  Laterality: N/A;  . Esophagogastroduodenoscopy (egd) with propofol N/A 11/17/2012    Procedure: ESOPHAGOGASTRODUODENOSCOPY (EGD) WITH PROPOFOL;  Surgeon: Charolett Bumpers, MD;  Location: [facility] ENDOSCOPY;  Service: Endoscopy;  Laterality: N/A;  . Left heart catheterization with coronary angiogram N/A 04/14/2014    Procedure: LEFT HEART CATHETERIZATION WITH CORONARY ANGIOGRAM;  Surgeon: Lesleigh Noe, MD;  Location: [facility] CATH LAB;  Service: Cardiovascular;  Laterality: N/A;  . Tee without cardioversion N/A 01/20/2015    Procedure: TRANSESOPHAGEAL ECHOCARDIOGRAM (TEE);  Surgeon: Pricilla Riffle, MD;  Location: [facility] ENDOSCOPY;  Service: Cardiovascular;  Laterality: N/A;    No Known Allergies  Prior to Admission medications   Medication Sig Start Date End Date Taking? Authorizing Provider  ALPRAZolam Prudy Feeler) 0.5 MG tablet Take 0.5 mg by mouth 2 (two) times daily.   Yes Historical Provider, MD  amLODipine (NORVASC) 10 MG tablet Take 10 mg by mouth daily. 09/02/14  Yes  Historical Provider, MD  amoxicillin-clavulanate (AUGMENTIN) 500-125 MG per tablet Take 1 tablet by mouth 2 (two) times daily. 01/13/15  Yes Historical Provider, MD  aspirin EC 81 MG tablet Take 81 mg by mouth daily.   Yes Historical Provider, MD  atorvastatin (LIPITOR) 10 MG tablet Take 10 mg by mouth every morning.   Yes Historical Provider, MD  carvedilol (COREG) 25 MG tablet Take 1 tablet (25 mg total) by mouth 3 (three) times daily. 04/20/14  Yes Lyn Records, MD  furosemide (LASIX) 40 MG tablet Take 1 tablet by mouth  daily Patient taking differently: Take 2 tablet by mouth  daily 07/19/14  Yes Lyn Records, MD  glimepiride (AMARYL) 2 MG tablet Take 2 mg by mouth daily. 09/02/14  Yes Historical Provider, MD  iron polysaccharides (NIFEREX) 150 MG capsule Take 1 capsule (150 mg total) by mouth daily. 03/28/14  Yes Rhetta Mura, MD  isosorbide dinitrate (ISORDIL) 20 MG tablet Take 1 tablet (20 mg total) by mouth 3 (three) times daily. 07/29/14  Yes Lyn Records, MD  latanoprost (XALATAN) 0.005 % ophthalmic solution Place 1 drop into both eyes at bedtime.   Yes Historical Provider, MD  metFORMIN (GLUCOPHAGE) 500 MG tablet Take 500-1,000 mg by mouth 3 (three) times daily. Takes 1000 mg in the morning, 500 mg at lunch, and 1000 mg at night   Yes Historical Provider, MD  nitroGLYCERIN (NITROSTAT) 0.4 MG SL tablet Place 0.4 mg under the tongue every 5 (five) minutes as needed for chest pain.   Yes Historical Provider, MD  predniSONE (STERAPRED UNI-PAK 21 TAB) 10 MG (21) TBPK tablet Take 10-20 mg by mouth as directed. 6 day dose pack 01/13/15  Yes Historical Provider, MD  PROAIR HFA 108 (90 BASE) MCG/ACT inhaler Inhale 2 puffs into the lungs every 4 (four) hours as needed for wheezing or shortness of breath.  01/13/15  Yes Historical Provider, MD  salmeterol (SEREVENT) 50 MCG/DOSE diskus inhaler Inhale 1 puff into the lungs 2 (two) times daily.   Yes Historical Provider, MD    Social History   Social History  . Marital Status: Married    Spouse Name: N/A  . Number of Children: N/A  . Years of Education: N/A   Occupational History  . Not on file.   Social History Main Topics  . Smoking status: Former Smoker -- 1.50 packs/day    Types: Cigarettes    Quit date: 10/29/1992  . Smokeless tobacco: Not on file  . Alcohol Use: No  . Drug Use: No  . Sexual Activity: Not Currently   Other Topics Concern  . Not on file   Social History Narrative     Family History  Problem Relation Age of Onset  . Diabetes Father   . Cancer Father   . Heart attack Mother   . Hypertension Mother   .  Hypertension Father   . Diabetes Sister   . Hypertension Sister   . Cancer Sister   . Hypertension Sister     ROS:  Positive    Negative    All sytems reviewed and are negative  Cardiovascular:  chest pain/pressure  palpitations  SOB lying flat  DOE  pain in legs while walking  pain in legs at rest  pain in legs at night  non-healing ulcers  hx of DVT  swelling in legs  Pulmonary:  productive cough  asthma/wheezing  home O2  COPD  Neurologic:  weakness in  arms   legs  numbness in  arms  legs  hx of CVA  mini stroke difficulty speaking or slurred speech  temporary loss of vision in one eye  dizziness  Hematologic:  hx of cancer  bleeding problems  problems with blood clotting easily  Endocrine:    diabetes  thyroid disease  GI  vomiting blood  blood in stool  GU:  CKD/renal failure  HD--[]  M/W/F or  T/T/S  burning with urination  blood in urine  Psychiatric:  anxiety-takes Xanax  depression  Musculoskeletal:  arthritis  joint pain  Integumentary:  rashes  ulcers  Constitutional:  fever  chills   Physical Examination  Filed Vitals:   01/25/15 0545  BP: 135/80  Pulse: 88  Temp: 98.4 F (36.9 C)  Resp: 18   Body mass index is 22.76 kg/(m^2).  General:  WDWN in NAD Gait: Not observed HENT: WNL, normocephalic Pulmonary: normal non-labored breathing, without Rales, rhonchi,  wheezing Cardiac: regular, without  Murmurs, rubs or gallops; without carotid bruits Abdomen: soft, NT/ND, no masses Skin: without rashes, without ulcers  Vascular Exam/Pulses:  Right Left  Radial 2+ (normal) 2+ (normal)  Ulnar Unable to palpate  Unable to palpate   Popliteal Unable to palpate  Unable to palpate   DP 2+ (normal) 2+ (normal)  PT Unable to palpate  Unable to palpate    Extremities: without ischemic changes, without Gangrene , without  cellulitis; without open wounds; +pitting edema - right > left Musculoskeletal: no muscle wasting or atrophy  Neurologic: A&O X 3; Appropriate Affect ; SENSATION: normal; MOTOR FUNCTION:  moving all extremities equally. Speech is fluent/normal Psychiatric: Judgment intact, Mood & affect appropriate for pt's clinical situation Lymph : No Cervical, Axillary, or Inguinal lymphadenopathy    CBC    Component Value Date/Time   WBC 9.0 01/22/2015 0500   RBC 2.64* 01/22/2015 0500   RBC 3.28* 03/25/2014 1700   HGB 7.8* 01/22/2015 0500   HCT 25.0* 01/22/2015 0500   PLT 152 01/22/2015 0500   MCV 94.7 01/22/2015 0500   MCH 29.5 01/22/2015 0500   MCHC 31.2 01/22/2015 0500   RDW 16.7* 01/22/2015 0500   LYMPHSABS 0.6* 01/19/2015 0404   MONOABS 0.7 01/19/2015 0404   EOSABS 0.0 01/19/2015 0404   BASOSABS 0.0 01/19/2015 0404    BMET    Component Value Date/Time   NA 118* 01/25/2015 0550   K 4.3 01/25/2015 0550   CL 84* 01/25/2015 0550   CO2 24 01/25/2015 0550   GLUCOSE 126* 01/25/2015 0550   BUN 39* 01/25/2015 0550   CREATININE 5.54* 01/25/2015 0550   CALCIUM 7.6* 01/25/2015 0550   CALCIUM 9.4 12/06/2014 0821   GFRNONAA 7* 01/25/2015 0550   GFRAA 9* 01/25/2015 0550    COAGS: Lab Results  Component Value Date   INR 1.23 01/17/2015   INR 1.92* 01/14/2015   INR 1.10 01/12/2015     Non-Invasive Vascular Imaging:   Vein mapping ordered and pending  Statin:  Yes.   Beta Blocker:  Yes.   Aspirin:  Yes.   ACEI:  No. ARB:  No. Other antiplatelets/anticoagulants:  Yes, SQ heparin   ASSESSMENT/PLAN: This is a 64 y.o. female with worsening CKD now ESRD in need of permanent HD access,  Urosepsis, Acute on chronic renal failure, Acute respiratory failure with COPD, chronic systolic heart failure, ICM, CAD, DM  -pt going to IR now for [facility] placement -await vein mapping to determine what access she may be a candidate for -the pt is  right handed.  Her upper extremity ultrasound  revealed superficial thrombosis in the left cephalic vein at the antecubital area. -discussed with the pt and family about fistula vs graft   Doreatha Massed, PA-C [facility] [phone]  Addendum  Vein mapping suggest L BC AVF vs BVT.  Pt has antecubital IV on R.  Left antecubitum has extensive echymosis. Prior cephalic vein thrombus may make L BC AVF not possible, though sometimes a thrombectomy can be completed or the segment proximal to the thrombus can be used.  L stage BVT also appears probable.  Will proceed with L arm fistula placement on 01/31/15.  Risk, benefits, and alternatives to access surgery were discussed.  The patient is aware the risks include but are not limited to: bleeding, infection, steal syndrome, nerve damage, ischemic monomelic neuropathy, failure to mature, need for additional procedures, death and stroke.  The patient agrees to proceed forward with the procedure.   Leonides Sake, MD [facility] [place] Office: [phone] Pager: [phone]  01/26/2015, 8:55 PM

## 2015-01-31 NOTE — Progress Notes (Signed)
PT Cancellation Note  Patient Details Name: Pamela [name] MRN: [id number] DOB: [birth year]   Cancelled Treatment:    Reason Eval/Treat Not Completed: Patient at procedure or test/unavailable   [name] 01/31/2015, 9:35 AM [name], PT [phone]

## 2015-02-01 ENCOUNTER — Encounter (HOSPITAL_COMMUNITY): Payer: Self-pay | Admitting: Vascular Surgery

## 2015-02-01 ENCOUNTER — Encounter (HOSPITAL_COMMUNITY): Payer: Self-pay

## 2015-02-01 ENCOUNTER — Telehealth: Payer: Self-pay | Admitting: Vascular Surgery

## 2015-02-01 DIAGNOSIS — G9340 Encephalopathy, unspecified: Secondary | ICD-10-CM

## 2015-02-01 DIAGNOSIS — N186 End stage renal disease: Secondary | ICD-10-CM

## 2015-02-01 DIAGNOSIS — E119 Type 2 diabetes mellitus without complications: Secondary | ICD-10-CM

## 2015-02-01 DIAGNOSIS — Z992 Dependence on renal dialysis: Secondary | ICD-10-CM

## 2015-02-01 LAB — RENAL FUNCTION PANEL
Albumin: 2.7 g/dL — ABNORMAL LOW (ref 3.5–5.0)
Anion gap: 9 (ref 5–15)
BUN: 27 mg/dL — AB (ref 6–20)
CHLORIDE: 94 mmol/L — AB (ref 101–111)
CO2: 27 mmol/L (ref 22–32)
CREATININE: 3.83 mg/dL — AB (ref 0.44–1.00)
Calcium: 8.6 mg/dL — ABNORMAL LOW (ref 8.9–10.3)
GFR calc Af Amer: 13 mL/min — ABNORMAL LOW (ref >60)
GFR, EST NON AFRICAN AMERICAN: 11 mL/min — AB (ref >60)
Glucose, Bld: 211 mg/dL — ABNORMAL HIGH (ref 65–99)
POTASSIUM: 4.4 mmol/L (ref 3.5–5.1)
Phosphorus: 5.7 mg/dL — ABNORMAL HIGH (ref 2.5–4.6)
Sodium: 130 mmol/L — ABNORMAL LOW (ref 135–145)

## 2015-02-01 LAB — MAGNESIUM: MAGNESIUM: 2 mg/dL (ref 1.7–2.4)

## 2015-02-01 LAB — CBC
HEMATOCRIT: 26.7 % — AB (ref 36.0–46.0)
Hemoglobin: 8.2 g/dL — ABNORMAL LOW (ref 12.0–15.0)
MCH: 30 pg (ref 26.0–34.0)
MCHC: 30.7 g/dL (ref 30.0–36.0)
MCV: 97.8 fL (ref 78.0–100.0)
PLATELETS: 210 10*3/uL (ref 150–400)
RBC: 2.73 MIL/uL — AB (ref 3.87–5.11)
RDW: 18.5 % — ABNORMAL HIGH (ref 11.5–15.5)
WBC: 9.6 10*3/uL (ref 4.0–10.5)

## 2015-02-01 LAB — GLUCOSE, CAPILLARY
Glucose-Capillary: 214 mg/dL — ABNORMAL HIGH (ref 65–99)
Glucose-Capillary: 155 mg/dL — ABNORMAL HIGH (ref 65–99)
Glucose-Capillary: 166 mg/dL — ABNORMAL HIGH (ref 65–99)

## 2015-02-01 MED ORDER — GLIMEPIRIDE 4 MG PO TABS: 4.0000 mg | ORAL_TABLET | Freq: Every day | ORAL | Status: DC

## 2015-02-01 MED ORDER — ALPRAZOLAM 0.5 MG PO TABS: 0.5000 mg | ORAL_TABLET | Freq: Every day | ORAL | Status: DC

## 2015-02-01 MED ORDER — LOSARTAN POTASSIUM 50 MG PO TABS: 50.0000 mg | ORAL_TABLET | Freq: Every day | ORAL | Status: DC

## 2015-02-01 MED ORDER — NEPRO/CARBSTEADY PO LIQD: 237.0000 mL | ORAL | Status: DC

## 2015-02-01 MED ORDER — CARVEDILOL 12.5 MG PO TABS
25.0000 mg | ORAL_TABLET | Freq: Two times a day (BID) | ORAL | Status: DC
Start: 2015-02-01 — End: 2015-02-01

## 2015-02-01 MED ORDER — CALCIUM ACETATE (PHOS BINDER) 667 MG PO CAPS: 667.0000 mg | ORAL_CAPSULE | Freq: Three times a day (TID) | ORAL | Status: DC

## 2015-02-01 MED ORDER — CARVEDILOL 12.5 MG PO TABS: 12.5000 mg | ORAL_TABLET | Freq: Two times a day (BID) | ORAL | Status: DC

## 2015-02-01 NOTE — Progress Notes (Signed)
PROGRESS NOTE  Pamela [name] QAE:[id number] DOB: 05-Jun-1950 DOA: 01/14/2015 PCP: Georgann Housekeeper, MD Primary Cardiologist: Dr Verdis Prime  Summary: 64 year old woman past medical history chronic kidney disease stage IV, chronic systolic and diastolic heart failure, thereafter presented 9/3 with hypothermia, hypotension, lethargy, metabolic encephalopathy secondary to uremia. Found to have AKI with hyperkalemia, suspected sepsis, developed respiratory failure secondary to volume overload and was unable to protect airway secondary to uremia, was subsequently intubated and admitted to the ICU. There was concern for sepsis from possible UTI. Required CVVH secondary to severe acidosis. Echocardiogram revealed severe systolic dysfunction and cardiology was consulted. Both transthoracic echocardiogram and transesophageal echocardiograms performed confirmed severe combined systolic and diastolic CHF with no evidence of hemodialysis. She stabilized and was transferred to the hospitalist service September 10. Because of continuing renal function decline it is felt that she would likely need permanent hemodialysis, she underwent permacatheter placement and vascular surgery was consulted for fistula placement which was completed September 20. Her hospitalization was prolonged by the need to wait for an outpatient dialysis chair.  Assessment/Plan: 1. 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 CLIPPED to [facility] on [employer], TTS schedule, arrive tomorrow at 11:30 AM 2. Acute on chronic combined systolic and diastolic congestive heart failure, LVEF 25-30%, stable, management per cardiology as below. Continue aspirin, statin, losartan, Coreg. 3. Diabetes mellitus type 2. Hemoglobin A1c 6.8. Previously on metformin. Start on insulin this hospitalization.  However the patient declines to take insulin on discharge. After discussion with Dr. Eliott Nine will start Amaryl 4 mg daily. The patient has been instructed to check her blood sugars frequently and coordinate with her primary care physician for adjustments. 4. Acute hypoxic respiratory failure secondary to toxic encephalopathy and volume overload requiring intubation and extubation 2. Resolved. Completed course of prednisone ceftriaxone. Stable on room air the last 6 days. 5. Anemia of chronic kidney disease. Management per nephrology. 6. Secondary hyperparathyroidism, per nephrology. 7. Toxic metabolic encephalopathy secondary to uremia, resolved 8. COPD, stable asymptomatic. 9. Left cephalic vein SVT, supportive care no further evaluation or treatment recommended by vascular surgery. 10. Hyponatremia. Asymptomatic. Due to impaired free water excretion with ESRD-improving with dialysis- continue fluid restriction to 1261mL/day 11. Septic shock secondary to UTI on admission with hypothermia, lethargy, hypotension. Thought to be secondary to UTI even though urine culture negative. Had Enterobacter in sputum. Completed antibiotics September 10. Most recent chest x-ray unremarkable.  12. Thrombocytopenia secondary to sepsis. Resolved.   Doing well. She has been cleared for discharge by cardiology and nephrology. She has secured outpatient hemodialysis.  Per cardiology: Continue Coreg 12.5 mg bid and losartan 50 mg daily. Continue ASA 81 and statin for CAD. Can followup in cardiology office after discharge. She will need echo in a couple of months to see if EF improves (?component of stress/septic cardiomyopathy).  Permanently discontinue metformin.  NO MORE THAN 32 oz per day   Brendia Sacks, MD  [facility]  Pager [phone] If 7PM-7AM, please contact night-coverage at [email or web address], password [facility] 02/01/2015, 4:08 PM  LOS: 18 days    Consultants:  PCCM  Nephrology  Cardiology  Vascular surgery  PT [facility]  ST regular diet and liquids  Procedures:  -01/25/15--PermCath placement and HD  -01/31/15--left brachiocephalic arteriovenous fistula placement  ETT 9/3 >> 9/4; 9/6 >> 9/8  Echo Study Conclusions  - Left ventricle: The cavity size was mildly  dilated. Systolic function was severely reduced. The estimated ejection fraction was in the range of 25% to 30%. Wall motion was normal; there were no regional wall motion abnormalities. Doppler parameters are consistent with abnormal left ventricular relaxation (grade 1 diastolic dysfunction). Doppler parameters are consistent with elevated ventricular end-diastolic filling pressure. - Aortic valve: There was no regurgitation. - Aortic root: The aortic root was normal in size. - Mitral valve: Calcified annulus. Mildly thickened leaflets . The findings are consistent with mild stenosis. There was mild regurgitation. - Left atrium: The atrium was normal in size. - Right atrium: The atrium was normal in size. - Tricuspid valve: There was trivial regurgitation. - Pulmonic valve: There was trivial regurgitation. - Pulmonary arteries: Systolic pressure was within the normal range. PA peak pressure: 40 mm Hg (S). - Pericardium, extracardiac: A trivial pericardial effusion was identified. Features were not consistent with tamponade physiology.  Impressions:  - When compared to the study from 03/26/2014 aortic valve is thickened. A vegetation can&'t be excluded. If clinically indicated a TEE is recommended. There is new LV dysfunction with LVEF 25-30% with diffuse hypokinesis, previously LVEF 35-40%.  TEE Left ventricle: LVEF is severely depressed.  ------------------------------------------------------------------- Aortic valve: No obvious vegetaion. Mildly calcified leaflets. Doppler: There was no  regurgitation.  ------------------------------------------------------------------- Mitral valve:  Structurally normal valve.  No evidence of vegetation. Doppler: There was mild regurgitation.  ------------------------------------------------------------------- Left atrium:  No evidence of thrombus in the atrial cavity or appendage.  ------------------------------------------------------------------- Atrial septum: No PFO as tested by color doppler or with injection of agitated saline. No defect or patent foramen ovale was identified.  ------------------------------------------------------------------- Pulmonic valve:  Structurally normal valve.  Cusp separation was normal. No evidence of vegetation.  ------------------------------------------------------------------- Tricuspid valve:  Structurally normal valve.  Leaflet separation was normal. No evidence of vegetation. Doppler: There was no regurgitation.   HPI/Subjective: Feeling good. Breathing well. No complaints. Eating well. Reports she was told by her cardiologist and nephrologist that she would not go home on insulin and that she should take an oral medication. Patient does not desire to take insulin.  Objective: Filed Vitals:   02/01/15 0930 02/01/15 1000 02/01/15 1030 02/01/15 1213  BP: 154/69 161/71 152/79 150/60  Pulse: 99 97 97 97  Temp:   98.2 F (36.8 C) 98.8 F (37.1 C)  TempSrc:   Oral Oral  Resp: Height:      Weight:   58.2 kg (128 lb 4.9 oz)   SpO2:   98% 97%    Intake/Output Summary (Last 24 hours) at 02/01/15 1608 Last data filed at 02/01/15 1214  Gross per 24 hour  Intake    720 ml  Output   3440 ml  Net  -2720 ml     Filed Weights   02/01/15 0541 02/01/15 0717 02/01/15 1030  Weight: 60.691 kg (133 lb 12.8 oz) 61 kg (134 lb 7.7 oz) 58.2 kg (128 lb 4.9 oz)    Exam:     Afebrile, vital signs stable, no hypoxia General:  Appears calm and comfortable sitting  in chair. Cardiovascular: RRR, no m/r/g. 2+ bilateral LE edema. Telemetry: SR, no arrhythmias  Respiratory: CTA bilaterally, no w/r/r. Normal respiratory effort. Psychiatric: grossly normal mood and affect, speech fluent and appropriate  New data reviewed:  CBG stable  BMP consistent with end-stage renal disease  Hemoglobin stable 8.2   Scheduled Meds: . antiseptic oral rinse  7 mL Mouth Rinse BID  . aspirin  81 mg Oral  Daily  . atorvastatin  10 mg Oral q1800  . calcium acetate  667 mg Oral TID WC  . carvedilol  12.5 mg Oral BID WC  . darbepoetin (ARANESP) injection - NON-DIALYSIS  100 mcg Subcutaneous Q Fri-1800  . dextromethorphan-guaiFENesin  1 tablet Oral BID  . feeding supplement (NEPRO CARB STEADY)  237 mL Oral Q24H  . ferric gluconate (FERRLECIT/NULECIT) IV  125 mg Intravenous Q M,W,F-HD  . heparin subcutaneous  5,000 Units Subcutaneous 3 times per day  . insulin aspart  0-5 Units Subcutaneous QHS  . insulin aspart  0-9 Units Subcutaneous TID WC  . insulin aspart  5 Units Subcutaneous TID WC  . insulin glargine  12 Units Subcutaneous Daily  . latanoprost  1 drop Both Eyes QHS  . losartan  50 mg Oral QHS  . multivitamin  1 tablet Oral QHS  . salmeterol  1 puff Inhalation BID   Continuous Infusions:   Active Problems:   Acute respiratory failure with hypoxemia   Septic shock   Acute renal failure   Acidosis   Hyperkalemia   Acute on chronic renal failure   COPD (chronic obstructive pulmonary disease)   Edema   Encephalopathy   Encounter for orogastric (OG) tube placement   Acute on chronic systolic and diastolic heart failure, NYHA class 4   Pressure ulcer   ESRD on dialysis   Hyponatremia   ESRD (end stage renal disease)

## 2015-02-01 NOTE — Progress Notes (Signed)
  [facility] [place] Postoperative hemodialysis access   Date of Surgery:  01/31/15 Surgeon: Dr. Imogene Burn  Subjective:  Denies any hand pain. Having some soreness around incision  PHYSICAL EXAMINATION:  Filed Vitals:   02/01/15 1213  BP: 150/60  Pulse: 97  Temp: 98.8 F (37.1 C)  Resp: 18    Incision is clean and intact Hand grip is 5/5  Sensation in digits is intact;  Easily palpable thrill left upper arm   ASSESSMENT/PLAN:  Pamela [name] is a 64 y.o. year old female who is s/p left brachial-cephalic AV fistula creation.  -graft is patent -pt does have evidence of steal sx -f/u with Dr. Imogene Burn in 4-6 weeks to check maturation of AVF -will sign off-call as needed.   Maris Berger, PA-C [facility] Office:[phone] Pager: [phone]

## 2015-02-01 NOTE — Telephone Encounter (Signed)
-----   Message from Dara Lords, [place] sent at 01/31/2015 10:59 AM EDT ----- S/p left BC AVF 01/31/15.  F/u with Dr. Imogene Burn in 4-6 weeks.  She does not need duplex.  Thanks

## 2015-02-01 NOTE — Progress Notes (Signed)
PT Cancellation Note  Patient Details Name: Pamela [name] MRN: [id number] DOB: 06-04-1950   Cancelled Treatment:    Reason Eval/Treat Not Completed: Patient declined, no reason specified.  ? Going home.   Mottinger, Eliseo Gum 02/01/2015, 5:50 PM

## 2015-02-01 NOTE — Progress Notes (Signed)
Discharge instructions given. Pt verbalized understanding and all questions were answered.  

## 2015-02-01 NOTE — Discharge Summary (Signed)
Physician Discharge Summary  GREG ECKRICH ZOX:[id number] DOB: March 31, 1951 DOA: 01/14/2015  PCP: Georgann Housekeeper, MD  Admit date: 01/14/2015 Discharge date: 02/01/2015  Recommendations for Outpatient Follow-up:  1. Patient refused HH 2. Continue Coreg 12.5 mg bid and losartan 50 mg daily. Continue ASA 81 and statin for CAD. Can followup in cardiology office after discharge. She will need echo in a couple of months to see if EF improves (?component of stress/septic cardiomyopathy). 3. NO MORE THAN 32 oz per day 4. Permanently discontinue metformin. Patient declined to take insulin. She has been started on Amaryl 4 mg and instructed to follow blood sugars closely and coordinate with her primary care physician for optimal blood sugar control.   Follow-up Information    Follow up with Leonides Sake, MD In 4 weeks.   Specialties:  Vascular Surgery, Cardiology   Why:  Office will call you to arrange your appt (sent)   Contact information:   [address] [place] [address] [phone]       Follow up with Lesleigh Noe, MD. Schedule an appointment as soon as possible for a visit in 2 weeks.   Specialty:  Cardiology   Contact information:   [address] [place] [facility] [place] [address] [phone]       Follow up with Cecille Aver, MD.   Specialty:  Nephrology   Why:  As needed   Contact information:   [address] [place] [address] [phone]       Follow up with Georgann Housekeeper, MD.   Specialty:  Internal Medicine   Why:  keep already scheduled appointment 10/4   Contact information:   [address] [place] [address] [phone]      Discharge Diagnoses:  1. 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 Acute on chronic combined systolic, diastolic congestive heart failure 3. Septic shock secondary to presumed UTI 4. Presumed UTI 5. Acute hypoxic respiratory  failure secondary to toxic encephalopathy and volume overload 6. Toxic, metabolic encephalopathy 7. Diabetes mellitus type 2 8. Anemia of chronic kidney disease 9. Secondary hyperparathyroidism 10. COPD 11. Asymptomatic hyponatremia 12. Thrombocytopenia secondary to sepsis  Discharge Condition: improved Disposition: home  Diet recommendation:  Heart healthy diabetic diet  Filed Weights   02/01/15 0541 02/01/15 0717 02/01/15 1030  Weight: 60.691 kg (133 lb 12.8 oz) 61 kg (134 lb 7.7 oz) 58.2 kg (128 lb 4.9 oz)    History of present illness:  64 year old woman past medical history chronic kidney disease stage IV, chronic systolic and diastolic heart failure, thereafter presented 9/3 with hypothermia, hypotension, lethargy, metabolic encephalopathy secondary to uremia. Found to have AKI with hyperkalemia, suspected sepsis, developed respiratory failure secondary to volume overload and was unable to protect airway secondary to uremia, was subsequently intubated and admitted to the ICU.  Hospital Course:  Treated with broad-spectrum antibiotics. There was concern for sepsis from possible UTI. Required CVVH secondary to severe acidosis and acute kidney injury. Echocardiogram revealed severe systolic dysfunction and cardiology was consulted. Both transthoracic echocardiogram and transesophageal echocardiograms performed confirmed severe combined systolic and diastolic CHF with no evidence of hemodialysis. After a complicated clinical course, she stabilized, was extubated and was transferred to the hospitalist service September 10. Because of continuing renal function decline it was felt that she would likely need permanent hemodialysis, she underwent permacatheter placement and vascular surgery was consulted for fistula placement which was completed September 20. Her hospitalization was prolonged  by the need to wait for an outpatient dialysis chair. Individual issues as below.   New ESRD secondary  to progressive chronic kidney disease stage IV, renal biopsy with evidence of hypertensive kidney disease with severe fibrosis-no evidence of renal recovery prompting initiation of dialysis. CLIPPED to [facility] on [employer], TTS schedule, arrive tomorrow at 11:30 AM  Acute on chronic combined systolic and diastolic congestive heart failure, LVEF 25-30%, stable, management per cardiology as below. Continue aspirin, statin, losartan, Coreg.  Diabetes mellitus type 2. Hemoglobin A1c 6.8. Previously on metformin. Start on insulin this hospitalization. However the patient declines to take insulin on discharge. After discussion with Dr. Eliott Nine will start Amaryl 4 mg daily. The patient has been instructed to check her blood sugars frequently and coordinate with her primary care physician for adjustments.  Acute hypoxic respiratory failure secondary to toxic encephalopathy and volume overload requiring intubation and extubation 2. Resolved. Completed course of prednisone ceftriaxone. Stable on room air the last 6 days.  Anemia of chronic kidney disease. Management per nephrology.  Secondary hyperparathyroidism, per nephrology.  Toxic metabolic encephalopathy secondary to uremia, resolved  COPD, stable asymptomatic.  Left cephalic vein SVT, supportive care no further evaluation or treatment recommended by vascular surgery.  Hyponatremia. Asymptomatic. Due to impaired free water excretion with ESRD-improving with dialysis- continue fluid restriction to 1245mL/day  Septic shock secondary to UTI on admission with hypothermia, lethargy, hypotension. Thought to be secondary to UTI even though urine culture negative. Had Enterobacter in sputum. Completed antibiotics September 10. Most recent chest x-ray unremarkable.   Thrombocytopenia secondary to sepsis. Resolved.  Consultants:  PCCM  Nephrology  Cardiology  Vascular surgery  PT [facility]  ST regular diet and  liquids  Procedures:  -01/25/15--PermCath placement and HD  -01/31/15--left brachiocephalic arteriovenous fistula placement  ETT 9/3 >> 9/4; 9/6 >> 9/8  Echo Study Conclusions  - Left ventricle: The cavity size was mildly dilated. Systolic function was severely reduced. The estimated ejection fraction was in the range of 25% to 30%. Wall motion was normal; there were no regional wall motion abnormalities. Doppler parameters are consistent with abnormal left ventricular relaxation (grade 1 diastolic dysfunction). Doppler parameters are consistent with elevated ventricular end-diastolic filling pressure. - Aortic valve: There was no regurgitation. - Aortic root: The aortic root was normal in size. - Mitral valve: Calcified annulus. Mildly thickened leaflets . The findings are consistent with mild stenosis. There was mild regurgitation. - Left atrium: The atrium was normal in size. - Right atrium: The atrium was normal in size. - Tricuspid valve: There was trivial regurgitation. - Pulmonic valve: There was trivial regurgitation. - Pulmonary arteries: Systolic pressure was within the normal range. PA peak pressure: 40 mm Hg (S). - Pericardium, extracardiac: A trivial pericardial effusion was identified. Features were not consistent with tamponade physiology.  Impressions:  - When compared to the study from 03/26/2014 aortic valve is thickened. A vegetation can&'t be excluded. If clinically indicated a TEE is recommended. There is new LV dysfunction with LVEF 25-30% with diffuse hypokinesis, previously LVEF 35-40%.  TEE Left ventricle: LVEF is severely depressed.  ------------------------------------------------------------------- Aortic valve: No obvious vegetaion. Mildly calcified leaflets. Doppler: There was no regurgitation.  ------------------------------------------------------------------- Mitral valve:  Structurally normal valve.   No evidence of vegetation. Doppler: There was mild regurgitation.  ------------------------------------------------------------------- Left atrium:  No evidence of thrombus in the atrial cavity or appendage.  ------------------------------------------------------------------- Atrial septum: No PFO as tested by color doppler or with injection of agitated saline. No defect or  patent foramen ovale was identified.  ------------------------------------------------------------------- Pulmonic valve:  Structurally normal valve.  Cusp separation was normal. No evidence of vegetation.  ------------------------------------------------------------------- Tricuspid valve:  Structurally normal valve.  Leaflet separation was normal. No evidence of vegetation. Doppler: There was no regurgitation.  Discharge Instructions  Discharge Instructions    (HEART FAILURE PATIENTS) Call MD:  Anytime you have any of the following symptoms: 1) 3 pound weight gain in 24 hours or 5 pounds in 1 week 2) shortness of breath, with or without a dry hacking cough 3) swelling in the hands, feet or stomach 4) if you have to sleep on extra pillows at night in order to breathe.    Complete by:  As directed      Diet - low sodium heart healthy    Complete by:  As directed      Discharge instructions    Complete by:  As directed   Call your physician or seek immediate medical attention for blood sugar less than 70, greater than 400, confusion, weight gain, shortness of breath or worsening of condition. No more than 32 oz per day of fluid.     Increase activity slowly    Complete by:  As directed           Current Discharge Medication List    START taking these medications   Details  calcium acetate (PHOSLO) 667 MG capsule Take 1 capsule (667 mg total) by mouth 3 (three) times daily with meals. Qty: 90 capsule, Refills: 0    losartan (COZAAR) 50 MG tablet Take 1 tablet (50 mg total) by mouth at  bedtime. Qty: 30 tablet, Refills: 0    Nutritional Supplements (FEEDING SUPPLEMENT, NEPRO CARB STEADY,) LIQD Take 237 mLs by mouth daily. Qty: 30 Can, Refills: 0      CONTINUE these medications which have CHANGED   Details  ALPRAZolam (XANAX) 0.5 MG tablet Take 1 tablet (0.5 mg total) by mouth at bedtime. Refills: 0    carvedilol (COREG) 12.5 MG tablet Take 1 tablet (12.5 mg total) by mouth 2 (two) times daily with a meal. Qty: 60 tablet, Refills: 0    glimepiride (AMARYL) 4 MG tablet Take 1 tablet (4 mg total) by mouth daily. Qty: 30 tablet, Refills: 0      CONTINUE these medications which have NOT CHANGED   Details  aspirin EC 81 MG tablet Take 81 mg by mouth daily.    atorvastatin (LIPITOR) 10 MG tablet Take 10 mg by mouth every morning.    latanoprost (XALATAN) 0.005 % ophthalmic solution Place 1 drop into both eyes at bedtime.    nitroGLYCERIN (NITROSTAT) 0.4 MG SL tablet Place 0.4 mg under the tongue every 5 (five) minutes as needed for chest pain.    PROAIR HFA 108 (90 BASE) MCG/ACT inhaler Inhale 2 puffs into the lungs every 4 (four) hours as needed for wheezing or shortness of breath.     salmeterol (SEREVENT) 50 MCG/DOSE diskus inhaler Inhale 1 puff into the lungs 2 (two) times daily.      STOP taking these medications     amLODipine (NORVASC) 10 MG tablet      amoxicillin-clavulanate (AUGMENTIN) 500-125 MG per tablet      furosemide (LASIX) 40 MG tablet      iron polysaccharides (NIFEREX) 150 MG capsule      isosorbide dinitrate (ISORDIL) 20 MG tablet      metFORMIN (GLUCOPHAGE) 500 MG tablet      predniSONE (STERAPRED UNI-PAK 21 TAB)  10 MG (21) TBPK tablet        No Known Allergies  The results of significant diagnostics from this hospitalization (including imaging, microbiology, ancillary and laboratory) are listed below for reference.    Significant Diagnostic Studies: Dg Chest 2 View  01/21/2015   CLINICAL DATA:  Cough.  EXAM: CHEST  2 VIEW   COMPARISON:  January 19, 2015.  FINDINGS: The heart size and mediastinal contours are within normal limits. Both lungs are clear. No pneumothorax is noted. Minimal bilateral pleural effusions are noted. The visualized skeletal structures are unremarkable.  IMPRESSION: Minimal bilateral pleural effusions. No other significant abnormality seen in the chest.   Electronically Signed   By: Lupita Raider, M.D.   On: 01/21/2015 20:27   US Renal  01/14/2015   CLINICAL DATA:  Renal failure. History of hypertension, COPD, diabetes.  EXAM: RENAL / URINARY TRACT ULTRASOUND COMPLETE  COMPARISON:  10/21/2014  FINDINGS: Right Kidney:  Length: 9.9 cm. Renal parenchyma is echogenic. Multiple small cysts are identified. The largest is seen in the lower pole region measuring 1.1 cm. No hydronephrosis.  Left Kidney:  Length: 10.2 cm. Echogenic renal parenchyma. Multiple small cysts are identified, largest in the lower pole region which measures 1.3 cm.  Bladder:  A Foley catheter decompresses the bladder.  Additional:  Note is made of a small amount of ascites.  IMPRESSION: 1. Echogenic kidneys bilaterally.  Bilateral renal cysts. 2. No hydronephrosis. 3. Small amount of ascites noted.   Electronically Signed   By: Norva Pavlov M.D.   On: 01/14/2015 17:44   US Biopsy  01/12/2015   INDICATION: Proteinuria of uncertain etiology. Please perform ultrasound-guided biopsy for tissue diagnostic purposes.  EXAM: ULTRASOUND GUIDED RENAL BIOPSY  COMPARISON:  Renal ultrasound - 10/21/2014  MEDICATIONS: Fentanyl 50 mcg IV; Versed 1 mg IV  ANESTHESIA/SEDATION: Total Moderate Sedation time  15 minutes  COMPLICATIONS: SIR level A: No therapy, no consequence.  Procedure complicated by development of a tiny, asymptomatic, non enlarging left-sided perinephric hematoma.  PROCEDURE: Informed written consent was obtained from the patient after a discussion of the risks, benefits and alternatives to treatment. The patient understands and consents  the procedure. A timeout was performed prior to the initiation of the procedure.  Ultrasound scanning was performed of the bilateral flanks. The inferior pole of the left kidney was selected for biopsy due to location and sonographic window. The procedure was planned. The operative site was prepped and draped in the usual sterile fashion. The overlying soft tissues were anesthetized with 1% lidocaine with epinephrine. A 17 gauge core needle biopsy device was advanced into the inferior cortex of the left kidney and 3 core biopsies were obtained under direct ultrasound guidance. Real time pathologic review confirmed adequate tissue acquisition. Images were saved for documentation purposes. The biopsy device was removed and hemostasis was obtained with manual compression.  Postprocedural scanning and demonstrated development of a very tiny perinephric hematoma which was noted to not enlarged on the acquired delayed postprocedural imaging (representative images 8 and 9). Post procedural scanning was negative for significant post procedural hemorrhage or additional complication. A dressing was placed. The patient tolerated the procedure well without immediate post procedural complication.  IMPRESSION: Technically successful ultrasound guided left renal biopsy.   Electronically Signed   By: Simonne Come M.D.   On: 01/12/2015 13:57   Ir Fluoro Guide Cv Line Right  01/26/2015   CLINICAL DATA:  Renal disease  EXAM: TUNNELED DIALYSIS CATHETER PLACEMENT, ULTRASOUND GUIDANCE  FOR VASCULAR ACCESS  FLUOROSCOPY TIME:  12 seconds  MEDICATIONS AND MEDICAL HISTORY: Versed 0.5 mg, Fentanyl 25 mcg.  As antibiotic prophylaxis, Ancef was ordered pre-procedure and administered intravenously within one hour of incision.  ANESTHESIA/SEDATION: Moderate sedation time: 15 minutes  CONTRAST:  None  PROCEDURE: The procedure, risks, benefits, and alternatives were explained to the patient. Questions regarding the procedure were encouraged and  answered. The patient understands and consents to the procedure.  The right neck was prepped with Betadine in a sterile fashion, and a sterile drape was applied covering the operative field. A sterile gown and sterile gloves were used for the procedure. 1% lidocaine into the skin and subcutaneous tissue. The right internal jugular vein was noted to be patent initially with ultrasound. Under sonographic guidance, a micropuncture needle was inserted into the right IJ vein (Ultrasound and fluoroscopic image documentation was performed). It was removed over an 018 wire which was upsized to an Amplatz. This was advanced into the IVC.  A small incision was made in the right upper chest. The tunneling device was utilized to advance the 23 centimeter tip to cuff catheter from the chest incision and out the neck incision. A peel-away sheath was advanced over the Amplatz wire. The leading edge of the catheter was then advanced through the peel-away sheath. The peel-away sheath was removed. It was flushed and instilled with heparin. The chest incision was closed with a 0 Prolene pursestring stitch. The neck incision was closed with a 4-0 Vicryl subcuticular stitch.  COMPLICATIONS: None  FINDINGS: The image demonstrates placement of a tunneled dialysis catheter with its tip in the right atrium.  IMPRESSION: Successful right IJ vein tunneled dialysis catheter with its tip in the right atrium.   Electronically Signed   By: Jolaine Click M.D.   On: 01/26/2015 12:45   Ir US Guide Vasc Access Right  01/26/2015   CLINICAL DATA:  Renal disease  EXAM: TUNNELED DIALYSIS CATHETER PLACEMENT, ULTRASOUND GUIDANCE FOR VASCULAR ACCESS  FLUOROSCOPY TIME:  12 seconds  MEDICATIONS AND MEDICAL HISTORY: Versed 0.5 mg, Fentanyl 25 mcg.  As antibiotic prophylaxis, Ancef was ordered pre-procedure and administered intravenously within one hour of incision.  ANESTHESIA/SEDATION: Moderate sedation time: 15 minutes  CONTRAST:  None  PROCEDURE: The  procedure, risks, benefits, and alternatives were explained to the patient. Questions regarding the procedure were encouraged and answered. The patient understands and consents to the procedure.  The right neck was prepped with Betadine in a sterile fashion, and a sterile drape was applied covering the operative field. A sterile gown and sterile gloves were used for the procedure. 1% lidocaine into the skin and subcutaneous tissue. The right internal jugular vein was noted to be patent initially with ultrasound. Under sonographic guidance, a micropuncture needle was inserted into the right IJ vein (Ultrasound and fluoroscopic image documentation was performed). It was removed over an 018 wire which was upsized to an Amplatz. This was advanced into the IVC.  A small incision was made in the right upper chest. The tunneling device was utilized to advance the 23 centimeter tip to cuff catheter from the chest incision and out the neck incision. A peel-away sheath was advanced over the Amplatz wire. The leading edge of the catheter was then advanced through the peel-away sheath. The peel-away sheath was removed. It was flushed and instilled with heparin. The chest incision was closed with a 0 Prolene pursestring stitch. The neck incision was closed with a 4-0 Vicryl subcuticular stitch.  COMPLICATIONS:  None  FINDINGS: The image demonstrates placement of a tunneled dialysis catheter with its tip in the right atrium.  IMPRESSION: Successful right IJ vein tunneled dialysis catheter with its tip in the right atrium.   Electronically Signed   By: Jolaine Click M.D.   On: 01/26/2015 12:45   Dg Chest Port 1 View  01/31/2015   CLINICAL DATA:  Status post dialysis catheter placement  EXAM: PORTABLE CHEST - 1 VIEW  COMPARISON:  01/15/2015  FINDINGS: Right-sided dialysis catheter is noted with the tip in the superior aspect of the right atrium in satisfactory position. No pneumothorax is noted. No focal infiltrate is seen. No  bony abnormality is noted.  IMPRESSION: No acute abnormality noted.  Dialysis catheter in place.   Electronically Signed   By: Alcide Clever M.D.   On: 01/31/2015 16:31   Dg Chest Port 1 View  01/23/2015   CLINICAL DATA:  Shortness of breath  EXAM: PORTABLE CHEST - 1 VIEW  COMPARISON:  January 21, 2015  FINDINGS: The heart size and mediastinal contours are within normal limits. There is no focal pneumonia or pulmonary edema. Minimal right pleural effusion is identified. The visualized skeletal structures are stable.  IMPRESSION: Minimal right pleural effusion.  No focal pneumonia.   Electronically Signed   By: Sherian Rein M.D.   On: 01/23/2015 09:40   Dg Chest Port 1 View  01/19/2015   CLINICAL DATA:  Renal failure.  EXAM: PORTABLE CHEST - 1 VIEW  COMPARISON:  01/17/2015.  FINDINGS: Endotracheal tube, left IJ line, NG tube in stable position. Heart size is stable. Bibasilar pulmonary infiltrates consistent pneumonia. Minimal interval improvement. No pleural effusion or pneumothorax.  IMPRESSION: 1. Lines and tubes in stable position. 2. Bibasilar infiltrates consistent with pneumonia. Minimal interval improvement.   Electronically Signed   By: Maisie Fus  Register   On: 01/19/2015 07:21   Dg Chest Port 1 View  01/17/2015   CLINICAL DATA:  Ventilator dependent respiratory failure. Followup left basilar atelectasis.  EXAM: PORTABLE CHEST - 1 VIEW  COMPARISON:  01/15/2015 and earlier.  FINDINGS: Endotracheal tube tip in satisfactory position approximately 4 cm above the carina. Nasogastric tube courses below the diaphragm into the stomach though its tip is not included on the image. Left jugular central venous catheter tip projects over the mid to lower SVC.  Cardiac silhouette normal in size, unchanged. Airspace opacities involving the lung bases bilaterally, right greater than left, progressive since the examination 2 days ago. Pulmonary vascularity normal. Possible small bilateral pleural effusions.   IMPRESSION: 1. Support apparatus satisfactory. 2. Pneumonia involving the lung bases, right greater than left, progressive since the examination 2 days ago. 3. Possible small bilateral pleural effusions.   Electronically Signed   By: Hulan Saas M.D.   On: 01/17/2015 11:28   Dg Chest Port 1 View  01/15/2015   CLINICAL DATA:  Septic shock, respiratory failure and acute renal failure. Question left lower lobe pneumonia.  EXAM: PORTABLE CHEST - 1 VIEW  COMPARISON:  01/14/2015  FINDINGS: Endotracheal tube with tip 3.2 cm above the carina, left IJ central venous catheter with tip overlying the superior cavoatrial junction NG tube entering the stomach with tip off the field of view noted.  Left lower lung opacity has improved.  There is no evidence of pleural effusion or pneumothorax.  IMPRESSION: Improved left lower lung opacity, suggesting atelectasis.  No other significant change.   Electronically Signed   By: Harmon Pier M.D.   On: 01/15/2015  07:32   Dg Chest Port 1 View  01/14/2015   CLINICAL DATA:  Septic shock, respiratory failure and acute renal failure. Endotracheal tube and central line placement.  EXAM: PORTABLE CHEST - 1 VIEW  COMPARISON:  01/14/2015 and prior radiographs  FINDINGS: The cardiomediastinal silhouette is unchanged.  An endotracheal tube with tip 5.1 cm above the carina, left IJ central venous catheter with tip overlying the lower SVC and NG tube entering the stomach with tip off the field of view are noted.  Left lower lung airspace disease is present.  There is no evidence of pneumothorax or pleural effusion.  IMPRESSION: Left lower lung airspace disease/ pneumonia.  Support apparatus as described.  No evidence of pneumothorax.   Electronically Signed   By: Harmon Pier M.D.   On: 01/14/2015 16:38   Dg Chest Portable 1 View  01/14/2015   CLINICAL DATA:  Sepsis.  EXAM: PORTABLE CHEST - 1 VIEW  COMPARISON:  03/25/2014.  FINDINGS: Interval borderline enlargement of the cardiac silhouette  and mild increase in prominence of the interstitial markings, accentuated by a decreased inspiration. Calcified granuloma in the right lower lung zone. No pleural fluid. Diffuse osteopenia.  IMPRESSION: Interval borderline cardiomegaly and mildly increased prominence of the interstitial markings, most likely due to a poor inspiration. No gross acute abnormality.   Electronically Signed   By: Beckie Salts M.D.   On: 01/14/2015 15:21   Dg Abd Portable 1v  01/17/2015   CLINICAL DATA:  Nasogastric tube placement  EXAM: PORTABLE ABDOMEN - 1 VIEW  COMPARISON:  None.  FINDINGS: Orogastric tube with the tip projecting over the antrum of the stomach. There is no bowel dilatation to suggest obstruction. There is no evidence of pneumoperitoneum, portal venous gas or pneumatosis. There are no pathologic calcifications along the expected course of the ureters.The osseous structures are unremarkable.  IMPRESSION: Orogastric tube with the tip projecting over the antrum of the stomach.   Electronically Signed   By: Elige Ko   On: 01/17/2015 11:19   Labs: Basic Metabolic Panel:  Recent Labs Lab 01/28/15 0514 01/29/15 0332 01/30/15 0326 01/31/15 0514 02/01/15 0307  NA 126* 125* 119* 132* 130*  K 4.1 4.2 4.4 3.7 4.4  CL 93* 93* 86* 96* 94*  CO2 26 25 23 28 27   GLUCOSE 181* 204* 195* 219* 211*  BUN 15 22* 32* 20 27*  CREATININE 2.70* 3.59* 4.45* 2.99* 3.83*  CALCIUM 7.5* 8.0* 7.8* 8.0* 8.6*  MG 1.9 1.9 2.0 2.0 2.0  PHOS 3.2 3.7 4.9* 4.0 5.7*   Liver Function Tests:  Recent Labs Lab 01/28/15 0514 01/29/15 0332 01/30/15 0326 01/31/15 0514 02/01/15 0307  ALBUMIN 2.4* 2.3* 2.3* 2.3* 2.7*   CBC:  Recent Labs Lab 01/28/15 1105 01/30/15 0326 01/31/15 1430 02/01/15 0745  WBC 9.8 7.2 6.7 9.6  HGB 7.9* 7.5* 7.9* 8.2*  HCT 24.6* 23.3* 25.2* 26.7*  MCV 95.7 93.2 96.6 97.8  PLT 207 186 186 210     Recent Labs  01/14/15 1440  BNP 2160.6*    CBG:  Recent Labs Lab 01/31/15 1651  01/31/15 2113 02/01/15 0546 02/01/15 1212 02/01/15 1735  GLUCAP 314* 232* 214* 155* 166*    Principal Problem:   ESRD on dialysis Active Problems:   Acute respiratory failure with hypoxemia   Septic shock   Hyperkalemia   Acute on chronic renal failure   COPD (chronic obstructive pulmonary disease)   Edema   Encephalopathy   Acute on chronic systolic and diastolic  heart failure, NYHA class 4   Pressure ulcer   Hyponatremia   DM (diabetes mellitus)   Time coordinating discharge: 60 minutes  Signed:  Brendia Sacks, MD [facility] 02/01/2015, 5:40 PM

## 2015-02-01 NOTE — Progress Notes (Signed)
Patient ID: Pamela [name], female   DOB: 1950-12-01, 64 y.o.   MRN: [id number]  Pamela [name] Progress Note   Subjective:    Left BC AVF done yesterday Nice bruit, no steal sx For HD today 3 hours only - primarily for volume Will be able to start at the outpt unit tomorrow   Objective:   BP 175/76 mmHg  Pulse 108  Temp(Src) 98.9 F (37.2 C) (Oral)  Resp 28  Ht 5\' 1"  (1.549 m)  Wt 61 kg (134 lb 7.7 oz)  BMI 25.42 kg/m2  SpO2 95%  Intake/Output Summary (Last 24 hours) at 02/01/15 0743 Last data filed at 02/01/15 0547  Gross per 24 hour  Intake    815 ml  Output    710 ml  Net    105 ml   Weight change: -0.509 kg (-1 lb 1.9 oz)  Physical Exam: Pale, sallow complected  appearing female, very pleasant, lying in bed WUG:[id number] regular in rate and rhythm Tachy around 120 S1 and S2 normal  No S3 or S4.  Soft murmur LSB  No diastolic murmur Resp:CTA anteriorly Breath sounds somewhat diminished at bases posteriorly IHW:TUUE, flat, nontender Ext: 1+ edema bilat lower extremities Left upper arm AVF with + bruit, some bruising of arm, warm hand, good grip. Incision clean and dry. RIJ TDC   Labs: BMET  Recent Labs Lab 01/26/15 0410 01/27/15 0448 01/28/15 0514 01/29/15 0332 01/30/15 0326 01/31/15 0514 02/01/15 0307  NA 129* 126*  125* 126* 125* 119* 132* 130*  K 3.6 3.9  3.9 4.1 4.2 4.4 3.7 4.4  CL 93* 91*  91* 93* 93* 86* 96* 94*  CO2 26 26  26 26 25 23 28 27   GLUCOSE 203* 203*  203* 181* 204* 195* 219* 211*  BUN 17 22*  22* 15 22* 32* 20 27*  CREATININE 3.08* 4.31*  4.33* 2.70* 3.59* 4.45* 2.99* 3.83*  CALCIUM 7.5* 7.7*  7.7* 7.5* 8.0* 7.8* 8.0* 8.6*  PHOS 3.7 3.7 3.2 3.7 4.9* 4.0 5.7*     Recent Labs Lab 01/28/15 1105 01/30/15 0326 01/31/15 1430  WBC 9.8 7.2 6.7  HGB 7.9* 7.5* 7.9*  HCT 24.6* 23.3* 25.2*  MCV 95.7 93.2 96.6  PLT 207 186 186   Results for Pamela [name], Pamela [name] (MRN [id number]) as of 01/31/2015 08:56  Ref. Range  01/30/2015 12:30  Iron Latest Ref Range: 28-170 ug/dL 30  UIBC Latest Units: ug/dL 915  TIBC Latest Ref Range: 250-450 ug/dL 056 (L)  Saturation Ratios Latest Ref Range: 10.4-31.8 % 14  Ferritin Latest Ref Range: 11-307 ng/mL 259    Medications:    . antiseptic oral rinse  7 mL Mouth Rinse BID  . aspirin  81 mg Oral Daily  . atorvastatin  10 mg Oral q1800  . calcium acetate  667 mg Oral TID WC  . carvedilol  12.5 mg Oral BID WC  . darbepoetin (ARANESP) injection - NON-DIALYSIS  100 mcg Subcutaneous Q Fri-1800  . dextromethorphan-guaiFENesin  1 tablet Oral BID  . feeding supplement (NEPRO CARB STEADY)  237 mL Oral Q24H  . ferric gluconate (FERRLECIT/NULECIT) IV  125 mg Intravenous Q M,W,F-HD  . heparin subcutaneous  5,000 Units Subcutaneous 3 times per day  . insulin aspart  0-5 Units Subcutaneous QHS  . insulin aspart  0-9 Units Subcutaneous TID WC  . insulin aspart  5 Units Subcutaneous TID WC  . insulin glargine  12 Units Subcutaneous Daily  . latanoprost  1 drop Both  Eyes QHS  . losartan  50 mg Oral QHS  . multivitamin  1 tablet Oral QHS  . salmeterol  1 puff Inhalation BID    Assessment/ Plan:    1. 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  Short tmt today for volume Will be able to start at the outpt unit on Thursday CLIPPED to [facility] on [employer], TTS schedule, arrive tomorrow at 11:30 AM to sign consents Post HD weight today will be "starting" EDW but will need further lowering as outpt Has TDC and L BC AVF (9/20) 2. Sepsis Culture data all negative Completed empiric antibiotic course for management of sepsis 3. Anemia On ESA with low hemoglobin  Darbe at 100/week Starting iron load with HD (dose #1 today)  4. Secondary hyperparathyroidism PTH 102 and not on vit D (goal for stage 5 CKD 150-300) Ca  acetate as phos binder 5. HTN/volume Continue current BP meds Lowering volume with HD - EDW not yet established 6. CAD with chronic combined systolic and diastolic CHF EF 40-98% by ECHO Controlling volume with HD Re-educated about fluid restriction again yesterday 7. Acute hypoxic resp failure - resolved (s/p intub/steroids/atb's)    Camille Bal, MD [name] [phone] Pager 02/01/2015, 7:43 AM

## 2015-02-01 NOTE — Procedures (Signed)
I have personally attended this patient's dialysis session.   3 hour TMT for volume Will get full outpt treatment treatment tomorrow TDC at 400  Camille Bal, MD [facility] [phone] Pager 02/01/2015, 8:00 AM

## 2015-02-01 NOTE — Progress Notes (Signed)
CARDIAC REHAB PHASE I   PRE:  Rate/Rhythm: 95 SR    BP: sitting 145/56    SaO2: 98 RA  MODE:  Ambulation: 100 ft   POST:  Rate/Rhythm: 105 ST    BP: sitting 144/55     SaO2: 93 RA  Pt ambulated with RW independently (I used gait belt for safety). C/o fatigue due to HD this am after 50 ft and turned around. VSS. Ed reviewed/completed. Encouraged pt to increase walking daily at home. She is not interested in CRPII at this time. Husband is protective of her and voiced frustration that she walked too far one day. Encouraged pt to discuss diet with RD at HD center. Understands NTG. [id number]   Pamela [name] Dwight CES, ACSM 02/01/2015 2:40 PM

## 2015-02-01 NOTE — Progress Notes (Signed)
Inpatient Diabetes Program Recommendations  AACE/ADA: New Consensus Statement on Inpatient Glycemic Control (2015)  Target Ranges:  Prepandial:   less than 140 mg/dL      Peak postprandial:   less than 180 mg/dL (1-2 hours)      Critically ill patients:  140 - 180 mg/dL   Review of Glycemic Control  Diabetes history: DM 2 Outpatient Diabetes medications: Amaryl 2 mg Daily, Metformin 1,000-500-1,000 TID Current orders for Inpatient glycemic control: Lantus 12 units Daily, Novolog Sensitive + HS scale, Novolog 5 units meal coverage  Inpatient Diabetes Program Recommendations:  Patient did not receive basal insulin yesterday due to being in a procedural area. Patient currently in HD. Pt needs basal insulin dose as soon as she comes back from HD.  Noted patient is on Metformin TID at home. This is contraindicated in patients with renal complications. Please consider discontinuing this medication when discharged. Patient may need alternative oral medication. Please have patient follow up with her glucose levels after discharge with the MD that manages her DM.   Thanks,  Christena Deem RN, MSN, [facility] Inpatient Diabetes Coordinator Team Pager [phone] (8a-5p)

## 2015-02-01 NOTE — Telephone Encounter (Signed)
LM for pt re appt, dpm °

## 2015-02-01 NOTE — Care Management Note (Signed)
Case Management Note  Patient Details  Name: Pamela [name] MRN: [id number] Date of Birth: Apr 19, 1951  Subjective/Objective:      Admitted with Acute Resp Failure              Action/Plan: Talked to patient with spouse present about DCP, HHC choices, patient refused all HHC services at this time. Spouse stated that he took care of a brother with disabilities and he knows how to take care of his wife at home. PCP is Dr Leticia Clas. Patient has private insurance with [facility] with prescription drug coverage. [facility]acy of choice is [facility] and she does not have any problem getting her medication. No DME needed - she has a walker, cane and elevated commode chair at home. No needs identified.  Expected Discharge Date:    possibly 02/01/2015              Expected Discharge Plan:  Home/Self Care  Discharge planning Services  CM Consult  HH Arranged:  Patient Refused  Status of Service:  In process, will continue to follow  Medicare Important Message Given:  Yes-fourth notification given  Ma, Oursler [phone] 02/01/2015, 11:28 AM

## 2015-02-08 ENCOUNTER — Telehealth: Payer: Self-pay | Admitting: Interventional Cardiology

## 2015-02-08 NOTE — Telephone Encounter (Signed)
Returned pt husbands call. Pt gave verbal ok for me to speak with her husband. Pt is requesting her post hosp f/u be with Dr.Smith only. Pt has been doing ok since, d/c from [facility]. Pt is now on dialysis every Mon,Wed,Fri. appt rescheduled with Dr.Smith for 10/4 @ 12pm. Pt and pt husband voiced appreciation for the call back and verbalized understanding.

## 2015-02-08 NOTE — Telephone Encounter (Signed)
New message  Pt husband called req a call back. States that there are some things that he would like to discuss with the nurse. No further details

## 2015-02-10 ENCOUNTER — Other Ambulatory Visit: Payer: Self-pay

## 2015-02-14 ENCOUNTER — Ambulatory Visit (INDEPENDENT_AMBULATORY_CARE_PROVIDER_SITE_OTHER): Payer: Medicare Other | Admitting: Interventional Cardiology

## 2015-02-14 ENCOUNTER — Encounter: Payer: Self-pay | Admitting: Interventional Cardiology

## 2015-02-14 VITALS — BP 170/80 | HR 81 | Ht 61.5 in | Wt 121.0 lb

## 2015-02-14 DIAGNOSIS — I25110 Atherosclerotic heart disease of native coronary artery with unstable angina pectoris: Secondary | ICD-10-CM | POA: Diagnosis not present

## 2015-02-14 DIAGNOSIS — J449 Chronic obstructive pulmonary disease, unspecified: Secondary | ICD-10-CM

## 2015-02-14 DIAGNOSIS — N184 Chronic kidney disease, stage 4 (severe): Secondary | ICD-10-CM | POA: Diagnosis not present

## 2015-02-14 DIAGNOSIS — I10 Essential (primary) hypertension: Secondary | ICD-10-CM | POA: Diagnosis not present

## 2015-02-14 DIAGNOSIS — I5022 Chronic systolic (congestive) heart failure: Secondary | ICD-10-CM

## 2015-02-14 MED ORDER — CARVEDILOL 25 MG PO TABS: 25.0000 mg | ORAL_TABLET | Freq: Two times a day (BID) | ORAL | Status: DC

## 2015-02-14 NOTE — Progress Notes (Signed)
Cardiology Office Note   Date:  02/14/2015   ID:  Pamela [name], DOB 01-23-1951, MRN [id number]  PCP:  Georgann Housekeeper, MD  Cardiologist:  Lesleigh Noe, MD   Chief Complaint  Patient presents with  . Coronary Artery Disease      History of Present Illness: Pamela [name] is a 64 y.o. female who presents for CAD, new acute systolic heart failure,acute kidney injury leading to chronic end-stage renal disease on hemodialysis, hypertension, COPD, and diabetes mellitus.  She was admitted to the hospital in August with sepsis from UTI. She subsequently developed septic shock, acute kidney injury that led to chronic end-stage kidney disease, and decreased left ventricular systolic function. Echo performed during the hospital stay revealed an EF of 20-30%. She is currently tolerating dialysis without difficulty. She has chronic shortness of breath. She is back home. She is ambulating without oxygen. She denies chest discomfort.  Past Medical History  Diagnosis Date  . PONV (postoperative nausea and vomiting)   . Hypertension   . COPD (chronic obstructive pulmonary disease) (HCC)   . Diabetes mellitus without complication (HCC) 10-29-12  . Coronary artery disease   . Shortness of breath dyspnea   . Anxiety   . Chronic kidney disease     Past Surgical History  Procedure Laterality Date  . Abdominal hysterectomy    . Elbow surgery Right     tendon surgery  . Tubal ligation    . Breast surgery      cyst removed  . Cholecystectomy      '74-open  . Appendectomy      '74- open with gallbladder  . Cardiac catheterization      1 coronary stent placed  . Ganglion cyst excision Bilateral 10-29-12    wrist  . Anal fissure repair    . Colonoscopy with propofol N/A 11/17/2012    Procedure: COLONOSCOPY WITH PROPOFOL;  Surgeon: Charolett Bumpers, MD;  Location: [facility] ENDOSCOPY;  Service: Endoscopy;  Laterality: N/A;  . Esophagogastroduodenoscopy (egd) with propofol N/A 11/17/2012   Procedure: ESOPHAGOGASTRODUODENOSCOPY (EGD) WITH PROPOFOL;  Surgeon: Charolett Bumpers, MD;  Location: [facility] ENDOSCOPY;  Service: Endoscopy;  Laterality: N/A;  . Left heart catheterization with coronary angiogram N/A 04/14/2014    Procedure: LEFT HEART CATHETERIZATION WITH CORONARY ANGIOGRAM;  Surgeon: Lesleigh Noe, MD;  Location: [facility] CATH LAB;  Service: Cardiovascular;  Laterality: N/A;  . Tee without cardioversion N/A 01/20/2015    Procedure: TRANSESOPHAGEAL ECHOCARDIOGRAM (TEE);  Surgeon: Pricilla Riffle, MD;  Location: [facility] ENDOSCOPY;  Service: Cardiovascular;  Laterality: N/A;  . Av fistula placement Left 01/31/2015    Procedure: ARTERIOVENOUS FISTULA CREATION-LEFT BRACHIO-CEPHALIC ;  Surgeon: Fransisco Hertz, MD;  Location: [facility] OR;  Service: Vascular;  Laterality: Left;     Current Outpatient Prescriptions  Medication Sig Dispense Refill  . ALPRAZolam (XANAX) 0.5 MG tablet Take 1 tablet (0.5 mg total) by mouth at bedtime.  0  . aspirin EC 81 MG tablet Take 81 mg by mouth daily.    Marland Kitchen atorvastatin (LIPITOR) 10 MG tablet Take 10 mg by mouth every morning.    . calcium acetate (PHOSLO) 667 MG capsule Take 1 capsule (667 mg total) by mouth 3 (three) times daily with meals. 90 capsule 0  . carvedilol (COREG) 12.5 MG tablet Take 1 tablet (12.5 mg total) by mouth 2 (two) times daily with a meal. 60 tablet 0  . glimepiride (AMARYL) 4 MG tablet Take 8 mg by mouth daily.  0  . latanoprost (XALATAN) 0.005 % ophthalmic solution Place 1 drop into both eyes at bedtime.    Marland Kitchen losartan (COZAAR) 50 MG tablet Take 1 tablet (50 mg total) by mouth at bedtime. 30 tablet 0  . multivitamin (RENA-VIT) TABS tablet Take 1 tablet by mouth daily.  5  . nitroGLYCERIN (NITROSTAT) 0.4 MG SL tablet Place 0.4 mg under the tongue every 5 (five) minutes as needed for chest pain.    . Nutritional Supplements (FEEDING SUPPLEMENT, NEPRO CARB STEADY,) LIQD Take 237 mLs by mouth daily. 30 Can 0  . PROAIR HFA 108 (90 BASE) MCG/ACT inhaler  Inhale 2 puffs into the lungs every 4 (four) hours as needed for wheezing or shortness of breath.     . salmeterol (SEREVENT) 50 MCG/DOSE diskus inhaler Inhale 1 puff into the lungs 2 (two) times daily.     No current facility-administered medications for this visit.    Allergies:   Review of patient's allergies indicates no known allergies.    Social History:  The patient  reports that she quit smoking about 22 years ago. Her smoking use included Cigarettes. She smoked 1.50 packs per day. She has never used smokeless tobacco. She reports that she does not drink alcohol or use illicit drugs.   Family History:  The patient's family history includes Cancer in her father and sister; Diabetes in her father and sister; Heart attack in her mother; Hypertension in her father, mother, sister, and sister.    ROS:  Please see the history of present illness.   Otherwise, review of systems are positive for unexplained weight gain, leg swelling, dyspnea on exertion, wheezing, excessive fatigue, difficulty urinating, and excessive sweating..   All other systems are reviewed and negative.    PHYSICAL EXAM: VS:  BP 170/80 mmHg  Pulse 81  Ht 5' 1.5" (1.562 m)  Wt 54.885 kg (121 lb)  BMI 22.50 kg/m2  SpO2 95% , BMI Body mass index is 22.5 kg/(m^2). GEN: Well nourished, well developed, in no acute distress HEENT: normal Neck: no JVD, carotid bruits, or masses Cardiac: RRR.  There is no murmur, rub, or gallop. There is no edema. Respiratory:  clear to auscultation bilaterally, normal work of breathing. GI: soft, nontender, nondistended, + BS MS: no deformity or atrophy Skin: warm and dry, no rash Neuro:  Strength and sensation are intact Psych: euthymic mood, full affect   EKG:  EKG is not ordered today. The ekg reveals from 01/17/15 reveals sinus rhythm, nonspecific T wave flattening, and prominent voltage.   Recent Labs: 03/25/2014: Pro B Natriuretic peptide (BNP) 284.0* 01/14/2015: B Natriuretic  Peptide 2160.6* 01/17/2015: TSH 1.486 01/18/2015: ALT 19 02/01/2015: BUN 27*; Creatinine, Ser 3.83*; Hemoglobin 8.2*; Magnesium 2.0; Platelets 210; Potassium 4.4; Sodium 130*    Lipid Panel    Component Value Date/Time   TRIG 102 01/20/2015 0908      Wt Readings from Last 3 Encounters:  02/14/15 54.885 kg (121 lb)  02/01/15 58.2 kg (128 lb 4.9 oz)  01/12/15 50.803 kg (112 lb)      Other studies Reviewed: Additional studies/ records that were reviewed today include: review of extended hospital stay. The findings include note end-stage kidney disease now on hemodialysis. No dramatic decrease in LVEF 20-30%..    ASSESSMENT AND PLAN:  1. Coronary artery disease involving native coronary artery of native heart with unstable angina pectoris (HCC) No angina  2. Essential hypertension Marked elevation  3. COPD, severe (HCC) COPD with chronic dyspnea  4.  Chronic kidney disease, stage IV (severe) (HCC) Stage V kidney disease now on hemodialysis  5. Chronic systolic heart failure (HCC) LVEF 20-30% with dyspnea, multifactorial. Not currently on a strong heart failure regimen.    Current medicines are reviewed at length with the patient today.  The patient has the following concerns regarding medicines: all antihypertensive therapy with the exception of carvedilol has been discontinued.  The following changes/actions have been instituted:    Increase carvedilol to 25 mg twice a day  If tolerates increase in beta blocker therapy, we may add hydralazine/nitrate therapy.  2-D Doppler echocardiogram in 3 months prior to the next office visit  Labs/ tests ordered today include:  No orders of the defined types were placed in this encounter.     Disposition:   FU with HS in 3 months  Signed, Lesleigh Noe, MD  02/14/2015 12:28 PM    [facility] [address] [place], [place], [place]  [address] Phone: [phone]; Fax: [phone]

## 2015-02-14 NOTE — Patient Instructions (Addendum)
Medication Instructions:  Your physician has recommended you make the following change in your medication:  INCREASE Carvedilol 25mg  Twice daily. An Rx has been sent to your mail order pharmacy   Labwork: None ordered  Testing/Procedures: Your physician has requested that you have an echocardiogram. Echocardiography is a painless test that uses sound waves to create images of your heart. It provides your doctor with information about the size and shape of your heart and how well your heart's chambers and valves are working. This procedure takes approximately one hour. There are no restrictions for this procedure. (To be scheduled prior to scheduled follow up)  Follow-Up: You have a follow up appointment scheduled on 04/26/15 @ 9:45am  Any Other Special Instructions Will Be Listed Below (If Applicable).

## 2015-02-20 ENCOUNTER — Encounter: Payer: Medicare Other | Admitting: Nurse Practitioner

## 2015-03-03 ENCOUNTER — Encounter: Payer: Self-pay | Admitting: Vascular Surgery

## 2015-03-08 ENCOUNTER — Ambulatory Visit (INDEPENDENT_AMBULATORY_CARE_PROVIDER_SITE_OTHER): Payer: Medicare Other | Admitting: Vascular Surgery

## 2015-03-08 ENCOUNTER — Encounter: Payer: Self-pay | Admitting: Vascular Surgery

## 2015-03-08 ENCOUNTER — Other Ambulatory Visit: Payer: Self-pay

## 2015-03-08 VITALS — BP 152/79 | HR 82 | Ht 61.5 in | Wt 117.0 lb

## 2015-03-08 DIAGNOSIS — N186 End stage renal disease: Secondary | ICD-10-CM

## 2015-03-08 DIAGNOSIS — Z992 Dependence on renal dialysis: Secondary | ICD-10-CM

## 2015-03-08 NOTE — Progress Notes (Signed)
Postoperative Access Visit   History of Present Illness  Pamela [name] is a 64 y.o. year old female who presents for postoperative follow-up for: L BC AVF (Date: 01/31/15).  The patient's wounds are healed.  The patient notes no steal symptoms.  The patient is able to complete their activities of daily living.  The patient's current symptoms are: none.  Past Medical History  Diagnosis Date  . PONV (postoperative nausea and vomiting)   . Hypertension   . COPD (chronic obstructive pulmonary disease) (HCC)   . Diabetes mellitus without complication (HCC) 10-29-12  . Coronary artery disease   . Shortness of breath dyspnea   . Anxiety   . Chronic kidney disease     Past Surgical History  Procedure Laterality Date  . Abdominal hysterectomy    . Elbow surgery Right     tendon surgery  . Tubal ligation    . Breast surgery      cyst removed  . Cholecystectomy      '74-open  . Appendectomy      '74- open with gallbladder  . Cardiac catheterization      1 coronary stent placed  . Ganglion cyst excision Bilateral 10-29-12    wrist  . Anal fissure repair    . Colonoscopy with propofol N/A 11/17/2012    Procedure: COLONOSCOPY WITH PROPOFOL;  Surgeon: Charolett Bumpers, MD;  Location: [facility] ENDOSCOPY;  Service: Endoscopy;  Laterality: N/A;  . Esophagogastroduodenoscopy (egd) with propofol N/A 11/17/2012    Procedure: ESOPHAGOGASTRODUODENOSCOPY (EGD) WITH PROPOFOL;  Surgeon: Charolett Bumpers, MD;  Location: [facility];  Service: Endoscopy;  Laterality: N/A;  . Left heart catheterization with coronary angiogram N/A 04/14/2014    Procedure: LEFT HEART CATHETERIZATION WITH CORONARY ANGIOGRAM;  Surgeon: Lesleigh Noe, MD;  Location: [facility] CATH LAB;  Service: Cardiovascular;  Laterality: N/A;  . Tee without cardioversion N/A 01/20/2015    Procedure: TRANSESOPHAGEAL ECHOCARDIOGRAM (TEE);  Surgeon: Pricilla Riffle, MD;  Location: [facility] ENDOSCOPY;  Service: Cardiovascular;  Laterality: N/A;  . Av fistula  placement Left 01/31/2015    Procedure: ARTERIOVENOUS FISTULA CREATION-LEFT BRACHIO-CEPHALIC ;  Surgeon: Fransisco Hertz, MD;  Location: [facility] OR;  Service: Vascular;  Laterality: Left;    Social History   Social History  . Marital Status: Married    Spouse Name: N/A  . Number of Children: N/A  . Years of Education: N/A   Occupational History  . Not on file.   Social History Main Topics  . Smoking status: Former Smoker -- 1.50 packs/day    Types: Cigarettes    Quit date: 10/29/1992  . Smokeless tobacco: Never Used  . Alcohol Use: No  . Drug Use: No  . Sexual Activity: Not Currently   Other Topics Concern  . Not on file   Social History Narrative    Family History  Problem Relation Age of Onset  . Diabetes Father   . Cancer Father   . Hypertension Father   . Heart attack Mother   . Hypertension Mother   . Diabetes Sister   . Hypertension Sister   . Cancer Sister   . Hypertension Sister     Current Outpatient Prescriptions  Medication Sig Dispense Refill  . ALPRAZolam (XANAX) 0.5 MG tablet Take 1 tablet (0.5 mg total) by mouth at bedtime.  0  . aspirin EC 81 MG tablet Take 81 mg by mouth daily.    Marland Kitchen atorvastatin (LIPITOR) 10 MG tablet Take 10 mg by  mouth every morning.    . calcium acetate (PHOSLO) 667 MG capsule Take 1 capsule (667 mg total) by mouth 3 (three) times daily with meals. 90 capsule 0  . carvedilol (COREG) 25 MG tablet Take 1 tablet (25 mg total) by mouth 2 (two) times daily with a meal. 180 tablet 1  . glimepiride (AMARYL) 4 MG tablet Take 8 mg by mouth daily.  0  . latanoprost (XALATAN) 0.005 % ophthalmic solution Place 1 drop into both eyes at bedtime.    Marland Kitchen losartan (COZAAR) 50 MG tablet Take 1 tablet (50 mg total) by mouth at bedtime. 30 tablet 0  . multivitamin (RENA-VIT) TABS tablet Take 1 tablet by mouth daily.  5  . nitroGLYCERIN (NITROSTAT) 0.4 MG SL tablet Place 0.4 mg under the tongue every 5 (five) minutes as needed for chest pain.    .  Nutritional Supplements (FEEDING SUPPLEMENT, NEPRO CARB STEADY,) LIQD Take 237 mLs by mouth daily. 30 Can 0  . PROAIR HFA 108 (90 BASE) MCG/ACT inhaler Inhale 2 puffs into the lungs every 4 (four) hours as needed for wheezing or shortness of breath.     . salmeterol (SEREVENT) 50 MCG/DOSE diskus inhaler Inhale 1 puff into the lungs 2 (two) times daily.    Marland Kitchen ACCU-CHEK AVIVA PLUS test strip     . amoxicillin-clavulanate (AUGMENTIN) 500-125 MG tablet     . calcium acetate (PHOSLO) 667 MG tablet     . furosemide (LASIX) 40 MG tablet     . LANTUS SOLOSTAR 100 UNIT/ML Solostar Pen     . metFORMIN (GLUCOPHAGE) 500 MG tablet     . predniSONE (STERAPRED UNI-PAK 21 TAB) 10 MG (21) TBPK tablet      No current facility-administered medications for this visit.     No Known Allergies   REVIEW OF SYSTEMS:  (Positives checked otherwise negative)  CARDIOVASCULAR:    chest pain,   chest pressure,   palpitations,   shortness of breath when laying flat,   shortness of breath with exertion,    pain in feet when walking,   pain in feet when laying flat,  history of blood clot in veins (DVT),   history of phlebitis,   swelling in legs,   varicose veins  PULMONARY:    productive cough,   asthma,   wheezing  NEUROLOGIC:    weakness in arms or legs,   numbness in arms or legs,   difficulty speaking or slurred speech,   temporary loss of vision in one eye,   dizziness  HEMATOLOGIC:    bleeding problems,   problems with blood clotting too easily  MUSCULOSKEL:    joint pain,  joint swelling  GASTROINTEST:    vomiting blood,   blood in stool     GENITOURINARY:    burning with urination,   blood in urine  ESRD-HD: M-W-F  PSYCHIATRIC:    history of major depression  INTEGUMENTARY:    rashes,   ulcers  CONSTITUTIONAL:    fever,   chills    Physical Examination Filed Vitals:    03/08/15 0826  BP: 152/79  Pulse:    Pulmonary: Sym exp, good air movt, CTAB, no rales, rhonchi, & wheezing  Cardiac: RRR, Nl S1, S2, no Murmurs, rubs or gallops  LUE: Incision is healed, skin  feels warm, hand grip is 5/5, sensation in digits is intact, palpable thrill, bruit can be auscultated: wheezy, proximal 1/3 stenosis send on Sonosite  Medical Decision Making  CANDINA SOTTO is a 64 y.o. year old female who presents s/p L BC AVF complicated with upper 1/3 stenotic segment.   I had concerns about this access intraoperatively and her post-operative exam demonstrates a L BC AVF in the process of occluding.  Will try to salvage with L arm fistulogram, likely intervention.  Will need to try a balloon assisted maturation technique to salvage this fistula.  I discussed with the patient the nature of angiographic procedures, especially the limited patencies of any endovascular intervention.    The patient is aware of that the risks of an angiographic procedure include but are not limited to: bleeding, infection, access site complications, renal failure, embolization, rupture of vessel, dissection, possible need for emergent surgical intervention, possible need for surgical procedures to treat the patient's pathology, anaphylactic reaction to contrast, and stroke and death.    The patient is aware of the risks and agrees to proceed.  Thank you for allowing [place] to participate in this patient's care.  Leonides Sake, MD [facility] Office: [phone] Pager: [phone]  03/08/2015, 8:44 AM

## 2015-03-09 ENCOUNTER — Encounter (HOSPITAL_COMMUNITY)
Admission: RE | Disposition: A | Discharge status: Home / Self Care | Payer: Self-pay | Source: Ambulatory Visit | Attending: Vascular Surgery

## 2015-03-09 ENCOUNTER — Encounter (HOSPITAL_COMMUNITY): Payer: Self-pay | Admitting: Vascular Surgery

## 2015-03-09 ENCOUNTER — Ambulatory Visit (HOSPITAL_COMMUNITY)
Admission: RE | Admit: 2015-03-09 | Discharge: 2015-03-09 | Disposition: A | Discharge status: Home / Self Care | Payer: Medicare Other | Source: Ambulatory Visit | Attending: Vascular Surgery | Admitting: Vascular Surgery

## 2015-03-09 DIAGNOSIS — Z7984 Long term (current) use of oral hypoglycemic drugs: Secondary | ICD-10-CM | POA: Diagnosis not present

## 2015-03-09 DIAGNOSIS — Z794 Long term (current) use of insulin: Secondary | ICD-10-CM | POA: Diagnosis not present

## 2015-03-09 DIAGNOSIS — Z87891 Personal history of nicotine dependence: Secondary | ICD-10-CM | POA: Insufficient documentation

## 2015-03-09 DIAGNOSIS — N186 End stage renal disease: Secondary | ICD-10-CM | POA: Diagnosis not present

## 2015-03-09 DIAGNOSIS — Z992 Dependence on renal dialysis: Secondary | ICD-10-CM | POA: Diagnosis not present

## 2015-03-09 DIAGNOSIS — E1122 Type 2 diabetes mellitus with diabetic chronic kidney disease: Secondary | ICD-10-CM | POA: Insufficient documentation

## 2015-03-09 DIAGNOSIS — Z7982 Long term (current) use of aspirin: Secondary | ICD-10-CM | POA: Insufficient documentation

## 2015-03-09 DIAGNOSIS — J449 Chronic obstructive pulmonary disease, unspecified: Secondary | ICD-10-CM | POA: Insufficient documentation

## 2015-03-09 DIAGNOSIS — T82858A Stenosis of vascular prosthetic devices, implants and grafts, initial encounter: Secondary | ICD-10-CM | POA: Insufficient documentation

## 2015-03-09 DIAGNOSIS — Z8249 Family history of ischemic heart disease and other diseases of the circulatory system: Secondary | ICD-10-CM | POA: Diagnosis not present

## 2015-03-09 DIAGNOSIS — I120 Hypertensive chronic kidney disease with stage 5 chronic kidney disease or end stage renal disease: Secondary | ICD-10-CM | POA: Insufficient documentation

## 2015-03-09 DIAGNOSIS — N184 Chronic kidney disease, stage 4 (severe): Secondary | ICD-10-CM | POA: Diagnosis present

## 2015-03-09 DIAGNOSIS — Y832 Surgical operation with anastomosis, bypass or graft as the cause of abnormal reaction of the patient, or of later complication, without mention of misadventure at the time of the procedure: Secondary | ICD-10-CM | POA: Diagnosis not present

## 2015-03-09 DIAGNOSIS — I2510 Atherosclerotic heart disease of native coronary artery without angina pectoris: Secondary | ICD-10-CM | POA: Insufficient documentation

## 2015-03-09 DIAGNOSIS — F419 Anxiety disorder, unspecified: Secondary | ICD-10-CM | POA: Diagnosis not present

## 2015-03-09 DIAGNOSIS — T82898A Other specified complication of vascular prosthetic devices, implants and grafts, initial encounter: Secondary | ICD-10-CM | POA: Diagnosis not present

## 2015-03-09 HISTORY — PX: PERIPHERAL VASCULAR CATHETERIZATION: SHX172C

## 2015-03-09 LAB — POCT I-STAT, CHEM 8
BUN: 47 mg/dL — ABNORMAL HIGH (ref 6–20)
CHLORIDE: 99 mmol/L — AB (ref 101–111)
Calcium, Ion: 1.14 mmol/L (ref 1.13–1.30)
Creatinine, Ser: 3 mg/dL — ABNORMAL HIGH (ref 0.44–1.00)
Glucose, Bld: 65 mg/dL (ref 65–99)
HEMATOCRIT: 49 % — AB (ref 36.0–46.0)
HEMOGLOBIN: 16.7 g/dL — AB (ref 12.0–15.0)
POTASSIUM: 4.4 mmol/L (ref 3.5–5.1)
SODIUM: 134 mmol/L — AB (ref 135–145)
TCO2: 26 mmol/L (ref 0–100)

## 2015-03-09 LAB — GLUCOSE, CAPILLARY
GLUCOSE-CAPILLARY: 116 mg/dL — AB (ref 65–99)
GLUCOSE-CAPILLARY: 128 mg/dL — AB (ref 65–99)
Glucose-Capillary: 61 mg/dL — ABNORMAL LOW (ref 65–99)

## 2015-03-09 SURGERY — A/V SHUNTOGRAM/FISTULAGRAM
Anesthesia: LOCAL

## 2015-03-09 MED ORDER — SODIUM CHLORIDE 0.9 % IJ SOLN: 3.0000 mL | Freq: Two times a day (BID) | INTRAMUSCULAR | Status: DC

## 2015-03-09 MED ORDER — SODIUM CHLORIDE 0.9 % IJ SOLN: 3.0000 mL | INTRAMUSCULAR | Status: DC | PRN

## 2015-03-09 MED ORDER — HEPARIN SODIUM (PORCINE) 1000 UNIT/ML IJ SOLN
INTRAMUSCULAR | Status: DC | PRN
  Administered 2015-03-09: 3000 [IU] via INTRAVENOUS

## 2015-03-09 MED ORDER — ONDANSETRON HCL 4 MG/2ML IJ SOLN
INTRAMUSCULAR | Status: AC
  Filled 2015-03-09: qty 2

## 2015-03-09 MED ORDER — HEPARIN (PORCINE) IN NACL 2-0.9 UNIT/ML-% IJ SOLN
INTRAMUSCULAR | Status: AC
  Filled 2015-03-09: qty 500

## 2015-03-09 MED ORDER — HYDRALAZINE HCL 20 MG/ML IJ SOLN
INTRAMUSCULAR | Status: DC | PRN
  Administered 2015-03-09: 10 mg via INTRAVENOUS

## 2015-03-09 MED ORDER — SODIUM CHLORIDE 0.9 % IV SOLN: 250.0000 mL | INTRAVENOUS | Status: DC | PRN

## 2015-03-09 MED ORDER — ONDANSETRON HCL 4 MG/2ML IJ SOLN: 4.0000 mg | Freq: Four times a day (QID) | INTRAMUSCULAR | Status: DC | PRN

## 2015-03-09 MED ORDER — LIDOCAINE HCL (PF) 1 % IJ SOLN
INTRAMUSCULAR | Status: DC | PRN
  Administered 2015-03-09: 08:00:00

## 2015-03-09 MED ORDER — DEXTROSE 50 % IV SOLN
25.0000 mL | INTRAVENOUS | Status: DC | PRN
  Administered 2015-03-09: 25 mL via INTRAVENOUS

## 2015-03-09 MED ORDER — HYDRALAZINE HCL 20 MG/ML IJ SOLN: 10.0000 mg | INTRAMUSCULAR | Status: DC | PRN

## 2015-03-09 MED ORDER — PROMETHAZINE HCL 25 MG/ML IJ SOLN: 12.5000 mg | Freq: Four times a day (QID) | INTRAMUSCULAR | Status: DC | PRN

## 2015-03-09 MED ORDER — ONDANSETRON HCL 4 MG/2ML IJ SOLN
INTRAMUSCULAR | Status: DC | PRN
  Administered 2015-03-09 (×2): 4 mg via INTRAVENOUS

## 2015-03-09 MED ORDER — LIDOCAINE HCL (PF) 1 % IJ SOLN
INTRAMUSCULAR | Status: AC
  Filled 2015-03-09: qty 30

## 2015-03-09 MED ORDER — FENTANYL CITRATE (PF) 100 MCG/2ML IJ SOLN
INTRAMUSCULAR | Status: DC | PRN
  Administered 2015-03-09: 50 ug via INTRAVENOUS

## 2015-03-09 MED ORDER — IODIXANOL 320 MG/ML IV SOLN
INTRAVENOUS | Status: DC | PRN
  Administered 2015-03-09: 25 mL via INTRAVENOUS

## 2015-03-09 MED ORDER — HYDRALAZINE HCL 20 MG/ML IJ SOLN
INTRAMUSCULAR | Status: AC
  Filled 2015-03-09: qty 1

## 2015-03-09 MED ORDER — DEXTROSE 50 % IV SOLN
INTRAVENOUS | Status: AC
  Administered 2015-03-09: 25 mL via INTRAVENOUS
  Filled 2015-03-09: qty 50

## 2015-03-09 MED ORDER — FENTANYL CITRATE (PF) 100 MCG/2ML IJ SOLN
INTRAMUSCULAR | Status: AC
  Filled 2015-03-09: qty 4

## 2015-03-09 SURGICAL SUPPLY — 15 items
BAG SNAP BAND KOVER 36X36 (MISCELLANEOUS) ×2 IMPLANT
BALLN MUSTANG 4X80X75 (BALLOONS) ×2
BALLOON MUSTANG 4X80X75 (BALLOONS) ×1 IMPLANT
COVER DOME SNAP 22 D (MISCELLANEOUS) ×2 IMPLANT
COVER PRB 48X5XTLSCP FOLD TPE (BAG) ×1 IMPLANT
COVER PROBE 5X48 (BAG) ×1
KIT ENCORE 26 ADVANTAGE (KITS) ×2 IMPLANT
KIT MICROINTRODUCER STIFF 5F (SHEATH) ×2 IMPLANT
PROTECTION STATION PRESSURIZED (MISCELLANEOUS) ×2
SHEATH PINNACLE R/O II 6F 4CM (SHEATH) ×2 IMPLANT
STATION PROTECTION PRESSURIZED (MISCELLANEOUS) ×1 IMPLANT
STOPCOCK MORSE 400PSI 3WAY (MISCELLANEOUS) ×2 IMPLANT
TRAY PV CATH (CUSTOM PROCEDURE TRAY) ×2 IMPLANT
TUBING CIL FLEX 10 FLL-RA (TUBING) ×2 IMPLANT
WIRE BENTSON .035X145CM (WIRE) ×4 IMPLANT

## 2015-03-09 NOTE — H&P (View-Only) (Signed)
Postoperative Access Visit   History of Present Illness  Pamela [name] is a 64 y.o. year old female who presents for postoperative follow-up for: L BC AVF (Date: 01/31/15).  The patient's wounds are healed.  The patient notes no steal symptoms.  The patient is able to complete their activities of daily living.  The patient's current symptoms are: none.  Past Medical History  Diagnosis Date  . PONV (postoperative nausea and vomiting)   . Hypertension   . COPD (chronic obstructive pulmonary disease) (HCC)   . Diabetes mellitus without complication (HCC) 10-29-12  . Coronary artery disease   . Shortness of breath dyspnea   . Anxiety   . Chronic kidney disease     Past Surgical History  Procedure Laterality Date  . Abdominal hysterectomy    . Elbow surgery Right     tendon surgery  . Tubal ligation    . Breast surgery      cyst removed  . Cholecystectomy      '74-open  . Appendectomy      '74- open with gallbladder  . Cardiac catheterization      1 coronary stent placed  . Ganglion cyst excision Bilateral 10-29-12    wrist  . Anal fissure repair    . Colonoscopy with propofol N/A 11/17/2012    Procedure: COLONOSCOPY WITH PROPOFOL;  Surgeon: Charolett Bumpers, MD;  Location: [facility];  Service: Endoscopy;  Laterality: N/A;  . Esophagogastroduodenoscopy (egd) with propofol N/A 11/17/2012    Procedure: ESOPHAGOGASTRODUODENOSCOPY (EGD) WITH PROPOFOL;  Surgeon: Charolett Bumpers, MD;  Location: [facility] ENDOSCOPY;  Service: Endoscopy;  Laterality: N/A;  . Left heart catheterization with coronary angiogram N/A 04/14/2014    Procedure: LEFT HEART CATHETERIZATION WITH CORONARY ANGIOGRAM;  Surgeon: Lesleigh Noe, MD;  Location: [facility] CATH LAB;  Service: Cardiovascular;  Laterality: N/A;  . Tee without cardioversion N/A 01/20/2015    Procedure: TRANSESOPHAGEAL ECHOCARDIOGRAM (TEE);  Surgeon: Pricilla Riffle, MD;  Location: [facility] ENDOSCOPY;  Service: Cardiovascular;  Laterality: N/A;  . Av fistula  placement Left 01/31/2015    Procedure: ARTERIOVENOUS FISTULA CREATION-LEFT BRACHIO-CEPHALIC ;  Surgeon: Fransisco Hertz, MD;  Location: [facility] OR;  Service: Vascular;  Laterality: Left;    Social History   Social History  . Marital Status: Married    Spouse Name: N/A  . Number of Children: N/A  . Years of Education: N/A   Occupational History  . Not on file.   Social History Main Topics  . Smoking status: Former Smoker -- 1.50 packs/day    Types: Cigarettes    Quit date: 10/29/1992  . Smokeless tobacco: Never Used  . Alcohol Use: No  . Drug Use: No  . Sexual Activity: Not Currently   Other Topics Concern  . Not on file   Social History Narrative    Family History  Problem Relation Age of Onset  . Diabetes Father   . Cancer Father   . Hypertension Father   . Heart attack Mother   . Hypertension Mother   . Diabetes Sister   . Hypertension Sister   . Cancer Sister   . Hypertension Sister     Current Outpatient Prescriptions  Medication Sig Dispense Refill  . ALPRAZolam (XANAX) 0.5 MG tablet Take 1 tablet (0.5 mg total) by mouth at bedtime.  0  . aspirin EC 81 MG tablet Take 81 mg by mouth daily.    Marland Kitchen atorvastatin (LIPITOR) 10 MG tablet Take 10 mg by  mouth every morning.    . calcium acetate (PHOSLO) 667 MG capsule Take 1 capsule (667 mg total) by mouth 3 (three) times daily with meals. 90 capsule 0  . carvedilol (COREG) 25 MG tablet Take 1 tablet (25 mg total) by mouth 2 (two) times daily with a meal. 180 tablet 1  . glimepiride (AMARYL) 4 MG tablet Take 8 mg by mouth daily.  0  . latanoprost (XALATAN) 0.005 % ophthalmic solution Place 1 drop into both eyes at bedtime.    . losartan (COZAAR) 50 MG tablet Take 1 tablet (50 mg total) by mouth at bedtime. 30 tablet 0  . multivitamin (RENA-VIT) TABS tablet Take 1 tablet by mouth daily.  5  . nitroGLYCERIN (NITROSTAT) 0.4 MG SL tablet Place 0.4 mg under the tongue every 5 (five) minutes as needed for chest pain.    .  Nutritional Supplements (FEEDING SUPPLEMENT, NEPRO CARB STEADY,) LIQD Take 237 mLs by mouth daily. 30 Can 0  . PROAIR HFA 108 (90 BASE) MCG/ACT inhaler Inhale 2 puffs into the lungs every 4 (four) hours as needed for wheezing or shortness of breath.     . salmeterol (SEREVENT) 50 MCG/DOSE diskus inhaler Inhale 1 puff into the lungs 2 (two) times daily.    . ACCU-CHEK AVIVA PLUS test strip     . amoxicillin-clavulanate (AUGMENTIN) 500-125 MG tablet     . calcium acetate (PHOSLO) 667 MG tablet     . furosemide (LASIX) 40 MG tablet     . LANTUS SOLOSTAR 100 UNIT/ML Solostar Pen     . metFORMIN (GLUCOPHAGE) 500 MG tablet     . predniSONE (STERAPRED UNI-PAK 21 TAB) 10 MG (21) TBPK tablet      No current facility-administered medications for this visit.     No Known Allergies   REVIEW OF SYSTEMS:  (Positives checked otherwise negative)  CARDIOVASCULAR:   [ ] chest pain,  [ ] chest pressure,  [ ] palpitations,  [ ] shortness of breath when laying flat,  [ ] shortness of breath with exertion,   [ ] pain in feet when walking,  [ ] pain in feet when laying flat, [ ] history of blood clot in veins (DVT),  [ ] history of phlebitis,  [ ] swelling in legs,  [ ] varicose veins  PULMONARY:   [ ] productive cough,  [ ] asthma,  [ ] wheezing  NEUROLOGIC:   [ ] weakness in arms or legs,  [ ] numbness in arms or legs,  [ ] difficulty speaking or slurred speech,  [ ] temporary loss of vision in one eye,  [ ] dizziness  HEMATOLOGIC:   [ ] bleeding problems,  [ ] problems with blood clotting too easily  MUSCULOSKEL:   [ ] joint pain, [ ] joint swelling  GASTROINTEST:   [ ] vomiting blood,  [ ] blood in stool     GENITOURINARY:   [ ] burning with urination,  [ ] blood in urine [x] ESRD-HD: M-W-F  PSYCHIATRIC:   [ ] history of major depression  INTEGUMENTARY:   [ ] rashes,  [ ] ulcers  CONSTITUTIONAL:   [ ] fever,  [ ] chills    Physical Examination Filed Vitals:    03/08/15 0826  BP: 152/79  Pulse:    Pulmonary: Sym exp, good air movt, CTAB, no rales, rhonchi, & wheezing  Cardiac: RRR, Nl S1, S2, no Murmurs, rubs or gallops  LUE: Incision is healed, skin   feels warm, hand grip is 5/5, sensation in digits is intact, palpable thrill, bruit can be auscultated: wheezy, proximal 1/3 stenosis send on Sonosite  Medical Decision Making  Pamela [name] is a 64 y.o. year old female who presents s/p L BC AVF complicated with upper 1/3 stenotic segment.   I had concerns about this access intraoperatively and her post-operative exam demonstrates a L BC AVF in the process of occluding.  Will try to salvage with L arm fistulogram, likely intervention.  Will need to try a balloon assisted maturation technique to salvage this fistula.  I discussed with the patient the nature of angiographic procedures, especially the limited patencies of any endovascular intervention.    The patient is aware of that the risks of an angiographic procedure include but are not limited to: bleeding, infection, access site complications, renal failure, embolization, rupture of vessel, dissection, possible need for emergent surgical intervention, possible need for surgical procedures to treat the patient's pathology, anaphylactic reaction to contrast, and stroke and death.    The patient is aware of the risks and agrees to proceed.  Thank you for allowing [place] to participate in this patient's care.  Leonides Sake, MD [facility] Office: [phone] Pager: [phone]  03/08/2015, 8:44 AM

## 2015-03-09 NOTE — Progress Notes (Signed)
Hypoglycemic Event  CBG: 61  Treatment: D50 IV 25 mL  Symptoms: None  Follow-up CBG: [id number] CBG Result:116  Possible Reasons for Event: Inadequate meal intake    Pamela [name]

## 2015-03-09 NOTE — Interval H&P Note (Signed)
History and Physical Interval Note:  03/09/2015 7:28 AM  Pamela [name]  has presented today for surgery, with the diagnosis of End stage Renal  The various methods of treatment have been discussed with the patient and family. After consideration of risks, benefits and other options for treatment, the patient has consented to  Procedure(s): Fistulagram (N/A) as a surgical intervention .  The patient's history has been reviewed, patient examined, no change in status, stable for surgery.  I have reviewed the patient's chart and labs.  Questions were answered to the patient's satisfaction.     Leonides Sake

## 2015-03-09 NOTE — Discharge Instructions (Signed)
Fistulogram, Care After °Refer to this sheet in the next few weeks. These instructions provide you with information on caring for yourself after your procedure. Your health care provider may also give you more specific instructions. Your treatment has been planned according to current medical practices, but problems sometimes occur. Call your health care provider if you have any problems or questions after your procedure. °WHAT TO EXPECT AFTER THE PROCEDURE °After your procedure, it is typical to have the following: °· A small amount of discomfort in the area where the catheters were placed. °· A small amount of bruising around the fistula. °· Sleepiness and fatigue. °HOME CARE INSTRUCTIONS °· Rest at home for the day following your procedure. °· Do not drive or operate heavy machinery while taking pain medicine. °· Take medicines only as directed by your health care provider. °· Do not take baths, swim, or use a hot tub until your health care provider approves. You may shower 24 hours after the procedure or as directed by your health care provider. °· There are many different ways to close and cover an incision, including stitches, skin glue, and adhesive strips. Follow your health care provider's instructions on: °¨ Incision care. °¨ Bandage (dressing) changes and removal. °¨ Incision closure removal. °· Monitor your dialysis fistula carefully. °SEEK MEDICAL CARE IF: °· You have drainage, redness, swelling, or pain at your catheter site. °· You have a fever. °· You have chills. °SEEK IMMEDIATE MEDICAL CARE IF: °· You feel weak. °· You have trouble balancing. °· You have trouble moving your arms or legs. °· You have problems with your speech or vision. °· You can no longer feel a vibration or buzz when you put your fingers over your dialysis fistula. °· The limb that was used for the procedure: °¨ Swells. °¨ Is painful. °¨ Is cold. °¨ Is discolored, such as blue or pale white. °  °This information is not intended  to replace advice given to you by your health care provider. Make sure you discuss any questions you have with your health care provider. °  °Document Released: 09/13/2013 Document Reviewed: 09/13/2013 °Elsevier Interactive Patient Education ©2016 [employer] ° °

## 2015-03-10 ENCOUNTER — Telehealth: Payer: Self-pay

## 2015-03-10 ENCOUNTER — Telehealth: Payer: Self-pay | Admitting: Vascular Surgery

## 2015-03-10 ENCOUNTER — Encounter: Payer: Medicare Other | Admitting: Vascular Surgery

## 2015-03-10 NOTE — Telephone Encounter (Signed)
LM for pt, dpm °

## 2015-03-10 NOTE — Telephone Encounter (Signed)
-----   Message from Sharee Pimple, RN sent at 03/09/2015  9:16 AM EDT ----- Regarding: Schedule   ----- Message -----    From: Fransisco Hertz, MD    Sent: 03/09/2015   8:37 AM      To: [facility] [address] NILAYA LUNDELL [id number] 1950-11-20   PROCEDURE: 1.  left brachiocephalic arteriovenous fistula cannulation under ultrasound guidance 2.  left arm fistulogram 3.  Venoplasty of cephalic vein (4 mm x 80 mm)  Follow-up: 4 weeks

## 2015-03-10 NOTE — Telephone Encounter (Signed)
Phone call from nurse at [facility]. Reported no bruit or thrill in left B-C AVF.  Discussed with Dr. Imogene Burn.  Recommended appt. In approx. 3 weeks to reeval. For new permanent access.  Appt. given for 04/12/15 @ 10:00 AM.  Appt. Information given to [facility], nurse @ [facility].

## 2015-03-27 ENCOUNTER — Encounter: Payer: Self-pay | Admitting: Vascular Surgery

## 2015-03-29 ENCOUNTER — Encounter: Payer: Medicare Other | Admitting: Vascular Surgery

## 2015-03-29 ENCOUNTER — Encounter: Payer: Self-pay | Admitting: Vascular Surgery

## 2015-03-29 ENCOUNTER — Other Ambulatory Visit: Payer: Self-pay

## 2015-03-29 ENCOUNTER — Ambulatory Visit (INDEPENDENT_AMBULATORY_CARE_PROVIDER_SITE_OTHER): Payer: Self-pay | Admitting: Vascular Surgery

## 2015-03-29 VITALS — BP 154/84 | HR 80 | Temp 97.2°F | Resp 14 | Ht 61.5 in | Wt 110.0 lb

## 2015-03-29 DIAGNOSIS — Z992 Dependence on renal dialysis: Secondary | ICD-10-CM

## 2015-03-29 DIAGNOSIS — N186 End stage renal disease: Secondary | ICD-10-CM

## 2015-03-29 NOTE — Progress Notes (Signed)
Filed Vitals:   03/29/15 0857 03/29/15 0859  BP: 147/82 154/84  Pulse: 84 80  Temp: 97.2 F (36.2 C)   Resp: 14   Height: 5' 1.5" (1.562 m)   Weight: 110 lb (49.896 kg)

## 2015-03-29 NOTE — Progress Notes (Signed)
[facility] [place] HISTORY AND PHYSICAL   History of Present Illness:  Patient is a 64 y.o. year old female who presents for placement of a permanent hemodialysis access. She has previously had a left brachiocephalic fistula which failed before using. She did have some mild tingling in her fingertips after placement of the fistula. She currently is dialyzing via a right-sided catheter on Monday Wednesday and Friday. This catheter has been in place since September when she started dialysis. The patient is right handed .  The patient is currently on hemodialysis Monday Wednesday and Friday.  Other chronic medical problems include hypertension, COPD, diabetes, coronary disease all of which are currently stable..  Past Medical History  Diagnosis Date  . PONV (postoperative nausea and vomiting)   . Hypertension   . COPD (chronic obstructive pulmonary disease) (HCC)   . Diabetes mellitus without complication (HCC) 10-29-12  . Coronary artery disease   . Shortness of breath dyspnea   . Anxiety   . Chronic kidney disease     Past Surgical History  Procedure Laterality Date  . Abdominal hysterectomy    . Elbow surgery Right     tendon surgery  . Tubal ligation    . Breast surgery      cyst removed  . Cholecystectomy      '74-open  . Appendectomy      '74- open with gallbladder  . Cardiac catheterization      1 coronary stent placed  . Ganglion cyst excision Bilateral 10-29-12    wrist  . Anal fissure repair    . Colonoscopy with propofol N/A 11/17/2012    Procedure: COLONOSCOPY WITH PROPOFOL;  Surgeon: Charolett Bumpers, MD;  Location: [facility] ENDOSCOPY;  Service: Endoscopy;  Laterality: N/A;  . Esophagogastroduodenoscopy (egd) with propofol N/A 11/17/2012    Procedure: ESOPHAGOGASTRODUODENOSCOPY (EGD) WITH PROPOFOL;  Surgeon: Charolett Bumpers, MD;  Location: [facility] ENDOSCOPY;  Service: Endoscopy;  Laterality: N/A;  . Left heart catheterization with coronary angiogram N/A  04/14/2014    Procedure: LEFT HEART CATHETERIZATION WITH CORONARY ANGIOGRAM;  Surgeon: Lesleigh Noe, MD;  Location: [facility] CATH LAB;  Service: Cardiovascular;  Laterality: N/A;  . Tee without cardioversion N/A 01/20/2015    Procedure: TRANSESOPHAGEAL ECHOCARDIOGRAM (TEE);  Surgeon: Pricilla Riffle, MD;  Location: [facility] ENDOSCOPY;  Service: Cardiovascular;  Laterality: N/A;  . Av fistula placement Left 01/31/2015    Procedure: ARTERIOVENOUS FISTULA CREATION-LEFT BRACHIO-CEPHALIC ;  Surgeon: Fransisco Hertz, MD;  Location: [facility] OR;  Service: Vascular;  Laterality: Left;  . Peripheral vascular catheterization N/A 03/09/2015    Procedure: Fistulagram;  Surgeon: Fransisco Hertz, MD;  Location: [facility] INVASIVE CV LAB;  Service: Cardiovascular;  Laterality: N/A;     Social History Social History  Substance Use Topics  . Smoking status: Former Smoker -- 1.50 packs/day    Types: Cigarettes    Quit date: 10/29/1992  . Smokeless tobacco: Never Used  . Alcohol Use: No    Family History Family History  Problem Relation Age of Onset  . Diabetes Father   . Cancer Father   . Hypertension Father   . Heart attack Mother   . Hypertension Mother   . Diabetes Sister   . Hypertension Sister   . Cancer Sister   . Hypertension Sister     Allergies  No Known Allergies   Current Outpatient Prescriptions  Medication Sig Dispense Refill  . ALPRAZolam (XANAX) 0.5 MG tablet Take 1 tablet (0.5 mg total) by  mouth at bedtime.  0  . aspirin EC 81 MG tablet Take 81 mg by mouth daily.    Marland Kitchen atorvastatin (LIPITOR) 10 MG tablet Take 10 mg by mouth every morning.    . calcium acetate (PHOSLO) 667 MG capsule Take 1 capsule (667 mg total) by mouth 3 (three) times daily with meals. (Patient taking differently: Take 667 mg by mouth 3 (three) times daily with meals. Per patient taking 2 tablets 3 times daily) 90 capsule 0  . carvedilol (COREG) 25 MG tablet Take 1 tablet (25 mg total) by mouth 2 (two) times daily with a meal. 180 tablet  1  . glimepiride (AMARYL) 4 MG tablet Take 8 mg by mouth daily.  0  . LANTUS SOLOSTAR 100 UNIT/ML Solostar Pen Inject 15 Units into the skin daily at 10 pm.     . latanoprost (XALATAN) 0.005 % ophthalmic solution Place 1 drop into both eyes at bedtime.    Marland Kitchen losartan (COZAAR) 50 MG tablet Take 1 tablet (50 mg total) by mouth at bedtime. 30 tablet 0  . multivitamin (RENA-VIT) TABS tablet Take 1 tablet by mouth daily.  5  . nitroGLYCERIN (NITROSTAT) 0.4 MG SL tablet Place 0.4 mg under the tongue every 5 (five) minutes as needed for chest pain.    . Nutritional Supplements (FEEDING SUPPLEMENT, NEPRO CARB STEADY,) LIQD Take 237 mLs by mouth daily. 30 Can 0  . PROAIR HFA 108 (90 BASE) MCG/ACT inhaler Inhale 2 puffs into the lungs every 4 (four) hours as needed for wheezing or shortness of breath.     . salmeterol (SEREVENT) 50 MCG/DOSE diskus inhaler Inhale 1 puff into the lungs 2 (two) times daily.     No current facility-administered medications for this visit.    ROS:   General:  No weight loss, Fever, chills  HEENT: No recent headaches, no nasal bleeding, no visual changes, no sore throat  Neurologic: No dizziness, blackouts, seizures. No recent symptoms of stroke or mini- stroke. No recent episodes of slurred speech, or temporary blindness.  Cardiac: No recent episodes of chest pain/pressure, no shortness of breath at rest.  + shortness of breath with exertion.  Denies history of atrial fibrillation or irregular heartbeat  Vascular: No history of rest pain in feet.  No history of claudication.  No history of non-healing ulcer, No history of DVT   Pulmonary: No home oxygen, no productive cough, no hemoptysis,  No asthma or wheezing  Musculoskeletal:   Arthritis,  Low back pain,   Joint pain  Hematologic:No history of hypercoagulable state.  No history of easy bleeding.  No history of anemia  Gastrointestinal: No hematochezia or melena,  No gastroesophageal reflux, no trouble  swallowing  Urinary:  chronic Kidney disease, [x ] on HD - [x ] MWF or  TTHS,  Burning with urination,  Frequent urination,  Difficulty urinating;   Skin: No rashes  Psychological: No history of anxiety,  No history of depression   Physical Examination  Filed Vitals:   03/29/15 0857 03/29/15 0859  BP: 147/82 154/84  Pulse: 84 80  Temp: 97.2 F (36.2 C)   Resp: 14   Height: 5' 1.5" (1.562 m)   Weight: 110 lb (49.896 kg)   SpO2: 98%     Body mass index is 20.45 kg/(m^2).  General:  Alert and oriented, no acute distress HEENT: Normal Neck: No bruit or JVD Pulmonary: Clear to auscultation bilaterally Cardiac: Regular Rate and Rhythm  without murmur Gastrointestinal: Soft, non-tender, non-distended, no mass Skin: No rash Extremity Pulses:  2+ radial, brachial pulses bilaterally, no audible flow left brachiocephalic fistula  Musculoskeletal: No deformity or edema  Neurologic: Upper and lower extremity motor 5/5 and symmetric  DATA:  prior vein mapping ultrasound shows cephalic vein on the right upper arm is greater than 3 mm diameter    ASSESSMENT:  patient with failure of left brachiocephalic AV fistula. She needs long-term hemodialysis access.    PLAN: I discussed with the patient today the option of a right brachiocephalic fistula versus a left upper arm AV graft. She would prefer to keep access in her nondominant arm for now. We will plan to place left upper arm AV graft. Risks benefits possible complications and procedure details including but not limited to graft failure thrombosis infection ischemic steal were explained to the patient today. She understands and agrees to proceed. Her procedure is scheduled for 04/04/2015.  She mentioned that she does better with postoperative nausea if she receives Phenergan preprocedure. I told her to discuss this with anesthesia on the day of her procedure.  Fabienne Bruns, MD [facility]  [place] Office: [phone] Pager: [phone]

## 2015-04-03 ENCOUNTER — Encounter (HOSPITAL_COMMUNITY): Payer: Self-pay | Admitting: *Deleted

## 2015-04-03 MED ORDER — SODIUM CHLORIDE 0.9 % IV SOLN
INTRAVENOUS | Status: DC
  Administered 2015-04-04: 13:00:00 via INTRAVENOUS

## 2015-04-03 MED ORDER — DEXTROSE 5 % IV SOLN
1.5000 g | INTRAVENOUS | Status: AC
  Administered 2015-04-04: 1.5 g via INTRAVENOUS
  Filled 2015-04-03: qty 1.5

## 2015-04-03 MED ORDER — CHLORHEXIDINE GLUCONATE CLOTH 2 % EX PADS: 6.0000 | MEDICATED_PAD | Freq: Once | CUTANEOUS | Status: DC

## 2015-04-03 NOTE — Progress Notes (Signed)
Pt denies any acute cardiopulmonary issues but is under the care of Dr. Verdis Prime,  Cardiology. Pt made aware to stop NSAID's otc vitamins, fish oil and herbal medication. Pt verbalized understanding of all pre-op instructions. See note from Rica Mast, NP, anesthesia.

## 2015-04-03 NOTE — Progress Notes (Signed)
Anesthesia Chart Review:  Pt is 64 year old female scheduled for insertion of AV gore-tex graft L arm on 04/04/2015 with Dr. Darrick Penna.   Pt is a same day work up.   Cardiologist is Dr. Verdis Prime, last office visit 02/14/15.   PMH includes:  CAD, HTN, DM, ESRD (on HD), post-op N/V. Former smoker. BMI 20. S/p AV fistula L upper arm 01/31/15.   Hospitalized 9/3-9/21/16 for septic shock presumed secondary to UTI, acute on chronic combined systolic, diastolic CHF, new ESRD, acute hypoxic respiratory failure secondary to toxic encephalopathy and volume overload  Medications include: ASA, lipitor, carvedilol, glimepiride, lantus, losartan, albuterol, salmeterol.   Pt will need labs DOS.   1 view CXR 01/31/15: No acute abnormality noted.   EKG 01/17/15: NSR with sinus arrhythmia. Nonspecific ST and T wave abnormality. Prolonged QT  TEE 01/20/15:  - Aortic valve with no obvious vegetation. Mildly calcified leaflets  Echo 01/18/15:  - Left ventricle: The cavity size was mildly dilated. Systolic function was severely reduced. The estimated ejection fraction was in the range of 25% to 30%. Wall motion was normal; there were no regional wall motion abnormalities. Doppler parameters are consistent with abnormal left ventricular relaxation (grade 1 diastolic dysfunction). Doppler parameters are consistent with elevated ventricular end-diastolic filling pressure. - Aortic valve: There was no regurgitation. - Aortic root: The aortic root was normal in size. - Mitral valve: Calcified annulus. Mildly thickened leaflets . The findings are consistent with mild stenosis. There was mild regurgitation. - Left atrium: The atrium was normal in size. - Right atrium: The atrium was normal in size. - Tricuspid valve: There was trivial regurgitation. - Pulmonic valve: There was trivial regurgitation. - Pulmonary arteries: Systolic pressure was within the normal range. PA peak pressure: 40 mm Hg (S). - Pericardium,  extracardiac: A trivial pericardial effusion was identified. Features were not consistent with tamponade physiology.  Cardiac cath 04/14/14:  1. Severe disease in a branch of the first diagonal. The vessel is relatively small in caliber and is best treated with medical therapy. This is the likely source of the patient's angina. 2. Widely patent LAD stent. Noncritical circumflex (30%) and right coronary disease (50%). 3. Global left ventricular systolic dysfunction with elevated end-diastolic pressure consistent with chronic systolic heart failure.  Pt with severely depressed EF, but tolerated AV fistula creation in September. If labs acceptable DOS, I anticipate pt can proceed as scheduled.   Rica Mast, FNP-BC [facility]/Anesthesiology Phone: [phone] 04/03/2015 3:05 PM

## 2015-04-04 ENCOUNTER — Encounter (HOSPITAL_COMMUNITY)
Admission: RE | Disposition: A | Discharge status: Home / Self Care | Payer: Self-pay | Source: Ambulatory Visit | Attending: Vascular Surgery

## 2015-04-04 ENCOUNTER — Ambulatory Visit (HOSPITAL_COMMUNITY): Payer: Medicare Other | Admitting: Emergency Medicine

## 2015-04-04 ENCOUNTER — Ambulatory Visit (HOSPITAL_COMMUNITY)
Admission: RE | Admit: 2015-04-04 | Discharge: 2015-04-04 | Disposition: A | Discharge status: Home / Self Care | Payer: Medicare Other | Source: Ambulatory Visit | Attending: Vascular Surgery | Admitting: Vascular Surgery

## 2015-04-04 DIAGNOSIS — Z992 Dependence on renal dialysis: Secondary | ICD-10-CM | POA: Diagnosis not present

## 2015-04-04 DIAGNOSIS — N186 End stage renal disease: Secondary | ICD-10-CM | POA: Insufficient documentation

## 2015-04-04 DIAGNOSIS — F419 Anxiety disorder, unspecified: Secondary | ICD-10-CM | POA: Diagnosis not present

## 2015-04-04 DIAGNOSIS — N185 Chronic kidney disease, stage 5: Secondary | ICD-10-CM | POA: Diagnosis not present

## 2015-04-04 DIAGNOSIS — I2510 Atherosclerotic heart disease of native coronary artery without angina pectoris: Secondary | ICD-10-CM | POA: Insufficient documentation

## 2015-04-04 DIAGNOSIS — I132 Hypertensive heart and chronic kidney disease with heart failure and with stage 5 chronic kidney disease, or end stage renal disease: Secondary | ICD-10-CM | POA: Insufficient documentation

## 2015-04-04 DIAGNOSIS — Z79899 Other long term (current) drug therapy: Secondary | ICD-10-CM | POA: Insufficient documentation

## 2015-04-04 DIAGNOSIS — Z7982 Long term (current) use of aspirin: Secondary | ICD-10-CM | POA: Diagnosis not present

## 2015-04-04 DIAGNOSIS — Z87891 Personal history of nicotine dependence: Secondary | ICD-10-CM | POA: Insufficient documentation

## 2015-04-04 DIAGNOSIS — E1122 Type 2 diabetes mellitus with diabetic chronic kidney disease: Secondary | ICD-10-CM | POA: Diagnosis not present

## 2015-04-04 DIAGNOSIS — I5042 Chronic combined systolic (congestive) and diastolic (congestive) heart failure: Secondary | ICD-10-CM | POA: Diagnosis not present

## 2015-04-04 DIAGNOSIS — J449 Chronic obstructive pulmonary disease, unspecified: Secondary | ICD-10-CM | POA: Diagnosis not present

## 2015-04-04 DIAGNOSIS — Z794 Long term (current) use of insulin: Secondary | ICD-10-CM | POA: Diagnosis not present

## 2015-04-04 HISTORY — DX: Heart failure, unspecified: I50.9

## 2015-04-04 HISTORY — DX: Headache, unspecified: R51.9

## 2015-04-04 HISTORY — DX: Headache: R51

## 2015-04-04 HISTORY — DX: Anemia, unspecified: D64.9

## 2015-04-04 HISTORY — PX: AV FISTULA PLACEMENT: SHX1204

## 2015-04-04 LAB — GLUCOSE, CAPILLARY: Glucose-Capillary: 115 mg/dL — ABNORMAL HIGH (ref 65–99)

## 2015-04-04 LAB — POCT I-STAT 4, (NA,K, GLUC, HGB,HCT)
GLUCOSE: 202 mg/dL — AB (ref 65–99)
HEMATOCRIT: 43 % (ref 36.0–46.0)
Hemoglobin: 14.6 g/dL (ref 12.0–15.0)
Potassium: 4.8 mmol/L (ref 3.5–5.1)
SODIUM: 134 mmol/L — AB (ref 135–145)

## 2015-04-04 SURGERY — INSERTION OF ARTERIOVENOUS (AV) GORE-TEX GRAFT ARM
Anesthesia: Monitor Anesthesia Care | Site: Arm Upper | Laterality: Left

## 2015-04-04 MED ORDER — ONDANSETRON HCL 4 MG/2ML IJ SOLN
INTRAMUSCULAR | Status: AC
  Filled 2015-04-04: qty 2

## 2015-04-04 MED ORDER — LIDOCAINE HCL (PF) 1 % IJ SOLN
INTRAMUSCULAR | Status: DC | PRN
  Administered 2015-04-04: 21 mL

## 2015-04-04 MED ORDER — PROMETHAZINE HCL 25 MG/ML IJ SOLN
12.5000 mg | Freq: Once | INTRAMUSCULAR | Status: DC
  Filled 2015-04-04: qty 1

## 2015-04-04 MED ORDER — LIDOCAINE HCL (CARDIAC) 20 MG/ML IV SOLN
INTRAVENOUS | Status: AC
  Filled 2015-04-04: qty 5

## 2015-04-04 MED ORDER — PROTAMINE SULFATE 10 MG/ML IV SOLN
INTRAVENOUS | Status: AC
  Filled 2015-04-04: qty 5

## 2015-04-04 MED ORDER — OXYCODONE HCL 5 MG PO TABS: 5.0000 mg | ORAL_TABLET | Freq: Four times a day (QID) | ORAL | Status: DC | PRN

## 2015-04-04 MED ORDER — THROMBIN 20000 UNITS EX SOLR
CUTANEOUS | Status: AC
  Filled 2015-04-04: qty 20000

## 2015-04-04 MED ORDER — SODIUM CHLORIDE 0.9 % IV SOLN
INTRAVENOUS | Status: DC
  Administered 2015-04-04: 10:00:00 via INTRAVENOUS

## 2015-04-04 MED ORDER — FENTANYL CITRATE (PF) 250 MCG/5ML IJ SOLN
INTRAMUSCULAR | Status: AC
  Filled 2015-04-04: qty 5

## 2015-04-04 MED ORDER — PROPOFOL 500 MG/50ML IV EMUL
INTRAVENOUS | Status: DC | PRN
  Administered 2015-04-04: 50 ug/kg/min via INTRAVENOUS

## 2015-04-04 MED ORDER — PROMETHAZINE HCL 25 MG/ML IJ SOLN
INTRAMUSCULAR | Status: DC | PRN
  Administered 2015-04-04: 12.5 mg via INTRAVENOUS

## 2015-04-04 MED ORDER — ONDANSETRON HCL 4 MG/2ML IJ SOLN
INTRAMUSCULAR | Status: DC | PRN
  Administered 2015-04-04: 4 mg via INTRAVENOUS

## 2015-04-04 MED ORDER — LIDOCAINE HCL (CARDIAC) 20 MG/ML IV SOLN
INTRAVENOUS | Status: DC | PRN
  Administered 2015-04-04: 50 mg via INTRAVENOUS

## 2015-04-04 MED ORDER — PROPOFOL 10 MG/ML IV BOLUS
INTRAVENOUS | Status: DC | PRN
  Administered 2015-04-04: 10 mg via INTRAVENOUS

## 2015-04-04 MED ORDER — PROTAMINE SULFATE 10 MG/ML IV SOLN
INTRAVENOUS | Status: DC | PRN
  Administered 2015-04-04 (×5): 10 mg via INTRAVENOUS

## 2015-04-04 MED ORDER — FENTANYL CITRATE (PF) 100 MCG/2ML IJ SOLN
INTRAMUSCULAR | Status: DC | PRN
  Administered 2015-04-04 (×2): 25 ug via INTRAVENOUS
  Administered 2015-04-04: 50 ug via INTRAVENOUS

## 2015-04-04 MED ORDER — HEPARIN SODIUM (PORCINE) 1000 UNIT/ML IJ SOLN
INTRAMUSCULAR | Status: DC | PRN
  Administered 2015-04-04: 5 mL via INTRAVENOUS

## 2015-04-04 MED ORDER — HEPARIN SODIUM (PORCINE) 1000 UNIT/ML IJ SOLN
INTRAMUSCULAR | Status: AC
  Filled 2015-04-04: qty 1

## 2015-04-04 MED ORDER — LIDOCAINE HCL (PF) 1 % IJ SOLN
INTRAMUSCULAR | Status: AC
  Filled 2015-04-04: qty 30

## 2015-04-04 MED ORDER — ONDANSETRON HCL 4 MG/2ML IJ SOLN: 4.0000 mg | Freq: Once | INTRAMUSCULAR | Status: DC | PRN

## 2015-04-04 MED ORDER — PROPOFOL 10 MG/ML IV BOLUS
INTRAVENOUS | Status: AC
  Filled 2015-04-04: qty 20

## 2015-04-04 MED ORDER — SODIUM CHLORIDE 0.9 % IV SOLN
INTRAVENOUS | Status: DC | PRN
  Administered 2015-04-04: 500 mL

## 2015-04-04 MED ORDER — PHENYLEPHRINE HCL 10 MG/ML IJ SOLN
INTRAMUSCULAR | Status: DC | PRN
  Administered 2015-04-04: 80 ug via INTRAVENOUS
  Administered 2015-04-04: 120 ug via INTRAVENOUS

## 2015-04-04 MED ORDER — FENTANYL CITRATE (PF) 100 MCG/2ML IJ SOLN: 25.0000 ug | INTRAMUSCULAR | Status: DC | PRN

## 2015-04-04 MED ORDER — 0.9 % SODIUM CHLORIDE (POUR BTL) OPTIME
TOPICAL | Status: DC | PRN
  Administered 2015-04-04: 1000 mL

## 2015-04-04 SURGICAL SUPPLY — 34 items
ARMBAND PINK RESTRICT EXTREMIT (MISCELLANEOUS) ×2 IMPLANT
CANISTER SUCTION 2500CC (MISCELLANEOUS) ×2 IMPLANT
CANNULA VESSEL 3MM 2 BLNT TIP (CANNULA) ×2 IMPLANT
CLIP TI MEDIUM 6 (CLIP) ×2 IMPLANT
CLIP TI WIDE RED SMALL 6 (CLIP) ×2 IMPLANT
DECANTER SPIKE VIAL GLASS SM (MISCELLANEOUS) IMPLANT
ELECT REM PT RETURN 9FT ADLT (ELECTROSURGICAL) ×2
ELECTRODE REM PT RTRN 9FT ADLT (ELECTROSURGICAL) ×1 IMPLANT
GEL ULTRASOUND 20GR AQUASONIC (MISCELLANEOUS) IMPLANT
GLOVE BIO SURGEON STRL SZ 6.5 (GLOVE) ×6 IMPLANT
GLOVE BIO SURGEON STRL SZ7.5 (GLOVE) ×2 IMPLANT
GLOVE BIOGEL PI IND STRL 6.5 (GLOVE) ×3 IMPLANT
GLOVE BIOGEL PI INDICATOR 6.5 (GLOVE) ×3
GLOVE ECLIPSE 6.5 STRL STRAW (GLOVE) ×2 IMPLANT
GOWN STRL REUS W/ TWL LRG LVL3 (GOWN DISPOSABLE) ×3 IMPLANT
GOWN STRL REUS W/ TWL XL LVL3 (GOWN DISPOSABLE) ×1 IMPLANT
GOWN STRL REUS W/TWL LRG LVL3 (GOWN DISPOSABLE) ×3
GOWN STRL REUS W/TWL XL LVL3 (GOWN DISPOSABLE) ×1
GRAFT GORETEX STRT 4-7X45 (Vascular Products) ×2 IMPLANT
KIT BASIN OR (CUSTOM PROCEDURE TRAY) ×2 IMPLANT
KIT ROOM TURNOVER OR (KITS) ×2 IMPLANT
LIQUID BAND (GAUZE/BANDAGES/DRESSINGS) ×2 IMPLANT
LOOP VESSEL MINI RED (MISCELLANEOUS) IMPLANT
NS IRRIG 1000ML POUR BTL (IV SOLUTION) ×2 IMPLANT
PACK CV ACCESS (CUSTOM PROCEDURE TRAY) ×2 IMPLANT
PAD ARMBOARD 7.5X6 YLW CONV (MISCELLANEOUS) ×4 IMPLANT
SPONGE SURGIFOAM ABS GEL 100 (HEMOSTASIS) IMPLANT
SUT PROLENE 6 0 CC (SUTURE) ×4 IMPLANT
SUT PROLENE 7 0 BV 1 (SUTURE) ×2 IMPLANT
SUT VIC AB 3-0 SH 27 (SUTURE) ×2
SUT VIC AB 3-0 SH 27X BRD (SUTURE) ×2 IMPLANT
SUT VICRYL 4-0 PS2 18IN ABS (SUTURE) ×4 IMPLANT
UNDERPAD 30X30 INCONTINENT (UNDERPADS AND DIAPERS) ×2 IMPLANT
WATER STERILE IRR 1000ML POUR (IV SOLUTION) ×2 IMPLANT

## 2015-04-04 NOTE — Discharge Instructions (Signed)
° ° °  04/04/2015 Pamela [name] [id number] May 27, 1950  Surgeon(s): Sherren Kerns, MD  Procedure(s): INSERTION OF ARTERIOVENOUS (AV) GORE-TEX GRAFT LEFT ARM  X Do not stick graft for 4 weeks

## 2015-04-04 NOTE — Op Note (Signed)
Procedure: Left Upper Arm AV graft  Preop: ESRD  Postop: ESRD  Anesthesia: Local with IV sedation  Findings:4-7 mm PTFE end to side to axillary vein   Procedure Details: The left upper extremity was prepped and draped in usual sterile fashion.  A longitudinal incision was then made near the antecubital crease the left arm.  There were no suitable antecubital veins for outflow.   The incision was carried into the subcutaneous tissues down to level of the brachial artery.  Next the brachial artery was dissected free in the medial portion incision. The artery was  2-3 mm in diameter. The vessel loops were placed proximal and distal to the planned site of arteriotomy. At this point, a longitudinal incision was made in the axilla and carried through the subcutaneous tissues and fascia to expose the axillary vein.  The nerves were protected.  The vein was approximately 3-4 mm in diameter. Next, a subcutaneous tunnel was created connecting the upper arm to the lower arm incision in an arcing configuration over the biceps muscle.  A 4-7 mm PTFE graft was then brought through this subcutaneous tunnel. The patient was given 5000 units of intravenous heparin. After appropriate circulation time, the vessel loops were used to control the artery. A longitudinal opening was made in the right brachial artery.  The 4 mm end of the graft was beveled and sewn end to side to the artery using a 6 0 prolene.  At completion of the anastomosis the artery was forward bled, backbled and thoroughly flushed.  The anastomosis was secured, vessel loops were released and there was palpable pulse in the graft.  The graft was clamped just above the arterial anastomosis with a fistula clamp. The graft was then pulled taut to length at the axillary incision.  The axillary vein was controlled with a fine bulldog clamp in the upper axilla.  The vein was opened longitudinally.  The distal end of the graft was then beveled and sewn end to  side to the vein using a running 6 0 prolene.  Just prior to completion of the anastomosis, everything was forward bled, back bled and thoroughly flushed.  The anastomosis was secured and the fistula clamp removed from the proximal graft.  A thrill was immediately palpable in the graft. The patient was given 50 mg of protamine to assist with hemostasis.  After hemostasis was obtained, the subcutaneous tissues were reapproximated using a running 3-0 Vicryl suture. The skin was then closed with a 4 0 Vicryl subcuticular stitch. Dermabond was applied to the skin incisions.  The patient tolerated the procedure well and there were no complications.  Instrument sponge and needle count was correct at the end of the case.  The patient was taken to the recovery room in stable condition. The patient had audible ulnar doppler signals at the end of the case with compression of the graft.  She will need to be watched closely for steal.  Fabienne Bruns, MD [facility] [place] Office: [phone] Pager: [phone]

## 2015-04-04 NOTE — Anesthesia Postprocedure Evaluation (Signed)
Anesthesia Post Note  Patient: Pamela [name]  Procedure(s) Performed: Procedure(s) (LRB): INSERTION OF ARTERIOVENOUS (AV) GORE-TEX GRAFT ARM (Left)  Patient location during evaluation: PACU Anesthesia Type: MAC Level of consciousness: awake and alert Pain management: pain level controlled Vital Signs Assessment: post-procedure vital signs reviewed and stable Respiratory status: spontaneous breathing, nonlabored ventilation, respiratory function stable and patient connected to nasal cannula oxygen Cardiovascular status: stable and blood pressure returned to baseline Anesthetic complications: no    Last Vitals:  Filed Vitals:   04/04/15 1510 04/04/15 1525  BP: 109/65 105/51  Pulse: 80 76  Temp: 36.5 C   Resp: 15 17    Last Pain: There were no vitals filed for this visit.               Reino Kent

## 2015-04-04 NOTE — Anesthesia Postprocedure Evaluation (Signed)
Anesthesia Post Note  Patient: Pamela [name]  Procedure(s) Performed: Procedure(s) (LRB): INSERTION OF ARTERIOVENOUS (AV) GORE-TEX GRAFT ARM (Left)  Anesthesia Post Evaluation  Last Vitals:  Filed Vitals:   04/04/15 0943  BP: 162/72  Pulse: 80  Temp: 36.9 C  Resp: 16    Last Pain: There were no vitals filed for this visit.               Anitra Doxtater

## 2015-04-04 NOTE — Anesthesia Procedure Notes (Signed)
Procedure Name: MAC Date/Time: 04/04/2015 1:05 PM Performed by: Gwenyth Allegra Pre-anesthesia Checklist: Patient identified Patient Re-evaluated:Patient Re-evaluated prior to inductionOxygen Delivery Method: Nasal cannula Preoxygenation: Pre-oxygenation with 100% oxygen Intubation Type: IV induction

## 2015-04-04 NOTE — Interval H&P Note (Signed)
History and Physical Interval Note:  04/04/2015 12:47 PM  Pamela [name]  has presented today for surgery, with the diagnosis of End Stage Renal Disease N18.6  The various methods of treatment have been discussed with the patient and family. After consideration of risks, benefits and other options for treatment, the patient has consented to  Procedure(s): INSERTION OF ARTERIOVENOUS (AV) GORE-TEX GRAFT ARM (Left) as a surgical intervention .  The patient's history has been reviewed, patient examined, no change in status, stable for surgery.  I have reviewed the patient's chart and labs.  Questions were answered to the patient's satisfaction.     Fabienne Bruns

## 2015-04-04 NOTE — Progress Notes (Signed)
  Day of Surgery Note    Subjective:  No complaints of pain in her hand  Filed Vitals:   04/04/15 1510 04/04/15 1525  BP: 109/65 105/51  Pulse: 80 76  Temp: 97.7 F (36.5 C)   Resp: 15 17    Incisions:   Looks good Extremities:  Left hand with motor and sensation in tact.  +monophasic doppler signal left radial; +biphasic doppler signal left ulnar; +thrill in graft Cardiac:  regular Lungs:  Non labored   Assessment/Plan:  This is a 64 y.o. female who is s/p placement new LUA AVG  -pt seen in pacu and she is doing well.   -she does not have any evidence of steal sx and denies any pain. -will f/u with Dr. Darrick Penna in 2 weeks.  She knows to call sooner if she develops pain or numbness of her left hand.   Doreatha Massed, PA-C 04/04/2015 4:13 PM

## 2015-04-04 NOTE — H&P (View-Only) (Signed)
[facility] [place] HISTORY AND PHYSICAL   History of Present Illness:  Patient is a 64 y.o. year old female who presents for placement of a permanent hemodialysis access. She has previously had a left brachiocephalic fistula which failed before using. She did have some mild tingling in her fingertips after placement of the fistula. She currently is dialyzing via a right-sided catheter on Monday Wednesday and Friday. This catheter has been in place since September when she started dialysis. The patient is right handed .  The patient is currently on hemodialysis Monday Wednesday and Friday.  Other chronic medical problems include hypertension, COPD, diabetes, coronary disease all of which are currently stable..  Past Medical History  Diagnosis Date  . PONV (postoperative nausea and vomiting)   . Hypertension   . COPD (chronic obstructive pulmonary disease) (HCC)   . Diabetes mellitus without complication (HCC) 10-29-12  . Coronary artery disease   . Shortness of breath dyspnea   . Anxiety   . Chronic kidney disease     Past Surgical History  Procedure Laterality Date  . Abdominal hysterectomy    . Elbow surgery Right     tendon surgery  . Tubal ligation    . Breast surgery      cyst removed  . Cholecystectomy      '74-open  . Appendectomy      '74- open with gallbladder  . Cardiac catheterization      1 coronary stent placed  . Ganglion cyst excision Bilateral 10-29-12    wrist  . Anal fissure repair    . Colonoscopy with propofol N/A 11/17/2012    Procedure: COLONOSCOPY WITH PROPOFOL;  Surgeon: Martin K Johnson, MD;  Location: [facility];  Service: Endoscopy;  Laterality: N/A;  . Esophagogastroduodenoscopy (egd) with propofol N/A 11/17/2012    Procedure: ESOPHAGOGASTRODUODENOSCOPY (EGD) WITH PROPOFOL;  Surgeon: Martin K Johnson, MD;  Location: [facility];  Service: Endoscopy;  Laterality: N/A;  . Left heart catheterization with coronary angiogram N/A  04/14/2014    Procedure: LEFT HEART CATHETERIZATION WITH CORONARY ANGIOGRAM;  Surgeon: Henry W Smith III, MD;  Location: [facility] CATH [facility];  Service: Cardiovascular;  Laterality: N/A;  . Tee without cardioversion N/A 01/20/2015    Procedure: TRANSESOPHAGEAL ECHOCARDIOGRAM (TEE);  Surgeon: Paula Ross V, MD;  Location: [facility];  Service: Cardiovascular;  Laterality: N/A;  . Av fistula placement Left 01/31/2015    Procedure: ARTERIOVENOUS FISTULA CREATION-LEFT BRACHIO-CEPHALIC ;  Surgeon: Brian L Chen, MD;  Location: [facility] OR;  Service: Vascular;  Laterality: Left;  . Peripheral vascular catheterization N/A 03/09/2015    Procedure: Fistulagram;  Surgeon: Brian L Chen, MD;  [facility]: [facility] INVASIVE CV LAB;  Service: Cardiovascular;  Laterality: N/A;     Social History Social History  Substance Use Topics  . Smoking status: Former Smoker -- 1.50 packs/day    Types: Cigarettes    Quit date: 10/29/1992  . Smokeless tobacco: Never Used  . Alcohol Use: No    Family History Family History  Problem Relation Age of Onset  . Diabetes Father   . Cancer Father   . Hypertension Father   . Heart attack Mother   . Hypertension Mother   . Diabetes Sister   . Hypertension Sister   . Cancer Sister   . Hypertension Sister     Allergies  No Known Allergies   Current Outpatient Prescriptions  Medication Sig Dispense Refill  . ALPRAZolam (XANAX) 0.5 MG tablet Take 1 tablet (0.5 mg total) by   mouth at bedtime.  0  . aspirin EC 81 MG tablet Take 81 mg by mouth daily.    Marland Kitchen atorvastatin (LIPITOR) 10 MG tablet Take 10 mg by mouth every morning.    . calcium acetate (PHOSLO) 667 MG capsule Take 1 capsule (667 mg total) by mouth 3 (three) times daily with meals. (Patient taking differently: Take 667 mg by mouth 3 (three) times daily with meals. Per patient taking 2 tablets 3 times daily) 90 capsule 0  . carvedilol (COREG) 25 MG tablet Take 1 tablet (25 mg total) by mouth 2 (two) times daily with a meal. 180 tablet  1  . glimepiride (AMARYL) 4 MG tablet Take 8 mg by mouth daily.  0  . LANTUS SOLOSTAR 100 UNIT/ML Solostar Pen Inject 15 Units into the skin daily at 10 pm.     . latanoprost (XALATAN) 0.005 % ophthalmic solution Place 1 drop into both eyes at bedtime.    Marland Kitchen losartan (COZAAR) 50 MG tablet Take 1 tablet (50 mg total) by mouth at bedtime. 30 tablet 0  . multivitamin (RENA-VIT) TABS tablet Take 1 tablet by mouth daily.  5  . nitroGLYCERIN (NITROSTAT) 0.4 MG SL tablet Place 0.4 mg under the tongue every 5 (five) minutes as needed for chest pain.    . Nutritional Supplements (FEEDING SUPPLEMENT, NEPRO CARB STEADY,) LIQD Take 237 mLs by mouth daily. 30 Can 0  . PROAIR HFA 108 (90 BASE) MCG/ACT inhaler Inhale 2 puffs into the lungs every 4 (four) hours as needed for wheezing or shortness of breath.     . salmeterol (SEREVENT) 50 MCG/DOSE diskus inhaler Inhale 1 puff into the lungs 2 (two) times daily.     No current facility-administered medications for this visit.    ROS:   General:  No weight loss, Fever, chills  HEENT: No recent headaches, no nasal bleeding, no visual changes, no sore throat  Neurologic: No dizziness, blackouts, seizures. No recent symptoms of stroke or mini- stroke. No recent episodes of slurred speech, or temporary blindness.  Cardiac: No recent episodes of chest pain/pressure, no shortness of breath at rest.  + shortness of breath with exertion.  Denies history of atrial fibrillation or irregular heartbeat  Vascular: No history of rest pain in feet.  No history of claudication.  No history of non-healing ulcer, No history of DVT   Pulmonary: No home oxygen, no productive cough, no hemoptysis,  No asthma or wheezing  Musculoskeletal:   Arthritis,  Low back pain,   Joint pain  Hematologic:No history of hypercoagulable state.  No history of easy bleeding.  No history of anemia  Gastrointestinal: No hematochezia or melena,  No gastroesophageal reflux, no trouble  swallowing  Urinary:  chronic Kidney disease, [x ] on HD - [x ] MWF or  TTHS,  Burning with urination,  Frequent urination,  Difficulty urinating;   Skin: No rashes  Psychological: No history of anxiety,  No history of depression   Physical Examination  Filed Vitals:   03/29/15 0857 03/29/15 0859  BP: 147/82 154/84  Pulse: 84 80  Temp: 97.2 F (36.2 C)   Resp: 14   Height: 5' 1.5" (1.562 m)   Weight: 110 lb (49.896 kg)   SpO2: 98%     Body mass index is 20.45 kg/(m^2).  General:  Alert and oriented, no acute distress HEENT: Normal Neck: No bruit or JVD Pulmonary: Clear to auscultation bilaterally Cardiac: Regular Rate and Rhythm  without murmur Gastrointestinal: Soft, non-tender, non-distended, no mass Skin: No rash Extremity Pulses:  2+ radial, brachial pulses bilaterally, no audible flow left brachiocephalic fistula  Musculoskeletal: No deformity or edema  Neurologic: Upper and lower extremity motor 5/5 and symmetric  DATA:  prior vein mapping ultrasound shows cephalic vein on the right upper arm is greater than 3 mm diameter    ASSESSMENT:  patient with failure of left brachiocephalic AV fistula. She needs long-term hemodialysis access.    PLAN: I discussed with the patient today the option of a right brachiocephalic fistula versus a left upper arm AV graft. She would prefer to keep access in her nondominant arm for now. We will plan to place left upper arm AV graft. Risks benefits possible complications and procedure details including but not limited to graft failure thrombosis infection ischemic steal were explained to the patient today. She understands and agrees to proceed. Her procedure is scheduled for 04/04/2015.  She mentioned that she does better with postoperative nausea if she receives Phenergan preprocedure. I told her to discuss this with anesthesia on the day of her procedure.  Fabienne Bruns, MD [facility]  [place] Office: [phone] Pager: [phone]

## 2015-04-04 NOTE — Anesthesia Preprocedure Evaluation (Addendum)
Anesthesia Evaluation  Patient identified by MRN, date of birth, ID band Patient awake    Reviewed: Allergy & Precautions, H&P , NPO status , Patient's Chart, lab work & pertinent test results  History of Anesthesia Complications (+) PONV and history of anesthetic complications  Airway Mallampati: II  TM Distance: >3 FB Neck ROM: full    Dental no notable dental hx.    Pulmonary COPD, former smoker,    Pulmonary exam normal breath sounds clear to auscultation       Cardiovascular hypertension, + CAD and +CHF  Normal cardiovascular exam Rhythm:regular Rate:Normal  EF is 25-30%, no sig valvular lesions   Neuro/Psych negative neurological ROS     GI/Hepatic negative GI ROS, Neg liver ROS,   Endo/Other  negative endocrine ROSdiabetes  Renal/GU negative Renal ROS     Musculoskeletal   Abdominal   Peds  Hematology negative hematology ROS (+)   Anesthesia Other Findings   Reproductive/Obstetrics negative OB ROS                            Anesthesia Physical Anesthesia Plan  ASA: III  Anesthesia Plan: MAC   Post-op Pain Management:    Induction: Intravenous  Airway Management Planned: Natural Airway  Additional Equipment:   Intra-op Plan:   Post-operative Plan: Extubation in OR  Informed Consent: I have reviewed the patients History and Physical, chart, labs and discussed the procedure including the risks, benefits and alternatives for the proposed anesthesia with the patient or authorized representative who has indicated his/her understanding and acceptance.   Dental Advisory Given  Plan Discussed with: Anesthesiologist, CRNA and Surgeon  Anesthesia Plan Comments: (Plan for propofol infusion with MAC, will place LMA if procedure too stimulating Will give phenergan 12.5mg  preop as patient asks for it, has tolerated well in past and has bad PONV with pain meds an GA Denies  cardiac or pulmonary symptoms currently, no signs of heart failure, no angina, dialysis yesterday)       Anesthesia Quick Evaluation

## 2015-04-04 NOTE — Transfer of Care (Signed)
Immediate Anesthesia Transfer of Care Note  Patient: Pamela [name]  Procedure(s) Performed: Procedure(s): INSERTION OF ARTERIOVENOUS (AV) GORE-TEX GRAFT ARM (Left)  Patient Location: PACU  Anesthesia Type:MAC  Level of Consciousness: awake, alert  and oriented  Airway & Oxygen Therapy: Patient Spontanous Breathing and Patient connected to nasal cannula oxygen  Post-op Assessment: Report given to RN and Post -op Vital signs reviewed and stable  Post vital signs: Reviewed and stable  Last Vitals:  Filed Vitals:   04/04/15 0943  BP: 162/72  Pulse: 80  Temp: 36.9 C  Resp: 16    Complications: No apparent anesthesia complications

## 2015-04-05 ENCOUNTER — Telehealth: Payer: Self-pay | Admitting: Vascular Surgery

## 2015-04-05 NOTE — Telephone Encounter (Signed)
-----   Message from Phillips Odor, RN sent at 04/04/2015  4:26 PM EST ----- Regarding: Joyce Gross log; also needs 2 week f/u with Dr. Darrick Penna   ----- Message -----    From: Dara Lords, PA-C    Sent: 04/04/2015   2:56 PM      To: Vvs Charge Pool  S/p LUA AVG 04/04/15.  Dr. Darrick Penna wants to see her back in 2 weeks.  Thanks, Lelon Mast

## 2015-04-05 NOTE — Telephone Encounter (Signed)
LM for pt re appt, dpm °

## 2015-04-10 ENCOUNTER — Encounter (HOSPITAL_COMMUNITY): Payer: Self-pay | Admitting: Vascular Surgery

## 2015-04-12 ENCOUNTER — Encounter: Payer: Medicare Other | Admitting: Vascular Surgery

## 2015-04-14 ENCOUNTER — Encounter: Payer: Self-pay | Admitting: Vascular Surgery

## 2015-04-19 ENCOUNTER — Encounter: Payer: Self-pay | Admitting: Vascular Surgery

## 2015-04-19 ENCOUNTER — Ambulatory Visit (INDEPENDENT_AMBULATORY_CARE_PROVIDER_SITE_OTHER): Payer: Self-pay | Admitting: Vascular Surgery

## 2015-04-19 ENCOUNTER — Encounter: Payer: Medicare Other | Admitting: Vascular Surgery

## 2015-04-19 VITALS — BP 159/69 | HR 75 | Temp 97.0°F | Resp 16 | Ht 61.5 in | Wt 109.0 lb

## 2015-04-19 DIAGNOSIS — N186 End stage renal disease: Secondary | ICD-10-CM

## 2015-04-19 DIAGNOSIS — Z992 Dependence on renal dialysis: Secondary | ICD-10-CM

## 2015-04-19 NOTE — Progress Notes (Signed)
Patient is 64 year old female who is 2 weeks status post placement of a left upper arm AV graft. She denies any numbness or tingling in her hand. She currently is dialyzing via a right-sided catheter.  Physical exam:  Filed Vitals:   04/19/15 0903 04/19/15 0905  BP: 149/69 159/69  Pulse: 75 75  Temp: 97 F (36.1 C)   Resp: 16   Height: 5' 1.5" (1.562 m)   Weight: 109 lb (49.442 kg)   SpO2: 98%     Left upper extremity: Audible bruit palpable thrill in graft incisions both well-healed no palpable radial pulse but hand well-perfused  Assessment: No evidence of steal graft should be ready for use in 2 weeks follow-up when necessary  Fabienne Bruns, MD [facility] [place] Office: [phone] Pager: [phone]

## 2015-04-19 NOTE — Progress Notes (Signed)
Filed Vitals:   04/19/15 0903 04/19/15 0905  BP: 149/69 159/69  Pulse: 75 75  Temp: 97 F (36.1 C)   Resp: 16   Height: 5' 1.5" (1.562 m)   Weight: 109 lb (49.442 kg)   SpO2: 98%

## 2015-04-21 ENCOUNTER — Encounter: Payer: Medicare Other | Admitting: Vascular Surgery

## 2015-04-25 ENCOUNTER — Ambulatory Visit (HOSPITAL_COMMUNITY): Payer: Medicare Other | Attending: Cardiology

## 2015-04-25 ENCOUNTER — Other Ambulatory Visit: Payer: Self-pay

## 2015-04-25 DIAGNOSIS — N186 End stage renal disease: Secondary | ICD-10-CM | POA: Insufficient documentation

## 2015-04-25 DIAGNOSIS — I5022 Chronic systolic (congestive) heart failure: Secondary | ICD-10-CM | POA: Diagnosis not present

## 2015-04-25 DIAGNOSIS — I120 Hypertensive chronic kidney disease with stage 5 chronic kidney disease or end stage renal disease: Secondary | ICD-10-CM | POA: Insufficient documentation

## 2015-04-25 DIAGNOSIS — I340 Nonrheumatic mitral (valve) insufficiency: Secondary | ICD-10-CM | POA: Diagnosis not present

## 2015-04-25 DIAGNOSIS — Z992 Dependence on renal dialysis: Secondary | ICD-10-CM | POA: Diagnosis not present

## 2015-04-25 DIAGNOSIS — E119 Type 2 diabetes mellitus without complications: Secondary | ICD-10-CM | POA: Diagnosis not present

## 2015-04-25 DIAGNOSIS — I313 Pericardial effusion (noninflammatory): Secondary | ICD-10-CM | POA: Insufficient documentation

## 2015-04-26 ENCOUNTER — Ambulatory Visit: Payer: Medicare Other | Admitting: Interventional Cardiology

## 2015-05-01 NOTE — Progress Notes (Signed)
Cardiology Office Note   Date:  05/01/2015   ID:  Pamela [name], DOB Jun 09, 1950, MRN [id number]  PCP:  Georgann Housekeeper, MD  Cardiologist:  Lesleigh Noe, MD   Chief Complaint  Patient presents with  . Congestive Heart Failure      History of Present Illness: Pamela [name] is a 64 y.o. female who presents for f/u of CAD, systolic heart failure, COPD, CKD, and hypertension.   Pamela [name] is doing okay. She still has exertional dyspnea but this is improving. Medical therapy for heart failure has been decreased related to   hypotension to dialysis. Her appetite is improving. She denies angina. There is no peripheral edema. Appetite is improving.    Past Medical History  Diagnosis Date  . PONV (postoperative nausea and vomiting)   . Hypertension   . COPD (chronic obstructive pulmonary disease) (HCC)   . Diabetes mellitus without complication (HCC) 10-29-12  . Coronary artery disease   . Shortness of breath dyspnea   . Anxiety   . Chronic kidney disease   . Headache   . CHF (congestive heart failure) (HCC)   . Anemia     Past Surgical History  Procedure Laterality Date  . Abdominal hysterectomy    . Elbow surgery Right     tendon surgery  . Tubal ligation    . Breast surgery      cyst removed  . Cholecystectomy      '74-open  . Appendectomy      '74- open with gallbladder  . Cardiac catheterization      1 coronary stent placed  . Ganglion cyst excision Bilateral 10-29-12    wrist  . Anal fissure repair    . Colonoscopy with propofol N/A 11/17/2012    Procedure: COLONOSCOPY WITH PROPOFOL;  Surgeon: Charolett Bumpers, MD;  Location: [facility];  Service: Endoscopy;  Laterality: N/A;  . Esophagogastroduodenoscopy (egd) with propofol N/A 11/17/2012    Procedure: ESOPHAGOGASTRODUODENOSCOPY (EGD) WITH PROPOFOL;  Surgeon: Charolett Bumpers, MD;  Location: [facility];  Service: Endoscopy;  Laterality: N/A;  . Left heart catheterization with coronary angiogram N/A  04/14/2014    Procedure: LEFT HEART CATHETERIZATION WITH CORONARY ANGIOGRAM;  Surgeon: Lesleigh Noe, MD;  Location: [facility] CATH LAB;  Service: Cardiovascular;  Laterality: N/A;  . Tee without cardioversion N/A 01/20/2015    Procedure: TRANSESOPHAGEAL ECHOCARDIOGRAM (TEE);  Surgeon: Pricilla Riffle, MD;  Location: [facility] ENDOSCOPY;  Service: Cardiovascular;  Laterality: N/A;  . Av fistula placement Left 01/31/2015    Procedure: ARTERIOVENOUS FISTULA CREATION-LEFT BRACHIO-CEPHALIC ;  Surgeon: Fransisco Hertz, MD;  Location: [facility] OR;  Service: Vascular;  Laterality: Left;  . Peripheral vascular catheterization N/A 03/09/2015    Procedure: Fistulagram;  Surgeon: Fransisco Hertz, MD;  Location: [facility] INVASIVE CV LAB;  Service: Cardiovascular;  Laterality: N/A;  . Coronary stent placement    . Av fistula placement Left 04/04/2015    Procedure: INSERTION OF ARTERIOVENOUS (AV) GORE-TEX GRAFT ARM;  Surgeon: Sherren Kerns, MD;  Location: [facility] OR;  Service: Vascular;  Laterality: Left;     Current Outpatient Prescriptions  Medication Sig Dispense Refill  . ALPRAZolam (XANAX) 0.5 MG tablet Take 1 tablet (0.5 mg total) by mouth at bedtime.  0  . aspirin EC 81 MG tablet Take 81 mg by mouth daily.    Marland Kitchen atorvastatin (LIPITOR) 10 MG tablet Take 10 mg by mouth every morning.    . calcium acetate (PHOSLO) 667 MG capsule Take 1  capsule (667 mg total) by mouth 3 (three) times daily with meals. (Patient taking differently: Take 667 mg by mouth 3 (three) times daily with meals. Per patient taking 2 tablets 3 times daily) 90 capsule 0  . carvedilol (COREG) 25 MG tablet Take 1 tablet (25 mg total) by mouth 2 (two) times daily with a meal. 180 tablet 1  . glimepiride (AMARYL) 4 MG tablet Take 8 mg by mouth daily.  0  . LANTUS SOLOSTAR 100 UNIT/ML Solostar Pen Inject 15 Units into the skin daily at 10 pm.     . latanoprost (XALATAN) 0.005 % ophthalmic solution Place 1 drop into both eyes at bedtime.    Marland Kitchen losartan (COZAAR) 50 MG tablet  Take 1 tablet (50 mg total) by mouth at bedtime. 30 tablet 0  . multivitamin (RENA-VIT) TABS tablet Take 1 tablet by mouth daily.  5  . nitroGLYCERIN (NITROSTAT) 0.4 MG SL tablet Place 0.4 mg under the tongue every 5 (five) minutes as needed for chest pain.    . Nutritional Supplements (FEEDING SUPPLEMENT, NEPRO CARB STEADY,) LIQD Take 237 mLs by mouth daily. 30 Can 0  . oxyCODONE (ROXICODONE) 5 MG immediate release tablet Take 1 tablet (5 mg total) by mouth every 6 (six) hours as needed. (Patient not taking: Reported on 04/19/2015) 20 tablet 0  . PROAIR HFA 108 (90 BASE) MCG/ACT inhaler Inhale 2 puffs into the lungs every 4 (four) hours as needed for wheezing or shortness of breath.     . salmeterol (SEREVENT) 50 MCG/DOSE diskus inhaler Inhale 1 puff into the lungs 2 (two) times daily.     No current facility-administered medications for this visit.    Allergies:   Review of patient's allergies indicates no known allergies.    Social History:  The patient  reports that she quit smoking about 22 years ago. Her smoking use included Cigarettes. She smoked 1.50 packs per day. She has never used smokeless tobacco. She reports that she does not drink alcohol or use illicit drugs.   Family History:  The patient's family history includes Cancer in her father and sister; Diabetes in her father and sister; Heart attack in her mother; Hypertension in her father, mother, sister, and sister.    ROS:  Please see the history of present illness.   Otherwise, review of systems are positive for  Muscle aches and pains.   All other systems are reviewed and negative.    PHYSICAL EXAM: VS:  There were no vitals taken for this visit. , BMI There is no weight on file to calculate BMI. GEN: Well nourished, well developed, in no acute distress HEENT: normal Neck: no JVD, carotid bruits, or masses Cardiac: RRR.  There is  A 2 of 6 blowing apical systolic murmur of mitral regurgitation. There is no rub, or gallop.  There is  no edema. Respiratory:  clear to auscultation bilaterally, normal work of breathing. GI: soft, nontender, nondistended, + BS MS: no deformity or atrophy Skin: warm and dry, no rash Neuro:  Strength and sensation are intact Psych: euthymic mood, full affect   EKG:  EKG is not ordered today.    Recent Labs: 01/14/2015: B Natriuretic Peptide 2160.6* 01/17/2015: TSH 1.486 01/18/2015: ALT 19 02/01/2015: Magnesium 2.0; Platelets 210 03/09/2015: BUN 47*; Creatinine, Ser 3.00* 04/04/2015: Hemoglobin 14.6; Potassium 4.8; Sodium 134*    Lipid Panel    Component Value Date/Time   TRIG 102 01/20/2015 0908      Wt Readings from Last 3 Encounters:  04/19/15 109 lb (49.442 kg)  04/04/15 110 lb (49.896 kg)  03/29/15 110 lb (49.896 kg)      Other studies Reviewed: Additional studies/ records that were reviewed today include:   Echocardiographic findings.. The findings include  Improvement in LV function with EF to 35-40%. Mild mitral regurgitation is noted..    ASSESSMENT AND PLAN:  1. Coronary artery disease involving native coronary artery of native heart with unstable angina pectoris (HCC)  asymptomatic  2. Chronic systolic heart failure (HCC)  improvement in systolic function with EF now in the 35-40% range  3. Essential hypertension  anything she has had some hypotension since starting dialysis.  4. COPD, severe (HCC)  continue to be a clinical issue  5. 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  The patient has the following concerns regarding medicines:  Carvedilol dose was decreased to 12.5 mg twice a day.  The following changes/actions have been instituted:     continue aerobic activity   No change in medical therapy.  Labs/ tests ordered today include:  No orders of the defined types were placed in this encounter.      Disposition:   FU with HS in 6 months  Signed, Lesleigh Noe, MD  05/01/2015 5:52 PM    [facility] [address] [place], [place], [place]  [address] Phone: [phone]; Fax: [phone]

## 2015-05-02 ENCOUNTER — Encounter: Payer: Self-pay | Admitting: Interventional Cardiology

## 2015-05-02 ENCOUNTER — Ambulatory Visit (INDEPENDENT_AMBULATORY_CARE_PROVIDER_SITE_OTHER): Payer: Medicare Other | Admitting: Interventional Cardiology

## 2015-05-02 VITALS — BP 140/64 | HR 77 | Ht 61.5 in | Wt 108.1 lb

## 2015-05-02 DIAGNOSIS — N184 Chronic kidney disease, stage 4 (severe): Secondary | ICD-10-CM

## 2015-05-02 DIAGNOSIS — I10 Essential (primary) hypertension: Secondary | ICD-10-CM

## 2015-05-02 DIAGNOSIS — I5022 Chronic systolic (congestive) heart failure: Secondary | ICD-10-CM | POA: Diagnosis not present

## 2015-05-02 DIAGNOSIS — I25110 Atherosclerotic heart disease of native coronary artery with unstable angina pectoris: Secondary | ICD-10-CM | POA: Diagnosis not present

## 2015-05-02 DIAGNOSIS — J449 Chronic obstructive pulmonary disease, unspecified: Secondary | ICD-10-CM | POA: Diagnosis not present

## 2015-05-02 NOTE — Patient Instructions (Signed)
Medication Instructions:  Your physician recommends that you continue on your current medications as directed. Please refer to the Current Medication list given to you today.   Labwork: None ordered  Testing/Procedures: None ordered  Follow-Up: Your physician wants you to follow-up in: 4 months with Dr.Smith You will receive a reminder letter in the mail two months in advance. If you don't receive a letter, please call our office to schedule the follow-up appointment.   Any Other Special Instructions Will Be Listed Below (If Applicable).     If you need a refill on your cardiac medications before your next appointment, please call your pharmacy.   

## 2015-06-01 ENCOUNTER — Other Ambulatory Visit: Payer: Self-pay

## 2015-06-01 DIAGNOSIS — Z1231 Encounter for screening mammogram for malignant neoplasm of breast: Secondary | ICD-10-CM

## 2015-07-06 ENCOUNTER — Ambulatory Visit
Admission: RE | Admit: 2015-07-06 | Discharge: 2015-07-06 | Disposition: A | Discharge status: Home / Self Care | Payer: Medicare Other | Source: Ambulatory Visit

## 2015-07-06 DIAGNOSIS — Z1231 Encounter for screening mammogram for malignant neoplasm of breast: Secondary | ICD-10-CM

## 2015-08-17 ENCOUNTER — Ambulatory Visit (INDEPENDENT_AMBULATORY_CARE_PROVIDER_SITE_OTHER): Payer: Medicare Other | Admitting: Interventional Cardiology

## 2015-08-17 ENCOUNTER — Encounter: Payer: Self-pay | Admitting: Interventional Cardiology

## 2015-08-17 ENCOUNTER — Other Ambulatory Visit: Payer: Self-pay | Admitting: *Deleted

## 2015-08-17 VITALS — BP 138/70 | HR 92 | Ht 61.5 in | Wt 109.0 lb

## 2015-08-17 DIAGNOSIS — E785 Hyperlipidemia, unspecified: Secondary | ICD-10-CM

## 2015-08-17 DIAGNOSIS — I5022 Chronic systolic (congestive) heart failure: Secondary | ICD-10-CM

## 2015-08-17 DIAGNOSIS — I10 Essential (primary) hypertension: Secondary | ICD-10-CM

## 2015-08-17 DIAGNOSIS — J449 Chronic obstructive pulmonary disease, unspecified: Secondary | ICD-10-CM

## 2015-08-17 DIAGNOSIS — Z992 Dependence on renal dialysis: Secondary | ICD-10-CM

## 2015-08-17 DIAGNOSIS — N186 End stage renal disease: Secondary | ICD-10-CM

## 2015-08-17 DIAGNOSIS — I25110 Atherosclerotic heart disease of native coronary artery with unstable angina pectoris: Secondary | ICD-10-CM

## 2015-08-17 NOTE — Patient Instructions (Addendum)
Medication Instructions:  Your physician recommends that you continue on your current medications as directed. Please refer to the Current Medication list given to you today.   Labwork: None ordered  Testing/Procedures: Your physician has requested that you have an echocardiogram. Echocardiography is a painless test that uses sound waves to create images of your heart. It provides your doctor with information about the size and shape of your heart and how well your heart's chambers and valves are working. This procedure takes approximately one hour. There are no restrictions for this procedure.    Follow-Up: Your physician wants you to follow-up in: 6 months with Dr.Smith You will receive a reminder letter in the mail two months in advance. If you don't receive a letter, please call our office to schedule the follow-up appointment.   Any Other Special Instructions Will Be Listed Below (If Applicable).     If you need a refill on your cardiac medications before your next appointment, please call your pharmacy.   

## 2015-08-17 NOTE — Progress Notes (Signed)
Cardiology Office Note   Date:  08/17/2015   ID:  Pamela [name], DOB 12/08/1950, MRN [id number]  PCP:  Georgann Housekeeper, MD  Cardiologist:  Lesleigh Noe, MD   Chief Complaint  Patient presents with  . Coronary Artery Disease  . Congestive Heart Failure      History of Present Illness: Pamela [name] is a 65 y.o. female who presents for Stage V chronic kidney disease now on dialysis, systolic heart failure acute onset late 2016 during sepsis episode, CAD with prior stenting, hypertension, and diabetes mellitus.  The patient is distraught that she has been turned down for kidney transplant.  Since starting dialysis the patient is now doing quite well. She is ambulating without oxygen. She looks the best I have seen her in quite some time. She states the major differences have been starting dialysis and getting her hemoglobin up. She denies angina. There is no orthopnea. Mean of her medications of been discontinued because dialysis does not allow the medications to be used. She denies orthopnea and PND.    Past Medical History  Diagnosis Date  . PONV (postoperative nausea and vomiting)   . Hypertension   . COPD (chronic obstructive pulmonary disease) (HCC)   . Diabetes mellitus without complication (HCC) 10-29-12  . Coronary artery disease   . Shortness of breath dyspnea   . Anxiety   . Chronic kidney disease   . Headache   . CHF (congestive heart failure) (HCC)   . Anemia     Past Surgical History  Procedure Laterality Date  . Abdominal hysterectomy    . Elbow surgery Right     tendon surgery  . Tubal ligation    . Breast surgery      cyst removed  . Cholecystectomy      '74-open  . Appendectomy      '74- open with gallbladder  . Cardiac catheterization      1 coronary stent placed  . Ganglion cyst excision Bilateral 10-29-12    wrist  . Anal fissure repair    . Colonoscopy with propofol N/A 11/17/2012    Procedure: COLONOSCOPY WITH PROPOFOL;  Surgeon:  Charolett Bumpers, MD;  Location: [facility];  Service: Endoscopy;  Laterality: N/A;  . Esophagogastroduodenoscopy (egd) with propofol N/A 11/17/2012    Procedure: ESOPHAGOGASTRODUODENOSCOPY (EGD) WITH PROPOFOL;  Surgeon: Charolett Bumpers, MD;  Location: [facility];  Service: Endoscopy;  Laterality: N/A;  . Left heart catheterization with coronary angiogram N/A 04/14/2014    Procedure: LEFT HEART CATHETERIZATION WITH CORONARY ANGIOGRAM;  Surgeon: Lesleigh Noe, MD;  Location: [facility] CATH LAB;  Service: Cardiovascular;  Laterality: N/A;  . Tee without cardioversion N/A 01/20/2015    Procedure: TRANSESOPHAGEAL ECHOCARDIOGRAM (TEE);  Surgeon: Pricilla Riffle, MD;  Location: [facility] ENDOSCOPY;  Service: Cardiovascular;  Laterality: N/A;  . Av fistula placement Left 01/31/2015    Procedure: ARTERIOVENOUS FISTULA CREATION-LEFT BRACHIO-CEPHALIC ;  Surgeon: Fransisco Hertz, MD;  Location: [facility] OR;  Service: Vascular;  Laterality: Left;  . Peripheral vascular catheterization N/A 03/09/2015    Procedure: Fistulagram;  Surgeon: Fransisco Hertz, MD;  Location: [facility] INVASIVE CV LAB;  Service: Cardiovascular;  Laterality: N/A;  . Coronary stent placement    . Av fistula placement Left 04/04/2015    Procedure: INSERTION OF ARTERIOVENOUS (AV) GORE-TEX GRAFT ARM;  Surgeon: Sherren Kerns, MD;  Location: [facility] OR;  Service: Vascular;  Laterality: Left;     Current Outpatient Prescriptions  Medication Sig Dispense  Refill  . ALPRAZolam (XANAX) 0.5 MG tablet Take 1 tablet (0.5 mg total) by mouth at bedtime.  0  . aspirin EC 81 MG tablet Take 81 mg by mouth daily.    Marland Kitchen atorvastatin (LIPITOR) 10 MG tablet Take 10 mg by mouth every morning.    . calcium acetate (PHOSLO) 667 MG tablet Take 3 tablets by mouth 3 (three) times daily.    . Insulin Glargine (LANTUS SOLOSTAR) 100 UNIT/ML Solostar Pen Inject 5 units into the skin twice daily.    Marland Kitchen latanoprost (XALATAN) 0.005 % ophthalmic solution Place 1 drop into both eyes at bedtime.    Marland Kitchen  losartan (COZAAR) 50 MG tablet Take 25 mg by mouth daily.    . multivitamin (RENA-VIT) TABS tablet Take 1 tablet by mouth daily.  5  . nitroGLYCERIN (NITROSTAT) 0.4 MG SL tablet Place 0.4 mg under the tongue every 5 (five) minutes as needed for chest pain.    . Nutritional Supplements (FEEDING SUPPLEMENT, NEPRO CARB STEADY,) LIQD Take 237 mLs by mouth daily. 30 Can 0  . salmeterol (SEREVENT) 50 MCG/DOSE diskus inhaler Inhale 1 puff into the lungs 2 (two) times daily.     No current facility-administered medications for this visit.    Allergies:   Review of patient's allergies indicates no known allergies.    Social History:  The patient  reports that she quit smoking about 22 years ago. Her smoking use included Cigarettes. She smoked 1.50 packs per day. She has never used smokeless tobacco. She reports that she does not drink alcohol or use illicit drugs.   Family History:  The patient's family history includes Cancer in her father and sister; Diabetes in her father and sister; Heart attack in her mother; Hypertension in her father, mother, sister, and sister.    ROS:  Please see the history of present illness.   Otherwise, review of systems are positive for headache, anxiety, easy bruising.   All other systems are reviewed and negative.    PHYSICAL EXAM: VS:  BP 138/70 mmHg  Pulse 92  Ht 5' 1.5" (1.562 m)  Wt 109 lb (49.442 kg)  BMI 20.26 kg/m2 , BMI Body mass index is 20.26 kg/(m^2). GEN: Well nourished, well developed, in no acute distress HEENT: normal Neck: no JVD, carotid bruits, or masses Cardiac: RRR.  There is no murmur, rub, or gallop. There is no edema. Respiratory:  clear to auscultation bilaterally, normal work of breathing. GI: soft, nontender, nondistended, + BS MS: no deformity or atrophy Skin: warm and dry, no rash Neuro:  Strength and sensation are intact Psych: euthymic mood, full affect   EKG:  EKG is not ordered today.    Recent Labs: 01/14/2015: B  Natriuretic Peptide 2160.6* 01/17/2015: TSH 1.486 01/18/2015: ALT 19 02/01/2015: Magnesium 2.0; Platelets 210 03/09/2015: BUN 47*; Creatinine, Ser 3.00* 04/04/2015: Hemoglobin 14.6; Potassium 4.8; Sodium 134*    Lipid Panel    Component Value Date/Time   TRIG 102 01/20/2015 0908      Wt Readings from Last 3 Encounters:  08/17/15 109 lb (49.442 kg)  05/02/15 108 lb 1.9 oz (49.043 kg)  04/19/15 109 lb (49.442 kg)      Other studies Reviewed: Additional studies/ records that were reviewed today include: reviewed last echo. The findings include last LVEF 35-40%.    ASSESSMENT AND PLAN:  1. Chronic kidney disease, stage IV (severe) (HCC) On diaysis and improved  2. Chronic systolic heart failure (HCC) I believe her LV function has improved since  dialysis was started  3. Essential hypertension Relatively low blood pressures on dialysis with discontinuation of most of her medications  4. COPD, severe (HCC) Respiratory status is markedly improved  5. Hyperlipidemia Not evaluated today    Current medicines are reviewed at length with the patient today.  The patient has the following concerns regarding medicines: none.  The following changes/actions have been instituted:    Reviewed recent electronic health records.  We'll check a repeat echo to document improvement in LV function  May need to reconsider transplantation if LV function is markedly improved. I over this may not been the only reason that she was turned down.  Labs/ tests ordered today include:   Orders Placed This Encounter  Procedures  . Echocardiogram     Disposition:   FU with HS in 1 year  Signed, Lesleigh Noe, MD  08/17/2015 11:30 AM    [facility] [address] [place], [place], [place]  [address] Phone: [phone]; Fax: [phone]

## 2015-08-26 ENCOUNTER — Telehealth: Payer: Self-pay | Admitting: Interventional Cardiology

## 2015-08-26 NOTE — Telephone Encounter (Signed)
None

## 2015-09-05 ENCOUNTER — Ambulatory Visit (HOSPITAL_COMMUNITY): Payer: Medicare Other | Attending: Cardiology

## 2015-09-05 ENCOUNTER — Other Ambulatory Visit: Payer: Self-pay

## 2015-09-05 DIAGNOSIS — N189 Chronic kidney disease, unspecified: Secondary | ICD-10-CM | POA: Insufficient documentation

## 2015-09-05 DIAGNOSIS — I340 Nonrheumatic mitral (valve) insufficiency: Secondary | ICD-10-CM | POA: Insufficient documentation

## 2015-09-05 DIAGNOSIS — E1122 Type 2 diabetes mellitus with diabetic chronic kidney disease: Secondary | ICD-10-CM | POA: Insufficient documentation

## 2015-09-05 DIAGNOSIS — I5022 Chronic systolic (congestive) heart failure: Secondary | ICD-10-CM | POA: Insufficient documentation

## 2015-09-05 DIAGNOSIS — I130 Hypertensive heart and chronic kidney disease with heart failure and stage 1 through stage 4 chronic kidney disease, or unspecified chronic kidney disease: Secondary | ICD-10-CM | POA: Diagnosis not present

## 2015-09-05 DIAGNOSIS — I313 Pericardial effusion (noninflammatory): Secondary | ICD-10-CM | POA: Diagnosis not present

## 2015-09-05 DIAGNOSIS — I2510 Atherosclerotic heart disease of native coronary artery without angina pectoris: Secondary | ICD-10-CM | POA: Insufficient documentation

## 2015-09-07 ENCOUNTER — Telehealth: Payer: Self-pay | Admitting: Interventional Cardiology

## 2015-09-07 ENCOUNTER — Telehealth: Payer: Self-pay

## 2015-09-07 NOTE — Telephone Encounter (Signed)
Spoke with pt see echo reslts

## 2015-09-07 NOTE — Telephone Encounter (Signed)
Follow UP   Pt states that she returned the call.

## 2015-09-07 NOTE — Telephone Encounter (Signed)
Echo results faxed to [facility] attn: Reesa Chew, RN (transplant coordinator) @ pts rqst. Results also fwd to Dr.Deterding @ [facility].

## 2015-09-07 NOTE — Telephone Encounter (Signed)
Follow Up   Pt called back. She wanted to make sure that everything was faxed correctly

## 2015-10-31 ENCOUNTER — Ambulatory Visit
Admission: RE | Admit: 2015-10-31 | Discharge: 2015-10-31 | Disposition: A | Discharge status: Home / Self Care | Payer: Medicare Other | Source: Ambulatory Visit | Attending: Nurse Practitioner | Admitting: Nurse Practitioner

## 2015-10-31 ENCOUNTER — Other Ambulatory Visit: Payer: Self-pay | Admitting: Nurse Practitioner

## 2015-10-31 DIAGNOSIS — N6320 Unspecified lump in the left breast, unspecified quadrant: Secondary | ICD-10-CM

## 2015-11-16 ENCOUNTER — Telehealth: Payer: Self-pay | Admitting: Interventional Cardiology

## 2015-11-22 NOTE — Telephone Encounter (Signed)
No data:

## 2015-12-12 ENCOUNTER — Telehealth: Payer: Self-pay | Admitting: Interventional Cardiology

## 2016-01-16 ENCOUNTER — Telehealth: Payer: Self-pay | Admitting: Interventional Cardiology

## 2016-01-16 NOTE — Telephone Encounter (Signed)
New Message  Pt call requesting to speak with RN. Pt states there was a form faxed to [facility] for pt to have a surgical procedure. Pt states that the fax needs to be sent to the center of the procedure and not the Dr. Isidore Moos.   Pt states the fax needs to be sent to the attention of Laurena Slimmer [phone]  Please call back to discuss

## 2016-01-16 NOTE — Telephone Encounter (Signed)
Pt calling into the office to inform us that she will be going to her Surgeon's office this Wednesday, and she will need to have them re-fax a form for Dr Katrinka Blazing to review and advise on clearance needed, then send back accordingly.  Pt states Misty Stanley faxed some info to them back in April, but this was incorrectly sent to the wrong place, for the pt states it needs to be sent to her Surgeon's office instead.  Pt needs clearance for a transplant.  Pt needs the appropriate contact information to have her Surgeon's office fax the clearance to Dr Katrinka Blazing to advise on.  Informed the pt that I spoke with Selena Batten in Medical Records, and the pt should have the clearance note sent to [phone] ATTENTION:  Dr Creola Corn, and Misty Stanley.  Informed the pt that Selena Batten will receive this and be able to deliver this to Dr Katrinka Blazing upon return to the office on Thursday.  Pt verbalized understanding, agrees with this plan, and gracious for all the assistance provided.

## 2016-01-24 ENCOUNTER — Telehealth: Payer: Self-pay | Admitting: Interventional Cardiology

## 2016-01-24 DIAGNOSIS — Z01810 Encounter for preprocedural cardiovascular examination: Secondary | ICD-10-CM

## 2016-01-24 DIAGNOSIS — I2510 Atherosclerotic heart disease of native coronary artery without angina pectoris: Secondary | ICD-10-CM

## 2016-01-24 NOTE — Telephone Encounter (Signed)
Please schedule the patient for a Lexiscan myocardial perfusion study. Get contact information concerning the patient's transplant surgeon so that the information can be faxed when we have the result.

## 2016-01-24 NOTE — Telephone Encounter (Signed)
Pt's husband called to update Dr Katrinka Blazing about pt's incoming kidney transplant. Pt's husband states that the transplant surgeon's office called  today to let pt know that she will need to have a stress test prior having the kidney transplant. Husband and pt are aware that this message  will be send to Dr. Katrinka Blazing for recommendations.

## 2016-01-24 NOTE — Telephone Encounter (Signed)
New message      Husband states wife needs a stress test in order to be a canadate for kidney transplant.  Dr Michaelle Copas nurse is aware of this--per husband.  Talk to Dr Michaelle Copas nurse regarding getting a stress test.  Ok to wait for Misty Stanley to return.

## 2016-01-25 ENCOUNTER — Other Ambulatory Visit: Payer: Self-pay | Admitting: Nephrology

## 2016-01-25 DIAGNOSIS — I998 Other disorder of circulatory system: Secondary | ICD-10-CM

## 2016-01-26 NOTE — Telephone Encounter (Signed)
Pt and pt wife aware that a scheduler from our office will call them to schedule her Lexiscan. Pt will call back to provide the surgeons info so that we can fwd the clearance once it becomes available.

## 2016-01-30 ENCOUNTER — Ambulatory Visit (HOSPITAL_COMMUNITY): Payer: Medicare Other | Attending: Cardiology

## 2016-01-30 DIAGNOSIS — I501 Left ventricular failure: Secondary | ICD-10-CM | POA: Diagnosis not present

## 2016-01-30 DIAGNOSIS — Z01810 Encounter for preprocedural cardiovascular examination: Secondary | ICD-10-CM | POA: Insufficient documentation

## 2016-01-30 DIAGNOSIS — I2510 Atherosclerotic heart disease of native coronary artery without angina pectoris: Secondary | ICD-10-CM | POA: Diagnosis not present

## 2016-01-30 LAB — MYOCARDIAL PERFUSION IMAGING
CHL CUP NUCLEAR SDS: 1
CHL CUP NUCLEAR SSS: 4
LHR: 0.25
LV dias vol: 107 mL (ref 46–106)
LVSYSVOL: 60 mL
Peak HR: 111 {beats}/min
Rest HR: 78 {beats}/min
SRS: 3
TID: 0.9

## 2016-01-30 MED ORDER — TECHNETIUM TC 99M TETROFOSMIN IV KIT
10.8000 | PACK | Freq: Once | INTRAVENOUS | Status: AC | PRN
  Administered 2016-01-30: 11 via INTRAVENOUS
  Filled 2016-01-30: qty 11

## 2016-01-30 MED ORDER — REGADENOSON 0.4 MG/5ML IV SOLN
0.4000 mg | Freq: Once | INTRAVENOUS | Status: AC
  Administered 2016-01-30: 0.4 mg via INTRAVENOUS

## 2016-01-30 MED ORDER — TECHNETIUM TC 99M TETROFOSMIN IV KIT
32.3000 | PACK | Freq: Once | INTRAVENOUS | Status: AC | PRN
  Administered 2016-01-30: 32.3 via INTRAVENOUS
  Filled 2016-01-30: qty 32

## 2016-02-01 ENCOUNTER — Ambulatory Visit
Admission: RE | Admit: 2016-02-01 | Discharge: 2016-02-01 | Disposition: A | Discharge status: Home / Self Care | Payer: Medicare Other | Source: Ambulatory Visit | Attending: Nephrology | Admitting: Nephrology

## 2016-02-01 DIAGNOSIS — I998 Other disorder of circulatory system: Secondary | ICD-10-CM

## 2016-03-19 ENCOUNTER — Other Ambulatory Visit: Payer: Self-pay | Admitting: Nurse Practitioner

## 2016-03-19 ENCOUNTER — Ambulatory Visit
Admission: RE | Admit: 2016-03-19 | Discharge: 2016-03-19 | Disposition: A | Discharge status: Home / Self Care | Payer: Medicare Other | Source: Ambulatory Visit | Attending: Nurse Practitioner | Admitting: Nurse Practitioner

## 2016-03-19 ENCOUNTER — Other Ambulatory Visit (HOSPITAL_COMMUNITY)
Admission: RE | Admit: 2016-03-19 | Discharge: 2016-03-19 | Disposition: A | Discharge status: Home / Self Care | Payer: Medicare Other | Source: Ambulatory Visit | Attending: Nurse Practitioner | Admitting: Nurse Practitioner

## 2016-03-19 DIAGNOSIS — Z01419 Encounter for gynecological examination (general) (routine) without abnormal findings: Secondary | ICD-10-CM | POA: Diagnosis present

## 2016-03-19 DIAGNOSIS — N6310 Unspecified lump in the right breast, unspecified quadrant: Secondary | ICD-10-CM

## 2016-03-19 DIAGNOSIS — Z1151 Encounter for screening for human papillomavirus (HPV): Secondary | ICD-10-CM | POA: Diagnosis present

## 2016-03-20 LAB — CYTOLOGY - PAP
Diagnosis: NEGATIVE
HPV (WINDOPATH): NOT DETECTED

## 2016-03-28 ENCOUNTER — Encounter: Payer: Self-pay | Admitting: Interventional Cardiology

## 2016-03-28 ENCOUNTER — Ambulatory Visit (INDEPENDENT_AMBULATORY_CARE_PROVIDER_SITE_OTHER): Payer: Medicare Other | Admitting: Interventional Cardiology

## 2016-03-28 ENCOUNTER — Encounter (INDEPENDENT_AMBULATORY_CARE_PROVIDER_SITE_OTHER): Payer: Self-pay

## 2016-03-28 VITALS — BP 160/64 | HR 87 | Ht 61.5 in | Wt 115.1 lb

## 2016-03-28 DIAGNOSIS — I2510 Atherosclerotic heart disease of native coronary artery without angina pectoris: Secondary | ICD-10-CM | POA: Diagnosis not present

## 2016-03-28 DIAGNOSIS — N186 End stage renal disease: Secondary | ICD-10-CM

## 2016-03-28 DIAGNOSIS — J449 Chronic obstructive pulmonary disease, unspecified: Secondary | ICD-10-CM

## 2016-03-28 DIAGNOSIS — I10 Essential (primary) hypertension: Secondary | ICD-10-CM | POA: Diagnosis not present

## 2016-03-28 DIAGNOSIS — I5022 Chronic systolic (congestive) heart failure: Secondary | ICD-10-CM | POA: Diagnosis not present

## 2016-03-28 DIAGNOSIS — E0859 Diabetes mellitus due to underlying condition with other circulatory complications: Secondary | ICD-10-CM

## 2016-03-28 DIAGNOSIS — Z992 Dependence on renal dialysis: Secondary | ICD-10-CM

## 2016-03-28 DIAGNOSIS — Z794 Long term (current) use of insulin: Secondary | ICD-10-CM

## 2016-03-28 NOTE — Patient Instructions (Signed)
Medication Instructions:  None  Labwork: None  Testing/Procedures: None  Follow-Up: Your physician wants you to follow-up in: 1 year with Dr. Katrinka Blazing.  You will receive a reminder letter in the mail two months in advance. If you don't receive a letter, please call our office to schedule the follow-up appointment.   Any Other Special Instructions Will Be Listed Below (If Applicable).     If you need a refill on your cardiac medications before your next appointment, please call your pharmacy.  3

## 2016-03-28 NOTE — Progress Notes (Signed)
Cardiology Office Note    Date:  03/28/2016   ID:  Pamela [name], DOB 03-11-1951, MRN [id number]  PCP:  Georgann HousekeeperHUSAIN,KARRAR, MD  Cardiologist: Lesleigh NoeHenry W Keelan Tripodi III, MD   Chief Complaint  Patient presents with  . Coronary Artery Disease  . Hypertension  . 6 month f/u    History of Present Illness:  Pamela [name] is a 65 y.o. female female who presents for Stage V chronic kidney disease now on dialysis, systolic heart failure acute onset late 2016 during sepsis episode, CAD with prior stenting, hypertension, and diabetes mellitus.   She is on dialysis, Monday, Wednesday, and Friday. She has undergone [facility]transplant [facility]. A cardiopulmonary function test was performed, but results are not available. She saw Dr. Pablo LawrenceScott Sanoff. She has not heard back about her eligibility.  She denies angina, dyspnea, orthopnea, palpitations, and syncope. She has not needed nitroglycerin.   Past Medical History:  Diagnosis Date  . Anemia   . Anxiety   . CHF (congestive heart failure) (HCC)   . Chronic kidney disease   . COPD (chronic obstructive pulmonary disease) (HCC)   . Coronary artery disease   . Diabetes mellitus without complication (HCC) 10-29-12  . Headache   . Hypertension   . PONV (postoperative nausea and vomiting)   . Shortness of breath dyspnea     Past Surgical History:  Procedure Laterality Date  . ABDOMINAL HYSTERECTOMY    . ANAL FISSURE REPAIR    . APPENDECTOMY     '74- open with gallbladder  . AV FISTULA PLACEMENT Left 01/31/2015   Procedure: ARTERIOVENOUS FISTULA CREATION-LEFT BRACHIO-CEPHALIC ;  Surgeon: Fransisco HertzBrian L Chen, MD;  [facility];  Service: Vascular;  Laterality: Left;  . AV FISTULA PLACEMENT Left 04/04/2015   Procedure: INSERTION OF ARTERIOVENOUS (AV) GORE-TEX GRAFT ARM;  Surgeon: Sherren Kernsharles E Fields, MD;  [facility];  Service: Vascular;  Laterality: Left;  . BREAST SURGERY     cyst removed  . CARDIAC CATHETERIZATION      1 coronary stent placed  . CHOLECYSTECTOMY     '74-open  . COLONOSCOPY WITH PROPOFOL N/A 11/17/2012   Procedure: COLONOSCOPY WITH PROPOFOL;  Surgeon: Charolett BumpersMartin K Johnson, MD;  Location: [facility] ENDOSCOPY;  Service: Endoscopy;  Laterality: N/A;  . CORONARY STENT PLACEMENT    . ELBOW SURGERY Right    tendon surgery  . ESOPHAGOGASTRODUODENOSCOPY (EGD) WITH PROPOFOL N/A 11/17/2012   Procedure: ESOPHAGOGASTRODUODENOSCOPY (EGD) WITH PROPOFOL;  Surgeon: Charolett BumpersMartin K Johnson, MD;  Location: [facility]OSCOPY;  Service: Endoscopy;  Laterality: N/A;  . GANGLION CYST EXCISION Bilateral 10-29-12   wrist  . LEFT HEART CATHETERIZATION WITH CORONARY ANGIOGRAM N/A 04/14/2014   Procedure: LEFT HEART CATHETERIZATION WITH CORONARY ANGIOGRAM;  Surgeon: Lesleigh NoeHenry W Brad Lieurance III, MD;  [facility];  Service: Cardiovascular;  Laterality: N/A;  . PERIPHERAL VASCULAR CATHETERIZATION N/A 03/09/2015   Procedure: Fistulagram;  Surgeon: Fransisco HertzBrian L Chen, MD;  [facility];  Service: Cardiovascular;  Laterality: N/A;  . TEE WITHOUT CARDIOVERSION N/A 01/20/2015   Procedure: TRANSESOPHAGEAL ECHOCARDIOGRAM (TEE);  Surgeon: Pricilla RifflePaula Ross V, MD;  [facility];  Service: Cardiovascular;  Laterality: N/A;  . TUBAL LIGATION      Current Medications: Outpatient Medications Prior to Visit  Medication Sig Dispense Refill  . ALPRAZolam (XANAX) 0.5 MG tablet Take 1 tablet (0.5 mg total) by mouth at bedtime.  0  . aspirin EC 81 MG tablet Take 81 mg by mouth daily.    .Marland Kitchen  atorvastatin (LIPITOR) 10 MG tablet Take 10 mg by mouth every morning.    . Insulin Glargine (LANTUS SOLOSTAR) 100 UNIT/ML Solostar Pen Inject 5 units into the skin twice daily.    Marland Kitchen latanoprost (XALATAN) 0.005 % ophthalmic solution Place 1 drop into both eyes at bedtime.    . multivitamin (RENA-VIT) TABS tablet Take 1 tablet by mouth daily.  5  . nitroGLYCERIN (NITROSTAT) 0.4 MG SL tablet Place 0.4 mg under the tongue every 5 (five) minutes as needed for chest  pain.    . Nutritional Supplements (FEEDING SUPPLEMENT, NEPRO CARB STEADY,) LIQD Take 237 mLs by mouth daily. 30 Can 0  . calcium acetate (PHOSLO) 667 MG tablet Take 3 tablets by mouth 3 (three) times daily.    Marland Kitchen losartan (COZAAR) 50 MG tablet Take 25 mg by mouth daily.    . salmeterol (SEREVENT) 50 MCG/DOSE diskus inhaler Inhale 1 puff into the lungs 2 (two) times daily.     No facility-administered medications prior to visit.      Allergies:   Patient has no known allergies.   Social History   Social History  . Marital status: Married    Spouse name: N/A  . Number of children: N/A  . Years of education: N/A   Social History Main Topics  . Smoking status: Former Smoker    Packs/day: 1.50    Types: Cigarettes    Quit date: 10/29/1992  . Smokeless tobacco: Never Used  . Alcohol use No  . Drug use: No  . Sexual activity: Not Currently   Other Topics Concern  . None   Social History Narrative  . None     Family History:  The patient's family history includes Cancer in her father and sister; Diabetes in her father and sister; Heart attack in her mother; Hypertension in her father, mother, sister, and sister.   ROS:   Please see the history of present illness.    Head and anxiety about whether or not she would be a candidate for transplantation of her kidney. Otherwise no complaints.  All other systems reviewed and are negative.   PHYSICAL EXAM:   VS:  BP (!) 160/64   Pulse 87   Ht 5' 1.5" (1.562 m)   Wt 115 lb 1.9 oz (52.2 kg)   SpO2 98%   BMI 21.40 kg/m    GEN: Well nourished, well developed, in no acute distress  HEENT: normal  Neck: no JVD, carotid bruits, or masses Cardiac: RRR; no murmurs, or gallops,no edema . An S4 gallop is present. Respiratory:  clear to auscultation bilaterally, normal work of breathing GI: soft, nontender, nondistended, + BS MS: no deformity or atrophy  Skin: warm and dry, no rash Neuro:  Alert and Oriented x 3, Strength and sensation  are intact Psych: euthymic mood, full affect  Wt Readings from Last 3 Encounters:  03/28/16 115 lb 1.9 oz (52.2 kg)  08/17/15 109 lb (49.4 kg)  05/02/15 108 lb 1.9 oz (49 kg)      Studies/Labs Reviewed:   EKG:  EKG  By atrial abnormality. Rightward axis. Prominent voltage. Slight prolongation of QT. No change when compared to the prior tracing a year ago.  Recent Labs: 04/04/2015: Hemoglobin 14.6; Potassium 4.8; Sodium 134   Lipid Panel    Component Value Date/Time   TRIG 102 01/20/2015 0908    Additional studies/ records that were reviewed today include:   09/05/2015, Echocardiogram: ------------------------------------------------------------------- Study Conclusions  - Left ventricle: The cavity size  was normal. Wall thickness was   normal. Systolic function was moderately reduced. The estimated   ejection fraction was in the range of 35% to 40%. Diffuse   hypokinesis. Doppler parameters are consistent with abnormal left   ventricular relaxation (grade 1 diastolic dysfunction). - Aortic valve: There was no stenosis. - Mitral valve: There was trivial regurgitation. - Right ventricle: The cavity size was normal. Systolic function   was normal. - Pulmonary arteries: No complete TR doppler jet so unable to   estimate PA systolic pressure. - Inferior vena cava: The vessel was normal in size. The   respirophasic diameter changes were in the normal range (>= 50%),   consistent with normal central venous pressure. - Pericardium, extracardiac: A trivial pericardial effusion was   identified.  Impressions:  - Normal LV size with EF 35-40%, diffuse hypokinesis. Normal RV   size and systolic function. No significant valvular   abnormalities.   ASSESSMENT:    1. CAD in native artery   2. Chronic systolic heart failure (HCC)   3. Essential hypertension   4. COPD, severe (HCC)   5. ESRD on dialysis (HCC)   6. Diabetes mellitus due to underlying condition with other  circulatory complication, with long-term current use of insulin (HCC)      PLAN:  In order of problems listed above:  1. Asymptomatic with reference to angina. Recent cardiopulmonary stress test at [facility] is still pending. No ischemic symptoms. Continue current management unless the stress test dictates a different approach. 2. Volume status is excellent on dialysis. JVD is not present. Last EF was 35%. 3. Systolic pressures elevated this morning, but on the contrary went on dialysis she has significant drops. No adjustment in medical therapy is made because of fragility of hemodynamics on dialysis. 4. Not assessed 5. Monday, Wednesday, and Friday dialysis 6. Managed by primary care    Medication Adjustments/Labs and Tests Ordered: Current medicines are reviewed at length with the patient today.  Concerns regarding medicines are outlined above.  Medication changes, Labs and Tests ordered today are listed in the Patient Instructions below. There are no Patient Instructions on file for this visit.   Signed, Lesleigh Noe, MD  03/28/2016 8:06 AM    [facility] [address] [place], [place], [place]  [address] Phone: [phone]; Fax: [phone]

## 2016-04-12 ENCOUNTER — Telehealth: Payer: Self-pay | Admitting: Interventional Cardiology

## 2016-04-12 NOTE — Telephone Encounter (Signed)
New message    Pt husband verbalized that he wants to speak to Dr.Smith personally about his wife and [facility] he said that he is not clear on the medical terms

## 2016-04-12 NOTE — Telephone Encounter (Signed)
Pt's husband states pt recently seen again at [facility] and was tested for a kidney transplant.  Husband states that [facility] has decided that pt is not a favorable candidate for transplant.  Husband would like for Dr. Katrinka Blazing to look over pt's stress test that was completed at [facility] and give some insight on that.  Pt's husband, Casimiro Needle, would like to speak with Dr. Katrinka Blazing directly as he has other questions.  Advised Dr. Katrinka Blazing is out of office today but I would send message to him to review.  Husband appreciative for assistance.  DPR on file for husband.  Stress test can be found in Care Everywhere under documents.  Click on other results and CPX should be the first result.

## 2016-04-14 NOTE — Telephone Encounter (Signed)
The reason for turn down was:   "Pt discussed at committee review. Decision reached to: Deny as a transplant candidate d/t heavily calcified ileacs and Aorta, poor lung function, poor endurance and fragility. Patient did notify team that she has potential living donor yet the patient is too high of a surgical risk.  "    Heart was not the reason she was denied.

## 2016-04-15 NOTE — Telephone Encounter (Signed)
Spoke with husband and informed him of information provided by Dr. Katrinka Blazing.  Husband verbalized understanding and was appreciative for assitance.

## 2016-04-15 NOTE — Telephone Encounter (Signed)
Left message to call back  

## 2016-05-22 ENCOUNTER — Other Ambulatory Visit: Payer: Self-pay | Admitting: Internal Medicine

## 2016-05-22 DIAGNOSIS — Z1231 Encounter for screening mammogram for malignant neoplasm of breast: Secondary | ICD-10-CM

## 2016-07-09 ENCOUNTER — Ambulatory Visit
Admission: RE | Admit: 2016-07-09 | Discharge: 2016-07-09 | Disposition: A | Discharge status: Home / Self Care | Payer: Medicare Other | Source: Ambulatory Visit | Attending: Internal Medicine | Admitting: Internal Medicine

## 2016-07-09 DIAGNOSIS — Z1231 Encounter for screening mammogram for malignant neoplasm of breast: Secondary | ICD-10-CM

## 2016-12-23 ENCOUNTER — Other Ambulatory Visit: Payer: Self-pay | Admitting: Nephrology

## 2016-12-23 DIAGNOSIS — R0600 Dyspnea, unspecified: Secondary | ICD-10-CM

## 2016-12-24 ENCOUNTER — Ambulatory Visit (INDEPENDENT_AMBULATORY_CARE_PROVIDER_SITE_OTHER): Payer: Medicare Other | Admitting: Internal Medicine

## 2016-12-24 DIAGNOSIS — R0600 Dyspnea, unspecified: Secondary | ICD-10-CM | POA: Diagnosis not present

## 2016-12-24 LAB — PULMONARY FUNCTION TEST
DL/VA % pred: 65 %
DL/VA: 2.87 ml/min/mmHg/L
DLCO COR % PRED: 55 %
DLCO COR: 11.17 ml/min/mmHg
DLCO UNC: 11.69 ml/min/mmHg
DLCO unc % pred: 57 %
FEF 25-75 POST: 0.33 L/s
FEF 25-75 Pre: 0.27 L/sec
FEF2575-%Change-Post: 19 %
FEF2575-%PRED-PRE: 14 %
FEF2575-%Pred-Post: 17 %
FEV1-%CHANGE-POST: 4 %
FEV1-%PRED-POST: 37 %
FEV1-%Pred-Pre: 36 %
FEV1-POST: 0.79 L
FEV1-Pre: 0.75 L
FEV1FVC-%Change-Post: 0 %
FEV1FVC-%PRED-PRE: 61 %
FEV6-%Change-Post: 4 %
FEV6-%Pred-Post: 59 %
FEV6-%Pred-Pre: 57 %
FEV6-Post: 1.57 L
FEV6-Pre: 1.5 L
FEV6FVC-%CHANGE-POST: 0 %
FEV6FVC-%Pred-Post: 98 %
FEV6FVC-%Pred-Pre: 98 %
FVC-%CHANGE-POST: 5 %
FVC-%PRED-PRE: 57 %
FVC-%Pred-Post: 60 %
FVC-POST: 1.66 L
FVC-PRE: 1.58 L
PRE FEV6/FVC RATIO: 95 %
Post FEV1/FVC ratio: 47 %
Post FEV6/FVC ratio: 94 %
Pre FEV1/FVC ratio: 48 %
RV % pred: 168 %
RV: 3.33 L
TLC % PRED: 110 %
TLC: 5.09 L

## 2016-12-24 NOTE — Progress Notes (Signed)
PFT done today. 

## 2017-02-28 IMAGING — US US RENAL
1 series · 14 of 25 positions shown · non-contrast
Comparison: None.

CLINICAL DATA: Subsequent encounter for chronic kidney disease

EXAM:
RENAL / URINARY TRACT ULTRASOUND COMPLETE

[Series 1: us renal · 0.30mm/px · 14 of 55 slices shown]
[im 1/55]
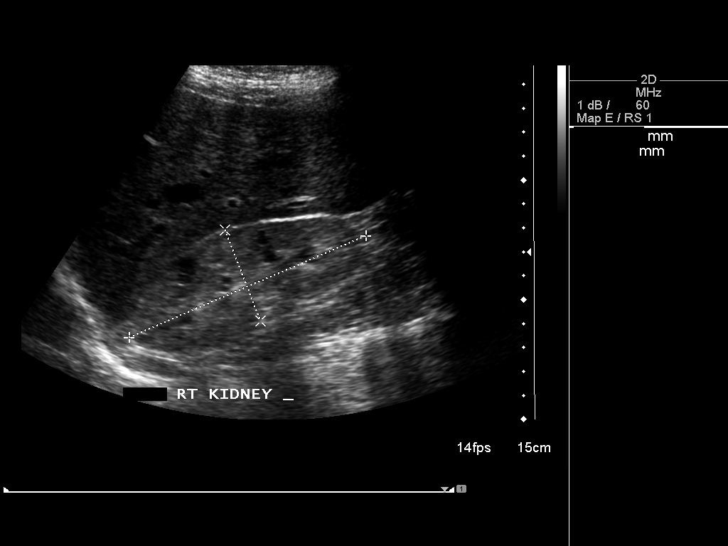
[im 5/55]
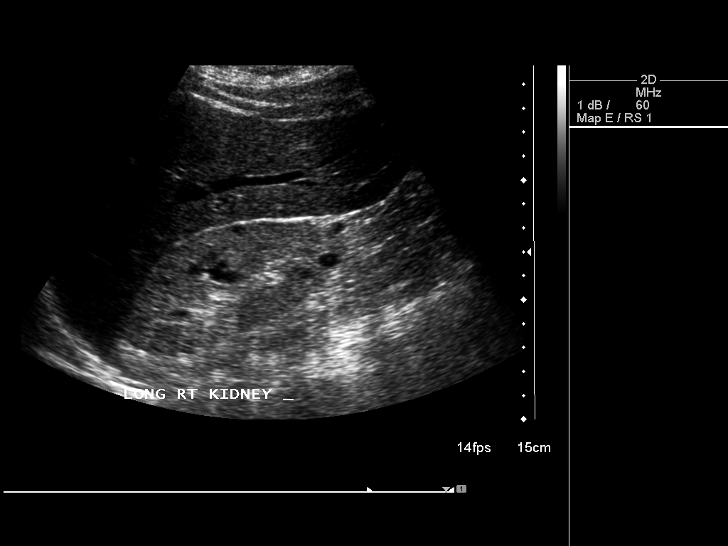
[im 10/55]
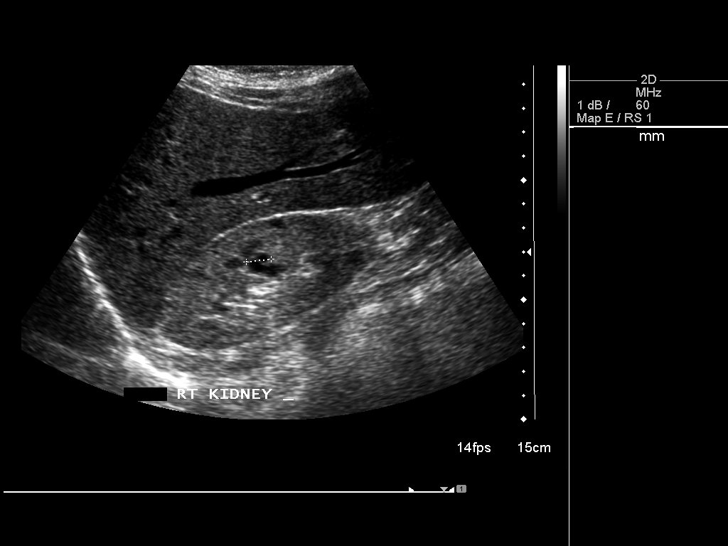
[im 14/55]
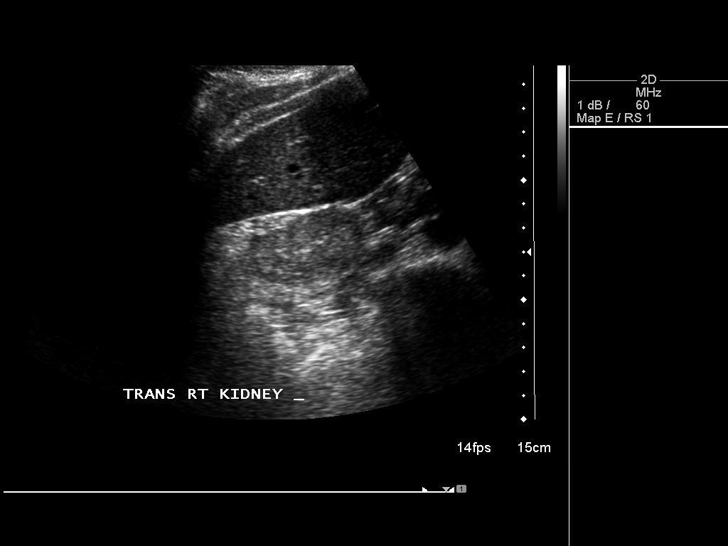
[im 19/55]
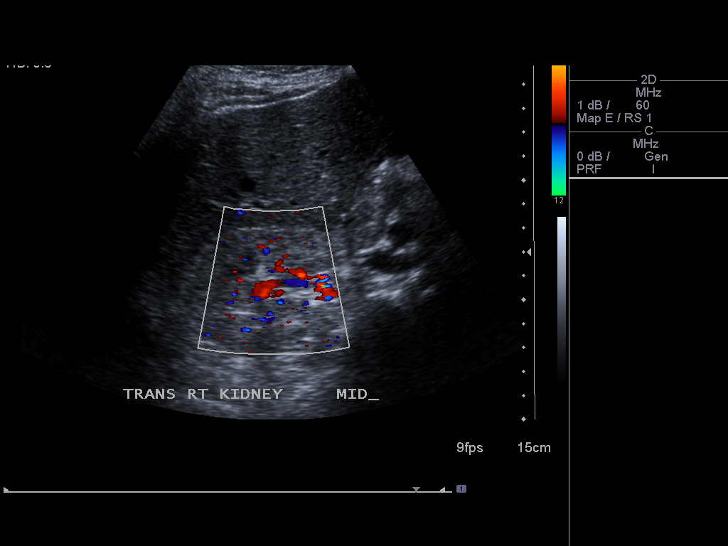
[im 21/55]
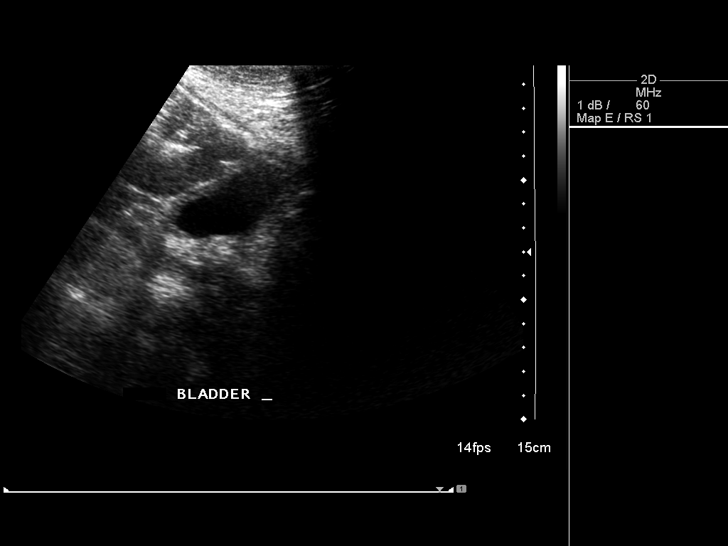
[im 25/55]
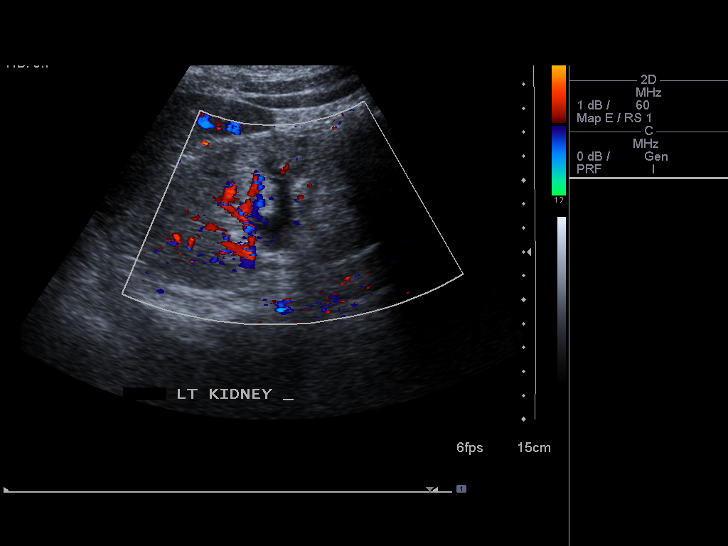
[im 30/55]
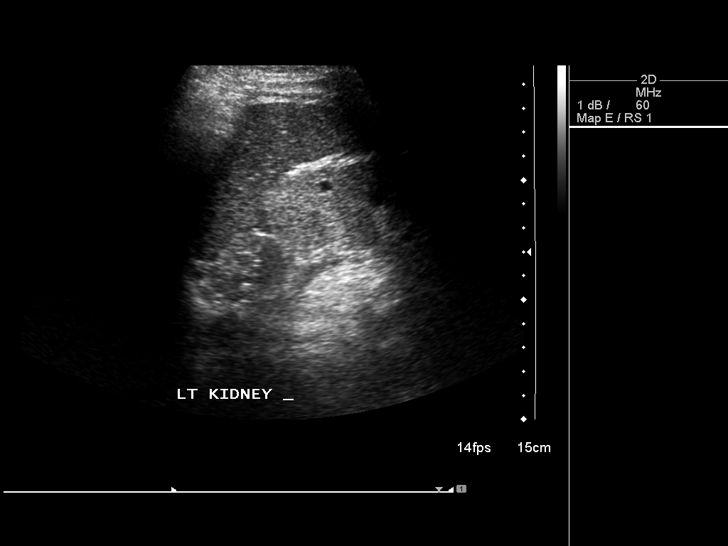
[im 34/55]
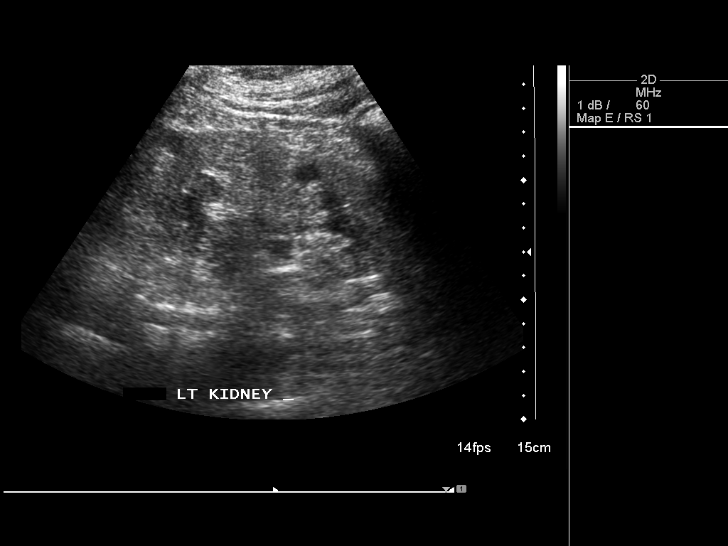
[im 37/55]
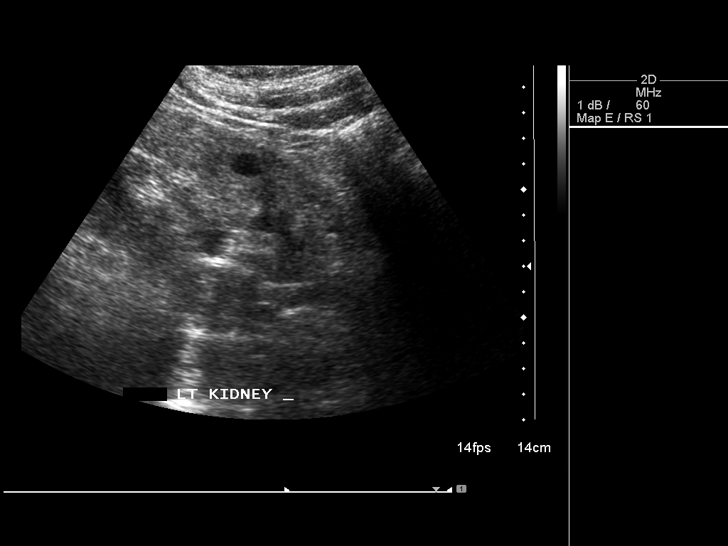
[im 41/55]
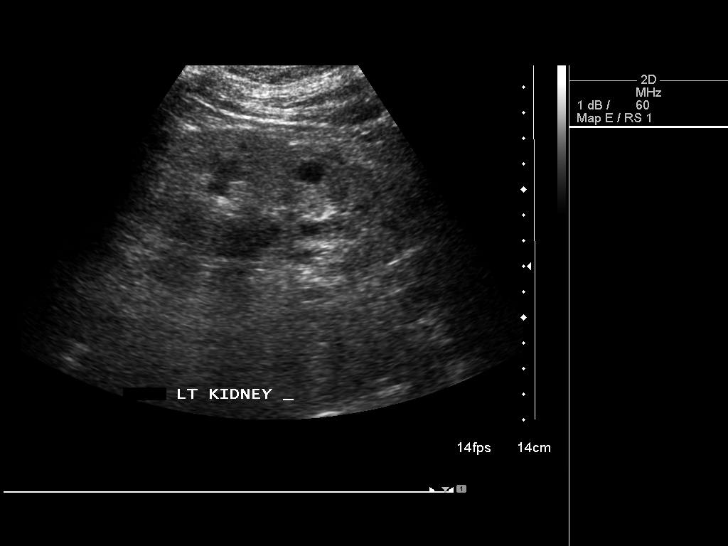
[im 46/55]
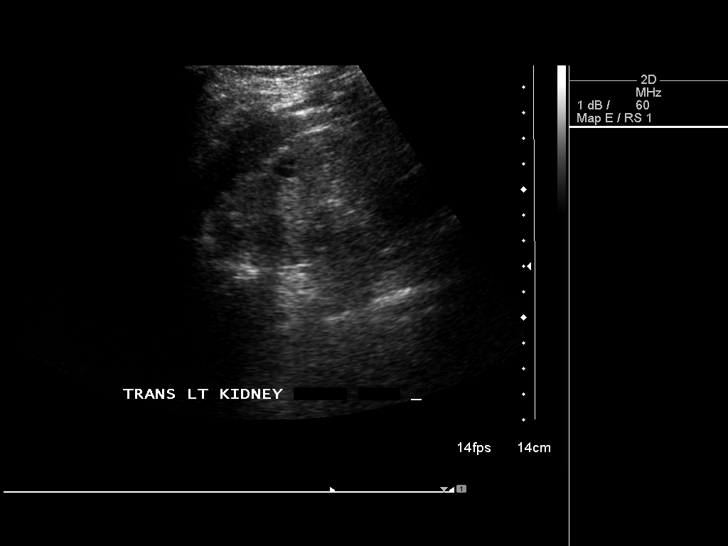
[im 50/55]
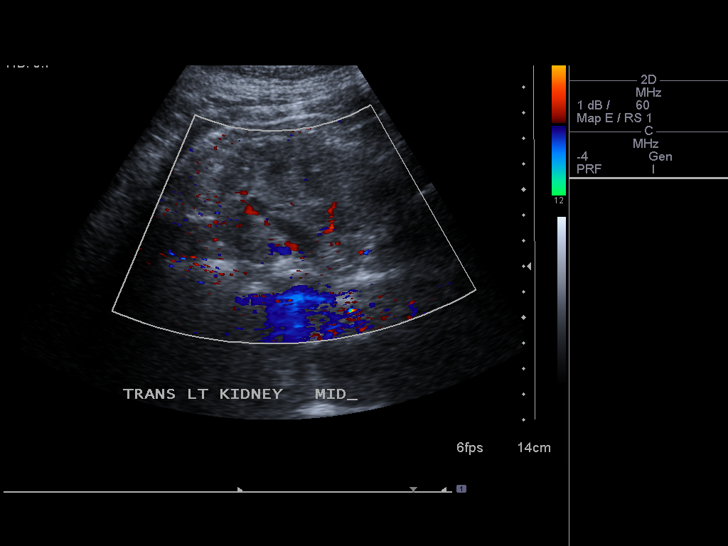
[im 55/55]
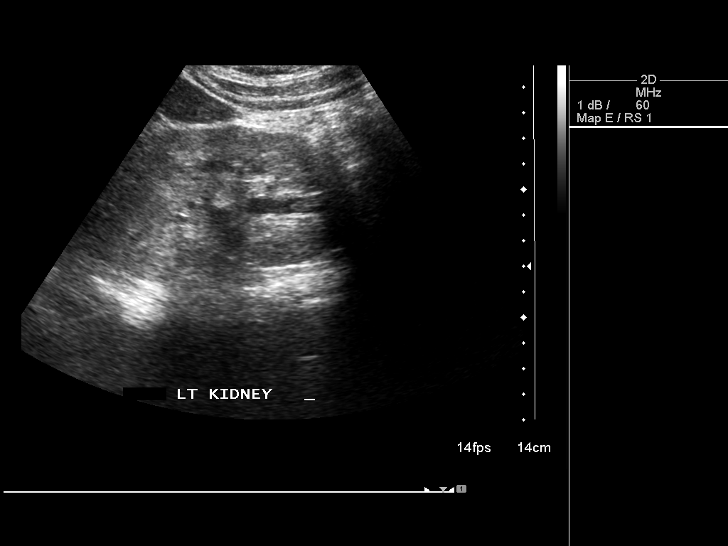

[14 of 25 positions shown; findings below may reference images not displayed]

FINDINGS: Right Kidney:

Length: 10.7 cm. Renal cortex is markedly echogenic. Numerous tiny
hypoechoic lesions scattered throughout are compatible with tiny
cysts measuring in the 5-11 mm size range. No evidence for
hydronephrosis.

Left Kidney:

Length: 11.4 cm. Echogenic renal parenchyma as on the contralateral
side. No overt hydronephrosis although there is some fullness of the
left intrarenal collecting system. Multiple small scattered
hypoechoic lesions are compatible with cysts.

Bladder:

Nondistended which may account for the slight bladder wall
thickening.
IMPRESSION: Bilateral renal cortical hyper echogenicity without hydronephrosis.
Imaging features are compatible with medical renal disease.

Numerous tiny bilateral renal cysts.

## 2017-04-07 NOTE — Progress Notes (Signed)
Cardiology Office Note    Date:  04/08/2017   ID:  Pamela [name], Pamela [name] 10-25-1950, MRN [id number]  PCP:  Georgann Housekeeper, MD  Cardiologist: Lesleigh Noe, MD   Chief Complaint  Patient presents with  . Coronary Artery Disease    History of Present Illness:  Pamela [name] is a 66 y.o. female female who presents for Stage V chronic kidney disease now on dialysis, systolic heart failure acute onset late 2016 during sepsis episode, CAD with prior stenting, hypertension, and diabetes mellitus.  Savahna denies angina.  She has history of remote stent implantation in the LAD in 2003.  She denies angina.  She does not have orthopnea PND.  She experiences hypotension during dialysis.  She is anticipating a kidney transplant.  She does not use nitroglycerin.  She has been walking up to a mild per day attempting to improve her physical conditioning for an upcoming cardiopulmonary stress test at [facility].  Recently antihypertensive therapy has been instituted by nephrology.  She is now on amlodipine 10 mg/day and metoprolol succinate 25 mg/day.   Past Medical History:  Diagnosis Date  . Anemia   . Anxiety   . CHF (congestive heart failure) (HCC)   . Chronic kidney disease   . COPD (chronic obstructive pulmonary disease) (HCC)   . Coronary artery disease   . Diabetes mellitus without complication (HCC) 10-29-12  . Headache   . Hypertension   . PONV (postoperative nausea and vomiting)   . Shortness of breath dyspnea     Past Surgical History:  Procedure Laterality Date  . ABDOMINAL HYSTERECTOMY    . ANAL FISSURE REPAIR    . APPENDECTOMY     '74- open with gallbladder  . AV FISTULA PLACEMENT Left 01/31/2015   Procedure: ARTERIOVENOUS FISTULA CREATION-LEFT BRACHIO-CEPHALIC ;  Surgeon: Fransisco Hertz, MD;  Location: [facility] OR;  Service: Vascular;  Laterality: Left;  . AV FISTULA PLACEMENT Left 04/04/2015   Procedure: INSERTION OF ARTERIOVENOUS (AV) GORE-TEX GRAFT ARM;  Surgeon: Sherren Kerns, MD;  Location: [facility] OR;  Service: Vascular;  Laterality: Left;  . BREAST SURGERY     cyst removed  . CARDIAC CATHETERIZATION     1 coronary stent placed  . CHOLECYSTECTOMY     '74-open  . COLONOSCOPY WITH PROPOFOL N/A 11/17/2012   Procedure: COLONOSCOPY WITH PROPOFOL;  Surgeon: Charolett Bumpers, MD;  Location: [facility] ENDOSCOPY;  Service: Endoscopy;  Laterality: N/A;  . CORONARY STENT PLACEMENT    . ELBOW SURGERY Right    tendon surgery  . ESOPHAGOGASTRODUODENOSCOPY (EGD) WITH PROPOFOL N/A 11/17/2012   Procedure: ESOPHAGOGASTRODUODENOSCOPY (EGD) WITH PROPOFOL;  Surgeon: Charolett Bumpers, MD;  Location: [facility] ENDOSCOPY;  Service: Endoscopy;  Laterality: N/A;  . GANGLION CYST EXCISION Bilateral 10-29-12   wrist  . LEFT HEART CATHETERIZATION WITH CORONARY ANGIOGRAM N/A 04/14/2014   Procedure: LEFT HEART CATHETERIZATION WITH CORONARY ANGIOGRAM;  Surgeon: Lesleigh Noe, MD;  Location: [facility] CATH LAB;  Service: Cardiovascular;  Laterality: N/A;  . PERIPHERAL VASCULAR CATHETERIZATION N/A 03/09/2015   Procedure: Fistulagram;  Surgeon: Fransisco Hertz, MD;  Location: [facility] INVASIVE CV LAB;  Service: Cardiovascular;  Laterality: N/A;  . TEE WITHOUT CARDIOVERSION N/A 01/20/2015   Procedure: TRANSESOPHAGEAL ECHOCARDIOGRAM (TEE);  Surgeon: Pricilla Riffle, MD;  Location: [facility] ENDOSCOPY;  Service: Cardiovascular;  Laterality: N/A;  . TUBAL LIGATION      Current Medications: Outpatient Medications Prior to Visit  Medication Sig Dispense Refill  . ALPRAZolam (XANAX) 0.5  MG tablet Take 1 tablet (0.5 mg total) by mouth at bedtime.  0  . amLODipine (NORVASC) 10 MG tablet Take 10 mg by mouth daily.    Marland Kitchen. aspirin EC 81 MG tablet Take 81 mg by mouth daily.    Marland Kitchen. atorvastatin (LIPITOR) 10 MG tablet Take 10 mg by mouth every morning.    . Insulin Glargine (LANTUS SOLOSTAR) 100 UNIT/ML Solostar Pen Inject 5 units into the skin twice daily.    Marland Kitchen. latanoprost (XALATAN) 0.005 % ophthalmic solution Place 1 drop into both eyes at  bedtime.    . metoprolol succinate (TOPROL-XL) 25 MG 24 hr tablet Take 25 mg by mouth daily.    . multivitamin (RENA-VIT) TABS tablet Take 1 tablet by mouth daily.  5  . nitroGLYCERIN (NITROSTAT) 0.4 MG SL tablet Place 0.4 mg under the tongue every 5 (five) minutes as needed for chest pain.    . Nutritional Supplements (FEEDING SUPPLEMENT, NEPRO CARB STEADY,) LIQD Take 237 mLs by mouth daily. 30 Can 0   No facility-administered medications prior to visit.      Allergies:   Patient has no known allergies.   Social History   Socioeconomic History  . Marital status: Married    Spouse name: None  . Number of children: None  . Years of education: None  . Highest education level: None  Social Needs  . Financial resource strain: None  . Food insecurity - worry: None  . Food insecurity - inability: None  . Transportation needs - medical: None  . Transportation needs - non-medical: None  Occupational History  . None  Tobacco Use  . Smoking status: Former Smoker    Packs/day: 1.50    Types: Cigarettes    Last attempt to quit: 10/29/1992    Years since quitting: 24.4  . Smokeless tobacco: Never Used  Substance and Sexual Activity  . Alcohol use: No    Alcohol/week: 0.0 oz  . Drug use: No  . Sexual activity: Not Currently  Other Topics Concern  . None  Social History Narrative  . None     Family History:  The patient's family history includes Breast cancer in her sister; Cancer in her father and sister; Diabetes in her father and sister; Heart attack in her mother; Hypertension in her father, mother, sister, and sister.   ROS:   Please see the history of present illness.    Mild dyspnea on exertion All other systems reviewed and are negative.   PHYSICAL EXAM:   VS:  BP (!) 152/60   Pulse 86   Ht 5' 1.5" (1.562 m)   Wt 123 lb 1.9 oz (55.8 kg)   BMI 22.89 kg/m    GEN: Well nourished, well developed, in no acute distress  HEENT: normal  Neck: no JVD, but there are  bilateral soft carotid bruits.  No masses. Cardiac: RRR; no murmurs, rubs, or gallops,no edema  Respiratory:  clear to auscultation bilaterally, normal work of breathing GI: soft, nontender, nondistended, + BS MS: no deformity or atrophy  Skin: warm and dry, no rash Neuro:  Alert and Oriented x 3, Strength and sensation are intact Psych: euthymic mood, full affect  Wt Readings from Last 3 Encounters:  04/08/17 123 lb 1.9 oz (55.8 kg)  03/28/16 115 lb 1.9 oz (52.2 kg)  08/17/15 109 lb (49.4 kg)      Studies/Labs Reviewed:   EKG:  EKG P pulmonale.  Nonspecific ST-T wave abnormality.  Sinus rhythm.  Rightward axis.  Recent Labs: No results found for requested labs within last 8760 hours.   Lipid Panel    Component Value Date/Time   TRIG 102 01/20/2015 0908    Additional studies/ records that were reviewed today include:  Echocardiogram 2017: ------------------------------------------------------------------- Study Conclusions   - Left ventricle: The cavity size was normal. Wall thickness was   normal. Systolic function was moderately reduced. The estimated   ejection fraction was in the range of 35% to 40%. Diffuse   hypokinesis. Doppler parameters are consistent with abnormal left   ventricular relaxation (grade 1 diastolic dysfunction). - Aortic valve: There was no stenosis. - Mitral valve: There was trivial regurgitation. - Right ventricle: The cavity size was normal. Systolic function   was normal. - Pulmonary arteries: No complete TR doppler jet so unable to   estimate PA systolic pressure. - Inferior vena cava: The vessel was normal in size. The   respirophasic diameter changes were in the normal range (>= 50%),   consistent with normal central venous pressure. - Pericardium, extracardiac: A trivial pericardial effusion was   identified.   Impressions:   - Normal LV size with EF 35-40%, diffuse hypokinesis. Normal RV   size and systolic function. No  significant valvular   abnormalities.  CARDIAC catheterization 2003: CONCLUSIONS:  1. Widely patent left anterior descending stent with at most 30 to 50% mid     stent narrowing.  The first septal perforator that arises from within the     stented region is 80%.  The first diagonal at a region of bifurcation in     its mid segment contains an 80 to 90% narrowing.  This is unchanged from     prior evaluation.  Circumflex and right coronary arteries are normal.  2. Normal left ventricular function with normal left ventricular pressure.  3. Dyspnea on exertion.  Etiology uncertain.  Related possibly to diastolic     dysfunction.   ASSESSMENT:    1. CAD in native artery   2. Chronic systolic heart failure (HCC)   3. Essential hypertension   4. Other emphysema (HCC)   5. Diabetes mellitus due to underlying condition with other circulatory complication, with long-term current use of insulin (HCC)      PLAN:  In order of problems listed above:  1. Stable without anginal complaints.  Continue same medical therapy. 2. Last LVEF documented to be 35-40%. 3. Blood pressure is elevated.  I will not adjust medical regimen since this is being handled by nephrology. 4. A significant limiting factor in her exertional tolerance. 5. Not addressed.  Clinically stable.  Awaiting further decision about transplantation.  No overt clinical symptoms of angina/ischemia.  Medication Adjustments/Labs and Tests Ordered: Current medicines are reviewed at length with the patient today.  Concerns regarding medicines are outlined above.  Medication changes, Labs and Tests ordered today are listed in the Patient Instructions below. Patient Instructions  Medication Instructions:  Your physician recommends that you continue on your current medications as directed. Please refer to the Current Medication list given to you today.   Labwork: None  Testing/Procedures: None  Follow-Up: Your physician wants  you to follow-up in: 1 year with Dr. Katrinka Blazing. You will receive a reminder letter in the mail two months in advance. If you don't receive a letter, please call our office to schedule the follow-up appointment.   Any Other Special Instructions Will Be Listed Below (If Applicable).     If you need a refill on your cardiac medications  before your next appointment, please call your pharmacy.      Signed, Lesleigh Noe, MD  04/08/2017 9:33 AM    [facility] [address] [place], [place], [place]  [address] Phone: [phone]; Fax: [phone]

## 2017-04-08 ENCOUNTER — Encounter: Payer: Self-pay | Admitting: Interventional Cardiology

## 2017-04-08 ENCOUNTER — Ambulatory Visit (INDEPENDENT_AMBULATORY_CARE_PROVIDER_SITE_OTHER): Payer: Medicare Other | Admitting: Interventional Cardiology

## 2017-04-08 VITALS — BP 152/60 | HR 86 | Ht 61.5 in | Wt 123.1 lb

## 2017-04-08 DIAGNOSIS — I5022 Chronic systolic (congestive) heart failure: Secondary | ICD-10-CM

## 2017-04-08 DIAGNOSIS — J438 Other emphysema: Secondary | ICD-10-CM

## 2017-04-08 DIAGNOSIS — I10 Essential (primary) hypertension: Secondary | ICD-10-CM

## 2017-04-08 DIAGNOSIS — I2510 Atherosclerotic heart disease of native coronary artery without angina pectoris: Secondary | ICD-10-CM

## 2017-04-08 DIAGNOSIS — E0859 Diabetes mellitus due to underlying condition with other circulatory complications: Secondary | ICD-10-CM

## 2017-04-08 DIAGNOSIS — Z794 Long term (current) use of insulin: Secondary | ICD-10-CM

## 2017-04-08 NOTE — Patient Instructions (Signed)

## 2017-05-29 ENCOUNTER — Other Ambulatory Visit: Payer: Self-pay | Admitting: Internal Medicine

## 2017-05-29 DIAGNOSIS — Z1231 Encounter for screening mammogram for malignant neoplasm of breast: Secondary | ICD-10-CM

## 2017-07-10 ENCOUNTER — Ambulatory Visit
Admission: RE | Admit: 2017-07-10 | Discharge: 2017-07-10 | Disposition: A | Discharge status: Home / Self Care | Payer: Medicare Other | Source: Ambulatory Visit | Attending: Internal Medicine | Admitting: Internal Medicine

## 2017-07-10 DIAGNOSIS — Z1231 Encounter for screening mammogram for malignant neoplasm of breast: Secondary | ICD-10-CM

## 2017-11-13 IMAGING — MG MM SCREEN MAMMOGRAM BILATERAL
5 series · 5 of 5 positions shown · non-contrast
Comparison: Previous exam(s).

CLINICAL DATA: Screening.

EXAM:
DIGITAL SCREENING BILATERAL MAMMOGRAM WITH CAD

[L MLO]
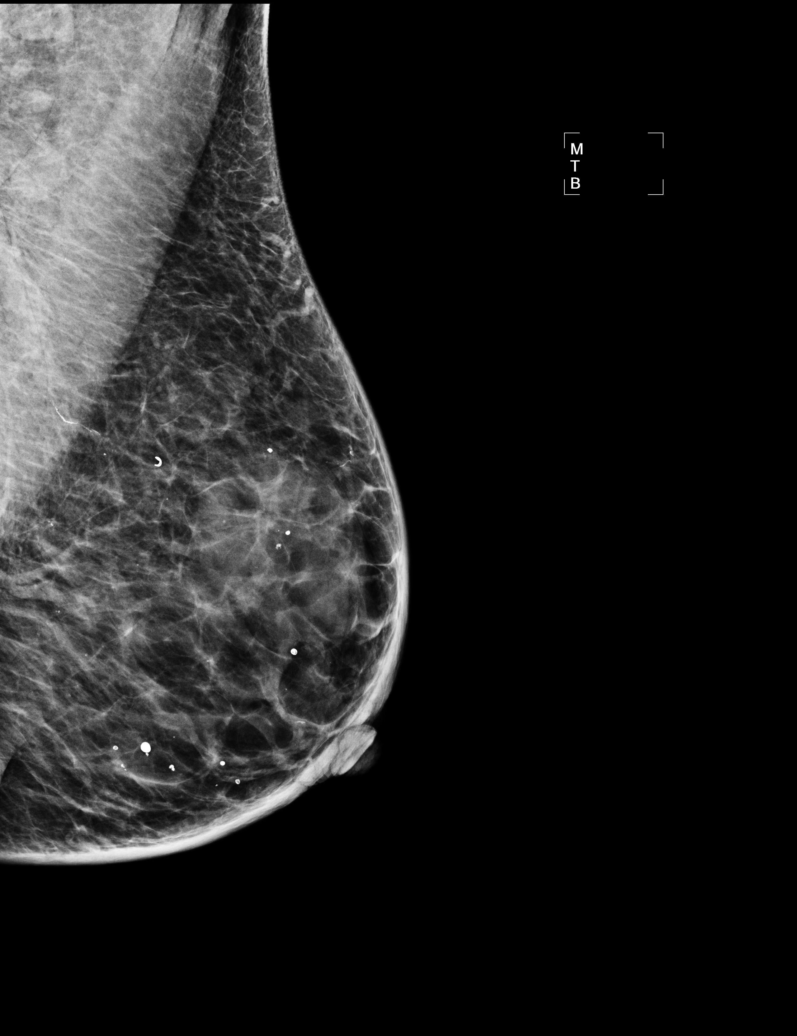

[R MLO (1 of 2)]
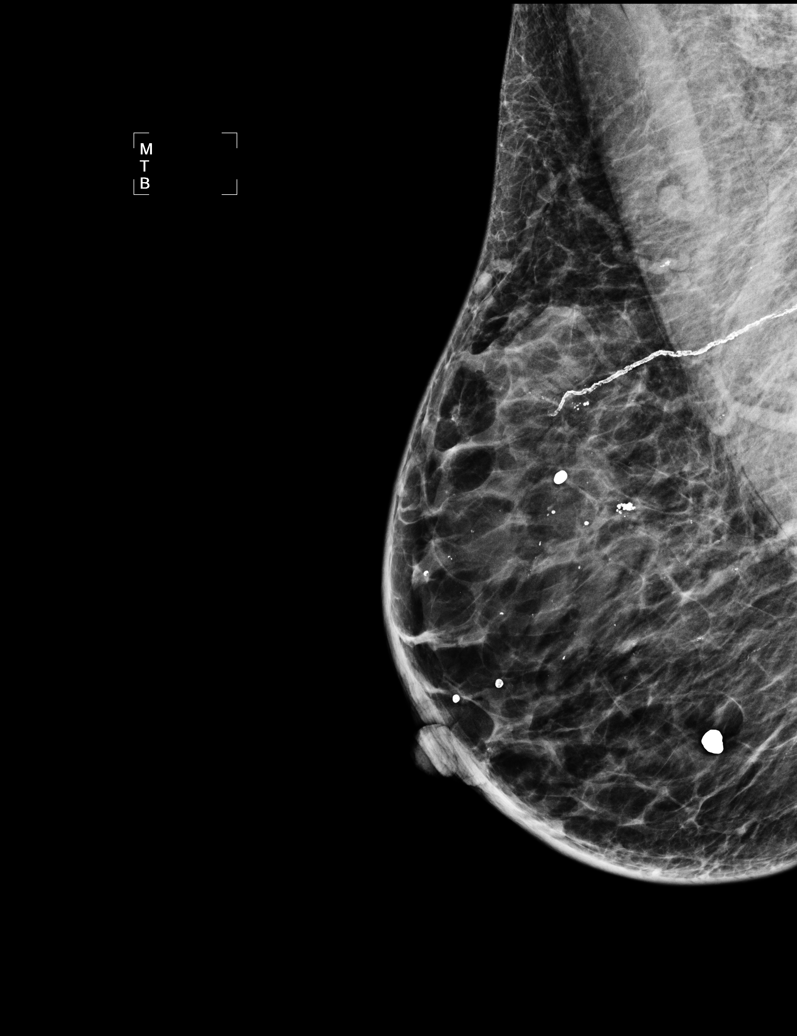

[R CC]
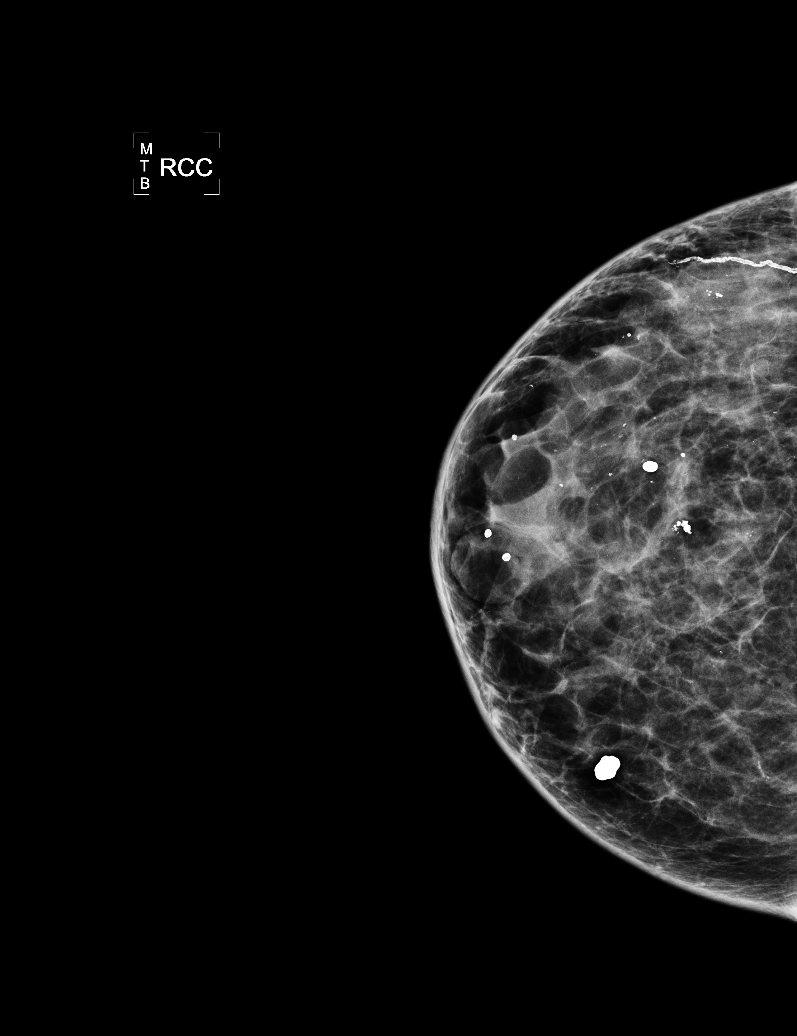

[L CC]
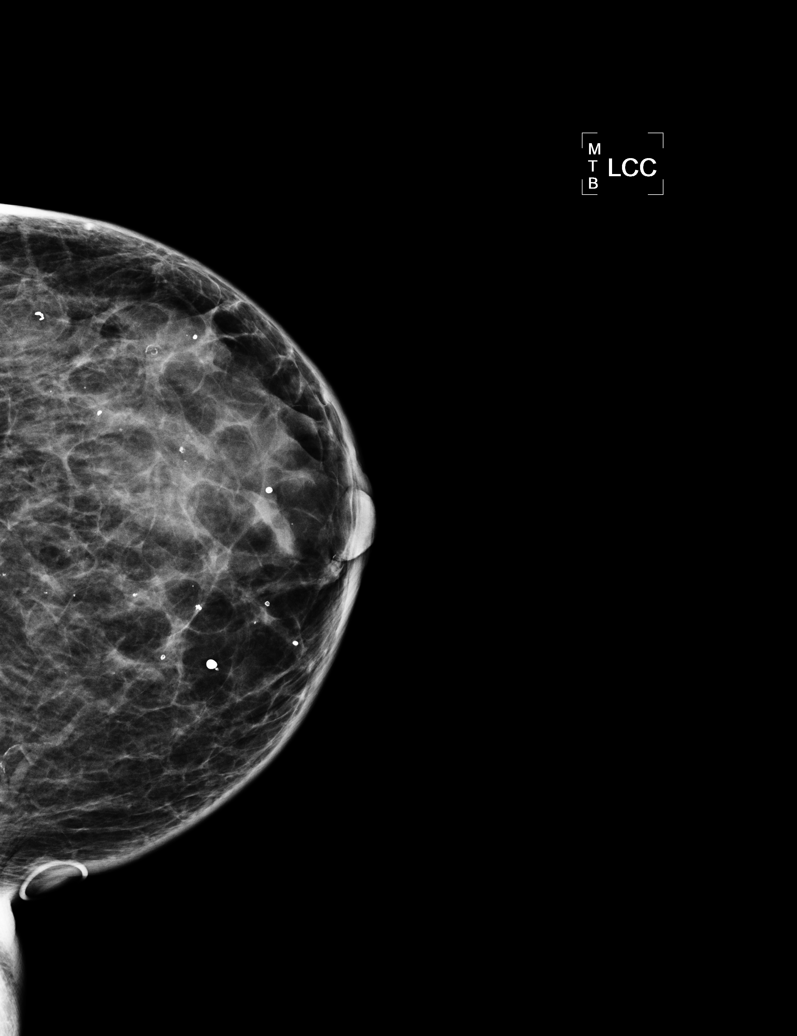

[R MLO (2 of 2)]
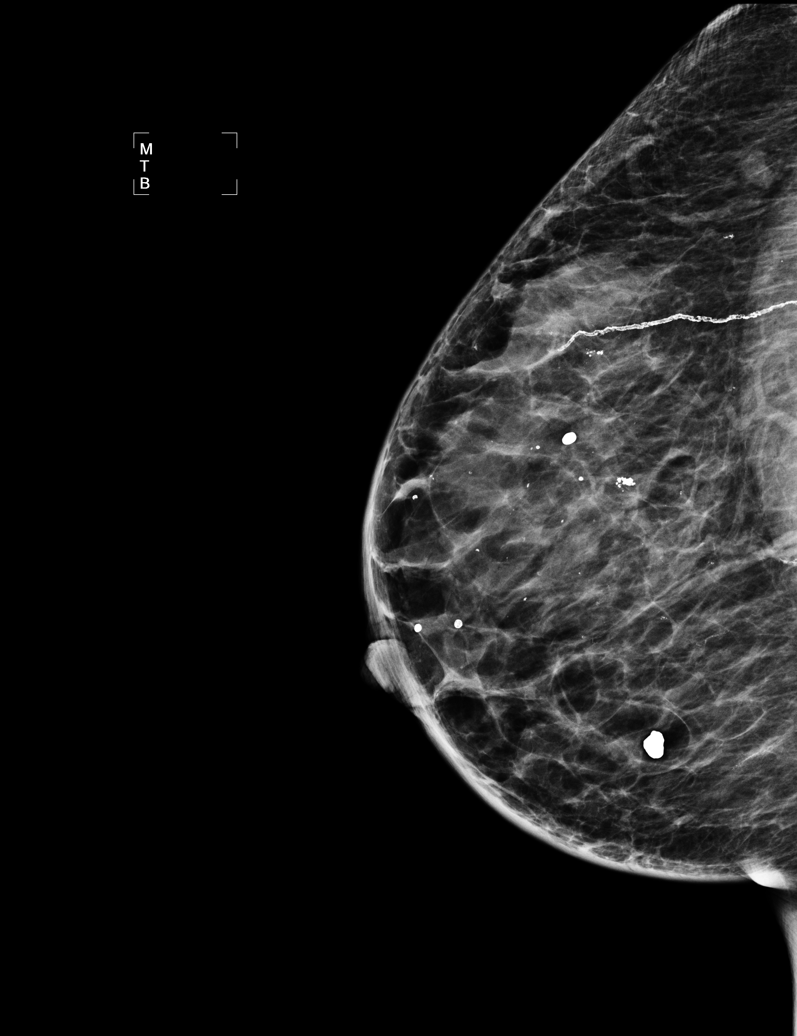

[5 of 5 positions shown; findings below may reference images not displayed]

ACR Breast Density Category c: The breast tissue is heterogeneously
dense, which may obscure small masses.
FINDINGS: There are no findings suspicious for malignancy. Images were
processed with CAD.
IMPRESSION: No mammographic evidence of malignancy. A result letter of this
screening mammogram will be mailed directly to the patient.

RECOMMENDATION:
Screening mammogram in one year. (Code:[id number])

BI-RADS CATEGORY  1: Negative.

## 2017-11-23 ENCOUNTER — Encounter (HOSPITAL_COMMUNITY): Payer: Self-pay | Admitting: Emergency Medicine

## 2017-11-23 ENCOUNTER — Other Ambulatory Visit: Payer: Self-pay

## 2017-11-23 ENCOUNTER — Inpatient Hospital Stay (HOSPITAL_COMMUNITY)
Admission: EM | Admit: 2017-11-23 | Discharge: 2017-11-24 | DRG: 291 | Disposition: A | Discharge status: Home / Self Care | Payer: Medicare Other | Attending: Family Medicine | Admitting: Family Medicine

## 2017-11-23 ENCOUNTER — Emergency Department (HOSPITAL_COMMUNITY): Payer: Medicare Other

## 2017-11-23 DIAGNOSIS — D72829 Elevated white blood cell count, unspecified: Secondary | ICD-10-CM | POA: Diagnosis not present

## 2017-11-23 DIAGNOSIS — Z955 Presence of coronary angioplasty implant and graft: Secondary | ICD-10-CM | POA: Diagnosis not present

## 2017-11-23 DIAGNOSIS — N186 End stage renal disease: Secondary | ICD-10-CM | POA: Diagnosis not present

## 2017-11-23 DIAGNOSIS — I2510 Atherosclerotic heart disease of native coronary artery without angina pectoris: Secondary | ICD-10-CM | POA: Diagnosis not present

## 2017-11-23 DIAGNOSIS — Z992 Dependence on renal dialysis: Secondary | ICD-10-CM

## 2017-11-23 DIAGNOSIS — N189 Chronic kidney disease, unspecified: Secondary | ICD-10-CM | POA: Diagnosis present

## 2017-11-23 DIAGNOSIS — E1122 Type 2 diabetes mellitus with diabetic chronic kidney disease: Secondary | ICD-10-CM | POA: Diagnosis not present

## 2017-11-23 DIAGNOSIS — R778 Other specified abnormalities of plasma proteins: Secondary | ICD-10-CM

## 2017-11-23 DIAGNOSIS — Z9049 Acquired absence of other specified parts of digestive tract: Secondary | ICD-10-CM | POA: Diagnosis not present

## 2017-11-23 DIAGNOSIS — Z833 Family history of diabetes mellitus: Secondary | ICD-10-CM

## 2017-11-23 DIAGNOSIS — J438 Other emphysema: Secondary | ICD-10-CM | POA: Diagnosis not present

## 2017-11-23 DIAGNOSIS — E1129 Type 2 diabetes mellitus with other diabetic kidney complication: Secondary | ICD-10-CM

## 2017-11-23 DIAGNOSIS — Z87891 Personal history of nicotine dependence: Secondary | ICD-10-CM | POA: Diagnosis not present

## 2017-11-23 DIAGNOSIS — E119 Type 2 diabetes mellitus without complications: Secondary | ICD-10-CM

## 2017-11-23 DIAGNOSIS — I5022 Chronic systolic (congestive) heart failure: Secondary | ICD-10-CM | POA: Diagnosis not present

## 2017-11-23 DIAGNOSIS — Z8249 Family history of ischemic heart disease and other diseases of the circulatory system: Secondary | ICD-10-CM

## 2017-11-23 DIAGNOSIS — Z803 Family history of malignant neoplasm of breast: Secondary | ICD-10-CM

## 2017-11-23 DIAGNOSIS — D649 Anemia, unspecified: Secondary | ICD-10-CM | POA: Diagnosis present

## 2017-11-23 DIAGNOSIS — Z9851 Tubal ligation status: Secondary | ICD-10-CM

## 2017-11-23 DIAGNOSIS — I132 Hypertensive heart and chronic kidney disease with heart failure and with stage 5 chronic kidney disease, or end stage renal disease: Principal | ICD-10-CM | POA: Diagnosis present

## 2017-11-23 DIAGNOSIS — R748 Abnormal levels of other serum enzymes: Secondary | ICD-10-CM

## 2017-11-23 DIAGNOSIS — J449 Chronic obstructive pulmonary disease, unspecified: Secondary | ICD-10-CM | POA: Diagnosis present

## 2017-11-23 DIAGNOSIS — Z90710 Acquired absence of both cervix and uterus: Secondary | ICD-10-CM | POA: Diagnosis not present

## 2017-11-23 DIAGNOSIS — I10 Essential (primary) hypertension: Secondary | ICD-10-CM | POA: Diagnosis not present

## 2017-11-23 DIAGNOSIS — Z794 Long term (current) use of insulin: Secondary | ICD-10-CM

## 2017-11-23 DIAGNOSIS — D631 Anemia in chronic kidney disease: Secondary | ICD-10-CM | POA: Diagnosis not present

## 2017-11-23 DIAGNOSIS — Z7982 Long term (current) use of aspirin: Secondary | ICD-10-CM | POA: Diagnosis not present

## 2017-11-23 DIAGNOSIS — F419 Anxiety disorder, unspecified: Secondary | ICD-10-CM | POA: Diagnosis not present

## 2017-11-23 DIAGNOSIS — R7989 Other specified abnormal findings of blood chemistry: Secondary | ICD-10-CM

## 2017-11-23 DIAGNOSIS — J441 Chronic obstructive pulmonary disease with (acute) exacerbation: Secondary | ICD-10-CM | POA: Diagnosis present

## 2017-11-23 LAB — CBC
HCT: 23.6 % — ABNORMAL LOW (ref 36.0–46.0)
Hemoglobin: 7.4 g/dL — ABNORMAL LOW (ref 12.0–15.0)
MCH: 30.1 pg (ref 26.0–34.0)
MCHC: 31.4 g/dL (ref 30.0–36.0)
MCV: 95.9 fL (ref 78.0–100.0)
PLATELETS: 231 10*3/uL (ref 150–400)
RBC: 2.46 MIL/uL — AB (ref 3.87–5.11)
RDW: 15.6 % — AB (ref 11.5–15.5)
WBC: 11.4 10*3/uL — ABNORMAL HIGH (ref 4.0–10.5)

## 2017-11-23 LAB — COMPREHENSIVE METABOLIC PANEL
ALBUMIN: 3.9 g/dL (ref 3.5–5.0)
ALT: 83 U/L — ABNORMAL HIGH (ref 0–44)
AST: 51 U/L — ABNORMAL HIGH (ref 15–41)
Alkaline Phosphatase: 155 U/L — ABNORMAL HIGH (ref 38–126)
Anion gap: 12 (ref 5–15)
BUN: 51 mg/dL — ABNORMAL HIGH (ref 8–23)
CALCIUM: 9.5 mg/dL (ref 8.9–10.3)
CHLORIDE: 92 mmol/L — AB (ref 98–111)
CO2: 31 mmol/L (ref 22–32)
Creatinine, Ser: 6.19 mg/dL — ABNORMAL HIGH (ref 0.44–1.00)
GFR calc non Af Amer: 6 mL/min — ABNORMAL LOW (ref >60)
GFR, EST AFRICAN AMERICAN: 7 mL/min — AB (ref >60)
GLUCOSE: 228 mg/dL — AB (ref 70–99)
POTASSIUM: 3.8 mmol/L (ref 3.5–5.1)
Sodium: 135 mmol/L (ref 135–145)
Total Bilirubin: 0.6 mg/dL (ref 0.3–1.2)
Total Protein: 6.6 g/dL (ref 6.5–8.1)

## 2017-11-23 LAB — TROPONIN I: TROPONIN I: 0.07 ng/mL — AB (ref <0.03)

## 2017-11-23 LAB — POC OCCULT BLOOD, ED: Fecal Occult Bld: NEGATIVE

## 2017-11-23 LAB — PREPARE RBC (CROSSMATCH)

## 2017-11-23 LAB — LIPASE, BLOOD: LIPASE: 49 U/L (ref 11–51)

## 2017-11-23 LAB — GLUCOSE, CAPILLARY: Glucose-Capillary: 283 mg/dL — ABNORMAL HIGH (ref 70–99)

## 2017-11-23 MED ORDER — ACETAMINOPHEN 325 MG PO TABS
650.0000 mg | ORAL_TABLET | Freq: Four times a day (QID) | ORAL | Status: DC | PRN
  Administered 2017-11-24: 650 mg via ORAL
  Filled 2017-11-23: qty 2

## 2017-11-23 MED ORDER — SODIUM CHLORIDE 0.9 % IV SOLN
10.0000 mL/h | Freq: Once | INTRAVENOUS | Status: AC
  Administered 2017-11-23: 10 mL/h via INTRAVENOUS

## 2017-11-23 MED ORDER — LATANOPROST 0.005 % OP SOLN
1.0000 [drp] | Freq: Every day | OPHTHALMIC | Status: DC
Start: 2017-11-23 — End: 2017-11-24
  Administered 2017-11-23: 1 [drp] via OPHTHALMIC
  Filled 2017-11-23: qty 2.5

## 2017-11-23 MED ORDER — SEVELAMER CARBONATE 800 MG PO TABS: 2400.0000 mg | ORAL_TABLET | Freq: Three times a day (TID) | ORAL | Status: DC

## 2017-11-23 MED ORDER — SEVELAMER CARBONATE 800 MG PO TABS
1600.0000 mg | ORAL_TABLET | Freq: Two times a day (BID) | ORAL | Status: DC | PRN
  Administered 2017-11-24: 1600 mg via ORAL
  Filled 2017-11-23: qty 2

## 2017-11-23 MED ORDER — ONDANSETRON HCL 4 MG/2ML IJ SOLN
4.0000 mg | Freq: Four times a day (QID) | INTRAMUSCULAR | Status: DC | PRN
  Administered 2017-11-24: 4 mg via INTRAVENOUS
  Filled 2017-11-23: qty 2

## 2017-11-23 MED ORDER — RENA-VITE PO TABS
1.0000 | ORAL_TABLET | Freq: Every day | ORAL | Status: DC
  Administered 2017-11-24: 1 via ORAL
  Filled 2017-11-23: qty 1

## 2017-11-23 MED ORDER — ONDANSETRON HCL 4 MG PO TABS
4.0000 mg | ORAL_TABLET | Freq: Four times a day (QID) | ORAL | Status: DC | PRN
  Filled 2017-11-23: qty 1

## 2017-11-23 MED ORDER — NITROGLYCERIN 0.4 MG SL SUBL: 0.4000 mg | SUBLINGUAL_TABLET | SUBLINGUAL | Status: DC | PRN

## 2017-11-23 MED ORDER — ACETAMINOPHEN 650 MG RE SUPP: 650.0000 mg | Freq: Four times a day (QID) | RECTAL | Status: DC | PRN

## 2017-11-23 MED ORDER — CHLORHEXIDINE GLUCONATE CLOTH 2 % EX PADS: 6.0000 | MEDICATED_PAD | Freq: Every day | CUTANEOUS | Status: DC

## 2017-11-23 MED ORDER — AMLODIPINE BESYLATE 10 MG PO TABS
10.0000 mg | ORAL_TABLET | Freq: Every day | ORAL | Status: DC
  Administered 2017-11-24: 10 mg via ORAL
  Filled 2017-11-23: qty 1

## 2017-11-23 MED ORDER — INSULIN ASPART 100 UNIT/ML ~~LOC~~ SOLN
0.0000 [IU] | Freq: Three times a day (TID) | SUBCUTANEOUS | Status: DC
  Administered 2017-11-24: 2 [IU] via SUBCUTANEOUS

## 2017-11-23 MED ORDER — METOPROLOL SUCCINATE ER 50 MG PO TB24
50.0000 mg | ORAL_TABLET | Freq: Every day | ORAL | Status: DC
  Administered 2017-11-23: 50 mg via ORAL
  Filled 2017-11-23: qty 1

## 2017-11-23 MED ORDER — SEVELAMER CARBONATE 800 MG PO TABS
2400.0000 mg | ORAL_TABLET | Freq: Three times a day (TID) | ORAL | Status: DC
  Administered 2017-11-23 – 2017-11-24 (×2): 2400 mg via ORAL
  Filled 2017-11-23 (×2): qty 3

## 2017-11-23 MED ORDER — ALPRAZOLAM 0.5 MG PO TABS
0.5000 mg | ORAL_TABLET | Freq: Two times a day (BID) | ORAL | Status: DC | PRN
  Administered 2017-11-23 – 2017-11-24 (×2): 0.5 mg via ORAL
  Filled 2017-11-23 (×2): qty 1

## 2017-11-23 MED ORDER — HEPARIN SODIUM (PORCINE) 5000 UNIT/ML IJ SOLN
5000.0000 [IU] | Freq: Three times a day (TID) | INTRAMUSCULAR | Status: DC
  Administered 2017-11-23: 5000 [IU] via SUBCUTANEOUS
  Filled 2017-11-23: qty 1

## 2017-11-23 MED ORDER — ASPIRIN EC 81 MG PO TBEC
81.0000 mg | DELAYED_RELEASE_TABLET | Freq: Every day | ORAL | Status: DC
  Administered 2017-11-24: 81 mg via ORAL
  Filled 2017-11-23: qty 1

## 2017-11-23 NOTE — ED Notes (Signed)
Restrictions band placed on pt's left wrist. 

## 2017-11-23 NOTE — Progress Notes (Signed)
Pamela [name] [id number] Admission Data: 11/23/2017 8:12 PM Attending Provider: Zannie Cove, MD  DIY:MEBRAX, Jerelyn Scott, MD Consults/ Treatment Team:   Pamela [name] is a 67 y.o. female patient admitted from ED awake, alert  & orientated  X 3,  Full Code, VSS - Blood pressure (!) 175/71, pulse (!) 105, temperature 98.9 F (37.2 C), temperature source Oral, resp. rate (!) 21, height 5\' 1"  (1.549 m), weight 54.4 kg (120 lb), SpO2 98 %., O2    2 L nasal cannular, no c/o shortness of breath, no c/o chest pain, no distress noted. Tele # 15 placed and pt is currently running:normal sinus rhythm.   IV site WDL:  antecubital right, condition patent and no redness with a transparent dsg that's clean dry and intact.  Allergies:  No Known Allergies   Past Medical History:  Diagnosis Date  . Anemia   . Anxiety   . CHF (congestive heart failure) (HCC)   . Chronic kidney disease   . COPD (chronic obstructive pulmonary disease) (HCC)   . Coronary artery disease   . Diabetes mellitus without complication (HCC) 10-29-12  . Headache   . Hypertension   . PONV (postoperative nausea and vomiting)   . Shortness of breath dyspnea     History:  obtained from chart review. Tobacco/alcohol: denied none  Pt orientation to unit, room and routine. Information packet given to patient/family and safety video watched.  Admission INP armband ID verified with patient/family, and in place. SR up x 2, fall risk assessment complete with Patient and family verbalizing understanding of risks associated with falls. Pt verbalizes an understanding of how to use the call bell and to call for help before getting out of bed.  Skin, clean-dry- intact without evidence of bruising, or skin tears.   No evidence of skin break down noted on exam. no rashes, no ecchymoses, no petechiae, no nodules, no jaundice, no purpura, no wounds. Pt receiving unit of blood when arrived on the floor.     Will cont to monitor and assist as  needed.  Camillo Flaming, RN 11/23/2017 8:12 PM

## 2017-11-23 NOTE — ED Notes (Signed)
assisted pt to restroom with difficulties back to room placed on tele.

## 2017-11-23 NOTE — Consult Note (Addendum)
Renal Service Consult Note [facility]  Pamela [name] 11/23/2017 [name] Requesting Physician:  Dr Jomarie Longs  Reason for Consult:  ESRD pt  HPI: The patient is a 67 y.o. year-old w/ hx of CHF, COPD, ESRD on HD, anxiety, anemia, DM2, HTN who presented to ED w/ nausea and gen'd weakness x 3 days.  Has not missed any HD, was out of town on vacation last week but got 3 sessions nonetheless through the week.  In ED Hb was down at 7.4, per pt is usually about 12 when checked at HD unit.  Admitted for transfusion, had one unit prbc's today.  Hemoccult on stool in ED was negative. CXR clear and trop negative. Asked to see for ESRD.    Pt has hx of CAD w/ stent in 2002 to LAD. Started HD in 2016.  Negative stress test in 2017 and another neg test last year as part of transplant w/u.  Last HD was Friday.  Hx systolic CHF w/ EF 35-40%.   No hx of GIB's or ulcers.   Main c/o's are fatigue and nausea, no abd pain, no diarrhea or melena or hematemesis, no hx GIB.    Pt was receiving 50 ug Mircera then ^'d to 100 ug dose given on 09/17/17, then Hb went up to > 12 and mircera was dc'd.  Then Hb on 6/19 was 11.7, on 7/3 was 9.7 and on 7/10 was down to 8.1 and Mircera was restarted w/ 75 ug given on 7/10.  Tsat's have all been > 30%.     ROS  denies CP  no joint pain   no HA  no blurry vision  no rash  no diarrhea    Past Medical History  Past Medical History:  Diagnosis Date  . Anemia   . Anxiety   . CHF (congestive heart failure) (HCC)   . Chronic kidney disease   . COPD (chronic obstructive pulmonary disease) (HCC)   . Coronary artery disease   . Diabetes mellitus without complication (HCC) 10-29-12  . Headache   . Hypertension   . PONV (postoperative nausea and vomiting)   . Shortness of breath dyspnea    Past Surgical History  Past Surgical History:  Procedure Laterality Date  . ABDOMINAL HYSTERECTOMY    . ANAL FISSURE REPAIR    . APPENDECTOMY     '74- open  with gallbladder  . AV FISTULA PLACEMENT Left 01/31/2015   Procedure: ARTERIOVENOUS FISTULA CREATION-LEFT BRACHIO-CEPHALIC ;  Surgeon: Fransisco Hertz, MD;  Location: [facility] OR;  Service: Vascular;  Laterality: Left;  . AV FISTULA PLACEMENT Left 04/04/2015   Procedure: INSERTION OF ARTERIOVENOUS (AV) GORE-TEX GRAFT ARM;  Surgeon: Sherren Kerns, MD;  Location: [facility] OR;  Service: Vascular;  Laterality: Left;  . BREAST EXCISIONAL BIOPSY Left   . BREAST SURGERY     cyst removed  . CARDIAC CATHETERIZATION     1 coronary stent placed  . CHOLECYSTECTOMY     '74-open  . COLONOSCOPY WITH PROPOFOL N/A 11/17/2012   Procedure: COLONOSCOPY WITH PROPOFOL;  Surgeon: Charolett Bumpers, MD;  Location: [facility] ENDOSCOPY;  Service: Endoscopy;  Laterality: N/A;  . CORONARY STENT PLACEMENT    . ELBOW SURGERY Right    tendon surgery  . ESOPHAGOGASTRODUODENOSCOPY (EGD) WITH PROPOFOL N/A 11/17/2012   Procedure: ESOPHAGOGASTRODUODENOSCOPY (EGD) WITH PROPOFOL;  Surgeon: Charolett Bumpers, MD;  Location: [facility] ENDOSCOPY;  Service: Endoscopy;  Laterality: N/A;  . GANGLION CYST EXCISION Bilateral 10-29-12   wrist  .  LEFT HEART CATHETERIZATION WITH CORONARY ANGIOGRAM N/A 04/14/2014   Procedure: LEFT HEART CATHETERIZATION WITH CORONARY ANGIOGRAM;  Surgeon: Lesleigh Noe, MD;  Location: [facility] CATH LAB;  Service: Cardiovascular;  Laterality: N/A;  . PERIPHERAL VASCULAR CATHETERIZATION N/A 03/09/2015   Procedure: Fistulagram;  Surgeon: Fransisco Hertz, MD;  Location[facility] INVASIVE CV LAB;  Service: Cardiovascular;  Laterality: N/A;  . TEE WITHOUT CARDIOVERSION N/A 01/20/2015   Procedure: TRANSESOPHAGEAL ECHOCARDIOGRAM (TEE);  Surgeon: Pricilla Riffle, MD;  Location: [facility] ENDOSCOPY;  Service: Cardiovascular;  Laterality: N/A;  . TUBAL LIGATION     Family History  Family History  Problem Relation Age of Onset  . Diabetes Father   . Cancer Father   . Hypertension Father   . Heart attack Mother   . Hypertension Mother   . Diabetes Sister   .  Hypertension Sister   . Cancer Sister   . Hypertension Sister   . Breast cancer Sister    Social History  reports that she quit smoking about 25 years ago. Her smoking use included cigarettes. She smoked 1.50 packs per day. She has never used smokeless tobacco. She reports that she does not drink alcohol or use drugs. Allergies No Known Allergies Home medications Prior to Admission medications   Medication Sig Start Date End Date Taking? Authorizing Provider  ALPRAZolam Prudy Feeler) 0.5 MG tablet Take 1 tablet (0.5 mg total) by mouth at bedtime. Patient taking differently: Take 0.5 mg by mouth 2 (two) times daily.  02/01/15  Yes Standley Brooking, MD  amLODipine (NORVASC) 10 MG tablet Take 10 mg by mouth daily. 03/04/17  Yes [provider]  aspirin EC 81 MG tablet Take 81 mg by mouth daily.   Yes [provider]  Insulin Glargine (LANTUS SOLOSTAR) 100 UNIT/ML Solostar Pen Inject 5 units into the skin twice daily.   Yes [provider]  latanoprost (XALATAN) 0.005 % ophthalmic solution Place 1 drop into both eyes at bedtime.   Yes [provider]  lidocaine-prilocaine (EMLA) cream Apply 1 application topically daily as needed. 11/04/17  Yes [provider]  metoprolol succinate (TOPROL-XL) 50 MG 24 hr tablet Take 50 mg by mouth at bedtime.  01/12/17  Yes [provider]  multivitamin (RENA-VIT) TABS tablet Take 1 tablet by mouth daily. 02/02/15  Yes [provider]  nitroGLYCERIN (NITROSTAT) 0.4 MG SL tablet Place 0.4 mg under the tongue every 5 (five) minutes as needed for chest pain.   Yes [provider]  Nutritional Supplements (FEEDING SUPPLEMENT, NEPRO CARB STEADY,) LIQD Take 237 mLs by mouth daily. Patient taking differently: Take 118 mLs by mouth daily.  02/01/15  Yes Standley Brooking, MD  sevelamer carbonate (RENVELA) 800 MG tablet Take 1,600-2,400 mg by mouth See admin instructions. Take 2400 mg with meals and 1600 mg  with snacks   Yes [provider]   Liver Function Tests Recent Labs  Lab 11/23/17 1014  AST 51*  ALT 83*  ALKPHOS 155*  BILITOT 0.6  PROT 6.6  ALBUMIN 3.9   Recent Labs  Lab 11/23/17 1014  LIPASE 49   CBC Recent Labs  Lab 11/23/17 1014  WBC 11.4*  HGB 7.4*  HCT 23.6*  MCV 95.9  PLT 231   Basic Metabolic Panel Recent Labs  Lab 11/23/17 1014  NA 135  K 3.8  CL 92*  CO2 31  GLUCOSE 228*  BUN 51*  CREATININE 6.19*  CALCIUM 9.5   Iron/TIBC/Ferritin/ %Sat    Component  Value Date/Time   IRON 30 01/30/2015 1230   TIBC 214 (L) 01/30/2015 1230   FERRITIN 259 01/30/2015 1230   IRONPCTSAT 14 01/30/2015 1230    Vitals:   11/23/17 1800 11/23/17 1815 11/23/17 1852 11/23/17 2130  BP: (!) 162/80 (!) 173/63 (!) 175/71 (!) 177/66  Pulse: (!) 104 (!) 105 (!) 105 (!) 105  Resp: (!) 24 (!) 27 (!) 21 16  Temp:   98.9 F (37.2 C) 97.8 F (36.6 C)  TempSrc:   Oral Oral  SpO2: 98% 97% 98% 95%  Weight:      Height:       Exam Gen alert no distress No rash, cyanosis or gangrene Sclera anicteric, throat clear  No jvd or bruits Chest dec'd BS bilat and some scattered coarse wheezing RRR no MRG Abd soft ntnd no mass or ascites +bs GU defer MS no joint effusions or deformity Ext no LE or UE edema, no wounds or ulcers Neuro is alert, Ox 3 , nf  LUA AVG+ bruit, small bruising lateral to AVG    Home meds:  - norvasc 10/ toprol xl 50 hs  - lantus 5 u bid  - xanax 0.5 hs  - sl ntg prn/ renvela ac/ ecasa 81  - eyedrops   Dialysis: MWF [facility]  4h  54.5kg  P2  Hep 3500   LUA AVG  - mircera restarted on 7/10 at 75 ug   Impression: 1  Anemia - guiac neg here, Hb's dropped after mircera stopped , had last dose given in early May.  Suspect this is mostly esa related and will respond to resuming esa which was done on 7/10.  Tsat's have been > 30%.  Agree w/ prbc's x 2 here; the 7/10 esa dose will kick in within 1-2 weeks of administration. Plan 2nd prbc w/ HD  tomorrow.  2  Fatigue - has underlying COPD which may be exacerbating her symptoms 3  ESRD on HD MWF. HD in am.  4  COPD - not on home O2 5  HTN - cont meds 6  DM 2- per primary 7  Volume - BP's up , at dry wt, will try lower edw slightly w HD tomorrow   Plan - as above  Vinson Moselle MD [facility] pager [phone]   11/23/2017, 10:18 PM

## 2017-11-23 NOTE — ED Triage Notes (Addendum)
Pt reports dialysis on Friday, states she became very nauseated and very weak feeling there. Pt denies sob or chest pain, states that she has COPD and feels like her breathing is at baseline. Had to alter dialysis schedule last week due to vacation and states she has not felt well since. A/ox4, resp e/u

## 2017-11-23 NOTE — ED Provider Notes (Addendum)
[facility] EMERGENCY DEPARTMENT Provider Note   CSN: [id number] Arrival date & time: 11/23/17  9147     History   Chief Complaint Chief Complaint  Patient presents with  . Nausea    HPI Pamela [name] is a 67 y.o. female.  HPI Pt states she started to feel bad on Thursday.  She began having trouble with shortness of breath.  She went to dialysis on Friday.  While she had dialysis she was feeling short of breath and she had to get oxygen.  SHe did not feel better but on Saturday it started to improve.  This morning she felt worse again.  Whenever she tries to walk or move around she gets short of breath.  She feels like her heart is racing.  She gets fatigued.  No fever.   No leg swelling.  No blood in her stool. She is not sure of her exact blood count but the other day it was "9 something" Past Medical History:  Diagnosis Date  . Anemia   . Anxiety   . CHF (congestive heart failure) (HCC)   . Chronic kidney disease   . COPD (chronic obstructive pulmonary disease) (HCC)   . Coronary artery disease   . Diabetes mellitus without complication (HCC) 10-29-12  . Headache   . Hypertension   . PONV (postoperative nausea and vomiting)   . Shortness of breath dyspnea     Patient Active Problem List   Diagnosis Date Noted  . DM (diabetes mellitus) (HCC) 02/01/2015  . ESRD on dialysis (HCC) 01/25/2015  . Hyponatremia 01/25/2015  . Pressure ulcer 01/24/2015  . Chronic systolic heart failure (HCC)   . Edema   . COPD (chronic obstructive pulmonary disease) (HCC)   . Septic shock (HCC) 01/14/2015  . Hyperkalemia 01/14/2015  . Acute on chronic renal failure (HCC) 01/14/2015  . COPD, severe (HCC) 03/25/2014  . Essential hypertension 03/25/2014  . Hyperlipidemia 03/25/2014  . Diabetes mellitus due to underlying condition with circulatory complication (HCC) 03/25/2014  . Anemia, iron deficiency 03/25/2014  . CAD in native artery 03/25/2014    Past Surgical  History:  Procedure Laterality Date  . ABDOMINAL HYSTERECTOMY    . ANAL FISSURE REPAIR    . APPENDECTOMY     '74- open with gallbladder  . AV FISTULA PLACEMENT Left 01/31/2015   Procedure: ARTERIOVENOUS FISTULA CREATION-LEFT BRACHIO-CEPHALIC ;  Surgeon: Fransisco Hertz, MD;  Location: [facility] OR;  Service: Vascular;  Laterality: Left;  . AV FISTULA PLACEMENT Left 04/04/2015   Procedure: INSERTION OF ARTERIOVENOUS (AV) GORE-TEX GRAFT ARM;  Surgeon: Sherren Kerns, MD;  Location: [facility] OR;  Service: Vascular;  Laterality: Left;  . BREAST EXCISIONAL BIOPSY Left   . BREAST SURGERY     cyst removed  . CARDIAC CATHETERIZATION     1 coronary stent placed  . CHOLECYSTECTOMY     '74-open  . COLONOSCOPY WITH PROPOFOL N/A 11/17/2012   Procedure: COLONOSCOPY WITH PROPOFOL;  Surgeon: Charolett Bumpers, MD;  Location: [facility] ENDOSCOPY;  Service: Endoscopy;  Laterality: N/A;  . CORONARY STENT PLACEMENT    . ELBOW SURGERY Right    tendon surgery  . ESOPHAGOGASTRODUODENOSCOPY (EGD) WITH PROPOFOL N/A 11/17/2012   Procedure: ESOPHAGOGASTRODUODENOSCOPY (EGD) WITH PROPOFOL;  Surgeon: Charolett Bumpers, MD;  Location: [facility] ENDOSCOPY;  Service: Endoscopy;  Laterality: N/A;  . GANGLION CYST EXCISION Bilateral 10-29-12   wrist  . LEFT HEART CATHETERIZATION WITH CORONARY ANGIOGRAM N/A 04/14/2014   Procedure: LEFT HEART CATHETERIZATION WITH  CORONARY ANGIOGRAM;  Surgeon: Lesleigh Noe, MD;  Location: [facility] CATH LAB;  Service: Cardiovascular;  Laterality: N/A;  . PERIPHERAL VASCULAR CATHETERIZATION N/A 03/09/2015   Procedure: Fistulagram;  Surgeon: Fransisco Hertz, MD;  Location: [facility] INVASIVE CV LAB;  Service: Cardiovascular;  Laterality: N/A;  . TEE WITHOUT CARDIOVERSION N/A 01/20/2015   Procedure: TRANSESOPHAGEAL ECHOCARDIOGRAM (TEE);  Surgeon: Pricilla Riffle, MD;  Location: [facility] ENDOSCOPY;  Service: Cardiovascular;  Laterality: N/A;  . TUBAL LIGATION       OB History   None      Home Medications    Prior to Admission medications     Medication Sig Start Date End Date Taking? Authorizing Provider  ALPRAZolam Prudy Feeler) 0.5 MG tablet Take 1 tablet (0.5 mg total) by mouth at bedtime. Patient taking differently: Take 0.5 mg by mouth 2 (two) times daily.  02/01/15  Yes Standley Brooking, MD  amLODipine (NORVASC) 10 MG tablet Take 10 mg by mouth daily. 03/04/17  Yes [provider]  aspirin EC 81 MG tablet Take 81 mg by mouth daily.   Yes [provider]  Insulin Glargine (LANTUS SOLOSTAR) 100 UNIT/ML Solostar Pen Inject 5 units into the skin twice daily.   Yes [provider]  latanoprost (XALATAN) 0.005 % ophthalmic solution Place 1 drop into both eyes at bedtime.   Yes [provider]  lidocaine-prilocaine (EMLA) cream Apply 1 application topically daily as needed. 11/04/17  Yes [provider]  metoprolol succinate (TOPROL-XL) 50 MG 24 hr tablet Take 50 mg by mouth at bedtime.  01/12/17  Yes [provider]  multivitamin (RENA-VIT) TABS tablet Take 1 tablet by mouth daily. 02/02/15  Yes [provider]  nitroGLYCERIN (NITROSTAT) 0.4 MG SL tablet Place 0.4 mg under the tongue every 5 (five) minutes as needed for chest pain.   Yes [provider]  Nutritional Supplements (FEEDING SUPPLEMENT, NEPRO CARB STEADY,) LIQD Take 237 mLs by mouth daily. Patient taking differently: Take 118 mLs by mouth daily.  02/01/15  Yes Standley Brooking, MD  sevelamer carbonate (RENVELA) 800 MG tablet Take 1,600-2,400 mg by mouth See admin instructions. Take 2400 mg with meals and 1600 mg with snacks   Yes [provider]    Family History Family History  Problem Relation Age of Onset  . Diabetes Father   . Cancer Father   . Hypertension Father   . Heart attack Mother   . Hypertension Mother   . Diabetes Sister   . Hypertension Sister   . Cancer Sister   . Hypertension Sister   . Breast cancer Sister     Social History Social History   Tobacco Use  . Smoking  status: Former Smoker    Packs/day: 1.50    Types: Cigarettes    Last attempt to quit: 10/29/1992    Years since quitting: 25.0  . Smokeless tobacco: Never Used  Substance Use Topics  . Alcohol use: No    Alcohol/week: 0.0 oz  . Drug use: No     Allergies   Patient has no known allergies.   Review of Systems Review of Systems  All other systems reviewed and are negative.    Physical Exam Updated Vital Signs BP (!) 151/55   Pulse 87   Temp 98.3 F (36.8 C) (Oral)   Resp 19   Ht 1.549 m (5\' 1" )   Wt 54.4 kg (120 lb)   SpO2 100%   BMI 22.67 kg/m   Physical Exam  Constitutional: No distress.  frail  HENT:  Head: Normocephalic and atraumatic.  Right Ear: External ear normal.  Left Ear: External ear normal.  Eyes: Conjunctivae are normal. Right eye exhibits no discharge. Left eye exhibits no discharge. No scleral icterus.  Neck: Neck supple. No tracheal deviation present.  Cardiovascular: Normal rate, regular rhythm and intact distal pulses.  Pulmonary/Chest: Effort normal and breath sounds normal. No stridor. No respiratory distress. She has no wheezes. She has no rales.  Abdominal: Soft. Bowel sounds are normal. She exhibits no distension. There is no tenderness. There is no rebound and no guarding.  Genitourinary:  Genitourinary Comments: No gross blood on rectal exam  Musculoskeletal: She exhibits no edema or tenderness.  Neurological: She is alert. She has normal strength. No cranial nerve deficit (no facial droop, extraocular movements intact, no slurred speech) or sensory deficit. She exhibits normal muscle tone. She displays no seizure activity. Coordination normal.  Skin: Skin is warm and dry. No rash noted.  Psychiatric: She has a normal mood and affect.  Nursing note and vitals reviewed.    ED Treatments / Results  Labs (all labs ordered are listed, but only abnormal results are displayed) Labs Reviewed  COMPREHENSIVE METABOLIC PANEL - Abnormal;  Notable for the following components:      Result Value   Chloride 92 (*)    Glucose, Bld 228 (*)    BUN 51 (*)    Creatinine, Ser 6.19 (*)    AST 51 (*)    ALT 83 (*)    Alkaline Phosphatase 155 (*)    GFR calc non Af Amer 6 (*)    GFR calc Af Amer 7 (*)    All other components within normal limits  CBC - Abnormal; Notable for the following components:   WBC 11.4 (*)    RBC 2.46 (*)    Hemoglobin 7.4 (*)    HCT 23.6 (*)    RDW 15.6 (*)    All other components within normal limits  TROPONIN I - Abnormal; Notable for the following components:   Troponin I 0.07 (*)    All other components within normal limits  LIPASE, BLOOD  POC OCCULT BLOOD, ED  TYPE AND SCREEN    EKG EKG Interpretation  Date/Time:  Sunday November 23 2017 11:55:00 EDT Ventricular Rate:  104 PR Interval:    QRS Duration: 106 QT Interval:  391 QTC Calculation: 515 R Axis:   88 Text Interpretation:  Sinus tachycardia , new since last tracing Borderline right axis deviation Probable LVH with secondary repol abnrm ST depr, consider ischemia, inferior leads , new since last tracing Anterior ST elevation, probably due to LVH Prolonged QT interval Confirmed by Linwood Dibbles [phone]) on 11/23/2017 11:58:08 AM   Radiology Dg Chest 2 View  Result Date: 11/23/2017 CLINICAL DATA:  Shortness of breath. EXAM: CHEST - 2 VIEW COMPARISON:  Chest x-ray dated January 31, 2015. FINDINGS: The heart size and mediastinal contours are within normal limits. Normal pulmonary vascularity. Atherosclerotic calcification of the aortic arch. The lungs are hyperinflated with coarsened interstitial markings and scattered pleuroparenchymal scarring. No focal consolidation, pleural effusion, or pneumothorax. No acute osseous abnormality. Left upper extremity vascular stents. IMPRESSION: 1. COPD.  No active cardiopulmonary disease. 2.  Aortic atherosclerosis (ICD10-I70.0). Electronically Signed   By: Obie Dredge M.D.   On: 11/23/2017 12:48     Procedures Procedures (including critical care time)  Medications Ordered in ED Medications  0.9 %  sodium chloride infusion (has no  administration in time range)     Initial Impression / Assessment and Plan / ED Course  I have reviewed the triage vital signs and the nursing notes.  Pertinent labs & imaging results that were available during my care of the patient were reviewed by me and considered in my medical decision making (see chart for details).  Clinical Course as of Nov 23 1628  Sun Nov 23, 2017  1551 D/w Dr Arlean Hopping.  Pt could be transfused tomorrow at dialysis but she is feeling very symptomatic.     [JK]  1551 Patient labs notable for chronic kidney disease.  Patient also has a hemoglobin of 7.4.  This appears to be new compared to previous labs although they were couple years ago.  Troponin I is also elevated.   [JK]    Clinical Course User Index [JK] Linwood Dibbles, MD    Patient presents to the emergency room for evaluation of shortness of breath, which is really dyspnea on exertion.  Patient also has been feeling nauseated and weak.  Her  laboratory tests are notable for significant anemia.  This may be contributing to her fatigue.  She is guaiac negative.  No evidence of active GI bleeding.  It is possible her anemia is related to her renal disease.  No evidence of pulmonary edema to require emergent dialysis.  Troponin is slightly elevated.  This may be related to her renal disease.  I doubt acute cardiac ischemia at this time although we need to monitor her troponin.  I will consult the medical service for admission.  I ordered a blood transfusion.  Final Clinical Impressions(s) / ED Diagnoses   Final diagnoses:  Symptomatic anemia  Elevated troponin      Linwood Dibbles, MD 11/23/17 1630 pe addendum   Linwood Dibbles, MD 12/08/17 1058

## 2017-11-23 NOTE — ED Notes (Signed)
Patient returned from XRay

## 2017-11-23 NOTE — ED Notes (Signed)
Report attempted 

## 2017-11-23 NOTE — H&P (Signed)
History and Physical    Pamela [name] ZOX:[id number] DOB: Oct 06, 1950 DOA: 11/23/2017  Referring MD/NP/PA: EDP PCP:  Patient coming from: home  Chief Complaint: weakness, nausea  HPI: Pamela [name] is a 67 y.o. female with medical history significant of ESRD on hemodialysis MWF, chronic systolic CHF, COPD, CAD with remote stent, diabetes mellitus presented to the emergency room today with progressive weakness, nausea, generalized fatigue for the last 3 days. -She denies any chest pain, vomiting, diarrhea, fevers chills or  Wheezing. -she is compliant with her hemodialysis, and [place] she was switched from a MWF to TTS schedule however did not miss any treatments. Patient reports that her dry weight was increased about a month ago, and at baseline and her hemoglobin is usually around 12 when last checked at dialysis a few months ago. -patient denies any change in the color of her stools, no hematemesis melena or hematochezia  ED Course: in the emergency room noted to have hemoglobin of 7.4 which is more than 4 g lower than baseline, Hemoccults negative 1, troponin of 0.7, chest x-ray was unremarkable  Review of Systems: As per HPI otherwise 14 point review of systems negative.   Past Medical History:  Diagnosis Date  . Anemia   . Anxiety   . CHF (congestive heart failure) (HCC)   . Chronic kidney disease   . COPD (chronic obstructive pulmonary disease) (HCC)   . Coronary artery disease   . Diabetes mellitus without complication (HCC) 10-29-12  . Headache   . Hypertension   . PONV (postoperative nausea and vomiting)   . Shortness of breath dyspnea     Past Surgical History:  Procedure Laterality Date  . ABDOMINAL HYSTERECTOMY    . ANAL FISSURE REPAIR    . APPENDECTOMY     '74- open with gallbladder  . AV FISTULA PLACEMENT Left 01/31/2015   Procedure: ARTERIOVENOUS FISTULA CREATION-LEFT BRACHIO-CEPHALIC ;  Surgeon: Fransisco Hertz, MD;   Location: [facility] OR;  Service: Vascular;  Laterality: Left;  . AV FISTULA PLACEMENT Left 04/04/2015   Procedure: INSERTION OF ARTERIOVENOUS (AV) GORE-TEX GRAFT ARM;  Surgeon: Sherren Kerns, MD;  Location: [facility] OR;  Service: Vascular;  Laterality: Left;  . BREAST EXCISIONAL BIOPSY Left   . BREAST SURGERY     cyst removed  . CARDIAC CATHETERIZATION     1 coronary stent placed  . CHOLECYSTECTOMY     '74-open  . COLONOSCOPY WITH PROPOFOL N/A 11/17/2012   Procedure: COLONOSCOPY WITH PROPOFOL;  Surgeon: Charolett Bumpers, MD;  Location: [facility] ENDOSCOPY;  Service: Endoscopy;  Laterality: N/A;  . CORONARY STENT PLACEMENT    . ELBOW SURGERY Right    tendon surgery  . ESOPHAGOGASTRODUODENOSCOPY (EGD) WITH PROPOFOL N/A 11/17/2012   Procedure: ESOPHAGOGASTRODUODENOSCOPY (EGD) WITH PROPOFOL;  Surgeon: Charolett Bumpers, MD;  Location: [facility] ENDOSCOPY;  Service: Endoscopy;  Laterality: N/A;  . GANGLION CYST EXCISION Bilateral 10-29-12   wrist  . LEFT HEART CATHETERIZATION WITH CORONARY ANGIOGRAM N/A 04/14/2014   Procedure: LEFT HEART CATHETERIZATION WITH CORONARY ANGIOGRAM;  Surgeon: Lesleigh Noe, MD;  Location: [facility] CATH LAB;  Service: Cardiovascular;  Laterality: N/A;  . PERIPHERAL VASCULAR CATHETERIZATION N/A 03/09/2015   Procedure: Fistulagram;  Surgeon: Fransisco Hertz, MD;  Location: [facility] INVASIVE CV LAB;  Service: Cardiovascular;  Laterality: N/A;  . TEE WITHOUT CARDIOVERSION N/A 01/20/2015   Procedure: TRANSESOPHAGEAL ECHOCARDIOGRAM (TEE);  Surgeon: Pricilla Riffle, MD;  Location: [facility] ENDOSCOPY;  Service: Cardiovascular;  Laterality: N/A;  . TUBAL LIGATION       reports that she quit smoking about 25 years ago. Her smoking use included cigarettes. She smoked 1.50 packs per day. She has never used smokeless tobacco. She reports that she does not drink alcohol or use drugs.  No Known Allergies  Family History  Problem Relation Age of Onset  . Diabetes Father   . Cancer Father   . Hypertension Father   . Heart  attack Mother   . Hypertension Mother   . Diabetes Sister   . Hypertension Sister   . Cancer Sister   . Hypertension Sister   . Breast cancer Sister      Prior to Admission medications   Medication Sig Start Date End Date Taking? Authorizing Provider  ALPRAZolam Prudy Feeler) 0.5 MG tablet Take 1 tablet (0.5 mg total) by mouth at bedtime. Patient taking differently: Take 0.5 mg by mouth 2 (two) times daily.  02/01/15  Yes Standley Brooking, MD  amLODipine (NORVASC) 10 MG tablet Take 10 mg by mouth daily. 03/04/17  Yes [provider]  aspirin EC 81 MG tablet Take 81 mg by mouth daily.   Yes [provider]  Insulin Glargine (LANTUS SOLOSTAR) 100 UNIT/ML Solostar Pen Inject 5 units into the skin twice daily.   Yes [provider]  latanoprost (XALATAN) 0.005 % ophthalmic solution Place 1 drop into both eyes at bedtime.   Yes [provider]  lidocaine-prilocaine (EMLA) cream Apply 1 application topically daily as needed. 11/04/17  Yes [provider]  metoprolol succinate (TOPROL-XL) 50 MG 24 hr tablet Take 50 mg by mouth at bedtime.  01/12/17  Yes [provider]  multivitamin (RENA-VIT) TABS tablet Take 1 tablet by mouth daily. 02/02/15  Yes [provider]  nitroGLYCERIN (NITROSTAT) 0.4 MG SL tablet Place 0.4 mg under the tongue every 5 (five) minutes as needed for chest pain.   Yes [provider]  Nutritional Supplements (FEEDING SUPPLEMENT, NEPRO CARB STEADY,) LIQD Take 237 mLs by mouth daily. Patient taking differently: Take 118 mLs by mouth daily.  02/01/15  Yes Standley Brooking, MD  sevelamer carbonate (RENVELA) 800 MG tablet Take 1,600-2,400 mg by mouth See admin instructions. Take 2400 mg with meals and 1600 mg with snacks   Yes [provider]    Physical Exam: Vitals:   11/23/17 1600 11/23/17 1615 11/23/17 1630 11/23/17 1722  BP: (!) 163/58 (!) 163/62 (!) 171/61 (!) 186/58  Pulse: 93 89 (!) 102 (!) 109    Resp: 16 19 (!) 23 18  Temp:    98.2 F (36.8 C)  TempSrc:    Oral  SpO2: 100% 100% 99%   Weight:      Height:          Constitutional: NAD, calm, comfortable Vitals:   11/23/17 1600 11/23/17 1615 11/23/17 1630 11/23/17 1722  BP: (!) 163/58 (!) 163/62 (!) 171/61 (!) 186/58  Pulse: 93 89 (!) 102 (!) 109  Resp: 16 19 (!) 23 18  Temp:    98.2 F (36.8 C)  TempSrc:    Oral  SpO2: 100% 100% 99%   Weight:      Height:       Eyes: PERRL, lids and conjunctivae normal ENMT: Mucous membranes are moist.  Neck: normal, supple Respiratory: clear to auscultation bilaterally, no wheezing, no crackles. Normal respiratory effort. No accessory muscle use.  Cardiovascular: Regular rate and rhythm, no murmurs / rubs / gallops Abdomen: soft, non  tender, Bowel sounds positive.  Musculoskeletal: No joint deformity upper and lower extremities. Ext:  No edema Skin: no rashes, lesions, ulcers.  Neurologic: CN 2-12 grossly intact. Sensation intact, DTR normal. Strength 5/5 in all 4.  Psychiatric: Normal judgment and insight. Alert and oriented x 3. Normal mood.   Labs on Admission: I have personally reviewed following labs and imaging studies  CBC: Recent Labs  Lab 11/23/17 1014  WBC 11.4*  HGB 7.4*  HCT 23.6*  MCV 95.9  PLT 231   Basic Metabolic Panel: Recent Labs  Lab 11/23/17 1014  NA 135  K 3.8  CL 92*  CO2 31  GLUCOSE 228*  BUN 51*  CREATININE 6.19*  CALCIUM 9.5   GFR: Estimated Creatinine Clearance: 6.7 mL/min (A) (by C-G formula based on SCr of 6.19 mg/dL (H)). Liver Function Tests: Recent Labs  Lab 11/23/17 1014  AST 51*  ALT 83*  ALKPHOS 155*  BILITOT 0.6  PROT 6.6  ALBUMIN 3.9   Recent Labs  Lab 11/23/17 1014  LIPASE 49   No results for input(s): AMMONIA in the last 168 hours. Coagulation Profile: No results for input(s): INR, PROTIME in the last 168 hours. Cardiac Enzymes: Recent Labs  Lab 11/23/17 1014  TROPONINI 0.07*   BNP (last 3  results) No results for input(s): PROBNP in the last 8760 hours. HbA1C: No results for input(s): HGBA1C in the last 72 hours. CBG: No results for input(s): GLUCAP in the last 168 hours. Lipid Profile: No results for input(s): CHOL, HDL, LDLCALC, TRIG, CHOLHDL, LDLDIRECT in the last 72 hours. Thyroid Function Tests: No results for input(s): TSH, T4TOTAL, FREET4, T3FREE, THYROIDAB in the last 72 hours. Anemia Panel: No results for input(s): VITAMINB12, FOLATE, FERRITIN, TIBC, IRON, RETICCTPCT in the last 72 hours. Urine analysis:    Component Value Date/Time   COLORURINE YELLOW 01/21/2015 0900   APPEARANCEUR CLOUDY (A) 01/21/2015 0900   LABSPEC 1.007 01/21/2015 0900   PHURINE 5.5 01/21/2015 0900   GLUCOSEU NEGATIVE 01/21/2015 0900   HGBUR MODERATE (A) 01/21/2015 0900   BILIRUBINUR NEGATIVE 01/21/2015 0900   KETONESUR NEGATIVE 01/21/2015 0900   PROTEINUR 100 (A) 01/21/2015 0900   UROBILINOGEN 0.2 01/21/2015 0900   NITRITE NEGATIVE 01/21/2015 0900   LEUKOCYTESUR TRACE (A) 01/21/2015 0900   Sepsis Labs: @LABRCNTIP (procalcitonin:4,lacticidven:4) )No results found for this or any previous visit (from the past 240 hour(s)).   Radiological Exams on Admission: Dg Chest 2 View  Result Date: 11/23/2017 CLINICAL DATA:  Shortness of breath. EXAM: CHEST - 2 VIEW COMPARISON:  Chest x-ray dated January 31, 2015. FINDINGS: The heart size and mediastinal contours are within normal limits. Normal pulmonary vascularity. Atherosclerotic calcification of the aortic arch. The lungs are hyperinflated with coarsened interstitial markings and scattered pleuroparenchymal scarring. No focal consolidation, pleural effusion, or pneumothorax. No acute osseous abnormality. Left upper extremity vascular stents. IMPRESSION: 1. COPD.  No active cardiopulmonary disease. 2.  Aortic atherosclerosis (ICD10-I70.0). Electronically Signed   By: Obie Dredge M.D.   On: 11/23/2017 12:48    EKG: I have Independently  reviewed EKG, LVH noted with repolarization abnormalities in ST-T waves  Assessment/Plan Principal Problem:    Symptomatic Normocytic anemia -significant drop in hemoglobin from recent baseline of 12 at her dialysis center few months ago to 7.4 now -will transfuse 1 unit of PRBC today and perhaps another one at dialysis tomorrow -Check anemia panel, Hemoccult negative 1 will repeat 2 more times -unclear if she is getting adequate Epo at HD -bilirubin normal which  rules out hemolysis, follow-up reticulocyte count   history of CAD -History of remote LAD stent in 2002 -negative stress test in 2017, she also had another repeat stress test this year at [facility] as part of transplant evaluation which was also unremarkable -Continue aspirin, metoprolol   Minimally elevated troponin -Due to increased demand from anemia, ESRD -Clinically no evidence of ACS and given negative stress test this year, no further need for cardiac evaluation at this time unless significant worsening of troponin or new symptoms  ESRD on hemodialysis Monday was a Friday -Renal Dr. Arlean Hopping consulted by EDP -Due for HD tomorrow   chronic systolic CHF -EF 74-16% based on echo in 2017 -Continue beta blocker, volume management hemodialysis  COPD -Stable, no wheezing, nebs PRN  Diabetes mellitus -Hold low-dose Lantus, sliding scale insulin for now  DVT prophylaxis: Hep SQ Code Status: Full Code  Family Communication: Husband at bedside Disposition Plan: home pending workup Consults called: Renal called by EDP  Admission status: inpatient   Zannie Cove MD [facility] Pager [phone]  If 7PM-7AM, please contact night-coverage [email or web address] Password [facility]  11/23/2017, 5:29 PM

## 2017-11-24 DIAGNOSIS — D649 Anemia, unspecified: Secondary | ICD-10-CM | POA: Diagnosis not present

## 2017-11-24 DIAGNOSIS — I132 Hypertensive heart and chronic kidney disease with heart failure and with stage 5 chronic kidney disease, or end stage renal disease: Secondary | ICD-10-CM | POA: Diagnosis not present

## 2017-11-24 DIAGNOSIS — J438 Other emphysema: Secondary | ICD-10-CM | POA: Diagnosis not present

## 2017-11-24 DIAGNOSIS — N186 End stage renal disease: Secondary | ICD-10-CM

## 2017-11-24 DIAGNOSIS — Z794 Long term (current) use of insulin: Secondary | ICD-10-CM

## 2017-11-24 DIAGNOSIS — E1129 Type 2 diabetes mellitus with other diabetic kidney complication: Secondary | ICD-10-CM

## 2017-11-24 DIAGNOSIS — I10 Essential (primary) hypertension: Secondary | ICD-10-CM

## 2017-11-24 DIAGNOSIS — Z992 Dependence on renal dialysis: Secondary | ICD-10-CM

## 2017-11-24 DIAGNOSIS — D631 Anemia in chronic kidney disease: Secondary | ICD-10-CM | POA: Diagnosis not present

## 2017-11-24 DIAGNOSIS — I5022 Chronic systolic (congestive) heart failure: Secondary | ICD-10-CM

## 2017-11-24 LAB — COMPREHENSIVE METABOLIC PANEL
ALBUMIN: 4.3 g/dL (ref 3.5–5.0)
ALK PHOS: 175 U/L — AB (ref 38–126)
ALT: 99 U/L — AB (ref 0–44)
AST: 65 U/L — ABNORMAL HIGH (ref 15–41)
Anion gap: 16 — ABNORMAL HIGH (ref 5–15)
BILIRUBIN TOTAL: 1 mg/dL (ref 0.3–1.2)
BUN: 71 mg/dL — AB (ref 8–23)
CALCIUM: 9.6 mg/dL (ref 8.9–10.3)
CO2: 26 mmol/L (ref 22–32)
CREATININE: 7.24 mg/dL — AB (ref 0.44–1.00)
Chloride: 94 mmol/L — ABNORMAL LOW (ref 98–111)
GFR calc Af Amer: 6 mL/min — ABNORMAL LOW (ref >60)
GFR calc non Af Amer: 5 mL/min — ABNORMAL LOW (ref >60)
GLUCOSE: 280 mg/dL — AB (ref 70–99)
Potassium: 4.2 mmol/L (ref 3.5–5.1)
SODIUM: 136 mmol/L (ref 135–145)
TOTAL PROTEIN: 7.2 g/dL (ref 6.5–8.1)

## 2017-11-24 LAB — CBC
HEMATOCRIT: 35.1 % — AB (ref 36.0–46.0)
HEMATOCRIT: 34.2 % — AB (ref 36.0–46.0)
HEMOGLOBIN: 11.4 g/dL — AB (ref 12.0–15.0)
Hemoglobin: 11 g/dL — ABNORMAL LOW (ref 12.0–15.0)
MCH: 29.6 pg (ref 26.0–34.0)
MCH: 29.5 pg (ref 26.0–34.0)
MCHC: 32.5 g/dL (ref 30.0–36.0)
MCHC: 32.2 g/dL (ref 30.0–36.0)
MCV: 91.2 fL (ref 78.0–100.0)
MCV: 91.7 fL (ref 78.0–100.0)
Platelets: 208 10*3/uL (ref 150–400)
Platelets: 249 10*3/uL (ref 150–400)
RBC: 3.85 MIL/uL — AB (ref 3.87–5.11)
RBC: 3.73 MIL/uL — ABNORMAL LOW (ref 3.87–5.11)
RDW: 16.9 % — ABNORMAL HIGH (ref 11.5–15.5)
RDW: 16.9 % — AB (ref 11.5–15.5)
WBC: 15.3 10*3/uL — ABNORMAL HIGH (ref 4.0–10.5)
WBC: 14.5 10*3/uL — AB (ref 4.0–10.5)

## 2017-11-24 LAB — IRON AND TIBC
Iron: 99 ug/dL (ref 28–170)
Saturation Ratios: 28 % (ref 10.4–31.8)
TIBC: 357 ug/dL (ref 250–450)
UIBC: 258 ug/dL

## 2017-11-24 LAB — RENAL FUNCTION PANEL
Albumin: 4.1 g/dL (ref 3.5–5.0)
Anion gap: 14 (ref 5–15)
BUN: 73 mg/dL — AB (ref 8–23)
CHLORIDE: 93 mmol/L — AB (ref 98–111)
CO2: 29 mmol/L (ref 22–32)
Calcium: 9.6 mg/dL (ref 8.9–10.3)
Creatinine, Ser: 7.32 mg/dL — ABNORMAL HIGH (ref 0.44–1.00)
GFR calc Af Amer: 6 mL/min — ABNORMAL LOW (ref >60)
GFR, EST NON AFRICAN AMERICAN: 5 mL/min — AB (ref >60)
Glucose, Bld: 297 mg/dL — ABNORMAL HIGH (ref 70–99)
POTASSIUM: 4 mmol/L (ref 3.5–5.1)
Phosphorus: 4.6 mg/dL (ref 2.5–4.6)
Sodium: 136 mmol/L (ref 135–145)

## 2017-11-24 LAB — FERRITIN: Ferritin: 1347 ng/mL — ABNORMAL HIGH (ref 11–307)

## 2017-11-24 LAB — TROPONIN I: Troponin I: 0.07 ng/mL (ref <0.03)

## 2017-11-24 LAB — GLUCOSE, CAPILLARY
Glucose-Capillary: 266 mg/dL — ABNORMAL HIGH (ref 70–99)
Glucose-Capillary: 177 mg/dL — ABNORMAL HIGH (ref 70–99)
Glucose-Capillary: 171 mg/dL — ABNORMAL HIGH (ref 70–99)

## 2017-11-24 LAB — RETICULOCYTES
RBC.: 3.83 MIL/uL — AB (ref 3.87–5.11)
RETIC COUNT ABSOLUTE: 149.4 10*3/uL (ref 19.0–186.0)
RETIC CT PCT: 3.9 % — AB (ref 0.4–3.1)

## 2017-11-24 LAB — FOLATE: Folate: 42.7 ng/mL (ref >5.9)

## 2017-11-24 LAB — HIV ANTIBODY (ROUTINE TESTING W REFLEX): HIV Screen 4th Generation wRfx: NONREACTIVE

## 2017-11-24 LAB — VITAMIN B12: VITAMIN B 12: 1139 pg/mL — AB (ref 180–914)

## 2017-11-24 LAB — HEPATITIS B SURFACE ANTIGEN: HEP B S AG: NEGATIVE

## 2017-11-24 MED ORDER — SUMATRIPTAN SUCCINATE 50 MG PO TABS
50.0000 mg | ORAL_TABLET | Freq: Once | ORAL | Status: AC
  Administered 2017-11-24: 50 mg via ORAL
  Filled 2017-11-24: qty 1

## 2017-11-24 MED ORDER — PENTAFLUOROPROP-TETRAFLUOROETH EX AERO: 1.0000 "application " | INHALATION_SPRAY | CUTANEOUS | Status: DC | PRN

## 2017-11-24 MED ORDER — TRAMADOL HCL 50 MG PO TABS
50.0000 mg | ORAL_TABLET | Freq: Once | ORAL | Status: DC
  Filled 2017-11-24: qty 1

## 2017-11-24 MED ORDER — HEPARIN SODIUM (PORCINE) 1000 UNIT/ML DIALYSIS: 3500.0000 [IU] | Freq: Once | INTRAMUSCULAR | Status: DC

## 2017-11-24 MED ORDER — LIDOCAINE-PRILOCAINE 2.5-2.5 % EX CREA: 1.0000 "application " | TOPICAL_CREAM | CUTANEOUS | Status: DC | PRN

## 2017-11-24 MED ORDER — SODIUM CHLORIDE 0.9 % IV SOLN: 100.0000 mL | INTRAVENOUS | Status: DC | PRN

## 2017-11-24 MED ORDER — METOPROLOL TARTRATE 5 MG/5ML IV SOLN
5.0000 mg | INTRAVENOUS | Status: AC | PRN
  Administered 2017-11-24 (×2): 5 mg via INTRAVENOUS
  Filled 2017-11-24 (×2): qty 5

## 2017-11-24 NOTE — Progress Notes (Signed)
Inpatient Diabetes Program Recommendations  AACE/ADA: New Consensus Statement on Inpatient Glycemic Control (2015)  Target Ranges:  Prepandial:   less than 140 mg/dL      Peak postprandial:   less than 180 mg/dL (1-2 hours)      Critically ill patients:  140 - 180 mg/dL   Results for MARIEA, KINGHORN (MRN [id number]) as of 11/24/2017 10:36  Ref. Range 11/23/2017 21:53 11/24/2017 06:20  Glucose-Capillary Latest Ref Range: 70 - 99 mg/dL 093 (H) 235 (H)   Review of Glycemic Control  Diabetes history: DM 2 Outpatient Diabetes medications: Lantus 5 units BID Current orders for Inpatient glycemic control: Novolog 0-9 units tid  Inpatient Diabetes Program Recommendations:    Glucose in the mid 200's. Correction not given since admission. Patient is on basal insulin at home. Consider half of home dose, Lantus 5 units Daily.  Thanks,  Christena Deem RN, MSN, BC-ADM, [facility] Inpatient Diabetes Coordinator Team Pager [phone] (8a-5p)

## 2017-11-24 NOTE — Progress Notes (Signed)
Nsg Discharge Note  Admit Date:  11/23/2017 Discharge date: 11/24/2017   Pamela [name] to be D/C'd Home per MD order.  AVS completed.  Copy for chart, and copy for patient signed, and dated. Patient/caregiver able to verbalize understanding.  Discharge Medication: Allergies as of 11/24/2017   No Known Allergies     Medication List    TAKE these medications   ALPRAZolam 0.5 MG tablet Commonly known as:  XANAX Take 1 tablet (0.5 mg total) by mouth at bedtime. What changed:  when to take this   amLODipine 10 MG tablet Commonly known as:  NORVASC Take 10 mg by mouth daily.   aspirin EC 81 MG tablet Take 81 mg by mouth daily.   feeding supplement (NEPRO CARB STEADY) Liqd Take 237 mLs by mouth daily. What changed:  how much to take   LANTUS SOLOSTAR 100 UNIT/ML Solostar Pen Generic drug:  Insulin Glargine Inject 5 units into the skin twice daily.   latanoprost 0.005 % ophthalmic solution Commonly known as:  XALATAN Place 1 drop into both eyes at bedtime.   lidocaine-prilocaine cream Commonly known as:  EMLA Apply 1 application topically daily as needed.   metoprolol succinate 50 MG 24 hr tablet Commonly known as:  TOPROL-XL Take 50 mg by mouth at bedtime.   multivitamin Tabs tablet Take 1 tablet by mouth daily.   nitroGLYCERIN 0.4 MG SL tablet Commonly known as:  NITROSTAT Place 0.4 mg under the tongue every 5 (five) minutes as needed for chest pain.   sevelamer carbonate 800 MG tablet Commonly known as:  RENVELA Take 1,600-2,400 mg by mouth See admin instructions. Take 2400 mg with meals and 1600 mg with snacks       Discharge Assessment: Vitals:   11/24/17 1210 11/24/17 1314  BP: (!) 168/78 (!) 168/60  Pulse: 98 (!) 103  Resp: 16 16  Temp: 98.6 F (37 C) 98.2 F (36.8 C)  SpO2: 97% 92%   Skin clean, dry and intact without evidence of skin break down, no evidence of skin tears noted. IV catheter discontinued intact. Site without signs and symptoms  of complications - no redness or edema noted at insertion site, patient denies c/o pain - only slight tenderness at site.  Dressing with slight pressure applied.  D/c Instructions-Education: Discharge instructions given to patient/family with verbalized understanding. D/c education completed with patient/family including follow up instructions, medication list, d/c activities limitations if indicated, with other d/c instructions as indicated by MD - patient able to verbalize understanding, all questions fully answered. Patient instructed to return to ED, call 911, or call MD for any changes in condition.  Patient escorted via WC, and D/C home via private auto.  [place] N John Vasconcelos, RN 11/24/2017 3:36 PM

## 2017-11-24 NOTE — Procedures (Signed)
I was present at this dialysis session. I have reviewed the session itself and made appropriate changes.   S/p 2u PRBC yesterday, Hb inc appropriately.  HD today with AVF, 2L UF goal.  Still with general malaise; just finished transfusion prior to HD, see how she feels post Tx.    Next HD on 7/17.  OK with DC if overall feels better post HD.     Filed Weights   11/23/17 1120 11/24/17 0800  Weight: 54.4 kg (120 lb) 56.7 kg (125 lb)    Recent Labs  Lab 11/24/17 0605  NA 136  K 4.2  CL 94*  CO2 26  GLUCOSE 280*  BUN 71*  CREATININE 7.24*  CALCIUM 9.6    Recent Labs  Lab 11/23/17 1014 11/24/17 0605 11/24/17 0810  WBC 11.4* 15.3* 14.5*  HGB 7.4* 11.4* 11.0*  HCT 23.6* 35.1* 34.2*  MCV 95.9 91.2 91.7  PLT 231 208 249    Scheduled Meds: . amLODipine  10 mg Oral Daily  . aspirin EC  81 mg Oral Daily  . Chlorhexidine Gluconate Cloth  6 each Topical Q0600  . [START ON 11/25/2017] heparin  3,500 Units Dialysis Once in dialysis  . heparin  5,000 Units Subcutaneous Q8H  . insulin aspart  0-9 Units Subcutaneous TID WC  . latanoprost  1 drop Both Eyes QHS  . metoprolol succinate  50 mg Oral QHS  . multivitamin  1 tablet Oral Daily  . sevelamer carbonate  2,400 mg Oral TID WC   Continuous Infusions: . sodium chloride     PRN Meds:.sodium chloride, acetaminophen **OR** acetaminophen, ALPRAZolam, lidocaine-prilocaine, nitroGLYCERIN, ondansetron **OR** ondansetron (ZOFRAN) IV, pentafluoroprop-tetrafluoroeth, sevelamer carbonate   Sabra Heck  MD 11/24/2017, 9:04 AM

## 2017-11-24 NOTE — Discharge Summary (Signed)
Physician Discharge Summary  Pamela [name] DUK:[id number] DOB: [birth year] DOA: 11/23/2017  PCP: Georgann Housekeeper, MD  Admit date: 11/23/2017 Discharge date: 11/24/2017  Admitted From: Home Disposition: Home   Recommendations for Outpatient Follow-up:  1. Follow up with PCP in 1-2 weeks 2. Routine HD, recommend monitoring CBC closely and dosing mircera as indicated.   Home Health: None Equipment/Devices: None Discharge Condition: Stable CODE STATUS: Full Diet recommendation: Renal, carb-modified  Brief/Interim Summary: Pamela [name] is a 67 y.o. female with medical history significant of ESRD on hemodialysis MWF, chronic systolic CHF, COPD, CAD with remote stent, diabetes mellitus presented to the emergency room today with progressive weakness, nausea, generalized fatigue for the last 3 days. -She denies any chest pain, vomiting, diarrhea, fevers chills or  Wheezing. -she is compliant with her hemodialysis, and [place] she was switched from a MWF to TTS schedule however did not miss any treatments. Patient reports that her dry weight was increased about a month ago, and at baseline and her hemoglobin is usually around 12 when last checked at dialysis a few months ago. -patient denies any change in the color of her stools, no hematemesis melena or hematochezia  ED Course: in the emergency room noted to have hemoglobin of 7.4 which is more than 4 g lower than baseline, Hemoccults negative 1, troponin of 0.7, chest x-ray was unremarkable  Hospital Course: Nephrology was consulted and noted that the patient's mircera dose had been decreased with progressive decrease in hemoglobin, recently (7/10 was increased due to anemia. She was given 2u PRBCs with improvement in blood counts (hgb 11) and significant improvement in symptoms. Routine dialysis was administered 7/15.   Discharge Diagnoses:  Principal Problem:   Anemia due to chronic kidney disease Active  Problems:   Essential hypertension   COPD (chronic obstructive pulmonary disease) (HCC)   Chronic systolic heart failure (HCC)   ESRD on dialysis (HCC)   DM (diabetes mellitus) (HCC)   Anemia   Symptomatic anemia  Symptomatic anemia of chronic renal disease: Felt to be sequela of decreased EPO as outpatient. Per Dr. Malena Catholic consult note on review of outpatient records: "Pt was receiving 50 ug Mircera then ^'d to 100 ug dose given on 09/17/17, then Hb went up to > 12 and mircera was dc'd.  Then Hb on 6/19 was 11.7, on 7/3 was 9.7 and on 7/10 was down to 8.1 and Mircera was restarted w/ 75 ug given on 7/10.  Tsat's have all been > 30%." - Hgb 7.4 > 11 after 2u PRBCs. Patient reports dramatic improvement in symptoms and wants to go home.  - Hemoccult negative, bilirubin normal which rules out hemolysis.  Leukocytosis: Without fever. No CXR findings or history of cough. No focal areas of pain or cellulitis.  - Recommend follow up CBC - Continue monitoring off antimicrobials. Discussed return precautions with the patient.  History of CAD: History of remote LAD stent in 2002. Negative stress test in 2017, she also had another repeat stress test this year at [facility] as part of transplant evaluation which was also unremarkable - Continue aspirin, metoprolol  Minimally elevated troponin: Due to increased demand from anemia, ESRD. Clinically no evidence of ACS and given negative stress test this year. - No further need for cardiac evaluation at this time unless significant worsening of troponin or new symptoms  ESRD: Has not missed Tx even on vacation. Had change MWF > TTS and now back on schedule after HD this  AM.  Chronic systolic CHF: EF 14-78% based on echo in 2017. Appears euvolemic.  - Continue BB, volume managed with dialysis.   COPD: No wheezing to indicate acute exacerbation.  - Continue home medications.   T2DM:  - Continue home medications (insulin)  Discharge  Instructions Discharge Instructions    Diet - low sodium heart healthy   Complete by:  As directed    Diet Carb Modified   Complete by:  As directed    Discharge instructions   Complete by:  As directed    You were admitted for symptomatic anemia and have significantly improved (feeling much better AND your blood count is 11 now) so you are stable for discharge.  - Continue following up with your kidney doctor, routine hemodialysis, where you will get bone marrow stimulating injections to help your body make more red blood cells.  - If you notice any bleeding, unusual bruising you need to seek medical attention right away.  - If you have any symptoms of an infection, like fever or chills, productive cough, tender redness on skin or wounds with pus, seek medical attention right away.   Increase activity slowly   Complete by:  As directed      Allergies as of 11/24/2017   No Known Allergies     Medication List    TAKE these medications   ALPRAZolam 0.5 MG tablet Commonly known as:  XANAX Take 1 tablet (0.5 mg total) by mouth at bedtime. What changed:  when to take this   amLODipine 10 MG tablet Commonly known as:  NORVASC Take 10 mg by mouth daily.   aspirin EC 81 MG tablet Take 81 mg by mouth daily.   feeding supplement (NEPRO CARB STEADY) Liqd Take 237 mLs by mouth daily. What changed:  how much to take   LANTUS SOLOSTAR 100 UNIT/ML Solostar Pen Generic drug:  Insulin Glargine Inject 5 units into the skin twice daily.   latanoprost 0.005 % ophthalmic solution Commonly known as:  XALATAN Place 1 drop into both eyes at bedtime.   lidocaine-prilocaine cream Commonly known as:  EMLA Apply 1 application topically daily as needed.   metoprolol succinate 50 MG 24 hr tablet Commonly known as:  TOPROL-XL Take 50 mg by mouth at bedtime.   multivitamin Tabs tablet Take 1 tablet by mouth daily.   nitroGLYCERIN 0.4 MG SL tablet Commonly known as:  NITROSTAT Place 0.4 mg  under the tongue every 5 (five) minutes as needed for chest pain.   sevelamer carbonate 800 MG tablet Commonly known as:  RENVELA Take 1,600-2,400 mg by mouth See admin instructions. Take 2400 mg with meals and 1600 mg with snacks      Follow-up Information    Georgann Housekeeper, MD. Schedule an appointment as soon as possible for a visit.   Specialty:  Internal Medicine Contact information: [address] [place] [address] [phone]          No Known Allergies  Consultations:  Nephrology  Procedures/Studies: Dg Chest 2 View  Result Date: 11/23/2017 CLINICAL DATA:  Shortness of breath. EXAM: CHEST - 2 VIEW COMPARISON:  Chest x-ray dated January 31, 2015. FINDINGS: The heart size and mediastinal contours are within normal limits. Normal pulmonary vascularity. Atherosclerotic calcification of the aortic arch. The lungs are hyperinflated with coarsened interstitial markings and scattered pleuroparenchymal scarring. No focal consolidation, pleural effusion, or pneumothorax. No acute osseous abnormality. Left upper extremity vascular stents. IMPRESSION: 1. COPD.  No active cardiopulmonary  disease. 2.  Aortic atherosclerosis (ICD10-I70.0). Electronically Signed   By: Obie Dredge M.D.   On: 11/23/2017 12:48    Routine HD 11/24/2017  Subjective: Seen this morning in HD, felt tired. Reevaluated this afternoon after HD and she was significantly perkier. Her husband says "it's like a new woman came back from dialysis." Patient denies dyspnea, abd pain, N/V/D, headache. Feels generally fatigued but improved and attributed to not doing much since last Wednesday. Wants to go home.   Discharge Exam: Vitals:   11/24/17 1210 11/24/17 1314  BP: (!) 168/78 (!) 168/60  Pulse: 98 (!) 103  Resp: 16 16  Temp: 98.6 F (37 C) 98.2 F (36.8 C)  SpO2: 97% 92%   General: Pt is alert, awake, not in acute distress Cardiovascular: RRR, S1/S2 +, no rubs, no  gallops Respiratory: Nonlabored on room air. No prolongation of expiration or wheezing.  Abdominal: Soft, NT, ND, bowel sounds + Extremities: No edema, no cyanosis. LUE AVF +thrill.   Labs: BNP (last 3 results) No results for input(s): BNP in the last 8760 hours. Basic Metabolic Panel: Recent Labs  Lab 11/23/17 1014 11/24/17 0605 11/24/17 0810  NA 135 136 136  K 3.8 4.2 4.0  CL 92* 94* 93*  CO2 31 26 29   GLUCOSE 228* 280* 297*  BUN 51* 71* 73*  CREATININE 6.19* 7.24* 7.32*  CALCIUM 9.5 9.6 9.6  PHOS  --   --  4.6   Liver Function Tests: Recent Labs  Lab 11/23/17 1014 11/24/17 0605 11/24/17 0810  AST 51* 65*  --   ALT 83* 99*  --   ALKPHOS 155* 175*  --   BILITOT 0.6 1.0  --   PROT 6.6 7.2  --   ALBUMIN 3.9 4.3 4.1   Recent Labs  Lab 11/23/17 1014  LIPASE 49   No results for input(s): AMMONIA in the last 168 hours. CBC: Recent Labs  Lab 11/23/17 1014 11/24/17 0605 11/24/17 0810  WBC 11.4* 15.3* 14.5*  HGB 7.4* 11.4* 11.0*  HCT 23.6* 35.1* 34.2*  MCV 95.9 91.2 91.7  PLT 231 208 249   Cardiac Enzymes: Recent Labs  Lab 11/23/17 1014 11/24/17 0605  TROPONINI 0.07* 0.07*   BNP: Invalid input(s): POCBNP CBG: Recent Labs  Lab 11/23/17 2153 11/24/17 0620 11/24/17 1123 11/24/17 1245  GLUCAP 283* 266* 177* 171*   D-Dimer No results for input(s): DDIMER in the last 72 hours. Hgb A1c No results for input(s): HGBA1C in the last 72 hours. Lipid Profile No results for input(s): CHOL, HDL, LDLCALC, TRIG, CHOLHDL, LDLDIRECT in the last 72 hours. Thyroid function studies No results for input(s): TSH, T4TOTAL, T3FREE, THYROIDAB in the last 72 hours.  Invalid input(s): FREET3 Anemia work up Recent Labs    11/24/17 0605  VITAMINB12 1,139*  FOLATE 42.7  FERRITIN 1,347*  TIBC 357  IRON 99  RETICCTPCT 3.9*   Urinalysis    Component Value Date/Time   COLORURINE YELLOW 01/21/2015 0900   APPEARANCEUR CLOUDY (A) 01/21/2015 0900   LABSPEC 1.007  01/21/2015 0900   PHURINE 5.5 01/21/2015 0900   GLUCOSEU NEGATIVE 01/21/2015 0900   HGBUR MODERATE (A) 01/21/2015 0900   BILIRUBINUR NEGATIVE 01/21/2015 0900   KETONESUR NEGATIVE 01/21/2015 0900   PROTEINUR 100 (A) 01/21/2015 0900   UROBILINOGEN 0.2 01/21/2015 0900   NITRITE NEGATIVE 01/21/2015 0900   LEUKOCYTESUR TRACE (A) 01/21/2015 0900    Microbiology No results found for this or any previous visit (from the past 240 hour(s)).  Time coordinating  discharge: Approximately 40 minutes  Tyrone Nine, MD  [facility] 11/24/2017, 2:27 PM Pager [phone]

## 2017-11-25 LAB — TYPE AND SCREEN
ABO/RH(D): A POS
ANTIBODY SCREEN: NEGATIVE
UNIT DIVISION: 0
Unit division: 0

## 2017-11-25 LAB — BPAM RBC
BLOOD PRODUCT EXPIRATION DATE: 201907252359
BLOOD PRODUCT EXPIRATION DATE: 201907252359
ISSUE DATE / TIME: 201907141703
ISSUE DATE / TIME: 201907150010
UNIT TYPE AND RH: 6200
UNIT TYPE AND RH: 6200

## 2017-11-25 LAB — HEPATITIS B SURFACE ANTIBODY,QUALITATIVE: Hep B S Ab: NONREACTIVE

## 2017-12-08 ENCOUNTER — Telehealth: Payer: Self-pay | Admitting: Interventional Cardiology

## 2017-12-08 NOTE — Telephone Encounter (Signed)
Patient's husband calling, stating wife is experiencing SOB, Fatigue and CP.  She was in the hospital recently with a low HgB and received 4 units of blood.  I advised him to take her to the ED or call EMS pending severity of CP.  He verbalized understanding.

## 2017-12-08 NOTE — Telephone Encounter (Signed)
New message   Heart is beating fast and skipping.   Patient c/o Palpitations:  High priority if patient c/o lightheadedness, shortness of breath, or chest pain  1) How long have you had palpitations/irregular HR/ Afib? Are you having the symptoms now? Pt husband states that the pt  has had fast heartbeat in past 2 weeks and is having symptoms now.  Are you currently experiencing lightheadedness, SOB or CP?  Sob, chest pain and lightheadedness  2) Do you have a history of afib (atrial fibrillation) or irregular heart rhythm? no  3) Have you checked your BP or HR? (document readings if available): 160/70 bp 80 to 90 hr  4) Are you experiencing any other symptoms?  Fatigue

## 2017-12-15 HISTORY — PX: KIDNEY TRANSPLANT: SHX239

## 2018-02-09 NOTE — Progress Notes (Signed)
Cardiology Office Note:    Date:  02/10/2018   ID:  TARRON OLSSON, DOB 1951-03-06, MRN [id number]  PCP:  Georgann Housekeeper, MD  Cardiologist:  No primary care provider on file.   Referring MD: Georgann Housekeeper, MD   Chief Complaint  Patient presents with  . Congestive Heart Failure    History of Present Illness:    Pamela [name] is a 67 y.o. 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  Successful renal transplant December 15, 2017  She feels great.  Says she is doing well and had no complications related to kidney transplantation.  No cardiac issues.  She denies chest pain, shortness of breath, orthopnea, and PND.  She has not had angina nor peripheral edema.  She is able to lie flat.   Past Medical History:  Diagnosis Date  . Anemia   . Anxiety   . CHF (congestive heart failure) (HCC)   . Chronic kidney disease   . COPD (chronic obstructive pulmonary disease) (HCC)   . Coronary artery disease   . Diabetes mellitus without complication (HCC) 10-29-12  . Headache   . Hypertension   . PONV (postoperative nausea and vomiting)   . Shortness of breath dyspnea     Past Surgical History:  Procedure Laterality Date  . ABDOMINAL HYSTERECTOMY    . ANAL FISSURE REPAIR    . APPENDECTOMY     '74- open with gallbladder  . AV FISTULA PLACEMENT Left 01/31/2015   Procedure: ARTERIOVENOUS FISTULA CREATION-LEFT BRACHIO-CEPHALIC ;  Surgeon: Fransisco Hertz, MD;  Location: [facility] OR;  Service: Vascular;  Laterality: Left;  . AV FISTULA PLACEMENT Left 04/04/2015   Procedure: INSERTION OF ARTERIOVENOUS (AV) GORE-TEX GRAFT ARM;  Surgeon: Sherren Kerns, MD;  Location: [facility] OR;  Service: Vascular;  Laterality: Left;  . BREAST EXCISIONAL BIOPSY Left   . BREAST SURGERY      cyst removed  . CARDIAC CATHETERIZATION     1 coronary stent placed  . CHOLECYSTECTOMY     '74-open  . COLONOSCOPY WITH PROPOFOL N/A 11/17/2012   Procedure: COLONOSCOPY WITH PROPOFOL;  Surgeon: Charolett Bumpers, MD;  Location: [facility] ENDOSCOPY;  Service: Endoscopy;  Laterality: N/A;  . CORONARY STENT PLACEMENT    . ELBOW SURGERY Right    tendon surgery  . ESOPHAGOGASTRODUODENOSCOPY (EGD) WITH PROPOFOL N/A 11/17/2012   Procedure: ESOPHAGOGASTRODUODENOSCOPY (EGD) WITH PROPOFOL;  Surgeon: Charolett Bumpers, MD;  Location: [facility] ENDOSCOPY;  Service: Endoscopy;  Laterality: N/A;  . GANGLION CYST EXCISION Bilateral 10-29-12   wrist  . LEFT HEART CATHETERIZATION WITH CORONARY ANGIOGRAM N/A 04/14/2014   Procedure: LEFT HEART CATHETERIZATION WITH CORONARY ANGIOGRAM;  Surgeon: Lesleigh Noe, MD;  Location: [facility] CATH LAB;  Service: Cardiovascular;  Laterality: N/A;  . PERIPHERAL VASCULAR CATHETERIZATION N/A 03/09/2015   Procedure: Fistulagram;  Surgeon: Fransisco Hertz, MD;  Location: [facility] INVASIVE CV LAB;  Service: Cardiovascular;  Laterality: N/A;  . TEE WITHOUT CARDIOVERSION N/A 01/20/2015   Procedure: TRANSESOPHAGEAL ECHOCARDIOGRAM (TEE);  Surgeon: Pricilla Riffle, MD;  Location: [facility] ENDOSCOPY;  Service: Cardiovascular;  Laterality: N/A;  . TUBAL LIGATION      Current Medications: Current Meds  Medication Sig  . amLODipine (NORVASC) 10 MG tablet Take 10 mg by mouth daily.  Marland Kitchen aspirin EC 81 MG  tablet Take 81 mg by mouth daily.  Marland Kitchen atorvastatin (LIPITOR) 10 MG tablet Take by mouth.  . carvedilol (COREG) 25 MG tablet Take 12.5 mg by mouth 2 (two) times daily with a meal.   . insulin glargine (LANTUS) 100 UNIT/ML injection Inject 9 Units into the skin 2 (two) times daily.  . insulin lispro (HUMALOG) 100 UNIT/ML KiwkPen Inject into the skin. Take 4 units in the skin in the a.m Take 6 units in the skin at lunch Take 8 units in the skin at supper  . latanoprost (XALATAN) 0.005 % ophthalmic solution Place 1 drop into  both eyes at bedtime.  . mycophenolate (CELLCEPT) 250 MG capsule Take 750 mg by mouth.   . nitroGLYCERIN (NITROSTAT) 0.4 MG SL tablet Place 0.4 mg under the tongue every 5 (five) minutes as needed for chest pain.  Marland Kitchen NUTRITIONAL SUPPLEMENT LIQD Take 118 mLs by mouth daily.  . predniSONE (DELTASONE) 5 MG tablet Take 5 mg by mouth daily with breakfast.   . ranitidine (ZANTAC) 150 MG tablet Take 150 mg by mouth daily.   . sertraline (ZOLOFT) 100 MG tablet Take 100 mg by mouth daily.   Marland Kitchen sulfamethoxazole-trimethoprim (BACTRIM DS,SEPTRA DS) 800-160 MG tablet Take by mouth 3 (three) times a week. Monday, wednesdays, & fridays  . Tacrolimus (ENVARSUS XR) 1 MG TB24 Take 3 mg by mouth daily.   Marland Kitchen umeclidinium-vilanterol (ANORO ELLIPTA) 62.5-25 MCG/INH AEPB Inhale 1 puff into the lungs daily.   . valGANciclovir (VALCYTE) 450 MG tablet Take 450 mg by mouth daily.   . [DISCONTINUED] ipratropium-albuterol (DUONEB) 0.5-2.5 (3) MG/3ML SOLN Inhale into the lungs.     Allergies:   Patient has no known allergies.   Social History   Socioeconomic History  . Marital status: Married    Spouse name: Not on file  . Number of children: Not on file  . Years of education: Not on file  . Highest education level: Not on file  Occupational History  . Not on file  Social Needs  . Financial resource strain: Not on file  . Food insecurity:    Worry: Not on file    Inability: Not on file  . Transportation needs:    Medical: Not on file    Non-medical: Not on file  Tobacco Use  . Smoking status: Former Smoker    Packs/day: 1.50    Types: Cigarettes    Last attempt to quit: 10/29/1992    Years since quitting: 25.3  . Smokeless tobacco: Never Used  Substance and Sexual Activity  . Alcohol use: No    Alcohol/week: 0.0 standard drinks  . Drug use: No  . Sexual activity: Not Currently  Lifestyle  . Physical activity:    Days per week: Not on file    Minutes per session: Not on file  . Stress: Not on file    Relationships  . Social connections:    Talks on phone: Not on file    Gets together: Not on file    Attends religious service: Not on file    Active member of club or organization: Not on file    Attends meetings of clubs or organizations: Not on file    Relationship status: Not on file  Other Topics Concern  . Not on file  Social History Narrative  . Not on file     Family History: The patient's family history includes Breast cancer in her sister; Cancer in her father and sister; Diabetes in her father and  sister; Heart attack in her mother; Hypertension in her father, mother, sister, and sister.  ROS:   Please see the history of present illness.    Some decreased hearing.  Occasional headaches and occasional fever.  All other systems reviewed and are negative.  EKGs/Labs/Other Studies Reviewed:    The following studies were reviewed today:  2D Doppler echocardiogram April 2017: Study Conclusions  - Left ventricle: The cavity size was normal. Wall thickness was   normal. Systolic function was moderately reduced. The estimated   ejection fraction was in the range of 35% to 40%. Diffuse   hypokinesis. Doppler parameters are consistent with abnormal left   ventricular relaxation (grade 1 diastolic dysfunction). - Aortic valve: There was no stenosis. - Mitral valve: There was trivial regurgitation. - Right ventricle: The cavity size was normal. Systolic function   was normal. - Pulmonary arteries: No complete TR doppler jet so unable to   estimate PA systolic pressure. - Inferior vena cava: The vessel was normal in size. The   respirophasic diameter changes were in the normal range (>= 50%),   consistent with normal central venous pressure. - Pericardium, extracardiac: A trivial pericardial effusion was   identified.  Impressions:  - Normal LV size with EF 35-40%, diffuse hypokinesis. Normal RV   size and systolic function. No significant valvular    abnormalities.  2D Doppler echocardiogram 2019 [facility] post transplantation: INTERPRETATION ---------------------------------------------------------------   MODERATE LV DYSFUNCTION (See above) WITH MILD LVH   NORMAL RIGHT VENTRICULAR SYSTOLIC FUNCTION   VALVULAR REGURGITATION: MILD MR, TRIVIAL PR, TRIVIAL TR   NO VALVULAR STENOSIS   NO PRIOR STUDY FOR COMPARISON Echocardiogram stated to be 40% EF  EKG:  EKG is not ordered today.   Recent Labs: 11/24/2017: ALT 99; BUN 73; Creatinine, Ser 7.32; Hemoglobin 11.0; Platelets 249; Potassium 4.0; Sodium 136  Recent Lipid Panel    Component Value Date/Time   TRIG 102 01/20/2015 0908    Physical Exam:    VS:  BP (!) 128/50   Pulse 83   Ht 5' 1.5" (1.562 m)   Wt 114 lb 9.6 oz (52 kg)   SpO2 99%   BMI 21.30 kg/m     Wt Readings from Last 3 Encounters:  02/10/18 114 lb 9.6 oz (52 kg)  11/24/17 121 lb 4.1 oz (55 kg)  04/08/17 123 lb 1.9 oz (55.8 kg)   Appears less chronically ill than on previous visits.  Well nourished, well developed in no acute distress HEENT: Normal NECK: No JVD. LYMPHATICS: No lymphadenopathy CARDIAC: RRR, nomurmur, nogallop, no edema. VASCULAR: Absent left radial pulse with continuous bruit heard in the left arm, left shoulder, left subclavicular, and left carotid..  RESPIRATORY:  Clear to auscultation without rales, wheezing or rhonchi  ABDOMEN: Soft, non-tender, non-distended, No pulsatile mass, MUSCULOSKELETAL: No deformity  SKIN: Warm and dry NEUROLOGIC:  Alert and oriented x 3 PSYCHIATRIC:  Normal affect   ASSESSMENT:    1. CAD in native artery   2. Chronic systolic heart failure (HCC)   3. Essential hypertension   4. 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 ESRD on dialysis [facility])    PLAN:    In order of problems listed above:  1. Stable.  Notify of chest pain. 2. Assess current  LV systolic function.  Agree with beta-blocker therapy to prevent progression of LV dysfunction.  If possible the addition of renin angiotensin  aldosterone system blockade would be helpful. 3. Current blood pressure is excellent. 4. Target A1c less than 7 5. Status post transplantation.  Most recent [facility] creatinine 1.1 potassium 5.2.  Continue to monitor LV function over time.  Hopefully with better kidney function LV stress will be decreased and there will not be tendency towards progression of systolic dysfunction.  The transplanted kidney however may provide stress from the standpoint of renin aldosterone system.  Monitor closely.    Medication Adjustments/Labs and Tests Ordered: Current medicines are reviewed at length with the patient today.  Concerns regarding medicines are outlined above.  No orders of the defined types were placed in this encounter.  No orders of the defined types were placed in this encounter.   Patient Instructions  Medication Instructions:  Your physician recommends that you continue on your current medications as directed. Please refer to the Current Medication list given to you today.  Labwork: None  Testing/Procedures: Your physician has requested that you have an echocardiogram anytime between now and when you follow up. Echocardiography is a painless test that uses sound waves to create images of your heart. It provides your doctor with information about the size and shape of your heart and how well your heart's chambers and valves are working. This procedure takes approximately one hour. There are no restrictions for this procedure.   Follow-Up: Your physician wants you to follow-up in: 8-12 months with Dr. Katrinka Blazing.  You will receive a reminder letter in the mail two months in advance. If you don't receive a letter, please call our office to schedule the follow-up appointment.   Any Other Special Instructions Will Be Listed Below (If  Applicable).     If you need a refill on your cardiac medications before your next appointment, please call your pharmacy.      Signed, Lesleigh Noe, MD  02/10/2018 8:18 AM    [facility]

## 2018-02-10 ENCOUNTER — Ambulatory Visit (INDEPENDENT_AMBULATORY_CARE_PROVIDER_SITE_OTHER): Payer: Medicare Other | Admitting: Interventional Cardiology

## 2018-02-10 ENCOUNTER — Encounter: Payer: Self-pay | Admitting: Interventional Cardiology

## 2018-02-10 VITALS — BP 128/50 | HR 83 | Ht 61.5 in | Wt 114.6 lb

## 2018-02-10 DIAGNOSIS — I10 Essential (primary) hypertension: Secondary | ICD-10-CM | POA: Diagnosis not present

## 2018-02-10 DIAGNOSIS — I5022 Chronic systolic (congestive) heart failure: Secondary | ICD-10-CM

## 2018-02-10 DIAGNOSIS — Z794 Long term (current) use of insulin: Secondary | ICD-10-CM

## 2018-02-10 DIAGNOSIS — Z940 Kidney transplant status: Secondary | ICD-10-CM

## 2018-02-10 DIAGNOSIS — I2510 Atherosclerotic heart disease of native coronary artery without angina pectoris: Secondary | ICD-10-CM | POA: Diagnosis not present

## 2018-02-10 DIAGNOSIS — E0859 Diabetes mellitus due to underlying condition with other circulatory complications: Secondary | ICD-10-CM | POA: Diagnosis not present

## 2018-02-10 NOTE — Patient Instructions (Addendum)
Medication Instructions:  Your physician recommends that you continue on your current medications as directed. Please refer to the Current Medication list given to you today.  Labwork: None  Testing/Procedures: None  Follow-Up: Your physician wants you to follow-up in: 8-12 months with Dr. Smith.  You will receive a reminder letter in the mail two months in advance. If you don't receive a letter, please call our office to schedule the follow-up appointment.   Any Other Special Instructions Will Be Listed Below (If Applicable).     If you need a refill on your cardiac medications before your next appointment, please call your pharmacy.   

## 2018-06-18 ENCOUNTER — Other Ambulatory Visit: Payer: Self-pay | Admitting: Internal Medicine

## 2018-06-18 DIAGNOSIS — Z1231 Encounter for screening mammogram for malignant neoplasm of breast: Secondary | ICD-10-CM

## 2018-07-10 NOTE — Telephone Encounter (Signed)
ERROR

## 2018-07-15 ENCOUNTER — Ambulatory Visit
Admission: RE | Admit: 2018-07-15 | Discharge: 2018-07-15 | Disposition: A | Discharge status: Home / Self Care | Payer: Medicare Other | Source: Ambulatory Visit | Attending: Internal Medicine | Admitting: Internal Medicine

## 2018-07-15 DIAGNOSIS — Z1231 Encounter for screening mammogram for malignant neoplasm of breast: Secondary | ICD-10-CM

## 2018-10-12 ENCOUNTER — Ambulatory Visit: Payer: Medicare Other | Admitting: Interventional Cardiology

## 2019-01-19 NOTE — Progress Notes (Signed)
Cardiology Office Note:    Date:  01/20/2019   ID:  Pamela [name], DOB 17-Mar-1951, MRN [id number]  PCP:  Wenda Low, MD  Cardiologist:  No primary care provider on file.   Referring MD: Wenda Low, MD   Chief Complaint  Patient presents with  . Coronary Artery Disease  . Congestive Heart Failure  . Follow-up    Kidney transplant    History of Present Illness:    Pamela [name] is a 68 y.o. female with a hx of dialysis dependent end-stage kidney disease, acute systolic heart failure acute onset late 2016 during sepsis episode --> chronic combined systolic and diastolic HF EF 82% 9562, CAD with prior stenting, hypertension, and diabetes mellitus.  Successful renal transplant December 15, 2017.  [name] is doing well.  She denies angina.  No significant shortness of breath.  She has gained weight over the past 12 months.  She has not had swelling.  No irregularity in heartbeat.  Lower extremity swelling is resolved.  She is able to lie flat when she sleeps.  Status post kidney transplant.  She is having some side effects on tacrolimus.  Prednisone is also causing hyperglycemia.  Past Medical History:  Diagnosis Date  . Anemia   . Anxiety   . CHF (congestive heart failure) ([place])   . Chronic kidney disease   . COPD (chronic obstructive pulmonary disease) ([place])   . Coronary artery disease   . Diabetes mellitus without complication ([place]) 06-11-84  . Headache   . Hypertension   . PONV (postoperative nausea and vomiting)   . Shortness of breath dyspnea     Past Surgical History:  Procedure Laterality Date  . ABDOMINAL HYSTERECTOMY    . ANAL FISSURE REPAIR    . APPENDECTOMY     '74- open with gallbladder  . AV FISTULA PLACEMENT Left 01/31/2015   Procedure: ARTERIOVENOUS FISTULA CREATION-LEFT BRACHIO-CEPHALIC ;  Surgeon: Conrad Irwindale, MD;  Location: [place];  Service: Vascular;  Laterality: Left;  . AV FISTULA PLACEMENT Left 04/04/2015   Procedure: INSERTION OF  ARTERIOVENOUS (AV) GORE-TEX GRAFT ARM;  Surgeon: Elam Dutch, MD;  Location: [place];  Service: Vascular;  Laterality: Left;  . BREAST EXCISIONAL BIOPSY Left   . BREAST SURGERY     cyst removed  . CARDIAC CATHETERIZATION     1 coronary stent placed  . CHOLECYSTECTOMY     '74-open  . COLONOSCOPY WITH PROPOFOL N/A 11/17/2012   Procedure: COLONOSCOPY WITH PROPOFOL;  Surgeon: Garlan Fair, MD;  Location: [facility] ENDOSCOPY;  Service: Endoscopy;  Laterality: N/A;  . CORONARY STENT PLACEMENT    . ELBOW SURGERY Right    tendon surgery  . ESOPHAGOGASTRODUODENOSCOPY (EGD) WITH PROPOFOL N/A 11/17/2012   Procedure: ESOPHAGOGASTRODUODENOSCOPY (EGD) WITH PROPOFOL;  Surgeon: Garlan Fair, MD;  Location: [facility] ENDOSCOPY;  Service: Endoscopy;  Laterality: N/A;  . GANGLION CYST EXCISION Bilateral 10-29-12   wrist  . LEFT HEART CATHETERIZATION WITH CORONARY ANGIOGRAM N/A 04/14/2014   Procedure: LEFT HEART CATHETERIZATION WITH CORONARY ANGIOGRAM;  Surgeon: Sinclair Grooms, MD;  Location: [facility] CATH LAB;  Service: Cardiovascular;  Laterality: N/A;  . PERIPHERAL VASCULAR CATHETERIZATION N/A 03/09/2015   Procedure: Fistulagram;  Surgeon: Conrad Bellaire, MD;  Location: [place] CV LAB;  Service: Cardiovascular;  Laterality: N/A;  . TEE WITHOUT CARDIOVERSION N/A 01/20/2015   Procedure: TRANSESOPHAGEAL ECHOCARDIOGRAM (TEE);  Surgeon: Fay Records, MD;  Location: [place];  Service: Cardiovascular;  Laterality: N/A;  . TUBAL LIGATION  Current Medications: Current Meds  Medication Sig  . acetaminophen (TYLENOL) 650 MG CR tablet Take by mouth.  Marland Kitchen amLODipine (NORVASC) 10 MG tablet Take 10 mg by mouth daily.  Marland Kitchen aspirin EC 81 MG tablet Take 81 mg by mouth daily.  . insulin glargine (LANTUS) 100 UNIT/ML injection Inject 9 Units into the skin 2 (two) times daily.  . insulin lispro (HUMALOG) 100 UNIT/ML KiwkPen Inject into the skin. Take 4 units in the skin in the a.m Take 6 units in the skin at lunch Take 8  units in the skin at supper  . latanoprost (XALATAN) 0.005 % ophthalmic solution Place 1 drop into both eyes at bedtime.  . mycophenolate (CELLCEPT) 250 MG capsule Take 750 mg by mouth.   . nitroGLYCERIN (NITROSTAT) 0.4 MG SL tablet Place 0.4 mg under the tongue every 5 (five) minutes as needed for chest pain.  Marland Kitchen NUTRITIONAL SUPPLEMENT LIQD Take 118 mLs by mouth daily.  . predniSONE (DELTASONE) 5 MG tablet Take 5 mg by mouth daily with breakfast.   . ranitidine (ZANTAC) 150 MG tablet Take 150 mg by mouth daily.   . sertraline (ZOLOFT) 100 MG tablet Take 100 mg by mouth daily.   Marland Kitchen sulfamethoxazole-trimethoprim (BACTRIM DS,SEPTRA DS) 800-160 MG tablet Take by mouth 3 (three) times a week. Monday, wednesdays, & fridays  . Tacrolimus (ENVARSUS XR) 1 MG TB24 Take 3 mg by mouth daily.   Marland Kitchen umeclidinium-vilanterol (ANORO ELLIPTA) 62.5-25 MCG/INH AEPB Inhale 1 puff into the lungs daily.   . valGANciclovir (VALCYTE) 450 MG tablet Take 450 mg by mouth daily.      Allergies:   Patient has no known allergies.   Social History   Socioeconomic History  . Marital status: Married    Spouse name: Not on file  . Number of children: Not on file  . Years of education: Not on file  . Highest education level: Not on file  Occupational History  . Not on file  Social Needs  . Financial resource strain: Not on file  . Food insecurity    Worry: Not on file    Inability: Not on file  . Transportation needs    Medical: Not on file    Non-medical: Not on file  Tobacco Use  . Smoking status: Former Smoker    Packs/day: 1.50    Types: Cigarettes    Quit date: 10/29/1992    Years since quitting: 26.2  . Smokeless tobacco: Never Used  Substance and Sexual Activity  . Alcohol use: No    Alcohol/week: 0.0 standard drinks  . Drug use: No  . Sexual activity: Not Currently  Lifestyle  . Physical activity    Days per week: Not on file    Minutes per session: Not on file  . Stress: Not on file   Relationships  . Social Musician on phone: Not on file    Gets together: Not on file    Attends religious service: Not on file    Active member of club or organization: Not on file    Attends meetings of clubs or organizations: Not on file    Relationship status: Not on file  Other Topics Concern  . Not on file  Social History Narrative  . Not on file     Family History: The patient's family history includes Breast cancer in her sister; Cancer in her father and sister; Diabetes in her father and sister; Heart attack in her mother; Hypertension in her  father, mother, sister, and sister.  ROS:   Please see the history of present illness.    Appetite is been good.  She has a tremor related to tacrolimus.  Blood sugars have been high on prednisone.  All other systems reviewed and are negative.  EKGs/Labs/Other Studies Reviewed:    The following studies were reviewed today: [facility]  Pretransplant LVEF 40%.  2D Doppler echocardiogram 2017: Study Conclusions  - Left ventricle: The cavity size was normal. Wall thickness was   normal. Systolic function was moderately reduced. The estimated   ejection fraction was in the range of 35% to 40%. Diffuse   hypokinesis. Doppler parameters are consistent with abnormal left   ventricular relaxation (grade 1 diastolic dysfunction). - Aortic valve: There was no stenosis. - Mitral valve: There was trivial regurgitation. - Right ventricle: The cavity size was normal. Systolic function   was normal. - Pulmonary arteries: No complete TR doppler jet so unable to   estimate PA systolic pressure. - Inferior vena cava: The vessel was normal in size. The   respirophasic diameter changes were in the normal range (>= 50%),   consistent with normal central venous pressure. - Pericardium, extracardiac: A trivial pericardial effusion was   identified.  Impressions:   - Normal LV size with EF 35-40%, diffuse hypokinesis. Normal RV   size and systolic function. No significant valvular   abnormalities.  EKG:  EKG performed on January 20, 2019 demonstrates normal sinus rhythm, normal PR interval, poor R wave progression, nonspecific ST-T wave abnormality, and vertical QRS axis.  When compared to prior tracings, no significant findings/changes compared to 11/23/2017  Recent Labs: No results found for requested labs within last 8760 hours.  Recent Lipid Panel    Component Value Date/Time   TRIG 102 01/20/2015 0908    Physical Exam:    VS:  BP (!) 164/62   Pulse 81   Ht 5' 1.5" (1.562 m)   Wt 152 lb 9.6 oz (69.2 kg)   SpO2 96%   BMI 28.37 kg/m     Wt Readings from Last 3 Encounters:  01/20/19 152 lb 9.6 oz (69.2 kg)  02/10/18 114 lb 9.6 oz (52 kg)  11/24/17 121 lb 4.1 oz (55 kg)     GEN: Skin color is good.. No acute distress HEENT: Normal NECK: No JVD. LYMPHATICS: No lymphadenopathy CARDIAC:  RRR without murmur, gallop, or edema. VASCULAR:  Normal Pulses. No bruits. RESPIRATORY:  Clear to auscultation without rales, wheezing or rhonchi  ABDOMEN: Soft, non-tender, non-distended, No pulsatile mass, MUSCULOSKELETAL: No deformity  SKIN: Warm and dry NEUROLOGIC:  Alert and oriented x 3 PSYCHIATRIC:  Normal affect   ASSESSMENT:    1. CAD in native artery   2. Chronic systolic heart failure (HCC)   3. Essential hypertension   4. 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 Other emphysema (HCC)   6. Educated About Covid-19 Virus Infection    PLAN:    In order of problems listed above:  1. Secondary prevention discussed. 2. No evidence of volume overload.  Post recent LVEF was 40% prior to kidney transplant.  2D Doppler echocardiogram will be repeated. 3. Blood pressure is excellent. 4. A1c should be less than 7. 5. She is no longer smoking. 6.  Masking, handwashing, and social distancing is recommended.  Overall education and awareness concerning primary/secondary risk prevention  was discussed in detail: LDL less than 70, hemoglobin A1c less than 7, blood pressure target less than 130/80 mmHg, >150 minutes of moderate aerobic activity per week, avoidance of smoking, weight control (via diet and exercise), and continued surveillance/management of/for obstructive sleep apnea.    Medication Adjustments/Labs and Tests Ordered: Current medicines are reviewed at length with the patient today.  Concerns regarding medicines are outlined above.  No orders of the defined types were placed in this encounter.  No orders of the defined types were placed in this encounter.   There are no Patient Instructions on file for this visit.   Signed, Lesleigh NoeHenry W  III, MD  01/20/2019 10:08 AM    [facility]

## 2019-01-20 ENCOUNTER — Other Ambulatory Visit: Payer: Self-pay

## 2019-01-20 ENCOUNTER — Ambulatory Visit (INDEPENDENT_AMBULATORY_CARE_PROVIDER_SITE_OTHER): Payer: Medicare Other | Admitting: Interventional Cardiology

## 2019-01-20 ENCOUNTER — Encounter: Payer: Self-pay | Admitting: Interventional Cardiology

## 2019-01-20 VITALS — BP 164/62 | HR 81 | Ht 61.5 in | Wt 152.6 lb

## 2019-01-20 DIAGNOSIS — I5022 Chronic systolic (congestive) heart failure: Secondary | ICD-10-CM | POA: Diagnosis not present

## 2019-01-20 DIAGNOSIS — J438 Other emphysema: Secondary | ICD-10-CM

## 2019-01-20 DIAGNOSIS — Z7189 Other specified counseling: Secondary | ICD-10-CM

## 2019-01-20 DIAGNOSIS — E0859 Diabetes mellitus due to underlying condition with other circulatory complications: Secondary | ICD-10-CM

## 2019-01-20 DIAGNOSIS — I2510 Atherosclerotic heart disease of native coronary artery without angina pectoris: Secondary | ICD-10-CM

## 2019-01-20 DIAGNOSIS — Z794 Long term (current) use of insulin: Secondary | ICD-10-CM

## 2019-01-20 DIAGNOSIS — I10 Essential (primary) hypertension: Secondary | ICD-10-CM | POA: Diagnosis not present

## 2019-01-20 NOTE — Patient Instructions (Signed)

## 2019-01-26 ENCOUNTER — Ambulatory Visit (HOSPITAL_COMMUNITY): Payer: Medicare Other | Attending: Interventional Cardiology

## 2019-01-26 ENCOUNTER — Other Ambulatory Visit: Payer: Self-pay

## 2019-01-26 DIAGNOSIS — I5022 Chronic systolic (congestive) heart failure: Secondary | ICD-10-CM

## 2019-05-25 ENCOUNTER — Telehealth: Payer: Self-pay | Admitting: Interventional Cardiology

## 2019-05-25 NOTE — Telephone Encounter (Signed)
Pt calling requesting a refill on Carvedilol 25 mg tablet pt taking 1/2 tablet BID. This medication is no longer on pt's medication list. Pt states that [facility] started her back on this medication and she would like Dr. Katrinka Blazing to start refilling this medication for her. Pt would like for her medication to be sent to [facility] mail order pharmacy. Pt would like a call back concerning this matter. Please address

## 2019-05-25 NOTE — Telephone Encounter (Signed)
I do see in Care Everywhere at pt's last OV at [facility] in August, they have Carvedilol 12.5mg  BID listed on meds.  Will get the ok from Dr. Katrinka Blazing to fill.

## 2019-05-26 MED ORDER — CARVEDILOL 12.5 MG PO TABS: 12.5000 mg | ORAL_TABLET | Freq: Two times a day (BID) | ORAL | 3 refills | Status: DC

## 2019-05-26 NOTE — Telephone Encounter (Signed)
Ok

## 2019-05-26 NOTE — Telephone Encounter (Signed)
Prescription sent to [facility] 

## 2019-06-08 ENCOUNTER — Other Ambulatory Visit: Payer: Self-pay | Admitting: Internal Medicine

## 2019-06-08 DIAGNOSIS — Z1231 Encounter for screening mammogram for malignant neoplasm of breast: Secondary | ICD-10-CM

## 2019-06-10 ENCOUNTER — Ambulatory Visit: Payer: Medicare Other

## 2019-06-19 ENCOUNTER — Ambulatory Visit: Payer: Medicare Other | Attending: Internal Medicine

## 2019-06-19 DIAGNOSIS — Z23 Encounter for immunization: Secondary | ICD-10-CM | POA: Insufficient documentation

## 2019-06-19 NOTE — Progress Notes (Signed)
   Covid-19 Vaccination Clinic  Name:  Pamela [name]    MRN: [id number] DOB: 08/16/1950  06/19/2019  [name] was observed post Covid-19 immunization for 15 minutes without incidence. She was provided with Vaccine Information Sheet and instruction to access the V-Safe system.   [name] was instructed to call 911 with any severe reactions post vaccine: Marland Kitchen Difficulty breathing  . Swelling of your face and throat  . A fast heartbeat  . A bad rash all over your body  . Dizziness and weakness    Immunizations Administered    Name Date Dose VIS Date Route   Pfizer COVID-19 Vaccine 06/19/2019  8:43 AM 0.3 mL 04/23/2019 Intramuscular   Manufacturer: [employer], [employer]   Lot: [id number]   NDC: [address]-2

## 2019-07-13 ENCOUNTER — Ambulatory Visit: Payer: Medicare Other | Attending: Internal Medicine

## 2019-07-13 ENCOUNTER — Ambulatory Visit: Payer: Medicare Other

## 2019-07-13 DIAGNOSIS — Z23 Encounter for immunization: Secondary | ICD-10-CM

## 2019-07-13 NOTE — Progress Notes (Signed)
   Covid-19 Vaccination Clinic  Name:  Pamela [name]    MRN: [id number] DOB: [birth year]  07/13/2019  Pamela [name] was observed post Covid-19 immunization for 15 minutes without incident. She was provided with Vaccine Information Sheet and instruction to access the V-Safe system.   Pamela [name] was instructed to call 911 with any severe reactions post vaccine: Marland Kitchen Difficulty breathing  . Swelling of face and throat  . A fast heartbeat  . A bad rash all over body  . Dizziness and weakness   Immunizations Administered    Name Date Dose VIS Date Route   Pfizer COVID-19 Vaccine 07/13/2019  8:18 AM 0.3 mL 04/23/2019 Intramuscular   Manufacturer: [employer], [employer]   Lot: [id number]   NDC: [address]-1

## 2019-07-16 ENCOUNTER — Ambulatory Visit: Payer: Medicare Other

## 2019-08-11 ENCOUNTER — Other Ambulatory Visit: Payer: Self-pay | Admitting: Internal Medicine

## 2019-08-11 DIAGNOSIS — M8580 Other specified disorders of bone density and structure, unspecified site: Secondary | ICD-10-CM

## 2019-08-17 ENCOUNTER — Ambulatory Visit
Admission: RE | Admit: 2019-08-17 | Discharge: 2019-08-17 | Disposition: A | Discharge status: Home / Self Care | Payer: Medicare Other | Source: Ambulatory Visit | Attending: Internal Medicine | Admitting: Internal Medicine

## 2019-08-17 ENCOUNTER — Other Ambulatory Visit: Payer: Self-pay

## 2019-08-17 DIAGNOSIS — Z1231 Encounter for screening mammogram for malignant neoplasm of breast: Secondary | ICD-10-CM

## 2019-09-30 ENCOUNTER — Other Ambulatory Visit: Payer: Self-pay

## 2019-09-30 ENCOUNTER — Ambulatory Visit
Admission: RE | Admit: 2019-09-30 | Discharge: 2019-09-30 | Disposition: A | Discharge status: Home / Self Care | Payer: Medicare Other | Source: Ambulatory Visit | Attending: Internal Medicine | Admitting: Internal Medicine

## 2019-09-30 DIAGNOSIS — M8580 Other specified disorders of bone density and structure, unspecified site: Secondary | ICD-10-CM

## 2020-01-04 ENCOUNTER — Ambulatory Visit: Payer: Medicare Other | Attending: Internal Medicine

## 2020-01-04 DIAGNOSIS — Z23 Encounter for immunization: Secondary | ICD-10-CM

## 2020-01-04 NOTE — Progress Notes (Signed)
   Covid-19 Vaccination Clinic  Name:  Pamela [name]    MRN: [id number] DOB: 10-17-1950  01/04/2020  [name] was observed post Covid-19 immunization for 15 minutes without incident. She was provided with Vaccine Information Sheet and instruction to access the V-Safe system.   [name] was instructed to call 911 with any severe reactions post vaccine: Marland Kitchen Difficulty breathing  . Swelling of face and throat  . A fast heartbeat  . A bad rash all over body  . Dizziness and weakness

## 2020-03-22 ENCOUNTER — Other Ambulatory Visit: Payer: Self-pay | Admitting: Interventional Cardiology

## 2020-05-28 ENCOUNTER — Other Ambulatory Visit: Payer: Self-pay | Admitting: Interventional Cardiology

## 2020-05-31 ENCOUNTER — Other Ambulatory Visit: Payer: Self-pay

## 2020-06-06 NOTE — Progress Notes (Signed)
Cardiology Office Note:    Date:  06/08/2020   ID:  Pamela [name], DOB [birth year], MRN [id number]  PCP:  Georgann Housekeeper, MD  Cardiologist:  Lesleigh Noe, MD   Referring MD: Georgann Housekeeper, MD   Chief Complaint  Patient presents with  . Coronary Artery Disease  . Hypertension    History of Present Illness:    Pamela [name] is a 70 y.o. 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  She has now 2 years post kidney transplant.  She is doing well.  Followed at [facility].  She denies orthopnea, PND, angina.  She is not using sublingual nitroglycerin.  There is no peripheral edema.  She denies palpitations.  Following a low carbohydrate diet.  She is on prednisone which tends to drive her blood sugars off.  Past Medical History:  Diagnosis Date  . Anemia   . Anxiety   . CHF (congestive heart failure) (HCC)   . Chronic kidney disease   . COPD (chronic obstructive pulmonary disease) (HCC)   . Coronary artery disease   . Diabetes mellitus without complication (HCC) 10-29-12  . Headache   . Hypertension   . PONV (postoperative nausea and vomiting)   . Shortness of breath dyspnea     Past Surgical History:  Procedure Laterality Date  . ABDOMINAL HYSTERECTOMY    . ANAL FISSURE REPAIR    . APPENDECTOMY     '74- open with gallbladder  . AV FISTULA PLACEMENT Left 01/31/2015   Procedure: ARTERIOVENOUS FISTULA CREATION-LEFT BRACHIO-CEPHALIC ;  Surgeon: Fransisco Hertz, MD;  Location: [facility] OR;  Service: Vascular;  Laterality: Left;  . AV FISTULA PLACEMENT Left 04/04/2015   Procedure: INSERTION OF ARTERIOVENOUS (AV) GORE-TEX GRAFT ARM;  Surgeon: Sherren Kerns, MD;  Location: [facility] OR;  Service: Vascular;   Laterality: Left;  . BREAST EXCISIONAL BIOPSY Left   . BREAST SURGERY     cyst removed  . CARDIAC CATHETERIZATION     1 coronary stent placed  . CHOLECYSTECTOMY     '74-open  . COLONOSCOPY WITH PROPOFOL N/A 11/17/2012   Procedure: COLONOSCOPY WITH PROPOFOL;  Surgeon: Charolett Bumpers, MD;  Location: [facility] ENDOSCOPY;  Service: Endoscopy;  Laterality: N/A;  . CORONARY STENT PLACEMENT    . ELBOW SURGERY Right    tendon surgery  . ESOPHAGOGASTRODUODENOSCOPY (EGD) WITH PROPOFOL N/A 11/17/2012   Procedure: ESOPHAGOGASTRODUODENOSCOPY (EGD) WITH PROPOFOL;  Surgeon: Charolett Bumpers, MD;  Location: [facility] ENDOSCOPY;  Service: Endoscopy;  Laterality: N/A;  . GANGLION CYST EXCISION Bilateral 10-29-12   wrist  . LEFT HEART CATHETERIZATION WITH CORONARY ANGIOGRAM N/A 04/14/2014   Procedure: LEFT HEART CATHETERIZATION WITH CORONARY ANGIOGRAM;  Surgeon: Lesleigh Noe, MD;  Location: [facility] CATH LAB;  Service: Cardiovascular;  Laterality: N/A;  . PERIPHERAL VASCULAR CATHETERIZATION N/A 03/09/2015   Procedure: Fistulagram;  Surgeon: Fransisco Hertz, MD;  Location: [facility] INVASIVE CV LAB;  Service: Cardiovascular;  Laterality: N/A;  . TEE WITHOUT CARDIOVERSION N/A 01/20/2015   Procedure: TRANSESOPHAGEAL ECHOCARDIOGRAM (TEE);  Surgeon: Pricilla Riffle, MD;  Location: [facility] ENDOSCOPY;  Service: Cardiovascular;  Laterality: N/A;  . TUBAL LIGATION      Current Medications: Current Meds  Medication Sig  . acetaminophen (TYLENOL) 650 MG CR tablet Take by mouth.  Marland Kitchen  amLODipine (NORVASC) 10 MG tablet Take 10 mg by mouth daily.  Marland Kitchen aspirin EC 81 MG tablet Take 81 mg by mouth daily.  . insulin glargine (LANTUS) 100 UNIT/ML injection Inject 9 Units into the skin 2 (two) times daily.  . insulin lispro (HUMALOG) 100 UNIT/ML KiwkPen Inject into the skin. Take 4 units in the skin in the a.m Take 6 units in the skin at lunch Take 8 units in the skin at supper  . latanoprost (XALATAN) 0.005 % ophthalmic solution Place 1 drop into both eyes at  bedtime.  . mycophenolate (CELLCEPT) 250 MG capsule Take 750 mg by mouth.   . nitroGLYCERIN (NITROSTAT) 0.4 MG SL tablet Place 0.4 mg under the tongue every 5 (five) minutes as needed for chest pain.  . predniSONE (DELTASONE) 5 MG tablet Take 5 mg by mouth daily with breakfast.   . Tacrolimus 1 MG TB24 Take 3 mg by mouth daily.   Marland Kitchen umeclidinium-vilanterol (ANORO ELLIPTA) 62.5-25 MCG/INH AEPB Inhale 1 puff into the lungs daily.   . [DISCONTINUED] carvedilol (COREG) 12.5 MG tablet Take 1 tablet (12.5 mg total) by mouth 2 (two) times daily. Please keep upcoming appt in January 2022 with Dr. Katrinka Blazing before anymore refills. Thank you     Allergies:   Patient has no known allergies.   Social History   Socioeconomic History  . Marital status: Married    Spouse name: Not on file  . Number of children: Not on file  . Years of education: Not on file  . Highest education level: Not on file  Occupational History  . Not on file  Tobacco Use  . Smoking status: Former Smoker    Packs/day: 1.50    Types: Cigarettes    Quit date: 10/29/1992    Years since quitting: 27.6  . Smokeless tobacco: Never Used  Substance and Sexual Activity  . Alcohol use: No    Alcohol/week: 0.0 standard drinks  . Drug use: No  . Sexual activity: Not Currently  Other Topics Concern  . Not on file  Social History Narrative  . Not on file   Social Determinants of Health   Financial Resource Strain: Not on file  Food Insecurity: Not on file  Transportation Needs: Not on file  Physical Activity: Not on file  Stress: Not on file  Social Connections: Not on file     Family History: The patient's family history includes Breast cancer in her sister; Cancer in her father and sister; Diabetes in her father and sister; Heart attack in her mother; Hypertension in her father, mother, sister, and sister.  ROS:   Please see the history of present illness.    No medication side effects.  No cardiovascular symptoms.  No  neurological symptoms.  She is vaccinated and has been boosted.  She has not been infected with COVID-19.  All other systems reviewed and are negative.  EKGs/Labs/Other Studies Reviewed:    The following studies were reviewed today:  2D Doppler echocardiogram 2020: IMPRESSIONS    1. The left ventricle has normal systolic function, with an ejection  fraction of 55-60%. The cavity size was normal. Left ventricular diastolic  Doppler parameters are consistent with impaired relaxation. Elevated mean  left atrial pressure.  2. The right ventricle has normal systolic function. The cavity was  normal.  3. The mitral valve is grossly normal. There is mild mitral annular  calcification present.  4. The aortic valve is tricuspid. Mild thickening of the aortic valve. No  stenosis of the aortic valve.  5. The aorta is normal unless otherwise noted.  6. Normal LV systolic function; grade 1 diastolic dysfunction.   EKG:  EKG normal sinus rhythm, biatrial abnormality, short PR, nonspecific ST-T wave abnormality.  When compared to the tracing performed in September 2020, changed.  Recent Labs: No results found for requested labs within last 8760 hours.  Recent Lipid Panel    Component Value Date/Time   TRIG 102 01/20/2015 0908    Physical Exam:    VS:  BP (!) 160/90   Pulse 80   Ht 5' 1.5" (1.562 m)   Wt 168 lb 3.2 oz (76.3 kg)   BMI 31.27 kg/m     Wt Readings from Last 3 Encounters:  06/08/20 168 lb 3.2 oz (76.3 kg)  01/20/19 152 lb 9.6 oz (69.2 kg)  02/10/18 114 lb 9.6 oz (52 kg)     GEN: Overweight.. No acute distress HEENT: Normal NECK: No JVD. LYMPHATICS: No lymphadenopathy CARDIAC: No murmur. RRR no gallop, or edema. VASCULAR:  Normal Pulses. No bruits. RESPIRATORY:  Clear to auscultation without rales, wheezing or rhonchi  ABDOMEN: Soft, non-tender, non-distended, No pulsatile mass, MUSCULOSKELETAL: No deformity  SKIN: Warm and dry NEUROLOGIC:  Alert and  oriented x 3 PSYCHIATRIC:  Normal affect   ASSESSMENT:    1. CAD in native artery   2. Chronic systolic heart failure (HCC)   3. Essential hypertension   4. 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 Other emphysema (HCC)   6. Educated about COVID-19 virus infection   7. Mixed hyperlipidemia    PLAN:    In order of problems listed above:  1. Secondary prevention discussed.  Low-sodium diet discussed. 2. Continue carvedilol.  Volume control with transplanted kidney has been much improved. 3. Continue carvedilol, amlodipine, and low-salt diet. 4. A1c is elevated.  Consider SGLT2 therapy.  She is on Lantus and Humalog insulin. 5. Has chronic shortness of breath related to COPD. 6. Vaccinated and boosted.  Practicing social distancing. 7. Continue atorvastatin 10 mg/day.  Overall education and awareness concerning primary/secondary risk prevention was discussed in detail: LDL less than 70, hemoglobin A1c less than 7, blood pressure target less than 130/80 mmHg, >150 minutes of moderate aerobic activity per week, avoidance of smoking, weight control (via diet and exercise), and continued surveillance/management of/for obstructive sleep apnea.  Target BP: <130/80 mmHg  Diet and lifestyle measures for BP control were reviewed in detail: Low sodium diet (<2.5 gm daily); alcohol restriction (<3 ounces per day); weight loss (Mediterranean); avoid non-steroidal agents; > 6 hours sleep per day; 150 min moderate exercise per week. Medical regimen will include at least 2 agents. Resistant hypertension if not controlled on 3 agents. Consider further evaluation: Sleep study to r/o OSA; Renal angiogram; Primary hyperaldonism and Pheochromocytoma w/u. After 3 agents, consider MRA (spironolactone)/ Epleronone), hydralazine, beta-blocker, and Minoxidil if not already in use due to patient profile.   Medication  Adjustments/Labs and Tests Ordered: Current medicines are reviewed at length with the patient today.  Concerns regarding medicines are outlined above.  Orders Placed This Encounter  Procedures  . EKG 12-Lead   Meds ordered this encounter  Medications  . carvedilol (COREG) 12.5 MG tablet    Sig: Take 1 tablet (12.5 mg total) by mouth 2 (two) times daily.    Dispense:  180 tablet    Refill:  3  . atorvastatin (LIPITOR) 10  MG tablet    Sig: Take 1 tablet (10 mg total) by mouth daily.    Dispense:  90 tablet    Refill:  3    Patient Instructions  Medication Instructions:  Your physician recommends that you continue on your current medications as directed. Please refer to the Current Medication list given to you today.  *If you need a refill on your cardiac medications before your next appointment, please call your pharmacy*   Lab Work: None If you have labs (blood work) drawn today and your tests are completely normal, you will receive your results only by: Marland Kitchen MyChart Message (if you have MyChart) OR . A paper copy in the mail If you have any lab test that is abnormal or we need to change your treatment, we will call you to review the results.   Testing/Procedures: None   Follow-Up: At [facility], you and your health needs are our priority.  As part of our continuing mission to provide you with exceptional heart care, we have created designated Provider Care Teams.  These Care Teams include your primary Cardiologist (physician) and Advanced Practice Providers (APPs -  Physician Assistants and Nurse Practitioners) who all work together to provide you with the care you need, when you need it.  We recommend signing up for the patient portal called "MyChart".  Sign up information is provided on this After Visit Summary.  MyChart is used to connect with patients for Virtual Visits (Telemedicine).  Patients are able to view lab/test results, encounter notes, upcoming appointments,  etc.  Non-urgent messages can be sent to your provider as well.   To learn more about what you can do with MyChart, go to [email or web address].    Your next appointment:   1 year(s)  The format for your next appointment:   In Person  Provider:   You may see Lesleigh Noe, MD or one of the following Advanced Practice Providers on your designated Care Team:    Georgie Chard, NP    Other Instructions      Signed, Lesleigh Noe, MD  06/08/2020 9:23 AM    [facility]

## 2020-06-08 ENCOUNTER — Other Ambulatory Visit: Payer: Self-pay | Admitting: *Deleted

## 2020-06-08 ENCOUNTER — Other Ambulatory Visit: Payer: Self-pay

## 2020-06-08 ENCOUNTER — Ambulatory Visit: Payer: Medicare Other | Admitting: Interventional Cardiology

## 2020-06-08 ENCOUNTER — Encounter: Payer: Self-pay | Admitting: Interventional Cardiology

## 2020-06-08 VITALS — BP 160/90 | HR 80 | Ht 61.5 in | Wt 168.2 lb

## 2020-06-08 DIAGNOSIS — I5022 Chronic systolic (congestive) heart failure: Secondary | ICD-10-CM | POA: Diagnosis not present

## 2020-06-08 DIAGNOSIS — I10 Essential (primary) hypertension: Secondary | ICD-10-CM

## 2020-06-08 DIAGNOSIS — Z794 Long term (current) use of insulin: Secondary | ICD-10-CM

## 2020-06-08 DIAGNOSIS — E0859 Diabetes mellitus due to underlying condition with other circulatory complications: Secondary | ICD-10-CM

## 2020-06-08 DIAGNOSIS — I2510 Atherosclerotic heart disease of native coronary artery without angina pectoris: Secondary | ICD-10-CM | POA: Diagnosis not present

## 2020-06-08 DIAGNOSIS — Z7189 Other specified counseling: Secondary | ICD-10-CM

## 2020-06-08 DIAGNOSIS — J438 Other emphysema: Secondary | ICD-10-CM

## 2020-06-08 DIAGNOSIS — E782 Mixed hyperlipidemia: Secondary | ICD-10-CM

## 2020-06-08 MED ORDER — CARVEDILOL 12.5 MG PO TABS: 12.5000 mg | ORAL_TABLET | Freq: Two times a day (BID) | ORAL | 3 refills | Status: DC

## 2020-06-08 MED ORDER — ATORVASTATIN CALCIUM 10 MG PO TABS: 10.0000 mg | ORAL_TABLET | Freq: Every day | ORAL | 3 refills | Status: DC

## 2020-06-08 NOTE — Patient Instructions (Signed)

## 2020-07-19 ENCOUNTER — Other Ambulatory Visit: Payer: Self-pay | Admitting: Internal Medicine

## 2020-07-19 DIAGNOSIS — Z1231 Encounter for screening mammogram for malignant neoplasm of breast: Secondary | ICD-10-CM

## 2020-09-12 ENCOUNTER — Ambulatory Visit
Admission: RE | Admit: 2020-09-12 | Discharge: 2020-09-12 | Disposition: A | Discharge status: Home / Self Care | Payer: Medicare Other | Source: Ambulatory Visit | Attending: Internal Medicine | Admitting: Internal Medicine

## 2020-09-12 ENCOUNTER — Other Ambulatory Visit: Payer: Self-pay

## 2020-09-12 DIAGNOSIS — Z1231 Encounter for screening mammogram for malignant neoplasm of breast: Secondary | ICD-10-CM

## 2020-09-13 ENCOUNTER — Other Ambulatory Visit: Payer: Self-pay | Admitting: Internal Medicine

## 2020-09-13 DIAGNOSIS — R928 Other abnormal and inconclusive findings on diagnostic imaging of breast: Secondary | ICD-10-CM

## 2020-10-02 ENCOUNTER — Ambulatory Visit
Admission: RE | Admit: 2020-10-02 | Discharge: 2020-10-02 | Disposition: A | Discharge status: Home / Self Care | Payer: Medicare Other | Source: Ambulatory Visit | Attending: Internal Medicine | Admitting: Internal Medicine

## 2020-10-02 ENCOUNTER — Ambulatory Visit: Payer: Medicare Other | Attending: Internal Medicine

## 2020-10-02 ENCOUNTER — Encounter (HOSPITAL_COMMUNITY): Payer: Self-pay

## 2020-10-02 ENCOUNTER — Other Ambulatory Visit: Payer: Self-pay

## 2020-10-02 ENCOUNTER — Other Ambulatory Visit (HOSPITAL_BASED_OUTPATIENT_CLINIC_OR_DEPARTMENT_OTHER): Payer: Self-pay

## 2020-10-02 DIAGNOSIS — Z23 Encounter for immunization: Secondary | ICD-10-CM

## 2020-10-02 DIAGNOSIS — R928 Other abnormal and inconclusive findings on diagnostic imaging of breast: Secondary | ICD-10-CM

## 2020-10-02 MED ORDER — PFIZER-BIONT COVID-19 VAC-TRIS 30 MCG/0.3ML IM SUSP
INTRAMUSCULAR | 0 refills | Status: DC
  Filled 2020-10-02: qty 0.3, 1d supply, fill #0

## 2020-10-02 NOTE — Progress Notes (Signed)
   Covid-19 Vaccination Clinic  Name:  Pamela [name]    MRN: [id number] DOB: 10/10/1950  10/02/2020  Ms. Yarbro was observed post Covid-19 immunization for 15 minutes without incident. She was provided with Vaccine Information Sheet and instruction to access the V-Safe system.   Ms. Norment was instructed to call 911 with any severe reactions post vaccine: Marland Kitchen Difficulty breathing  . Swelling of face and throat  . A fast heartbeat  . A bad rash all over body  . Dizziness and weakness   Immunizations Administered    Name Date Dose VIS Date Route   PFIZER Comrnaty(Gray TOP) Covid-19 Vaccine 10/02/2020 12:23 PM 0.3 mL 04/20/2020 Intramuscular   Manufacturer: [employer], [employer]   Lot: [id number]   NDC: [id number]

## 2021-01-25 ENCOUNTER — Other Ambulatory Visit (HOSPITAL_BASED_OUTPATIENT_CLINIC_OR_DEPARTMENT_OTHER): Payer: Self-pay

## 2021-01-30 ENCOUNTER — Other Ambulatory Visit (HOSPITAL_BASED_OUTPATIENT_CLINIC_OR_DEPARTMENT_OTHER): Payer: Self-pay

## 2021-01-30 ENCOUNTER — Other Ambulatory Visit: Payer: Self-pay

## 2021-01-30 ENCOUNTER — Ambulatory Visit: Payer: Medicare Other | Attending: Internal Medicine

## 2021-01-30 DIAGNOSIS — Z23 Encounter for immunization: Secondary | ICD-10-CM

## 2021-01-30 MED ORDER — PFIZER COVID-19 VAC BIVALENT 30 MCG/0.3ML IM SUSP
INTRAMUSCULAR | 0 refills | Status: DC
  Filled 2021-01-30: qty 0.3, 1d supply, fill #0

## 2021-01-30 MED ORDER — INFLUENZA VAC A&B SA ADJ QUAD 0.5 ML IM PRSY
PREFILLED_SYRINGE | INTRAMUSCULAR | 0 refills | Status: DC
  Filled 2021-01-30: qty 0.5, 1d supply, fill #0

## 2021-01-30 NOTE — Progress Notes (Addendum)
   Covid-19 Vaccination Clinic  Name:  Pamela [name]    MRN: [id number] DOB: [birth year]  01/30/2021  [name] was observed post Covid-19 immunization for 15 minutes without incident. She was provided with Vaccine Information Sheet and instruction to access the V-Safe system.   [name] was instructed to call 911 with any severe reactions post vaccine: Difficulty breathing  Swelling of face and throat  A fast heartbeat  A bad rash all over body  Dizziness and weakness   Immunizations Administered     Name Date Dose VIS Date Route   Pfizer Covid-19 Vaccine Bivalent Booster 01/30/2021 10:49 AM 0.3 mL 01/10/2021 Intramuscular   Manufacturer: [employer], [employer]   Lot: [id number]   NDC: [id number]

## 2021-05-25 ENCOUNTER — Ambulatory Visit: Payer: Medicare Other | Admitting: Interventional Cardiology

## 2021-05-25 ENCOUNTER — Encounter: Payer: Self-pay | Admitting: Interventional Cardiology

## 2021-05-25 ENCOUNTER — Other Ambulatory Visit: Payer: Self-pay

## 2021-05-25 VITALS — BP 138/68 | HR 77 | Ht 61.5 in | Wt 170.4 lb

## 2021-05-25 DIAGNOSIS — Z794 Long term (current) use of insulin: Secondary | ICD-10-CM

## 2021-05-25 DIAGNOSIS — I10 Essential (primary) hypertension: Secondary | ICD-10-CM

## 2021-05-25 DIAGNOSIS — I2510 Atherosclerotic heart disease of native coronary artery without angina pectoris: Secondary | ICD-10-CM | POA: Diagnosis not present

## 2021-05-25 DIAGNOSIS — E0859 Diabetes mellitus due to underlying condition with other circulatory complications: Secondary | ICD-10-CM | POA: Diagnosis not present

## 2021-05-25 DIAGNOSIS — E782 Mixed hyperlipidemia: Secondary | ICD-10-CM

## 2021-05-25 DIAGNOSIS — I5022 Chronic systolic (congestive) heart failure: Secondary | ICD-10-CM

## 2021-05-25 NOTE — Patient Instructions (Signed)

## 2021-05-25 NOTE — Progress Notes (Signed)
Cardiology Office Note:    Date:  05/25/2021   ID:  Pamela [name], DOB Feb 11, 1951, MRN [id number]  PCP:  Georgann Housekeeper, MD  Cardiologist:  Lesleigh Noe, MD   Referring MD: Georgann Housekeeper, MD   No chief complaint on file.   History of Present Illness:    Pamela [name] is a 71 y.o. 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  Successful renal transplant December 15, 2017.   Pamela [name] denies chest pain, orthopnea, PND, and requirement for nitroglycerin.  She is relatively sedentary.  She is gaining weight.  She sleeps well.  She denies claudication.  Past Medical History:  Diagnosis Date   Anemia    Anxiety    CHF (congestive heart failure) (HCC)    Chronic kidney disease    COPD (chronic obstructive pulmonary disease) (HCC)    Coronary artery disease    Diabetes mellitus without complication (HCC) 10-29-12   Headache    Hypertension    PONV (postoperative nausea and vomiting)    Shortness of breath dyspnea     Past Surgical History:  Procedure Laterality Date   ABDOMINAL HYSTERECTOMY     ANAL FISSURE REPAIR     APPENDECTOMY     '74- open with gallbladder   AV FISTULA PLACEMENT Left 01/31/2015   Procedure: ARTERIOVENOUS FISTULA CREATION-LEFT BRACHIO-CEPHALIC ;  Surgeon: Fransisco Hertz, MD;  Location: [facility] OR;  Service: Vascular;  Laterality: Left;   AV FISTULA PLACEMENT Left 04/04/2015   Procedure: INSERTION OF ARTERIOVENOUS (AV) GORE-TEX GRAFT ARM;  Surgeon: Sherren Kerns, MD;  Location: [facility] OR;  Service: Vascular;  Laterality: Left;   BREAST EXCISIONAL BIOPSY Left    BREAST SURGERY     cyst removed   CARDIAC CATHETERIZATION     1 coronary stent placed   CHOLECYSTECTOMY     '74-open   COLONOSCOPY WITH PROPOFOL N/A 11/17/2012   Procedure: COLONOSCOPY  WITH PROPOFOL;  Surgeon: Charolett Bumpers, MD;  Location: [facility] ENDOSCOPY;  Service: Endoscopy;  Laterality: N/A;   CORONARY STENT PLACEMENT     ELBOW SURGERY Right    tendon surgery   ESOPHAGOGASTRODUODENOSCOPY (EGD) WITH PROPOFOL N/A 11/17/2012   Procedure: ESOPHAGOGASTRODUODENOSCOPY (EGD) WITH PROPOFOL;  Surgeon: Charolett Bumpers, MD;  Location: [facility] ENDOSCOPY;  Service: Endoscopy;  Laterality: N/A;   GANGLION CYST EXCISION Bilateral 10-29-12   wrist   LEFT HEART CATHETERIZATION WITH CORONARY ANGIOGRAM N/A 04/14/2014   Procedure: LEFT HEART CATHETERIZATION WITH CORONARY ANGIOGRAM;  Surgeon: Lesleigh Noe, MD;  Location: [facility] CATH LAB;  Service: Cardiovascular;  Laterality: N/A;   PERIPHERAL VASCULAR CATHETERIZATION N/A 03/09/2015   Procedure: Fistulagram;  Surgeon: Fransisco Hertz, MD;  Location: [facility] INVASIVE CV LAB;  Service: Cardiovascular;  Laterality: N/A;   TEE WITHOUT CARDIOVERSION N/A 01/20/2015   Procedure: TRANSESOPHAGEAL ECHOCARDIOGRAM (TEE);  Surgeon: Pricilla Riffle, MD;  Location: [facility] ENDOSCOPY;  Service: Cardiovascular;  Laterality: N/A;   TUBAL LIGATION      Current Medications: Current Meds  Medication Sig   Accu-Chek Softclix Lancets lancets DX: E11.21 as directed   acetaminophen (TYLENOL) 650 MG CR tablet Take by mouth.   albuterol (VENTOLIN HFA) 108 (90 Base) MCG/ACT inhaler 1 puff as needed.   amLODipine (NORVASC) 10 MG tablet Take 10 mg by mouth daily.  aspirin EC 81 MG tablet Take 81 mg by mouth daily.   atorvastatin (LIPITOR) 10 MG tablet Take 1 tablet (10 mg total) by mouth daily.   Blood Glucose Calibration (ACCU-CHEK AVIVA) SOLN DX: E11.21 as directed   carvedilol (COREG) 12.5 MG tablet Take 1 tablet (12.5 mg total) by mouth 2 (two) times daily.   COVID-19 mRNA bivalent vaccine, Pfizer, (PFIZER COVID-19 VAC BIVALENT) injection Inject into the muscle.   COVID-19 mRNA Vac-TriS, Pfizer, (PFIZER-BIONT COVID-19 VAC-TRIS) SUSP injection Inject into the muscle.   glucose blood  (ACCU-CHEK AVIVA PLUS) test strip use as directed to check  blood sugar 4 times daily   influenza vaccine adjuvanted (FLUAD) 0.5 ML injection Inject into the muscle.   insulin glargine (LANTUS) 100 UNIT/ML injection Inject 15 Units into the skin 2 (two) times daily.   insulin lispro (HUMALOG) 100 UNIT/ML KiwkPen Inject into the skin. Take 4 units in the skin in the a.m Take 6 units in the skin at lunch Take 8 units in the skin at supper   latanoprost (XALATAN) 0.005 % ophthalmic solution Place 1 drop into both eyes at bedtime.   mycophenolate (CELLCEPT) 250 MG capsule Take 750 mg by mouth.    nitroGLYCERIN (NITROSTAT) 0.4 MG SL tablet Place 0.4 mg under the tongue every 5 (five) minutes as needed for chest pain.   predniSONE (DELTASONE) 5 MG tablet Take 5 mg by mouth daily with breakfast.    Tacrolimus 1 MG TB24 Take 1 mg by mouth daily.   tacrolimus ER (ENVARSUS XR) 1 MG TB24 Take 1 mg by mouth daily.   umeclidinium-vilanterol (ANORO ELLIPTA) 62.5-25 MCG/INH AEPB Inhale 1 puff into the lungs daily.      Allergies:   Patient has no known allergies.   Social History   Socioeconomic History   Marital status: Married    Spouse name: Not on file   Number of children: Not on file   Years of education: Not on file   Highest education level: Not on file  Occupational History   Not on file  Tobacco Use   Smoking status: Former    Packs/day: 1.50    Types: Cigarettes    Quit date: 10/29/1992    Years since quitting: 28.5   Smokeless tobacco: Never  Substance and Sexual Activity   Alcohol use: No    Alcohol/week: 0.0 standard drinks   Drug use: No   Sexual activity: Not Currently  Other Topics Concern   Not on file  Social History Narrative   Not on file   Social Determinants of Health   Financial Resource Strain: Not on file  Food Insecurity: Not on file  Transportation Needs: Not on file  Physical Activity: Not on file  Stress: Not on file  Social Connections: Not on file      Family History: The patient's family history includes Breast cancer in her sister; Cancer in her father and sister; Diabetes in her father and sister; Heart attack in her mother; Hypertension in her father, mother, sister, and sister.  ROS:   Please see the history of present illness.    She does have dyspnea on exertion.  All other systems reviewed and are negative.  EKGs/Labs/Other Studies Reviewed:    The following studies were reviewed today: 2D Doppler echocardiogram 01/26/2019: IMPRESSIONS     1. The left ventricle has normal systolic function, with an ejection  fraction of 55-60%. The cavity size was normal. Left ventricular diastolic  Doppler parameters are consistent with  impaired relaxation. Elevated mean  left atrial pressure.   2. The right ventricle has normal systolic function. The cavity was  normal.   3. The mitral valve is grossly normal. There is mild mitral annular  calcification present.   4. The aortic valve is tricuspid. Mild thickening of the aortic valve. No  stenosis of the aortic valve.   5. The aorta is normal unless otherwise noted.   6. Normal LV systolic function; grade 1 diastolic dysfunction.  EKG:  EKG normal sinus rhythm with vertical axis.  Otherwise normal.  When compared to January 2022, no changes noted.  Recent Labs: No results found for requested labs within last 8760 hours.  Recent Lipid Panel    Component Value Date/Time   TRIG 102 01/20/2015 0908    Physical Exam:    VS:  BP 138/68    Pulse 77    Ht 5' 1.5" (1.562 m)    Wt 170 lb 6 oz (77.3 kg)    SpO2 97%    BMI 31.67 kg/m     Wt Readings from Last 3 Encounters:  05/25/21 170 lb 6 oz (77.3 kg)  06/08/20 168 lb 3.2 oz (76.3 kg)  01/20/19 152 lb 9.6 oz (69.2 kg)     GEN: Overweight with weight increasing now up to 168 pounds.. No acute distress HEENT: Normal NECK: No JVD. LYMPHATICS: No lymphadenopathy CARDIAC: No murmur. RRR no gallop, or edema. VASCULAR:  Normal  Pulses. No bruits. RESPIRATORY:  Clear to auscultation without rales, wheezing or rhonchi  ABDOMEN: Soft, non-tender, non-distended, No pulsatile mass, MUSCULOSKELETAL: No deformity  SKIN: Warm and dry NEUROLOGIC:  Alert and oriented x 3 PSYCHIATRIC:  Normal affect   ASSESSMENT:    1. CAD in native artery   2. Chronic systolic heart failure (HCC)   3. Essential hypertension   4. 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 Mixed hyperlipidemia    PLAN:    In order of problems listed above:  Stable without angina.  Secondary prevention discussed. Prior history of systolic heart failure is resolved.  Most recent EF is greater than 50%.  See above. Excellent blood pressure control Hemoglobin A1c is greater than 8. Continue statin therapy.  Most recent LDL was less than 70.  Overall education and awareness concerning secondary risk prevention was discussed in detail: LDL less than 70, hemoglobin A1c less than 7, blood pressure target less than 130/80 mmHg, >150 minutes of moderate aerobic activity per week, avoidance of smoking, weight control (via diet and exercise), and continued surveillance/management of/for obstructive sleep apnea.    Medication Adjustments/Labs and Tests Ordered: Current medicines are reviewed at length with the patient today.  Concerns regarding medicines are outlined above.  No orders of the defined types were placed in this encounter.  No orders of the defined types were placed in this encounter.   There are no Patient Instructions on file for this visit.   Signed, Lesleigh Noe, MD  05/25/2021 2:37 PM    [facility]

## 2021-08-24 ENCOUNTER — Other Ambulatory Visit: Payer: Self-pay | Admitting: Internal Medicine

## 2021-08-24 DIAGNOSIS — Z1231 Encounter for screening mammogram for malignant neoplasm of breast: Secondary | ICD-10-CM

## 2021-09-13 ENCOUNTER — Ambulatory Visit
Admission: RE | Admit: 2021-09-13 | Discharge: 2021-09-13 | Disposition: A | Discharge status: Home / Self Care | Payer: Medicare Other | Source: Ambulatory Visit | Attending: Internal Medicine | Admitting: Internal Medicine

## 2021-09-13 DIAGNOSIS — Z1231 Encounter for screening mammogram for malignant neoplasm of breast: Secondary | ICD-10-CM

## 2022-08-05 ENCOUNTER — Other Ambulatory Visit: Payer: Self-pay | Admitting: Internal Medicine

## 2022-08-05 DIAGNOSIS — Z0000 Encounter for general adult medical examination without abnormal findings: Secondary | ICD-10-CM

## 2022-09-18 ENCOUNTER — Ambulatory Visit
Admission: RE | Admit: 2022-09-18 | Discharge: 2022-09-18 | Disposition: A | Discharge status: Home / Self Care | Payer: Medicare Other | Source: Ambulatory Visit | Attending: Internal Medicine | Admitting: Internal Medicine

## 2022-09-18 DIAGNOSIS — Z0000 Encounter for general adult medical examination without abnormal findings: Secondary | ICD-10-CM

## 2023-01-30 ENCOUNTER — Other Ambulatory Visit (HOSPITAL_BASED_OUTPATIENT_CLINIC_OR_DEPARTMENT_OTHER): Payer: Self-pay

## 2023-01-30 ENCOUNTER — Encounter (HOSPITAL_COMMUNITY): Payer: Self-pay

## 2023-01-30 MED ORDER — COVID-19 MRNA VAC-TRIS(PFIZER) 30 MCG/0.3ML IM SUSY
0.3000 mL | PREFILLED_SYRINGE | Freq: Once | INTRAMUSCULAR | 0 refills | Status: AC
  Filled 2023-01-30: qty 0.3, 1d supply, fill #0

## 2023-01-30 MED ORDER — INFLUENZA VAC A&B SURF ANT ADJ 0.5 ML IM SUSY
0.5000 mL | PREFILLED_SYRINGE | Freq: Once | INTRAMUSCULAR | 0 refills | Status: AC
  Filled 2023-01-30: qty 0.5, 1d supply, fill #0

## 2023-02-06 ENCOUNTER — Other Ambulatory Visit: Payer: Self-pay | Admitting: Internal Medicine

## 2023-02-06 DIAGNOSIS — M8580 Other specified disorders of bone density and structure, unspecified site: Secondary | ICD-10-CM

## 2023-05-13 ENCOUNTER — Other Ambulatory Visit (HOSPITAL_BASED_OUTPATIENT_CLINIC_OR_DEPARTMENT_OTHER): Payer: Self-pay

## 2023-05-13 MED ORDER — AREXVY 120 MCG/0.5ML IM SUSR
0.5000 mL | Freq: Once | INTRAMUSCULAR | 0 refills | Status: AC
  Filled 2023-05-13: qty 0.5, 1d supply, fill #0

## 2023-05-19 DIAGNOSIS — Z940 Kidney transplant status: Secondary | ICD-10-CM | POA: Diagnosis not present

## 2023-05-19 DIAGNOSIS — Z5181 Encounter for therapeutic drug level monitoring: Secondary | ICD-10-CM | POA: Diagnosis not present

## 2023-05-19 DIAGNOSIS — I10 Essential (primary) hypertension: Secondary | ICD-10-CM | POA: Diagnosis not present

## 2023-05-19 DIAGNOSIS — E1165 Type 2 diabetes mellitus with hyperglycemia: Secondary | ICD-10-CM | POA: Diagnosis not present

## 2023-05-19 DIAGNOSIS — J449 Chronic obstructive pulmonary disease, unspecified: Secondary | ICD-10-CM | POA: Diagnosis not present

## 2023-05-19 DIAGNOSIS — D849 Immunodeficiency, unspecified: Secondary | ICD-10-CM | POA: Diagnosis not present

## 2023-06-10 DIAGNOSIS — Z940 Kidney transplant status: Secondary | ICD-10-CM | POA: Diagnosis not present

## 2023-06-11 DIAGNOSIS — H4322 Crystalline deposits in vitreous body, left eye: Secondary | ICD-10-CM | POA: Diagnosis not present

## 2023-06-11 DIAGNOSIS — H353111 Nonexudative age-related macular degeneration, right eye, early dry stage: Secondary | ICD-10-CM | POA: Diagnosis not present

## 2023-06-11 DIAGNOSIS — E113213 Type 2 diabetes mellitus with mild nonproliferative diabetic retinopathy with macular edema, bilateral: Secondary | ICD-10-CM | POA: Diagnosis not present

## 2023-06-11 DIAGNOSIS — H43812 Vitreous degeneration, left eye: Secondary | ICD-10-CM | POA: Diagnosis not present

## 2023-08-06 DIAGNOSIS — I10 Essential (primary) hypertension: Secondary | ICD-10-CM | POA: Diagnosis not present

## 2023-08-06 DIAGNOSIS — R059 Cough, unspecified: Secondary | ICD-10-CM | POA: Diagnosis not present

## 2023-08-06 DIAGNOSIS — E7800 Pure hypercholesterolemia, unspecified: Secondary | ICD-10-CM | POA: Diagnosis not present

## 2023-08-06 DIAGNOSIS — N182 Chronic kidney disease, stage 2 (mild): Secondary | ICD-10-CM | POA: Diagnosis not present

## 2023-08-06 DIAGNOSIS — R918 Other nonspecific abnormal finding of lung field: Secondary | ICD-10-CM | POA: Diagnosis not present

## 2023-08-06 DIAGNOSIS — Z940 Kidney transplant status: Secondary | ICD-10-CM | POA: Diagnosis not present

## 2023-08-06 DIAGNOSIS — I700 Atherosclerosis of aorta: Secondary | ICD-10-CM | POA: Diagnosis not present

## 2023-08-06 DIAGNOSIS — E1165 Type 2 diabetes mellitus with hyperglycemia: Secondary | ICD-10-CM | POA: Diagnosis not present

## 2023-08-06 DIAGNOSIS — J449 Chronic obstructive pulmonary disease, unspecified: Secondary | ICD-10-CM | POA: Diagnosis not present

## 2023-08-06 DIAGNOSIS — E1121 Type 2 diabetes mellitus with diabetic nephropathy: Secondary | ICD-10-CM | POA: Diagnosis not present

## 2023-08-06 DIAGNOSIS — I5030 Unspecified diastolic (congestive) heart failure: Secondary | ICD-10-CM | POA: Diagnosis not present

## 2023-08-06 DIAGNOSIS — E11319 Type 2 diabetes mellitus with unspecified diabetic retinopathy without macular edema: Secondary | ICD-10-CM | POA: Diagnosis not present

## 2023-08-07 ENCOUNTER — Other Ambulatory Visit (HOSPITAL_COMMUNITY): Payer: Self-pay | Admitting: Internal Medicine

## 2023-08-07 DIAGNOSIS — R918 Other nonspecific abnormal finding of lung field: Secondary | ICD-10-CM

## 2023-08-08 ENCOUNTER — Ambulatory Visit (HOSPITAL_COMMUNITY)
Admission: RE | Admit: 2023-08-08 | Discharge: 2023-08-08 | Disposition: A | Discharge status: Home / Self Care | Source: Ambulatory Visit | Attending: Internal Medicine | Admitting: Internal Medicine

## 2023-08-08 DIAGNOSIS — R590 Localized enlarged lymph nodes: Secondary | ICD-10-CM | POA: Diagnosis not present

## 2023-08-08 DIAGNOSIS — J432 Centrilobular emphysema: Secondary | ICD-10-CM | POA: Diagnosis not present

## 2023-08-08 DIAGNOSIS — R918 Other nonspecific abnormal finding of lung field: Secondary | ICD-10-CM | POA: Insufficient documentation

## 2023-08-08 MED ORDER — IOHEXOL 350 MG/ML SOLN
75.0000 mL | Freq: Once | INTRAVENOUS | Status: AC | PRN
  Administered 2023-08-08: 75 mL via INTRAVENOUS

## 2023-08-12 LAB — POCT I-STAT CREATININE: Creatinine, Ser: 0.8 mg/dL (ref 0.44–1.00)

## 2023-08-15 ENCOUNTER — Encounter: Payer: Self-pay | Admitting: Acute Care

## 2023-08-15 ENCOUNTER — Ambulatory Visit: Admitting: Acute Care

## 2023-08-15 ENCOUNTER — Other Ambulatory Visit: Payer: Self-pay | Admitting: Internal Medicine

## 2023-08-15 ENCOUNTER — Other Ambulatory Visit: Payer: Self-pay

## 2023-08-15 ENCOUNTER — Encounter (HOSPITAL_COMMUNITY): Payer: Self-pay | Admitting: Emergency Medicine

## 2023-08-15 VITALS — BP 143/78 | HR 77 | Ht 61.0 in | Wt 168.8 lb

## 2023-08-15 DIAGNOSIS — Z1231 Encounter for screening mammogram for malignant neoplasm of breast: Secondary | ICD-10-CM

## 2023-08-15 DIAGNOSIS — Z87891 Personal history of nicotine dependence: Secondary | ICD-10-CM | POA: Diagnosis not present

## 2023-08-15 DIAGNOSIS — R918 Other nonspecific abnormal finding of lung field: Secondary | ICD-10-CM

## 2023-08-15 DIAGNOSIS — R042 Hemoptysis: Secondary | ICD-10-CM | POA: Diagnosis not present

## 2023-08-15 NOTE — H&P (View-Only) (Signed)
 History of Present Illness Pamela [name] is a 73 y.o. female former smoker with COPD and a kidney transplant referred , 08/2023 for finding of a lung mass on CXR with hemoptysis. She will be followed by Dr. Delton Coombes.    08/15/2023 Pt. Presents for lung mass consult. She has agreed to the use of the [employer] software during this visit.   Pamela [name] is a 73 year old female with COPD and a history of kidney transplant who presents with hemoptysis and shortness of breath. She was referred by Dr. Daiva Huge for evaluation of a lung mass.  She has been experiencing increased shortness of breath and hemoptysis for a little over two weeks. The hemoptysis is accompanied by a rattling sensation in her chest, prompting her to cough. There is no recent weight loss, and her appetite remains good.  A chest CT scan revealed a large solid mass in the left lower lobe of her lung, obstructing the left main bronchus. This obstruction is likely contributing to her respiratory symptoms. She has not yet seen the scan results herself.  Her past medical history includes COPD, diagnosed in 2004, managed with Anoro and albuterol inhalers. She also has hypertension, diabetes, and a history of kidney disease, having received a kidney transplant in 2018. She is on immunosuppressant medication post-transplant.  She quit smoking in 1994 after a 20-year history of smoking two packs per day. She denies any recent travel and lives with her husband. She is retired and has adult children.  During the review of symptoms, she denies sleep apnea and latex allergy. She confirms daily medication use for diabetes and takes a baby aspirin.  We have reviewed her CT images so she has a good idea of what the lung mass looks like, and have discussed that the next best option in her care is for a bronchoscopy with biopsies. We have discussed the risks and benefits of the procedure. She has given informed consent to move forward with  bronchoscopy with biopsy.Her husband who is here with her is in  agreement with this plan.   Test Results: 08/08/2023 CT Chest   Mediastinum/Nodes: No enlarged mediastinal or supraclavicular nodes. Homogeneous low-density lesion in the LEFT lobe of the thyroid gland measures 1.8 cm. No axillary adenopathy.   Mildly enlarged LEFT hilar node measures 12 mm image 61.   Lungs/Pleura: Large round mass in the LEFT lower lobe surrounds and obstructs the LEFT lower lobe bronchus. Mass measures 6.8 by 5.9 cm (image 61/3) and is solid. Centrilobular emphysema the upper lobes. 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 Mass surrounds and obstructs the LEFT lower lobe bronchus. 2. Mildly enlarged LEFT hilar node. 3. No evidence of metastatic adenopathy in the mediastinum. 4. No evidence of metastatic disease in the upper abdomen. 5. Benign appearing low-density lesion in the LEFT lobe of the thyroid gland. No specific follow-up recommended  PMH - History of hypertension - History of kidney disease - History of COPD since 2004 - History of shortness of breath - History of diabetes     Latest Ref Rng & Units 11/24/2017    8:10 AM 11/24/2017    6:05 AM 11/23/2017   10:14 AM  CBC  WBC 4.0 - 10.5 K/uL 14.5  15.3  11.4   Hemoglobin 12.0 - 15.0 g/dL 40.9  81.1  7.4   Hematocrit 36.0 - 46.0 % 34.2  35.1  23.6   Platelets 150 - 400 K/uL  249  208  231        Latest Ref Rng & Units 08/08/2023   11:27 AM 11/24/2017    8:10 AM 11/24/2017    6:05 AM  BMP  Glucose 70 - 99 mg/dL  956  213   BUN 8 - 23 mg/dL  73  71   Creatinine 0.86 - 1.00 mg/dL 5.78  4.69  6.29   Sodium 135 - 145 mmol/L  136  136   Potassium 3.5 - 5.1 mmol/L  4.0  4.2   Chloride 98 - 111 mmol/L  93  94   CO2 22 - 32 mmol/L  29  26   Calcium 8.9 - 10.3 mg/dL  9.6  9.6     BNP    Component Value Date/Time   BNP 2,160.6 (H) 01/14/2015 1440    ProBNP    Component Value  Date/Time   PROBNP 284.0 (H) 03/25/2014 0918    PFT    Component Value Date/Time   FEV1PRE 0.75 12/24/2016 1038   FEV1POST 0.79 12/24/2016 1038   FVCPRE 1.58 12/24/2016 1038   FVCPOST 1.66 12/24/2016 1038   TLC 5.09 12/24/2016 1038   DLCOUNC 11.69 12/24/2016 1038   PREFEV1FVCRT 48 12/24/2016 1038   PSTFEV1FVCRT 47 12/24/2016 1038    CT CHEST W CONTRAST Result Date: 08/08/2023 CLINICAL DATA:  LEFT lung mass on chest radiograph EXAM: CT CHEST WITH CONTRAST TECHNIQUE: Multidetector CT imaging of the chest was performed during intravenous contrast administration. RADIATION DOSE REDUCTION: This exam was performed according to the departmental dose-optimization program which includes automated exposure control, adjustment of the mA and/or kV according to patient size and/or use of iterative reconstruction technique. CONTRAST:  75mL OMNIPAQUE IOHEXOL 350 MG/ML SOLN COMPARISON:  Radiograph 08/06/2023 FINDINGS: Cardiovascular: No significant vascular findings. Normal heart size. No pericardial effusion. Mediastinum/Nodes: No enlarged mediastinal or supraclavicular nodes. Homogeneous low-density lesion in the LEFT lobe of the thyroid gland measures 1.8 cm. No axillary adenopathy. Mildly enlarged LEFT hilar node measures 12 mm image 61. Lungs/Pleura: Large round mass in the LEFT lower lobe surrounds and obstructs the LEFT lower lobe bronchus. Mass measures 6.8 by 5.9 cm (image 61/3) and is solid. Centrilobular emphysema the upper lobes. Upper Abdomen: Adrenal glands are normal. No hepatic metastasis identified. There is mild extrahepatic biliary duct dilatation. Patient status post cholecystectomy. Musculoskeletal: No aggressive osseous lesion. IMPRESSION: 1. 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 Mass surrounds and obstructs the LEFT lower lobe bronchus. 2. Mildly enlarged LEFT hilar node. 3. No evidence of metastatic adenopathy in the mediastinum. 4. No  evidence of metastatic disease in the upper abdomen. 5. Benign appearing low-density lesion in the LEFT lobe of the thyroid gland. No specific follow-up recommended Electronically Signed   By: Genevive Bi M.D.   On: 08/08/2023 14:09     Past medical hx Past Medical History:  Diagnosis Date   Anemia    Anxiety    CHF (congestive heart failure) (HCC)    Chronic kidney disease    COPD (chronic obstructive pulmonary disease) (HCC)    Coronary artery disease    Diabetes mellitus without complication (HCC) 10-29-12   Headache    Hypertension    PONV (postoperative nausea and vomiting)    Shortness of breath dyspnea      Social History   Tobacco Use   Smoking status: Former    Current packs/day: 0.00    Types: Cigarettes    Quit date:  10/29/1992    Years since quitting: 30.8   Smokeless tobacco: Never  Substance Use Topics   Alcohol use: No    Alcohol/week: 0.0 standard drinks of alcohol   Drug use: No    Ms.Lave reports that she quit smoking about 30 years ago. Her smoking use included cigarettes. She has never used smokeless tobacco. She reports that she does not drink alcohol and does not use drugs.  Tobacco Cessation: Counseling given: Not Answered Former smoker, quit 1994  Past surgical hx, Family hx, Social hx all reviewed.  Current Outpatient Medications on File Prior to Visit  Medication Sig   Accu-Chek Softclix Lancets lancets DX: E11.21 as directed   acetaminophen (TYLENOL) 650 MG CR tablet Take by mouth.   albuterol (VENTOLIN HFA) 108 (90 Base) MCG/ACT inhaler 1 puff as needed.   amLODipine (NORVASC) 10 MG tablet Take 10 mg by mouth daily.   aspirin EC 81 MG tablet Take 81 mg by mouth daily.   atorvastatin (LIPITOR) 10 MG tablet Take 1 tablet (10 mg total) by mouth daily.   Blood Glucose Calibration (ACCU-CHEK AVIVA) SOLN DX: E11.21 as directed   carvedilol (COREG) 12.5 MG tablet Take 1 tablet (12.5 mg total) by mouth 2 (two) times daily.   glimepiride  (AMARYL) 2 MG tablet Take 2 mg by mouth daily.   glucose blood (ACCU-CHEK AVIVA PLUS) test strip use as directed to check  blood sugar 4 times daily   influenza vaccine adjuvanted (FLUAD) 0.5 ML injection Inject into the muscle.   insulin glargine (LANTUS) 100 UNIT/ML injection Inject 15 Units into the skin 2 (two) times daily.   insulin lispro (HUMALOG) 100 UNIT/ML KiwkPen Inject into the skin. Take 4 units in the skin in the a.m Take 6 units in the skin at lunch Take 8 units in the skin at supper   latanoprost (XALATAN) 0.005 % ophthalmic solution Place 1 drop into both eyes at bedtime.   mycophenolate (CELLCEPT) 250 MG capsule Take 750 mg by mouth.    nitroGLYCERIN (NITROSTAT) 0.4 MG SL tablet Place 0.4 mg under the tongue every 5 (five) minutes as needed for chest pain.   predniSONE (DELTASONE) 5 MG tablet Take 5 mg by mouth daily with breakfast.    Tacrolimus 1 MG TB24 Take 1 mg by mouth daily.   tacrolimus ER (ENVARSUS XR) 1 MG TB24 Take 1 mg by mouth daily.   umeclidinium-vilanterol (ANORO ELLIPTA) 62.5-25 MCG/INH AEPB Inhale 1 puff into the lungs daily.    COVID-19 mRNA bivalent vaccine, Pfizer, (PFIZER COVID-19 VAC BIVALENT) injection Inject into the muscle.   COVID-19 mRNA Vac-TriS, Pfizer, (PFIZER-BIONT COVID-19 VAC-TRIS) SUSP injection Inject into the muscle.   No current facility-administered medications on file prior to visit.     No Known Allergies  Review Of Systems:  Constitutional:   No  weight loss, night sweats,  Fevers, chills, fatigue, or  lassitude.  HEENT:   No headaches,  Difficulty swallowing,  Tooth/dental problems, or  Sore throat,                No sneezing, itching, ear ache, nasal congestion, post nasal drip,   CV:  No chest pain,  Orthopnea, PND, swelling in lower extremities, anasarca, dizziness, palpitations, syncope.   GI  No heartburn, indigestion, abdominal pain, nausea, vomiting, diarrhea, change in bowel habits, loss of appetite, bloody stools.    Resp: + shortness of breath with exertion less at rest.  No excess mucus, no productive cough,  No non-productive  cough,  ++ coughing up of blood.  No change in color of mucus.  No wheezing.  No chest wall deformity  Skin: no rash or lesions.  GU: no dysuria, change in color of urine, no urgency or frequency.  No flank pain, no hematuria   MS:  No joint pain or swelling.  No decreased range of motion.  No back pain.  Psych:  No change in mood or affect. No depression or anxiety.  No memory loss.   Vital Signs BP (!) 143/78 (BP Location: Right Arm, Patient Position: Sitting, Cuff Size: Large)   Pulse 77   Ht 5\' 1"  (1.549 m)   Wt 168 lb 12.8 oz (76.6 kg)   SpO2 95%   BMI 31.89 kg/m    Physical Exam:  General- No distress,  A&Ox3, pleasant ENT: No sinus tenderness, TM clear, pale nasal mucosa, no oral exudate,no post nasal drip, no LAN Cardiac: S1, S2, regular rate and rhythm, no murmur Chest: No wheeze/ rales/ dullness; no accessory muscle use, no nasal flaring, no sternal retractions Abd.: Soft Non-tender, ND, BS +, Body mass index is 31.89 kg/m.  Ext: No clubbing cyanosis, edema, no obvious deformities Neuro:  normal strength, MAE x 4, A&O x 3, appropriate Skin: No rashes, warm and dry, No obvious lesions  Psych: normal mood and behavior   Assessment/Plan Left lower lobe mass Large solid mass obstructing left main bronchus causing collapse and impaired breathing. Differential includes malignancy, requiring biopsy. COPD may complicate symptoms. - Schedule bronchoscopy with biopsy for mass and hilar lymph node. - Discuss potential debulking during bronchoscopy. - Educate about procedure, anesthesia, and complications: bleeding, infection, pneumothorax. - Instruct to stop baby aspirin day before procedure. - Coordinate with transplant team regarding procedure and anesthesia. - Plan follow-up visit one week post-procedure for biopsy results.  Chronic Obstructive Pulmonary  Disease (COPD) Stable interval, not flaring  Plan  Continue  Anoro and albuterol inhalers.  Kidney transplant Transplant in 2018, on immunosuppressants. General anesthesia should not affect transplant. - Instructed to inform transplant coordinator about biopsy and anesthesia.  Diabetes Managed with daily medication, relevant to overall health management.   Follow-up Necessary for discussion of biopsy results, and post procedure management Management and diagnosis of lung mass. - Schedule follow-up appointment one week after bronchoscopy for biopsy results and further management.  I spent 40 minutes dedicated to the care of this patient on the date of this encounter to include pre-visit review of records, face-to-face time with the patient discussing conditions above, post visit ordering of testing, clinical documentation with the electronic health record, making appropriate referrals as documented, and communicating necessary information to the patient's healthcare team.     Bevelyn Ngo, NP 08/15/2023  10:42 AM

## 2023-08-15 NOTE — Progress Notes (Signed)
 Anesthesia Chart Review: SAME DAY WORK-UP  Case: [id number] Date/Time: 08/18/23 1015   Procedures:      BRONCHOSCOPY, WITH BIOPSY USING ELECTROMAGNETIC NAVIGATION (Left) - WITH FLOURO     ENDOBRONCHIAL ULTRASOUND (EBUS) (Left)   Anesthesia type: General   Diagnosis: Lung nodules [R91.8]   Pre-op diagnosis: LEFT LUNG MASS   [facility] / [facility]   Surgeons: Leslye Peer, MD       DISCUSSION: Patient is a 73 year old female scheduled for the above procedure.  History includes former smoker (quit 10/29/92), postoperative N/V, COPD, HTN, DM2, CAD (LAD stent ~ 2002), CHF, dyspnea, anxiety, CKD (renal transplant 12/15/17), anemia.  She had been followed by cardiologist Dr. Verdis Prime dating back to at least 2015. By notes, had LAD stent back around 2002. By 04/14/14 cardiac cath, she had a widely patent LAD stent with noncritical LCX and RCA disease. She did have severe stenosis in a D1 branch which was small and treated medically. LVEF was 35-40%. She had a non-ischemic stress test, EF 44% in 2017. Her echo following 2019 renal transplant showed recovery of her LVEF to 55-60%. Last visit was on 05/25/21. CAD felt stable at that time with one year follow-up planned. Dr. Katrinka Blazing has since retired.   Recently she had chest imaging due to hemoptysis. A chest CT revealed a large solid mass in the LLL obstructing the left main bronchus. PCP referred her to pulmonology and bronchoscopy with biopsies recommended.  She has suspected lung cancer but need tissue diagnosis. Anesthesia team to evaluate on the day of surgery. She is for labs and EKG on arrival--Creatinine was 0.80 as of 08/08/23.     VS:  Wt Readings from Last 3 Encounters:  08/15/23 76.6 kg  05/25/21 77.3 kg  06/08/20 76.3 kg   BP Readings from Last 3 Encounters:  08/15/23 (!) 143/78  05/25/21 138/68  06/08/20 (!) 160/90   Pulse Readings from Last 3 Encounters:  08/15/23 77  05/25/21 77  06/08/20 80      PROVIDERS: Georgann Housekeeper, MD is PCP    LABS: For labs on arrival as indicated.    IMAGES: CT Chest 08/08/23: IMPRESSION: 1. 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 Mass surrounds and obstructs the LEFT lower lobe bronchus. 2. Mildly enlarged LEFT hilar node. 3. No evidence of metastatic adenopathy in the mediastinum. 4. No evidence of metastatic disease in the upper abdomen. 5. Benign appearing low-density lesion in the LEFT lobe of the thyroid gland. No specific follow-up recommended     EKG: Last EKG noted is from 05/25/21: Normal sinus rhythm   CV: Echo 01/26/19: IMPRESSIONS   1. The left ventricle has normal systolic function, with an ejection  fraction of 55-60%. The cavity size was normal. Left ventricular diastolic  Doppler parameters are consistent with impaired relaxation. Elevated mean  left atrial pressure.   2. The right ventricle has normal systolic function. The cavity was  normal.   3. The mitral valve is grossly normal. There is mild mitral annular  calcification present.   4. The aortic valve is tricuspid. Mild thickening of the aortic valve. No  stenosis of the aortic valve.   5. The aorta is normal unless otherwise noted.   6. Normal LV systolic function; grade 1 diastolic dysfunction.  - Comparison (pre-renal transplant) LVEF 35-40%, diffuse LV hypokinesis, grade 1 DD, trivial MR 09/05/15   Nuclear stress test 01/30/16: Nuclear stress EF:  44%. There was no ST segment deviation noted during stress. No T wave inversion was noted during stress. This is an intermediate risk study due to reduced systolic function. The left ventricular ejection fraction is moderately decreased (30-44%). No ischemia   Cardiac cath 04/14/14: LEFT VENTRICULOGRAM:  Left ventricular angiogram was done in the 30 RAO projection and revealed global hypokinesis with an EF of approximately 35-40%. LVEDP was elevated.      IMPRESSIONS:  1. Severe disease in a branch of the first diagonal. The vessel is relatively small in caliber and is best treated with medical therapy. This is the likely source of the patient's angina. 2. Widely patent LAD stent. Noncritical circumflex and right coronary disease as noted above. 3. Global left ventricular systolic dysfunction with elevated end-diastolic pressure consistent with chronic systolic heart failure.     RECOMMENDATION:  Medical management of angina to include up titration of beta blocker and nitrate therapy.   Enhance heart failure therapy.   Consider repeat GI workup to identify the source of anemia (if indicated).   Past Medical History:  Diagnosis Date   Anemia    Anxiety    CHF (congestive heart failure) (HCC)    Chronic kidney disease    COPD (chronic obstructive pulmonary disease) (HCC)    Coronary artery disease    Diabetes mellitus without complication (HCC) 10-29-12   Headache    Hypertension    PONV (postoperative nausea and vomiting)    Shortness of breath dyspnea     Past Surgical History:  Procedure Laterality Date   ABDOMINAL HYSTERECTOMY     ANAL FISSURE REPAIR     APPENDECTOMY     '74- open with gallbladder   AV FISTULA PLACEMENT Left 01/31/2015   Procedure: ARTERIOVENOUS FISTULA CREATION-LEFT BRACHIO-CEPHALIC ;  Surgeon: Fransisco Hertz, MD;  Location: [facility] OR;  Service: Vascular;  Laterality: Left;   AV FISTULA PLACEMENT Left 04/04/2015   Procedure: INSERTION OF ARTERIOVENOUS (AV) GORE-TEX GRAFT ARM;  Surgeon: Sherren Kerns, MD;  Location: [facility];  Service: Vascular;  Laterality: Left;   BREAST EXCISIONAL BIOPSY Left    BREAST SURGERY     cyst removed   CARDIAC CATHETERIZATION     1 coronary stent placed   CHOLECYSTECTOMY     '74-open   COLONOSCOPY WITH PROPOFOL N/A 11/17/2012   Procedure: COLONOSCOPY WITH PROPOFOL;  Surgeon: Charolett Bumpers, MD;  Location: [facility] ENDOSCOPY;  Service: Endoscopy;  Laterality: N/A;   CORONARY STENT  PLACEMENT     ELBOW SURGERY Right    tendon surgery   ESOPHAGOGASTRODUODENOSCOPY (EGD) WITH PROPOFOL N/A 11/17/2012   Procedure: ESOPHAGOGASTRODUODENOSCOPY (EGD) WITH PROPOFOL;  Surgeon: Charolett Bumpers, MD;  Location: [facility] ENDOSCOPY;  Service: Endoscopy;  Laterality: N/A;   GANGLION CYST EXCISION Bilateral 10-29-12   wrist   LEFT HEART CATHETERIZATION WITH CORONARY ANGIOGRAM N/A 04/14/2014   Procedure: LEFT HEART CATHETERIZATION WITH CORONARY ANGIOGRAM;  Surgeon: Lesleigh Noe, MD;  Location[facility] C[facility] LAB;  Service: Cardiovascular;  Laterality: N/A;   PERIPHERAL VASCULAR CATHETERIZATION N/A 03/09/2015   Procedure: Fistulagram;  Surgeon: Fransisco Hertz, MD;  Location: [facility] INVASIVE CV LAB;  Service: Cardiovascular;  Laterality: N/A;   TEE WITHOUT CARDIOVERSION N/A 01/20/2015   Procedure: TRANSESOPHAGEAL ECHOCARDIOGRAM (TEE);  Surgeon: Pricilla Riffle, MD;  Location: [facility] ENDOSCOPY;  Service: Cardiovascular;  Laterality: N/A;   TUBAL LIGATION      MEDICATIONS: No current facility-administered medications for this encounter.    Accu-Chek Softclix Lancets lancets  acetaminophen (TYLENOL) 650 MG CR tablet   albuterol (VENTOLIN HFA) 108 (90 Base) MCG/ACT inhaler   amLODipine (NORVASC) 10 MG tablet   aspirin EC 81 MG tablet   atorvastatin (LIPITOR) 10 MG tablet   Blood Glucose Calibration (ACCU-CHEK AVIVA) SOLN   carvedilol (COREG) 12.5 MG tablet   COVID-19 mRNA bivalent vaccine, Pfizer, (PFIZER COVID-19 VAC BIVALENT) injection   COVID-19 mRNA Vac-TriS, Pfizer, (PFIZER-BIONT COVID-19 VAC-TRIS) SUSP injection   glimepiride (AMARYL) 2 MG tablet   glucose blood (ACCU-CHEK AVIVA PLUS) test strip   influenza vaccine adjuvanted (FLUAD) 0.5 ML injection   insulin glargine (LANTUS) 100 UNIT/ML injection   insulin lispro (HUMALOG) 100 UNIT/ML KiwkPen   latanoprost (XALATAN) 0.005 % ophthalmic solution   mycophenolate (CELLCEPT) 250 MG capsule   nitroGLYCERIN (NITROSTAT) 0.4 MG SL tablet   predniSONE  (DELTASONE) 5 MG tablet   Tacrolimus 1 MG TB24   tacrolimus ER (ENVARSUS XR) 1 MG TB24   umeclidinium-vilanterol (ANORO ELLIPTA) 62.5-25 MCG/INH AEPB    Shonna Chock, PA-C Surgical Short Stay/Anesthesiology [facility] Phone [phone] [facility] Phone [phone] 08/15/2023 1:14 PM

## 2023-08-15 NOTE — Patient Instructions (Signed)
 It id good to meet you today. The CT chest Dr. Donette Larry did shows a large mass in your left lower lobe. This is causing obstruction of some airways, which is why you are having difficulty breathing. This is concerning for a lung cancer, but we need to get a biopsy to confirm this.  I have placed an order for a bronchoscopy with biopsies.  We have discussed the procedure in detail.  We have reviewed the risks and benefits of the procedure. These include bleeding, infection, puncture of the lung, and adverse reaction to anesthesia. You have agreed to proceed with biopsy to evaluate the left upper lobe nodule. Your procedure will be done by Dr. Levy Pupa. You will receive a letter today with date time and information pertaining to the procedure. You will need someone to drive you to the procedure, stay with you during the procedure, and stay with you after the procedure. You will also need someone to stay with you for 24 hours after anesthesia to ensure you have cleared and are doing well. You will follow-up with me 1 week after the procedure to review the results and to ensure you are doing well. Call if you need [place] prior to the procedure or if you have any questions at all. Call, or seek emergency care  if the blood in your sputum increases in volume or get hard to manage. Call us if you need [place].  Please contact office for sooner follow up if symptoms do not improve or worsen or seek emergency care

## 2023-08-15 NOTE — Anesthesia Preprocedure Evaluation (Addendum)
 Anesthesia Evaluation  Patient identified by MRN, date of birth, ID band Patient awake    Reviewed: Allergy & Precautions, H&P , NPO status , Patient's Chart, lab work & pertinent test results  History of Anesthesia Complications (+) PONV and history of anesthetic complications  Airway Mallampati: II   Neck ROM: full    Dental   Pulmonary shortness of breath, COPD, former smoker   breath sounds clear to auscultation       Cardiovascular hypertension, +CHF   Rhythm:regular Rate:Normal     Neuro/Psych  Headaches  Anxiety        GI/Hepatic   Endo/Other  diabetes, Type 2    Renal/GU Renal diseaseKidney transplant     Musculoskeletal   Abdominal   Peds  Hematology   Anesthesia Other Findings   Reproductive/Obstetrics                             Anesthesia Physical Anesthesia Plan  ASA: 3  Anesthesia Plan: General   Post-op Pain Management:    Induction: Intravenous  PONV Risk Score and Plan: 4 or greater and Ondansetron, Dexamethasone and Treatment may vary due to age or medical condition  Airway Management Planned: Oral ETT  Additional Equipment:   Intra-op Plan:   Post-operative Plan: Extubation in OR  Informed Consent: I have reviewed the patients History and Physical, chart, labs and discussed the procedure including the risks, benefits and alternatives for the proposed anesthesia with the patient or authorized representative who has indicated his/her understanding and acceptance.     Dental advisory given  Plan Discussed with: CRNA, Anesthesiologist and Surgeon  Anesthesia Plan Comments: (PAT note written 08/15/2023 by Shonna Chock, PA-C.  )       Anesthesia Quick Evaluation

## 2023-08-15 NOTE — Progress Notes (Signed)
 History of Present Illness Pamela [name] is a 73 y.o. female former smoker with COPD and a kidney transplant referred , 08/2023 for finding of a lung mass on CXR with hemoptysis. She will be followed by Dr. Delton Coombes.    08/15/2023 Pt. Presents for lung mass consult. She has agreed to the use of the [employer] software during this visit.   Pamela [name] is a 73 year old female with COPD and a history of kidney transplant who presents with hemoptysis and shortness of breath. She was referred by Dr. Daiva Huge for evaluation of a lung mass.  She has been experiencing increased shortness of breath and hemoptysis for a little over two weeks. The hemoptysis is accompanied by a rattling sensation in her chest, prompting her to cough. There is no recent weight loss, and her appetite remains good.  A chest CT scan revealed a large solid mass in the left lower lobe of her lung, obstructing the left main bronchus. This obstruction is likely contributing to her respiratory symptoms. She has not yet seen the scan results herself.  Her past medical history includes COPD, diagnosed in 2004, managed with Anoro and albuterol inhalers. She also has hypertension, diabetes, and a history of kidney disease, having received a kidney transplant in 2018. She is on immunosuppressant medication post-transplant.  She quit smoking in 1994 after a 20-year history of smoking two packs per day. She denies any recent travel and lives with her husband. She is retired and has adult children.  During the review of symptoms, she denies sleep apnea and latex allergy. She confirms daily medication use for diabetes and takes a baby aspirin.  We have reviewed her CT images so she has a good idea of what the lung mass looks like, and have discussed that the next best option in her care is for a bronchoscopy with biopsies. We have discussed the risks and benefits of the procedure. She has given informed consent to move forward with  bronchoscopy with biopsy.Her husband who is here with her is in  agreement with this plan.   Test Results: 08/08/2023 CT Chest   Mediastinum/Nodes: No enlarged mediastinal or supraclavicular nodes. Homogeneous low-density lesion in the LEFT lobe of the thyroid gland measures 1.8 cm. No axillary adenopathy.   Mildly enlarged LEFT hilar node measures 12 mm image 61.   Lungs/Pleura: Large round mass in the LEFT lower lobe surrounds and obstructs the LEFT lower lobe bronchus. Mass measures 6.8 by 5.9 cm (image 61/3) and is solid. Centrilobular emphysema the upper lobes. 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 Mass surrounds and obstructs the LEFT lower lobe bronchus. 2. Mildly enlarged LEFT hilar node. 3. No evidence of metastatic adenopathy in the mediastinum. 4. No evidence of metastatic disease in the upper abdomen. 5. Benign appearing low-density lesion in the LEFT lobe of the thyroid gland. No specific follow-up recommended  PMH - History of hypertension - History of kidney disease - History of COPD since 2004 - History of shortness of breath - History of diabetes     Latest Ref Rng & Units 11/24/2017    8:10 AM 11/24/2017    6:05 AM 11/23/2017   10:14 AM  CBC  WBC 4.0 - 10.5 K/uL 14.5  15.3  11.4   Hemoglobin 12.0 - 15.0 g/dL 40.9  81.1  7.4   Hematocrit 36.0 - 46.0 % 34.2  35.1  23.6   Platelets 150 - 400 K/uL  249  208  231        Latest Ref Rng & Units 08/08/2023   11:27 AM 11/24/2017    8:10 AM 11/24/2017    6:05 AM  BMP  Glucose 70 - 99 mg/dL  956  213   BUN 8 - 23 mg/dL  73  71   Creatinine 0.86 - 1.00 mg/dL 5.78  4.69  6.29   Sodium 135 - 145 mmol/L  136  136   Potassium 3.5 - 5.1 mmol/L  4.0  4.2   Chloride 98 - 111 mmol/L  93  94   CO2 22 - 32 mmol/L  29  26   Calcium 8.9 - 10.3 mg/dL  9.6  9.6     BNP    Component Value Date/Time   BNP 2,160.6 (H) 01/14/2015 1440    ProBNP    Component Value  Date/Time   PROBNP 284.0 (H) 03/25/2014 0918    PFT    Component Value Date/Time   FEV1PRE 0.75 12/24/2016 1038   FEV1POST 0.79 12/24/2016 1038   FVCPRE 1.58 12/24/2016 1038   FVCPOST 1.66 12/24/2016 1038   TLC 5.09 12/24/2016 1038   DLCOUNC 11.69 12/24/2016 1038   PREFEV1FVCRT 48 12/24/2016 1038   PSTFEV1FVCRT 47 12/24/2016 1038    CT CHEST W CONTRAST Result Date: 08/08/2023 CLINICAL DATA:  LEFT lung mass on chest radiograph EXAM: CT CHEST WITH CONTRAST TECHNIQUE: Multidetector CT imaging of the chest was performed during intravenous contrast administration. RADIATION DOSE REDUCTION: This exam was performed according to the departmental dose-optimization program which includes automated exposure control, adjustment of the mA and/or kV according to patient size and/or use of iterative reconstruction technique. CONTRAST:  75mL OMNIPAQUE IOHEXOL 350 MG/ML SOLN COMPARISON:  Radiograph 08/06/2023 FINDINGS: Cardiovascular: No significant vascular findings. Normal heart size. No pericardial effusion. Mediastinum/Nodes: No enlarged mediastinal or supraclavicular nodes. Homogeneous low-density lesion in the LEFT lobe of the thyroid gland measures 1.8 cm. No axillary adenopathy. Mildly enlarged LEFT hilar node measures 12 mm image 61. Lungs/Pleura: Large round mass in the LEFT lower lobe surrounds and obstructs the LEFT lower lobe bronchus. Mass measures 6.8 by 5.9 cm (image 61/3) and is solid. Centrilobular emphysema the upper lobes. Upper Abdomen: Adrenal glands are normal. No hepatic metastasis identified. There is mild extrahepatic biliary duct dilatation. Patient status post cholecystectomy. Musculoskeletal: No aggressive osseous lesion. IMPRESSION: 1. 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 Mass surrounds and obstructs the LEFT lower lobe bronchus. 2. Mildly enlarged LEFT hilar node. 3. No evidence of metastatic adenopathy in the mediastinum. 4. No  evidence of metastatic disease in the upper abdomen. 5. Benign appearing low-density lesion in the LEFT lobe of the thyroid gland. No specific follow-up recommended Electronically Signed   By: Genevive Bi M.D.   On: 08/08/2023 14:09     Past medical hx Past Medical History:  Diagnosis Date   Anemia    Anxiety    CHF (congestive heart failure) (HCC)    Chronic kidney disease    COPD (chronic obstructive pulmonary disease) (HCC)    Coronary artery disease    Diabetes mellitus without complication (HCC) 10-29-12   Headache    Hypertension    PONV (postoperative nausea and vomiting)    Shortness of breath dyspnea      Social History   Tobacco Use   Smoking status: Former    Current packs/day: 0.00    Types: Cigarettes    Quit date:  10/29/1992    Years since quitting: 30.8   Smokeless tobacco: Never  Substance Use Topics   Alcohol use: No    Alcohol/week: 0.0 standard drinks of alcohol   Drug use: No    Ms.Lave reports that she quit smoking about 30 years ago. Her smoking use included cigarettes. She has never used smokeless tobacco. She reports that she does not drink alcohol and does not use drugs.  Tobacco Cessation: Counseling given: Not Answered Former smoker, quit 1994  Past surgical hx, Family hx, Social hx all reviewed.  Current Outpatient Medications on File Prior to Visit  Medication Sig   Accu-Chek Softclix Lancets lancets DX: E11.21 as directed   acetaminophen (TYLENOL) 650 MG CR tablet Take by mouth.   albuterol (VENTOLIN HFA) 108 (90 Base) MCG/ACT inhaler 1 puff as needed.   amLODipine (NORVASC) 10 MG tablet Take 10 mg by mouth daily.   aspirin EC 81 MG tablet Take 81 mg by mouth daily.   atorvastatin (LIPITOR) 10 MG tablet Take 1 tablet (10 mg total) by mouth daily.   Blood Glucose Calibration (ACCU-CHEK AVIVA) SOLN DX: E11.21 as directed   carvedilol (COREG) 12.5 MG tablet Take 1 tablet (12.5 mg total) by mouth 2 (two) times daily.   glimepiride  (AMARYL) 2 MG tablet Take 2 mg by mouth daily.   glucose blood (ACCU-CHEK AVIVA PLUS) test strip use as directed to check  blood sugar 4 times daily   influenza vaccine adjuvanted (FLUAD) 0.5 ML injection Inject into the muscle.   insulin glargine (LANTUS) 100 UNIT/ML injection Inject 15 Units into the skin 2 (two) times daily.   insulin lispro (HUMALOG) 100 UNIT/ML KiwkPen Inject into the skin. Take 4 units in the skin in the a.m Take 6 units in the skin at lunch Take 8 units in the skin at supper   latanoprost (XALATAN) 0.005 % ophthalmic solution Place 1 drop into both eyes at bedtime.   mycophenolate (CELLCEPT) 250 MG capsule Take 750 mg by mouth.    nitroGLYCERIN (NITROSTAT) 0.4 MG SL tablet Place 0.4 mg under the tongue every 5 (five) minutes as needed for chest pain.   predniSONE (DELTASONE) 5 MG tablet Take 5 mg by mouth daily with breakfast.    Tacrolimus 1 MG TB24 Take 1 mg by mouth daily.   tacrolimus ER (ENVARSUS XR) 1 MG TB24 Take 1 mg by mouth daily.   umeclidinium-vilanterol (ANORO ELLIPTA) 62.5-25 MCG/INH AEPB Inhale 1 puff into the lungs daily.    COVID-19 mRNA bivalent vaccine, Pfizer, (PFIZER COVID-19 VAC BIVALENT) injection Inject into the muscle.   COVID-19 mRNA Vac-TriS, Pfizer, (PFIZER-BIONT COVID-19 VAC-TRIS) SUSP injection Inject into the muscle.   No current facility-administered medications on file prior to visit.     No Known Allergies  Review Of Systems:  Constitutional:   No  weight loss, night sweats,  Fevers, chills, fatigue, or  lassitude.  HEENT:   No headaches,  Difficulty swallowing,  Tooth/dental problems, or  Sore throat,                No sneezing, itching, ear ache, nasal congestion, post nasal drip,   CV:  No chest pain,  Orthopnea, PND, swelling in lower extremities, anasarca, dizziness, palpitations, syncope.   GI  No heartburn, indigestion, abdominal pain, nausea, vomiting, diarrhea, change in bowel habits, loss of appetite, bloody stools.    Resp: + shortness of breath with exertion less at rest.  No excess mucus, no productive cough,  No non-productive  cough,  ++ coughing up of blood.  No change in color of mucus.  No wheezing.  No chest wall deformity  Skin: no rash or lesions.  GU: no dysuria, change in color of urine, no urgency or frequency.  No flank pain, no hematuria   MS:  No joint pain or swelling.  No decreased range of motion.  No back pain.  Psych:  No change in mood or affect. No depression or anxiety.  No memory loss.   Vital Signs BP (!) 143/78 (BP Location: Right Arm, Patient Position: Sitting, Cuff Size: Large)   Pulse 77   Ht 5\' 1"  (1.549 m)   Wt 168 lb 12.8 oz (76.6 kg)   SpO2 95%   BMI 31.89 kg/m    Physical Exam:  General- No distress,  A&Ox3, pleasant ENT: No sinus tenderness, TM clear, pale nasal mucosa, no oral exudate,no post nasal drip, no LAN Cardiac: S1, S2, regular rate and rhythm, no murmur Chest: No wheeze/ rales/ dullness; no accessory muscle use, no nasal flaring, no sternal retractions Abd.: Soft Non-tender, ND, BS +, Body mass index is 31.89 kg/m.  Ext: No clubbing cyanosis, edema, no obvious deformities Neuro:  normal strength, MAE x 4, A&O x 3, appropriate Skin: No rashes, warm and dry, No obvious lesions  Psych: normal mood and behavior   Assessment/Plan Left lower lobe mass Large solid mass obstructing left main bronchus causing collapse and impaired breathing. Differential includes malignancy, requiring biopsy. COPD may complicate symptoms. - Schedule bronchoscopy with biopsy for mass and hilar lymph node. - Discuss potential debulking during bronchoscopy. - Educate about procedure, anesthesia, and complications: bleeding, infection, pneumothorax. - Instruct to stop baby aspirin day before procedure. - Coordinate with transplant team regarding procedure and anesthesia. - Plan follow-up visit one week post-procedure for biopsy results.  Chronic Obstructive Pulmonary  Disease (COPD) Stable interval, not flaring  Plan  Continue  Anoro and albuterol inhalers.  Kidney transplant Transplant in 2018, on immunosuppressants. General anesthesia should not affect transplant. - Instructed to inform transplant coordinator about biopsy and anesthesia.  Diabetes Managed with daily medication, relevant to overall health management.   Follow-up Necessary for discussion of biopsy results, and post procedure management Management and diagnosis of lung mass. - Schedule follow-up appointment one week after bronchoscopy for biopsy results and further management.  I spent 40 minutes dedicated to the care of this patient on the date of this encounter to include pre-visit review of records, face-to-face time with the patient discussing conditions above, post visit ordering of testing, clinical documentation with the electronic health record, making appropriate referrals as documented, and communicating necessary information to the patient's healthcare team.     Bevelyn Ngo, NP 08/15/2023  10:42 AM

## 2023-08-15 NOTE — Progress Notes (Addendum)
 SDW CALL  Patient was given pre-op instructions over the phone. The opportunity was given for the patient to ask questions. No further questions asked. Patient verbalized understanding of instructions given.   PCP -  Adah Perl  Cardiologist - pt states she has not seen cardiologist seince Dr. Katrinka Blazing retired. She said she is supposed to make an appointment with Donato Schultz.   PPM/ICD - denies Device Orders -  Rep Notified -   Chest x-ray - CT-08/08/23 EKG - DOS- last one -05/25/21 Stress Test - 2017 ECHO - 01/20/15 Cardiac Cath - 04/14/14  Sleep Study - denies CPAP -   Fasting Blood Sugar - 80 Checks Blood Sugar 4 times a day  Blood Thinner Instructions:na Aspirin Instructions:per Kandice Robinsons NP in Dr. Kavin Leech office 08/15/23-stop aspirin day before procedure.   ERAS Protcol -no PRE-SURGERY Ensure or G2-   COVID TEST- na   Anesthesia review: yes- CHF,CAD,HTN,DM  Patient denies shortness of breath, fever, cough and chest pain over the phone call    Surgical Instructions    Your procedure is scheduled on April 7  Report to [facility] Main Entrance "A" at 0745 A.M., then check in with the Admitting office.  Call this number if you have problems the morning of surgery:  [phone]    Remember:  Do not eat or drink anything  after midnight the night before your surgery   Take these medicines the morning of surgery with A SIP OF WATER: Amlodipine,,Coreg,Anoro Ellipta,Cellcept,Prednisone,,Tacrolimus ER. PRN- Nitroglycerin- call [phone] if you have take this medicine before coming to the hospital. PRN Tylenol,Albuterol inhaler- bring inhaler with you.   Per instruction from Kandice Robinsons NP in Dr Kavin Leech office, stop baby aspirin day before your procedure.   As of today, STOP taking any Aleve, Naproxen, Ibuprofen, Motrin, Advil, Goody's, BC's, all herbal medications, fish oil, and all vitamins.   WHAT DO I DO ABOUT MY DIABETES MEDICATION? Do not take oral  diabetes medicines (pills) the morning of surgery.DO NOT TAKE GLIMEPIRIDE(AMARYL) THE MORNING OF SURGERY.  THE NIGHT BEFORE SURGERY, take 5 units of Lantus insulin.     THE MORNING OF SURGERY, take 15 units of Lantus insulin.  If your CBG is greater than 220 mg/dL,the morning of surgery you may take 2units ( of your sliding scale (correction) dose)of lispro (Humalog)insulin.    Check your blood sugar the morning of your surgery when you wake up and every 2 hours until you get to the Short Stay unit.  If your blood sugar is less than 70 mg/dL, you will need to treat for low blood sugar: Do not take insulin. Treat a low blood sugar (less than 70 mg/dL) with  cup of clear juice (cranberry or apple), 4 glucose tablets, OR glucose gel. Recheck blood sugar in 15 minutes after treatment (to make sure it is greater than 70 mg/dL). If your blood sugar is not greater than 70 mg/dL on recheck, call [phone] for further instructions. Report your blood sugar to the short stay nurse when you get to Short Stay.  [facility] is not responsible for any belongings or valuables. .   Do NOT Smoke (Tobacco/Vaping)  24 hours prior to your procedure  If you use a CPAP at night, you may bring your mask for your overnight stay.   Contacts, glasses, hearing aids, dentures or partials may not be worn into surgery, please bring cases for these belongings   Patients discharged the day of surgery will not be allowed to drive  home, and someone needs to stay with them for 24 hours.     Special instructions:    Oral Hygiene is also important to reduce your risk of infection.  Remember - BRUSH YOUR TEETH THE MORNING OF SURGERY WITH YOUR REGULAR TOOTHPASTE   Day of Surgery:  Take a shower the day of or night before with antibacterial soap. Wear Clean/Comfortable clothing the morning of surgery Do not apply any deodorants/lotions.   Do not wear jewelry or makeup Do not wear lotions, powders,  perfumes/colognes, or deodorant. Do not shave 48 hours prior to surgery.  Men may shave face and neck. Do not bring valuables to the hospital. Do not wear nail polish, gel polish, artificial nails, or any other type of covering on natural nails (fingers and toes) If you have artificial nails or gel coating that need to be removed by a nail salon, please have this removed prior to surgery. Artificial nails or gel coating may interfere with anesthesia's ability to adequately monitor your vital signs. Remember to brush your teeth WITH YOUR REGULAR TOOTHPASTE.

## 2023-08-18 ENCOUNTER — Ambulatory Visit (HOSPITAL_COMMUNITY)
Admission: RE | Admit: 2023-08-18 | Discharge: 2023-08-18 | Disposition: A | Discharge status: Home / Self Care | Source: Ambulatory Visit | Attending: Emergency Medicine | Admitting: Emergency Medicine

## 2023-08-18 ENCOUNTER — Encounter (HOSPITAL_COMMUNITY): Payer: Self-pay | Admitting: Emergency Medicine

## 2023-08-18 ENCOUNTER — Ambulatory Visit (HOSPITAL_COMMUNITY)

## 2023-08-18 ENCOUNTER — Ambulatory Visit (HOSPITAL_BASED_OUTPATIENT_CLINIC_OR_DEPARTMENT_OTHER): Payer: Self-pay | Admitting: Vascular Surgery

## 2023-08-18 ENCOUNTER — Other Ambulatory Visit: Payer: Self-pay

## 2023-08-18 ENCOUNTER — Ambulatory Visit (HOSPITAL_COMMUNITY): Payer: Self-pay | Admitting: Vascular Surgery

## 2023-08-18 ENCOUNTER — Encounter (HOSPITAL_COMMUNITY)
Admission: RE | Disposition: A | Discharge status: Home / Self Care | Payer: Self-pay | Source: Ambulatory Visit | Attending: Emergency Medicine

## 2023-08-18 DIAGNOSIS — I2510 Atherosclerotic heart disease of native coronary artery without angina pectoris: Secondary | ICD-10-CM | POA: Diagnosis not present

## 2023-08-18 DIAGNOSIS — J850 Gangrene and necrosis of lung: Secondary | ICD-10-CM | POA: Diagnosis not present

## 2023-08-18 DIAGNOSIS — C3432 Malignant neoplasm of lower lobe, left bronchus or lung: Secondary | ICD-10-CM

## 2023-08-18 DIAGNOSIS — J432 Centrilobular emphysema: Secondary | ICD-10-CM | POA: Diagnosis not present

## 2023-08-18 DIAGNOSIS — Z794 Long term (current) use of insulin: Secondary | ICD-10-CM | POA: Insufficient documentation

## 2023-08-18 DIAGNOSIS — I700 Atherosclerosis of aorta: Secondary | ICD-10-CM | POA: Diagnosis not present

## 2023-08-18 DIAGNOSIS — I25119 Atherosclerotic heart disease of native coronary artery with unspecified angina pectoris: Secondary | ICD-10-CM | POA: Insufficient documentation

## 2023-08-18 DIAGNOSIS — Z940 Kidney transplant status: Secondary | ICD-10-CM | POA: Insufficient documentation

## 2023-08-18 DIAGNOSIS — I130 Hypertensive heart and chronic kidney disease with heart failure and stage 1 through stage 4 chronic kidney disease, or unspecified chronic kidney disease: Secondary | ICD-10-CM | POA: Insufficient documentation

## 2023-08-18 DIAGNOSIS — R918 Other nonspecific abnormal finding of lung field: Secondary | ICD-10-CM | POA: Diagnosis not present

## 2023-08-18 DIAGNOSIS — N189 Chronic kidney disease, unspecified: Secondary | ICD-10-CM | POA: Insufficient documentation

## 2023-08-18 DIAGNOSIS — Z87891 Personal history of nicotine dependence: Secondary | ICD-10-CM | POA: Diagnosis not present

## 2023-08-18 DIAGNOSIS — I5022 Chronic systolic (congestive) heart failure: Secondary | ICD-10-CM | POA: Diagnosis not present

## 2023-08-18 DIAGNOSIS — F419 Anxiety disorder, unspecified: Secondary | ICD-10-CM | POA: Insufficient documentation

## 2023-08-18 DIAGNOSIS — Z7984 Long term (current) use of oral hypoglycemic drugs: Secondary | ICD-10-CM | POA: Insufficient documentation

## 2023-08-18 DIAGNOSIS — N186 End stage renal disease: Secondary | ICD-10-CM | POA: Diagnosis not present

## 2023-08-18 DIAGNOSIS — D649 Anemia, unspecified: Secondary | ICD-10-CM | POA: Diagnosis not present

## 2023-08-18 DIAGNOSIS — I509 Heart failure, unspecified: Secondary | ICD-10-CM | POA: Diagnosis not present

## 2023-08-18 DIAGNOSIS — Z48813 Encounter for surgical aftercare following surgery on the respiratory system: Secondary | ICD-10-CM | POA: Diagnosis not present

## 2023-08-18 DIAGNOSIS — E1122 Type 2 diabetes mellitus with diabetic chronic kidney disease: Secondary | ICD-10-CM | POA: Diagnosis not present

## 2023-08-18 HISTORY — PX: BRONCHIAL BRUSHINGS: SHX5108

## 2023-08-18 HISTORY — PX: BRONCHIAL BIOPSY: SHX5109

## 2023-08-18 HISTORY — PX: VIDEO BRONCHOSCOPY: SHX5072

## 2023-08-18 LAB — CBC
HCT: 45.1 % (ref 36.0–46.0)
Hemoglobin: 14.2 g/dL (ref 12.0–15.0)
MCH: 28.6 pg (ref 26.0–34.0)
MCHC: 31.5 g/dL (ref 30.0–36.0)
MCV: 90.9 fL (ref 80.0–100.0)
Platelets: 242 10*3/uL (ref 150–400)
RBC: 4.96 MIL/uL (ref 3.87–5.11)
RDW: 14.1 % (ref 11.5–15.5)
WBC: 10.1 10*3/uL (ref 4.0–10.5)
nRBC: 0 % (ref 0.0–0.2)

## 2023-08-18 LAB — BASIC METABOLIC PANEL WITH GFR
Anion gap: 12 (ref 5–15)
BUN: 10 mg/dL (ref 8–23)
CO2: 22 mmol/L (ref 22–32)
Calcium: 9.2 mg/dL (ref 8.9–10.3)
Chloride: 103 mmol/L (ref 98–111)
Creatinine, Ser: 0.86 mg/dL (ref 0.44–1.00)
GFR, Estimated: >60 mL/min (ref >60)
Glucose, Bld: 253 mg/dL — ABNORMAL HIGH (ref 70–99)
Potassium: 4.2 mmol/L (ref 3.5–5.1)
Sodium: 137 mmol/L (ref 135–145)

## 2023-08-18 LAB — GLUCOSE, CAPILLARY
Glucose-Capillary: 255 mg/dL — ABNORMAL HIGH (ref 70–99)
Glucose-Capillary: 211 mg/dL — ABNORMAL HIGH (ref 70–99)
Glucose-Capillary: 174 mg/dL — ABNORMAL HIGH (ref 70–99)

## 2023-08-18 SURGERY — BRONCHOSCOPY, WITH FLUOROSCOPY
Anesthesia: General

## 2023-08-18 MED ORDER — FENTANYL CITRATE (PF) 250 MCG/5ML IJ SOLN
INTRAMUSCULAR | Status: DC | PRN
  Administered 2023-08-18 (×2): 25 ug via INTRAVENOUS

## 2023-08-18 MED ORDER — FENTANYL CITRATE (PF) 100 MCG/2ML IJ SOLN
INTRAMUSCULAR | Status: AC
  Filled 2023-08-18: qty 2

## 2023-08-18 MED ORDER — ONDANSETRON HCL 4 MG/2ML IJ SOLN: 4.0000 mg | Freq: Four times a day (QID) | INTRAMUSCULAR | Status: DC | PRN

## 2023-08-18 MED ORDER — ROCURONIUM BROMIDE 10 MG/ML (PF) SYRINGE
PREFILLED_SYRINGE | INTRAVENOUS | Status: DC | PRN
  Administered 2023-08-18: 50 mg via INTRAVENOUS

## 2023-08-18 MED ORDER — PHENYLEPHRINE 80 MCG/ML (10ML) SYRINGE FOR IV PUSH (FOR BLOOD PRESSURE SUPPORT)
PREFILLED_SYRINGE | INTRAVENOUS | Status: DC | PRN
  Administered 2023-08-18: 80 ug via INTRAVENOUS

## 2023-08-18 MED ORDER — CHLORHEXIDINE GLUCONATE 0.12 % MT SOLN
OROMUCOSAL | Status: AC
  Administered 2023-08-18: 15 mL
  Filled 2023-08-18: qty 15

## 2023-08-18 MED ORDER — LACTATED RINGERS IV SOLN
INTRAVENOUS | Status: DC
Start: 2023-08-18 — End: 2023-08-18

## 2023-08-18 MED ORDER — SODIUM CHLORIDE (PF) 0.9 % IJ SOLN
PREFILLED_SYRINGE | INTRAMUSCULAR | Status: DC | PRN
  Administered 2023-08-18: 4 mL

## 2023-08-18 MED ORDER — EPINEPHRINE 1 MG/10ML IJ SOSY
PREFILLED_SYRINGE | INTRAMUSCULAR | Status: AC
  Filled 2023-08-18: qty 10

## 2023-08-18 MED ORDER — OXYCODONE HCL 5 MG PO TABS: 5.0000 mg | ORAL_TABLET | Freq: Once | ORAL | Status: DC | PRN

## 2023-08-18 MED ORDER — OXYCODONE HCL 5 MG/5ML PO SOLN: 5.0000 mg | Freq: Once | ORAL | Status: DC | PRN

## 2023-08-18 MED ORDER — LIDOCAINE 2% (20 MG/ML) 5 ML SYRINGE
INTRAMUSCULAR | Status: DC | PRN
  Administered 2023-08-18: 60 mg via INTRAVENOUS

## 2023-08-18 MED ORDER — INSULIN ASPART 100 UNIT/ML IJ SOLN
0.0000 [IU] | INTRAMUSCULAR | Status: AC | PRN
  Administered 2023-08-18: 2 [IU] via SUBCUTANEOUS
  Filled 2023-08-18: qty 0.07
  Filled 2023-08-18: qty 1
  Filled 2023-08-18: qty 0.07

## 2023-08-18 MED ORDER — INSULIN ASPART 100 UNIT/ML IJ SOLN
INTRAMUSCULAR | Status: AC
  Administered 2023-08-18: 3 [IU] via SUBCUTANEOUS
  Filled 2023-08-18: qty 1

## 2023-08-18 MED ORDER — SUGAMMADEX SODIUM 200 MG/2ML IV SOLN
INTRAVENOUS | Status: DC | PRN
  Administered 2023-08-18: 200 mg via INTRAVENOUS

## 2023-08-18 MED ORDER — PROPOFOL 10 MG/ML IV BOLUS
INTRAVENOUS | Status: DC | PRN
  Administered 2023-08-18: 125 ug/kg/min via INTRAVENOUS
  Administered 2023-08-18: 100 mg via INTRAVENOUS

## 2023-08-18 MED ORDER — FENTANYL CITRATE (PF) 100 MCG/2ML IJ SOLN: 25.0000 ug | INTRAMUSCULAR | Status: DC | PRN

## 2023-08-18 MED ORDER — PHENYLEPHRINE HCL-NACL 20-0.9 MG/250ML-% IV SOLN
INTRAVENOUS | Status: DC | PRN
  Administered 2023-08-18: 40 ug/min via INTRAVENOUS

## 2023-08-18 MED ORDER — ONDANSETRON HCL 4 MG/2ML IJ SOLN
INTRAMUSCULAR | Status: DC | PRN
  Administered 2023-08-18: 4 mg via INTRAVENOUS

## 2023-08-18 NOTE — Transfer of Care (Signed)
 Immediate Anesthesia Transfer of Care Note  Patient: Pamela [name]  Procedure(s) Performed: BRONCHOSCOPY, WITH FLUOROSCOPY BRONCHOSCOPY, WITH BRUSH BIOPSY BRONCHOSCOPY, WITH BIOPSY  Patient Location: PACU  Anesthesia Type:General  Level of Consciousness: awake, oriented, and drowsy  Airway & Oxygen Therapy: Patient connected to face mask oxygen  Post-op Assessment: Report given to RN, Post -op Vital signs reviewed and stable, and Patient moving all extremities  Post vital signs: Reviewed and stable  Last Vitals:  Vitals Value Taken Time  BP 128/80 08/18/23 1135  Temp 36.8 C 08/18/23 1135  Pulse 74 08/18/23 1140  Resp 22 08/18/23 1140  SpO2 92 % 08/18/23 1140  Vitals shown include unfiled device data.  Last Pain:  Vitals:   08/18/23 1135  TempSrc:   PainSc: 0-No pain         Complications: No notable events documented.

## 2023-08-18 NOTE — Discharge Instructions (Addendum)
 Flexible Bronchoscopy, Care After This sheet gives you information about how to care for yourself after your test. Your doctor may also give you more specific instructions. If you have problems or questions, contact your doctor. Follow these instructions at home: Eating and drinking When your numbness is gone and your cough and gag reflexes have come back, you may: Eat only soft foods. Slowly drink liquids. When you get home after the test, go back to your normal diet. Driving Do not drive for 24 hours if you were given a medicine to help you relax (sedative). Do not drive or use heavy machinery while taking prescription pain medicine. General instructions  Take over-the-counter and prescription medicines only as told by your doctor. Return to your normal activities as told. Ask what activities are safe for you. Do not use any products that have nicotine or tobacco in them. This includes cigarettes and e-cigarettes. If you need help quitting, ask your doctor. Keep all follow-up visits as told by your doctor. This is important. It is very important if you had a tissue sample (biopsy) taken. Get help right away if: You have shortness of breath that gets worse. You get light-headed. You feel like you are going to pass out (faint). You have chest pain. You cough up: More than a little blood. More blood than before. Summary Do not eat or drink anything (not even water) for 2 hours after your test, or until your numbing medicine wears off. Do not use cigarettes. Do not use e-cigarettes. Get help right away if you have chest pain.  Please call our office for any questions or concerns.  [phone].  This information is not intended to replace advice given to you by your health care provider. Make sure you discuss any questions you have with your health care provider. Document Released: 02/24/2009 Document Revised: 04/11/2017 Document Reviewed: 05/17/2016 Elsevier Patient Education  2020  [employer].

## 2023-08-18 NOTE — Anesthesia Procedure Notes (Signed)
 Procedure Name: Intubation Date/Time: 08/18/2023 10:48 AM  Performed by: Kayleen Memos, CRNAPre-anesthesia Checklist: Patient identified, Emergency Drugs available, Suction available and Patient being monitored Patient Re-evaluated:Patient Re-evaluated prior to induction Oxygen Delivery Method: Circle System Utilized Preoxygenation: Pre-oxygenation with 100% oxygen Induction Type: IV induction Ventilation: Mask ventilation without difficulty Laryngoscope Size: Glidescope and 3 (elective) Grade View: Grade I Tube type: Oral Tube size: 8.5 mm Number of attempts: 1 Airway Equipment and Method: Stylet and Oral airway Placement Confirmation: ETT inserted through vocal cords under direct vision, positive ETCO2 and breath sounds checked- equal and bilateral Secured at: 21 cm Tube secured with: Tape Dental Injury: Teeth and Oropharynx as per pre-operative assessment

## 2023-08-18 NOTE — Interval H&P Note (Signed)
 History and Physical Interval Note:  08/18/2023 8:46 AM  Pamela [name]  has presented today for surgery, with the diagnosis of LEFT LUNG MASS.  The various methods of treatment have been discussed with the patient and family. After consideration of risks, benefits and other options for treatment, the patient has consented to  Procedure(s) with comments: BRONCHOSCOPY, WITH BIOPSY USING ELECTROMAGNETIC NAVIGATION (Left) - WITH FLOURO ENDOBRONCHIAL ULTRASOUND (EBUS) (Left) as a surgical intervention.  The patient's history has been reviewed, patient examined, no change in status, stable for surgery.  I have reviewed the patient's chart and labs.  Questions were answered to the patient's satisfaction.     Leslye Peer

## 2023-08-18 NOTE — Op Note (Signed)
 Video Bronchoscopy Procedure Note  Date of Operation: 08/18/2023  Pre-op Diagnosis: Left lower lobe mass  Post-op Diagnosis: Same  Surgeon: Levy Pupa  Assistants: none  Anesthesia: General anesthesia  Operation: Flexible video fiberoptic bronchoscopy and biopsies.  Estimated Blood Loss: 20 cc  Complications: none noted  Indications and History: Pamela [name] is 73 y.o. with history of tobacco use, COPD.  She was evaluated for hemoptysis and found to have a large mixed density left lower lobe mass on chest imaging.  Recommendation was to perform video fiberoptic bronchoscopy with biopsies. The risks, benefits, complications, treatment options and expected outcomes were discussed with the patient.  The possibilities of pneumothorax, pneumonia, reaction to medication, pulmonary aspiration, perforation of a viscus, bleeding, failure to diagnose a condition and creating a complication requiring transfusion or operation were discussed with the patient who freely signed the consent.    Description of Procedure: The patient was seen in the Preoperative Area, was examined and was deemed appropriate to proceed.  The patient was taken to [facility] endoscopy room 3, identified as Pamela [name] and the procedure verified as Flexible Video Fiberoptic Bronchoscopy.  A Time Out was held and the above information confirmed.   General anesthesia was initiated and the patient was endotracheally intubated.  The video fiberoptic bronchoscope was introduced via the ET tube and a general inspection was performed which showed normal trachea, normal main carina. The R sided airways were inspected and showed normal RUL, BI, RML and RLL. The L side was then inspected. The Lingular and LUL airways were normal.  There was some mucus draining from the left lower lobe superior segmental airways as well as some of the other segmental airways.  There is no discrete endobronchial lesion seen.  Under fluoroscopic  guidance transbronchial brushings and transbronchial forceps biopsies were performed in the superior segmental left lower lobe airway and another narrowed left lower lobe segmental airway. There was some initial moderate bleeding that stopped quickly.  Cold saline was instilled and 1: 10,000 dilution epinephrine was introduced, 4 cc total.  The patient tolerated the procedure well. The bronchoscope was removed. There were no obvious complications.   Samples: 1. Transbronchial brushings from left lower lobe mass 2.  Transbronchial forceps biopsies from left lower lobe mass   Plans:  We will review the cytology, pathology and microbiology results with the patient when they become available.  Outpatient followup will be with Dr Delton Coombes and Saralyn Pilar, NP.    Levy Pupa, MD, PhD 08/18/2023, 11:24 AM [facility] [phone] or if no answer [phone]

## 2023-08-19 NOTE — Anesthesia Postprocedure Evaluation (Signed)
 Anesthesia Post Note  Patient: Pamela [name]  Procedure(s) Performed: BRONCHOSCOPY, WITH FLUOROSCOPY BRONCHOSCOPY, WITH BRUSH BIOPSY BRONCHOSCOPY, WITH BIOPSY     Patient location during evaluation: PACU Anesthesia Type: General Level of consciousness: awake and alert Pain management: pain level controlled Vital Signs Assessment: post-procedure vital signs reviewed and stable Respiratory status: spontaneous breathing, nonlabored ventilation, respiratory function stable and patient connected to nasal cannula oxygen Cardiovascular status: blood pressure returned to baseline and stable Postop Assessment: no apparent nausea or vomiting Anesthetic complications: no   No notable events documented.  Last Vitals:  Vitals:   08/18/23 1145 08/18/23 1200  BP: (!) 121/54 (!) 142/64  Pulse: 73 75  Resp: 16 (!) 21  Temp:  36.8 C  SpO2: 90% 92%    Last Pain:  Vitals:   08/18/23 1135  TempSrc:   PainSc: 0-No pain                 Letcher Schweikert S

## 2023-08-20 ENCOUNTER — Encounter (HOSPITAL_COMMUNITY): Payer: Self-pay | Admitting: Emergency Medicine

## 2023-08-25 ENCOUNTER — Other Ambulatory Visit: Payer: Medicare Other

## 2023-08-27 ENCOUNTER — Telehealth: Payer: Self-pay

## 2023-08-27 ENCOUNTER — Ambulatory Visit: Admitting: Acute Care

## 2023-08-27 ENCOUNTER — Encounter: Payer: Self-pay | Admitting: Acute Care

## 2023-08-27 ENCOUNTER — Telehealth: Payer: Self-pay | Admitting: Radiation Oncology

## 2023-08-27 VITALS — BP 149/81 | HR 93 | Ht 61.0 in | Wt 168.0 lb

## 2023-08-27 DIAGNOSIS — Z9889 Other specified postprocedural states: Secondary | ICD-10-CM | POA: Diagnosis not present

## 2023-08-27 DIAGNOSIS — C3432 Malignant neoplasm of lower lobe, left bronchus or lung: Secondary | ICD-10-CM

## 2023-08-27 DIAGNOSIS — C3490 Malignant neoplasm of unspecified part of unspecified bronchus or lung: Secondary | ICD-10-CM

## 2023-08-27 DIAGNOSIS — J438 Other emphysema: Secondary | ICD-10-CM

## 2023-08-27 DIAGNOSIS — Z87891 Personal history of nicotine dependence: Secondary | ICD-10-CM | POA: Diagnosis not present

## 2023-08-27 DIAGNOSIS — J449 Chronic obstructive pulmonary disease, unspecified: Secondary | ICD-10-CM | POA: Diagnosis not present

## 2023-08-27 NOTE — Telephone Encounter (Signed)
 Copied from CRM [id number]. Topic: Clinical - Lab/Test Results >> Aug 27, 2023  4:22 PM Alverda Joe S wrote: Reason for CRM: patient want to know how long will she have to wait for her pet scan results after she have it on 04/24.  Typically, CT results take up to 2-3 weeks before we can receive. I called and spoke to pt. Pt has a PET scan scheduled for 09-04-23. Dara Ear, NP ordered this and stated the pt needs to f/u with her in 1 week after the scan to review results. Please call and have pt scheduled 1 week after April 24th to see Sarah.

## 2023-08-27 NOTE — Telephone Encounter (Signed)
 4/16 @ 3:51 pm Left voicemail for patient to call our office to be schedule for consult.

## 2023-08-27 NOTE — Patient Instructions (Signed)
 It is good to see you today. I am glad you have done well after the bronchoscopy with biopsies. 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 I have ordered a PET scan, and MRI of the Brain and Pulmonary Function tests to complete your staging.  You will get calls to get these scheduled.  I have referred you to medical oncology and radiation oncology. You will get calls to get these consults scheduled. If PET scan and PFT's support surgery as an option , we will refer you to thoracic surgery at that time.  Follow up with me after PET scan to review results. Use your Anoro and rescue inhaler as you have done before the biopsy Do not use them the day of your Pulmonary function tests.  Call if you need us  sooner.  Please contact office for sooner follow up if symptoms do not improve or worsen or seek emergency care

## 2023-08-27 NOTE — Progress Notes (Signed)
 History of Present Illness Pamela [name] is a 74 y.o. female former  smoker with COPD and a kidney transplant referred , 08/2023 for finding of a lung mass on CXR with hemoptysis and worsening dyspnea.. She will be followed by Dr. Shelah.   PMH - History of hypertension - History of kidney disease - History of COPD since 2004 - History of shortness of breath - History of diabetes   08/27/2023 Pt. Presents for follow up after 08/15/2023 visit for hemoptysis and worsening dyspnea. CT Chest at the time showed a large solid mass in the left lower lobe of her lung, obstructing the left main bronchus, that is most likely impacting her dyspnea.Plan was for robotic assisted navigational bronchoscopy with biopsies and EBUS . Hope was for Dr. Shelah to do some debulking during bronchoscopy to open up her airway.  She is here today to review the results of her biopsies, and to see how she is doing post procedure. She states did well postprocedure.  She denies any bleeding, fever, discolored secretions, worsening shortness of breath, or first reaction to anesthesia.  We have reviewed the results of her biopsies.  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  We discussed that this is a non-small cell lung cancer.  Plan is to complete staging.  I have ordered a PET scan, MRI of the brain, and pulmonary function tests.  I have made referrals to both medical oncology and radiation oncology.  I have encouraged her to continue to use her Anoro and her albuterol  as needed for shortness of breath or wheezing.  I am hopeful we can get additional information with her pulmonary function testing to escalate her inhalers as is indicated.   Test Results: Cytology 08/18/2023  CYTOLOGY - NON PAP THIS IS AN AMENDED REPORT  CASE: [id number] PATIENT: Pamela [name] Non-Gynecological Cytology Report  Amendment   Reason for Amendment #1: Immunohistochemistry results  Clinical History: Left  lung mass    FINAL MICROSCOPIC DIAGNOSIS:  A. LUNG, LLL, BRUSHING: - Suspicious for malignancy - Abundant necrosis  B. LUNG, LLL, BIOPSY: - Squamous cell carcinoma     08/08/2023 CT Chest   Mediastinum/Nodes: No enlarged mediastinal or supraclavicular nodes. Homogeneous low-density lesion in the LEFT lobe of the thyroid gland measures 1.8 cm. No axillary adenopathy.   Mildly enlarged LEFT hilar node measures 12 mm image 61.   Lungs/Pleura: Large round mass in the LEFT lower lobe surrounds and obstructs the LEFT lower lobe bronchus. Mass measures 6.8 by 5.9 cm (image 61/3) and is solid. Centrilobular emphysema the upper lobes. 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 Mass surrounds and obstructs the LEFT lower lobe bronchus. 2. Mildly enlarged LEFT hilar node. 3. No evidence of metastatic adenopathy in the mediastinum. 4. No evidence of metastatic disease in the upper abdomen. 5. Benign appearing low-density lesion in the LEFT lobe of the thyroid gland. No specific follow-up recommended       Latest Ref Rng & Units 08/18/2023    8:09 AM 11/24/2017    8:10 AM 11/24/2017    6:05 AM  CBC  WBC 4.0 - 10.5 K/uL 10.1  14.5  15.3   Hemoglobin 12.0 - 15.0 g/dL 85.7  88.9  88.5   Hematocrit 36.0 - 46.0 % 45.1  34.2  35.1   Platelets 150 - 400 K/uL 242  249  208        Latest Ref Rng &  Units 08/18/2023    8:09 AM 08/08/2023   11:27 AM 11/24/2017    8:10 AM  BMP  Glucose 70 - 99 mg/dL 746   702   BUN 8 - 23 mg/dL 10   73   Creatinine 9.55 - 1.00 mg/dL 9.13  9.19  2.67   Sodium 135 - 145 mmol/L 137   136   Potassium 3.5 - 5.1 mmol/L 4.2   4.0   Chloride 98 - 111 mmol/L 103   93   CO2 22 - 32 mmol/L 22   29   Calcium  8.9 - 10.3 mg/dL 9.2   9.6     BNP    Component Value Date/Time   BNP 2,160.6 (H) 01/14/2015 1440    ProBNP    Component Value Date/Time   PROBNP 284.0 (H) 03/25/2014 0918    PFT    Component Value  Date/Time   FEV1PRE 0.75 12/24/2016 1038   FEV1POST 0.79 12/24/2016 1038   FVCPRE 1.58 12/24/2016 1038   FVCPOST 1.66 12/24/2016 1038   TLC 5.09 12/24/2016 1038   DLCOUNC 11.69 12/24/2016 1038   PREFEV1FVCRT 48 12/24/2016 1038   PSTFEV1FVCRT 47 12/24/2016 1038    DG Chest Port 1 View Result Date: 08/18/2023 CLINICAL DATA:  Status post bronchoscopy with biopsy. EXAM: PORTABLE CHEST 1 VIEW COMPARISON:  08/08/2023 FINDINGS: Stable cardiomediastinal contours. Aortic atherosclerotic calcifications. The left perihilar lung mass is again noted measuring approximately 7.5 cm. No pneumothorax identified status post bronchoscopy and biopsy. Right lung is clear. IMPRESSION: 1. No pneumothorax status post bronchoscopy and biopsy. 2. Left perihilar lung mass. Electronically Signed   By: Waddell Calk M.D.   On: 08/18/2023 12:21   DG C-ARM BRONCHOSCOPY Result Date: 08/18/2023 C-ARM BRONCHOSCOPY: Fluoroscopy was utilized by the requesting physician.  No radiographic interpretation.   CT CHEST W CONTRAST Result Date: 08/08/2023 CLINICAL DATA:  LEFT lung mass on chest radiograph EXAM: CT CHEST WITH CONTRAST TECHNIQUE: Multidetector CT imaging of the chest was performed during intravenous contrast administration. RADIATION DOSE REDUCTION: This exam was performed according to the departmental dose-optimization program which includes automated exposure control, adjustment of the mA and/or kV according to patient size and/or use of iterative reconstruction technique. CONTRAST:  75mL OMNIPAQUE  IOHEXOL  350 MG/ML SOLN COMPARISON:  Radiograph 08/06/2023 FINDINGS: Cardiovascular: No significant vascular findings. Normal heart size. No pericardial effusion. Mediastinum/Nodes: No enlarged mediastinal or supraclavicular nodes. Homogeneous low-density lesion in the LEFT lobe of the thyroid gland measures 1.8 cm. No axillary adenopathy. Mildly enlarged LEFT hilar node measures 12 mm image 61. Lungs/Pleura: Large round mass in the  LEFT lower lobe surrounds and obstructs the LEFT lower lobe bronchus. Mass measures 6.8 by 5.9 cm (image 61/3) and is solid. Centrilobular emphysema the upper lobes. Upper Abdomen: Adrenal glands are normal. No hepatic metastasis identified. There is mild extrahepatic biliary duct dilatation. Patient status post cholecystectomy. Musculoskeletal: No aggressive osseous lesion. IMPRESSION: 1. 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 Mass surrounds and obstructs the LEFT lower lobe bronchus. 2. Mildly enlarged LEFT hilar node. 3. No evidence of metastatic adenopathy in the mediastinum. 4. No evidence of metastatic disease in the upper abdomen. 5. Benign appearing low-density lesion in the LEFT lobe of the thyroid gland. No specific follow-up recommended Electronically Signed   By: Jackquline Boxer M.D.   On: 08/08/2023 14:09     Past medical hx Past Medical History:  Diagnosis Date   Anemia    Anxiety  CHF (congestive heart failure) (HCC)    Chronic kidney disease    COPD (chronic obstructive pulmonary disease) (HCC)    Coronary artery disease    Diabetes mellitus without complication (HCC) 10-29-12   Headache    Hypertension    PONV (postoperative nausea and vomiting)    Shortness of breath dyspnea      Social History   Tobacco Use   Smoking status: Former    Current packs/day: 0.00    Types: Cigarettes    Quit date: 10/29/1992    Years since quitting: 30.8   Smokeless tobacco: Never  Vaping Use   Vaping status: Never Used  Substance Use Topics   Alcohol use: No    Alcohol/week: 0.0 standard drinks of alcohol   Drug use: No    Ms.Haertel reports that she quit smoking about 30 years ago. Her smoking use included cigarettes. She has never used smokeless tobacco. She reports that she does not drink alcohol and does not use drugs.  Tobacco Cessation: Counseling given: Not Answered Former smoker quit smoking in 1994 She smoked 1.5  PPD x 31.5 years for a 47.25 pack year smoking history.  Past surgical hx, Family hx, Social hx all reviewed.  Current Outpatient Medications on File Prior to Visit  Medication Sig   Accu-Chek Softclix Lancets lancets DX: E11.21 as directed   acetaminophen  (TYLENOL ) 650 MG CR tablet Take by mouth.   albuterol  (VENTOLIN  HFA) 108 (90 Base) MCG/ACT inhaler 1 puff as needed.   amLODipine  (NORVASC ) 10 MG tablet Take 10 mg by mouth daily.   aspirin  EC 81 MG tablet Take 81 mg by mouth daily.   atorvastatin  (LIPITOR) 10 MG tablet Take 1 tablet (10 mg total) by mouth daily.   Blood Glucose Calibration (ACCU-CHEK AVIVA) SOLN DX: E11.21 as directed   carvedilol  (COREG ) 12.5 MG tablet Take 1 tablet (12.5 mg total) by mouth 2 (two) times daily.   COVID-19 mRNA bivalent vaccine, Pfizer, (PFIZER COVID-19 VAC BIVALENT) injection Inject into the muscle.   COVID-19 mRNA Vac-TriS, Pfizer, (PFIZER-BIONT COVID-19 VAC-TRIS) SUSP injection Inject into the muscle.   glimepiride  (AMARYL ) 2 MG tablet Take 2 mg by mouth daily.   glucose blood (ACCU-CHEK AVIVA PLUS) test strip use as directed to check  blood sugar 4 times daily   influenza vaccine adjuvanted (FLUAD ) 0.5 ML injection Inject into the muscle.   insulin  glargine (LANTUS ) 100 UNIT/ML injection Inject 10 Units into the skin at bedtime.   insulin  glargine (LANTUS ) 100 UNIT/ML injection Inject 30 Units into the skin every morning.   insulin  lispro (HUMALOG) 100 UNIT/ML KiwkPen Inject into the skin. Take 4 units in the skin in the a.m Take 6 units in the skin at lunch Take 8 units in the skin at supper   latanoprost  (XALATAN ) 0.005 % ophthalmic solution Place 1 drop into both eyes at bedtime.   mycophenolate (CELLCEPT) 250 MG capsule Take 750 mg by mouth.    nitroGLYCERIN  (NITROSTAT ) 0.4 MG SL tablet Place 0.4 mg under the tongue every 5 (five) minutes as needed for chest pain.   predniSONE  (DELTASONE ) 5 MG tablet Take 5 mg by mouth daily with breakfast.     Tacrolimus  1 MG TB24 Take 1 mg by mouth daily.   tacrolimus  ER (ENVARSUS  XR) 1 MG TB24 Take 1 mg by mouth daily.   umeclidinium-vilanterol (ANORO ELLIPTA) 62.5-25 MCG/INH AEPB Inhale 1 puff into the lungs daily.    No current facility-administered medications on file prior to visit.  No Known Allergies  Review Of Systems:  Constitutional:   +  weight loss, No night sweats,  Fevers, chills, fatigue, or  lassitude.  HEENT:   No headaches,  Difficulty swallowing,  Tooth/dental problems, or  Sore throat,                No sneezing, itching, ear ache, nasal congestion, post nasal drip,   CV:  No chest pain,  Orthopnea, PND, swelling in lower extremities, anasarca, dizziness, palpitations, syncope.   GI  No heartburn, indigestion, abdominal pain, nausea, vomiting, diarrhea, change in bowel habits, loss of appetite, bloody stools.   Resp: + shortness of breath with exertion less at rest.  No excess mucus, no productive cough,  No non-productive cough,  No coughing up of blood.  No change in color of mucus.  No wheezing.  No chest wall deformity  Skin: no rash or lesions.  GU: no dysuria, change in color of urine, no urgency or frequency.  No flank pain, no hematuria   MS:  No joint pain or swelling.  No decreased range of motion.  No back pain.  Psych:  No change in mood or affect. No depression or anxiety.  No memory loss.   Vital Signs BP (!) 149/81 (BP Location: Right Arm, Cuff Size: Normal)   Pulse 93   Ht 5' 1 (1.549 m)   Wt 168 lb (76.2 kg)   SpO2 90%   BMI 31.74 kg/m    Physical Exam:  General- No distress,  A&Ox3, pleasant and appropriate ENT: No sinus tenderness, TM clear, pale nasal mucosa, no oral exudate,no post nasal drip, no LAN Cardiac: S1, S2, regular rate and rhythm, no murmur Chest: No wheeze/ rales/ dullness; no accessory muscle use, no nasal flaring, no sternal retractions, slightly diminished per bases Abd.: Soft Non-tender, nondistended, bowel sounds  positive,Body mass index is 31.74 kg/m.  Ext: No clubbing cyanosis, edema, no obvious deformities Neuro: Physical deconditioning, moving all extremities x 4, alert and oriented x 3 Skin: No rashes, warm and dry, no obvious skin lesions Psych: normal mood and behavior, mood is appropriate for learning about her new diagnosis.   Assessment/Plan Post bronchoscopy with biopsies New diagnosis squamous cell lung cancer Dyspnea on exertion  Plan I am glad you have done well after the bronchoscopy with biopsies. 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 I have ordered a PET scan, and MRI of the Brain and Pulmonary Function tests to complete your staging.  You will get calls to get these scheduled.  I have referred you to medical oncology and radiation oncology. You will get calls to get these consults scheduled. If PET scan and PFT's support surgery as an option , we will refer you to thoracic surgery at that time.  Follow up with me after PET scan to review results. Use your Anoro and rescue inhaler as you have done before the biopsy Do not use them the day of your Pulmonary function tests.  Call if you need us  sooner.  Please contact office for sooner follow up if symptoms do not improve or worsen or seek emergency care    I spent 25 minutes dedicated to the care of this patient on the date of this encounter to include pre-visit review of records, face-to-face time with the patient discussing conditions above, post visit ordering of testing, clinical documentation with the electronic health record, making appropriate referrals as documented, and communicating necessary information to the patient's healthcare team.  Pamela JULIANNA Lites, NP 08/27/2023  2:31 PM

## 2023-08-28 ENCOUNTER — Other Ambulatory Visit: Payer: Self-pay

## 2023-08-28 LAB — CYTOLOGY - NON PAP

## 2023-09-01 ENCOUNTER — Encounter (HOSPITAL_COMMUNITY): Payer: Self-pay

## 2023-09-01 NOTE — Progress Notes (Addendum)
 I reached out to the pt to introduce myself and explain my role as her nurse navigator. I offered the pt an appt on 4/29 at 11;15 for labs and 11:45. Pt agreed to appt offered. Scheduler notified via [place]. Pt denied questions about the location of the cancer center. Pt is aware that the labs she needs drawn do not require fasting. Pt verbalized understanding of information provided.

## 2023-09-01 NOTE — Progress Notes (Signed)
 The proposed treatment discussed in conference is for discussion purpose only and is not a binding recommendation.  The patients have not been physically examined, or presented with their treatment options.  Therefore, final treatment plans cannot be decided.

## 2023-09-03 ENCOUNTER — Telehealth: Payer: Self-pay | Admitting: Medical Oncology

## 2023-09-03 NOTE — Telephone Encounter (Signed)
 I told pt we could not accommodate moving her new pt appt.

## 2023-09-03 NOTE — Progress Notes (Signed)
 Location of tumor and Histology per Pathology Report:  Left lung  Biopsy:    Past/Anticipated interventions by surgeon, if any: left    Past/Anticipated interventions by medical oncology, if any: Patient to follow up with Dr. Liam Redhead on 09/09/23    Pain issues, if any:  Denies   SAFETY ISSUES: Prior radiation? Yes, she reports having radiation to remove a birthmark in 1951. Pacemaker/ICD? No, she reports having a stent in her heart and a dialysis shunt in arm. Possible current pregnancy? N/A Is the patient on methotrexate? No  Current Complaints / other details:  She wants to be educated about having radiation.  BP (!) 153/80 (BP Location: Right Arm, Patient Position: Sitting, Cuff Size: Large)   Pulse 85   Temp (!) 96.8 F (36 C)   Resp 20   Ht 5' 1.5" (1.562 m)   Wt 167 lb 9.6 oz (76 kg)   SpO2 93%   BMI 31.15 kg/m

## 2023-09-04 ENCOUNTER — Ambulatory Visit (HOSPITAL_COMMUNITY)
Admission: RE | Admit: 2023-09-04 | Discharge: 2023-09-04 | Disposition: A | Discharge status: Home / Self Care | Source: Ambulatory Visit | Attending: Acute Care | Admitting: Acute Care

## 2023-09-04 ENCOUNTER — Telehealth: Payer: Self-pay | Admitting: *Deleted

## 2023-09-04 DIAGNOSIS — C3490 Malignant neoplasm of unspecified part of unspecified bronchus or lung: Secondary | ICD-10-CM

## 2023-09-04 DIAGNOSIS — C3432 Malignant neoplasm of lower lobe, left bronchus or lung: Secondary | ICD-10-CM | POA: Insufficient documentation

## 2023-09-04 DIAGNOSIS — I639 Cerebral infarction, unspecified: Secondary | ICD-10-CM | POA: Diagnosis not present

## 2023-09-04 DIAGNOSIS — R918 Other nonspecific abnormal finding of lung field: Secondary | ICD-10-CM | POA: Diagnosis not present

## 2023-09-04 LAB — GLUCOSE, CAPILLARY: Glucose-Capillary: 198 mg/dL — ABNORMAL HIGH (ref 70–99)

## 2023-09-04 MED ORDER — GADOBUTROL 1 MMOL/ML IV SOLN
7.0000 mL | Freq: Once | INTRAVENOUS | Status: AC | PRN
  Administered 2023-09-04: 7 mL via INTRAVENOUS

## 2023-09-04 MED ORDER — FLUDEOXYGLUCOSE F - 18 (FDG) INJECTION
8.4000 | Freq: Once | INTRAVENOUS | Status: AC
  Administered 2023-09-04: 8.4 via INTRAVENOUS

## 2023-09-04 NOTE — Telephone Encounter (Signed)
 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   No abnormal enhancement to suggest metastatic disease.  PET scan results still pending.

## 2023-09-04 NOTE — Telephone Encounter (Signed)
 Copied from CRM [id number]. Topic: Appointments - Scheduling Inquiry for Clinic >> Sep 04, 2023  1:38 PM Crist Dominion wrote: Reason for CRM: Patients husband Darcia Lampi would like to discuss with Patrisha Boot nurse on what exactly the patient needs to be scheduled for and what the time frames are for these appointments as there is conflicting information in patients MyChart and he would like to discuss with a nurse.

## 2023-09-05 ENCOUNTER — Telehealth: Payer: Self-pay

## 2023-09-05 NOTE — Telephone Encounter (Signed)
 Returned call. Spoke with patient. Advises she just wanted to confirm f/u appt with Margit Shelling, NP on 09/12/2023 to review PET results.

## 2023-09-06 NOTE — Progress Notes (Signed)
 Radiation Oncology         [phone] ________________________________  Initial Outpatient Consultation  Name: Pamela [name] MRN: [id number]  Date: 09/08/2023  DOB: 1950-08-01  NW:[id number], Lesia Raring, MD  Denson Flake, MD   REFERRING PHYSICIAN: Denson Flake, MD  DIAGNOSIS: There were no encounter diagnoses.  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 female former smoker with a past medical history of COPD, hypertension, diabetes, and a history of kidney disease; s/p kidney transplant in 2018. (She has been on immunosuppressant medication since her transplant as well). Today, she is accompanied by ***. she is seen as a courtesy of Dr. Byrum for an opinion concerning radiation therapy as part of management for her recently diagnosed left lung cancer.   The patient presented to her PCP, Dr. Bryon Caraway, on 08/06/23 with c/o increased shortness of breath and a cough with hemoptysis for approximately two weeks. Per encounter notes, a chest x-ray was performed at that time which showed concern for a lung mass (however this report is not available).   She was accordingly referred to radiology and had a chest CT performed on 08/08/23 which further revealed: a large solid mass in the left lower lobe measuring 6.8 x 5.9 cm concerning for primary bronchogenic carcinoma. The mass also appeared to surround and obstruct the LLL bronchus. A mildly enlarged left hilar lymph node was also demonstrated. CT otherwise showed no evidence of metastatic adenopathy in the mediastinum or evidence of metastatic disease in the upper abdomen. (Other findings of potential clinical significance included a benign appearing low density lesion in the left lobe of the thyroid gland).   She was promptly referred to Dr. Baldwin Levee and opted to proceed with a bronchoscopy on 08/18/23. Biopsy of the LLL  collected at that time showed findings consistent with squamous cell carcinoma, with dedifferentiation into a focus of small cell carcinoma. (LLL brushings were also submitted for cytology and were suspicious of malignancy).   To complete her staging work-up, a PET scan was performed on 09/04/23 which redemonstrated the large cavitary mass centered along the superior segment of the LLL, abutting the hilum, and with marginal abnormal radiotracer uptake (SUV max of 11.2). Focal uptake was also demonstrated along the known mildly enlarged left hilar lymph node concerning for metastatic disease. PET findings thankfully showed no other sites concerning for metastatic disease.   An MRI of the brain was also performed that same day (09/04/23) and showed no evidence of intracranial metastatic disease. (Of note: MRI findings of potential clinical significance included small recent infarcts at the bilateral upper cerebrum).   She has been referred to medical oncology and is scheduled to meet with Dr. Marguerita Shih in consultation on 09/09/23. She is also scheduled to undergo PFT to determine her surgical candidacy - A referral will be placed to cardiothoracic surgery pending PFT results.   PREVIOUS RADIATION THERAPY: No  PAST MEDICAL HISTORY:  Past Medical History:  Diagnosis Date   Anemia    Anxiety    CHF (congestive heart failure) (HCC)    Chronic kidney disease    COPD (chronic obstructive pulmonary disease) (HCC)    Coronary artery disease    Diabetes mellitus without complication (HCC) 10-29-12   Headache    Hypertension    PONV (postoperative nausea and vomiting)    Shortness of breath dyspnea     PAST SURGICAL HISTORY:  Past Surgical History:  Procedure Laterality Date   ABDOMINAL HYSTERECTOMY     ANAL FISSURE REPAIR     APPENDECTOMY     '74- open with gallbladder   AV FISTULA PLACEMENT Left 01/31/2015   Procedure: ARTERIOVENOUS FISTULA CREATION-LEFT BRACHIO-CEPHALIC ;  Surgeon: Arvil Lauber, MD;  Location: [facility] OR;  Service: Vascular;  Laterality: Left;   AV FISTULA PLACEMENT Left 04/04/2015   Procedure: INSERTION OF ARTERIOVENOUS (AV) GORE-TEX GRAFT ARM;  Surgeon: Richrd Char, MD;  Location: [facility] OR;  Service: Vascular;  Laterality: Left;   BREAST EXCISIONAL BIOPSY Left    BREAST SURGERY     cyst removed   BRONCHIAL BIOPSY  08/18/2023   Procedure: BRONCHOSCOPY, WITH BIOPSY;  Surgeon: Denson Flake, MD;  Location: [facility] ENDOSCOPY;  Service: Pulmonary;;   BRONCHIAL BRUSHINGS  08/18/2023   Procedure: BRONCHOSCOPY, WITH BRUSH BIOPSY;  Surgeon: Denson Flake, MD;  Location: [facility] ENDOSCOPY;  Service: Pulmonary;;   CARDIAC CATHETERIZATION     1 coronary stent placed   CHOLECYSTECTOMY     '74-open   COLONOSCOPY WITH PROPOFOL  N/A 11/17/2012   Procedure: COLONOSCOPY WITH PROPOFOL ;  Surgeon: Garrett Kallman, MD;  Location: [facility] ENDOSCOPY;  Service: Endoscopy;  Laterality: N/A;   CORONARY STENT PLACEMENT     ELBOW SURGERY Right    tendon surgery   ESOPHAGOGASTRODUODENOSCOPY (EGD) WITH PROPOFOL  N/A 11/17/2012   Procedure: ESOPHAGOGASTRODUODENOSCOPY (EGD) WITH PROPOFOL ;  Surgeon: Garrett Kallman, MD;  Location: [facility] ENDOSCOPY;  Service: Endoscopy;  Laterality: N/A;   GANGLION CYST EXCISION Bilateral 10/29/2012   wrist   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   LEFT HEART CATHETERIZATION WITH CORONARY ANGIOGRAM N/A 04/14/2014   Procedure: LEFT HEART CATHETERIZATION WITH CORONARY ANGIOGRAM;  Surgeon: Mickiel Albany, MD;  Location: [facility] CATH LAB;  Service: Cardiovascular;  Laterality: N/A;   PERIPHERAL VASCULAR CATHETERIZATION N/A 03/09/2015   Procedure: Fistulagram;  Surgeon: Arvil Lauber, MD;  Location: [facility] INVASIVE CV LAB;  Service: Cardiovascular;  Laterality: N/A;   TEE WITHOUT CARDIOVERSION N/A 01/20/2015   Procedure: TRANSESOPHAGEAL ECHOCARDIOGRAM (TEE);  Surgeon: Elmyra Haggard, MD;  Location: [facility] ENDOSCOPY;  Service: Cardiovascular;  Laterality: N/A;   TUBAL LIGATION     VIDEO BRONCHOSCOPY   08/18/2023   Procedure: BRONCHOSCOPY, WITH FLUOROSCOPY;  Surgeon: Denson Flake, MD;  Location: [facility] ENDOSCOPY;  Service: Pulmonary;;    FAMILY HISTORY:  Family History  Problem Relation Age of Onset   Diabetes Father    Cancer Father    Hypertension Father    Heart attack Mother    Hypertension Mother    Diabetes Sister    Hypertension Sister    Cancer Sister    Hypertension Sister    Breast cancer Sister     SOCIAL HISTORY:  Social History   Tobacco Use   Smoking status: Former    Current packs/day: 0.00    Types: Cigarettes    Quit date: 10/29/1992    Years since quitting: 30.8   Smokeless tobacco: Never  Vaping Use   Vaping status: Never Used  Substance Use Topics   Alcohol use: No    Alcohol/week: 0.0 standard drinks of alcohol   Drug use: No    ALLERGIES: No Known Allergies  MEDICATIONS:  Current Outpatient Medications  Medication Sig Dispense Refill   Accu-Chek Softclix Lancets lancets DX: E11.21 as directed     acetaminophen  (TYLENOL ) 650 MG CR tablet Take by mouth.     albuterol  (VENTOLIN  HFA) 108 (90 Base) MCG/ACT  inhaler 1 puff as needed.     amLODipine  (NORVASC ) 10 MG tablet Take 10 mg by mouth daily.     aspirin  EC 81 MG tablet Take 81 mg by mouth daily.     atorvastatin  (LIPITOR) 10 MG tablet Take 1 tablet (10 mg total) by mouth daily. 90 tablet 3   Blood Glucose Calibration (ACCU-CHEK AVIVA) SOLN DX: E11.21 as directed     carvedilol  (COREG ) 12.5 MG tablet Take 1 tablet (12.5 mg total) by mouth 2 (two) times daily. 180 tablet 3   COVID-19 mRNA bivalent vaccine, Pfizer, (PFIZER COVID-19 VAC BIVALENT) injection Inject into the muscle. 0.3 mL 0   COVID-19 mRNA Vac-TriS, Pfizer, (PFIZER-BIONT COVID-19 VAC-TRIS) SUSP injection Inject into the muscle. 0.3 mL 0   glimepiride  (AMARYL ) 2 MG tablet Take 2 mg by mouth daily.     glucose blood (ACCU-CHEK AVIVA PLUS) test strip use as directed to check  blood sugar 4 times daily     influenza vaccine adjuvanted  (FLUAD) 0.5 ML injection Inject into the muscle. 0.5 mL 0   insulin  glargine (LANTUS ) 100 UNIT/ML injection Inject 10 Units into the skin at bedtime.     insulin  glargine (LANTUS ) 100 UNIT/ML injection Inject 30 Units into the skin every morning.     insulin  lispro (HUMALOG) 100 UNIT/ML KiwkPen Inject into the skin. Take 4 units in the skin in the a.m Take 6 units in the skin at lunch Take 8 units in the skin at supper     latanoprost  (XALATAN ) 0.005 % ophthalmic solution Place 1 drop into both eyes at bedtime.     mycophenolate (CELLCEPT) 250 MG capsule Take 750 mg by mouth.      nitroGLYCERIN  (NITROSTAT ) 0.4 MG SL tablet Place 0.4 mg under the tongue every 5 (five) minutes as needed for chest pain.     predniSONE  (DELTASONE ) 5 MG tablet Take 5 mg by mouth daily with breakfast.      Tacrolimus  1 MG TB24 Take 1 mg by mouth daily.     tacrolimus  ER (ENVARSUS  XR) 1 MG TB24 Take 1 mg by mouth daily.     umeclidinium-vilanterol (ANORO ELLIPTA) 62.5-25 MCG/INH AEPB Inhale 1 puff into the lungs daily.      No current facility-administered medications for this encounter.    REVIEW OF SYSTEMS:  A 10+ POINT REVIEW OF SYSTEMS WAS OBTAINED including neurology, dermatology, psychiatry, cardiac, respiratory, lymph, extremities, GI, GU, musculoskeletal, constitutional, reproductive, HEENT. ***   PHYSICAL EXAM:  vitals were not taken for this visit.   General: Alert and oriented, in no acute distress HEENT: Head is normocephalic. Extraocular movements are intact. Oropharynx is clear. Neck: Neck is supple, no palpable cervical or supraclavicular lymphadenopathy. Heart: Regular in rate and rhythm with no murmurs, rubs, or gallops. Chest: Clear to auscultation bilaterally, with no rhonchi, wheezes, or rales. Abdomen: Soft, nontender, nondistended, with no rigidity or guarding. Extremities: No cyanosis or edema. Lymphatics: see Neck Exam Skin: No concerning lesions. Musculoskeletal: symmetric strength and  muscle tone throughout. Neurologic: Cranial nerves II through XII are grossly intact. No obvious focalities. Speech is fluent. Coordination is intact. Psychiatric: Judgment and insight are intact. Affect is appropriate. ***  ECOG = ***  0 - Asymptomatic (Fully active, able to carry on all predisease activities without restriction)  1 - Symptomatic but completely ambulatory (Restricted in physically strenuous activity but ambulatory and able to carry out work of a light or sedentary nature. For example, light housework, office work)  2 -  Symptomatic, <50% in bed during the day (Ambulatory and capable of all self care but unable to carry out any work activities. Up and about more than 50% of waking hours)  3 - Symptomatic, >50% in bed, but not bedbound (Capable of only limited self-care, confined to bed or chair 50% or more of waking hours)  4 - Bedbound (Completely disabled. Cannot carry on any self-care. Totally confined to bed or chair)  5 - Death   Aurea Blossom MM, Creech RH, Tormey DC, et al. [phone]). "Toxicity and response criteria of the [facility]". Am. Hillard Lowes. Oncol. 5 (6): 649-55  LABORATORY DATA:  Lab Results  Component Value Date   WBC 10.1 08/18/2023   HGB 14.2 08/18/2023   HCT 45.1 08/18/2023   MCV 90.9 08/18/2023   PLT 242 08/18/2023   NEUTROABS 10.8 (H) 01/19/2015   Lab Results  Component Value Date   NA 137 08/18/2023   K 4.2 08/18/2023   CL 103 08/18/2023   CO2 22 08/18/2023   GLUCOSE 253 (H) 08/18/2023   BUN 10 08/18/2023   CREATININE 0.86 08/18/2023   CALCIUM  9.2 08/18/2023      RADIOGRAPHY: NM PET Image Initial (PI) Skull Base To Thigh Result Date: 09/05/2023 CLINICAL DATA:  Initial treatment strategy for lung nodule. EXAM: NUCLEAR MEDICINE PET SKULL BASE TO THIGH TECHNIQUE: 8.40 mCi F-18 FDG was injected intravenously. Full-ring PET imaging was performed from the skull base to thigh after the radiotracer. CT data was obtained and used  for attenuation correction and anatomic localization. Fasting blood glucose: 198 mg/dl COMPARISON:  Chest CT with contrast 08/08/2023. FINDINGS: Mediastinal blood pool activity: SUV max 3.5 Liver activity: SUV max 4.0. Neck: Mild physiological pharyngeal uptake. Near symmetric uptake of the visualized intracranial compartment. No specific abnormal uptake elsewhere in the neck including along lymph node change of the submandibular, posterior triangle or internal jugular regions. Incidental CT findings: Visualized portions of the paranasal sinuses and mastoid air cells are clear. The parotid glands, submandibular glands are unremarkable. There is a cystic focus in the left thyroid lobe which again does not show any abnormal uptake but measures up to 18 mm. Confirmatory ultrasound could be considered when clinically appropriate. Scattered vascular calcifications are seen. Chest: Centered in the superior segment of the left lower lobe once again is a large cavitary mass as seen on CT scan. On the prior CT this measured 6.8 x 5.9 cm. Today this has maximum SUV value 11.2. The uptake is along the periphery of lesion most confluent along the inferior aspect as well as medial superiorly. Lesion once again extends from the pleura to the hilum. There is an occluded left lower lobe bronchus identified once again seen such as series CT image 67. No additional areas of abnormal uptake identified in the lungs. There is slight asymmetric uptake seen along the adjacent left lower lobe lung hilum posteriorly the small focus of uptake maximum SUV of 3.7 on image 66. This corresponds to the hilar node described on the CT scan at 12 mm. No other areas of abnormal uptake above blood pool in the axillary region, hilum or mediastinum. Incidental CT findings: Breathing motion. Emphysematous lung changes are identified. No pleural effusion or pneumothorax. No consolidation. There is a vascular stent along the left upper extremity. Coronary  artery calcifications are seen. Trace pericardial fluid. The heart is nonenlarged. Slightly patulous thoracic esophagus. Abdomen/pelvis: There is physiologic distribution radiotracer along the parenchymal organs, bowel and renal collecting systems. Incidental CT  findings: Diffuse vascular calcifications are identified. Speckled calcifications in the area of the uncinate process of the pancreas. Severe atrophy of the native kidneys. 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 There is some contrast in the collecting system of the transplant kidney in the bladder. Please correlate with prior MRI of the head. Bowel is nondilated. Scattered stool. Few colonic diverticula. SKELETON: No abnormal uptake along the visualized osseous structures. Incidental CT findings: Scattered degenerative changes particularly of the spine and pelvis. IMPRESSION: As seen on CT there is a large cavitary mass centered along the superior segment left lower lobe abutting the hilum with marginal abnormal radiotracer uptake. Focal uptake seen along the mildly enlarged left hilar node as seen on prior CT scan could be a regional metastasis. No additional areas of abnormal radiotracer uptake to suggest more distant spread of disease. Left thyroid cystic lesion is not hypermetabolic. Follow up ultrasound could be performed when appropriate. 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 Electronically Signed   By: Adrianna Horde M.D.   On: 09/05/2023 10:39   MR BRAIN W WO CONTRAST Result Date: 09/04/2023 CLINICAL DATA:  Lung cancer staging EXAM: MRI HEAD WITHOUT AND WITH CONTRAST TECHNIQUE: Multiplanar, multiecho pulse sequences of the brain and surrounding structures were obtained without and with intravenous contrast. CONTRAST:  7mL GADAVIST  GADOBUTROL  1 MMOL/ML IV SOLN COMPARISON:  None Available. FINDINGS: Brain: Small foci of diffusion  restriction at the superior right frontal cortex and in the left centrum semiovale to subcortical frontal white matter. These are somewhat weakly restricted but no enhancement typical of subacute infarcts. No abnormal enhancement throughout the brain to suggest metastatic disease. No hemorrhage, hydrocephalus, or collection. Vascular: Major flow voids and vascular enhancements are preserved Skull and upper cervical spine: Normal marrow signal Sinuses/Orbits: Negative Other: These results will be called to the ordering clinician or representative by the Radiologist Assistant, and communication documented in the PACS or [employer]. 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 No abnormal enhancement to suggest metastatic disease. Electronically Signed   By: Ronnette Coke M.D.   On: 09/04/2023 10:03   DG Chest Port 1 View Result Date: 08/18/2023 CLINICAL DATA:  Status post bronchoscopy with biopsy. EXAM: PORTABLE CHEST 1 VIEW COMPARISON:  08/08/2023 FINDINGS: Stable cardiomediastinal contours. Aortic atherosclerotic calcifications. The left perihilar lung mass is again noted measuring approximately 7.5 cm. No pneumothorax identified status post bronchoscopy and biopsy. Right lung is clear. IMPRESSION: 1. No pneumothorax status post bronchoscopy and biopsy. 2. Left perihilar lung mass. Electronically Signed   By: Kimberley Penman M.D.   On: 08/18/2023 12:21   DG C-ARM BRONCHOSCOPY Result Date: 08/18/2023 C-ARM BRONCHOSCOPY: Fluoroscopy was utilized by the requesting physician.  No radiographic interpretation.   CT CHEST W CONTRAST Result Date: 08/08/2023 CLINICAL DATA:  LEFT lung mass on chest radiograph EXAM: CT CHEST WITH CONTRAST TECHNIQUE: Multidetector CT imaging of the chest was performed during intravenous contrast administration. RADIATION DOSE REDUCTION: This exam was performed according to the departmental dose-optimization program which  includes automated exposure control, adjustment of the mA and/or kV according to patient size and/or use of iterative reconstruction technique. CONTRAST:  75mL OMNIPAQUE  IOHEXOL  350 MG/ML SOLN COMPARISON:  Radiograph 08/06/2023 FINDINGS: Cardiovascular: No significant vascular findings. Normal heart size. No pericardial effusion. Mediastinum/Nodes: No enlarged mediastinal or supraclavicular nodes. Homogeneous low-density lesion in the LEFT lobe of the thyroid gland measures 1.8  cm. No axillary adenopathy. Mildly enlarged LEFT hilar node measures 12 mm image 61. Lungs/Pleura: Large round mass in the LEFT lower lobe surrounds and obstructs the LEFT lower lobe bronchus. Mass measures 6.8 by 5.9 cm (image 61/3) and is solid. Centrilobular emphysema the upper lobes. Upper Abdomen: Adrenal glands are normal. No hepatic metastasis identified. There is mild extrahepatic biliary duct dilatation. Patient status post cholecystectomy. Musculoskeletal: No aggressive osseous lesion. IMPRESSION: 1. 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 Mass surrounds and obstructs the LEFT lower lobe bronchus. 2. Mildly enlarged LEFT hilar node. 3. No evidence of metastatic adenopathy in the mediastinum. 4. No evidence of metastatic disease in the upper abdomen. 5. Benign appearing low-density lesion in the LEFT lobe of the thyroid gland. No specific follow-up recommended Electronically Signed   By: Deboraha Fallow M.D.   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  We discussed the natural history of *** and general treatment, highlighting the role of radiotherapy in the management.  We discussed the available radiation techniques, and focused on the details of logistics and delivery.  We reviewed  the anticipated acute and late sequelae associated with radiation in this setting.  The patient was encouraged to ask questions that I answered to the best of my ability. *** A patient consent form was discussed and signed.  We retained a copy for our records.  The patient would like to proceed with radiation and will be scheduled for CT simulation.  PLAN: ***    *** minutes of total time was spent for this patient encounter, including preparation, face-to-face counseling with the patient and coordination of care, physical exam, and documentation of the encounter.   ------------------------------------------------  Noralee Beam, PhD, MD  This document serves as a record of services personally performed by Retta Caster, MD. It was created on his behalf by Aleta Anda, a trained medical scribe. The creation of this record is based on the scribe's personal observations and the provider's statements to them. This document has been checked and approved by the attending provider.

## 2023-09-08 ENCOUNTER — Ambulatory Visit
Admission: RE | Admit: 2023-09-08 | Discharge: 2023-09-08 | Disposition: A | Discharge status: Home / Self Care | Source: Ambulatory Visit | Attending: Radiation Oncology | Admitting: Radiation Oncology

## 2023-09-08 ENCOUNTER — Other Ambulatory Visit: Payer: Self-pay | Admitting: Medical Oncology

## 2023-09-08 VITALS — BP 153/80 | HR 85 | Temp 96.8°F | Resp 20 | Ht 61.5 in | Wt 167.6 lb

## 2023-09-08 DIAGNOSIS — I509 Heart failure, unspecified: Secondary | ICD-10-CM | POA: Diagnosis not present

## 2023-09-08 DIAGNOSIS — I130 Hypertensive heart and chronic kidney disease with heart failure and stage 1 through stage 4 chronic kidney disease, or unspecified chronic kidney disease: Secondary | ICD-10-CM | POA: Diagnosis not present

## 2023-09-08 DIAGNOSIS — Z79899 Other long term (current) drug therapy: Secondary | ICD-10-CM | POA: Diagnosis not present

## 2023-09-08 DIAGNOSIS — J449 Chronic obstructive pulmonary disease, unspecified: Secondary | ICD-10-CM | POA: Diagnosis not present

## 2023-09-08 DIAGNOSIS — I10 Essential (primary) hypertension: Secondary | ICD-10-CM | POA: Diagnosis not present

## 2023-09-08 DIAGNOSIS — C3432 Malignant neoplasm of lower lobe, left bronchus or lung: Secondary | ICD-10-CM | POA: Insufficient documentation

## 2023-09-08 DIAGNOSIS — Z79621 Long term (current) use of calcineurin inhibitor: Secondary | ICD-10-CM | POA: Diagnosis not present

## 2023-09-08 DIAGNOSIS — R590 Localized enlarged lymph nodes: Secondary | ICD-10-CM | POA: Diagnosis not present

## 2023-09-08 DIAGNOSIS — I2510 Atherosclerotic heart disease of native coronary artery without angina pectoris: Secondary | ICD-10-CM | POA: Diagnosis not present

## 2023-09-08 DIAGNOSIS — Z794 Long term (current) use of insulin: Secondary | ICD-10-CM | POA: Insufficient documentation

## 2023-09-08 DIAGNOSIS — Z7982 Long term (current) use of aspirin: Secondary | ICD-10-CM | POA: Insufficient documentation

## 2023-09-08 DIAGNOSIS — Z7984 Long term (current) use of oral hypoglycemic drugs: Secondary | ICD-10-CM | POA: Insufficient documentation

## 2023-09-08 DIAGNOSIS — Z940 Kidney transplant status: Secondary | ICD-10-CM | POA: Insufficient documentation

## 2023-09-08 DIAGNOSIS — Z87891 Personal history of nicotine dependence: Secondary | ICD-10-CM | POA: Insufficient documentation

## 2023-09-08 DIAGNOSIS — E119 Type 2 diabetes mellitus without complications: Secondary | ICD-10-CM | POA: Insufficient documentation

## 2023-09-08 DIAGNOSIS — R918 Other nonspecific abnormal finding of lung field: Secondary | ICD-10-CM

## 2023-09-08 DIAGNOSIS — Z809 Family history of malignant neoplasm, unspecified: Secondary | ICD-10-CM | POA: Diagnosis not present

## 2023-09-08 DIAGNOSIS — E1122 Type 2 diabetes mellitus with diabetic chronic kidney disease: Secondary | ICD-10-CM | POA: Diagnosis not present

## 2023-09-08 DIAGNOSIS — Z7952 Long term (current) use of systemic steroids: Secondary | ICD-10-CM | POA: Insufficient documentation

## 2023-09-09 ENCOUNTER — Inpatient Hospital Stay (HOSPITAL_BASED_OUTPATIENT_CLINIC_OR_DEPARTMENT_OTHER): Admitting: Internal Medicine

## 2023-09-09 ENCOUNTER — Inpatient Hospital Stay: Attending: Internal Medicine

## 2023-09-09 VITALS — BP 146/63 | HR 84 | Temp 98.2°F | Resp 16 | Ht 61.5 in | Wt 167.8 lb

## 2023-09-09 DIAGNOSIS — Z79899 Other long term (current) drug therapy: Secondary | ICD-10-CM | POA: Insufficient documentation

## 2023-09-09 DIAGNOSIS — C3432 Malignant neoplasm of lower lobe, left bronchus or lung: Secondary | ICD-10-CM | POA: Insufficient documentation

## 2023-09-09 DIAGNOSIS — Z87891 Personal history of nicotine dependence: Secondary | ICD-10-CM | POA: Diagnosis not present

## 2023-09-09 DIAGNOSIS — R918 Other nonspecific abnormal finding of lung field: Secondary | ICD-10-CM

## 2023-09-09 LAB — CBC WITH DIFFERENTIAL (CANCER CENTER ONLY)
Abs Immature Granulocytes: 0.02 10*3/uL (ref 0.00–0.07)
Basophils Absolute: 0 10*3/uL (ref 0.0–0.1)
Basophils Relative: 0 %
Eosinophils Absolute: 0 10*3/uL (ref 0.0–0.5)
Eosinophils Relative: 0 %
HCT: 45.5 % (ref 36.0–46.0)
Hemoglobin: 14.9 g/dL (ref 12.0–15.0)
Immature Granulocytes: 0 %
Lymphocytes Relative: 15 %
Lymphs Abs: 1.3 10*3/uL (ref 0.7–4.0)
MCH: 28.7 pg (ref 26.0–34.0)
MCHC: 32.7 g/dL (ref 30.0–36.0)
MCV: 87.7 fL (ref 80.0–100.0)
Monocytes Absolute: 0.6 10*3/uL (ref 0.1–1.0)
Monocytes Relative: 7 %
Neutro Abs: 6.6 10*3/uL (ref 1.7–7.7)
Neutrophils Relative %: 78 %
Platelet Count: 260 10*3/uL (ref 150–400)
RBC: 5.19 MIL/uL — ABNORMAL HIGH (ref 3.87–5.11)
RDW: 14.6 % (ref 11.5–15.5)
WBC Count: 8.5 10*3/uL (ref 4.0–10.5)
nRBC: 0 % (ref 0.0–0.2)

## 2023-09-09 LAB — CMP (CANCER CENTER ONLY)
ALT: 11 U/L (ref 0–44)
AST: 15 U/L (ref 15–41)
Albumin: 4.5 g/dL (ref 3.5–5.0)
Alkaline Phosphatase: 99 U/L (ref 38–126)
Anion gap: 7 (ref 5–15)
BUN: 12 mg/dL (ref 8–23)
CO2: 30 mmol/L (ref 22–32)
Calcium: 9.8 mg/dL (ref 8.9–10.3)
Chloride: 102 mmol/L (ref 98–111)
Creatinine: 0.83 mg/dL (ref 0.44–1.00)
GFR, Estimated: >60 mL/min (ref >60)
Glucose, Bld: 135 mg/dL — ABNORMAL HIGH (ref 70–99)
Potassium: 5.2 mmol/L — ABNORMAL HIGH (ref 3.5–5.1)
Sodium: 139 mmol/L (ref 135–145)
Total Bilirubin: 0.6 mg/dL (ref 0.0–1.2)
Total Protein: 7.5 g/dL (ref 6.5–8.1)

## 2023-09-09 MED ORDER — PROCHLORPERAZINE MALEATE 10 MG PO TABS: 10.0000 mg | ORAL_TABLET | Freq: Four times a day (QID) | ORAL | 1 refills | Status: DC | PRN

## 2023-09-09 MED ORDER — ONDANSETRON HCL 8 MG PO TABS: 8.0000 mg | ORAL_TABLET | Freq: Three times a day (TID) | ORAL | 1 refills | Status: DC | PRN

## 2023-09-09 NOTE — Progress Notes (Signed)
 Pamela [name] Telephone:([phone]) [phone]   Fax:(336) [phone]  CONSULT NOTE  REFERRING PHYSICIAN: Racheal Buddle  REASON FOR CONSULTATION:  73 years old white female recently diagnosed with lung cancer  HPI Pamela [name] is a 73 y.o. female.   Discussed the use of AI scribe software for clinical note transcription with the patient, who gave verbal consent to proceed.  History of Present Illness   Pamela [name] is a 73 year old female with lung cancer who presents for oncology consultation. She was referred by Dr. Gilles Lacks after a lung cancer diagnosis.  Symptoms began on August 04, 2023, with congestion and hemoptysis. A scan on August 08, 2023, showed a large solid mass in the left lower lobe and a suspicious mass in the left hilar lymph node, measuring 6.8 cm. A bronchoscopy and biopsy on August 18, 2023, confirmed non-small cell lung cancer, specifically squamous cell carcinoma, with a small focus of small cell carcinoma. A mediastinoscopy on August 25, 2023, showed the large mass obstructing the hilum and involvement of the left hilar lymph nodes.  She experiences shortness of breath, which she attributes to her COPD. No current hemoptysis, weight loss, nausea, vomiting, diarrhea, or headaches. Initially, she had hemoptysis two to three times daily, but this has resolved. She feels nervous and anxious about her condition.  Her past medical history includes anemia, anxiety, congestive heart failure, chronic kidney disease, COPD, hypertension, diabetes, and heart disease. She had a kidney biopsy in the past that led to complications, including a strep infection and kidney failure, but she has since improved. She had a stent placed in 2001. No history of stroke or hyperlipidemia.  Family history is significant for heart disease in both parents and lung cancer (mesothelioma) in her father. She also has two sisters with breast cancer.  Social history reveals she worked in  Teacher, adult education for 32 years, smoked for 20 years but quit in 1994, and does not consume alcohol or use drugs. She has been married for 56 years, has two children, and three grandchildren.        Past Medical History:  Diagnosis Date   Anemia    Anxiety    CHF (congestive heart failure) (HCC)    Chronic kidney disease    COPD (chronic obstructive pulmonary disease) (HCC)    Coronary artery disease    Diabetes mellitus without complication (HCC) 10-29-12   Headache    Hypertension    PONV (postoperative nausea and vomiting)    Shortness of breath dyspnea     Past Surgical History:  Procedure Laterality Date   ABDOMINAL HYSTERECTOMY     ANAL FISSURE REPAIR     APPENDECTOMY     '73- open with gallbladder   AV FISTULA PLACEMENT Left 01/31/2015   Procedure: ARTERIOVENOUS FISTULA CREATION-LEFT BRACHIO-CEPHALIC ;  Surgeon: Arvil Lauber, MD;  Location: [name];  Service: Vascular;  Laterality: Left;   AV FISTULA PLACEMENT Left 04/04/2015   Procedure: INSERTION OF ARTERIOVENOUS (AV) GORE-TEX GRAFT ARM;  Surgeon: Richrd Char, MD;  Location: [facility] OR;  Service: Vascular;  Laterality: Left;   BREAST EXCISIONAL BIOPSY Left    BREAST SURGERY     cyst removed   BRONCHIAL BIOPSY  08/18/2023   Procedure: BRONCHOSCOPY, WITH BIOPSY;  Surgeon: Denson Flake, MD;  Location: [facility] ENDOSCOPY;  Service: Pulmonary;;   BRONCHIAL BRUSHINGS  08/18/2023   Procedure: BRONCHOSCOPY, WITH BRUSH BIOPSY;  Surgeon: Denson Flake, MD;  Location: [facility] ENDOSCOPY;  Service: Pulmonary;;   CARDIAC CATHETERIZATION     1 coronary stent placed   CHOLECYSTECTOMY     '74-open   COLONOSCOPY WITH PROPOFOL  N/A 11/17/2012   Procedure: COLONOSCOPY WITH PROPOFOL ;  Surgeon: Garrett Kallman, MD;  Location: [facility] ENDOSCOPY;  Service: Endoscopy;  Laterality: N/A;   CORONARY STENT PLACEMENT     ELBOW SURGERY Right    tendon surgery   ESOPHAGOGASTRODUODENOSCOPY (EGD) WITH PROPOFOL  N/A 11/17/2012   Procedure: ESOPHAGOGASTRODUODENOSCOPY (EGD)  WITH PROPOFOL ;  Surgeon: Garrett Kallman, MD;  Location: [facility] ENDOSCOPY;  Service: Endoscopy;  Laterality: N/A;   GANGLION CYST EXCISION Bilateral 10/29/2012   wrist   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   LEFT HEART CATHETERIZATION WITH CORONARY ANGIOGRAM N/A 04/14/2014   Procedure: LEFT HEART CATHETERIZATION WITH CORONARY ANGIOGRAM;  Surgeon: Mickiel Albany, MD;  Location: [name] CATH LAB;  Service: Cardiovascular;  Laterality: N/A;   PERIPHERAL VASCULAR CATHETERIZATION N/A 03/09/2015   Procedure: Fistulagram;  Surgeon: Arvil Lauber, MD;  Location: [name] INVASIVE CV LAB;  Service: Cardiovascular;  Laterality: N/A;   TEE WITHOUT CARDIOVERSION N/A 01/20/2015   Procedure: TRANSESOPHAGEAL ECHOCARDIOGRAM (TEE);  Surgeon: Elmyra Haggard, MD;  Location: [facility] ENDOSCOPY;  Service: Cardiovascular;  Laterality: N/A;   TUBAL LIGATION     VIDEO BRONCHOSCOPY  08/18/2023   Procedure: BRONCHOSCOPY, WITH FLUOROSCOPY;  Surgeon: Denson Flake, MD;  Location: [facility] ENDOSCOPY;  Service: Pulmonary;;    Family History  Problem Relation Age of Onset   Diabetes Father    Cancer Father    Hypertension Father    Heart attack Mother    Hypertension Mother    Diabetes Sister    Hypertension Sister    Cancer Sister    Hypertension Sister    Breast cancer Sister     Social History Social History   Tobacco Use   Smoking status: Former    Current packs/day: 0.00    Types: Cigarettes    Quit date: 10/29/1992    Years since quitting: 30.8   Smokeless tobacco: Never  Vaping Use   Vaping status: Never Used  Substance Use Topics   Alcohol use: No    Alcohol/week: 0.0 standard drinks of alcohol   Drug use: No    No Known Allergies  Current Outpatient Medications  Medication Sig Dispense Refill   Accu-Chek Softclix Lancets lancets DX: E11.21 as directed     acetaminophen  (TYLENOL ) 650 MG CR tablet Take by mouth.     albuterol  (VENTOLIN  HFA) 108 (90 Base) MCG/ACT inhaler 1 puff as needed.     amLODipine  (NORVASC ) 10 MG  tablet Take 10 mg by mouth daily.     aspirin  EC 81 MG tablet Take 81 mg by mouth daily.     atorvastatin  (LIPITOR) 10 MG tablet Take 1 tablet (10 mg total) by mouth daily. 90 tablet 3   Blood Glucose Calibration (ACCU-CHEK AVIVA) SOLN DX: E11.21 as directed     carvedilol  (COREG ) 12.5 MG tablet Take 1 tablet (12.5 mg total) by mouth 2 (two) times daily. 180 tablet 3   COVID-19 mRNA bivalent vaccine, Pfizer, (PFIZER COVID-19 VAC BIVALENT) injection Inject into the muscle. (Patient not taking: Reported on 09/08/2023) 0.3 mL 0   COVID-19 mRNA Vac-TriS, Pfizer, (PFIZER-BIONT COVID-19 VAC-TRIS) SUSP injection Inject into the muscle. (Patient not taking: Reported on 09/08/2023) 0.3 mL 0   glimepiride  (AMARYL ) 2 MG tablet Take 2 mg by mouth daily.     glucose blood (ACCU-CHEK AVIVA PLUS) test strip use as directed  to check  blood sugar 4 times daily     influenza vaccine adjuvanted (FLUAD) 0.5 ML injection Inject into the muscle. 0.5 mL 0   insulin  glargine (LANTUS ) 100 UNIT/ML injection Inject 10 Units into the skin at bedtime.     insulin  glargine (LANTUS ) 100 UNIT/ML injection Inject 30 Units into the skin every morning.     insulin  lispro (HUMALOG) 100 UNIT/ML KiwkPen Inject into the skin. Take 4 units in the skin in the a.m Take 6 units in the skin at lunch Take 8 units in the skin at supper     latanoprost  (XALATAN ) 0.005 % ophthalmic solution Place 1 drop into both eyes at bedtime.     mycophenolate (CELLCEPT) 250 MG capsule Take 750 mg by mouth.      nitroGLYCERIN  (NITROSTAT ) 0.4 MG SL tablet Place 0.4 mg under the tongue every 5 (five) minutes as needed for chest pain.     predniSONE  (DELTASONE ) 5 MG tablet Take 5 mg by mouth daily with breakfast.      Tacrolimus  1 MG TB24 Take 1 mg by mouth daily.     tacrolimus  ER (ENVARSUS  XR) 1 MG TB24 Take 1 mg by mouth daily.     umeclidinium-vilanterol (ANORO ELLIPTA) 62.5-25 MCG/INH AEPB Inhale 1 puff into the lungs daily.      No current  facility-administered medications for this visit.    Review of Systems  Constitutional: positive for fatigue Eyes: negative Ears, nose, mouth, throat, and face: negative Respiratory: positive for dyspnea on exertion Cardiovascular: negative Gastrointestinal: negative Genitourinary:negative Integument/breast: negative Hematologic/lymphatic: negative Musculoskeletal:negative Neurological: negative Behavioral/Psych: negative Endocrine: negative Allergic/Immunologic: negative  Physical Exam  QVZ:DGLOV, healthy, no distress, well nourished, and well developed SKIN: skin color, texture, turgor are normal, no rashes or significant lesions HEAD: Normocephalic, No masses, lesions, tenderness or abnormalities EYES: normal, PERRLA, Conjunctiva are pink and non-injected EARS: External ears normal, Canals clear OROPHARYNX:no exudate, no erythema, and lips, buccal mucosa, and tongue normal  NECK: supple, no adenopathy, no JVD LYMPH:  no palpable lymphadenopathy, no hepatosplenomegaly BREAST:not examined LUNGS: clear to auscultation , and palpation HEART: regular rate & rhythm, no murmurs, and no gallops ABDOMEN:abdomen soft, non-tender, normal bowel sounds, and no masses or organomegaly BACK: Back symmetric, no curvature., No CVA tenderness EXTREMITIES:no joint deformities, effusion, or inflammation, no edema  NEURO: alert & oriented x 3 with fluent speech, no focal motor/sensory deficits  PERFORMANCE STATUS: ECOG 1  LABORATORY DATA: Lab Results  Component Value Date   WBC 8.5 09/09/2023   HGB 14.9 09/09/2023   HCT 45.5 09/09/2023   MCV 87.7 09/09/2023   PLT 260 09/09/2023      Chemistry      Component Value Date/Time   NA 139 09/09/2023 1124   K 5.2 (H) 09/09/2023 1124   CL 102 09/09/2023 1124   CO2 30 09/09/2023 1124   BUN 12 09/09/2023 1124   CREATININE 0.83 09/09/2023 1124      Component Value Date/Time   CALCIUM  9.8 09/09/2023 1124   CALCIUM  9.4 12/06/2014 0821    ALKPHOS 99 09/09/2023 1124   AST 15 09/09/2023 1124   ALT 11 09/09/2023 1124   BILITOT 0.6 09/09/2023 1124       RADIOGRAPHIC STUDIES: NM PET Image Initial (PI) Skull Base To Thigh Result Date: 09/05/2023 CLINICAL DATA:  Initial treatment strategy for lung nodule. EXAM: NUCLEAR MEDICINE PET SKULL BASE TO THIGH TECHNIQUE: 8.40 mCi F-18 FDG was injected intravenously. Full-ring PET imaging was performed from the skull  base to thigh after the radiotracer. CT data was obtained and used for attenuation correction and anatomic localization. Fasting blood glucose: 198 mg/dl COMPARISON:  Chest CT with contrast 08/08/2023. FINDINGS: Mediastinal blood pool activity: SUV max 3.5 Liver activity: SUV max 4.0. Neck: Mild physiological pharyngeal uptake. Near symmetric uptake of the visualized intracranial compartment. No specific abnormal uptake elsewhere in the neck including along lymph node change of the submandibular, posterior triangle or internal jugular regions. Incidental CT findings: Visualized portions of the paranasal sinuses and mastoid air cells are clear. The parotid glands, submandibular glands are unremarkable. There is a cystic focus in the left thyroid lobe which again does not show any abnormal uptake but measures up to 18 mm. Confirmatory ultrasound could be considered when clinically appropriate. Scattered vascular calcifications are seen. Chest: Centered in the superior segment of the left lower lobe once again is a large cavitary mass as seen on CT scan. On the prior CT this measured 6.8 x 5.9 cm. Today this has maximum SUV value 11.2. The uptake is along the periphery of lesion most confluent along the inferior aspect as well as medial superiorly. Lesion once again extends from the pleura to the hilum. There is an occluded left lower lobe bronchus identified once again seen such as series CT image 67. No additional areas of abnormal uptake identified in the lungs. There is slight asymmetric  uptake seen along the adjacent left lower lobe lung hilum posteriorly the small focus of uptake maximum SUV of 3.7 on image 66. This corresponds to the hilar node described on the CT scan at 12 mm. No other areas of abnormal uptake above blood pool in the axillary region, hilum or mediastinum. Incidental CT findings: Breathing motion. Emphysematous lung changes are identified. No pleural effusion or pneumothorax. No consolidation. There is a vascular stent along the left upper extremity. Coronary artery calcifications are seen. Trace pericardial fluid. The heart is nonenlarged. Slightly patulous thoracic esophagus. Abdomen/pelvis: There is physiologic distribution radiotracer along the parenchymal organs, bowel and renal collecting systems. Incidental CT findings: Diffuse vascular calcifications are identified. Speckled calcifications in the area of the uncinate process of the pancreas. Severe atrophy of the native kidneys. 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 There is some contrast in the collecting system of the transplant kidney in the bladder. Please correlate with prior MRI of the head. Bowel is nondilated. Scattered stool. Few colonic diverticula. SKELETON: No abnormal uptake along the visualized osseous structures. Incidental CT findings: Scattered degenerative changes particularly of the spine and pelvis. IMPRESSION: As seen on CT there is a large cavitary mass centered along the superior segment left lower lobe abutting the hilum with marginal abnormal radiotracer uptake. Focal uptake seen along the mildly enlarged left hilar node as seen on prior CT scan could be a regional metastasis. No additional areas of abnormal radiotracer uptake to suggest more distant spread of disease. Left thyroid cystic lesion is not hypermetabolic. Follow up ultrasound could be performed when appropriate. 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 Electronically Signed   By: Adrianna Horde M.D.   On: 09/05/2023 10:39   MR BRAIN W WO CONTRAST Result Date: 09/04/2023 CLINICAL DATA:  Lung cancer staging EXAM: MRI HEAD WITHOUT AND WITH CONTRAST TECHNIQUE: Multiplanar, multiecho pulse sequences of the brain and surrounding structures were obtained without and with intravenous contrast. CONTRAST:  7mL GADAVIST  GADOBUTROL  1 MMOL/ML IV SOLN COMPARISON:  None Available.  FINDINGS: Brain: Small foci of diffusion restriction at the superior right frontal cortex and in the left centrum semiovale to subcortical frontal white matter. These are somewhat weakly restricted but no enhancement typical of subacute infarcts. No abnormal enhancement throughout the brain to suggest metastatic disease. No hemorrhage, hydrocephalus, or collection. Vascular: Major flow voids and vascular enhancements are preserved Skull and upper cervical spine: Normal marrow signal Sinuses/Orbits: Negative Other: These results will be called to the ordering clinician or representative by the Radiologist Assistant, and communication documented in the PACS or [employer]. 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 No abnormal enhancement to suggest metastatic disease. Electronically Signed   By: Ronnette Coke M.D.   On: 09/04/2023 10:03   DG Chest Port 1 View Result Date: 08/18/2023 CLINICAL DATA:  Status post bronchoscopy with biopsy. EXAM: PORTABLE CHEST 1 VIEW COMPARISON:  08/08/2023 FINDINGS: Stable cardiomediastinal contours. Aortic atherosclerotic calcifications. The left perihilar lung mass is again noted measuring approximately 7.5 cm. No pneumothorax identified status post bronchoscopy and biopsy. Right lung is clear. IMPRESSION: 1. No pneumothorax status post bronchoscopy and biopsy. 2. Left perihilar lung mass. Electronically Signed   By: Kimberley Penman M.D.   On: 08/18/2023 12:21   DG  C-ARM BRONCHOSCOPY Result Date: 08/18/2023 C-ARM BRONCHOSCOPY: Fluoroscopy was utilized by the requesting physician.  No radiographic interpretation.    ASSESSMENT: This is a very pleasant 73 years old white female recently diagnosed with stage IIIa (T3, N1, M0) non-small cell lung cancer, squamous cell carcinoma with a focal area of small cell presented with large left lower lobe lung mass in addition to left hilar lymphadenopathy diagnosed in April 2025.   PLAN: I had a lengthy discussion with the patient and her husband today about her current disease stage, prognosis and treatment options. I personally and independently reviewed the scan images and discussed the result with the patient and her husband today.    Stage 3A (T3, N1, M0) non-small cell lung cancer, squamous cell carcinoma. Stage 3A non-small cell lung cancer with a large mass in the left lower lobe and involvement of the left hilar lymph nodes. The tumor is primarily squamous cell carcinoma with a small focus of small cell carcinoma. Surgery is not an option due to the tumor's location, size, and her COPD. The treatment plan involves concurrent chemotherapy and radiation therapy. The chemotherapy regimen includes carboplatin and paclitaxel, administered weekly in conjunction with daily radiation therapy for six and a half weeks. The goal is to control and potentially cure the cancer. Post-treatment, a scan will be performed to assess response, and if favorable, transition to immunotherapy may be considered, although this is complicated by her history of kidney transplant. The chemotherapy and radiation are not expected to adversely affect kidney transplant function, but immunotherapy may pose a risk of transplant rejection. - Administer carboplatin and paclitaxel weekly with concurrent radiation therapy for six and a half weeks. - Schedule follow-up scan three weeks post-treatment to assess response. - Monitor blood counts, kidney  function, liver function, and blood sugar during treatment. - Provide nausea medication prescription for home use.  Chronic obstructive pulmonary disease (COPD) COPD is a significant factor in the decision against surgical intervention for lung cancer due to concerns about surgical tolerance and recovery.  Chronic kidney disease Chronic kidney disease is a consideration in the treatment plan, particularly regarding the potential impact of immunotherapy on the kidney transplant. Chemotherapy and radiation are not expected to  adversely affect kidney function. - Monitor kidney function closely during treatment.  Congestive heart failure Congestive heart failure noted, but no specific issues discussed in relation to current treatment plan.  Hypertension Hypertension is part of her medical history, but no specific issues discussed in relation to current treatment plan.  Diabetes mellitus Diabetes mellitus is part of her medical history, but no specific issues discussed in relation to current treatment plan.  Anxiety Anxiety noted, particularly related to the cancer diagnosis and treatment plan. She expressed nervousness during the consultation.   The patient was advised to call immediately if she has any other concerning symptoms in the interval. The patient voices understanding of current disease status and treatment options and is in agreement with the current care plan.  All questions were answered. The patient knows to call the clinic with any problems, questions or concerns. We can certainly see the patient much sooner if necessary.  Thank you so much for allowing me to participate in the care of Pamela [name]. I will continue to follow up the patient with you and assist in her care.  The total time spent in the appointment was 90 minutes.  Disclaimer: This note was dictated with voice recognition software. Similar sounding words can inadvertently be transcribed and may not be  corrected upon review.   Aurelio Blower September 09, 2023, 12:09 PM

## 2023-09-09 NOTE — Progress Notes (Signed)
 START ON PATHWAY REGIMEN - Non-Small Cell Lung     A cycle is every 7 days, concurrent with RT:     Paclitaxel      Carboplatin   **Always confirm dose/schedule in your pharmacy ordering system**  Patient Characteristics: Preoperative or Nonsurgical Candidate (Clinical Staging), Stage IIB (N2a only) or Stage III - Nonsurgical Candidate, PS = 0,1 Therapeutic Status: Preoperative or Nonsurgical Candidate (Clinical Staging) AJCC T Category: cT3 AJCC N Category: cN1 AJCC M Category: cM0 AJCC 9 Stage Grouping: IIIA Check here if patient was staged using an edition other than AJCC Staging 9th Edition: false ECOG Performance Status: 1 Intent of Therapy: Curative Intent, Discussed with Patient

## 2023-09-09 NOTE — Telephone Encounter (Signed)
 ATC x1 phone rang and sound like someone picked up but could not hear nothing on the other end . Will try to contact patient again.

## 2023-09-10 ENCOUNTER — Encounter (HOSPITAL_COMMUNITY): Payer: Self-pay

## 2023-09-10 ENCOUNTER — Other Ambulatory Visit: Payer: Self-pay

## 2023-09-10 ENCOUNTER — Inpatient Hospital Stay

## 2023-09-10 ENCOUNTER — Encounter: Payer: Self-pay | Admitting: Internal Medicine

## 2023-09-10 DIAGNOSIS — Z87891 Personal history of nicotine dependence: Secondary | ICD-10-CM | POA: Diagnosis not present

## 2023-09-10 DIAGNOSIS — C3432 Malignant neoplasm of lower lobe, left bronchus or lung: Secondary | ICD-10-CM | POA: Diagnosis not present

## 2023-09-10 NOTE — Progress Notes (Signed)
 I met the pt today for the first time face to face at her consult with Dr. Marguerita Shih. Pt was accompanied by her husband, Athena Bland. Plan for the pt is to have 6.5 weeks of concurrent chemoradiation. Pt saw Dr Eloise Hake on 4/28. Goal start date is 5/5 or 5/6. I have sent an IB message to Rad Onc to schedule pt SIM. Pt understands the plan. I provided my contact information to the pt and encouraged her to call me with any concerns. I escorted the pt to scheduling to arrange a chemo education class. No additional questions voiced by the pt at this time.

## 2023-09-10 NOTE — Telephone Encounter (Signed)
 Called and spoke with patient, she states she has already had the PET, MRI, seen medical oncology and radiation oncology.  It has been recommended that she have chemo and radiation because the area is so large to try to shrink it down.  She said she was supposed to have had a PFT on 4/23, but that did not happen.  I attempted to schedule a PFT with her, however, she declined.  She wanted to know if she needed to be seen in the office by Dara Ear NP since she had a;ready been seen by all the other providers.  I advised her I would get a message to Isa Manuel and once we hear back, we will call her with Sarah's response.  She verbalized understanding.  Isa Manuel, Please advise regarding need for follow up appointment on Friday 5/2. Thank you.

## 2023-09-11 ENCOUNTER — Other Ambulatory Visit: Payer: Self-pay

## 2023-09-11 ENCOUNTER — Ambulatory Visit
Admission: RE | Admit: 2023-09-11 | Discharge: 2023-09-11 | Disposition: A | Discharge status: Home / Self Care | Source: Ambulatory Visit | Attending: Radiation Oncology | Admitting: Radiation Oncology

## 2023-09-11 ENCOUNTER — Inpatient Hospital Stay: Attending: Internal Medicine

## 2023-09-11 DIAGNOSIS — C3432 Malignant neoplasm of lower lobe, left bronchus or lung: Secondary | ICD-10-CM | POA: Insufficient documentation

## 2023-09-11 DIAGNOSIS — Z79899 Other long term (current) drug therapy: Secondary | ICD-10-CM | POA: Insufficient documentation

## 2023-09-11 DIAGNOSIS — Z510 Encounter for antineoplastic radiation therapy: Secondary | ICD-10-CM | POA: Diagnosis not present

## 2023-09-11 DIAGNOSIS — Z87891 Personal history of nicotine dependence: Secondary | ICD-10-CM | POA: Insufficient documentation

## 2023-09-11 DIAGNOSIS — R799 Abnormal finding of blood chemistry, unspecified: Secondary | ICD-10-CM | POA: Diagnosis not present

## 2023-09-11 DIAGNOSIS — Z5111 Encounter for antineoplastic chemotherapy: Secondary | ICD-10-CM | POA: Diagnosis not present

## 2023-09-12 ENCOUNTER — Ambulatory Visit: Admitting: Acute Care

## 2023-09-12 ENCOUNTER — Telehealth: Payer: Self-pay | Admitting: Internal Medicine

## 2023-09-12 ENCOUNTER — Telehealth: Payer: Self-pay | Admitting: Licensed Clinical Social Worker

## 2023-09-12 NOTE — Telephone Encounter (Signed)
 Attempted to reach pt to assess. Reached pt's spouse who requested CSW call pt's cell. VM left on pt's cell. Will assess in infusion if a return call is not received.

## 2023-09-12 NOTE — Telephone Encounter (Signed)
 Spoke to the patient about first infusion scheduled. The patient said they will get a calendar when they come.

## 2023-09-15 ENCOUNTER — Inpatient Hospital Stay

## 2023-09-15 VITALS — BP 137/64 | HR 80 | Temp 98.2°F | Resp 20 | Wt 167.0 lb

## 2023-09-15 DIAGNOSIS — C3432 Malignant neoplasm of lower lobe, left bronchus or lung: Secondary | ICD-10-CM

## 2023-09-15 DIAGNOSIS — Z5111 Encounter for antineoplastic chemotherapy: Secondary | ICD-10-CM | POA: Diagnosis not present

## 2023-09-15 DIAGNOSIS — Z79899 Other long term (current) drug therapy: Secondary | ICD-10-CM | POA: Diagnosis not present

## 2023-09-15 DIAGNOSIS — R799 Abnormal finding of blood chemistry, unspecified: Secondary | ICD-10-CM | POA: Diagnosis not present

## 2023-09-15 DIAGNOSIS — Z87891 Personal history of nicotine dependence: Secondary | ICD-10-CM | POA: Diagnosis not present

## 2023-09-15 DIAGNOSIS — Z510 Encounter for antineoplastic radiation therapy: Secondary | ICD-10-CM | POA: Diagnosis not present

## 2023-09-15 LAB — CMP (CANCER CENTER ONLY)
ALT: 10 U/L (ref 0–44)
AST: 13 U/L — ABNORMAL LOW (ref 15–41)
Albumin: 4.1 g/dL (ref 3.5–5.0)
Alkaline Phosphatase: 98 U/L (ref 38–126)
Anion gap: 7 (ref 5–15)
BUN: 14 mg/dL (ref 8–23)
CO2: 27 mmol/L (ref 22–32)
Calcium: 9.2 mg/dL (ref 8.9–10.3)
Chloride: 102 mmol/L (ref 98–111)
Creatinine: 0.88 mg/dL (ref 0.44–1.00)
GFR, Estimated: >60 mL/min (ref >60)
Glucose, Bld: 108 mg/dL — ABNORMAL HIGH (ref 70–99)
Potassium: 4.3 mmol/L (ref 3.5–5.1)
Sodium: 136 mmol/L (ref 135–145)
Total Bilirubin: 0.5 mg/dL (ref 0.0–1.2)
Total Protein: 6.8 g/dL (ref 6.5–8.1)

## 2023-09-15 LAB — CBC WITH DIFFERENTIAL (CANCER CENTER ONLY)
Abs Immature Granulocytes: 0.03 10*3/uL (ref 0.00–0.07)
Basophils Absolute: 0.1 10*3/uL (ref 0.0–0.1)
Basophils Relative: 1 %
Eosinophils Absolute: 0.2 10*3/uL (ref 0.0–0.5)
Eosinophils Relative: 1 %
HCT: 42.8 % (ref 36.0–46.0)
Hemoglobin: 14 g/dL (ref 12.0–15.0)
Immature Granulocytes: 0 %
Lymphocytes Relative: 10 %
Lymphs Abs: 1.2 10*3/uL (ref 0.7–4.0)
MCH: 28.5 pg (ref 26.0–34.0)
MCHC: 32.7 g/dL (ref 30.0–36.0)
MCV: 87.2 fL (ref 80.0–100.0)
Monocytes Absolute: 1.1 10*3/uL — ABNORMAL HIGH (ref 0.1–1.0)
Monocytes Relative: 9 %
Neutro Abs: 9.9 10*3/uL — ABNORMAL HIGH (ref 1.7–7.7)
Neutrophils Relative %: 79 %
Platelet Count: 259 10*3/uL (ref 150–400)
RBC: 4.91 MIL/uL (ref 3.87–5.11)
RDW: 14.6 % (ref 11.5–15.5)
WBC Count: 12.4 10*3/uL — ABNORMAL HIGH (ref 4.0–10.5)
nRBC: 0 % (ref 0.0–0.2)

## 2023-09-15 MED ORDER — SODIUM CHLORIDE 0.9% FLUSH: 10.0000 mL | INTRAVENOUS | Status: DC | PRN

## 2023-09-15 MED ORDER — CARBOPLATIN CHEMO INJECTION 450 MG/45ML
170.4000 mg | Freq: Once | INTRAVENOUS | Status: AC
  Administered 2023-09-15: 170 mg via INTRAVENOUS
  Filled 2023-09-15: qty 17

## 2023-09-15 MED ORDER — HEPARIN SOD (PORK) LOCK FLUSH 100 UNIT/ML IV SOLN: 500.0000 [IU] | Freq: Once | INTRAVENOUS | Status: DC | PRN

## 2023-09-15 MED ORDER — SODIUM CHLORIDE 0.9 % IV SOLN
45.0000 mg/m2 | Freq: Once | INTRAVENOUS | Status: AC
  Administered 2023-09-15: 84 mg via INTRAVENOUS
  Filled 2023-09-15: qty 14

## 2023-09-15 MED ORDER — DEXAMETHASONE SODIUM PHOSPHATE 10 MG/ML IJ SOLN
10.0000 mg | Freq: Once | INTRAMUSCULAR | Status: AC
  Administered 2023-09-15: 10 mg via INTRAVENOUS
  Filled 2023-09-15: qty 1

## 2023-09-15 MED ORDER — FAMOTIDINE IN NACL 20-0.9 MG/50ML-% IV SOLN
20.0000 mg | Freq: Once | INTRAVENOUS | Status: AC
  Administered 2023-09-15: 20 mg via INTRAVENOUS
  Filled 2023-09-15: qty 50

## 2023-09-15 MED ORDER — SODIUM CHLORIDE 0.9 % IV SOLN: INTRAVENOUS | Status: DC

## 2023-09-15 MED ORDER — DIPHENHYDRAMINE HCL 50 MG/ML IJ SOLN
50.0000 mg | Freq: Once | INTRAMUSCULAR | Status: AC
  Administered 2023-09-15: 50 mg via INTRAVENOUS
  Filled 2023-09-15: qty 1

## 2023-09-15 MED ORDER — PALONOSETRON HCL INJECTION 0.25 MG/5ML
0.2500 mg | Freq: Once | INTRAVENOUS | Status: AC
  Administered 2023-09-15: 0.25 mg via INTRAVENOUS
  Filled 2023-09-15: qty 5

## 2023-09-15 NOTE — Patient Instructions (Signed)
 [facility] - A DEPT OF [facility]  Discharge Instructions: Thank you for choosing [facility] to provide your oncology and hematology care.   If you have a lab appointment with the Cancer Center, please go directly to the Cancer Center and check in at the registration area.   Wear comfortable clothing and clothing appropriate for easy access to any Portacath or PICC line.   We strive to give you quality time with your provider. You may need to reschedule your appointment if you arrive late (15 or more minutes).  Arriving late affects you and other patients whose appointments are after yours.  Also, if you miss three or more appointments without notifying the office, you may be dismissed from the clinic at the provider's discretion.      For prescription refill requests, have your pharmacy contact our office and allow 72 hours for refills to be completed.    Today you received the following chemotherapy and/or immunotherapy agents: CARBOplatin (PARAPLATIN) and PACLitaxel (TAXOL)     To help prevent nausea and vomiting after your treatment, we encourage you to take your nausea medication as directed.  BELOW ARE SYMPTOMS THAT SHOULD BE REPORTED IMMEDIATELY: *FEVER GREATER THAN 100.4 F (38 C) OR HIGHER *CHILLS OR SWEATING *NAUSEA AND VOMITING THAT IS NOT CONTROLLED WITH YOUR NAUSEA MEDICATION *UNUSUAL SHORTNESS OF BREATH *UNUSUAL BRUISING OR BLEEDING *URINARY PROBLEMS (pain or burning when urinating, or frequent urination) *BOWEL PROBLEMS (unusual diarrhea, constipation, pain near the anus) TENDERNESS IN MOUTH AND THROAT WITH OR WITHOUT PRESENCE OF ULCERS (sore throat, sores in mouth, or a toothache) UNUSUAL RASH, SWELLING OR PAIN  UNUSUAL VAGINAL DISCHARGE OR ITCHING   Items with * indicate a potential emergency and should be followed up as soon as possible or go to the Emergency Department if any problems should occur.  Please show the  CHEMOTHERAPY ALERT CARD or IMMUNOTHERAPY ALERT CARD at check-in to the Emergency Department and triage nurse.  Should you have questions after your visit or need to cancel or reschedule your appointment, please contact [facility] - A DEPT OF [facility]  Dept: [phone]  and follow the prompts.  Office hours are 8:00 a.m. to 4:30 p.m. Monday - Friday. Please note that voicemails left after 4:00 p.m. may not be returned until the following business day.  We are closed weekends and major holidays. You have access to a nurse at all times for urgent questions. Please call the main number to the clinic Dept: [phone] and follow the prompts.   For any non-urgent questions, you may also contact your provider using MyChart. We now offer e-Visits for anyone 16 and older to request care online for non-urgent symptoms. For details visit [email or web address].   Also download the MyChart app! Go to the app store, search "MyChart", open the app, select [facility], and log in with your MyChart username and password.  Carboplatin Injection What is this medication? CARBOPLATIN (KAR boe pla tin) treats some types of cancer. It works by slowing down the growth of cancer cells. This medicine may be used for other purposes; ask your health care provider or pharmacist if you have questions. COMMON BRAND NAME(S): Paraplatin What should I tell my care team before I take this medication? They need to know if you have any of these conditions: Blood disorders Hearing problems Kidney disease Recent or ongoing radiation therapy An unusual or allergic reaction to  carboplatin, cisplatin, other medications, foods, dyes, or preservatives Pregnant or trying to get pregnant Breast-feeding How should I use this medication? This medication is injected into a vein. It is given by your care team in a hospital or clinic setting. Talk to your care team about the use of this medication in  children. Special care may be needed. Overdosage: If you think you have taken too much of this medicine contact a poison control center or emergency room at once. NOTE: This medicine is only for you. Do not share this medicine with others. What if I miss a dose? Keep appointments for follow-up doses. It is important not to miss your dose. Call your care team if you are unable to keep an appointment. What may interact with this medication? Medications for seizures Some antibiotics, such as amikacin, gentamicin, neomycin, streptomycin, tobramycin Vaccines This list may not describe all possible interactions. Give your health care provider a list of all the medicines, herbs, non-prescription drugs, or dietary supplements you use. Also tell them if you smoke, drink alcohol, or use illegal drugs. Some items may interact with your medicine. What should I watch for while using this medication? Your condition will be monitored carefully while you are receiving this medication. You may need blood work while taking this medication. This medication may make you feel generally unwell. This is not uncommon, as chemotherapy can affect healthy cells as well as cancer cells. Report any side effects. Continue your course of treatment even though you feel ill unless your care team tells you to stop. In some cases, you may be given additional medications to help with side effects. Follow all directions for their use. This medication may increase your risk of getting an infection. Call your care team for advice if you get a fever, chills, sore throat, or other symptoms of a cold or flu. Do not treat yourself. Try to avoid being around people who are sick. Avoid taking medications that contain aspirin, acetaminophen, ibuprofen, naproxen, or ketoprofen unless instructed by your care team. These medications may hide a fever. Be careful brushing or flossing your teeth or using a toothpick because you may get an infection or  bleed more easily. If you have any dental work done, tell your dentist you are receiving this medication. Talk to your care team if you wish to become pregnant or think you might be pregnant. This medication can cause serious birth defects. Talk to your care team about effective forms of contraception. Do not breast-feed while taking this medication. What side effects may I notice from receiving this medication? Side effects that you should report to your care team as soon as possible: Allergic reactions--skin rash, itching, hives, swelling of the face, lips, tongue, or throat Infection--fever, chills, cough, sore throat, wounds that don't heal, pain or trouble when passing urine, general feeling of discomfort or being unwell Low red blood cell level--unusual weakness or fatigue, dizziness, headache, trouble breathing Pain, tingling, or numbness in the hands or feet, muscle weakness, change in vision, confusion or trouble speaking, loss of balance or coordination, trouble walking, seizures Unusual bruising or bleeding Side effects that usually do not require medical attention (report to your care team if they continue or are bothersome): Hair loss Nausea Unusual weakness or fatigue Vomiting This list may not describe all possible side effects. Call your doctor for medical advice about side effects. You may report side effects to FDA at 1-800-FDA-1088. Where should I keep my medication? This medication is given in  a hospital or clinic. It will not be stored at home. NOTE: This sheet is a summary. It may not cover all possible information. If you have questions about this medicine, talk to your doctor, pharmacist, or health care provider.  2024 Elsevier/Gold Standard (2021-08-21 00:00:00)  Paclitaxel Injection What is this medication? PACLITAXEL (PAK li TAX el) treats some types of cancer. It works by slowing down the growth of cancer cells. This medicine may be used for other purposes; ask  your health care provider or pharmacist if you have questions. COMMON BRAND NAME(S): Onxol, Taxol What should I tell my care team before I take this medication? They need to know if you have any of these conditions: Heart disease Liver disease Low white blood cell levels An unusual or allergic reaction to paclitaxel, other medications, foods, dyes, or preservatives If you or your partner are pregnant or trying to get pregnant Breast-feeding How should I use this medication? This medication is injected into a vein. It is given by your care team in a hospital or clinic setting. Talk to your care team about the use of this medication in children. While it may be given to children for selected conditions, precautions do apply. Overdosage: If you think you have taken too much of this medicine contact a poison control center or emergency room at once. NOTE: This medicine is only for you. Do not share this medicine with others. What if I miss a dose? Keep appointments for follow-up doses. It is important not to miss your dose. Call your care team if you are unable to keep an appointment. What may interact with this medication? Do not take this medication with any of the following: Live virus vaccines Other medications may affect the way this medication works. Talk with your care team about all of the medications you take. They may suggest changes to your treatment plan to lower the risk of side effects and to make sure your medications work as intended. This list may not describe all possible interactions. Give your health care provider a list of all the medicines, herbs, non-prescription drugs, or dietary supplements you use. Also tell them if you smoke, drink alcohol, or use illegal drugs. Some items may interact with your medicine. What should I watch for while using this medication? Your condition will be monitored carefully while you are receiving this medication. You may need blood work while  taking this medication. This medication may make you feel generally unwell. This is not uncommon as chemotherapy can affect healthy cells as well as cancer cells. Report any side effects. Continue your course of treatment even though you feel ill unless your care team tells you to stop. This medication can cause serious allergic reactions. To reduce the risk, your care team may give you other medications to take before receiving this one. Be sure to follow the directions from your care team. This medication may increase your risk of getting an infection. Call your care team for advice if you get a fever, chills, sore throat, or other symptoms of a cold or flu. Do not treat yourself. Try to avoid being around people who are sick. This medication may increase your risk to bruise or bleed. Call your care team if you notice any unusual bleeding. Be careful brushing or flossing your teeth or using a toothpick because you may get an infection or bleed more easily. If you have any dental work done, tell your dentist you are receiving this medication. Talk to your  care team if you may be pregnant. Serious birth defects can occur if you take this medication during pregnancy. Talk to your care team before breastfeeding. Changes to your treatment plan may be needed. What side effects may I notice from receiving this medication? Side effects that you should report to your care team as soon as possible: Allergic reactions--skin rash, itching, hives, swelling of the face, lips, tongue, or throat Heart rhythm changes--fast or irregular heartbeat, dizziness, feeling faint or lightheaded, chest pain, trouble breathing Increase in blood pressure Infection--fever, chills, cough, sore throat, wounds that don't heal, pain or trouble when passing urine, general feeling of discomfort or being unwell Low blood pressure--dizziness, feeling faint or lightheaded, blurry vision Low red blood cell level--unusual weakness or  fatigue, dizziness, headache, trouble breathing Painful swelling, warmth, or redness of the skin, blisters or sores at the infusion site Pain, tingling, or numbness in the hands or feet Slow heartbeat--dizziness, feeling faint or lightheaded, confusion, trouble breathing, unusual weakness or fatigue Unusual bruising or bleeding Side effects that usually do not require medical attention (report to your care team if they continue or are bothersome): Diarrhea Hair loss Joint pain Loss of appetite Muscle pain Nausea Vomiting This list may not describe all possible side effects. Call your doctor for medical advice about side effects. You may report side effects to FDA at 1-800-FDA-1088. Where should I keep my medication? This medication is given in a hospital or clinic. It will not be stored at home. NOTE: This sheet is a summary. It may not cover all possible information. If you have questions about this medicine, talk to your doctor, pharmacist, or health care provider.  2024 Elsevier/Gold Standard (2021-09-18 00:00:00)

## 2023-09-16 ENCOUNTER — Ambulatory Visit
Admission: RE | Admit: 2023-09-16 | Discharge: 2023-09-16 | Disposition: A | Discharge status: Home / Self Care | Source: Ambulatory Visit | Attending: Radiation Oncology | Admitting: Radiation Oncology

## 2023-09-16 ENCOUNTER — Telehealth: Payer: Self-pay | Admitting: *Deleted

## 2023-09-16 ENCOUNTER — Other Ambulatory Visit: Payer: Self-pay

## 2023-09-16 DIAGNOSIS — Z5111 Encounter for antineoplastic chemotherapy: Secondary | ICD-10-CM | POA: Diagnosis not present

## 2023-09-16 DIAGNOSIS — Z79899 Other long term (current) drug therapy: Secondary | ICD-10-CM | POA: Diagnosis not present

## 2023-09-16 DIAGNOSIS — Z87891 Personal history of nicotine dependence: Secondary | ICD-10-CM | POA: Diagnosis not present

## 2023-09-16 DIAGNOSIS — C3432 Malignant neoplasm of lower lobe, left bronchus or lung: Secondary | ICD-10-CM

## 2023-09-16 DIAGNOSIS — Z510 Encounter for antineoplastic radiation therapy: Secondary | ICD-10-CM | POA: Diagnosis not present

## 2023-09-16 DIAGNOSIS — R799 Abnormal finding of blood chemistry, unspecified: Secondary | ICD-10-CM | POA: Diagnosis not present

## 2023-09-16 LAB — RAD ONC ARIA SESSION SUMMARY
Course Elapsed Days: 0
Plan Fractions Treated to Date: 1
Plan Prescribed Dose Per Fraction: 2 Gy
Plan Total Fractions Prescribed: 30
Plan Total Prescribed Dose: 60 Gy
Reference Point Dosage Given to Date: 2 Gy
Reference Point Session Dosage Given: 2 Gy
Session Number: 1

## 2023-09-16 MED ORDER — SONAFINE EX EMUL
1.0000 | Freq: Once | CUTANEOUS | Status: AC
  Administered 2023-09-16: 1 via TOPICAL

## 2023-09-16 NOTE — Progress Notes (Signed)
 Pt here for patient teaching. Pt given Radiation and You booklet, skin care instructions, and Sonafine. Reviewed areas of pertinence such as fatigue, hair loss, mouth changes, skin changes, throat changes, cough, and shortness of breath . Pt able to give teach back of to pat skin, use unscented/gentle soap, and drink plenty of water ,apply Sonafine bid and avoid applying anything to skin within 4 hours of treatment. Pt verbalizes understanding of information given and will contact nursing with any questions or concerns.

## 2023-09-16 NOTE — Telephone Encounter (Signed)
-----   Message from Nurse Evelia Hipp sent at 09/15/2023  3:43 PM EDT ----- Regarding: First time Taxol and Carbo First time Carbo and Taxol, patient tolerated it well.

## 2023-09-16 NOTE — Telephone Encounter (Signed)
 Called pt to see how she did with her recent treatment.  She reports doing well & denied any problems but asked if chemo made your blood sugar go up.  Explained that she received a steroid, decadron with her treatment that most likely made sugar go up but should come down.  Encouraged to drink plenty of fluids & get some exercise & avoid high carbs.  Encouraged to watch & report to her MD if continues to be a problem.  She knows her next appt & how to reach us  if needed.

## 2023-09-17 ENCOUNTER — Other Ambulatory Visit: Payer: Self-pay

## 2023-09-17 ENCOUNTER — Ambulatory Visit
Admission: RE | Admit: 2023-09-17 | Discharge: 2023-09-17 | Disposition: A | Discharge status: Home / Self Care | Source: Ambulatory Visit | Attending: Radiation Oncology | Admitting: Radiation Oncology

## 2023-09-17 DIAGNOSIS — Z5111 Encounter for antineoplastic chemotherapy: Secondary | ICD-10-CM | POA: Diagnosis not present

## 2023-09-17 DIAGNOSIS — Z510 Encounter for antineoplastic radiation therapy: Secondary | ICD-10-CM | POA: Diagnosis not present

## 2023-09-17 DIAGNOSIS — Z87891 Personal history of nicotine dependence: Secondary | ICD-10-CM | POA: Diagnosis not present

## 2023-09-17 DIAGNOSIS — C3432 Malignant neoplasm of lower lobe, left bronchus or lung: Secondary | ICD-10-CM | POA: Diagnosis not present

## 2023-09-17 DIAGNOSIS — R799 Abnormal finding of blood chemistry, unspecified: Secondary | ICD-10-CM | POA: Diagnosis not present

## 2023-09-17 DIAGNOSIS — Z79899 Other long term (current) drug therapy: Secondary | ICD-10-CM | POA: Diagnosis not present

## 2023-09-17 LAB — RAD ONC ARIA SESSION SUMMARY
Course Elapsed Days: 1
Plan Fractions Treated to Date: 2
Plan Prescribed Dose Per Fraction: 2 Gy
Plan Total Fractions Prescribed: 30
Plan Total Prescribed Dose: 60 Gy
Reference Point Dosage Given to Date: 4 Gy
Reference Point Session Dosage Given: 2 Gy
Session Number: 2

## 2023-09-18 ENCOUNTER — Ambulatory Visit
Admission: RE | Admit: 2023-09-18 | Discharge: 2023-09-18 | Disposition: A | Discharge status: Home / Self Care | Source: Ambulatory Visit | Attending: Radiation Oncology | Admitting: Radiation Oncology

## 2023-09-18 ENCOUNTER — Other Ambulatory Visit: Payer: Self-pay

## 2023-09-18 DIAGNOSIS — Z5111 Encounter for antineoplastic chemotherapy: Secondary | ICD-10-CM | POA: Diagnosis not present

## 2023-09-18 DIAGNOSIS — C3432 Malignant neoplasm of lower lobe, left bronchus or lung: Secondary | ICD-10-CM | POA: Diagnosis not present

## 2023-09-18 DIAGNOSIS — Z79899 Other long term (current) drug therapy: Secondary | ICD-10-CM | POA: Diagnosis not present

## 2023-09-18 DIAGNOSIS — R799 Abnormal finding of blood chemistry, unspecified: Secondary | ICD-10-CM | POA: Diagnosis not present

## 2023-09-18 DIAGNOSIS — Z510 Encounter for antineoplastic radiation therapy: Secondary | ICD-10-CM | POA: Diagnosis not present

## 2023-09-18 DIAGNOSIS — Z87891 Personal history of nicotine dependence: Secondary | ICD-10-CM | POA: Diagnosis not present

## 2023-09-18 LAB — RAD ONC ARIA SESSION SUMMARY
Course Elapsed Days: 2
Plan Fractions Treated to Date: 3
Plan Prescribed Dose Per Fraction: 2 Gy
Plan Total Fractions Prescribed: 30
Plan Total Prescribed Dose: 60 Gy
Reference Point Dosage Given to Date: 6 Gy
Reference Point Session Dosage Given: 2 Gy
Session Number: 3

## 2023-09-19 ENCOUNTER — Ambulatory Visit
Admission: RE | Admit: 2023-09-19 | Discharge: 2023-09-19 | Disposition: A | Discharge status: Home / Self Care | Source: Ambulatory Visit | Attending: Radiation Oncology | Admitting: Radiation Oncology

## 2023-09-19 ENCOUNTER — Other Ambulatory Visit: Payer: Self-pay

## 2023-09-19 DIAGNOSIS — R799 Abnormal finding of blood chemistry, unspecified: Secondary | ICD-10-CM | POA: Diagnosis not present

## 2023-09-19 DIAGNOSIS — Z87891 Personal history of nicotine dependence: Secondary | ICD-10-CM | POA: Diagnosis not present

## 2023-09-19 DIAGNOSIS — C3432 Malignant neoplasm of lower lobe, left bronchus or lung: Secondary | ICD-10-CM | POA: Diagnosis not present

## 2023-09-19 DIAGNOSIS — Z5111 Encounter for antineoplastic chemotherapy: Secondary | ICD-10-CM | POA: Diagnosis not present

## 2023-09-19 DIAGNOSIS — Z510 Encounter for antineoplastic radiation therapy: Secondary | ICD-10-CM | POA: Diagnosis not present

## 2023-09-19 DIAGNOSIS — Z79899 Other long term (current) drug therapy: Secondary | ICD-10-CM | POA: Diagnosis not present

## 2023-09-19 LAB — RAD ONC ARIA SESSION SUMMARY
Course Elapsed Days: 3
Plan Fractions Treated to Date: 4
Plan Prescribed Dose Per Fraction: 2 Gy
Plan Total Fractions Prescribed: 30
Plan Total Prescribed Dose: 60 Gy
Reference Point Dosage Given to Date: 8 Gy
Reference Point Session Dosage Given: 2 Gy
Session Number: 4

## 2023-09-19 NOTE — Progress Notes (Unsigned)
 [facility] OFFICE PROGRESS NOTE  [name], MD [address] [place] [address]  DIAGNOSIS: stage IIIa (T3, N1, M0) non-small cell lung cancer, squamous cell carcinoma with a focal area of small cell presented with large left lower lobe lung mass in addition to left hilar lymphadenopathy diagnosed in April 2025.   PRIOR THERAPY: None  CURRENT THERAPY: Concurrent chemoradiation with carboplatin  for an AUC of 2 paclitaxel  45 mg/m.  First dose on 09/15/23.  Status post 1 cycle of treatment.  INTERVAL HISTORY: Pamela [name] 73 y.o. female returns to clinic today for follow-up visit.  The patient establish care with Dr. Marguerita Shih on 09/09/2023.  The patient is currently undergoing a course of concurrent chemoradiation with carboplatin  for an AUC of 2 and paclitaxel  45 mg/m.  She underwent her first cycle of treatment last week.  Last day radiation is 10/31/2023.  She recently completed her first week of chemotherapy, which was generally well-tolerated. She experienced severe pain in her lower back, where the cancer is located, on one occasion. This pain was managed with tylenol . However, she has been taking a lot of tylenol  (6 500 mg tablets daily for 2 weeks). She was prescribed oxycodone  by radiation. She has not needed this yet due to the pain resolving. Her liver enzymes were slightly elevated yesterday. She is not able to take NSAIDs since she had kidney transplant. She has not experienced any significant nausea or vomiting, except for one instance, and has nausea medication available if needed.  Her blood sugar levels have increased, which she attributes to the steroid premedication given with her chemotherapy. She is monitoring her blood sugar closely and adjusting her insulin  dosage accordingly. No fevers, chills, or night sweats have been noted. Her weight remains stable, and her appetite is good, following advice to eat three small meals a day.  Today the  patient denies any fever, chills, night sweats, or unexplained weight.   Her breathing is improved. She states she is breathing/shortness of breath is better. She denies cough, chest pain, or hemoptysis.  Denies any diarrhea or constipation. Denies any headache or visual changes.  She is here today for evaluation and repeat blood work before undergoing #2.   MEDICAL HISTORY: Past Medical History:  Diagnosis Date   Anemia    Anxiety    CHF (congestive heart failure) (HCC)    Chronic kidney disease    COPD (chronic obstructive pulmonary disease) (HCC)    Coronary artery disease    Diabetes mellitus without complication (HCC) 10-29-12   Headache    Hypertension    PONV (postoperative nausea and vomiting)    Shortness of breath dyspnea     ALLERGIES:  has no known allergies.  MEDICATIONS:  Current Outpatient Medications  Medication Sig Dispense Refill   Accu-Chek Softclix Lancets lancets DX: E11.21 as directed     acetaminophen  (TYLENOL ) 650 MG CR tablet Take by mouth.     albuterol  (VENTOLIN  HFA) 108 (90 Base) MCG/ACT inhaler 1 puff as needed.     amLODipine  (NORVASC ) 10 MG tablet Take 10 mg by mouth daily.     aspirin  EC 81 MG tablet Take 81 mg by mouth daily.     atorvastatin  (LIPITOR) 10 MG tablet Take 1 tablet (10 mg total) by mouth daily. 90 tablet 3   Blood Glucose Calibration (ACCU-CHEK AVIVA) SOLN DX: E11.21 as directed     carvedilol  (COREG ) 12.5 MG tablet Take 1 tablet (12.5 mg total) by mouth  2 (two) times daily. 180 tablet 3   COVID-19 mRNA bivalent vaccine, Pfizer, (PFIZER COVID-19 VAC BIVALENT) injection Inject into the muscle. (Patient not taking: Reported on 09/08/2023) 0.3 mL 0   COVID-19 mRNA Vac-TriS, Pfizer, (PFIZER-BIONT COVID-19 VAC-TRIS) SUSP injection Inject into the muscle. (Patient not taking: Reported on 09/08/2023) 0.3 mL 0   glimepiride  (AMARYL ) 2 MG tablet Take 2 mg by mouth daily.     glucose blood (ACCU-CHEK AVIVA PLUS) test strip use as directed to check   blood sugar 4 times daily     influenza vaccine adjuvanted (FLUAD) 0.5 ML injection Inject into the muscle. 0.5 mL 0   insulin  glargine (LANTUS ) 100 UNIT/ML injection Inject 10 Units into the skin at bedtime.     insulin  glargine (LANTUS ) 100 UNIT/ML injection Inject 30 Units into the skin every morning.     insulin  lispro (HUMALOG) 100 UNIT/ML KiwkPen Inject into the skin. Take 4 units in the skin in the a.m Take 6 units in the skin at lunch Take 8 units in the skin at supper     latanoprost  (XALATAN ) 0.005 % ophthalmic solution Place 1 drop into both eyes at bedtime.     mycophenolate (CELLCEPT) 250 MG capsule Take 750 mg by mouth.  (Patient not taking: Reported on 09/15/2023)     nitroGLYCERIN  (NITROSTAT ) 0.4 MG SL tablet Place 0.4 mg under the tongue every 5 (five) minutes as needed for chest pain.     ondansetron  (ZOFRAN ) 8 MG tablet Take 1 tablet (8 mg total) by mouth every 8 (eight) hours as needed for nausea or vomiting. Start on the third day after chemotherapy. 30 tablet 1   oxyCODONE -acetaminophen  (PERCOCET/ROXICET) 5-325 MG tablet Take 1 tablet by mouth every 6 (six) hours as needed for severe pain (pain score 7-10). 30 tablet 0   predniSONE  (DELTASONE ) 5 MG tablet Take 5 mg by mouth daily with breakfast.      prochlorperazine  (COMPAZINE ) 10 MG tablet Take 1 tablet (10 mg total) by mouth every 6 (six) hours as needed for nausea or vomiting. 30 tablet 1   sirolimus (RAPAMUNE) 1 MG tablet Take 2 mg by mouth daily.     Tacrolimus  1 MG TB24 Take 1 mg by mouth daily.     tacrolimus  ER (ENVARSUS  XR) 1 MG TB24 Take 1 mg by mouth daily.     umeclidinium-vilanterol (ANORO ELLIPTA) 62.5-25 MCG/INH AEPB Inhale 1 puff into the lungs daily.      No current facility-administered medications for this visit.    SURGICAL HISTORY:  Past Surgical History:  Procedure Laterality Date   ABDOMINAL HYSTERECTOMY     ANAL FISSURE REPAIR     APPENDECTOMY     '74- open with gallbladder   AV FISTULA  PLACEMENT Left 01/31/2015   Procedure: ARTERIOVENOUS FISTULA CREATION-LEFT BRACHIO-CEPHALIC ;  Surgeon: Arvil Lauber, MD;  Location: [facility] OR;  Service: Vascular;  Laterality: Left;   AV FISTULA PLACEMENT Left 04/04/2015   Procedure: INSERTION OF ARTERIOVENOUS (AV) GORE-TEX GRAFT ARM;  Surgeon: Richrd Char, MD;  Location: [facility] OR;  Service: Vascular;  Laterality: Left;   BREAST EXCISIONAL BIOPSY Left    BREAST SURGERY     cyst removed   BRONCHIAL BIOPSY  08/18/2023   Procedure: BRONCHOSCOPY, WITH BIOPSY;  Surgeon: Denson Flake, MD;  Location: [facility] ENDOSCOPY;  Service: Pulmonary;;   BRONCHIAL BRUSHINGS  08/18/2023   Procedure: BRONCHOSCOPY, WITH BRUSH BIOPSY;  Surgeon: Denson Flake, MD;  Location: [facility] ENDOSCOPY;  Service: Pulmonary;;  CARDIAC CATHETERIZATION     1 coronary stent placed   CHOLECYSTECTOMY     '74-open   COLONOSCOPY WITH PROPOFOL  N/A 11/17/2012   Procedure: COLONOSCOPY WITH PROPOFOL ;  Surgeon: Garrett Kallman, MD;  Location: [facility] ENDOSCOPY;  Service: Endoscopy;  Laterality: N/A;   CORONARY STENT PLACEMENT     ELBOW SURGERY Right    tendon surgery   ESOPHAGOGASTRODUODENOSCOPY (EGD) WITH PROPOFOL  N/A 11/17/2012   Procedure: ESOPHAGOGASTRODUODENOSCOPY (EGD) WITH PROPOFOL ;  Surgeon: Garrett Kallman, MD;  Location: [facility] ENDOSCOPY;  Service: Endoscopy;  Laterality: N/A;   GANGLION CYST EXCISION Bilateral 10/29/2012   wrist   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   LEFT HEART CATHETERIZATION WITH CORONARY ANGIOGRAM N/A 04/14/2014   Procedure: LEFT HEART CATHETERIZATION WITH CORONARY ANGIOGRAM;  Surgeon: Mickiel Albany, MD;  Location: [facility] CATH LAB;  Service: Cardiovascular;  Laterality: N/A;   PERIPHERAL VASCULAR CATHETERIZATION N/A 03/09/2015   Procedure: Fistulagram;  Surgeon: Arvil Lauber, MD;  Location: [facility] INVASIVE CV LAB;  Service: Cardiovascular;  Laterality: N/A;   TEE WITHOUT CARDIOVERSION N/A 01/20/2015   Procedure: TRANSESOPHAGEAL ECHOCARDIOGRAM (TEE);  Surgeon: Elmyra Haggard, MD;   Location: [facility] ENDOSCOPY;  Service: Cardiovascular;  Laterality: N/A;   TUBAL LIGATION     VIDEO BRONCHOSCOPY  08/18/2023   Procedure: BRONCHOSCOPY, WITH FLUOROSCOPY;  Surgeon: Denson Flake, MD;  Location: [facility] ENDOSCOPY;  Service: Pulmonary;;    REVIEW OF SYSTEMS:   Review of Systems  Constitutional: Negative for appetite change, chills, fatigue, fever and unexpected weight change.  HENT: Negative for mouth sores, nosebleeds, sore throat and trouble swallowing.   Eyes: Negative for eye problems and icterus.  Respiratory: Positive for improved dyspnea on exertion. Negative for cough, hemoptysis, and wheezing.   Cardiovascular: Negative for chest pain and leg swelling.  Gastrointestinal: Negative for abdominal pain, constipation, diarrhea, nausea and vomiting.  Genitourinary: Negative for bladder incontinence, difficulty urinating, dysuria, frequency and hematuria.   Musculoskeletal: Negative for back pain, gait problem, neck pain and neck stiffness.  Skin: Negative for itching and rash.  Neurological: Negative for dizziness, extremity weakness, gait problem, headaches, light-headedness and seizures.  Hematological: Negative for adenopathy. Does not bruise/bleed easily.  Psychiatric/Behavioral: Negative for confusion, depression and sleep disturbance. The patient is not nervous/anxious.     PHYSICAL EXAMINATION:  Blood pressure (!) 144/67, pulse 80, temperature (!) 97.4 F (36.3 C), temperature source Temporal, resp. rate 16, weight 166 lb 12.8 oz (75.7 kg), SpO2 92%.  ECOG PERFORMANCE STATUS: 1  Physical Exam  Constitutional: Oriented to person, place, and time and well-developed, well-nourished, and in no distress.  HENT:  Head: Normocephalic and atraumatic.  Mouth/Throat: Oropharynx is clear and moist. No oropharyngeal exudate.  Eyes: Conjunctivae are normal. Right eye exhibits no discharge. Left eye exhibits no discharge. No scleral icterus.  Neck: Normal range of motion. Neck  supple.  Cardiovascular: Normal rate, regular rhythm, normal heart sounds and intact distal pulses.   Pulmonary/Chest: Effort normal and breath sounds normal. No respiratory distress. No wheezes. No rales.  Abdominal: Soft. Bowel sounds are normal. Exhibits no distension and no mass. There is no tenderness.  Musculoskeletal: Normal range of motion. Exhibits no edema.  Lymphadenopathy:    No cervical adenopathy.  Neurological: Alert and oriented to person, place, and time. Exhibits normal muscle tone. Gait normal. Coordination normal.  Skin: Skin is warm and dry. No rash noted. Not diaphoretic. No erythema. No pallor.  Psychiatric: Mood, memory and judgment normal.  Vitals reviewed.  LABORATORY DATA: Lab Results  Component Value Date   WBC 8.6 09/22/2023   HGB 14.2 09/22/2023   HCT 43.7 09/22/2023   MCV 87.6 09/22/2023   PLT 188 09/22/2023      Chemistry      Component Value Date/Time   NA 140 09/22/2023 0733   K 4.0 09/22/2023 0733   CL 103 09/22/2023 0733   CO2 28 09/22/2023 0733   BUN 15 09/22/2023 0733   CREATININE 0.95 09/22/2023 0733      Component Value Date/Time   CALCIUM  9.2 09/22/2023 0733   CALCIUM  9.4 12/06/2014 0821   ALKPHOS 134 (H) 09/22/2023 0733   AST 62 (H) 09/22/2023 0733   ALT 121 (H) 09/22/2023 0733   BILITOT 0.4 09/22/2023 0733       RADIOGRAPHIC STUDIES:  NM PET Image Initial (PI) Skull Base To Thigh Result Date: 09/05/2023 CLINICAL DATA:  Initial treatment strategy for lung nodule. EXAM: NUCLEAR MEDICINE PET SKULL BASE TO THIGH TECHNIQUE: 8.40 mCi F-18 FDG was injected intravenously. Full-ring PET imaging was performed from the skull base to thigh after the radiotracer. CT data was obtained and used for attenuation correction and anatomic localization. Fasting blood glucose: 198 mg/dl COMPARISON:  Chest CT with contrast 08/08/2023. FINDINGS: Mediastinal blood pool activity: SUV max 3.5 Liver activity: SUV max 4.0. Neck: Mild physiological  pharyngeal uptake. Near symmetric uptake of the visualized intracranial compartment. No specific abnormal uptake elsewhere in the neck including along lymph node change of the submandibular, posterior triangle or internal jugular regions. Incidental CT findings: Visualized portions of the paranasal sinuses and mastoid air cells are clear. The parotid glands, submandibular glands are unremarkable. There is a cystic focus in the left thyroid lobe which again does not show any abnormal uptake but measures up to 18 mm. Confirmatory ultrasound could be considered when clinically appropriate. Scattered vascular calcifications are seen. Chest: Centered in the superior segment of the left lower lobe once again is a large cavitary mass as seen on CT scan. On the prior CT this measured 6.8 x 5.9 cm. Today this has maximum SUV value 11.2. The uptake is along the periphery of lesion most confluent along the inferior aspect as well as medial superiorly. Lesion once again extends from the pleura to the hilum. There is an occluded left lower lobe bronchus identified once again seen such as series CT image 67. No additional areas of abnormal uptake identified in the lungs. There is slight asymmetric uptake seen along the adjacent left lower lobe lung hilum posteriorly the small focus of uptake maximum SUV of 3.7 on image 66. This corresponds to the hilar node described on the CT scan at 12 mm. No other areas of abnormal uptake above blood pool in the axillary region, hilum or mediastinum. Incidental CT findings: Breathing motion. Emphysematous lung changes are identified. No pleural effusion or pneumothorax. No consolidation. There is a vascular stent along the left upper extremity. Coronary artery calcifications are seen. Trace pericardial fluid. The heart is nonenlarged. Slightly patulous thoracic esophagus. Abdomen/pelvis: There is physiologic distribution radiotracer along the parenchymal organs, bowel and renal collecting  systems. Incidental CT findings: Diffuse vascular calcifications are identified. Speckled calcifications in the area of the uncinate process of the pancreas. Severe atrophy of the native kidneys. 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 There is some contrast in the collecting system of the transplant kidney in the bladder. Please correlate with prior MRI of the head. Bowel is nondilated. Scattered stool. Few colonic diverticula. SKELETON: No abnormal uptake along the visualized  osseous structures. Incidental CT findings: Scattered degenerative changes particularly of the spine and pelvis. IMPRESSION: As seen on CT there is a large cavitary mass centered along the superior segment left lower lobe abutting the hilum with marginal abnormal radiotracer uptake. Focal uptake seen along the mildly enlarged left hilar node as seen on prior CT scan could be a regional metastasis. No additional areas of abnormal radiotracer uptake to suggest more distant spread of disease. Left thyroid cystic lesion is not hypermetabolic. Follow up ultrasound could be performed when appropriate. 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 Electronically Signed   By: Adrianna Horde M.D.   On: 09/05/2023 10:39   MR BRAIN W WO CONTRAST Result Date: 09/04/2023 CLINICAL DATA:  Lung cancer staging EXAM: MRI HEAD WITHOUT AND WITH CONTRAST TECHNIQUE: Multiplanar, multiecho pulse sequences of the brain and surrounding structures were obtained without and with intravenous contrast. CONTRAST:  7mL GADAVIST  GADOBUTROL  1 MMOL/ML IV SOLN COMPARISON:  None Available. FINDINGS: Brain: Small foci of diffusion restriction at the superior right frontal cortex and in the left centrum semiovale to subcortical frontal white matter. These are somewhat weakly restricted but no enhancement typical of subacute infarcts. No abnormal enhancement throughout the  brain to suggest metastatic disease. No hemorrhage, hydrocephalus, or collection. Vascular: Major flow voids and vascular enhancements are preserved Skull and upper cervical spine: Normal marrow signal Sinuses/Orbits: Negative Other: These results will be called to the ordering clinician or representative by the Radiologist Assistant, and communication documented in the PACS or [employer]. 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 No abnormal enhancement to suggest metastatic disease. Electronically Signed   By: Ronnette Coke M.D.   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   The patient is currently undergoing concurrent chemoradiation with carboplatin  for an AUC and paclitaxel  45 mg/m.  The patient is status post 1 cycle and tolerated well   Labs were reviewed. It is alright to proceed with treatment with ALT 121 Recommend that she proceed with cycle #2 today scheduled.  We will see her back for follow-up visit in 2 weeks for evaluation and repeat blood work before undergoing cycle #4.   Her blood sugar levels have increased, which she attributes to the steroid premedication given with her chemotherapy. She is monitoring her blood sugar closely and adjusting her insulin  dosage accordingly. No fevers, chills, or night sweats have been noted. Her weight remains stable, and her appetite is good, following advice to eat three small meals a day.  Elevated liver enzymes Mild elevation likely due to high acetaminophen  intake from Tylenol  and oxycodone . - Switch to 325 mg Tylenol  and use the lowest effective dose. - Avoid unnecessary use  of Tylenol . Discussed oxycodone  contains the equivalent of 1 tylenol  to ensure she does not exceed the daily dose of tylenol .  - Consider topical treatments like Salonpas patches or heating pads for pain management, pending radiation therapy guidelines. - Review liver function tests next week.  Diabetes mellitus with insulin  use Elevated blood sugar due to steroid premedication for chemotherapy. - Monitor blood sugar levels  closely. - Adjust insulin  dosage as needed  The patient was advised to call immediately if she has any concerning symptoms in the interval. The patient voices understanding of current disease status and treatment options and is in agreement with the current care plan. All questions were answered. The patient knows to call the clinic with any problems, questions or concerns. We can certainly see the patient much sooner if necessary     No orders of the defined types were placed in this encounter.   The total time spent in the appointment was 20-29 minutes  Sheila Ocasio L Eugene Zeiders, PA-C 09/23/23

## 2023-09-22 ENCOUNTER — Telehealth: Payer: Self-pay

## 2023-09-22 ENCOUNTER — Inpatient Hospital Stay

## 2023-09-22 ENCOUNTER — Other Ambulatory Visit: Payer: Self-pay | Admitting: Radiation Oncology

## 2023-09-22 ENCOUNTER — Other Ambulatory Visit: Payer: Self-pay

## 2023-09-22 ENCOUNTER — Ambulatory Visit
Admission: RE | Admit: 2023-09-22 | Discharge: 2023-09-22 | Disposition: A | Discharge status: Home / Self Care | Source: Ambulatory Visit | Attending: Radiation Oncology | Admitting: Radiation Oncology

## 2023-09-22 DIAGNOSIS — R799 Abnormal finding of blood chemistry, unspecified: Secondary | ICD-10-CM | POA: Diagnosis not present

## 2023-09-22 DIAGNOSIS — C3432 Malignant neoplasm of lower lobe, left bronchus or lung: Secondary | ICD-10-CM

## 2023-09-22 DIAGNOSIS — Z510 Encounter for antineoplastic radiation therapy: Secondary | ICD-10-CM | POA: Diagnosis not present

## 2023-09-22 DIAGNOSIS — Z5111 Encounter for antineoplastic chemotherapy: Secondary | ICD-10-CM | POA: Diagnosis not present

## 2023-09-22 DIAGNOSIS — Z87891 Personal history of nicotine dependence: Secondary | ICD-10-CM | POA: Diagnosis not present

## 2023-09-22 DIAGNOSIS — Z79899 Other long term (current) drug therapy: Secondary | ICD-10-CM | POA: Diagnosis not present

## 2023-09-22 LAB — CBC WITH DIFFERENTIAL (CANCER CENTER ONLY)
Abs Immature Granulocytes: 0.08 10*3/uL — ABNORMAL HIGH (ref 0.00–0.07)
Basophils Absolute: 0 10*3/uL (ref 0.0–0.1)
Basophils Relative: 0 %
Eosinophils Absolute: 0.2 10*3/uL (ref 0.0–0.5)
Eosinophils Relative: 2 %
HCT: 43.7 % (ref 36.0–46.0)
Hemoglobin: 14.2 g/dL (ref 12.0–15.0)
Immature Granulocytes: 1 %
Lymphocytes Relative: 7 %
Lymphs Abs: 0.6 10*3/uL — ABNORMAL LOW (ref 0.7–4.0)
MCH: 28.5 pg (ref 26.0–34.0)
MCHC: 32.5 g/dL (ref 30.0–36.0)
MCV: 87.6 fL (ref 80.0–100.0)
Monocytes Absolute: 0.5 10*3/uL (ref 0.1–1.0)
Monocytes Relative: 6 %
Neutro Abs: 7.2 10*3/uL (ref 1.7–7.7)
Neutrophils Relative %: 84 %
Platelet Count: 188 10*3/uL (ref 150–400)
RBC: 4.99 MIL/uL (ref 3.87–5.11)
RDW: 14.2 % (ref 11.5–15.5)
WBC Count: 8.6 10*3/uL (ref 4.0–10.5)
nRBC: 0 % (ref 0.0–0.2)

## 2023-09-22 LAB — RAD ONC ARIA SESSION SUMMARY
Course Elapsed Days: 6
Plan Fractions Treated to Date: 5
Plan Prescribed Dose Per Fraction: 2 Gy
Plan Total Fractions Prescribed: 30
Plan Total Prescribed Dose: 60 Gy
Reference Point Dosage Given to Date: 10 Gy
Reference Point Session Dosage Given: 2 Gy
Session Number: 5

## 2023-09-22 LAB — CMP (CANCER CENTER ONLY)
ALT: 121 U/L — ABNORMAL HIGH (ref 0–44)
AST: 62 U/L — ABNORMAL HIGH (ref 15–41)
Albumin: 4.1 g/dL (ref 3.5–5.0)
Alkaline Phosphatase: 134 U/L — ABNORMAL HIGH (ref 38–126)
Anion gap: 9 (ref 5–15)
BUN: 15 mg/dL (ref 8–23)
CO2: 28 mmol/L (ref 22–32)
Calcium: 9.2 mg/dL (ref 8.9–10.3)
Chloride: 103 mmol/L (ref 98–111)
Creatinine: 0.95 mg/dL (ref 0.44–1.00)
GFR, Estimated: >60 mL/min (ref >60)
Glucose, Bld: 50 mg/dL — ABNORMAL LOW (ref 70–99)
Potassium: 4 mmol/L (ref 3.5–5.1)
Sodium: 140 mmol/L (ref 135–145)
Total Bilirubin: 0.4 mg/dL (ref 0.0–1.2)
Total Protein: 7 g/dL (ref 6.5–8.1)

## 2023-09-22 MED ORDER — OXYCODONE-ACETAMINOPHEN 5-325 MG PO TABS: 1.0000 | ORAL_TABLET | Freq: Four times a day (QID) | ORAL | 0 refills | Status: DC | PRN

## 2023-09-22 NOTE — Telephone Encounter (Signed)
 Patient called in to report severe pain to left back. Patient received 5th radiation treatment to left lung today. Denies any worsening shortness of breath or cough. Patient is requesting something for pain be called in to [facility] on [address].

## 2023-09-23 ENCOUNTER — Other Ambulatory Visit: Payer: Self-pay

## 2023-09-23 ENCOUNTER — Inpatient Hospital Stay: Admitting: Physician Assistant

## 2023-09-23 ENCOUNTER — Ambulatory Visit
Admission: RE | Admit: 2023-09-23 | Discharge: 2023-09-23 | Disposition: A | Discharge status: Home / Self Care | Source: Ambulatory Visit | Attending: Radiation Oncology | Admitting: Radiation Oncology

## 2023-09-23 ENCOUNTER — Inpatient Hospital Stay

## 2023-09-23 VITALS — BP 150/63 | HR 82 | Resp 16

## 2023-09-23 VITALS — BP 144/67 | HR 80 | Temp 97.4°F | Resp 16 | Wt 166.8 lb

## 2023-09-23 DIAGNOSIS — Z5111 Encounter for antineoplastic chemotherapy: Secondary | ICD-10-CM | POA: Diagnosis not present

## 2023-09-23 DIAGNOSIS — Z87891 Personal history of nicotine dependence: Secondary | ICD-10-CM | POA: Diagnosis not present

## 2023-09-23 DIAGNOSIS — R799 Abnormal finding of blood chemistry, unspecified: Secondary | ICD-10-CM | POA: Diagnosis not present

## 2023-09-23 DIAGNOSIS — Z510 Encounter for antineoplastic radiation therapy: Secondary | ICD-10-CM | POA: Diagnosis not present

## 2023-09-23 DIAGNOSIS — C3432 Malignant neoplasm of lower lobe, left bronchus or lung: Secondary | ICD-10-CM

## 2023-09-23 DIAGNOSIS — Z79899 Other long term (current) drug therapy: Secondary | ICD-10-CM | POA: Diagnosis not present

## 2023-09-23 LAB — RAD ONC ARIA SESSION SUMMARY
Course Elapsed Days: 7
Plan Fractions Treated to Date: 6
Plan Prescribed Dose Per Fraction: 2 Gy
Plan Total Fractions Prescribed: 30
Plan Total Prescribed Dose: 60 Gy
Reference Point Dosage Given to Date: 12 Gy
Reference Point Session Dosage Given: 2 Gy
Session Number: 6

## 2023-09-23 MED ORDER — SODIUM CHLORIDE 0.9 % IV SOLN
45.0000 mg/m2 | Freq: Once | INTRAVENOUS | Status: AC
  Administered 2023-09-23: 84 mg via INTRAVENOUS
  Filled 2023-09-23: qty 14

## 2023-09-23 MED ORDER — PALONOSETRON HCL INJECTION 0.25 MG/5ML
0.2500 mg | Freq: Once | INTRAVENOUS | Status: AC
  Administered 2023-09-23: 0.25 mg via INTRAVENOUS
  Filled 2023-09-23: qty 5

## 2023-09-23 MED ORDER — SODIUM CHLORIDE 0.9 % IV SOLN
INTRAVENOUS | Status: DC
Start: 2023-09-23 — End: 2023-09-23

## 2023-09-23 MED ORDER — FAMOTIDINE IN NACL 20-0.9 MG/50ML-% IV SOLN
20.0000 mg | Freq: Once | INTRAVENOUS | Status: AC
  Administered 2023-09-23: 20 mg via INTRAVENOUS
  Filled 2023-09-23: qty 50

## 2023-09-23 MED ORDER — SODIUM CHLORIDE 0.9 % IV SOLN
170.4000 mg | Freq: Once | INTRAVENOUS | Status: AC
  Administered 2023-09-23: 170 mg via INTRAVENOUS
  Filled 2023-09-23: qty 17

## 2023-09-23 MED ORDER — DIPHENHYDRAMINE HCL 50 MG/ML IJ SOLN
50.0000 mg | Freq: Once | INTRAMUSCULAR | Status: AC
  Administered 2023-09-23: 50 mg via INTRAVENOUS
  Filled 2023-09-23: qty 1

## 2023-09-23 MED ORDER — DEXAMETHASONE SODIUM PHOSPHATE 10 MG/ML IJ SOLN
10.0000 mg | Freq: Once | INTRAMUSCULAR | Status: AC
  Administered 2023-09-23: 10 mg via INTRAVENOUS
  Filled 2023-09-23: qty 1

## 2023-09-23 NOTE — Patient Instructions (Signed)
 [facility] - A DEPT OF [facility]  Discharge Instructions: Thank you for choosing [facility] to provide your oncology and hematology care.   If you have a lab appointment with the Cancer Center, please go directly to the Cancer Center and check in at the registration area.   Wear comfortable clothing and clothing appropriate for easy access to any Portacath or PICC line.   We strive to give you quality time with your provider. You may need to reschedule your appointment if you arrive late (15 or more minutes).  Arriving late affects you and other patients whose appointments are after yours.  Also, if you miss three or more appointments without notifying the office, you may be dismissed from the clinic at the provider's discretion.      For prescription refill requests, have your pharmacy contact our office and allow 72 hours for refills to be completed.    Today you received the following chemotherapy and/or immunotherapy agents: CARBOplatin (PARAPLATIN) and PACLitaxel (TAXOL)     To help prevent nausea and vomiting after your treatment, we encourage you to take your nausea medication as directed.  BELOW ARE SYMPTOMS THAT SHOULD BE REPORTED IMMEDIATELY: *FEVER GREATER THAN 100.4 F (38 C) OR HIGHER *CHILLS OR SWEATING *NAUSEA AND VOMITING THAT IS NOT CONTROLLED WITH YOUR NAUSEA MEDICATION *UNUSUAL SHORTNESS OF BREATH *UNUSUAL BRUISING OR BLEEDING *URINARY PROBLEMS (pain or burning when urinating, or frequent urination) *BOWEL PROBLEMS (unusual diarrhea, constipation, pain near the anus) TENDERNESS IN MOUTH AND THROAT WITH OR WITHOUT PRESENCE OF ULCERS (sore throat, sores in mouth, or a toothache) UNUSUAL RASH, SWELLING OR PAIN  UNUSUAL VAGINAL DISCHARGE OR ITCHING   Items with * indicate a potential emergency and should be followed up as soon as possible or go to the Emergency Department if any problems should occur.  Please show the  CHEMOTHERAPY ALERT CARD or IMMUNOTHERAPY ALERT CARD at check-in to the Emergency Department and triage nurse.  Should you have questions after your visit or need to cancel or reschedule your appointment, please contact [facility] - A DEPT OF [facility]  Dept: [phone]  and follow the prompts.  Office hours are 8:00 a.m. to 4:30 p.m. Monday - Friday. Please note that voicemails left after 4:00 p.m. may not be returned until the following business day.  We are closed weekends and major holidays. You have access to a nurse at all times for urgent questions. Please call the main number to the clinic Dept: [phone] and follow the prompts.   For any non-urgent questions, you may also contact your provider using MyChart. We now offer e-Visits for anyone 16 and older to request care online for non-urgent symptoms. For details visit [email or web address].   Also download the MyChart app! Go to the app store, search "MyChart", open the app, select [facility], and log in with your MyChart username and password.  Carboplatin Injection What is this medication? CARBOPLATIN (KAR boe pla tin) treats some types of cancer. It works by slowing down the growth of cancer cells. This medicine may be used for other purposes; ask your health care provider or pharmacist if you have questions. COMMON BRAND NAME(S): Paraplatin What should I tell my care team before I take this medication? They need to know if you have any of these conditions: Blood disorders Hearing problems Kidney disease Recent or ongoing radiation therapy An unusual or allergic reaction to  carboplatin, cisplatin, other medications, foods, dyes, or preservatives Pregnant or trying to get pregnant Breast-feeding How should I use this medication? This medication is injected into a vein. It is given by your care team in a hospital or clinic setting. Talk to your care team about the use of this medication in  children. Special care may be needed. Overdosage: If you think you have taken too much of this medicine contact a poison control center or emergency room at once. NOTE: This medicine is only for you. Do not share this medicine with others. What if I miss a dose? Keep appointments for follow-up doses. It is important not to miss your dose. Call your care team if you are unable to keep an appointment. What may interact with this medication? Medications for seizures Some antibiotics, such as amikacin, gentamicin, neomycin, streptomycin, tobramycin Vaccines This list may not describe all possible interactions. Give your health care provider a list of all the medicines, herbs, non-prescription drugs, or dietary supplements you use. Also tell them if you smoke, drink alcohol, or use illegal drugs. Some items may interact with your medicine. What should I watch for while using this medication? Your condition will be monitored carefully while you are receiving this medication. You may need blood work while taking this medication. This medication may make you feel generally unwell. This is not uncommon, as chemotherapy can affect healthy cells as well as cancer cells. Report any side effects. Continue your course of treatment even though you feel ill unless your care team tells you to stop. In some cases, you may be given additional medications to help with side effects. Follow all directions for their use. This medication may increase your risk of getting an infection. Call your care team for advice if you get a fever, chills, sore throat, or other symptoms of a cold or flu. Do not treat yourself. Try to avoid being around people who are sick. Avoid taking medications that contain aspirin, acetaminophen, ibuprofen, naproxen, or ketoprofen unless instructed by your care team. These medications may hide a fever. Be careful brushing or flossing your teeth or using a toothpick because you may get an infection or  bleed more easily. If you have any dental work done, tell your dentist you are receiving this medication. Talk to your care team if you wish to become pregnant or think you might be pregnant. This medication can cause serious birth defects. Talk to your care team about effective forms of contraception. Do not breast-feed while taking this medication. What side effects may I notice from receiving this medication? Side effects that you should report to your care team as soon as possible: Allergic reactions--skin rash, itching, hives, swelling of the face, lips, tongue, or throat Infection--fever, chills, cough, sore throat, wounds that don't heal, pain or trouble when passing urine, general feeling of discomfort or being unwell Low red blood cell level--unusual weakness or fatigue, dizziness, headache, trouble breathing Pain, tingling, or numbness in the hands or feet, muscle weakness, change in vision, confusion or trouble speaking, loss of balance or coordination, trouble walking, seizures Unusual bruising or bleeding Side effects that usually do not require medical attention (report to your care team if they continue or are bothersome): Hair loss Nausea Unusual weakness or fatigue Vomiting This list may not describe all possible side effects. Call your doctor for medical advice about side effects. You may report side effects to FDA at 1-800-FDA-1088. Where should I keep my medication? This medication is given in  a hospital or clinic. It will not be stored at home. NOTE: This sheet is a summary. It may not cover all possible information. If you have questions about this medicine, talk to your doctor, pharmacist, or health care provider.  2024 Elsevier/Gold Standard (2021-08-21 00:00:00)  Paclitaxel Injection What is this medication? PACLITAXEL (PAK li TAX el) treats some types of cancer. It works by slowing down the growth of cancer cells. This medicine may be used for other purposes; ask  your health care provider or pharmacist if you have questions. COMMON BRAND NAME(S): Onxol, Taxol What should I tell my care team before I take this medication? They need to know if you have any of these conditions: Heart disease Liver disease Low white blood cell levels An unusual or allergic reaction to paclitaxel, other medications, foods, dyes, or preservatives If you or your partner are pregnant or trying to get pregnant Breast-feeding How should I use this medication? This medication is injected into a vein. It is given by your care team in a hospital or clinic setting. Talk to your care team about the use of this medication in children. While it may be given to children for selected conditions, precautions do apply. Overdosage: If you think you have taken too much of this medicine contact a poison control center or emergency room at once. NOTE: This medicine is only for you. Do not share this medicine with others. What if I miss a dose? Keep appointments for follow-up doses. It is important not to miss your dose. Call your care team if you are unable to keep an appointment. What may interact with this medication? Do not take this medication with any of the following: Live virus vaccines Other medications may affect the way this medication works. Talk with your care team about all of the medications you take. They may suggest changes to your treatment plan to lower the risk of side effects and to make sure your medications work as intended. This list may not describe all possible interactions. Give your health care provider a list of all the medicines, herbs, non-prescription drugs, or dietary supplements you use. Also tell them if you smoke, drink alcohol, or use illegal drugs. Some items may interact with your medicine. What should I watch for while using this medication? Your condition will be monitored carefully while you are receiving this medication. You may need blood work while  taking this medication. This medication may make you feel generally unwell. This is not uncommon as chemotherapy can affect healthy cells as well as cancer cells. Report any side effects. Continue your course of treatment even though you feel ill unless your care team tells you to stop. This medication can cause serious allergic reactions. To reduce the risk, your care team may give you other medications to take before receiving this one. Be sure to follow the directions from your care team. This medication may increase your risk of getting an infection. Call your care team for advice if you get a fever, chills, sore throat, or other symptoms of a cold or flu. Do not treat yourself. Try to avoid being around people who are sick. This medication may increase your risk to bruise or bleed. Call your care team if you notice any unusual bleeding. Be careful brushing or flossing your teeth or using a toothpick because you may get an infection or bleed more easily. If you have any dental work done, tell your dentist you are receiving this medication. Talk to your  care team if you may be pregnant. Serious birth defects can occur if you take this medication during pregnancy. Talk to your care team before breastfeeding. Changes to your treatment plan may be needed. What side effects may I notice from receiving this medication? Side effects that you should report to your care team as soon as possible: Allergic reactions--skin rash, itching, hives, swelling of the face, lips, tongue, or throat Heart rhythm changes--fast or irregular heartbeat, dizziness, feeling faint or lightheaded, chest pain, trouble breathing Increase in blood pressure Infection--fever, chills, cough, sore throat, wounds that don't heal, pain or trouble when passing urine, general feeling of discomfort or being unwell Low blood pressure--dizziness, feeling faint or lightheaded, blurry vision Low red blood cell level--unusual weakness or  fatigue, dizziness, headache, trouble breathing Painful swelling, warmth, or redness of the skin, blisters or sores at the infusion site Pain, tingling, or numbness in the hands or feet Slow heartbeat--dizziness, feeling faint or lightheaded, confusion, trouble breathing, unusual weakness or fatigue Unusual bruising or bleeding Side effects that usually do not require medical attention (report to your care team if they continue or are bothersome): Diarrhea Hair loss Joint pain Loss of appetite Muscle pain Nausea Vomiting This list may not describe all possible side effects. Call your doctor for medical advice about side effects. You may report side effects to FDA at 1-800-FDA-1088. Where should I keep my medication? This medication is given in a hospital or clinic. It will not be stored at home. NOTE: This sheet is a summary. It may not cover all possible information. If you have questions about this medicine, talk to your doctor, pharmacist, or health care provider.  2024 Elsevier/Gold Standard (2021-09-18 00:00:00)

## 2023-09-24 ENCOUNTER — Other Ambulatory Visit: Payer: Self-pay

## 2023-09-24 ENCOUNTER — Ambulatory Visit
Admission: RE | Admit: 2023-09-24 | Discharge: 2023-09-24 | Disposition: A | Discharge status: Home / Self Care | Source: Ambulatory Visit | Attending: Radiation Oncology | Admitting: Radiation Oncology

## 2023-09-24 DIAGNOSIS — Z510 Encounter for antineoplastic radiation therapy: Secondary | ICD-10-CM | POA: Diagnosis not present

## 2023-09-24 DIAGNOSIS — Z87891 Personal history of nicotine dependence: Secondary | ICD-10-CM | POA: Diagnosis not present

## 2023-09-24 DIAGNOSIS — C3432 Malignant neoplasm of lower lobe, left bronchus or lung: Secondary | ICD-10-CM | POA: Diagnosis not present

## 2023-09-24 DIAGNOSIS — Z5111 Encounter for antineoplastic chemotherapy: Secondary | ICD-10-CM | POA: Diagnosis not present

## 2023-09-24 DIAGNOSIS — R799 Abnormal finding of blood chemistry, unspecified: Secondary | ICD-10-CM | POA: Diagnosis not present

## 2023-09-24 DIAGNOSIS — Z79899 Other long term (current) drug therapy: Secondary | ICD-10-CM | POA: Diagnosis not present

## 2023-09-24 LAB — RAD ONC ARIA SESSION SUMMARY
Course Elapsed Days: 8
Plan Fractions Treated to Date: 7
Plan Prescribed Dose Per Fraction: 2 Gy
Plan Total Fractions Prescribed: 30
Plan Total Prescribed Dose: 60 Gy
Reference Point Dosage Given to Date: 14 Gy
Reference Point Session Dosage Given: 2 Gy
Session Number: 7

## 2023-09-25 ENCOUNTER — Other Ambulatory Visit: Payer: Self-pay

## 2023-09-25 ENCOUNTER — Ambulatory Visit
Admission: RE | Admit: 2023-09-25 | Discharge: 2023-09-25 | Disposition: A | Discharge status: Home / Self Care | Source: Ambulatory Visit | Attending: Radiation Oncology | Admitting: Radiation Oncology

## 2023-09-25 DIAGNOSIS — Z87891 Personal history of nicotine dependence: Secondary | ICD-10-CM | POA: Diagnosis not present

## 2023-09-25 DIAGNOSIS — Z79899 Other long term (current) drug therapy: Secondary | ICD-10-CM | POA: Diagnosis not present

## 2023-09-25 DIAGNOSIS — R799 Abnormal finding of blood chemistry, unspecified: Secondary | ICD-10-CM | POA: Diagnosis not present

## 2023-09-25 DIAGNOSIS — Z510 Encounter for antineoplastic radiation therapy: Secondary | ICD-10-CM | POA: Diagnosis not present

## 2023-09-25 DIAGNOSIS — Z5111 Encounter for antineoplastic chemotherapy: Secondary | ICD-10-CM | POA: Diagnosis not present

## 2023-09-25 DIAGNOSIS — C3432 Malignant neoplasm of lower lobe, left bronchus or lung: Secondary | ICD-10-CM | POA: Diagnosis not present

## 2023-09-25 LAB — RAD ONC ARIA SESSION SUMMARY
Course Elapsed Days: 9
Plan Fractions Treated to Date: 8
Plan Prescribed Dose Per Fraction: 2 Gy
Plan Total Fractions Prescribed: 30
Plan Total Prescribed Dose: 60 Gy
Reference Point Dosage Given to Date: 16 Gy
Reference Point Session Dosage Given: 2 Gy
Session Number: 8

## 2023-09-26 ENCOUNTER — Other Ambulatory Visit: Payer: Self-pay

## 2023-09-26 ENCOUNTER — Ambulatory Visit
Admission: RE | Admit: 2023-09-26 | Discharge: 2023-09-26 | Disposition: A | Discharge status: Home / Self Care | Source: Ambulatory Visit | Attending: Radiation Oncology | Admitting: Radiation Oncology

## 2023-09-26 DIAGNOSIS — Z510 Encounter for antineoplastic radiation therapy: Secondary | ICD-10-CM | POA: Diagnosis not present

## 2023-09-26 DIAGNOSIS — C3432 Malignant neoplasm of lower lobe, left bronchus or lung: Secondary | ICD-10-CM | POA: Diagnosis not present

## 2023-09-26 DIAGNOSIS — R799 Abnormal finding of blood chemistry, unspecified: Secondary | ICD-10-CM | POA: Diagnosis not present

## 2023-09-26 DIAGNOSIS — Z79899 Other long term (current) drug therapy: Secondary | ICD-10-CM | POA: Diagnosis not present

## 2023-09-26 DIAGNOSIS — Z87891 Personal history of nicotine dependence: Secondary | ICD-10-CM | POA: Diagnosis not present

## 2023-09-26 DIAGNOSIS — Z5111 Encounter for antineoplastic chemotherapy: Secondary | ICD-10-CM | POA: Diagnosis not present

## 2023-09-26 LAB — RAD ONC ARIA SESSION SUMMARY
Course Elapsed Days: 10
Plan Fractions Treated to Date: 9
Plan Prescribed Dose Per Fraction: 2 Gy
Plan Total Fractions Prescribed: 30
Plan Total Prescribed Dose: 60 Gy
Reference Point Dosage Given to Date: 18 Gy
Reference Point Session Dosage Given: 2 Gy
Session Number: 9

## 2023-09-29 ENCOUNTER — Inpatient Hospital Stay (HOSPITAL_COMMUNITY)
Admission: EM | Admit: 2023-09-29 | Discharge: 2023-10-01 | DRG: 445 | Disposition: A | Discharge status: Home / Self Care | Attending: Internal Medicine | Admitting: Internal Medicine

## 2023-09-29 ENCOUNTER — Encounter (HOSPITAL_COMMUNITY): Payer: Self-pay | Admitting: Emergency Medicine

## 2023-09-29 ENCOUNTER — Inpatient Hospital Stay

## 2023-09-29 ENCOUNTER — Other Ambulatory Visit: Payer: Self-pay

## 2023-09-29 ENCOUNTER — Other Ambulatory Visit: Payer: Self-pay | Admitting: Internal Medicine

## 2023-09-29 ENCOUNTER — Ambulatory Visit: Admitting: Physician Assistant

## 2023-09-29 ENCOUNTER — Emergency Department (HOSPITAL_COMMUNITY)

## 2023-09-29 ENCOUNTER — Ambulatory Visit
Admission: RE | Admit: 2023-09-29 | Discharge: 2023-09-29 | Disposition: A | Discharge status: Home / Self Care | Source: Ambulatory Visit | Attending: Radiation Oncology | Admitting: Radiation Oncology

## 2023-09-29 VITALS — BP 148/57 | HR 115 | Temp 99.3°F | Resp 18

## 2023-09-29 DIAGNOSIS — Z79899 Other long term (current) drug therapy: Secondary | ICD-10-CM | POA: Diagnosis not present

## 2023-09-29 DIAGNOSIS — Z90710 Acquired absence of both cervix and uterus: Secondary | ICD-10-CM

## 2023-09-29 DIAGNOSIS — R932 Abnormal findings on diagnostic imaging of liver and biliary tract: Secondary | ICD-10-CM | POA: Diagnosis not present

## 2023-09-29 DIAGNOSIS — Z794 Long term (current) use of insulin: Secondary | ICD-10-CM | POA: Diagnosis not present

## 2023-09-29 DIAGNOSIS — Z9221 Personal history of antineoplastic chemotherapy: Secondary | ICD-10-CM

## 2023-09-29 DIAGNOSIS — Z8249 Family history of ischemic heart disease and other diseases of the circulatory system: Secondary | ICD-10-CM | POA: Diagnosis not present

## 2023-09-29 DIAGNOSIS — Z7984 Long term (current) use of oral hypoglycemic drugs: Secondary | ICD-10-CM | POA: Diagnosis not present

## 2023-09-29 DIAGNOSIS — Z955 Presence of coronary angioplasty implant and graft: Secondary | ICD-10-CM

## 2023-09-29 DIAGNOSIS — K831 Obstruction of bile duct: Principal | ICD-10-CM | POA: Diagnosis present

## 2023-09-29 DIAGNOSIS — E871 Hypo-osmolality and hyponatremia: Secondary | ICD-10-CM

## 2023-09-29 DIAGNOSIS — R197 Diarrhea, unspecified: Secondary | ICD-10-CM | POA: Diagnosis present

## 2023-09-29 DIAGNOSIS — C3432 Malignant neoplasm of lower lobe, left bronchus or lung: Secondary | ICD-10-CM

## 2023-09-29 DIAGNOSIS — I5022 Chronic systolic (congestive) heart failure: Secondary | ICD-10-CM | POA: Diagnosis present

## 2023-09-29 DIAGNOSIS — K838 Other specified diseases of biliary tract: Secondary | ICD-10-CM | POA: Diagnosis not present

## 2023-09-29 DIAGNOSIS — R7989 Other specified abnormal findings of blood chemistry: Secondary | ICD-10-CM | POA: Diagnosis not present

## 2023-09-29 DIAGNOSIS — D649 Anemia, unspecified: Secondary | ICD-10-CM | POA: Diagnosis present

## 2023-09-29 DIAGNOSIS — Z940 Kidney transplant status: Secondary | ICD-10-CM

## 2023-09-29 DIAGNOSIS — N186 End stage renal disease: Secondary | ICD-10-CM | POA: Diagnosis not present

## 2023-09-29 DIAGNOSIS — Z833 Family history of diabetes mellitus: Secondary | ICD-10-CM

## 2023-09-29 DIAGNOSIS — I2510 Atherosclerotic heart disease of native coronary artery without angina pectoris: Secondary | ICD-10-CM | POA: Diagnosis not present

## 2023-09-29 DIAGNOSIS — E876 Hypokalemia: Secondary | ICD-10-CM | POA: Diagnosis present

## 2023-09-29 DIAGNOSIS — K429 Umbilical hernia without obstruction or gangrene: Secondary | ICD-10-CM | POA: Diagnosis not present

## 2023-09-29 DIAGNOSIS — R112 Nausea with vomiting, unspecified: Secondary | ICD-10-CM | POA: Diagnosis present

## 2023-09-29 DIAGNOSIS — Z6830 Body mass index (BMI) 30.0-30.9, adult: Secondary | ICD-10-CM

## 2023-09-29 DIAGNOSIS — E860 Dehydration: Secondary | ICD-10-CM | POA: Diagnosis not present

## 2023-09-29 DIAGNOSIS — R0682 Tachypnea, not elsewhere classified: Secondary | ICD-10-CM | POA: Diagnosis present

## 2023-09-29 DIAGNOSIS — R7401 Elevation of levels of liver transaminase levels: Secondary | ICD-10-CM | POA: Diagnosis not present

## 2023-09-29 DIAGNOSIS — R748 Abnormal levels of other serum enzymes: Secondary | ICD-10-CM

## 2023-09-29 DIAGNOSIS — R17 Unspecified jaundice: Secondary | ICD-10-CM

## 2023-09-29 DIAGNOSIS — Z7982 Long term (current) use of aspirin: Secondary | ICD-10-CM | POA: Diagnosis not present

## 2023-09-29 DIAGNOSIS — K8050 Calculus of bile duct without cholangitis or cholecystitis without obstruction: Secondary | ICD-10-CM | POA: Diagnosis not present

## 2023-09-29 DIAGNOSIS — J449 Chronic obstructive pulmonary disease, unspecified: Secondary | ICD-10-CM | POA: Diagnosis not present

## 2023-09-29 DIAGNOSIS — E669 Obesity, unspecified: Secondary | ICD-10-CM | POA: Diagnosis present

## 2023-09-29 DIAGNOSIS — E785 Hyperlipidemia, unspecified: Secondary | ICD-10-CM | POA: Diagnosis present

## 2023-09-29 DIAGNOSIS — Z7952 Long term (current) use of systemic steroids: Secondary | ICD-10-CM

## 2023-09-29 DIAGNOSIS — D84821 Immunodeficiency due to drugs: Secondary | ICD-10-CM | POA: Diagnosis not present

## 2023-09-29 DIAGNOSIS — Z510 Encounter for antineoplastic radiation therapy: Secondary | ICD-10-CM | POA: Diagnosis not present

## 2023-09-29 DIAGNOSIS — I120 Hypertensive chronic kidney disease with stage 5 chronic kidney disease or end stage renal disease: Secondary | ICD-10-CM | POA: Diagnosis not present

## 2023-09-29 DIAGNOSIS — E1122 Type 2 diabetes mellitus with diabetic chronic kidney disease: Secondary | ICD-10-CM | POA: Diagnosis present

## 2023-09-29 DIAGNOSIS — N281 Cyst of kidney, acquired: Secondary | ICD-10-CM | POA: Diagnosis not present

## 2023-09-29 DIAGNOSIS — K5730 Diverticulosis of large intestine without perforation or abscess without bleeding: Secondary | ICD-10-CM | POA: Diagnosis not present

## 2023-09-29 DIAGNOSIS — Z803 Family history of malignant neoplasm of breast: Secondary | ICD-10-CM | POA: Diagnosis not present

## 2023-09-29 DIAGNOSIS — Z7960 Long term (current) use of unspecified immunomodulators and immunosuppressants: Secondary | ICD-10-CM

## 2023-09-29 DIAGNOSIS — D696 Thrombocytopenia, unspecified: Secondary | ICD-10-CM | POA: Diagnosis present

## 2023-09-29 DIAGNOSIS — Z87891 Personal history of nicotine dependence: Secondary | ICD-10-CM

## 2023-09-29 DIAGNOSIS — F419 Anxiety disorder, unspecified: Secondary | ICD-10-CM | POA: Diagnosis present

## 2023-09-29 DIAGNOSIS — Z923 Personal history of irradiation: Secondary | ICD-10-CM

## 2023-09-29 DIAGNOSIS — N200 Calculus of kidney: Secondary | ICD-10-CM | POA: Diagnosis not present

## 2023-09-29 DIAGNOSIS — Z85118 Personal history of other malignant neoplasm of bronchus and lung: Secondary | ICD-10-CM

## 2023-09-29 LAB — CBC WITH DIFFERENTIAL (CANCER CENTER ONLY)
Abs Immature Granulocytes: 0.06 10*3/uL (ref 0.00–0.07)
Basophils Absolute: 0 10*3/uL (ref 0.0–0.1)
Basophils Relative: 0 %
Eosinophils Absolute: 0 10*3/uL (ref 0.0–0.5)
Eosinophils Relative: 0 %
HCT: 42.5 % (ref 36.0–46.0)
Hemoglobin: 14.4 g/dL (ref 12.0–15.0)
Immature Granulocytes: 1 %
Lymphocytes Relative: 2 %
Lymphs Abs: 0.1 10*3/uL — ABNORMAL LOW (ref 0.7–4.0)
MCH: 28.4 pg (ref 26.0–34.0)
MCHC: 33.9 g/dL (ref 30.0–36.0)
MCV: 83.8 fL (ref 80.0–100.0)
Monocytes Absolute: 0.6 10*3/uL (ref 0.1–1.0)
Monocytes Relative: 8 %
Neutro Abs: 6.8 10*3/uL (ref 1.7–7.7)
Neutrophils Relative %: 89 %
Platelet Count: 113 10*3/uL — ABNORMAL LOW (ref 150–400)
RBC: 5.07 MIL/uL (ref 3.87–5.11)
RDW: 13.7 % (ref 11.5–15.5)
WBC Count: 7.6 10*3/uL (ref 4.0–10.5)
nRBC: 0 % (ref 0.0–0.2)

## 2023-09-29 LAB — CMP (CANCER CENTER ONLY)
ALT: 421 U/L (ref 0–44)
AST: 220 U/L (ref 15–41)
Albumin: 3.8 g/dL (ref 3.5–5.0)
Alkaline Phosphatase: 267 U/L — ABNORMAL HIGH (ref 38–126)
Anion gap: 12 (ref 5–15)
BUN: 15 mg/dL (ref 8–23)
CO2: 23 mmol/L (ref 22–32)
Calcium: 8.6 mg/dL — ABNORMAL LOW (ref 8.9–10.3)
Chloride: 94 mmol/L — ABNORMAL LOW (ref 98–111)
Creatinine: 0.99 mg/dL (ref 0.44–1.00)
GFR, Estimated: >60 mL/min (ref >60)
Glucose, Bld: 174 mg/dL — ABNORMAL HIGH (ref 70–99)
Potassium: 3.8 mmol/L (ref 3.5–5.1)
Sodium: 129 mmol/L — ABNORMAL LOW (ref 135–145)
Total Bilirubin: 4.9 mg/dL (ref 0.0–1.2)
Total Protein: 6.7 g/dL (ref 6.5–8.1)

## 2023-09-29 LAB — RAD ONC ARIA SESSION SUMMARY
Course Elapsed Days: 13
Plan Fractions Treated to Date: 10
Plan Prescribed Dose Per Fraction: 2 Gy
Plan Total Fractions Prescribed: 30
Plan Total Prescribed Dose: 60 Gy
Reference Point Dosage Given to Date: 20 Gy
Reference Point Session Dosage Given: 2 Gy
Session Number: 10

## 2023-09-29 LAB — LIPASE, BLOOD: Lipase: 19 U/L (ref 11–51)

## 2023-09-29 LAB — ACETAMINOPHEN LEVEL: Acetaminophen (Tylenol), Serum: <10 ug/mL — ABNORMAL LOW (ref 10–30)

## 2023-09-29 LAB — GLUCOSE, CAPILLARY
Glucose-Capillary: 205 mg/dL — ABNORMAL HIGH (ref 70–99)
Glucose-Capillary: 84 mg/dL (ref 70–99)

## 2023-09-29 MED ORDER — SODIUM CHLORIDE 0.9 % IV SOLN: INTRAVENOUS | Status: AC

## 2023-09-29 MED ORDER — PROCHLORPERAZINE EDISYLATE 10 MG/2ML IJ SOLN
10.0000 mg | Freq: Once | INTRAMUSCULAR | Status: AC
  Administered 2023-09-29: 10 mg via INTRAVENOUS
  Filled 2023-09-29: qty 2

## 2023-09-29 MED ORDER — MORPHINE SULFATE (PF) 2 MG/ML IV SOLN: 2.0000 mg | INTRAVENOUS | Status: DC | PRN

## 2023-09-29 MED ORDER — ALBUTEROL SULFATE (2.5 MG/3ML) 0.083% IN NEBU
2.5000 mg | INHALATION_SOLUTION | Freq: Four times a day (QID) | RESPIRATORY_TRACT | Status: AC | PRN
  Administered 2023-09-29 – 2023-09-30 (×2): 2.5 mg via RESPIRATORY_TRACT
  Filled 2023-09-29 (×2): qty 3

## 2023-09-29 MED ORDER — IOHEXOL 300 MG/ML  SOLN
100.0000 mL | Freq: Once | INTRAMUSCULAR | Status: AC | PRN
  Administered 2023-09-29: 100 mL via INTRAVENOUS

## 2023-09-29 MED ORDER — HYDRALAZINE HCL 20 MG/ML IJ SOLN
5.0000 mg | INTRAMUSCULAR | Status: DC | PRN
  Filled 2023-09-29: qty 1

## 2023-09-29 MED ORDER — INSULIN ASPART 100 UNIT/ML IJ SOLN
0.0000 [IU] | Freq: Three times a day (TID) | INTRAMUSCULAR | Status: DC
  Administered 2023-09-29: 5 [IU] via SUBCUTANEOUS
  Administered 2023-09-30: 8 [IU] via SUBCUTANEOUS
  Administered 2023-10-01: 2 [IU] via SUBCUTANEOUS
  Administered 2023-10-01: 5 [IU] via SUBCUTANEOUS
  Filled 2023-09-29: qty 0.15

## 2023-09-29 MED ORDER — SODIUM CHLORIDE 0.9 % IV SOLN: INTRAVENOUS | Status: DC

## 2023-09-29 MED ORDER — ENOXAPARIN SODIUM 40 MG/0.4ML IJ SOSY
40.0000 mg | PREFILLED_SYRINGE | INTRAMUSCULAR | Status: DC
  Administered 2023-09-29 – 2023-09-30 (×2): 40 mg via SUBCUTANEOUS
  Filled 2023-09-29 (×2): qty 0.4

## 2023-09-29 MED ORDER — OXYCODONE HCL 5 MG PO TABS: 5.0000 mg | ORAL_TABLET | ORAL | Status: DC | PRN

## 2023-09-29 MED ORDER — INSULIN ASPART 100 UNIT/ML IJ SOLN
0.0000 [IU] | Freq: Every day | INTRAMUSCULAR | Status: DC
  Filled 2023-09-29: qty 0.05

## 2023-09-29 MED ORDER — ONDANSETRON HCL 4 MG PO TABS: 4.0000 mg | ORAL_TABLET | Freq: Four times a day (QID) | ORAL | Status: DC | PRN

## 2023-09-29 MED ORDER — ONDANSETRON HCL 4 MG/2ML IJ SOLN
4.0000 mg | Freq: Four times a day (QID) | INTRAMUSCULAR | Status: DC | PRN
  Administered 2023-09-30: 4 mg via INTRAVENOUS
  Filled 2023-09-29: qty 2

## 2023-09-29 MED ORDER — BOOST / RESOURCE BREEZE PO LIQD CUSTOM
1.0000 | Freq: Three times a day (TID) | ORAL | Status: DC
Start: 2023-09-29 — End: 2023-10-01
  Administered 2023-09-29 – 2023-09-30 (×3): 1 via ORAL

## 2023-09-29 NOTE — Progress Notes (Signed)
 Patient to ED by [facility], escorted by Parry Bolster RN

## 2023-09-29 NOTE — ED Notes (Signed)
 Let floor know patient was in MRI would go up after MRI

## 2023-09-29 NOTE — ED Notes (Signed)
 ED TO INPATIENT HANDOFF REPORT  Name/Age/Gender Pamela [name] 73 y.o. female  Code Status    Code Status Orders  (From admission, onward)           Start     Ordered   09/29/23 1432  Full code  Continuous       Question:  By:  Answer:  Consent: discussion documented in EHR   09/29/23 1435           Code Status History     Date Active Date Inactive Code Status Order ID Comments User Context   11/23/2017 1856 11/24/2017 1838 Full Code [id number]  Deforest Fast, MD Inpatient   03/09/2015 0828 03/09/2015 1254 Full Code [id number]  Arvil Lauber, MD Inpatient   01/14/2015 1610 02/01/2015 2205 Partial Code [id number]  Kermitt Pedlar, MD ED   04/14/2014 0931 04/14/2014 1719 Full Code [id number]  Arty Binning, MD Inpatient   03/25/2014 1700 03/28/2014 1410 Full Code [id number]  Miguel Albino, MD Inpatient       Home/SNF/Other Home  Chief Complaint Elevated LFTs [R79.89]  Level of Care/Admitting Diagnosis ED Disposition     ED Disposition  Admit   Condition  --   Comment  Hospital Area: [facility] [[id number]]  Level of Care: Telemetry [5]  Admit to tele based on following criteria: Complex arrhythmia (Bradycardia/Tachycardia)  May place patient in observation at [facility] or [facility] if equivalent level of care is available:: Yes  Covid Evaluation: Asymptomatic - no recent exposure (last 10 days) testing not required  Diagnosis: Elevated LFTs [321910]  Admitting Physician: Audria Leather [[id number]]  Attending Physician: Audria Leather [[id number]]          Medical History Past Medical History:  Diagnosis Date   Anemia    Anxiety    CHF (congestive heart failure) (HCC)    Chronic kidney disease    COPD (chronic obstructive pulmonary disease) (HCC)    Coronary artery disease    Diabetes mellitus without complication (HCC) 10-29-12   Headache    Hypertension    PONV (postoperative nausea and vomiting)    Shortness of breath dyspnea      Allergies Allergies  Allergen Reactions   Atorvastatin      Other Reaction(s): leg cramp    IV Location/Drains/Wounds Patient Lines/Drains/Airways Status     Active Line/Drains/Airways     Name Placement date Placement time Site Days   Peripheral IV 09/29/23 24 G 0.75" Anterior;Right Forearm 09/29/23  0855  Forearm  less than 1   Peripheral IV 09/29/23 20 G Right Antecubital 09/29/23  1128  Antecubital  less than 1   Fistula / Graft Left Upper arm Arteriovenous fistula 01/31/15  1023  Upper arm  3163   Fistula / Graft Left Upper arm Arteriovenous vein graft 04/04/15  1329  Upper arm  3100   Hemodialysis Catheter Right Internal jugular Double-lumen;Permanent 01/25/15  1650  Internal jugular  3169            Labs/Imaging Results for orders placed or performed during the hospital encounter of 09/29/23 (from the past 48 hours)  Lipase, blood     Status: None   Collection Time: 09/29/23 11:13 AM  Result Value Ref Range   Lipase 19 11 - 51 U/L    Comment: Performed at [facility], [address], [place], [place] [address]  Acetaminophen  level     Status: Abnormal   Collection Time: 09/29/23 11:13 AM  Result Value  Ref Range   Acetaminophen  (Tylenol ), Serum <10 (L) 10 - 30 ug/mL    Comment: (NOTE) Therapeutic concentrations vary significantly. A range of 10-30 ug/mL  may be an effective concentration for many patients. However, some  are best treated at concentrations outside of this range. Acetaminophen  concentrations >150 ug/mL at 4 hours after ingestion  and >50 ug/mL at 12 hours after ingestion are often associated with  toxic reactions.  Performed at [facility], [address], [place], [place] [address]    CT ABDOMEN PELVIS W CONTRAST Result Date: 09/29/2023 CLINICAL DATA:  Abdominal pain, acute, nonlocalized. * Tracking Code: BO * EXAM: CT ABDOMEN AND PELVIS WITH CONTRAST TECHNIQUE: Multidetector CT imaging of the abdomen  and pelvis was performed using the standard protocol following bolus administration of intravenous contrast. RADIATION DOSE REDUCTION: This exam was performed according to the departmental dose-optimization program which includes automated exposure control, adjustment of the mA and/or kV according to patient size and/or use of iterative reconstruction technique. CONTRAST:  OMNIPAQUE  IOHEXOL  300 MG/ML  SOLN COMPARISON:  CT scan abdomen and pelvis from 02/01/2016 and PET-CT scan from 09/04/2023. FINDINGS: Lower chest: The lung bases are clear. No pleural effusion. The heart is normal in size. No pericardial effusion. Hepatobiliary: The liver is normal in size. Non-cirrhotic configuration. No suspicious mass. These is mild diffuse hepatic steatosis. There is moderate to severe intra and extrahepatic bile duct dilation up to the level of ampulla of Vater, where there is gradual tapering. No discrete obstructing mass seen. There are subtle faintly calcified 2, 2-3 mm calcifications in the region of ampulla of Vater, which are likely in the pancreatic parenchyma. No discrete choledocholithiasis seen. Gallbladder is surgically absent. Pancreas: Small/atrophic pancreas. There are multiple dystrophic calcifications mainly in the pancreatic head/uncinate process region, likely sequela of chronic pancreatitis. No suspicious mass or peripancreatic fat stranding. Main pancreatic duct is not dilated. Spleen: Within normal limits. No focal lesion. Adrenals/Urinary Tract: Stable bilateral adrenal glands. Redemonstration of small/atrophic bilateral native kidneys. There is a mildly complex cyst arising from the left kidney lower pole measuring at least 1.2 x 1.5 cm, exhibiting single 1-2 mm sized calcified septum. There is a smaller simple cyst arising from the right kidney upper pole, posteriorly. No hydroureteronephrosis on either side. There are several sub 5 mm nonobstructing calculi in bilateral kidneys. No  ureterolithiasis. 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 Unremarkable urinary bladder. Stomach/Bowel: There is small sliding hiatal hernia. No disproportionate dilation of the small or large bowel loops. No evidence of abnormal bowel wall thickening or inflammatory changes. The appendix was not visualized; however there is no acute inflammatory process in the right lower quadrant. There are multiple diverticula mainly in the sigmoid colon, without imaging signs of diverticulitis. Vascular/Lymphatic: No ascites or pneumoperitoneum. No abdominal or pelvic lymphadenopathy, by size criteria. No aneurysmal dilation of the major abdominal arteries. There are moderate peripheral atherosclerotic vascular calcifications of the aorta and its major branches. Reproductive: The uterus is surgically absent. No large adnexal mass. Other: There is a tiny fat containing umbilical hernia. The soft tissues and abdominal wall are otherwise unremarkable. Musculoskeletal: No suspicious osseous lesions. There are mild - moderate multilevel degenerative changes in the visualized spine. IMPRESSION: 1. No acute inflammatory process identified within the abdomen or pelvis. 2. There is moderate to severe intra and extrahepatic bile duct dilation up to the level of Ampulla of Vater, where there is gradual tapering. No discrete obstructing mass seen. There are  subtle faintly calcified 2-3 mm calcifications in the region of Ampulla of Vater, which are likely in the pancreatic parenchyma. No discrete choledocholithiasis seen. If there is clinical concern for biliary obstruction, consider further evaluation with ERCP versus MRCP. 3. Multiple other nonacute observations, as described above. Aortic Atherosclerosis (ICD10-I70.0). Electronically Signed   By: Beula Brunswick M.D.   On: 09/29/2023 13:41    Pending Labs Unresulted Labs (From admission, onward)     Start     Ordered   09/30/23 0500  Hemoglobin  A1c  Tomorrow morning,   R       Comments: To assess prior glycemic control    09/29/23 1435   09/30/23 0500  CBC  Tomorrow morning,   R        09/29/23 1435   09/30/23 0500  Comprehensive metabolic panel  Tomorrow morning,   R        09/29/23 1435   09/30/23 0500  Magnesium   Tomorrow morning,   R        09/29/23 1435   09/29/23 1048  Hepatitis panel, acute  Once,   URGENT        09/29/23 1048            Vitals/Pain Today's Vitals   09/29/23 1025 09/29/23 1031 09/29/23 1350  BP:  (!) 158/69 (!) 191/65  Pulse:  (!) 111 (!) 105  Resp:  18 14  Temp:  98.6 F (37 C) 98.4 F (36.9 C)  TempSrc:  Oral Oral  SpO2:  97% 95%  Weight: 73.9 kg    Height: 5' 1.5" (1.562 m)    PainSc: 0-No pain      Isolation Precautions No active isolations  Medications Medications  insulin  aspart (novoLOG ) injection 0-15 Units (has no administration in time range)  insulin  aspart (novoLOG ) injection 0-5 Units (has no administration in time range)  enoxaparin  (LOVENOX ) injection 40 mg (has no administration in time range)  0.9 %  sodium chloride  infusion (has no administration in time range)  oxyCODONE  (Oxy IR/ROXICODONE ) immediate release tablet 5 mg (has no administration in time range)  morphine  (PF) 2 MG/ML injection 2 mg (has no administration in time range)  ondansetron  (ZOFRAN ) tablet 4 mg (has no administration in time range)    Or  ondansetron  (ZOFRAN ) injection 4 mg (has no administration in time range)  hydrALAZINE  (APRESOLINE ) injection 5 mg (has no administration in time range)  prochlorperazine  (COMPAZINE ) injection 10 mg (10 mg Intravenous Given 09/29/23 1119)  iohexol  (OMNIPAQUE ) 300 MG/ML solution 100 mL (100 mLs Intravenous Contrast Given 09/29/23 1314)    Mobilitywalks with device

## 2023-09-29 NOTE — ED Triage Notes (Signed)
 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

## 2023-09-29 NOTE — H&P (Signed)
 History and Physical    Pamela [name] WGN:[id number] DOB: [birth year] DOA: 09/29/2023  PCP: [name], MD   Patient coming from: Home  I have personally briefly reviewed patient's old medical records in [facility]  Chief Complaint: Sent from cancer center for abnormal LFTs  HPI: Pamela [name] is a 73 y.o. 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  Patient was supposed to have today but was held because of abnormal labs.  She was given IV fluids in the cancer center.  2 weeks ago, her immunosuppression regimen was changed.  She has not taken Tylenol  for 2 weeks due to elevated liver enzymes at her last appointment.  She was taking Tylenol  daily for 2 weeks prior to stopping.  No recent NSAID use.  She has noticed some jaundice.  Complains of intermittent nausea with vomiting and diarrhea for the last few days.  No vomiting, chills, night sweats, worsening shortness of breath, cough, chest pain, diarrhea, dysuria, loss of consciousness, seizures.  ED Course: Labs from today had shown AST of 220, ALT of 421, total bilirubin of 4.9 and alkaline phosphatase of 267 with lipase of 19.  CT of abdomen and pelvis with contrast showed no acute inflammatory process with moderate to severe intra and extrahepatic bile duct dilation up to the level of ampulla where, where there is gradual tapering of; no distinct obstructing mass seen with no discrete choledocholithiasis. Hospitalist service was called to evaluate  the patient.  Review of Systems: As per HPI otherwise all other systems were reviewed and are negative.   Past Medical History:  Diagnosis Date   Anemia    Anxiety    CHF (congestive heart failure) (HCC)    Chronic kidney disease    COPD (chronic obstructive pulmonary disease) (HCC)    Coronary artery disease    Diabetes mellitus without complication (HCC) 10-29-12   Headache    Hypertension    PONV (postoperative nausea and vomiting)    Shortness of breath dyspnea     Past Surgical History:  Procedure Laterality Date   ABDOMINAL HYSTERECTOMY     ANAL FISSURE REPAIR     APPENDECTOMY     '74- open with gallbladder   AV FISTULA PLACEMENT Left 01/31/2015   Procedure: ARTERIOVENOUS FISTULA CREATION-LEFT BRACHIO-CEPHALIC ;  Surgeon: Arvil Lauber, MD;  Location: [facility] OR;  Service: Vascular;  Laterality: Left;   AV FISTULA PLACEMENT Left 04/04/2015   Procedure: INSERTION OF ARTERIOVENOUS (AV) GORE-TEX GRAFT ARM;  Surgeon: Richrd Char, MD;  Location: [facility] OR;  Service: Vascular;  Laterality: Left;   BREAST EXCISIONAL BIOPSY Left    BREAST SURGERY     cyst removed   BRONCHIAL BIOPSY  08/18/2023   Procedure: BRONCHOSCOPY, WITH BIOPSY;  Surgeon: Denson Flake, MD;  Location[facility] ENDOSCOPY;  Service: Pulmonary;;   BRONCHIAL BRUSHINGS  08/18/2023   Procedure: BRONCHOSCOPY, WITH BRUSH BIOPSY;  Surgeon: Denson Flake, MD;  Location: [facility] ENDOSCOPY;  Service: Pulmonary;;   CARDIAC CATHETERIZATION     1 coronary stent placed   CHOLECYSTECTOMY     '74-open  COLONOSCOPY WITH PROPOFOL  N/A 11/17/2012   Procedure: COLONOSCOPY WITH PROPOFOL ;  Surgeon: Garrett Kallman, MD;  Location: [facility] ENDOSCOPY;  Service: Endoscopy;  Laterality: N/A;   CORONARY STENT PLACEMENT     ELBOW SURGERY Right    tendon surgery   ESOPHAGOGASTRODUODENOSCOPY (EGD) WITH PROPOFOL  N/A 11/17/2012   Procedure: ESOPHAGOGASTRODUODENOSCOPY (EGD) WITH PROPOFOL ;  Surgeon: Garrett Kallman, MD;  Location: [facility] ENDOSCOPY;  Service:  Endoscopy;  Laterality: N/A;   GANGLION CYST EXCISION Bilateral 10/29/2012   wrist   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   LEFT HEART CATHETERIZATION WITH CORONARY ANGIOGRAM N/A 04/14/2014   Procedure: LEFT HEART CATHETERIZATION WITH CORONARY ANGIOGRAM;  Surgeon: Mickiel Albany, MD;  Location: [facility] CATH LAB;  Service: Cardiovascular;  Laterality: N/A;   PERIPHERAL VASCULAR CATHETERIZATION N/A 03/09/2015   Procedure: Fistulagram;  Surgeon: Arvil Lauber, MD;  Location: [facility] INVASIVE CV LAB;  Service: Cardiovascular;  Laterality: N/A;   TEE WITHOUT CARDIOVERSION N/A 01/20/2015   Procedure: TRANSESOPHAGEAL ECHOCARDIOGRAM (TEE);  Surgeon: Elmyra Haggard, MD;  Location: [facility] ENDOSCOPY;  Service: Cardiovascular;  Laterality: N/A;   TUBAL LIGATION     VIDEO BRONCHOSCOPY  08/18/2023   Procedure: BRONCHOSCOPY, WITH FLUOROSCOPY;  Surgeon: Denson Flake, MD;  Location: [facility] ENDOSCOPY;  Service: Pulmonary;;     reports that she quit smoking about 30 years ago. Her smoking use included cigarettes. She has never used smokeless tobacco. She reports that she does not drink alcohol and does not use drugs.  No Known Allergies  Family History  Problem Relation Age of Onset   Diabetes Father    Cancer Father    Hypertension Father    Heart attack Mother    Hypertension Mother    Diabetes Sister    Hypertension Sister    Cancer Sister    Hypertension Sister    Breast cancer Sister     Prior to Admission medications   Medication Sig Start Date End Date Taking? Authorizing Provider  Accu-Chek Softclix Lancets lancets DX: E11.21 as directed 12/06/16   [provider]  acetaminophen  (TYLENOL ) 650 MG CR tablet Take by mouth.    [provider]  albuterol  (VENTOLIN  HFA) 108 (90 Base) MCG/ACT inhaler 1 puff as needed.    [provider]  amLODipine  (NORVASC ) 10 MG tablet Take 10 mg by mouth daily. 03/04/17   [provider]  aspirin  EC 81 MG tablet Take 81 mg by mouth daily.     [provider]  atorvastatin  (LIPITOR) 10 MG tablet Take 1 tablet (10 mg total) by mouth daily. 06/08/20   Arty Binning, MD  Blood Glucose Calibration (ACCU-CHEK AVIVA) SOLN DX: E11.21 as directed 12/06/16   [provider]  carvedilol  (COREG ) 12.5 MG tablet Take 1 tablet (12.5 mg total) by mouth 2 (two) times daily. 06/08/20   Arty Binning, MD  COVID-19 mRNA bivalent vaccine, Pfizer, (PFIZER COVID-19 VAC BIVALENT) injection Inject into the muscle. 01/30/21   Liane Redman, MD  COVID-19 mRNA Vac-TriS, Pfizer, (PFIZER-BIONT COVID-19 VAC-TRIS) SUSP injection Inject into the muscle. 10/02/20   Liane Redman, MD  glimepiride  (AMARYL ) 2 MG tablet Take 2 mg by mouth daily.    [provider]  glucose blood (ACCU-CHEK AVIVA PLUS) test strip use as directed to check  blood sugar 4 times daily    [provider]  influenza vaccine adjuvanted (FLUAD) 0.5 ML injection Inject into the muscle. 01/30/21   Liane Redman, MD  insulin  glargine (LANTUS ) 100 UNIT/ML injection Inject 10  Units into the skin at bedtime.    [provider]  insulin  glargine (LANTUS ) 100 UNIT/ML injection Inject 30 Units into the skin every morning.    [provider]  insulin  lispro (HUMALOG) 100 UNIT/ML KiwkPen Inject into the skin. Take 4 units in the skin in the a.m Take 6 units in the skin at lunch Take 8 units in the skin at supper 01/22/18   [provider]  latanoprost  (XALATAN ) 0.005 % ophthalmic solution Place 1 drop into both eyes at bedtime.    [provider]  mycophenolate (CELLCEPT) 250 MG capsule Take 750 mg by mouth.  Patient not taking: Reported on 09/29/2023 02/03/18   [provider]  nitroGLYCERIN  (NITROSTAT ) 0.4 MG SL tablet Place 0.4 mg under the tongue every 5 (five) minutes as needed for chest pain.    [provider]  ondansetron  (ZOFRAN ) 8 MG tablet Take 1 tablet (8 mg total) by mouth every 8 (eight) hours as needed for  nausea or vomiting. Start on the third day after chemotherapy. 09/09/23   Marlene Simas, MD  oxyCODONE -acetaminophen  (PERCOCET/ROXICET) 5-325 MG tablet Take 1 tablet by mouth every 6 (six) hours as needed for severe pain (pain score 7-10). 09/22/23   Retta Caster, MD  predniSONE  (DELTASONE ) 5 MG tablet Take 5 mg by mouth daily with breakfast.  12/22/17   [provider]  prochlorperazine  (COMPAZINE ) 10 MG tablet Take 1 tablet (10 mg total) by mouth every 6 (six) hours as needed for nausea or vomiting. 09/09/23   Marlene Simas, MD  sirolimus (RAPAMUNE) 1 MG tablet Take 2 mg by mouth daily. 09/16/23   [provider]  Tacrolimus  1 MG TB24 Take 1 mg by mouth daily. 01/08/18   [provider]  tacrolimus  ER (ENVARSUS  XR) 1 MG TB24 Take 1 mg by mouth daily. 08/10/20   [provider]  umeclidinium-vilanterol (ANORO ELLIPTA) 62.5-25 MCG/INH AEPB Inhale 1 puff into the lungs daily.  01/07/18   [provider]    Physical Exam: Vitals:   09/29/23 1025 09/29/23 1031 09/29/23 1350  BP:  (!) 158/69 (!) 191/65  Pulse:  (!) 111 (!) 105  Resp:  18 14  Temp:  98.6 F (37 C) 98.4 F (36.9 C)  TempSrc:  Oral Oral  SpO2:  97% 95%  Weight: 73.9 kg    Height: 5' 1.5" (1.562 m)      Constitutional: NAD, calm, comfortable Vitals:   09/29/23 1025 09/29/23 1031 09/29/23 1350  BP:  (!) 158/69 (!) 191/65  Pulse:  (!) 111 (!) 105  Resp:  18 14  Temp:  98.6 F (37 C) 98.4 F (36.9 C)  TempSrc:  Oral Oral  SpO2:  97% 95%  Weight: 73.9 kg    Height: 5' 1.5" (1.562 m)     Eyes: PERRL.  Looks icteric ENMT: Mucous membranes are moist. Posterior pharynx clear of any exudate or lesions. Neck: normal, supple, no masses, no thyromegaly Respiratory: bilateral decreased breath sounds at bases, no wheezing, no crackles. Normal respiratory effort. No accessory muscle use.  Cardiovascular: S1 S2 positive, tachycardic.  No extremity edema. 2+ pedal pulses.  Abdomen: no  tenderness, no masses palpated. No hepatosplenomegaly. Bowel sounds positive.  Musculoskeletal: no clubbing / cyanosis. No joint deformity upper and lower extremities.  Skin: no rashes, lesions, ulcers. No induration Neurologic: CN 2-12 grossly intact. Moving extremities. No focal neurologic deficits.  Psychiatric: Normal judgment and insight. Alert and oriented x 3. Normal mood.    Labs  on Admission: I have personally reviewed following labs and imaging studies  CBC: Recent Labs  Lab 09/29/23 0731  WBC 7.6  NEUTROABS 6.8  HGB 14.4  HCT 42.5  MCV 83.8  PLT 113*   Basic Metabolic Panel: Recent Labs  Lab 09/29/23 0731  NA 129*  K 3.8  CL 94*  CO2 23  GLUCOSE 174*  BUN 15  CREATININE 0.99  CALCIUM  8.6*   GFR: Estimated Creatinine Clearance: 47.1 mL/min (by C-G formula based on SCr of 0.99 mg/dL). Liver Function Tests: Recent Labs  Lab 09/29/23 0731  AST 220*  ALT 421*  ALKPHOS 267*  BILITOT 4.9*  PROT 6.7  ALBUMIN 3.8   Recent Labs  Lab 09/29/23 1113  LIPASE 19   No results for input(s): "AMMONIA" in the last 168 hours. Coagulation Profile: No results for input(s): "INR", "PROTIME" in the last 168 hours. Cardiac Enzymes: No results for input(s): "CKTOTAL", "CKMB", "CKMBINDEX", "TROPONINI" in the last 168 hours. BNP (last 3 results) No results for input(s): "PROBNP" in the last 8760 hours. HbA1C: No results for input(s): "HGBA1C" in the last 72 hours. CBG: No results for input(s): "GLUCAP" in the last 168 hours. Lipid Profile: No results for input(s): "CHOL", "HDL", "LDLCALC", "TRIG", "CHOLHDL", "LDLDIRECT" in the last 72 hours. Thyroid Function Tests: No results for input(s): "TSH", "T4TOTAL", "FREET4", "T3FREE", "THYROIDAB" in the last 72 hours. Anemia Panel: No results for input(s): "VITAMINB12", "FOLATE", "FERRITIN", "TIBC", "IRON ", "RETICCTPCT" in the last 72 hours. Urine analysis:    Component Value Date/Time   COLORURINE YELLOW 01/21/2015 0900    APPEARANCEUR CLOUDY (A) 01/21/2015 0900   LABSPEC 1.007 01/21/2015 0900   PHURINE 5.5 01/21/2015 0900   GLUCOSEU NEGATIVE 01/21/2015 0900   HGBUR MODERATE (A) 01/21/2015 0900   BILIRUBINUR NEGATIVE 01/21/2015 0900   KETONESUR NEGATIVE 01/21/2015 0900   PROTEINUR 100 (A) 01/21/2015 0900   UROBILINOGEN 0.2 01/21/2015 0900   NITRITE NEGATIVE 01/21/2015 0900   LEUKOCYTESUR TRACE (A) 01/21/2015 0900    Radiological Exams on Admission: CT ABDOMEN PELVIS W CONTRAST Result Date: 09/29/2023 CLINICAL DATA:  Abdominal pain, acute, nonlocalized. * Tracking Code: BO * EXAM: CT ABDOMEN AND PELVIS WITH CONTRAST TECHNIQUE: Multidetector CT imaging of the abdomen and pelvis was performed using the standard protocol following bolus administration of intravenous contrast. RADIATION DOSE REDUCTION: This exam was performed according to the departmental dose-optimization program which includes automated exposure control, adjustment of the mA and/or kV according to patient size and/or use of iterative reconstruction technique. CONTRAST:  OMNIPAQUE  IOHEXOL  300 MG/ML  SOLN COMPARISON:  CT scan abdomen and pelvis from 02/01/2016 and PET-CT scan from 09/04/2023. FINDINGS: Lower chest: The lung bases are clear. No pleural effusion. The heart is normal in size. No pericardial effusion. Hepatobiliary: The liver is normal in size. Non-cirrhotic configuration. No suspicious mass. These is mild diffuse hepatic steatosis. There is moderate to severe intra and extrahepatic bile duct dilation up to the level of ampulla of Vater, where there is gradual tapering. No discrete obstructing mass seen. There are subtle faintly calcified 2, 2-3 mm calcifications in the region of ampulla of Vater, which are likely in the pancreatic parenchyma. No discrete choledocholithiasis seen. Gallbladder is surgically absent. Pancreas: Small/atrophic pancreas. There are multiple dystrophic calcifications mainly in the pancreatic head/uncinate  process region, likely sequela of chronic pancreatitis. No suspicious mass or peripancreatic fat stranding. Main pancreatic duct is not dilated. Spleen: Within normal limits. No focal lesion. Adrenals/Urinary Tract: Stable bilateral adrenal glands. Redemonstration of small/atrophic bilateral  native kidneys. There is a mildly complex cyst arising from the left kidney lower pole measuring at least 1.2 x 1.5 cm, exhibiting single 1-2 mm sized calcified septum. There is a smaller simple cyst arising from the right kidney upper pole, posteriorly. No hydroureteronephrosis on either side. There are several sub 5 mm nonobstructing calculi in bilateral kidneys. No ureterolithiasis. 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 Unremarkable urinary bladder. Stomach/Bowel: There is small sliding hiatal hernia. No disproportionate dilation of the small or large bowel loops. No evidence of abnormal bowel wall thickening or inflammatory changes. The appendix was not visualized; however there is no acute inflammatory process in the right lower quadrant. There are multiple diverticula mainly in the sigmoid colon, without imaging signs of diverticulitis. Vascular/Lymphatic: No ascites or pneumoperitoneum. No abdominal or pelvic lymphadenopathy, by size criteria. No aneurysmal dilation of the major abdominal arteries. There are moderate peripheral atherosclerotic vascular calcifications of the aorta and its major branches. Reproductive: The uterus is surgically absent. No large adnexal mass. Other: There is a tiny fat containing umbilical hernia. The soft tissues and abdominal wall are otherwise unremarkable. Musculoskeletal: No suspicious osseous lesions. There are mild - moderate multilevel degenerative changes in the visualized spine. IMPRESSION: 1. No acute inflammatory process identified within the abdomen or pelvis. 2. There is moderate to severe intra and extrahepatic bile duct dilation up to  the level of Ampulla of Vater, where there is gradual tapering. No discrete obstructing mass seen. There are subtle faintly calcified 2-3 mm calcifications in the region of Ampulla of Vater, which are likely in the pancreatic parenchyma. No discrete choledocholithiasis seen. If there is clinical concern for biliary obstruction, consider further evaluation with ERCP versus MRCP. 3. Multiple other nonacute observations, as described above. Aortic Atherosclerosis (ICD10-I70.0). Electronically Signed   By: Beula Brunswick M.D.   On: 09/29/2023 13:41    Assessment/Plan  Elevated LFTs including elevated bilirubin with severe intra and extrahepatic biliary dilatation - Presented with elevated LFTs including elevated bilirubin and imaging as above.  No discrete choledocholithiasis or obstructing mass seen. - ED provider spoke to Dr. Schooler/gastroenterology who recommended MRCP/MRI of abdomen and he will see the patient in consultation - Monitor LFTs.  Follow GI recommendations. - Clear liquid diet for now  Nausea, vomiting, diarrhea - Possibly chemotherapy-induced.  Use antibiotics as needed.  IV fluids.  Clear liquid diet for now.  Hyponatremia Dehydration -Possibly from poor oral intake.  Continue IV fluids.  Repeat a.m. labs  Thrombocytopenia -Questionable cause.  No signs of bleeding.  Monitor.  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  I have added Dr. Marguerita Shih to the care teams. -Outpatient follow-up with oncology  History of renal transplantation currently on immunosuppressants History of end-stage renal disease requiring hemodialysis in the past - Renal function currently stable.  Will resume home immunosuppression regimen once home medications are verified.  Outpatient follow-up with transplant team  Diabetes  mellitus type 2 - CBGs with SSI.  Amaryl  on hold.  Hypertension - Monitor blood pressure.  Use IV antihypertensives for now.  Resume home regimen once verified and once able to tolerate orally  Hyperlipidemia - Hold statin because of elevated LFTs  COPD -Stable.  Use bronchodilators if needed  DVT prophylaxis: Lovenox  Code Status: Full Family Communication: husband at bedside Disposition Plan: Home in 1 to 2 days  once clinically improved Consults called: EDP called GI/Dr. Honey Lusty Admission status: Observation/telemetry  Severity of Illness: The appropriate patient status for this patient is OBSERVATION. Observation status is judged to be reasonable and necessary in order to provide the required intensity of service to ensure the patient's safety. The patient's presenting symptoms, physical exam findings, and initial radiographic and laboratory data in the context of their medical condition is felt to place them at decreased risk for further clinical deterioration. Furthermore, it is anticipated that the patient will be medically stable for discharge from the hospital within 2 midnights of admission.     Audria Leather MD [facility]  09/29/2023, 2:36 PM

## 2023-09-29 NOTE — Progress Notes (Addendum)
 Symptom Management Consult Note [facility]    Patient Care Team: [name], MD as PCP - General (Internal Medicine) Arty Binning, MD (Inactive) as PCP - Cardiology (Cardiology) Rosalita Combe, RN as Oncology Nurse Navigator    Name / MRN / DOB: Pamela [name]  [id number]  07-20-1950   Date of visit: 09/29/2023   Chief Complaint/Reason for visit: abnormal labs   Current Therapy: Concurrent chemoradiation with carboplatin  for an AUC of 2 paclitaxel  45 mg/m   Last treatment:  Day 1   Cycle 2 on 09/23/23    ASSESSMENT AND PLAN Patient is a 73 y.o. female with oncologic history of  stage IIIa (T3, N1, M0) non-small cell lung cancer, squamous cell carcinoma with a focal area of small followed by Dr. Marguerita Shih.  I have viewed most recent oncology note and lab work.  #Stage IIIa (T3, N1, M0) non-small cell lung cancer, squamous cell carcinoma with a focal area of small  - Next appointment with oncologist is 10/03/23 - Had radiation this morning. - Chemotherapy held today because of abnormal labs   #Abnormal labs - T. Bili 4.9, AST 220, ALT 421, alk phos 267, sodium 129. - Patient is jaundice, no abdominal tenderness. Vitals show tachycardia 115. - Patient started on 1L NS in clinic - Per HPI patient had change to immunosuppressant therapy for kidney transplant recently which coincides with first abnormal CMP on 09/22/23. Patient has not taken tylenol  in at least 1 week. - Patient agreeable to urgent work up in the emergency department for further evaluation. Oncologist agrees with plan. Report given to accepting ED RN.   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    Pamela [name] is a 73 y.o. female with oncologic history as above presenting to [facility] today with chief complaint of abnormal labs. Accompanied to clinic today by spouse who provides additional history.  She experiences diarrhea and vomiting, with diarrhea occurring four to five times per day and stools being completely liquid. She vomited five times and has not found relief with nausea medication. Symptoms started over the weekend. No fever, abdominal pain, or burning with urination. Her urine is light in color, and she has been able to consume small amounts of watermelon, applesauce, and cold fluids. She drank approximately two bottles of water  per day over the weekend. She last took zofran  last night.  She has a history of a kidney transplant and was previously on Cellcept, prednisone , and Envarsus . Two weeks ago, her immunosuppressant regimen was changed to include Rapamune, while continuing Envarsus  and prednisone . She has not taken Tylenol  for two weeks due to elevated liver enzymes at her last appointment. She recalls taking Tylenol  daily for two weeks prior to stopping. She is not currently taking any NSAIDs due to her kidney transplant.  She notes that  her skin may appear slightly jaundiced. No history of liver problems or alcohol consumption. A PET scan in April indicated chronic pancreatitis, but she has no history of pancreatic issues. She is scheduled to see her transplant team at [facility] in July, goes every 6 months. Transplant was 09/28/2017 per chart review.    ROS  All other systems are reviewed and are negative for acute change except as noted in the HPI.    No Known  Allergies   Past Medical History:  Diagnosis Date   Anemia    Anxiety    CHF (congestive heart failure) (HCC)    Chronic kidney disease    COPD (chronic obstructive pulmonary disease) (HCC)    Coronary artery disease    Diabetes mellitus without complication (HCC) 10-29-12   Headache    Hypertension    PONV (postoperative nausea and vomiting)    Shortness of breath dyspnea      Past Surgical History:  Procedure Laterality Date   ABDOMINAL HYSTERECTOMY     ANAL FISSURE REPAIR     APPENDECTOMY     '74- open with gallbladder   AV FISTULA PLACEMENT Left 01/31/2015   Procedure: ARTERIOVENOUS FISTULA CREATION-LEFT BRACHIO-CEPHALIC ;  Surgeon: Arvil Lauber, MD;  Location: [facility] OR;  Service: Vascular;  Laterality: Left;   AV FISTULA PLACEMENT Left 04/04/2015   Procedure: INSERTION OF ARTERIOVENOUS (AV) GORE-TEX GRAFT ARM;  Surgeon: Richrd Char, MD;  Location: [facility] OR;  Service: Vascular;  Laterality: Left;   BREAST EXCISIONAL BIOPSY Left    BREAST SURGERY     cyst removed   BRONCHIAL BIOPSY  08/18/2023   Procedure: BRONCHOSCOPY, WITH BIOPSY;  Surgeon: Denson Flake, MD;  Location: [facility] ENDOSCOPY;  Service: Pulmonary;;   BRONCHIAL BRUSHINGS  08/18/2023   Procedure: BRONCHOSCOPY, WITH BRUSH BIOPSY;  Surgeon: Denson Flake, MD;  Location: [facility] ENDOSCOPY;  Service: Pulmonary;;   CARDIAC CATHETERIZATION     1 coronary stent placed   CHOLECYSTECTOMY     '74-open   COLONOSCOPY WITH PROPOFOL  N/A 11/17/2012   Procedure: COLONOSCOPY WITH PROPOFOL ;  Surgeon: Garrett Kallman, MD;  Location: [facility] ENDOSCOPY;  Service: Endoscopy;  Laterality: N/A;   CORONARY STENT PLACEMENT     ELBOW SURGERY Right    tendon surgery   ESOPHAGOGASTRODUODENOSCOPY (EGD) WITH PROPOFOL  N/A 11/17/2012   Procedure: ESOPHAGOGASTRODUODENOSCOPY (EGD) WITH PROPOFOL ;  Surgeon: Garrett Kallman, MD;  Location: [facility] ENDOSCOPY;  Service: Endoscopy;  Laterality: N/A;   GANGLION CYST EXCISION Bilateral 10/29/2012   wrist   KIDNEY  TRANSPLANT  12/15/2017   LEFT HEART CATHETERIZATION WITH CORONARY ANGIOGRAM N/A 04/14/2014   Procedure: LEFT HEART CATHETERIZATION WITH CORONARY ANGIOGRAM;  Surgeon: Mickiel Albany, MD;  Location: [facility] CATH LAB;  Service: Cardiovascular;  Laterality: N/A;   PERIPHERAL VASCULAR CATHETERIZATION N/A 03/09/2015   Procedure: Fistulagram;  Surgeon: Arvil Lauber, MD;  Location: [facility] INVASIVE CV LAB;  Service: Cardiovascular;  Laterality: N/A;   TEE WITHOUT CARDIOVERSION N/A 01/20/2015   Procedure: TRANSESOPHAGEAL ECHOCARDIOGRAM (TEE);  Surgeon: Elmyra Haggard, MD;  Location: [facility] ENDOSCOPY;  Service: Cardiovascular;  Laterality: N/A;   TUBAL LIGATION     VIDEO BRONCHOSCOPY  08/18/2023   Procedure: BRONCHOSCOPY, WITH FLUOROSCOPY;  Surgeon: Denson Flake, MD;  Location: [facility] ENDOSCOPY;  Service: Pulmonary;;    Social History   Socioeconomic History   Marital status: Married    Spouse name: Not on file   Number of children: Not on file   Years of education: Not  on file   Highest education level: Not on file  Occupational History   Not on file  Tobacco Use   Smoking status: Former    Current packs/day: 0.00    Types: Cigarettes    Quit date: 10/29/1992    Years since quitting: 30.9   Smokeless tobacco: Never  Vaping Use   Vaping status: Never Used  Substance and Sexual Activity   Alcohol use: No    Alcohol/week: 0.0 standard drinks of alcohol   Drug use: No   Sexual activity: Not Currently  Other Topics Concern   Not on file  Social History Narrative   Not on file   Social Drivers of Health   Financial Resource Strain: Not on file  Food Insecurity: No Food Insecurity (09/08/2023)   Hunger Vital Sign    Worried About Running Out of Food in the Last Year: Never true    Ran Out of Food in the Last Year: Never true  Transportation Needs: No Transportation Needs (09/08/2023)   PRAPARE - Administrator, Civil Service (Medical): No    Lack of Transportation (Non-Medical): No   Physical Activity: Not on file  Stress: Not on file  Social Connections: Not on file  Intimate Partner Violence: Not At Risk (09/08/2023)   Humiliation, Afraid, Rape, and Kick questionnaire    Fear of Current or Ex-Partner: No    Emotionally Abused: No    Physically Abused: No    Sexually Abused: No    Family History  Problem Relation Age of Onset   Diabetes Father    Cancer Father    Hypertension Father    Heart attack Mother    Hypertension Mother    Diabetes Sister    Hypertension Sister    Cancer Sister    Hypertension Sister    Breast cancer Sister      Current Outpatient Medications:    Accu-Chek Softclix Lancets lancets, DX: E11.21 as directed, Disp: , Rfl:    acetaminophen  (TYLENOL ) 650 MG CR tablet, Take by mouth., Disp: , Rfl:    albuterol  (VENTOLIN  HFA) 108 (90 Base) MCG/ACT inhaler, 1 puff as needed., Disp: , Rfl:    amLODipine  (NORVASC ) 10 MG tablet, Take 10 mg by mouth daily., Disp: , Rfl:    aspirin  EC 81 MG tablet, Take 81 mg by mouth daily., Disp: , Rfl:    atorvastatin  (LIPITOR) 10 MG tablet, Take 1 tablet (10 mg total) by mouth daily., Disp: 90 tablet, Rfl: 3   Blood Glucose Calibration (ACCU-CHEK AVIVA) SOLN, DX: E11.21 as directed, Disp: , Rfl:    carvedilol  (COREG ) 12.5 MG tablet, Take 1 tablet (12.5 mg total) by mouth 2 (two) times daily., Disp: 180 tablet, Rfl: 3   COVID-19 mRNA bivalent vaccine, Pfizer, (PFIZER COVID-19 VAC BIVALENT) injection, Inject into the muscle., Disp: 0.3 mL, Rfl: 0   COVID-19 mRNA Vac-TriS, Pfizer, (PFIZER-BIONT COVID-19 VAC-TRIS) SUSP injection, Inject into the muscle., Disp: 0.3 mL, Rfl: 0   glimepiride  (AMARYL ) 2 MG tablet, Take 2 mg by mouth daily., Disp: , Rfl:    glucose blood (ACCU-CHEK AVIVA PLUS) test strip, use as directed to check  blood sugar 4 times daily, Disp: , Rfl:    influenza vaccine adjuvanted (FLUAD) 0.5 ML injection, Inject into the muscle., Disp: 0.5 mL, Rfl: 0   insulin  glargine (LANTUS ) 100 UNIT/ML  injection, Inject 10 Units into the skin at bedtime., Disp: , Rfl:    insulin  glargine (LANTUS ) 100 UNIT/ML injection, Inject 30 Units  into the skin every morning., Disp: , Rfl:    insulin  lispro (HUMALOG) 100 UNIT/ML KiwkPen, Inject into the skin. Take 4 units in the skin in the a.m Take 6 units in the skin at lunch Take 8 units in the skin at supper, Disp: , Rfl:    latanoprost  (XALATAN ) 0.005 % ophthalmic solution, Place 1 drop into both eyes at bedtime., Disp: , Rfl:    mycophenolate (CELLCEPT) 250 MG capsule, Take 750 mg by mouth.  (Patient not taking: Reported on 09/29/2023), Disp: , Rfl:    nitroGLYCERIN  (NITROSTAT ) 0.4 MG SL tablet, Place 0.4 mg under the tongue every 5 (five) minutes as needed for chest pain., Disp: , Rfl:    ondansetron  (ZOFRAN ) 8 MG tablet, Take 1 tablet (8 mg total) by mouth every 8 (eight) hours as needed for nausea or vomiting. Start on the third day after chemotherapy., Disp: 30 tablet, Rfl: 1   oxyCODONE -acetaminophen  (PERCOCET/ROXICET) 5-325 MG tablet, Take 1 tablet by mouth every 6 (six) hours as needed for severe pain (pain score 7-10)., Disp: 30 tablet, Rfl: 0   predniSONE  (DELTASONE ) 5 MG tablet, Take 5 mg by mouth daily with breakfast. , Disp: , Rfl:    prochlorperazine  (COMPAZINE ) 10 MG tablet, Take 1 tablet (10 mg total) by mouth every 6 (six) hours as needed for nausea or vomiting., Disp: 30 tablet, Rfl: 1   sirolimus (RAPAMUNE) 1 MG tablet, Take 2 mg by mouth daily., Disp: , Rfl:    Tacrolimus  1 MG TB24, Take 1 mg by mouth daily., Disp: , Rfl:    tacrolimus  ER (ENVARSUS  XR) 1 MG TB24, Take 1 mg by mouth daily., Disp: , Rfl:    umeclidinium-vilanterol (ANORO ELLIPTA) 62.5-25 MCG/INH AEPB, Inhale 1 puff into the lungs daily. , Disp: , Rfl:   PHYSICAL EXAM ECOG FS:1 - Symptomatic but completely ambulatory    Vitals:   09/29/23 0820  BP: (!) 148/57  Pulse: (!) 115  Resp: 18  Temp: 99.3 F (37.4 C)  TempSrc: Oral  SpO2: 95%   Physical Exam Vitals  reviewed.  Constitutional:      General: She is not in acute distress.    Appearance: She is ill-appearing.  HENT:     Head: Normocephalic.     Right Ear: External ear normal.     Left Ear: External ear normal.     Nose: Nose normal.     Mouth/Throat:     Mouth: Mucous membranes are dry.     Pharynx: Oropharynx is clear.  Eyes:     General: No scleral icterus.    Conjunctiva/sclera: Conjunctivae normal.  Neck:     Vascular: No JVD.  Cardiovascular:     Rate and Rhythm: Regular rhythm. Tachycardia present.     Pulses: Normal pulses.     Heart sounds: Normal heart sounds.  Pulmonary:     Comments: Lungs clear to auscultation in all fields. Symmetric chest rise. No wheezing, rales, or rhonchi. Abdominal:     General: There is no distension.     Palpations: Abdomen is soft.     Tenderness: There is no abdominal tenderness. There is no guarding.  Musculoskeletal:        General: Normal range of motion.     Cervical back: Normal range of motion.     Right lower leg: No edema.     Left lower leg: No edema.  Skin:    General: Skin is warm and dry.     Capillary Refill: Capillary  refill takes less than 2 seconds.     Coloration: Skin is jaundiced.  Neurological:     Mental Status: She is oriented to person, place, and time.     GCS: GCS eye subscore is 4. GCS verbal subscore is 5. GCS motor subscore is 6.     Comments: Fluent speech, no facial droop.  Psychiatric:        Behavior: Behavior normal.        LABORATORY DATA I have reviewed the data as listed    Latest Ref Rng & Units 09/29/2023    7:31 AM 09/22/2023    7:33 AM 09/15/2023    7:40 AM  CBC  WBC 4.0 - 10.5 K/uL 7.6  8.6  12.4   Hemoglobin 12.0 - 15.0 g/dL 16.1  09.6  04.5   Hematocrit 36.0 - 46.0 % 42.5  43.7  42.8   Platelets 150 - 400 K/uL 113  188  259         Latest Ref Rng & Units 09/29/2023    7:31 AM 09/22/2023    7:33 AM 09/15/2023    7:40 AM  CMP  Glucose 70 - 99 mg/dL 409  50  811   BUN 8 - 23  mg/dL 15  15  14    Creatinine 0.44 - 1.00 mg/dL 9.14  7.82  9.56   Sodium 135 - 145 mmol/L 129  140  136   Potassium 3.5 - 5.1 mmol/L 3.8  4.0  4.3   Chloride 98 - 111 mmol/L 94  103  102   CO2 22 - 32 mmol/L 23  28  27    Calcium  8.9 - 10.3 mg/dL 8.6  9.2  9.2   Total Protein 6.5 - 8.1 g/dL 6.7  7.0  6.8   Total Bilirubin 0.0 - 1.2 mg/dL 4.9  0.4  0.5   Alkaline Phos 38 - 126 U/L 267  134  98   AST 15 - 41 U/L 220  62  13   ALT 0 - 44 U/L 421  121  10        RADIOGRAPHIC STUDIES (from last 24 hours if applicable) I have personally reviewed the radiological images as listed and agreed with the findings in the report. No results found.      Visit Diagnosis: 1. 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 Elevated liver enzymes   3. Hyponatremia   4. Serum total bilirubin elevated   5. Nausea vomiting and diarrhea      No orders of the defined types were placed in this encounter.   All questions were answered. The patient knows to call the clinic with any problems, questions or concerns. No barriers to learning was detected.  A total of more than 40 minutes were spent on this encounter with face-to-face time and non-face-to-face time, including preparing to see the patient, ordering tests and/or medications, counseling the patient and coordination of care as outlined above.    Thank you for allowing me to participate in the care of this patient.    Arieana Somoza E  Walisiewicz, PA-C Department of Hematology/Oncology [facility] at [facility] Phone: [phone]  [phone]    09/29/2023 10:35 AM

## 2023-09-29 NOTE — ED Provider Notes (Signed)
 [place] EMERGENCY DEPARTMENT AT [facility] Provider Note   CSN: [id number] Arrival date & time: 09/29/23  1018     History  Chief Complaint  Patient presents with   Abdominal Pain   abnormal labs    Pamela [name] is a 73 y.o. 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  Patient's AST, ALT, and bili were elevated.  Patient endorses nausea which she states is not uncommon for her given her cancer treatment.  Patient denies vomiting, fever, chills, abdominal pain, diarrhea, shortness of breath, or chest pain.  Patient reports cholecystectomy previously.   Abdominal Pain Associated symptoms: nausea        Home Medications Prior to Admission medications   Medication Sig Start Date End Date Taking? Authorizing Provider  Accu-Chek Softclix Lancets lancets DX: E11.21 as directed 12/06/16   [provider]  acetaminophen  (TYLENOL ) 650 MG CR tablet Take by mouth.    [provider]  albuterol  (VENTOLIN  HFA) 108 (90 Base) MCG/ACT inhaler 1 puff as needed.    [provider]  amLODipine  (NORVASC ) 10 MG tablet Take 10 mg by mouth daily. 03/04/17   [provider]  aspirin  EC 81 MG tablet Take 81 mg by mouth daily.    [provider]  atorvastatin  (LIPITOR) 10 MG tablet Take 1 tablet (10 mg total) by mouth daily. 06/08/20   Arty Binning, MD  Blood Glucose Calibration (ACCU-CHEK AVIVA) SOLN DX: E11.21 as directed 12/06/16   [provider]  carvedilol  (COREG ) 12.5 MG tablet Take 1 tablet (12.5 mg total) by mouth 2 (two) times daily. 06/08/20   Arty Binning, MD  COVID-19 mRNA bivalent vaccine, Pfizer, (PFIZER COVID-19 VAC BIVALENT) injection Inject into the muscle. 01/30/21   Liane Redman, MD  COVID-19 mRNA Vac-TriS, Pfizer, (PFIZER-BIONT COVID-19 VAC-TRIS) SUSP injection Inject into the muscle.  10/02/20   Liane Redman, MD  glimepiride  (AMARYL ) 2 MG tablet Take 2 mg by mouth daily.    [provider]  glucose blood (ACCU-CHEK AVIVA PLUS) test strip use as directed to check  blood sugar 4 times daily    [provider]  influenza vaccine adjuvanted (FLUAD) 0.5 ML injection Inject into the muscle. 01/30/21   Liane Redman, MD  insulin  glargine (LANTUS ) 100 UNIT/ML injection Inject 10 Units into the skin at bedtime.    [provider]  insulin  glargine (LANTUS ) 100 UNIT/ML injection Inject 30 Units into the skin every morning.    [provider]  insulin  lispro (HUMALOG) 100 UNIT/ML KiwkPen Inject into the skin. Take 4 units in the skin in the a.m Take 6 units in the skin at lunch Take 8 units in the skin at supper 01/22/18   [provider]  latanoprost  (XALATAN ) 0.005 % ophthalmic solution Place 1 drop into both eyes at bedtime.    [provider]  mycophenolate (CELLCEPT) 250 MG capsule Take 750 mg by mouth.  Patient not taking: Reported on 09/29/2023 02/03/18   [provider]  nitroGLYCERIN  (NITROSTAT ) 0.4 MG SL tablet Place 0.4 mg under the tongue every 5 (five) minutes as needed for chest pain.    [provider]  ondansetron  (ZOFRAN ) 8 MG tablet Take 1 tablet (8 mg total) by mouth every 8 (eight) hours as needed for nausea or vomiting. Start on the third day after chemotherapy. 09/09/23   Marlene Simas, MD  oxyCODONE -acetaminophen  (  PERCOCET/ROXICET) 5-325 MG tablet Take 1 tablet by mouth every 6 (six) hours as needed for severe pain (pain score 7-10). 09/22/23   Retta Caster, MD  predniSONE  (DELTASONE ) 5 MG tablet Take 5 mg by mouth daily with breakfast.  12/22/17   [provider]  prochlorperazine  (COMPAZINE ) 10 MG tablet Take 1 tablet (10 mg total) by mouth every 6 (six) hours as needed for nausea or vomiting. 09/09/23   Marlene Simas, MD  sirolimus (RAPAMUNE) 1 MG tablet Take 2 mg by mouth daily.  09/16/23   [provider]  Tacrolimus  1 MG TB24 Take 1 mg by mouth daily. 01/08/18   [provider]  tacrolimus  ER (ENVARSUS  XR) 1 MG TB24 Take 1 mg by mouth daily. 08/10/20   [provider]  umeclidinium-vilanterol (ANORO ELLIPTA) 62.5-25 MCG/INH AEPB Inhale 1 puff into the lungs daily.  01/07/18   [provider]      Allergies    Patient has no known allergies.    Review of Systems   Review of Systems  Gastrointestinal:  Positive for abdominal pain and nausea.    Physical Exam Updated Vital Signs BP (!) 191/65 (BP Location: Left Arm)   Pulse (!) 105   Temp 98.4 F (36.9 C) (Oral)   Resp 14   Ht 5' 1.5" (1.562 m)   Wt 73.9 kg   SpO2 95%   BMI 30.30 kg/m  Physical Exam Vitals and nursing note reviewed.  Constitutional:      General: She is not in acute distress.    Appearance: She is well-developed. She is not toxic-appearing or diaphoretic.  HENT:     Head: Normocephalic and atraumatic.  Eyes:     Conjunctiva/sclera: Conjunctivae normal.     Comments: Possible mild jaundice of the eyes  Cardiovascular:     Rate and Rhythm: Normal rate and regular rhythm.     Heart sounds: Normal heart sounds.  Pulmonary:     Effort: Pulmonary effort is normal. No respiratory distress.     Breath sounds: Normal breath sounds.  Abdominal:     General: Bowel sounds are normal. There is no distension.     Palpations: Abdomen is soft.     Tenderness: There is no abdominal tenderness. Negative signs include Murphy's sign, Rovsing's sign and McBurney's sign.  Musculoskeletal:        General: No swelling.     Cervical back: Neck supple.  Skin:    General: Skin is warm and dry.     Capillary Refill: Capillary refill takes less than 2 seconds.     Comments: Patient mildly jaundiced on exam  Neurological:     General: No focal deficit present.     Mental Status: She is alert.  Psychiatric:        Mood and Affect: Mood normal.     ED Results /  Procedures / Treatments   Labs (all labs ordered are listed, but only abnormal results are displayed) Labs Reviewed  ACETAMINOPHEN  LEVEL - Abnormal; Notable for the following components:      Result Value   Acetaminophen  (Tylenol ), Serum <10 (*)    All other components within normal limits  LIPASE, BLOOD  HEPATITIS PANEL, ACUTE    EKG None  Radiology CT ABDOMEN PELVIS W CONTRAST Result Date: 09/29/2023 CLINICAL DATA:  Abdominal pain, acute, nonlocalized. * Tracking Code: BO * EXAM: CT ABDOMEN AND PELVIS WITH CONTRAST TECHNIQUE: Multidetector CT imaging of the abdomen and pelvis was performed using the standard protocol  following bolus administration of intravenous contrast. RADIATION DOSE REDUCTION: This exam was performed according to the departmental dose-optimization program which includes automated exposure control, adjustment of the mA and/or kV according to patient size and/or use of iterative reconstruction technique. CONTRAST:  OMNIPAQUE  IOHEXOL  300 MG/ML  SOLN COMPARISON:  CT scan abdomen and pelvis from 02/01/2016 and PET-CT scan from 09/04/2023. FINDINGS: Lower chest: The lung bases are clear. No pleural effusion. The heart is normal in size. No pericardial effusion. Hepatobiliary: The liver is normal in size. Non-cirrhotic configuration. No suspicious mass. These is mild diffuse hepatic steatosis. There is moderate to severe intra and extrahepatic bile duct dilation up to the level of ampulla of Vater, where there is gradual tapering. No discrete obstructing mass seen. There are subtle faintly calcified 2, 2-3 mm calcifications in the region of ampulla of Vater, which are likely in the pancreatic parenchyma. No discrete choledocholithiasis seen. Gallbladder is surgically absent. Pancreas: Small/atrophic pancreas. There are multiple dystrophic calcifications mainly in the pancreatic head/uncinate process region, likely sequela of chronic pancreatitis. No suspicious mass or  peripancreatic fat stranding. Main pancreatic duct is not dilated. Spleen: Within normal limits. No focal lesion. Adrenals/Urinary Tract: Stable bilateral adrenal glands. Redemonstration of small/atrophic bilateral native kidneys. There is a mildly complex cyst arising from the left kidney lower pole measuring at least 1.2 x 1.5 cm, exhibiting single 1-2 mm sized calcified septum. There is a smaller simple cyst arising from the right kidney upper pole, posteriorly. No hydroureteronephrosis on either side. There are several sub 5 mm nonobstructing calculi in bilateral kidneys. No ureterolithiasis. 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 Unremarkable urinary bladder. Stomach/Bowel: There is small sliding hiatal hernia. No disproportionate dilation of the small or large bowel loops. No evidence of abnormal bowel wall thickening or inflammatory changes. The appendix was not visualized; however there is no acute inflammatory process in the right lower quadrant. There are multiple diverticula mainly in the sigmoid colon, without imaging signs of diverticulitis. Vascular/Lymphatic: No ascites or pneumoperitoneum. No abdominal or pelvic lymphadenopathy, by size criteria. No aneurysmal dilation of the major abdominal arteries. There are moderate peripheral atherosclerotic vascular calcifications of the aorta and its major branches. Reproductive: The uterus is surgically absent. No large adnexal mass. Other: There is a tiny fat containing umbilical hernia. The soft tissues and abdominal wall are otherwise unremarkable. Musculoskeletal: No suspicious osseous lesions. There are mild - moderate multilevel degenerative changes in the visualized spine. IMPRESSION: 1. No acute inflammatory process identified within the abdomen or pelvis. 2. There is moderate to severe intra and extrahepatic bile duct dilation up to the level of Ampulla of Vater, where there is gradual tapering. No discrete  obstructing mass seen. There are subtle faintly calcified 2-3 mm calcifications in the region of Ampulla of Vater, which are likely in the pancreatic parenchyma. No discrete choledocholithiasis seen. If there is clinical concern for biliary obstruction, consider further evaluation with ERCP versus MRCP. 3. Multiple other nonacute observations, as described above. Aortic Atherosclerosis (ICD10-I70.0). Electronically Signed   By: Beula Brunswick M.D.   On: 09/29/2023 13:41    Procedures Procedures    Medications Ordered in ED Medications  prochlorperazine  (COMPAZINE ) injection 10 mg (10 mg Intravenous Given 09/29/23 1119)  iohexol  (OMNIPAQUE ) 300 MG/ML solution 100 mL (100 mLs Intravenous Contrast Given 09/29/23 1314)    ED Course/ Medical Decision Making/ A&P  Medical Decision Making Amount and/or Complexity of Data Reviewed Labs: ordered. Radiology: ordered.  Risk Prescription drug management.   This patient presents to the ED for concern of abnormal labs, this involves an extensive number of treatment options, and is a complaint that carries with it a high risk of complications and morbidity.  The differential diagnosis includes choledocholithiasis, acute cholecystitis, pancreatitis, acetaminophen  toxicity   Co morbidities that complicate the patient evaluation  Kidney transplant, lung cancer   Additional history obtained:  Additional history obtained from Husband External records from outside source obtained and reviewed including cancer center progress notes   Lab Tests:  I Ordered, and personally interpreted labs.  The pertinent results include: Acetaminophen  negative, lipase 19   Imaging Studies ordered:  I ordered imaging studies including CT abdomen pelvis with contrast I independently visualized and interpreted imaging which showed 1. No acute inflammatory process identified within the abdomen or pelvis. There is moderate to severe  intra and extrahepatic bile duct dilation up to the level of Ampulla of Vater, where there is gradual tapering. No discrete obstructing mass seen. There are subtle faintly calcified 2-3 mm calcifications in the region of Ampulla of Vater, which are likely in the pancreatic parenchyma. No discrete choledocholithiasis seen. If there is clinical concern for biliary obstruction, consider further evaluation with ERCP versus MRCP. I agree with the radiologist interpretation   Problem List / ED Course / Critical interventions / Medication management I ordered medication including Compazine  for nausea, IVF for mild hyponatremia Reevaluation of the patient after these medicines showed that the patient improved I have reviewed the patients home medicines and have made adjustments as needed   Consultations Obtained:  I requested consultation with the gastroenterology, Dr. Honey Lusty,  and discussed lab and imaging findings as well as pertinent plan - they recommend: Admission and they will round on the patient. Consultation with hospitalist, Dr. Glenn Lange   Test / Admission - Considered:  Admit for transaminitis and mild hyponatremia        Final Clinical Impression(s) / ED Diagnoses Final diagnoses:  Transaminitis  Hyponatremia    Rx / DC Orders ED Discharge Orders     None         Carie Charity, PA-C 09/29/23 1427    Iva Mariner, MD 09/29/23 1651

## 2023-09-29 NOTE — Progress Notes (Signed)
 CRITICAL VALUE STICKER  CRITICAL VALUE: Total  Bilirubin 4.9, AST 220, ALT 421  RECEIVER (on-site recipient of call): Odilia Bennett   DATE & TIME NOTIFIED: 09/29/2023 @ 0839  MESSENGER (representative from lab): Heather   MD NOTIFIED: Dr. Marguerita Shih   TIME OF NOTIFICATION: 4132  RESPONSE: No treatment today, 1 L fluids today.

## 2023-09-29 NOTE — ED Notes (Signed)
 Patient transported to MRI

## 2023-09-29 NOTE — Consult Note (Signed)
 Referring Provider: Dr. Maury Space Primary Care Physician:  [name], MD Primary Gastroenterologist:  Dr. Lincoln Renshaw  Reason for Consultation:  Elevated LFTs  HPI: Pamela [name] is a 73 y.o. female with recent diagnosis of lung cancer who is s/p 2 treatments of chemotherapy when she started having profuse vomiting and nonbloody diarrhea this past weekend. Denies abdominal pain/F/C. S/P cholecystectomy. Normal LFTs on 09/15/23 and mild elevation on 5/12: TB 0.4, ALP 134, AST 62, ALT 121. On admit TB 4.9, ALP 267, AST 220, ALT 421. CT showed moderate to severe intrahepatic and extrahepatic bile duct dilation without a definite stone and no obstructing mass seen. MRCP pending. Husband at bedside.  Past Medical History:  Diagnosis Date   Anemia    Anxiety    CHF (congestive heart failure) (HCC)    Chronic kidney disease    COPD (chronic obstructive pulmonary disease) (HCC)    Coronary artery disease    Diabetes mellitus without complication (HCC) 10-29-12   Headache    Hypertension    PONV (postoperative nausea and vomiting)    Shortness of breath dyspnea     Past Surgical History:  Procedure Laterality Date   ABDOMINAL HYSTERECTOMY     ANAL FISSURE REPAIR     APPENDECTOMY     '74- open with gallbladder   AV FISTULA PLACEMENT Left 01/31/2015   Procedure: ARTERIOVENOUS FISTULA CREATION-LEFT BRACHIO-CEPHALIC ;  Surgeon: Arvil Lauber, MD;  Location: [facility] OR;  Service: Vascular;  Laterality: Left;   AV FISTULA PLACEMENT Left 04/04/2015   Procedure: INSERTION OF ARTERIOVENOUS (AV) GORE-TEX GRAFT ARM;  Surgeon: Richrd Char, MD;  Location: [facility] OR;  Service: Vascular;  Laterality: Left;   BREAST EXCISIONAL BIOPSY Left    BREAST SURGERY     cyst removed   BRONCHIAL BIOPSY  08/18/2023   Procedure: BRONCHOSCOPY, WITH BIOPSY;  Surgeon: Denson Flake, MD;  Location: [facility] ENDOSCOPY;  Service: Pulmonary;;   BRONCHIAL BRUSHINGS  08/18/2023   Procedure: BRONCHOSCOPY, WITH BRUSH BIOPSY;  Surgeon: Denson Flake, MD;  Location: [facility] ENDOSCOPY;  Service: Pulmonary;;   CARDIAC CATHETERIZATION     1 coronary stent placed   CHOLECYSTECTOMY     '74-open   COLONOSCOPY WITH PROPOFOL  N/A 11/17/2012   Procedure: COLONOSCOPY WITH PROPOFOL ;  Surgeon: Garrett Kallman, MD;  Location: [facility] ENDOSCOPY;  Service: Endoscopy;  Laterality: N/A;   CORONARY STENT PLACEMENT     ELBOW SURGERY Right    tendon surgery   ESOPHAGOGASTRODUODENOSCOPY (EGD) WITH PROPOFOL  N/A 11/17/2012   Procedure: ESOPHAGOGASTRODUODENOSCOPY (EGD) WITH PROPOFOL ;  Surgeon: Garrett Kallman, MD;  Location: [facility] ENDOSCOPY;  Service: Endoscopy;  Laterality: N/A;   GANGLION CYST EXCISION Bilateral 10/29/2012   wrist   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   LEFT HEART CATHETERIZATION WITH CORONARY ANGIOGRAM N/A 04/14/2014   Procedure: LEFT HEART CATHETERIZATION WITH CORONARY ANGIOGRAM;  Surgeon: Mickiel Albany, MD;  Location: [facility] CATH LAB;  Service: Cardiovascular;  Laterality: N/A;   PERIPHERAL VASCULAR CATHETERIZATION N/A 03/09/2015   Procedure: Fistulagram;  Surgeon: Arvil Lauber, MD;  Location: [facility] INVASIVE CV LAB;  Service: Cardiovascular;  Laterality: N/A;   TEE WITHOUT CARDIOVERSION N/A 01/20/2015   Procedure: TRANSESOPHAGEAL ECHOCARDIOGRAM (TEE);  Surgeon: Elmyra Haggard, MD;  Location: [facility] ENDOSCOPY;  Service: Cardiovascular;  Laterality: N/A;   TUBAL LIGATION     VIDEO BRONCHOSCOPY  08/18/2023   Procedure: BRONCHOSCOPY, WITH FLUOROSCOPY;  Surgeon: Denson Flake, MD;  Location: [facility] ENDOSCOPY;  Service: Pulmonary;;    Prior  to Admission medications   Medication Sig Start Date End Date Taking? Authorizing Provider  albuterol  (VENTOLIN  HFA) 108 (90 Base) MCG/ACT inhaler Inhale 2 puffs into the lungs See admin instructions. Inhale 2 puffs into the lungs in the morning and at bedtime- may use an additional 2 puffs twice a day as needed for shortness of breath or wheezing   Yes [provider]  amLODipine  (NORVASC ) 10 MG tablet Take 10 mg by  mouth daily. 03/04/17  Yes [provider]  aspirin  EC 81 MG tablet Take 81 mg by mouth daily.   Yes [provider]  atorvastatin  (LIPITOR) 10 MG tablet Take 1 tablet (10 mg total) by mouth daily. Patient taking differently: Take 10 mg by mouth at bedtime. 06/08/20  Yes Arty Binning, MD  carvedilol  (COREG ) 25 MG tablet Take 12.5 mg by mouth in the morning and at bedtime.   Yes [provider]  glimepiride  (AMARYL ) 2 MG tablet Take 2 mg by mouth daily.   Yes [provider]  insulin  lispro (HUMALOG) 100 UNIT/ML KiwkPen Inject 8-13 Units into the skin See admin instructions. Inject 8-13 units into the skin three times a day with meals, PER SLIDING SCALE (when able to eat and tolerate) 01/22/18  Yes [provider]  LANTUS  SOLOSTAR 100 UNIT/ML Solostar Pen Inject 22 Units into the skin in the morning.   Yes [provider]  latanoprost  (XALATAN ) 0.005 % ophthalmic solution Place 1 drop into both eyes at bedtime.   Yes [provider]  nitroGLYCERIN  (NITROSTAT ) 0.4 MG SL tablet Place 0.4 mg under the tongue every 5 (five) minutes as needed for chest pain.   Yes [provider]  ondansetron  (ZOFRAN ) 8 MG tablet Take 1 tablet (8 mg total) by mouth every 8 (eight) hours as needed for nausea or vomiting. Start on the third day after chemotherapy. 09/09/23  Yes Marlene Simas, MD  oxyCODONE -acetaminophen  (PERCOCET/ROXICET) 5-325 MG tablet Take 1 tablet by mouth every 6 (six) hours as needed for severe pain (pain score 7-10). 09/22/23  Yes Retta Caster, MD  predniSONE  (DELTASONE ) 5 MG tablet Take 5 mg by mouth daily with breakfast.  12/22/17  Yes [provider]  prochlorperazine  (COMPAZINE ) 10 MG tablet Take 1 tablet (10 mg total) by mouth every 6 (six) hours as needed for nausea or vomiting. 09/09/23  Yes Marlene Simas, MD  sirolimus (RAPAMUNE) 1 MG tablet Take 2 mg by mouth daily. 09/16/23  Yes [provider]   tacrolimus  ER (ENVARSUS  XR) 1 MG TB24 Take 1 mg by mouth daily. 08/10/20  Yes [provider]  umeclidinium-vilanterol (ANORO ELLIPTA) 62.5-25 MCG/INH AEPB Inhale 1 puff into the lungs daily.  01/07/18  Yes [provider]  Accu-Chek Softclix Lancets lancets DX: E11.21 as directed 12/06/16   [provider]  Blood Glucose Calibration (ACCU-CHEK AVIVA) SOLN DX: E11.21 as directed 12/06/16   [provider]  carvedilol  (COREG ) 12.5 MG tablet Take 1 tablet (12.5 mg total) by mouth 2 (two) times daily. Patient not taking: Reported on 09/29/2023 06/08/20   Arty Binning, MD  COVID-19 mRNA bivalent vaccine, Pfizer, (PFIZER COVID-19 [facility] BIVALENT) injection Inject into the muscle. Patient not taking: Reported on 09/29/2023 01/30/21   Liane Redman, MD  COVID-19 mRNA Vac-TriS, Pfizer, (PFIZER-BIONT COVID-19 VAC-TRIS) SUSP injection Inject into the muscle. Patient not taking: Reported on 09/29/2023 10/02/20   Liane Redman, MD  glucose blood (ACCU-CHEK AVIVA PLUS) test strip use as directed to check  blood sugar  4 times daily    [provider]  influenza vaccine adjuvanted (FLUAD) 0.5 ML injection Inject into the muscle. Patient not taking: Reported on 09/29/2023 01/30/21   Liane Redman, MD    Scheduled Meds:  enoxaparin  (LOVENOX ) injection  40 mg Subcutaneous Q24H   insulin  aspart  0-15 Units Subcutaneous TID WC   insulin  aspart  0-5 Units Subcutaneous QHS   Continuous Infusions:  sodium chloride      PRN Meds:.hydrALAZINE , morphine  injection, ondansetron  **OR** ondansetron  (ZOFRAN ) IV, oxyCODONE   Allergies as of 09/29/2023   (No Known Allergies)    Family History  Problem Relation Age of Onset   Diabetes Father    Cancer Father    Hypertension Father    Heart attack Mother    Hypertension Mother    Diabetes Sister    Hypertension Sister    Cancer Sister    Hypertension Sister    Breast cancer Sister     Social History   Socioeconomic  History   Marital status: Married    Spouse name: Not on file   Number of children: Not on file   Years of education: Not on file   Highest education level: Not on file  Occupational History   Not on file  Tobacco Use   Smoking status: Former    Current packs/day: 0.00    Types: Cigarettes    Quit date: 10/29/1992    Years since quitting: 30.9   Smokeless tobacco: Never  Vaping Use   Vaping status: Never Used  Substance and Sexual Activity   Alcohol use: No    Alcohol/week: 0.0 standard drinks of alcohol   Drug use: No   Sexual activity: Not Currently  Other Topics Concern   Not on file  Social History Narrative   Not on file   Social Drivers of Health   Financial Resource Strain: Not on file  Food Insecurity: No Food Insecurity (09/08/2023)   Hunger Vital Sign    Worried About Running Out of Food in the Last Year: Never true    Ran Out of Food in the Last Year: Never true  Transportation Needs: No Transportation Needs (09/08/2023)   PRAPARE - Administrator, Civil Service (Medical): No    Lack of Transportation (Non-Medical): No  Physical Activity: Not on file  Stress: Not on file  Social Connections: Not on file  Intimate Partner Violence: Not At Risk (09/08/2023)   Humiliation, Afraid, Rape, and Kick questionnaire    Fear of Current or Ex-Partner: No    Emotionally Abused: No    Physically Abused: No    Sexually Abused: No    Review of Systems: All negative except as stated above in HPI.  Physical Exam: Vital signs: Vitals:   09/29/23 1400 09/29/23 1545  BP: (!) 172/55 (!) 150/73  Pulse: (!) 109 (!) 106  Resp:  17  Temp:  99.1 F (37.3 C)  SpO2: 97% 94%     General: Lethargic, elderly, thin, no acute distress  Head: normocephalic, atraumatic Eyes: +icteric sclera ENT: oropharynx clear Neck: supple, nontender Lungs:  Clear throughout to auscultation.   No wheezes, crackles, or rhonchi. No acute distress. Heart:  Regular rate and rhythm; no  murmurs, clicks, rubs,  or gallops. Abdomen: soft, nontender, nondistended, +BS  Rectal:  Deferred Ext: no edema  GI:  Lab Results: Recent Labs    09/29/23 0731  WBC 7.6  HGB 14.4  HCT 42.5  PLT 113*   BMET Recent Labs  09/29/23 0731  NA 129*  K 3.8  CL 94*  CO2 23  GLUCOSE 174*  BUN 15  CREATININE 0.99  CALCIUM  8.6*   LFT Recent Labs    09/29/23 0731  PROT 6.7  ALBUMIN 3.8  AST 220*  ALT 421*  ALKPHOS 267*  BILITOT 4.9*   PT/INR No results for input(s): "LABPROT", "INR" in the last 72 hours.   Studies/Results: CT ABDOMEN PELVIS W CONTRAST Result Date: 09/29/2023 CLINICAL DATA:  Abdominal pain, acute, nonlocalized. * Tracking Code: BO * EXAM: CT ABDOMEN AND PELVIS WITH CONTRAST TECHNIQUE: Multidetector CT imaging of the abdomen and pelvis was performed using the standard protocol following bolus administration of intravenous contrast. RADIATION DOSE REDUCTION: This exam was performed according to the departmental dose-optimization program which includes automated exposure control, adjustment of the mA and/or kV according to patient size and/or use of iterative reconstruction technique. CONTRAST:  OMNIPAQUE  IOHEXOL  300 MG/ML  SOLN COMPARISON:  CT scan abdomen and pelvis from 02/01/2016 and PET-CT scan from 09/04/2023. FINDINGS: Lower chest: The lung bases are clear. No pleural effusion. The heart is normal in size. No pericardial effusion. Hepatobiliary: The liver is normal in size. Non-cirrhotic configuration. No suspicious mass. These is mild diffuse hepatic steatosis. There is moderate to severe intra and extrahepatic bile duct dilation up to the level of ampulla of Vater, where there is gradual tapering. No discrete obstructing mass seen. There are subtle faintly calcified 2, 2-3 mm calcifications in the region of ampulla of Vater, which are likely in the pancreatic parenchyma. No discrete choledocholithiasis seen. Gallbladder is surgically absent. Pancreas:  Small/atrophic pancreas. There are multiple dystrophic calcifications mainly in the pancreatic head/uncinate process region, likely sequela of chronic pancreatitis. No suspicious mass or peripancreatic fat stranding. Main pancreatic duct is not dilated. Spleen: Within normal limits. No focal lesion. Adrenals/Urinary Tract: Stable bilateral adrenal glands. Redemonstration of small/atrophic bilateral native kidneys. There is a mildly complex cyst arising from the left kidney lower pole measuring at least 1.2 x 1.5 cm, exhibiting single 1-2 mm sized calcified septum. There is a smaller simple cyst arising from the right kidney upper pole, posteriorly. No hydroureteronephrosis on either side. There are several sub 5 mm nonobstructing calculi in bilateral kidneys. No ureterolithiasis. 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 Unremarkable urinary bladder. Stomach/Bowel: There is small sliding hiatal hernia. No disproportionate dilation of the small or large bowel loops. No evidence of abnormal bowel wall thickening or inflammatory changes. The appendix was not visualized; however there is no acute inflammatory process in the right lower quadrant. There are multiple diverticula mainly in the sigmoid colon, without imaging signs of diverticulitis. Vascular/Lymphatic: No ascites or pneumoperitoneum. No abdominal or pelvic lymphadenopathy, by size criteria. No aneurysmal dilation of the major abdominal arteries. There are moderate peripheral atherosclerotic vascular calcifications of the aorta and its major branches. Reproductive: The uterus is surgically absent. No large adnexal mass. Other: There is a tiny fat containing umbilical hernia. The soft tissues and abdominal wall are otherwise unremarkable. Musculoskeletal: No suspicious osseous lesions. There are mild - moderate multilevel degenerative changes in the visualized spine. IMPRESSION: 1. No acute inflammatory process identified  within the abdomen or pelvis. 2. There is moderate to severe intra and extrahepatic bile duct dilation up to the level of Ampulla of Vater, where there is gradual tapering. No discrete obstructing mass seen. There are subtle faintly calcified 2-3 mm calcifications in the region of Ampulla of Vater, which are likely in the pancreatic  parenchyma. No discrete choledocholithiasis seen. If there is clinical concern for biliary obstruction, consider further evaluation with ERCP versus MRCP. 3. Multiple other nonacute observations, as described above. Aortic Atherosclerosis (ICD10-I70.0). Electronically Signed   By: Beula Brunswick M.D.   On: 09/29/2023 13:41    Impression/Plan: Painless jaundice with vomiting and diarrhea in setting of recently diagnosed non-small cell lung cancer on chemotherapy. Biliary ductal dilation on CT. Awaiting MRCP to better evaluate biliary tree. Supportive care. Clear liquid diet. Will follow.    LOS: 0 days   [name]  09/29/2023, 4:09 PM  Questions please call [phone]

## 2023-09-30 ENCOUNTER — Other Ambulatory Visit: Payer: Self-pay

## 2023-09-30 ENCOUNTER — Ambulatory Visit
Admission: RE | Admit: 2023-09-30 | Discharge: 2023-09-30 | Disposition: A | Discharge status: Home / Self Care | Source: Ambulatory Visit | Attending: Radiation Oncology | Admitting: Radiation Oncology

## 2023-09-30 DIAGNOSIS — R112 Nausea with vomiting, unspecified: Secondary | ICD-10-CM | POA: Diagnosis not present

## 2023-09-30 DIAGNOSIS — Z79899 Other long term (current) drug therapy: Secondary | ICD-10-CM | POA: Diagnosis not present

## 2023-09-30 DIAGNOSIS — K838 Other specified diseases of biliary tract: Secondary | ICD-10-CM | POA: Diagnosis not present

## 2023-09-30 DIAGNOSIS — R748 Abnormal levels of other serum enzymes: Secondary | ICD-10-CM | POA: Diagnosis not present

## 2023-09-30 DIAGNOSIS — I120 Hypertensive chronic kidney disease with stage 5 chronic kidney disease or end stage renal disease: Secondary | ICD-10-CM | POA: Diagnosis not present

## 2023-09-30 DIAGNOSIS — I5022 Chronic systolic (congestive) heart failure: Secondary | ICD-10-CM | POA: Diagnosis present

## 2023-09-30 DIAGNOSIS — E669 Obesity, unspecified: Secondary | ICD-10-CM | POA: Diagnosis present

## 2023-09-30 DIAGNOSIS — J449 Chronic obstructive pulmonary disease, unspecified: Secondary | ICD-10-CM | POA: Diagnosis present

## 2023-09-30 DIAGNOSIS — R7401 Elevation of levels of liver transaminase levels: Secondary | ICD-10-CM | POA: Diagnosis present

## 2023-09-30 DIAGNOSIS — C3432 Malignant neoplasm of lower lobe, left bronchus or lung: Secondary | ICD-10-CM | POA: Diagnosis not present

## 2023-09-30 DIAGNOSIS — Z7960 Long term (current) use of unspecified immunomodulators and immunosuppressants: Secondary | ICD-10-CM | POA: Diagnosis not present

## 2023-09-30 DIAGNOSIS — Z87891 Personal history of nicotine dependence: Secondary | ICD-10-CM | POA: Diagnosis not present

## 2023-09-30 DIAGNOSIS — E1122 Type 2 diabetes mellitus with diabetic chronic kidney disease: Secondary | ICD-10-CM | POA: Diagnosis present

## 2023-09-30 DIAGNOSIS — E860 Dehydration: Secondary | ICD-10-CM | POA: Diagnosis present

## 2023-09-30 DIAGNOSIS — D696 Thrombocytopenia, unspecified: Secondary | ICD-10-CM | POA: Diagnosis present

## 2023-09-30 DIAGNOSIS — E871 Hypo-osmolality and hyponatremia: Secondary | ICD-10-CM | POA: Diagnosis present

## 2023-09-30 DIAGNOSIS — Z7984 Long term (current) use of oral hypoglycemic drugs: Secondary | ICD-10-CM | POA: Diagnosis not present

## 2023-09-30 DIAGNOSIS — R197 Diarrhea, unspecified: Secondary | ICD-10-CM | POA: Diagnosis not present

## 2023-09-30 DIAGNOSIS — Z955 Presence of coronary angioplasty implant and graft: Secondary | ICD-10-CM | POA: Diagnosis not present

## 2023-09-30 DIAGNOSIS — E876 Hypokalemia: Secondary | ICD-10-CM | POA: Diagnosis present

## 2023-09-30 DIAGNOSIS — E785 Hyperlipidemia, unspecified: Secondary | ICD-10-CM | POA: Diagnosis present

## 2023-09-30 DIAGNOSIS — Z803 Family history of malignant neoplasm of breast: Secondary | ICD-10-CM | POA: Diagnosis not present

## 2023-09-30 DIAGNOSIS — Z510 Encounter for antineoplastic radiation therapy: Secondary | ICD-10-CM | POA: Diagnosis not present

## 2023-09-30 DIAGNOSIS — R7989 Other specified abnormal findings of blood chemistry: Secondary | ICD-10-CM | POA: Diagnosis not present

## 2023-09-30 DIAGNOSIS — R932 Abnormal findings on diagnostic imaging of liver and biliary tract: Secondary | ICD-10-CM | POA: Diagnosis not present

## 2023-09-30 DIAGNOSIS — D649 Anemia, unspecified: Secondary | ICD-10-CM | POA: Diagnosis present

## 2023-09-30 DIAGNOSIS — I2510 Atherosclerotic heart disease of native coronary artery without angina pectoris: Secondary | ICD-10-CM | POA: Diagnosis not present

## 2023-09-30 DIAGNOSIS — Z794 Long term (current) use of insulin: Secondary | ICD-10-CM | POA: Diagnosis not present

## 2023-09-30 DIAGNOSIS — N186 End stage renal disease: Secondary | ICD-10-CM | POA: Diagnosis not present

## 2023-09-30 DIAGNOSIS — Z940 Kidney transplant status: Secondary | ICD-10-CM | POA: Diagnosis not present

## 2023-09-30 DIAGNOSIS — Z7982 Long term (current) use of aspirin: Secondary | ICD-10-CM | POA: Diagnosis not present

## 2023-09-30 DIAGNOSIS — K831 Obstruction of bile duct: Secondary | ICD-10-CM | POA: Diagnosis present

## 2023-09-30 DIAGNOSIS — D84821 Immunodeficiency due to drugs: Secondary | ICD-10-CM | POA: Diagnosis present

## 2023-09-30 DIAGNOSIS — R945 Abnormal results of liver function studies: Secondary | ICD-10-CM | POA: Diagnosis not present

## 2023-09-30 DIAGNOSIS — Z8249 Family history of ischemic heart disease and other diseases of the circulatory system: Secondary | ICD-10-CM | POA: Diagnosis not present

## 2023-09-30 LAB — COMPREHENSIVE METABOLIC PANEL WITH GFR
ALT: 272 U/L — ABNORMAL HIGH (ref 0–44)
AST: 109 U/L — ABNORMAL HIGH (ref 15–41)
Albumin: 2.9 g/dL — ABNORMAL LOW (ref 3.5–5.0)
Alkaline Phosphatase: 236 U/L — ABNORMAL HIGH (ref 38–126)
Anion gap: 11 (ref 5–15)
BUN: 10 mg/dL (ref 8–23)
CO2: 22 mmol/L (ref 22–32)
Calcium: 8.3 mg/dL — ABNORMAL LOW (ref 8.9–10.3)
Chloride: 96 mmol/L — ABNORMAL LOW (ref 98–111)
Creatinine, Ser: 0.6 mg/dL (ref 0.44–1.00)
GFR, Estimated: >60 mL/min (ref >60)
Glucose, Bld: 134 mg/dL — ABNORMAL HIGH (ref 70–99)
Potassium: 3.2 mmol/L — ABNORMAL LOW (ref 3.5–5.1)
Sodium: 129 mmol/L — ABNORMAL LOW (ref 135–145)
Total Bilirubin: 3.8 mg/dL — ABNORMAL HIGH (ref 0.0–1.2)
Total Protein: 6.2 g/dL — ABNORMAL LOW (ref 6.5–8.1)

## 2023-09-30 LAB — CBC
HCT: 39.6 % (ref 36.0–46.0)
Hemoglobin: 12.9 g/dL (ref 12.0–15.0)
MCH: 28.4 pg (ref 26.0–34.0)
MCHC: 32.6 g/dL (ref 30.0–36.0)
MCV: 87 fL (ref 80.0–100.0)
Platelets: 95 10*3/uL — ABNORMAL LOW (ref 150–400)
RBC: 4.55 MIL/uL (ref 3.87–5.11)
RDW: 13.7 % (ref 11.5–15.5)
WBC: 5.6 10*3/uL (ref 4.0–10.5)
nRBC: 0 % (ref 0.0–0.2)

## 2023-09-30 LAB — RAD ONC ARIA SESSION SUMMARY
Course Elapsed Days: 14
Plan Fractions Treated to Date: 11
Plan Prescribed Dose Per Fraction: 2 Gy
Plan Total Fractions Prescribed: 30
Plan Total Prescribed Dose: 60 Gy
Reference Point Dosage Given to Date: 22 Gy
Reference Point Session Dosage Given: 2 Gy
Session Number: 11

## 2023-09-30 LAB — HEMOGLOBIN A1C
Hgb A1c MFr Bld: 7.4 % — ABNORMAL HIGH (ref 4.8–5.6)
Mean Plasma Glucose: 165.68 mg/dL

## 2023-09-30 LAB — MISC LABCORP TEST (SEND OUT): Labcorp test code: 144000

## 2023-09-30 LAB — GLUCOSE, CAPILLARY
Glucose-Capillary: 140 mg/dL — ABNORMAL HIGH (ref 70–99)
Glucose-Capillary: 165 mg/dL — ABNORMAL HIGH (ref 70–99)
Glucose-Capillary: 264 mg/dL — ABNORMAL HIGH (ref 70–99)
Glucose-Capillary: 109 mg/dL — ABNORMAL HIGH (ref 70–99)
Glucose-Capillary: 91 mg/dL (ref 70–99)

## 2023-09-30 LAB — MAGNESIUM: Magnesium: 1.7 mg/dL (ref 1.7–2.4)

## 2023-09-30 MED ORDER — POTASSIUM CHLORIDE CRYS ER 20 MEQ PO TBCR
40.0000 meq | EXTENDED_RELEASE_TABLET | ORAL | Status: AC
  Administered 2023-09-30 (×2): 40 meq via ORAL
  Filled 2023-09-30 (×2): qty 2

## 2023-09-30 MED ORDER — CARVEDILOL 12.5 MG PO TABS
12.5000 mg | ORAL_TABLET | Freq: Two times a day (BID) | ORAL | Status: DC
  Administered 2023-09-30 – 2023-10-01 (×3): 12.5 mg via ORAL
  Filled 2023-09-30 (×3): qty 1

## 2023-09-30 MED ORDER — ORAL CARE MOUTH RINSE: 15.0000 mL | OROMUCOSAL | Status: DC | PRN

## 2023-09-30 MED ORDER — ALBUTEROL SULFATE (2.5 MG/3ML) 0.083% IN NEBU
2.5000 mg | INHALATION_SOLUTION | Freq: Four times a day (QID) | RESPIRATORY_TRACT | Status: DC | PRN
  Administered 2023-09-30 – 2023-10-01 (×2): 2.5 mg via RESPIRATORY_TRACT
  Filled 2023-09-30 (×2): qty 3

## 2023-09-30 MED ORDER — TACROLIMUS ER 1 MG PO TB24
1.0000 mg | ORAL_TABLET | Freq: Every day | ORAL | Status: DC
  Filled 2023-09-30 (×3): qty 1

## 2023-09-30 MED ORDER — PREDNISONE 5 MG PO TABS
5.0000 mg | ORAL_TABLET | Freq: Every day | ORAL | Status: DC
  Administered 2023-09-30 – 2023-10-01 (×2): 5 mg via ORAL
  Filled 2023-09-30 (×2): qty 1

## 2023-09-30 MED ORDER — SODIUM CHLORIDE 0.9 % IV SOLN
INTRAVENOUS | Status: AC
Start: 2023-09-30 — End: 2023-10-01

## 2023-09-30 MED ORDER — SIROLIMUS 0.5 MG PO TABS
2.0000 mg | ORAL_TABLET | Freq: Every day | ORAL | Status: DC
  Administered 2023-09-30 – 2023-10-01 (×2): 2 mg via ORAL
  Filled 2023-09-30 (×2): qty 4

## 2023-09-30 NOTE — Progress Notes (Signed)
 [facility] Gastroenterology Progress Note  Pamela [name] 73 y.o. May 07, 1951   Subjective: Less nausea today. No vomiting. Diarrhea improving. Husband in room.  Objective: Vital signs: Vitals:   09/30/23 0614 09/30/23 0922  BP: (!) 156/59 (!) 162/64  Pulse: (!) 118 (!) 119  Resp: (!) 22   Temp: 99.9 F (37.7 C) 99.6 F (37.6 C)  SpO2: 97% 94%    Physical Exam: Gen: lethargic, elderly, well-nourished, no acute distress  HEENT: anicteric sclera CV: RRR Chest: CTA B Abd: soft, nontender, nondistended, +BS Ext: no edema  Lab Results: Recent Labs    09/29/23 0731 09/30/23 0416  NA 129* 129*  K 3.8 3.2*  CL 94* 96*  CO2 23 22  GLUCOSE 174* 134*  BUN 15 10  CREATININE 0.99 0.60  CALCIUM  8.6* 8.3*  MG  --  1.7   Recent Labs    09/29/23 0731 09/30/23 0416  AST 220* 109*  ALT 421* 272*  ALKPHOS 267* 236*  BILITOT 4.9* 3.8*  PROT 6.7 6.2*  ALBUMIN 3.8 2.9*   Recent Labs    09/29/23 0731 09/30/23 0416  WBC 7.6 5.6  NEUTROABS 6.8  --   HGB 14.4 12.9  HCT 42.5 39.6  MCV 83.8 87.0  PLT 113* 95*      Assessment/Plan: Jaundice concerning for biliary duct stenosis on MRCP. ERCP tomorrow by Dr. Feliberto Hopping. Clear liquid diet today and NPO p MN. Supportive care. Risks/benefits of ERCP discussed and she agrees to proceed.   Pamela [name] 09/30/2023, 11:49 AM  Questions please call [phone] ID: Pamela [name], female   DOB: May 26, 1950, 73 y.o.   MRN: [id number]

## 2023-09-30 NOTE — TOC CM/SW Note (Signed)
 Transition of Care [facility]) - Inpatient Brief Assessment   Patient Details  Name: Pamela [name] MRN: [id number] Date of Birth: 1950-09-04  Transition of Care [facility]) CM/SW Contact:    Gertha Ku, LCSW Phone Number: 09/30/2023, 3:38 PM    Transition of Care Asessment: Insurance and Status: Insurance coverage has been reviewed Patient has primary care physician: Yes Home environment has been reviewed: home with spouse Prior level of function:: independent Prior/Current Home Services: No current home services Social Drivers of Health Review: SDOH reviewed no interventions necessary Readmission risk has been reviewed: Yes Transition of care needs: no transition of care needs at this time

## 2023-09-30 NOTE — Care Management Obs Status (Signed)
 MEDICARE OBSERVATION STATUS NOTIFICATION   Patient Details  Name: Pamela [name] MRN: [id number] Date of Birth: 1950/06/18   Medicare Observation Status Notification Given:  Yes    Gertha Ku, LCSW 09/30/2023, 3:37 PM

## 2023-09-30 NOTE — Anesthesia Preprocedure Evaluation (Addendum)
 Anesthesia Evaluation  Patient identified by MRN, date of birth, ID band Patient awake    Reviewed: Allergy & Precautions, NPO status , Patient's Chart, lab work & pertinent test results  History of Anesthesia Complications (+) PONV and history of anesthetic complications  Airway Mallampati: III  TM Distance: >3 FB Neck ROM: Full    Dental  (+) Dental Advisory Given, Edentulous Upper, Edentulous Lower   Pulmonary neg shortness of breath, COPD (used inhaler this morning),  COPD inhaler, neg recent URI, former smoker LLL cancer   Pulmonary exam normal breath sounds clear to auscultation       Cardiovascular hypertension (amlodipine , carvedilol ), Pt. on medications and Pt. on home beta blockers (-) angina + CAD, + Cardiac Stents and +CHF  (-) CABG (-) dysrhythmias  Rhythm:Regular Rate:Normal  HLD  TTE 01/26/2019: IMPRESSIONS    1. The left ventricle has normal systolic function, with an ejection  fraction of 55-60%. The cavity size was normal. Left ventricular diastolic  Doppler parameters are consistent with impaired relaxation. Elevated mean  left atrial pressure.   2. The right ventricle has normal systolic function. The cavity was  normal.   3. The mitral valve is grossly normal. There is mild mitral annular  calcification present.   4. The aortic valve is tricuspid. Mild thickening of the aortic valve. No  stenosis of the aortic valve.   5. The aorta is normal unless otherwise noted.   6. Normal LV systolic function; grade 1 diastolic dysfunction.     Neuro/Psych  Headaches, neg Seizures PSYCHIATRIC DISORDERS Anxiety        GI/Hepatic negative GI ROS,,,Elevated LFTs   Endo/Other  diabetes (Hgb A1c 7.4), Poorly Controlled, Type 2, Oral Hypoglycemic Agents, Insulin  Dependent    Renal/GU ESRFRenal disease (s/p kidney transplant)     Musculoskeletal   Abdominal   Peds  Hematology  (+) Blood dyscrasia, anemia  Lab Results      Component                Value               Date                      WBC                      5.6                 09/30/2023                HGB                      12.9                09/30/2023                HCT                      39.6                09/30/2023                MCV                      87.0                09/30/2023  PLT                      95 (L)              09/30/2023              Anesthesia Other Findings Taking prednisone  5 mg  Reproductive/Obstetrics                             Anesthesia Physical Anesthesia Plan  ASA: 3  Anesthesia Plan: General   Post-op Pain Management:    Induction: Intravenous  PONV Risk Score and Plan: 4 or greater and Ondansetron , Dexamethasone  and Treatment may vary due to age or medical condition  Airway Management Planned: Oral ETT  Additional Equipment:   Intra-op Plan:   Post-operative Plan: Extubation in OR  Informed Consent: I have reviewed the patients History and Physical, chart, labs and discussed the procedure including the risks, benefits and alternatives for the proposed anesthesia with the patient or authorized representative who has indicated his/her understanding and acceptance.     Dental advisory given  Plan Discussed with: CRNA and Anesthesiologist  Anesthesia Plan Comments: (Risks of general anesthesia discussed including, but not limited to, sore throat, hoarse voice, chipped/damaged teeth, injury to vocal cords, nausea and vomiting, allergic reactions, lung infection, heart attack, stroke, and death. All questions answered. )        Anesthesia Quick Evaluation

## 2023-09-30 NOTE — H&P (View-Only) (Signed)
 [facility] Gastroenterology Progress Note  Pamela [name] 73 y.o. May 07, 1951   Subjective: Less nausea today. No vomiting. Diarrhea improving. Husband in room.  Objective: Vital signs: Vitals:   09/30/23 0614 09/30/23 0922  BP: (!) 156/59 (!) 162/64  Pulse: (!) 118 (!) 119  Resp: (!) 22   Temp: 99.9 F (37.7 C) 99.6 F (37.6 C)  SpO2: 97% 94%    Physical Exam: Gen: lethargic, elderly, well-nourished, no acute distress  HEENT: anicteric sclera CV: RRR Chest: CTA B Abd: soft, nontender, nondistended, +BS Ext: no edema  Lab Results: Recent Labs    09/29/23 0731 09/30/23 0416  NA 129* 129*  K 3.8 3.2*  CL 94* 96*  CO2 23 22  GLUCOSE 174* 134*  BUN 15 10  CREATININE 0.99 0.60  CALCIUM  8.6* 8.3*  MG  --  1.7   Recent Labs    09/29/23 0731 09/30/23 0416  AST 220* 109*  ALT 421* 272*  ALKPHOS 267* 236*  BILITOT 4.9* 3.8*  PROT 6.7 6.2*  ALBUMIN 3.8 2.9*   Recent Labs    09/29/23 0731 09/30/23 0416  WBC 7.6 5.6  NEUTROABS 6.8  --   HGB 14.4 12.9  HCT 42.5 39.6  MCV 83.8 87.0  PLT 113* 95*      Assessment/Plan: Jaundice concerning for biliary duct stenosis on MRCP. ERCP tomorrow by Dr. Feliberto Hopping. Clear liquid diet today and NPO p MN. Supportive care. Risks/benefits of ERCP discussed and she agrees to proceed.   Pamela [name] 09/30/2023, 11:49 AM  Questions please call [phone] ID: Pamela [name], female   DOB: May 26, 1950, 73 y.o.   MRN: [id number]

## 2023-09-30 NOTE — Progress Notes (Addendum)
 PROGRESS NOTE    Pamela [name]  FAO:[id number] DOB: 04/22/1951 DOA: 09/29/2023 PCP: [name], MD   Brief Narrative:  73 y.o. 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  On presentation, she had AST of 220, ALT of 421, total bilirubin of 4.9 and alkaline phosphatase of 267 with lipase of 19. CT of abdomen and pelvis with contrast showed no acute inflammatory process with moderate to severe intra and extrahepatic bile duct dilation up to the level of ampulla, where there was gradual tapering of; no distinct obstructing mass seen with no discrete choledocholithiasis.  GI was consulted.  MRI/MRCP ordered.  Assessment & Plan:   Elevated LFTs including elevated bilirubin with severe intra and extrahepatic biliary dilatation - Presented with elevated LFTs including elevated bilirubin and imaging as above.  No discrete choledocholithiasis or obstructing mass seen. - MRCP/MRI showed intra and extrahepatic bile duct dilatation without obstructing mass or choledocholithiasis; 4 x 6 mm stone in the left mild bile duct; findings favoring ampullary stenosis. - LFTs improving.  Follow GI recommendations. - Will keep n.p.o. for now in case patient undergoes ERCP.   Nausea, vomiting, diarrhea - Possibly chemotherapy-induced.  Use antiemetics as needed.  Continue IV fluids at 75 cc an hour.   Hyponatremia Dehydration -Possibly from poor oral intake.  IV fluids as above.  Repeat  a.m. labs  Hypokalemia -Replace.  Repeat a.m. labs   Thrombocytopenia -Questionable cause.  No signs of bleeding.  Monitor.   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  I have added Dr. Marguerita Shih to the care teams. -Outpatient follow-up with oncology   History of renal transplantation currently on immunosuppressants History of end-stage renal disease requiring hemodialysis in the past - Renal function currently stable.  Will resume home immunosuppression.  Outpatient follow-up with transplant team   Diabetes mellitus type 2 - CBGs with SSI.  Amaryl  on hold.   Hypertension - Monitor blood pressure.  Resume Coreg .  Might have to resume amlodipine  if blood pressure remains elevated.  Use IV antihypertensives if needed.  Hyperlipidemia - Hold statin because of elevated LFTs   COPD -Stable.  Use bronchodilators if needed  Obesity -outpatient follow-up   DVT prophylaxis: Lovenox  Code Status: Full Family Communication: husband at bedside Disposition Plan: Status is: Observation The patient will require care spanning > 2 midnights and should be moved to inpatient because: Of severity of illness  Consultants: GI  Procedures: None  Antimicrobials: None   Subjective: Patient seen and examined at bedside.  He is slightly better.  Still nauseous but denies any vomiting since admission.  Had fever earlier this morning.  Objective: Vitals:   09/30/23 0203 09/30/23 0456 09/30/23 0614 09/30/23 0922  BP: (!) 156/66  (!) 156/59 (!) 162/64  Pulse: (!) 120  (!) 118 (!) 119  Resp:   (!) 22   Temp: (!) 100.5 F (38.1 C)  99.9 F (37.7 C) 99.6 F (37.6 C)  TempSrc: Oral  Oral Oral  SpO2: 99%  97% 94%  Weight:  75.2 kg    Height:        Intake/Output Summary (Last 24 hours) at 09/30/2023 0936 Last data  filed at 09/30/2023 0900 Gross per 24 hour  Intake 1010.93 ml  Output 1000 ml  Net 10.93 ml   Filed Weights   09/29/23 1025 09/30/23 0456  Weight: 73.9 kg 75.2 kg    Examination:  General exam: Appears calm and comfortable.  Strangulating the condition. Respiratory system: Bilateral decreased breath sounds at bases with scattered crackles and intermittent tachypnea Cardiovascular system: S1 & S2 heard, tachycardic  gastrointestinal system: Abdomen is obese, nondistended, soft and nontender. Normal bowel sounds heard. Extremities: No cyanosis, clubbing, edema  Central nervous system: Alert and oriented. No focal neurological deficits. Moving extremities Skin: No rashes, lesions or ulcers Psychiatry: Mostly flat affect.  Not agitated.   Data Reviewed: I have personally reviewed following labs and imaging studies  CBC: Recent Labs  Lab 09/29/23 0731 09/30/23 0416  WBC 7.6 5.6  NEUTROABS 6.8  --   HGB 14.4 12.9  HCT 42.5 39.6  MCV 83.8 87.0  PLT 113* 95*   Basic Metabolic Panel: Recent Labs  Lab 09/29/23 0731 09/30/23 0416  NA 129* 129*  K 3.8 3.2*  CL 94* 96*  CO2 23 22  GLUCOSE 174* 134*  BUN 15 10  CREATININE 0.99 0.60  CALCIUM  8.6* 8.3*  MG  --  1.7   GFR: Estimated Creatinine Clearance: 58.8 mL/min (by C-G formula based on SCr of 0.6 mg/dL). Liver Function Tests: Recent Labs  Lab 09/29/23 0731 09/30/23 0416  AST 220* 109*  ALT 421* 272*  ALKPHOS 267* 236*  BILITOT 4.9* 3.8*  PROT 6.7 6.2*  ALBUMIN 3.8 2.9*   Recent Labs  Lab 09/29/23 1113  LIPASE 19   No results for input(s): "AMMONIA" in the last 168 hours. Coagulation Profile: No results for input(s): "INR", "PROTIME" in the last 168 hours. Cardiac Enzymes: No results for input(s): "CKTOTAL", "CKMB", "CKMBINDEX", "TROPONINI" in the last 168 hours. BNP (last 3 results) No results for input(s): "PROBNP" in the last 8760 hours. HbA1C: Recent Labs    09/30/23 0416  HGBA1C 7.4*    CBG: Recent Labs  Lab 09/29/23 1622 09/29/23 2045 09/30/23 0756  GLUCAP 205* 84 140*   Lipid Profile: No results for input(s): "CHOL", "HDL", "LDLCALC", "TRIG", "CHOLHDL", "LDLDIRECT" in the last 72 hours. Thyroid Function Tests: No results for input(s): "TSH", "T4TOTAL", "FREET4", "T3FREE", "THYROIDAB" in the last 72 hours. Anemia Panel: No results for input(s): "VITAMINB12", "FOLATE", "FERRITIN", "TIBC", "IRON ", "RETICCTPCT" in the last 72 hours. Sepsis Labs: No results for input(s): "PROCALCITON", "LATICACIDVEN" in the last 168 hours.  No results found for this or any previous visit (from the past 240 hours).       Radiology Studies: MR ABDOMEN MRCP WO CONTRAST Result Date: 09/29/2023 CLINICAL DATA:  Jaundice; Jaundice [id number] Pain 144615. EXAM: MRI ABDOMEN WITHOUT CONTRAST  (INCLUDING MRCP) TECHNIQUE: Multiplanar multisequence MR imaging of the abdomen was performed. Heavily T2-weighted images of the biliary and pancreatic ducts were obtained, and three-dimensional MRCP images were rendered by post processing. COMPARISON:  CT scan abdomen and pelvis from earlier the same day. FINDINGS: Lower chest: Unremarkable MR appearance to the lung bases. No pleural effusion. No pericardial effusion. Normal heart size. Hepatobiliary: The liver is normal in size. Noncirrhotic configuration. No focal lesion. 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 Redemonstration of marked intra and extrahepatic bile duct dilation. Extrahepatic bile duct measures up to 1.9 cm in the proximal portion, 1.4 cm in the midportion and 1.2 cm in the distal portion. It gradually tapers near the ampulla of Vater. No discrete choledocholithiasis seen. And incidental 4 x 6 mm stone is seen in the left main bile duct (series 11, image 14).  Surgically absent gallbladder. Pancreas: Small/atrophic pancreas. No focal mass. Main pancreatic duct is not dilated. No peripancreatic fat stranding. Spleen:  Within normal limits in size and appearance. No focal mass. Adrenals/Urinary Tract: Unremarkable adrenal glands. Small/atrophic bilateral kidneys noted exhibiting multiple simple cysts with largest arising from the right kidney upper pole measuring 1.6 x 1.7 cm. There is a partially exophytic structure arising from the left kidney lower pole measuring approximately 1.3 x 1.5 cm, which appears mildly hyperintense on T1 weighted images and mildly hypointense on T2 weighted images. This is favored to represent proteinaceous/hemorrhagic cyst. No hydroureteronephrosis. 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 Partially seen urinary bladder, which is within normal limits. Stomach/Bowel: There is a small sliding hiatal hernia. Visualized portions within the abdomen are unremarkable. No disproportionate dilation of bowel loops. Vascular/Lymphatic: No pathologically enlarged lymph nodes identified. No abdominal aortic aneurysm demonstrated. No ascites. Other:  None. Musculoskeletal: No suspicious bone lesions identified. IMPRESSION: 1. Redemonstration of marked intra and extrahepatic bile duct dilation without obstructing mass or choledocholithiasis. There is a 4 x 6 mm stone in the left main bile duct. Findings favoring ampullary stenosis. 2. There is loss of signal of liver on in phase images, in comparison to the out of phase images, as well as marked hypointense signal of liver and spleen on T2 weighted images, favoring secondary hemochromatosis. 3. Multiple other nonacute observations, as described above. Electronically Signed   By: Beula Brunswick M.D.   On: 09/29/2023 15:34   MR 3D Recon At Scanner Result Date: 09/29/2023 CLINICAL DATA:  Jaundice; Jaundice [id number] Pain 144615. EXAM: MRI ABDOMEN WITHOUT CONTRAST  (INCLUDING MRCP)  TECHNIQUE: Multiplanar multisequence MR imaging of the abdomen was performed. Heavily T2-weighted images of the biliary and pancreatic ducts were obtained, and three-dimensional MRCP images were rendered by post processing. COMPARISON:  CT scan abdomen and pelvis from earlier the same day. FINDINGS: Lower chest: Unremarkable MR appearance to the lung bases. No pleural effusion. No pericardial effusion. Normal heart size. Hepatobiliary: The liver is normal in size. Noncirrhotic configuration. No focal lesion. 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 Redemonstration of marked intra and extrahepatic bile duct dilation. Extrahepatic bile duct measures up to 1.9 cm in the proximal portion, 1.4 cm in the midportion and 1.2 cm in the distal portion. It gradually tapers near the ampulla of Vater. No discrete choledocholithiasis seen. And incidental 4 x 6 mm stone is seen in the left main bile duct (series 11, image 14). Surgically absent gallbladder. Pancreas: Small/atrophic pancreas. No focal mass. Main pancreatic duct is not dilated. No peripancreatic fat stranding. Spleen:  Within normal limits in size and appearance. No focal mass. Adrenals/Urinary Tract: Unremarkable adrenal glands. Small/atrophic bilateral kidneys noted exhibiting multiple simple cysts with largest arising from the right kidney upper  pole measuring 1.6 x 1.7 cm. There is a partially exophytic structure arising from the left kidney lower pole measuring approximately 1.3 x 1.5 cm, which appears mildly hyperintense on T1 weighted images and mildly hypointense on T2 weighted images. This is favored to represent proteinaceous/hemorrhagic cyst. No hydroureteronephrosis. 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 Partially seen urinary bladder, which is  within normal limits. Stomach/Bowel: There is a small sliding hiatal hernia. Visualized portions within the abdomen are unremarkable. No disproportionate dilation of bowel loops. Vascular/Lymphatic: No pathologically enlarged lymph nodes identified. No abdominal aortic aneurysm demonstrated. No ascites. Other:  None. Musculoskeletal: No suspicious bone lesions identified. IMPRESSION: 1. Redemonstration of marked intra and extrahepatic bile duct dilation without obstructing mass or choledocholithiasis. There is a 4 x 6 mm stone in the left main bile duct. Findings favoring ampullary stenosis. 2. There is loss of signal of liver on in phase images, in comparison to the out of phase images, as well as marked hypointense signal of liver and spleen on T2 weighted images, favoring secondary hemochromatosis. 3. Multiple other nonacute observations, as described above. Electronically Signed   By: Beula Brunswick M.D.   On: 09/29/2023 15:34   CT ABDOMEN PELVIS W CONTRAST Result Date: 09/29/2023 CLINICAL DATA:  Abdominal pain, acute, nonlocalized. * Tracking Code: BO * EXAM: CT ABDOMEN AND PELVIS WITH CONTRAST TECHNIQUE: Multidetector CT imaging of the abdomen and pelvis was performed using the standard protocol following bolus administration of intravenous contrast. RADIATION DOSE REDUCTION: This exam was performed according to the departmental dose-optimization program which includes automated exposure control, adjustment of the mA and/or kV according to patient size and/or use of iterative reconstruction technique. CONTRAST:  OMNIPAQUE  IOHEXOL  300 MG/ML  SOLN COMPARISON:  CT scan abdomen and pelvis from 02/01/2016 and PET-CT scan from 09/04/2023. FINDINGS: Lower chest: The lung bases are clear. No pleural effusion. The heart is normal in size. No pericardial effusion. Hepatobiliary: The liver is normal in size. Non-cirrhotic configuration. No suspicious mass. These is mild diffuse hepatic steatosis. There is  moderate to severe intra and extrahepatic bile duct dilation up to the level of ampulla of Vater, where there is gradual tapering. No discrete obstructing mass seen. There are subtle faintly calcified 2, 2-3 mm calcifications in the region of ampulla of Vater, which are likely in the pancreatic parenchyma. No discrete choledocholithiasis seen. Gallbladder is surgically absent. Pancreas: Small/atrophic pancreas. There are multiple dystrophic calcifications mainly in the pancreatic head/uncinate process region, likely sequela of chronic pancreatitis. No suspicious mass or peripancreatic fat stranding. Main pancreatic duct is not dilated. Spleen: Within normal limits. No focal lesion. Adrenals/Urinary Tract: Stable bilateral adrenal glands. Redemonstration of small/atrophic bilateral native kidneys. There is a mildly complex cyst arising from the left kidney lower pole measuring at least 1.2 x 1.5 cm, exhibiting single 1-2 mm sized calcified septum. There is a smaller simple cyst arising from the right kidney upper pole, posteriorly. No hydroureteronephrosis on either side. There are several sub 5 mm nonobstructing calculi in bilateral kidneys. No ureterolithiasis. 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 Unremarkable urinary bladder. Stomach/Bowel: There is small sliding hiatal hernia. No disproportionate dilation of the small or large bowel loops. No evidence of abnormal bowel wall thickening or inflammatory changes. The appendix was not visualized; however there is no acute inflammatory process in the right lower quadrant. There are multiple diverticula mainly in the sigmoid colon, without imaging signs of diverticulitis. Vascular/Lymphatic: No ascites or pneumoperitoneum. No abdominal or pelvic  lymphadenopathy, by size criteria. No aneurysmal dilation of the major abdominal arteries. There are moderate peripheral atherosclerotic vascular calcifications of the aorta and its  major branches. Reproductive: The uterus is surgically absent. No large adnexal mass. Other: There is a tiny fat containing umbilical hernia. The soft tissues and abdominal wall are otherwise unremarkable. Musculoskeletal: No suspicious osseous lesions. There are mild - moderate multilevel degenerative changes in the visualized spine. IMPRESSION: 1. No acute inflammatory process identified within the abdomen or pelvis. 2. There is moderate to severe intra and extrahepatic bile duct dilation up to the level of Ampulla of Vater, where there is gradual tapering. No discrete obstructing mass seen. There are subtle faintly calcified 2-3 mm calcifications in the region of Ampulla of Vater, which are likely in the pancreatic parenchyma. No discrete choledocholithiasis seen. If there is clinical concern for biliary obstruction, consider further evaluation with ERCP versus MRCP. 3. Multiple other nonacute observations, as described above. Aortic Atherosclerosis (ICD10-I70.0). Electronically Signed   By: Beula Brunswick M.D.   On: 09/29/2023 13:41        Scheduled Meds:  enoxaparin  (LOVENOX ) injection  40 mg Subcutaneous Q24H   feeding supplement  1 Container Oral TID BM   insulin  aspart  0-15 Units Subcutaneous TID WC   insulin  aspart  0-5 Units Subcutaneous QHS   potassium chloride   40 mEq Oral Q4H   Continuous Infusions:  sodium chloride  100 mL/hr at 09/30/23 0441          Audria Leather, MD [facility] 09/30/2023, 9:36 AM

## 2023-10-01 ENCOUNTER — Other Ambulatory Visit: Payer: Self-pay

## 2023-10-01 ENCOUNTER — Encounter (HOSPITAL_COMMUNITY): Payer: Self-pay | Admitting: Internal Medicine

## 2023-10-01 ENCOUNTER — Inpatient Hospital Stay (HOSPITAL_COMMUNITY): Admitting: Anesthesiology

## 2023-10-01 ENCOUNTER — Encounter (HOSPITAL_COMMUNITY)
Admission: EM | Disposition: A | Discharge status: Home / Self Care | Payer: Self-pay | Source: Ambulatory Visit | Attending: Internal Medicine

## 2023-10-01 ENCOUNTER — Inpatient Hospital Stay (HOSPITAL_COMMUNITY)

## 2023-10-01 ENCOUNTER — Ambulatory Visit
Admission: RE | Admit: 2023-10-01 | Discharge: 2023-10-01 | Disposition: A | Discharge status: Home / Self Care | Source: Ambulatory Visit | Attending: Radiation Oncology | Admitting: Radiation Oncology

## 2023-10-01 DIAGNOSIS — R7989 Other specified abnormal findings of blood chemistry: Secondary | ICD-10-CM | POA: Diagnosis not present

## 2023-10-01 DIAGNOSIS — K838 Other specified diseases of biliary tract: Secondary | ICD-10-CM | POA: Diagnosis not present

## 2023-10-01 DIAGNOSIS — R748 Abnormal levels of other serum enzymes: Secondary | ICD-10-CM | POA: Diagnosis not present

## 2023-10-01 DIAGNOSIS — Z87891 Personal history of nicotine dependence: Secondary | ICD-10-CM | POA: Diagnosis not present

## 2023-10-01 DIAGNOSIS — N186 End stage renal disease: Secondary | ICD-10-CM

## 2023-10-01 DIAGNOSIS — C3432 Malignant neoplasm of lower lobe, left bronchus or lung: Secondary | ICD-10-CM | POA: Diagnosis not present

## 2023-10-01 DIAGNOSIS — I120 Hypertensive chronic kidney disease with stage 5 chronic kidney disease or end stage renal disease: Secondary | ICD-10-CM | POA: Diagnosis not present

## 2023-10-01 DIAGNOSIS — R932 Abnormal findings on diagnostic imaging of liver and biliary tract: Secondary | ICD-10-CM

## 2023-10-01 DIAGNOSIS — Z510 Encounter for antineoplastic radiation therapy: Secondary | ICD-10-CM | POA: Diagnosis not present

## 2023-10-01 HISTORY — PX: ERCP: SHX5425

## 2023-10-01 HISTORY — PX: BILIARY BRUSHING: SHX6843

## 2023-10-01 HISTORY — PX: STONE EXTRACTION WITH BASKET: SHX5318

## 2023-10-01 LAB — CBC WITH DIFFERENTIAL/PLATELET
Abs Immature Granulocytes: 0.05 10*3/uL (ref 0.00–0.07)
Basophils Absolute: 0 10*3/uL (ref 0.0–0.1)
Basophils Relative: 0 %
Eosinophils Absolute: 0 10*3/uL (ref 0.0–0.5)
Eosinophils Relative: 1 %
HCT: 39 % (ref 36.0–46.0)
Hemoglobin: 12.2 g/dL (ref 12.0–15.0)
Immature Granulocytes: 1 %
Lymphocytes Relative: 4 %
Lymphs Abs: 0.2 10*3/uL — ABNORMAL LOW (ref 0.7–4.0)
MCH: 28.4 pg (ref 26.0–34.0)
MCHC: 31.3 g/dL (ref 30.0–36.0)
MCV: 90.9 fL (ref 80.0–100.0)
Monocytes Absolute: 0.6 10*3/uL (ref 0.1–1.0)
Monocytes Relative: 11 %
Neutro Abs: 4.5 10*3/uL (ref 1.7–7.7)
Neutrophils Relative %: 83 %
Platelets: 94 10*3/uL — ABNORMAL LOW (ref 150–400)
RBC: 4.29 MIL/uL (ref 3.87–5.11)
RDW: 14.1 % (ref 11.5–15.5)
WBC: 5.4 10*3/uL (ref 4.0–10.5)
nRBC: 0 % (ref 0.0–0.2)

## 2023-10-01 LAB — RAD ONC ARIA SESSION SUMMARY
Course Elapsed Days: 15
Plan Fractions Treated to Date: 12
Plan Prescribed Dose Per Fraction: 2 Gy
Plan Total Fractions Prescribed: 30
Plan Total Prescribed Dose: 60 Gy
Reference Point Dosage Given to Date: 24 Gy
Reference Point Session Dosage Given: 2 Gy
Session Number: 12

## 2023-10-01 LAB — GLUCOSE, CAPILLARY
Glucose-Capillary: 136 mg/dL — ABNORMAL HIGH (ref 70–99)
Glucose-Capillary: 206 mg/dL — ABNORMAL HIGH (ref 70–99)
Glucose-Capillary: 135 mg/dL — ABNORMAL HIGH (ref 70–99)
Glucose-Capillary: 129 mg/dL — ABNORMAL HIGH (ref 70–99)

## 2023-10-01 LAB — COMPREHENSIVE METABOLIC PANEL WITH GFR
ALT: 185 U/L — ABNORMAL HIGH (ref 0–44)
AST: 61 U/L — ABNORMAL HIGH (ref 15–41)
Albumin: 2.8 g/dL — ABNORMAL LOW (ref 3.5–5.0)
Alkaline Phosphatase: 192 U/L — ABNORMAL HIGH (ref 38–126)
Anion gap: 11 (ref 5–15)
BUN: 8 mg/dL (ref 8–23)
CO2: 18 mmol/L — ABNORMAL LOW (ref 22–32)
Calcium: 8.6 mg/dL — ABNORMAL LOW (ref 8.9–10.3)
Chloride: 102 mmol/L (ref 98–111)
Creatinine, Ser: 0.41 mg/dL — ABNORMAL LOW (ref 0.44–1.00)
GFR, Estimated: >60 mL/min (ref >60)
Glucose, Bld: 140 mg/dL — ABNORMAL HIGH (ref 70–99)
Potassium: 4 mmol/L (ref 3.5–5.1)
Sodium: 131 mmol/L — ABNORMAL LOW (ref 135–145)
Total Bilirubin: 1.7 mg/dL — ABNORMAL HIGH (ref 0.0–1.2)
Total Protein: 6.1 g/dL — ABNORMAL LOW (ref 6.5–8.1)

## 2023-10-01 LAB — MAGNESIUM: Magnesium: 1.7 mg/dL (ref 1.7–2.4)

## 2023-10-01 SURGERY — ERCP, WITH INTERVENTION IF INDICATED
Anesthesia: General

## 2023-10-01 MED ORDER — DICLOFENAC SUPPOSITORY 100 MG
RECTAL | Status: AC
  Filled 2023-10-01: qty 1

## 2023-10-01 MED ORDER — LIDOCAINE 2% (20 MG/ML) 5 ML SYRINGE
INTRAMUSCULAR | Status: DC | PRN
  Administered 2023-10-01: 80 mg via INTRAVENOUS

## 2023-10-01 MED ORDER — ROCURONIUM BROMIDE 100 MG/10ML IV SOLN
INTRAVENOUS | Status: DC | PRN
  Administered 2023-10-01: 50 mg via INTRAVENOUS

## 2023-10-01 MED ORDER — DICLOFENAC SUPPOSITORY 100 MG
RECTAL | Status: DC | PRN
  Administered 2023-10-01: 100 mg via RECTAL

## 2023-10-01 MED ORDER — CIPROFLOXACIN IN D5W 400 MG/200ML IV SOLN
INTRAVENOUS | Status: DC | PRN
  Administered 2023-10-01: 400 mg via INTRAVENOUS

## 2023-10-01 MED ORDER — FENTANYL CITRATE (PF) 100 MCG/2ML IJ SOLN
INTRAMUSCULAR | Status: AC
  Filled 2023-10-01: qty 2

## 2023-10-01 MED ORDER — LACTATED RINGERS IV SOLN: INTRAVENOUS | Status: DC | PRN

## 2023-10-01 MED ORDER — GLUCAGON HCL RDNA (DIAGNOSTIC) 1 MG IJ SOLR
INTRAMUSCULAR | Status: AC
  Filled 2023-10-01: qty 1

## 2023-10-01 MED ORDER — PHENYLEPHRINE 80 MCG/ML (10ML) SYRINGE FOR IV PUSH (FOR BLOOD PRESSURE SUPPORT)
PREFILLED_SYRINGE | INTRAVENOUS | Status: DC | PRN
  Administered 2023-10-01 (×2): 80 ug via INTRAVENOUS

## 2023-10-01 MED ORDER — MIDAZOLAM HCL 2 MG/2ML IJ SOLN
INTRAMUSCULAR | Status: AC
  Filled 2023-10-01: qty 2

## 2023-10-01 MED ORDER — SUGAMMADEX SODIUM 200 MG/2ML IV SOLN
INTRAVENOUS | Status: DC | PRN
Start: 2023-10-01 — End: 2023-10-01
  Administered 2023-10-01: 300 mg via INTRAVENOUS

## 2023-10-01 MED ORDER — ONDANSETRON HCL 4 MG/2ML IJ SOLN
INTRAMUSCULAR | Status: DC | PRN
  Administered 2023-10-01: 4 mg via INTRAVENOUS

## 2023-10-01 MED ORDER — PROPOFOL 10 MG/ML IV BOLUS
INTRAVENOUS | Status: DC | PRN
  Administered 2023-10-01: 150 mg via INTRAVENOUS

## 2023-10-01 MED ORDER — FENTANYL CITRATE (PF) 100 MCG/2ML IJ SOLN
INTRAMUSCULAR | Status: DC | PRN
Start: 2023-10-01 — End: 2023-10-01
  Administered 2023-10-01: 50 ug via INTRAVENOUS

## 2023-10-01 MED ORDER — PROPOFOL 10 MG/ML IV BOLUS
INTRAVENOUS | Status: AC
  Filled 2023-10-01: qty 20

## 2023-10-01 MED ORDER — CIPROFLOXACIN IN D5W 400 MG/200ML IV SOLN
INTRAVENOUS | Status: AC
  Filled 2023-10-01: qty 200

## 2023-10-01 MED ORDER — IOHEXOL 300 MG/ML  SOLN
INTRAMUSCULAR | Status: DC | PRN
  Administered 2023-10-01: 35 mL

## 2023-10-01 NOTE — Hospital Course (Addendum)
 HPI: Pamela [name] is a 73 y.o. 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  Patient was supposed to have today but was held because of abnormal labs.  She was given IV fluids in the cancer center.  2 weeks ago, her immunosuppression regimen was changed.  She has not taken Tylenol  for 2 weeks due to elevated liver enzymes at her last appointment.  She was taking Tylenol  daily for 2 weeks prior to stopping.  No recent NSAID use.  She has noticed some jaundice.  Complains of intermittent nausea with vomiting and diarrhea for the last few days.  No vomiting, chills, night sweats, worsening shortness of breath, cough, chest pain, diarrhea, dysuria, loss of consciousness, seizures.   ED Course: Labs from today had shown AST of 220, ALT of 421, total bilirubin of 4.9 and alkaline phosphatase of 267 with lipase of 19.  CT of abdomen and pelvis with contrast showed no acute inflammatory process with moderate to severe intra and extrahepatic bile duct dilation up to the level of ampulla where, where there is gradual tapering of; no distinct obstructing mass seen with no discrete choledocholithiasis. Hospitalist service was called to evaluate the patient.  Significant Events: Admitted 09/29/2023 for elevated LFTs   Admission Labs: Na 129, K 3.2, CO2 of 22, BUN 10, Scr 0.6, glu 134 TP 6.2, alb 2.9, AST 109, ALT 272, alk phos 236, T. Bili 3.8 WBC 5.6, HgB 12.9, plt 95  Admission Imaging Studies: CT abd/pelvis No acute  inflammatory process identified within the abdomen or pelvis. 2. There is moderate to severe intra and extrahepatic bile duct dilation up to the level of Ampulla of Vater, where  here is gradual tapering. No discrete obstructing mass seen. There are subtle faintly calcified 2-3 mm calcifications in the region of Ampulla of Vater, which are likely in the pancreatic parenchyma. No discrete choledocholithiasis seen. If there is clinical concern for biliary obstruction, consider further evaluation with ERCP versus MRCP MRCP Redemonstration of marked intra and extrahepatic bile duct dilation without obstructing mass or choledocholithiasis. There is a 4 x 6 mm stone in the left main bile duct. Findings favoring ampullary stenosis. 2. There is loss of signal of liver on in phase images, in comparison to the out of phase images, as well as marked hypointense signal of liver and spleen on T2 weighted images, favoring secondary hemochromatosis.  Significant Labs: Na 129, K 3.2, CO2 of 96, BUN 10, Scr 0.5 AST 109, ALT 272, alk phos 236, T. Bili 3.8 WBC 5.6, HgB 12.9, plt 95  Significant Imaging Studies: CT abd  No acute inflammatory process identified within the abdomen or pelvis. 2. There is moderate to severe intra and extrahepatic bile duct dilation up to the level of Ampulla of Vater, where there is gradual tapering. No discrete obstructing mass seen. There are subtle faintly calcified 2-3 mm calcifications in the region of Ampulla of Vater, which are likely in the pancreatic parenchyma. No discrete choledocholithiasis seen. If there is clinical concern for biliary obstruction, consider further evaluation with ERCP versus MRCP MRCP Redemonstration of marked intra and extrahepatic bile  duct dilation without obstructing mass or choledocholithiasis. There is a 4 x 6 mm stone in the left main bile duct. Findings favoring ampullary stenosis. 2. There is loss of signal of liver on in phase images, in comparison to the  out of phase images, as well as marked hypointense signal of liver and spleen on T2 weighted images, favoring secondary hemochromatosis. 3. Multiple other nonacute observations, as described above.  Antibiotic Therapy: Anti-infectives (From admission, onward)    None       Procedures: ERCP  Consultants: GI

## 2023-10-01 NOTE — Anesthesia Postprocedure Evaluation (Signed)
 Anesthesia Post Note  Patient: Pamela [name]  Procedure(s) Performed: ERCP, WITH INTERVENTION IF INDICATED BRUSH BIOPSY, BILE DUCT ERCP, WITH LITHROTRIPSY OR REMOVAL OF COMMON BILE DUCT CALCULUS USING BASKET     Patient location during evaluation: PACU Anesthesia Type: General Level of consciousness: awake Pain management: pain level controlled Vital Signs Assessment: post-procedure vital signs reviewed and stable Respiratory status: spontaneous breathing, nonlabored ventilation and respiratory function stable Cardiovascular status: blood pressure returned to baseline and stable Postop Assessment: no apparent nausea or vomiting Anesthetic complications: no   No notable events documented.  Last Vitals:  Vitals:   10/01/23 1201 10/01/23 1206  BP: (!) 159/49 (!) 165/46  Pulse:  80  Resp:  20  Temp:    SpO2:  95%    Last Pain:  Vitals:   10/01/23 1206  TempSrc:   PainSc: 1                  Conard Decent

## 2023-10-01 NOTE — Anesthesia Procedure Notes (Signed)
 Procedure Name: Intubation Date/Time: 10/01/2023 10:51 AM  Performed by: Mervyn Ace, CRNAPre-anesthesia Checklist: Patient identified, Emergency Drugs available, Suction available, Patient being monitored and Timeout performed Patient Re-evaluated:Patient Re-evaluated prior to induction Oxygen Delivery Method: Circle system utilized Preoxygenation: Pre-oxygenation with 100% oxygen Induction Type: IV induction Ventilation: Mask ventilation without difficulty Laryngoscope Size: Mac and 3 Grade View: Grade I Tube type: Oral Tube size: 7.0 mm Number of attempts: 1 Airway Equipment and Method: Stylet Placement Confirmation: ETT inserted through vocal cords under direct vision, positive ETCO2, CO2 detector and breath sounds checked- equal and bilateral (patient edentulous) Secured at: 22 (22) cm Tube secured with: secured with pink Hy-tape. Dental Injury: Teeth and Oropharynx as per pre-operative assessment

## 2023-10-01 NOTE — Transfer of Care (Signed)
 Immediate Anesthesia Transfer of Care Note  Patient: Pamela [name]  Procedure(s) Performed: ERCP, WITH INTERVENTION IF INDICATED BRUSH BIOPSY, BILE DUCT ERCP, WITH LITHROTRIPSY OR REMOVAL OF COMMON BILE DUCT CALCULUS USING BASKET  Patient Location: Endoscopy Unit  Anesthesia Type:General  Level of Consciousness: awake, alert , and oriented  Airway & Oxygen Therapy: Patient Spontanous Breathing  Post-op Assessment: Report given to RN and Post -op Vital signs reviewed and stable  Post vital signs: Reviewed and stable  Last Vitals:  Vitals Value Taken Time  BP    Temp    Pulse    Resp    SpO2      Last Pain:  Vitals:   10/01/23 0926  TempSrc: Temporal  PainSc: 0-No pain         Complications: No notable events documented.

## 2023-10-01 NOTE — Subjective & Objective (Signed)
 Pt seen and examined after ERCP. Per GI, pt can be discharged if she is able to tolerate diet. ERCP showed no stones. Sphincterotomy performed and balloon was swept with no stones.

## 2023-10-01 NOTE — Assessment & Plan Note (Signed)
 10-01-2023 pt is already s/p cholecystectomy.  MRCP showed  Redemonstration of marked intra and extrahepatic bile duct dilation without obstructing mass or choledocholithiasis. There is a 4 x 6 mm stone in the left main bile duct. Findings favoring ampullary stenosis. Pt underwent ERCP and sphincterotomy. No stones were extracted. Pt able to tolerate soup and crackers for dinner. GI felt pt was stable for DC to home.

## 2023-10-01 NOTE — Plan of Care (Signed)

## 2023-10-01 NOTE — Discharge Summary (Signed)
 [facility] Phys[facility] Discharge Summary   Patient name: Pamela [name]  Admit date:     09/29/2023  Discharge date: 10/01/2023  Attending Physician: Audria Leather [[id number]]  Discharge Physician: Unk Garb   PCP: [name], MD  Admitted From: Home  Disposition:  Home  Recommendations for Outpatient Follow-up:  Follow up with PCP in 1-2 weeks  Home Health:No Equipment/Devices: None    Discharge Condition:Stable CODE STATUS:FULL Diet recommendation: Heart Healthy Fluid Restriction: None  Hospital Summary: HPI: Pamela [name] is a 73 y.o. 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  Patient was supposed to have today but was held because of abnormal labs.  She was given IV fluids in the cancer center.  2 weeks ago, her immunosuppression regimen was changed.  She has not taken Tylenol  for 2 weeks due to elevated liver enzymes at her last appointment.  She was taking Tylenol  daily for 2 weeks prior to stopping.  No recent NSAID use.  She has noticed some jaundice.  Complains of intermittent nausea with vomiting and diarrhea for the last few days.  No vomiting, chills, night sweats, worsening shortness of breath, cough, chest pain, diarrhea, dysuria, loss of consciousness, seizures.   ED Course: Labs from today had shown AST of 220, ALT of 421, total bilirubin of 4.9 and alkaline phosphatase of 267 with lipase of 19.  CT of abdomen and pelvis with contrast showed no acute inflammatory process  with moderate to severe intra and extrahepatic bile duct dilation up to the level of ampulla where, where there is gradual tapering of; no distinct obstructing mass seen with no discrete choledocholithiasis. Hospitalist service was called to evaluate the patient.  Significant Events: Admitted 09/29/2023 for elevated LFTs   Admission Labs: Na 129, K 3.2, CO2 of 22, BUN 10, Scr 0.6, glu 134 TP 6.2, alb 2.9, AST 109, ALT 272, alk phos 236, T. Bili 3.8 WBC 5.6, HgB 12.9, plt 95  Admission Imaging Studies: CT abd/pelvis No acute inflammatory process identified within the abdomen or pelvis. 2. There is moderate to severe intra and extrahepatic bile duct dilation up to the level of Ampulla of Vater, where  here is gradual tapering. No discrete obstructing mass seen. There are subtle faintly calcified 2-3 mm calcifications in the region of Ampulla of Vater, which are likely in the pancreatic parenchyma. No discrete choledocholithiasis seen. If there is clinical concern for biliary obstruction, consider further evaluation with ERCP versus MRCP MRCP Redemonstration of marked intra and extrahepatic bile duct dilation without obstructing mass or choledocholithiasis. There is a 4 x 6 mm stone in the left main bile duct. Findings favoring ampullary stenosis. 2. There is loss of signal of liver on in phase images, in comparison to the out of phase images, as well as marked hypointense signal of liver and spleen on T2 weighted images, favoring secondary hemochromatosis.  Significant Labs: Na 129, K 3.2, CO2 of 96, BUN 10, Scr 0.5 AST 109, ALT 272, alk phos 236, T. Bili 3.8 WBC 5.6, HgB 12.9, plt 95  Significant Imaging Studies: CT abd  No acute inflammatory process identified  within the abdomen or pelvis. 2. There is moderate to severe intra and extrahepatic bile duct dilation up to the level of Ampulla of Vater, where there is gradual tapering. No discrete obstructing mass seen. There are subtle faintly  calcified 2-3 mm calcifications in the region of Ampulla of Vater, which are likely in the pancreatic parenchyma. No discrete choledocholithiasis seen. If there is clinical concern for biliary obstruction, consider further evaluation with ERCP versus MRCP MRCP Redemonstration of marked intra and extrahepatic bile duct dilation without obstructing mass or choledocholithiasis. There is a 4 x 6 mm stone in the left main bile duct. Findings favoring ampullary stenosis. 2. There is loss of signal of liver on in phase images, in comparison to the out of phase images, as well as marked hypointense signal of liver and spleen on T2 weighted images, favoring secondary hemochromatosis. 3. Multiple other nonacute observations, as described above.  Antibiotic Therapy: Anti-infectives (From admission, onward)    None       Procedures: ERCP  Consultants: GI   Hospital Course by Problem: * Elevated LFTs 10-01-2023 pt is already s/p cholecystectomy.  MRCP showed  Redemonstration of marked intra and extrahepatic bile duct dilation without obstructing mass or choledocholithiasis. There is a 4 x 6 mm stone in the left main bile duct. Findings favoring ampullary stenosis. Pt underwent ERCP and sphincterotomy. No stones were extracted. Pt able to tolerate soup and crackers for dinner. GI felt pt was stable for DC to home.   Biliary tract imaging abnormality 10-01-2023 pt is already s/p cholecystectomy.  MRCP showed  Redemonstration of marked intra and extrahepatic bile duct dilation without obstructing mass or choledocholithiasis. There is a 4 x 6 mm stone in the left main bile duct. Findings favoring ampullary stenosis. Pt underwent ERCP and sphincterotomy. No stones were extracted. Pt able to tolerate soup and crackers for dinner. GI felt pt was stable for DC to home.    Discharge Diagnoses:  Principal Problem:   Elevated LFTs Active Problems:   Biliary tract imaging abnormality   Discharge  Instructions  Discharge Instructions     Diet - low sodium heart healthy   Complete by: As directed    Discharge instructions   Complete by: As directed    1. Follow up with your primary care provider in 1-2 weeks following discharge from hospital.   Increase activity slowly   Complete by: As directed       Allergies as of 10/01/2023   No Known Allergies      Medication List     STOP taking these medications    Fluad Quadrivalent 0.5 ML injection Generic drug: influenza vaccine adjuvanted   Pfizer COVID-19 Vac Bivalent injection Generic drug: COVID-19 mRNA bivalent vaccine Proofreader)   Pfizer-BioNT COVID-19 Vac-TriS Susp injection Generic drug: COVID-19 mRNA Vac-TriS Proofreader)       TAKE these medications    Accu-Chek Aviva Plus test strip Generic drug: glucose blood use as directed to check  blood sugar 4 times daily   Accu-Chek Aviva Soln DX: E11.21 as directed   Accu-Chek Softclix Lancets lancets DX: E11.21 as directed   albuterol  108 (90 Base) MCG/ACT inhaler Commonly known as: VENTOLIN  HFA Inhale 2 puffs into the lungs See admin instructions. Inhale 2 puffs into the lungs in the morning and at bedtime- may use an additional 2 puffs twice a day as needed for shortness of breath or wheezing   amLODipine  10 MG tablet Commonly known as: NORVASC  Take 10 mg by  mouth daily.   aspirin  EC 81 MG tablet Take 81 mg by mouth daily.   atorvastatin  10 MG tablet Commonly known as: LIPITOR Take 1 tablet (10 mg total) by mouth daily. What changed: when to take this   carvedilol  12.5 MG tablet Commonly known as: COREG  Take 1 tablet (12.5 mg total) by mouth 2 (two) times daily. What changed: Another medication with the same name was removed. Continue taking this medication, and follow the directions you see here.   glimepiride  2 MG tablet Commonly known as: AMARYL  Take 2 mg by mouth daily.   insulin  lispro 100 UNIT/ML KiwkPen Commonly known as: HUMALOG Inject  8-13 Units into the skin See admin instructions. Inject 8-13 units into the skin three times a day with meals, PER SLIDING SCALE (when able to eat and tolerate)   Lantus  SoloStar 100 UNIT/ML Solostar Pen Generic drug: insulin  glargine Inject 22 Units into the skin in the morning.   latanoprost  0.005 % ophthalmic solution Commonly known as: XALATAN  Place 1 drop into both eyes at bedtime.   nitroGLYCERIN  0.4 MG SL tablet Commonly known as: NITROSTAT  Place 0.4 mg under the tongue every 5 (five) minutes as needed for chest pain.   ondansetron  8 MG tablet Commonly known as: Zofran  Take 1 tablet (8 mg total) by mouth every 8 (eight) hours as needed for nausea or vomiting. Start on the third day after chemotherapy.   oxyCODONE -acetaminophen  5-325 MG tablet Commonly known as: PERCOCET/ROXICET Take 1 tablet by mouth every 6 (six) hours as needed for severe pain (pain score 7-10).   predniSONE  5 MG tablet Commonly known as: DELTASONE  Take 5 mg by mouth daily with breakfast.   prochlorperazine  10 MG tablet Commonly known as: COMPAZINE  Take 1 tablet (10 mg total) by mouth every 6 (six) hours as needed for nausea or vomiting.   sirolimus 1 MG tablet Commonly known as: RAPAMUNE Take 2 mg by mouth daily.   tacrolimus  ER 1 MG Tb24 Commonly known as: ENVARSUS  XR Take 1 mg by mouth daily.   umeclidinium-vilanterol 62.5-25 MCG/INH Aepb Commonly known as: ANORO ELLIPTA Inhale 1 puff into the lungs daily.        No Known Allergies  Discharge Exam: Vitals:   10/01/23 1201 10/01/23 1206  BP: (!) 159/49 (!) 165/46  Pulse:  80  Resp:  20  Temp:    SpO2:  95%    Physical Exam Vitals and nursing note reviewed.  Constitutional:      General: She is not in acute distress.    Appearance: She is not toxic-appearing or diaphoretic.  HENT:     Head: Atraumatic.     Nose: Nose normal.  Cardiovascular:     Rate and Rhythm: Normal rate and regular rhythm.  Pulmonary:     Effort:  Pulmonary effort is normal.  Abdominal:     General: Bowel sounds are normal.     Palpations: Abdomen is soft.  Skin:    General: Skin is warm and dry.     Capillary Refill: Capillary refill takes less than 2 seconds.  Neurological:     Mental Status: She is alert and oriented to person, place, and time.     The results of significant diagnostics from this hospitalization (including imaging, microbiology, ancillary and laboratory) are listed below for reference.    Labs: Basic Metabolic Panel: Recent Labs  Lab 09/29/23 0731 09/30/23 0416 10/01/23 0427  NA 129* 129* 131*  K 3.8 3.2* 4.0  CL 94* 96* 102  CO2 23 22 18*  GLUCOSE 174* 134* 140*  BUN 15 10 8   CREATININE 0.99 0.60 0.41*  CALCIUM  8.6* 8.3* 8.6*  MG  --  1.7 1.7   Liver Function Tests: Recent Labs  Lab 09/29/23 0731 09/30/23 0416 10/01/23 0427  AST 220* 109* 61*  ALT 421* 272* 185*  ALKPHOS 267* 236* 192*  BILITOT 4.9* 3.8* 1.7*  PROT 6.7 6.2* 6.1*  ALBUMIN 3.8 2.9* 2.8*   Recent Labs  Lab 09/29/23 1113  LIPASE 19   CBC: Recent Labs  Lab 09/29/23 0731 09/30/23 0416 10/01/23 0427  WBC 7.6 5.6 5.4  NEUTROABS 6.8  --  4.5  HGB 14.4 12.9 12.2  HCT 42.5 39.6 39.0  MCV 83.8 87.0 90.9  PLT 113* 95* 94*   CBG: Recent Labs  Lab 09/30/23 2206 10/01/23 0341 10/01/23 0737 10/01/23 1156 10/01/23 1230  GLUCAP 91 136* 206* 135* 129*   Hgb A1c Recent Labs    09/30/23 0416  HGBA1C 7.4*   Urinalysis    Component Value Date/Time   COLORURINE YELLOW 01/21/2015 0900   APPEARANCEUR CLOUDY (A) 01/21/2015 0900   LABSPEC 1.007 01/21/2015 0900   PHURINE 5.5 01/21/2015 0900   GLUCOSEU NEGATIVE 01/21/2015 0900   HGBUR MODERATE (A) 01/21/2015 0900   BILIRUBINUR NEGATIVE 01/21/2015 0900   KETONESUR NEGATIVE 01/21/2015 0900   PROTEINUR 100 (A) 01/21/2015 0900   UROBILINOGEN 0.2 01/21/2015 0900   NITRITE NEGATIVE 01/21/2015 0900   LEUKOCYTESUR TRACE (A) 01/21/2015 0900   Sepsis Labs Recent Labs   Lab 09/29/23 0731 09/30/23 0416 10/01/23 0427  WBC 7.6 5.6 5.4    Procedures/Studies: MR ABDOMEN MRCP WO CONTRAST Result Date: 09/29/2023 CLINICAL DATA:  Jaundice; Jaundice [id number] Pain 144615. EXAM: MRI ABDOMEN WITHOUT CONTRAST  (INCLUDING MRCP) TECHNIQUE: Multiplanar multisequence MR imaging of the abdomen was performed. Heavily T2-weighted images of the biliary and pancreatic ducts were obtained, and three-dimensional MRCP images were rendered by post processing. COMPARISON:  CT scan abdomen and pelvis from earlier the same day. FINDINGS: Lower chest: Unremarkable MR appearance to the lung bases. No pleural effusion. No pericardial effusion. Normal heart size. Hepatobiliary: The liver is normal in size. Noncirrhotic configuration. No focal lesion. 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 Redemonstration of marked intra and extrahepatic bile duct dilation. Extrahepatic bile duct measures up to 1.9 cm in the proximal portion, 1.4 cm in the midportion and 1.2 cm in the distal portion. It gradually tapers near the ampulla of Vater. No discrete choledocholithiasis seen. And incidental 4 x 6 mm stone is seen in the left main bile duct (series 11, image 14). Surgically absent gallbladder. Pancreas: Small/atrophic pancreas. No focal mass. Main pancreatic duct is not dilated. No peripancreatic fat stranding. Spleen:  Within normal limits in size and appearance. No focal mass. Adrenals/Urinary Tract: Unremarkable adrenal glands. Small/atrophic bilateral kidneys noted exhibiting multiple simple cysts with largest arising from the right kidney upper pole measuring 1.6 x 1.7 cm. There is a partially exophytic structure arising from the left kidney lower pole measuring approximately 1.3 x 1.5 cm, which appears mildly hyperintense on T1  weighted images and mildly hypointense on T2 weighted images. This is favored to represent proteinaceous/hemorrhagic cyst. No hydroureteronephrosis. 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 Partially seen urinary bladder, which is within normal limits. Stomach/Bowel: There is  a small sliding hiatal hernia. Visualized portions within the abdomen are unremarkable. No disproportionate dilation of bowel loops. Vascular/Lymphatic: No pathologically enlarged lymph nodes identified. No abdominal aortic aneurysm demonstrated. No ascites. Other:  None. Musculoskeletal: No suspicious bone lesions identified. IMPRESSION: 1. Redemonstration of marked intra and extrahepatic bile duct dilation without obstructing mass or choledocholithiasis. There is a 4 x 6 mm stone in the left main bile duct. Findings favoring ampullary stenosis. 2. There is loss of signal of liver on in phase images, in comparison to the out of phase images, as well as marked hypointense signal of liver and spleen on T2 weighted images, favoring secondary hemochromatosis. 3. Multiple other nonacute observations, as described above. Electronically Signed   By: Beula Brunswick M.D.   On: 09/29/2023 15:34   MR 3D Recon At Scanner Result Date: 09/29/2023 CLINICAL DATA:  Jaundice; Jaundice [id number] Pain 144615. EXAM: MRI ABDOMEN WITHOUT CONTRAST  (INCLUDING MRCP) TECHNIQUE: Multiplanar multisequence MR imaging of the abdomen was performed. Heavily T2-weighted images of the biliary and pancreatic ducts were obtained, and three-dimensional MRCP images were rendered by post processing. COMPARISON:  CT scan abdomen and pelvis from earlier the same day. FINDINGS: Lower chest: Unremarkable MR appearance to the lung bases. No pleural effusion. No pericardial effusion. Normal heart size. Hepatobiliary: The liver is normal in size. Noncirrhotic configuration. No focal lesion. There is loss of signal of liver on in phase images, in comparison to the  out of phase images, as well as marked hypointense signal of liver and spleen on T2 weighted images, favoring hemochromatosis most likely secondary. Redemonstration of marked intra and extrahepatic bile duct dilation. Extrahepatic bile duct measures up to 1.9 cm in the proximal portion, 1.4 cm in the midportion and 1.2 cm in the distal portion. It gradually tapers near the ampulla of Vater. No discrete choledocholithiasis seen. And incidental 4 x 6 mm stone is seen in the left main bile duct (series 11, image 14). Surgically absent gallbladder. Pancreas: Small/atrophic pancreas. No focal mass. Main pancreatic duct is not dilated. No peripancreatic fat stranding. Spleen:  Within normal limits in size and appearance. No focal mass. Adrenals/Urinary Tract: Unremarkable adrenal glands. Small/atrophic bilateral kidneys noted exhibiting multiple simple cysts with largest arising from the right kidney upper pole measuring 1.6 x 1.7 cm. There is a partially exophytic structure arising from the left kidney lower pole measuring approximately 1.3 x 1.5 cm, which appears mildly hyperintense on T1 weighted images and mildly hypointense on T2 weighted images. This is favored to represent proteinaceous/hemorrhagic cyst. No hydroureteronephrosis. 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 Partially seen urinary bladder, which is within normal limits. Stomach/Bowel: There is a small sliding hiatal hernia. Visualized portions within the abdomen are unremarkable. No disproportionate dilation of bowel loops. Vascular/Lymphatic: No pathologically enlarged lymph nodes identified. No abdominal aortic aneurysm demonstrated. No ascites. Other:  None. Musculoskeletal: No suspicious bone lesions identified. IMPRESSION: 1. Redemonstration of marked intra and extrahepatic bile duct dilation without obstructing mass or choledocholithiasis. There is a 4 x 6 mm stone in the left main bile duct. Findings favoring ampullary  stenosis. 2. There is loss of signal of liver on in phase images, in comparison to the out of phase images, as well as marked hypointense signal of liver and spleen on T2 weighted images, favoring secondary hemochromatosis. 3. Multiple other nonacute observations, as described above. Electronically Signed   By: Beula Brunswick M.D.   On: 09/29/2023 15:34   CT ABDOMEN PELVIS W CONTRAST Result  Date: 09/29/2023 CLINICAL DATA:  Abdominal pain, acute, nonlocalized. * Tracking Code: BO * EXAM: CT ABDOMEN AND PELVIS WITH CONTRAST TECHNIQUE: Multidetector CT imaging of the abdomen and pelvis was performed using the standard protocol following bolus administration of intravenous contrast. RADIATION DOSE REDUCTION: This exam was performed according to the departmental dose-optimization program which includes automated exposure control, adjustment of the mA and/or kV according to patient size and/or use of iterative reconstruction technique. CONTRAST:  OMNIPAQUE  IOHEXOL  300 MG/ML  SOLN COMPARISON:  CT scan abdomen and pelvis from 02/01/2016 and PET-CT scan from 09/04/2023. FINDINGS: Lower chest: The lung bases are clear. No pleural effusion. The heart is normal in size. No pericardial effusion. Hepatobiliary: The liver is normal in size. Non-cirrhotic configuration. No suspicious mass. These is mild diffuse hepatic steatosis. There is moderate to severe intra and extrahepatic bile duct dilation up to the level of ampulla of Vater, where there is gradual tapering. No discrete obstructing mass seen. There are subtle faintly calcified 2, 2-3 mm calcifications in the region of ampulla of Vater, which are likely in the pancreatic parenchyma. No discrete choledocholithiasis seen. Gallbladder is surgically absent. Pancreas: Small/atrophic pancreas. There are multiple dystrophic calcifications mainly in the pancreatic head/uncinate process region, likely sequela of chronic pancreatitis. No suspicious mass or peripancreatic fat  stranding. Main pancreatic duct is not dilated. Spleen: Within normal limits. No focal lesion. Adrenals/Urinary Tract: Stable bilateral adrenal glands. Redemonstration of small/atrophic bilateral native kidneys. There is a mildly complex cyst arising from the left kidney lower pole measuring at least 1.2 x 1.5 cm, exhibiting single 1-2 mm sized calcified septum. There is a smaller simple cyst arising from the right kidney upper pole, posteriorly. No hydroureteronephrosis on either side. There are several sub 5 mm nonobstructing calculi in bilateral kidneys. No ureterolithiasis. 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 Unremarkable urinary bladder. Stomach/Bowel: There is small sliding hiatal hernia. No disproportionate dilation of the small or large bowel loops. No evidence of abnormal bowel wall thickening or inflammatory changes. The appendix was not visualized; however there is no acute inflammatory process in the right lower quadrant. There are multiple diverticula mainly in the sigmoid colon, without imaging signs of diverticulitis. Vascular/Lymphatic: No ascites or pneumoperitoneum. No abdominal or pelvic lymphadenopathy, by size criteria. No aneurysmal dilation of the major abdominal arteries. There are moderate peripheral atherosclerotic vascular calcifications of the aorta and its major branches. Reproductive: The uterus is surgically absent. No large adnexal mass. Other: There is a tiny fat containing umbilical hernia. The soft tissues and abdominal wall are otherwise unremarkable. Musculoskeletal: No suspicious osseous lesions. There are mild - moderate multilevel degenerative changes in the visualized spine. IMPRESSION: 1. No acute inflammatory process identified within the abdomen or pelvis. 2. There is moderate to severe intra and extrahepatic bile duct dilation up to the level of Ampulla of Vater, where there is gradual tapering. No discrete obstructing mass seen.  There are subtle faintly calcified 2-3 mm calcifications in the region of Ampulla of Vater, which are likely in the pancreatic parenchyma. No discrete choledocholithiasis seen. If there is clinical concern for biliary obstruction, consider further evaluation with ERCP versus MRCP. 3. Multiple other nonacute observations, as described above. Aortic Atherosclerosis (ICD10-I70.0). Electronically Signed   By: Beula Brunswick M.D.   On: 09/29/2023 13:41   NM PET Image Initial (PI) Skull Base To Thigh Result Date: 09/05/2023 CLINICAL DATA:  Initial treatment strategy for lung nodule. EXAM: NUCLEAR MEDICINE PET SKULL BASE TO THIGH TECHNIQUE:  8.40 mCi F-18 FDG was injected intravenously. Full-ring PET imaging was performed from the skull base to thigh after the radiotracer. CT data was obtained and used for attenuation correction and anatomic localization. Fasting blood glucose: 198 mg/dl COMPARISON:  Chest CT with contrast 08/08/2023. FINDINGS: Mediastinal blood pool activity: SUV max 3.5 Liver activity: SUV max 4.0. Neck: Mild physiological pharyngeal uptake. Near symmetric uptake of the visualized intracranial compartment. No specific abnormal uptake elsewhere in the neck including along lymph node change of the submandibular, posterior triangle or internal jugular regions. Incidental CT findings: Visualized portions of the paranasal sinuses and mastoid air cells are clear. The parotid glands, submandibular glands are unremarkable. There is a cystic focus in the left thyroid lobe which again does not show any abnormal uptake but measures up to 18 mm. Confirmatory ultrasound could be considered when clinically appropriate. Scattered vascular calcifications are seen. Chest: Centered in the superior segment of the left lower lobe once again is a large cavitary mass as seen on CT scan. On the prior CT this measured 6.8 x 5.9 cm. Today this has maximum SUV value 11.2. The uptake is along the periphery of lesion most  confluent along the inferior aspect as well as medial superiorly. Lesion once again extends from the pleura to the hilum. There is an occluded left lower lobe bronchus identified once again seen such as series CT image 67. No additional areas of abnormal uptake identified in the lungs. There is slight asymmetric uptake seen along the adjacent left lower lobe lung hilum posteriorly the small focus of uptake maximum SUV of 3.7 on image 66. This corresponds to the hilar node described on the CT scan at 12 mm. No other areas of abnormal uptake above blood pool in the axillary region, hilum or mediastinum. Incidental CT findings: Breathing motion. Emphysematous lung changes are identified. No pleural effusion or pneumothorax. No consolidation. There is a vascular stent along the left upper extremity. Coronary artery calcifications are seen. Trace pericardial fluid. The heart is nonenlarged. Slightly patulous thoracic esophagus. Abdomen/pelvis: There is physiologic distribution radiotracer along the parenchymal organs, bowel and renal collecting systems. Incidental CT findings: Diffuse vascular calcifications are identified. Speckled calcifications in the area of the uncinate process of the pancreas. Severe atrophy of the native kidneys. 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 There is some contrast in the collecting system of the transplant kidney in the bladder. Please correlate with prior MRI of the head. Bowel is nondilated. Scattered stool. Few colonic diverticula. SKELETON: No abnormal uptake along the visualized osseous structures. Incidental CT findings: Scattered degenerative changes particularly of the spine and pelvis. IMPRESSION: As seen on CT there is a large cavitary mass centered along the superior segment left lower lobe abutting the hilum with marginal abnormal radiotracer uptake. Focal uptake seen along the mildly enlarged left hilar node as seen on prior CT scan could be a regional metastasis. No  additional areas of abnormal radiotracer uptake to suggest more distant spread of disease. Left thyroid cystic lesion is not hypermetabolic. Follow up ultrasound could be performed when appropriate. 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 Electronically Signed   By: Adrianna Horde M.D.   On: 09/05/2023 10:39   MR BRAIN W WO CONTRAST Result Date: 09/04/2023 CLINICAL DATA:  Lung cancer staging EXAM: MRI HEAD WITHOUT AND WITH CONTRAST TECHNIQUE: Multiplanar, multiecho pulse sequences of the brain and surrounding structures were obtained without and with  intravenous contrast. CONTRAST:  7mL GADAVIST  GADOBUTROL  1 MMOL/ML IV SOLN COMPARISON:  None Available. FINDINGS: Brain: Small foci of diffusion restriction at the superior right frontal cortex and in the left centrum semiovale to subcortical frontal white matter. These are somewhat weakly restricted but no enhancement typical of subacute infarcts. No abnormal enhancement throughout the brain to suggest metastatic disease. No hemorrhage, hydrocephalus, or collection. Vascular: Major flow voids and vascular enhancements are preserved Skull and upper cervical spine: Normal marrow signal Sinuses/Orbits: Negative Other: These results will be called to the ordering clinician or representative by the Radiologist Assistant, and communication documented in the PACS or [employer]. 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 No abnormal enhancement to suggest metastatic disease. Electronically Signed   By: Ronnette Coke M.D.   On: 09/04/2023 10:03    Time coordinating discharge: 60 mins  SIGNED:  Unk Garb, DO [facility] 10/01/23, 3:14 PM

## 2023-10-01 NOTE — Progress Notes (Signed)
 PROGRESS NOTE    Pamela [name]  ZOX:[id number] DOB: [birth year] DOA: 09/29/2023 PCP: [name], MD  Subjective: Pt seen and examined after ERCP. Per GI, pt can be discharged if she is able to tolerate diet. ERCP showed no stones. Sphincterotomy performed and balloon was swept with no stones.   Hospital Course: HPI: Pamela [name] is a 73 y.o. 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  Patient was supposed to have today but was held because of abnormal labs.  She was given IV fluids in the cancer center.  2 weeks ago, her immunosuppression regimen was changed.  She has not taken Tylenol  for 2 weeks due to elevated liver enzymes at her last appointment.  She was taking Tylenol  daily for 2 weeks prior to stopping.  No recent NSAID use.  She has noticed some jaundice.  Complains of intermittent nausea with vomiting and diarrhea for the last few days.  No vomiting, chills, night sweats, worsening shortness of breath, cough, chest pain, diarrhea, dysuria, loss of consciousness, seizures.   ED Course: Labs from today had shown AST of 220, ALT of 421, total bilirubin of 4.9 and alkaline phosphatase of 267 with lipase of 19.  CT of abdomen and pelvis with contrast showed no acute inflammatory process with moderate to severe intra and extrahepatic bile duct dilation up to the level of ampulla where, where there is gradual tapering of; no distinct obstructing mass seen with no discrete choledocholithiasis. Hospitalist  service was called to evaluate the patient.  Significant Events: Admitted 09/29/2023 for elevated LFTs   Admission Labs: Na 129, K 3.2, CO2 of 22, BUN 10, Scr 0.6, glu 134 TP 6.2, alb 2.9, AST 109, ALT 272, alk phos 236, T. Bili 3.8 WBC 5.6, HgB 12.9, plt 95  Admission Imaging Studies: CT abd/pelvis No acute inflammatory process identified within the abdomen or pelvis. 2. There is moderate to severe intra and extrahepatic bile duct dilation up to the level of Ampulla of Vater, where  here is gradual tapering. No discrete obstructing mass seen. There are subtle faintly calcified 2-3 mm calcifications in the region of Ampulla of Vater, which are likely in the pancreatic parenchyma. No discrete choledocholithiasis seen. If there is clinical concern for biliary obstruction, consider further evaluation with ERCP versus MRCP MRCP Redemonstration of marked intra and extrahepatic bile duct dilation without obstructing mass or choledocholithiasis. There is a 4 x 6 mm stone in the left main bile duct. Findings favoring ampullary stenosis. 2. There is loss of signal of liver on in phase images, in comparison to the out of phase images, as well as marked hypointense signal of liver and spleen on T2 weighted images, favoring secondary hemochromatosis.  Significant Labs: Na 129, K 3.2, CO2 of 96, BUN 10, Scr 0.5 AST 109, ALT 272, alk phos 236, T. Bili 3.8 WBC 5.6, HgB 12.9, plt 95  Significant Imaging Studies: CT abd  No acute inflammatory process identified within the abdomen or pelvis. 2. There is moderate to severe intra and extrahepatic bile duct dilation up to the level of Ampulla of Vater, where there is  gradual tapering. No discrete obstructing mass seen. There are subtle faintly calcified 2-3 mm calcifications in the region of Ampulla of Vater, which are likely in the pancreatic parenchyma. No discrete choledocholithiasis seen. If there is clinical concern for biliary obstruction, consider further  evaluation with ERCP versus MRCP MRCP Redemonstration of marked intra and extrahepatic bile duct dilation without obstructing mass or choledocholithiasis. There is a 4 x 6 mm stone in the left main bile duct. Findings favoring ampullary stenosis. 2. There is loss of signal of liver on in phase images, in comparison to the out of phase images, as well as marked hypointense signal of liver and spleen on T2 weighted images, favoring secondary hemochromatosis. 3. Multiple other nonacute observations, as described above.  Antibiotic Therapy: Anti-infectives (From admission, onward)    None       Procedures: ERCP  Consultants: GI    Assessment and Plan: * Elevated LFTs 10-01-2023 pt is already s/p cholecystectomy.  MRCP showed  Redemonstration of marked intra and extrahepatic bile duct dilation without obstructing mass or choledocholithiasis. There is a 4 x 6 mm stone in the left main bile duct. Findings favoring ampullary stenosis. Pt underwent ERCP and sphincterotomy. No stones were extracted. Pt able to tolerate soup and crackers for dinner. GI felt pt was stable for DC to home.   Biliary tract imaging abnormality 10-01-2023 pt is already s/p cholecystectomy.  MRCP showed  Redemonstration of marked intra and extrahepatic bile duct dilation without obstructing mass or choledocholithiasis. There is a 4 x 6 mm stone in the left main bile duct. Findings favoring ampullary stenosis. Pt underwent ERCP and sphincterotomy. No stones were extracted. Pt able to tolerate soup and crackers for dinner. GI felt pt was stable for DC to home.   DVT prophylaxis: enoxaparin  (LOVENOX ) injection 40 mg Start: 09/29/23 2200    Code Status: Full Code Family Communication: discussed with pt and husband at bedside Disposition Plan: return home Reason for continuing need for hospitalization: stable for DC today.  Objective: Vitals:   10/01/23 1148 10/01/23 1156 10/01/23 1201 10/01/23 1206  BP: (!) 143/41 (!)  157/43 (!) 159/49 (!) 165/46  Pulse: 78 78  80  Resp: (!) 22 (!) 21  20  Temp: 97.8 F (36.6 C)     TempSrc: Temporal     SpO2: 97%   95%  Weight:      Height:        Intake/Output Summary (Last 24 hours) at 10/01/2023 1513 Last data filed at 10/01/2023 1147 Gross per 24 hour  Intake 1060 ml  Output 1 ml  Net 1059 ml   Filed Weights   09/29/23 1025 09/30/23 0456 10/01/23 0500  Weight: 73.9 kg 75.2 kg 75.2 kg    Examination:  Physical Exam Vitals and nursing note reviewed.  Constitutional:      General: She is not in acute distress.    Appearance: She is not toxic-appearing or diaphoretic.  HENT:     Head: Atraumatic.     Nose: Nose normal.  Cardiovascular:     Rate and Rhythm: Normal rate and regular rhythm.  Pulmonary:     Effort: Pulmonary effort is normal.  Abdominal:     General: Bowel sounds are normal.     Palpations: Abdomen is soft.  Skin:    General: Skin is warm and dry.     Capillary Refill: Capillary refill takes less than 2 seconds.  Neurological:     Mental Status: She is alert and oriented  to person, place, and time.     Data Reviewed: I have personally reviewed following labs and imaging studies  CBC: Recent Labs  Lab 09/29/23 0731 09/30/23 0416 10/01/23 0427  WBC 7.6 5.6 5.4  NEUTROABS 6.8  --  4.5  HGB 14.4 12.9 12.2  HCT 42.5 39.6 39.0  MCV 83.8 87.0 90.9  PLT 113* 95* 94*   Basic Metabolic Panel: Recent Labs  Lab 09/29/23 0731 09/30/23 0416 10/01/23 0427  NA 129* 129* 131*  K 3.8 3.2* 4.0  CL 94* 96* 102  CO2 23 22 18*  GLUCOSE 174* 134* 140*  BUN 15 10 8   CREATININE 0.99 0.60 0.41*  CALCIUM  8.6* 8.3* 8.6*  MG  --  1.7 1.7   GFR: Estimated Creatinine Clearance: 58.8 mL/min (A) (by C-G formula based on SCr of 0.41 mg/dL (L)). Liver Function Tests: Recent Labs  Lab 09/29/23 0731 09/30/23 0416 10/01/23 0427  AST 220* 109* 61*  ALT 421* 272* 185*  ALKPHOS 267* 236* 192*  BILITOT 4.9* 3.8* 1.7*  PROT 6.7 6.2* 6.1*   ALBUMIN 3.8 2.9* 2.8*   Recent Labs  Lab 09/29/23 1113  LIPASE 19   HbA1C: Recent Labs    09/30/23 0416  HGBA1C 7.4*   CBG: Recent Labs  Lab 09/30/23 2206 10/01/23 0341 10/01/23 0737 10/01/23 1156 10/01/23 1230  GLUCAP 91 136* 206* 135* 129*   Scheduled Meds:  carvedilol   12.5 mg Oral BID WC   enoxaparin  (LOVENOX ) injection  40 mg Subcutaneous Q24H   feeding supplement  1 Container Oral TID BM   insulin  aspart  0-15 Units Subcutaneous TID WC   insulin  aspart  0-5 Units Subcutaneous QHS   predniSONE   5 mg Oral Q breakfast   sirolimus  2 mg Oral Daily   tacrolimus  ER  1 mg Oral Daily   Continuous Infusions:  sodium chloride  75 mL/hr at 09/30/23 1813     LOS: 1 day   Time spent: 45 minutes  Unk Garb, DO  [facility]  10/01/2023, 3:13 PM

## 2023-10-01 NOTE — Interval H&P Note (Signed)
 History and Physical Interval Note: 73 year old female with elevated LFTs and abnormal imaging concerning for ampullary stenosis for ERCP with propofol .  10/01/2023 9:14 AM  Pamela [name]  has presented today for ERC P with propofol  with the diagnosis of Elevated liver function tests; Abnormal imaging.  The various methods of treatment have been discussed with the patient and family. After consideration of risks, benefits and other options for treatment, the patient has consented to  Procedure(s): ERCP, WITH INTERVENTION IF INDICATED (N/A) as a surgical intervention.  The patient's history has been reviewed, patient examined, no change in status, stable for surgery.  I have reviewed the patient's chart and labs.  Questions were answered to the patient's satisfaction.     Pamela [name]

## 2023-10-01 NOTE — Op Note (Signed)
 [facility] Patient Name: Pamela [name] Procedure Date: 10/01/2023 MRN: [id number] Attending MD: Genell Ken , MD, [id number] Date of Birth: November 29, 1950 CSN: [id number] Age: 74 Admit Type: Inpatient Procedure:                ERCP Indications:              Abnormal MRCP, Elevated liver enzymes Providers:                Genell Ken, MD, Nikki Barters RN, RN, Marvelyn Slim, Technician Referring MD:             [facility] Medicines:                Monitored Anesthesia Care Complications:            No immediate complications. Estimated Blood Loss:     Estimated blood loss: none. Procedure:                Pre-Anesthesia Assessment:                           - Prior to the procedure, a History and Physical                            was performed, and patient medications and                            allergies were reviewed. The patient's tolerance of                            previous anesthesia was also reviewed. The risks                            and benefits of the procedure and the sedation                            options and risks were discussed with the patient.                            All questions were answered, and informed consent                            was obtained. Prior Anticoagulants: The patient has                            taken no anticoagulant or antiplatelet agents. ASA                            Grade Assessment: III - A patient with severe                            systemic disease. After reviewing the risks and  benefits, the patient was deemed in satisfactory                            condition to undergo the procedure.                           After obtaining informed consent, the scope was                            passed under direct vision. Throughout the                            procedure, the patient's blood pressure, pulse, and                            oxygen  saturations were monitored continuously. The                            [id number]-[id number] ([id number] Olympus duodenoscope was                            introduced through the mouth, and used to inject                            contrast into and used to inject contrast into the                            bile duct. The ERCP was accomplished without                            difficulty. The patient tolerated the procedure                            well. Scope In: Scope Out: Findings:      The esophagus was successfully intubated under direct vision. The scope       was advanced to a normal major papilla in the descending duodenum       without detailed examination of the pharynx, larynx and associated       structures, and upper GI tract. The upper GI tract was grossly normal.      The bile duct was deeply cannulated with the sphincterotome. Contrast       was injected. I personally interpreted the bile duct images. There was       brisk flow of contrast through the ducts. Image quality was excellent.       Contrast extended to the main bile duct. The main bile duct was severely       dilated.      A straight Roadrunner wire was passed into the biliary tree.      A 10 mm biliary sphincterotomy was made with a monofilament       sphincterotome using ERBE electrocautery. There was no       post-sphincterotomy bleeding. The biliary tree was swept with a 15 mm,       12 mm and a 9 mm balloon starting at the bifurcation. Nothing was found.  Cells for cytology were obtained by brushing in the lower third of the       main bile duct.      The pancreatic duct was not canulate or injected during the procedure. Impression:               - The entire main bile duct was severely dilated.                           - A biliary sphincterotomy was performed.                           - The biliary tree was swept and nothing was found.                           - Cells for cytology obtained in the lower  third of                            the main duct. Moderate Sedation:      Patient did not receive moderate sedation for this procedure, but       instead received monitored anesthesia care. Recommendation:           - Resume regular diet.                           - Await cytology results. Procedure Code(s):        --- Professional ---                           [phone], Endoscopic retrograde                            cholangiopancreatography (ERCP); with                            sphincterotomy/papillotomy                           [phone], Endoscopic catheterization of the biliary                            ductal system, radiological supervision and                            interpretation Diagnosis Code(s):        --- Professional ---                           R74.8, Abnormal levels of other serum enzymes                           K83.8, Other specified diseases of biliary tract                           R93.2, Abnormal findings on diagnostic imaging of                            liver  and biliary tract CPT copyright 2022 [employer]. All rights reserved. The codes documented in this report are preliminary and upon coder review may  be revised to meet current compliance requirements. Genell Ken, MD 10/01/2023 11:37:45 AM This report has been signed electronically. Number of Addenda: 0

## 2023-10-02 ENCOUNTER — Ambulatory Visit
Admission: RE | Admit: 2023-10-02 | Discharge: 2023-10-02 | Disposition: A | Discharge status: Home / Self Care | Source: Ambulatory Visit | Attending: Radiation Oncology | Admitting: Radiation Oncology

## 2023-10-02 ENCOUNTER — Other Ambulatory Visit: Payer: Self-pay

## 2023-10-02 DIAGNOSIS — Z79899 Other long term (current) drug therapy: Secondary | ICD-10-CM | POA: Diagnosis not present

## 2023-10-02 DIAGNOSIS — Z87891 Personal history of nicotine dependence: Secondary | ICD-10-CM | POA: Diagnosis not present

## 2023-10-02 DIAGNOSIS — C3432 Malignant neoplasm of lower lobe, left bronchus or lung: Secondary | ICD-10-CM | POA: Diagnosis not present

## 2023-10-02 DIAGNOSIS — Z5111 Encounter for antineoplastic chemotherapy: Secondary | ICD-10-CM | POA: Diagnosis not present

## 2023-10-02 DIAGNOSIS — Z510 Encounter for antineoplastic radiation therapy: Secondary | ICD-10-CM | POA: Diagnosis not present

## 2023-10-02 DIAGNOSIS — R799 Abnormal finding of blood chemistry, unspecified: Secondary | ICD-10-CM | POA: Diagnosis not present

## 2023-10-02 LAB — RAD ONC ARIA SESSION SUMMARY
Course Elapsed Days: 16
Plan Fractions Treated to Date: 13
Plan Prescribed Dose Per Fraction: 2 Gy
Plan Total Fractions Prescribed: 30
Plan Total Prescribed Dose: 60 Gy
Reference Point Dosage Given to Date: 26 Gy
Reference Point Session Dosage Given: 2 Gy
Session Number: 13

## 2023-10-02 LAB — CYTOLOGY - NON PAP

## 2023-10-02 NOTE — Progress Notes (Addendum)
 [facility] OFFICE PROGRESS NOTE  Ransom Other, MD [address] [place] [address]  DIAGNOSIS: stage IIIa (T3, N1, M0) non-small cell lung cancer, squamous cell carcinoma with a focal area of small cell presented with large left lower lobe lung mass in addition to left hilar lymphadenopathy diagnosed in April 2025.   PRIOR THERAPY: None  CURRENT THERAPY: Concurrent chemoradiation with carboplatin  for an AUC of 2 paclitaxel  45 mg/m.  First dose on 09/15/23.  Status post 2 cycles of treatment.   INTERVAL HISTORY: Pamela [name] 73 y.o. female returns to the clinic today for a follow-up visit accompanied by her husband. The patient was last seen in the clinic on 09/23/2023. In the interval since being seen, she had worsening LFTs. She was subsequently sent to the emergency room due to jaundice for further evaluation and imaging which showed severe intra and extrahepatic bile duct dilation at the level of the ampulla with no distinct obstructing mass or choledocholelithiasis.  She had an MRCP which showed a 4 x 6 mm stone in the left main bile duct but the findings favored ampullary stenosis.  She also had some abnormal liver parenchyma.   Since being discharged she is feeling much better.  She is still having some weakness but it is better than before. She did lose weight recently due to the diarrhea, nausea, and vomiting she was having prior to her hospitalization. She denies any fever, chills, or night sweats. Denies any abdominal pain. Denies any current nausea or vomiting since discharge.  She denies any significant cough. She states her breathing will be better once she picks up her inhalers from the pharmacy due to not being given her inhalers while admitted. She denies any diarrhea or constipation at this time. Her stools were yellow prior to hospital admission. She has not had a bowel movement since being discharged to know the color. She denies any headache or  visual changes.  She is here today for evaluation before undergoing cycle #3 next week.   MEDICAL HISTORY: Past Medical History:  Diagnosis Date   Anemia    Anxiety    CHF (congestive heart failure) (HCC)    Chronic kidney disease    COPD (chronic obstructive pulmonary disease) (HCC)    Coronary artery disease    Diabetes mellitus without complication (HCC) 10/29/2012   ESRD on dialysis (HCC) 01/25/2015   Headache    Hypertension    PONV (postoperative nausea and vomiting)    Shortness of breath dyspnea     ALLERGIES:  has no known allergies.  MEDICATIONS:  Current Outpatient Medications  Medication Sig Dispense Refill   Accu-Chek Softclix Lancets lancets DX: E11.21 as directed     albuterol  (VENTOLIN  HFA) 108 (90 Base) MCG/ACT inhaler Inhale 2 puffs into the lungs See admin instructions. Inhale 2 puffs into the lungs in the morning and at bedtime- may use an additional 2 puffs twice a day as needed for shortness of breath or wheezing     amLODipine  (NORVASC ) 10 MG tablet Take 10 mg by mouth daily.     aspirin  EC 81 MG tablet Take 81 mg by mouth daily.     atorvastatin  (LIPITOR) 10 MG tablet Take 1 tablet (10 mg total) by mouth daily. (Patient taking differently: Take 10 mg by mouth at bedtime.) 90 tablet 3   Blood Glucose Calibration (ACCU-CHEK AVIVA) SOLN DX: E11.21 as directed     carvedilol  (COREG ) 12.5 MG tablet Take 1 tablet (  12.5 mg total) by mouth 2 (two) times daily. (Patient not taking: Reported on 09/29/2023) 180 tablet 3   glimepiride  (AMARYL ) 2 MG tablet Take 2 mg by mouth daily.     glucose blood (ACCU-CHEK AVIVA PLUS) test strip use as directed to check  blood sugar 4 times daily     insulin  lispro (HUMALOG) 100 UNIT/ML KiwkPen Inject 8-13 Units into the skin See admin instructions. Inject 8-13 units into the skin three times a day with meals, PER SLIDING SCALE (when able to eat and tolerate)     LANTUS  SOLOSTAR 100 UNIT/ML Solostar Pen Inject 22 Units into the skin  in the morning.     latanoprost  (XALATAN ) 0.005 % ophthalmic solution Place 1 drop into both eyes at bedtime.     nitroGLYCERIN  (NITROSTAT ) 0.4 MG SL tablet Place 0.4 mg under the tongue every 5 (five) minutes as needed for chest pain.     ondansetron  (ZOFRAN ) 8 MG tablet Take 1 tablet (8 mg total) by mouth every 8 (eight) hours as needed for nausea or vomiting. Start on the third day after chemotherapy. 30 tablet 1   oxyCODONE -acetaminophen  (PERCOCET/ROXICET) 5-325 MG tablet Take 1 tablet by mouth every 6 (six) hours as needed for severe pain (pain score 7-10). 30 tablet 0   predniSONE  (DELTASONE ) 5 MG tablet Take 5 mg by mouth daily with breakfast.      prochlorperazine  (COMPAZINE ) 10 MG tablet Take 1 tablet (10 mg total) by mouth every 6 (six) hours as needed for nausea or vomiting. 30 tablet 1   sirolimus  (RAPAMUNE ) 1 MG tablet Take 2 mg by mouth daily.     tacrolimus  ER (ENVARSUS  XR) 1 MG TB24 Take 1 mg by mouth daily.     umeclidinium-vilanterol (ANORO ELLIPTA) 62.5-25 MCG/INH AEPB Inhale 1 puff into the lungs daily.      No current facility-administered medications for this visit.    SURGICAL HISTORY:  Past Surgical History:  Procedure Laterality Date   ABDOMINAL HYSTERECTOMY     ANAL FISSURE REPAIR     APPENDECTOMY     '74- open with gallbladder   AV FISTULA PLACEMENT Left 01/31/2015   Procedure: ARTERIOVENOUS FISTULA CREATION-LEFT BRACHIO-CEPHALIC ;  Surgeon: Redell LITTIE Door, MD;  Location: [facility] OR;  Service: Vascular;  Laterality: Left;   AV FISTULA PLACEMENT Left 04/04/2015   Procedure: INSERTION OF ARTERIOVENOUS (AV) GORE-TEX GRAFT ARM;  Surgeon: Carlin FORBES Haddock, MD;  Location: [facility] OR;  Service: Vascular;  Laterality: Left;   BREAST EXCISIONAL BIOPSY Left    BREAST SURGERY     cyst removed   BRONCHIAL BIOPSY  08/18/2023   Procedure: BRONCHOSCOPY, WITH BIOPSY;  Surgeon: Shelah Lamar RAMAN, MD;  Location: [facility] ENDOSCOPY;  Service: Pulmonary;;   BRONCHIAL BRUSHINGS  08/18/2023   Procedure:  BRONCHOSCOPY, WITH BRUSH BIOPSY;  Surgeon: Shelah Lamar RAMAN, MD;  Location: [facility] ENDOSCOPY;  Service: Pulmonary;;   CARDIAC CATHETERIZATION     1 coronary stent placed   CHOLECYSTECTOMY     '74-open   COLONOSCOPY WITH PROPOFOL  N/A 11/17/2012   Procedure: COLONOSCOPY WITH PROPOFOL ;  Surgeon: Gladis MARLA Louder, MD;  Location: [facility] ENDOSCOPY;  Service: Endoscopy;  Laterality: N/A;   CORONARY STENT PLACEMENT     ELBOW SURGERY Right    tendon surgery   ESOPHAGOGASTRODUODENOSCOPY (EGD) WITH PROPOFOL  N/A 11/17/2012   Procedure: ESOPHAGOGASTRODUODENOSCOPY (EGD) WITH PROPOFOL ;  Surgeon: Gladis MARLA Louder, MD;  Location: [facility] ENDOSCOPY;  Service: Endoscopy;  Laterality: N/A;   GANGLION CYST EXCISION Bilateral 10/29/2012  wrist   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   LEFT HEART CATHETERIZATION WITH CORONARY ANGIOGRAM N/A 04/14/2014   Procedure: LEFT HEART CATHETERIZATION WITH CORONARY ANGIOGRAM;  Surgeon: Victory LELON Claudene DOUGLAS, MD;  Location: [facility] CATH LAB;  Service: Cardiovascular;  Laterality: N/A;   PERIPHERAL VASCULAR CATHETERIZATION N/A 03/09/2015   Procedure: Fistulagram;  Surgeon: Redell LITTIE Door, MD;  Location: [facility] INVASIVE CV LAB;  Service: Cardiovascular;  Laterality: N/A;   TEE WITHOUT CARDIOVERSION N/A 01/20/2015   Procedure: TRANSESOPHAGEAL ECHOCARDIOGRAM (TEE);  Surgeon: Vina Okey GAILS, MD;  Location: [facility] ENDOSCOPY;  Service: Cardiovascular;  Laterality: N/A;   TUBAL LIGATION     VIDEO BRONCHOSCOPY  08/18/2023   Procedure: BRONCHOSCOPY, WITH FLUOROSCOPY;  Surgeon: Shelah Lamar RAMAN, MD;  Location: [facility] ENDOSCOPY;  Service: Pulmonary;;    REVIEW OF SYSTEMS:   Review of Systems  Constitutional: Positive for fatigue and appetite change/weight loss. Negative for  chills and fever.  HENT: Negative for mouth sores, nosebleeds, sore throat and trouble swallowing.   Eyes: Negative for eye problems and icterus.  Respiratory: Positive for dyspnea on exertion. Negative for cough, hemoptysis,  and wheezing.   Cardiovascular:  Negative for chest pain and leg swelling.  Gastrointestinal: Negative for abdominal pain, constipation, diarrhea, nausea and vomiting (none at this time).  Genitourinary: Negative for bladder incontinence, difficulty urinating, dysuria, frequency and hematuria.   Musculoskeletal: Negative for back pain, gait problem, neck pain and neck stiffness.  Skin: Negative for itching and rash.  Neurological: Negative for dizziness, extremity weakness, gait problem, headaches, light-headedness and seizures.  Hematological: Negative for adenopathy. Does not bruise/bleed easily.  Psychiatric/Behavioral: Negative for confusion, depression and sleep disturbance. The patient is not nervous/anxious.     PHYSICAL EXAMINATION:  There were no vitals taken for this visit.  ECOG PERFORMANCE STATUS: 1  Physical Exam  Constitutional: Oriented to person, place, and time and well-developed, well-nourished, and in no distress.  HENT:  Head: Normocephalic and atraumatic.  Mouth/Throat: Oropharynx is clear and moist. No oropharyngeal exudate.  Eyes: Conjunctivae are normal. Right eye exhibits no discharge. Left eye exhibits no discharge. No scleral icterus.  Neck: Normal range of motion. Neck supple.  Cardiovascular: Normal rate, regular rhythm, normal heart sounds and intact distal pulses.   Pulmonary/Chest: Effort normal and breath sounds normal. No respiratory distress. No wheezes. No rales.  Abdominal: Soft. Bowel sounds are normal. Exhibits no distension and no mass. There is no tenderness.  Musculoskeletal: Normal range of motion. Exhibits no edema.  Lymphadenopathy:    No cervical adenopathy.  Neurological: Alert and oriented to person, place, and time. Exhibits normal muscle tone. Gait normal. Coordination normal.  Skin: Skin is warm and dry. No rash noted. Not diaphoretic. No erythema. No pallor.  Psychiatric: Mood, memory and judgment normal.  Vitals reviewed.  LABORATORY DATA: Lab Results  Component  Value Date   WBC 5.4 10/01/2023   HGB 12.2 10/01/2023   HCT 39.0 10/01/2023   MCV 90.9 10/01/2023   PLT 94 (L) 10/01/2023      Chemistry      Component Value Date/Time   NA 131 (L) 10/01/2023 0427   K 4.0 10/01/2023 0427   CL 102 10/01/2023 0427   CO2 18 (L) 10/01/2023 0427   BUN 8 10/01/2023 0427   CREATININE 0.41 (L) 10/01/2023 0427   CREATININE 0.99 09/29/2023 0731      Component Value Date/Time   CALCIUM  8.6 (L) 10/01/2023 0427   CALCIUM  9.4 12/06/2014 0821   ALKPHOS 192 (H) 10/01/2023 0427  AST 61 (H) 10/01/2023 0427   AST 220 (HH) 09/29/2023 0731   ALT 185 (H) 10/01/2023 0427   ALT 421 (HH) 09/29/2023 0731   BILITOT 1.7 (H) 10/01/2023 0427   BILITOT 4.9 (HH) 09/29/2023 0731       RADIOGRAPHIC STUDIES:  MR ABDOMEN MRCP WO CONTRAST Result Date: 09/29/2023 CLINICAL DATA:  Jaundice; Jaundice [id number] Pain 144615. EXAM: MRI ABDOMEN WITHOUT CONTRAST  (INCLUDING MRCP) TECHNIQUE: Multiplanar multisequence MR imaging of the abdomen was performed. Heavily T2-weighted images of the biliary and pancreatic ducts were obtained, and three-dimensional MRCP images were rendered by post processing. COMPARISON:  CT scan abdomen and pelvis from earlier the same day. FINDINGS: Lower chest: Unremarkable MR appearance to the lung bases. No pleural effusion. No pericardial effusion. Normal heart size. Hepatobiliary: The liver is normal in size. Noncirrhotic configuration. No focal lesion. 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 Redemonstration of marked intra and extrahepatic bile duct dilation. Extrahepatic bile duct measures up to 1.9 cm in the proximal portion, 1.4 cm in the midportion and 1.2 cm in the distal portion. It gradually tapers near the ampulla of Vater. No discrete choledocholithiasis seen. And incidental  4 x 6 mm stone is seen in the left main bile duct (series 11, image 14). Surgically absent gallbladder. Pancreas: Small/atrophic pancreas. No focal mass. Main pancreatic duct is not dilated. No peripancreatic fat stranding. Spleen:  Within normal limits in size and appearance. No focal mass. Adrenals/Urinary Tract: Unremarkable adrenal glands. Small/atrophic bilateral kidneys noted exhibiting multiple simple cysts with largest arising from the right kidney upper pole measuring 1.6 x 1.7 cm. There is a partially exophytic structure arising from the left kidney lower pole measuring approximately 1.3 x 1.5 cm, which appears mildly hyperintense on T1 weighted images and mildly hypointense on T2 weighted images. This is favored to represent proteinaceous/hemorrhagic cyst. No hydroureteronephrosis. 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 Partially seen urinary bladder, which is within normal limits. Stomach/Bowel: There is a small sliding hiatal hernia. Visualized portions within the abdomen are unremarkable. No disproportionate dilation of bowel loops. Vascular/Lymphatic: No pathologically enlarged lymph nodes identified. No abdominal aortic aneurysm demonstrated. No ascites. Other:  None. Musculoskeletal: No suspicious bone lesions identified. IMPRESSION: 1. Redemonstration of marked intra and extrahepatic bile duct dilation without obstructing mass or choledocholithiasis. There is a 4 x 6 mm stone in the left main bile duct. Findings favoring ampullary stenosis. 2. There is loss of signal of liver on in phase images, in comparison to the out of phase images, as well as marked hypointense signal of liver and spleen on T2 weighted images, favoring secondary hemochromatosis. 3. Multiple other nonacute observations, as described above. Electronically Signed   By: Ree Molt M.D.   On: 09/29/2023 15:34   MR 3D Recon At Scanner Result Date: 09/29/2023 CLINICAL DATA:  Jaundice; Jaundice  [id number] Pain 144615. EXAM: MRI ABDOMEN WITHOUT CONTRAST  (INCLUDING MRCP) TECHNIQUE: Multiplanar multisequence MR imaging of the abdomen was performed. Heavily T2-weighted images of the biliary and pancreatic ducts were obtained, and three-dimensional MRCP images were rendered by post processing. COMPARISON:  CT scan abdomen and pelvis from earlier the same day. FINDINGS: Lower chest: Unremarkable MR appearance to the lung bases. No pleural effusion. No pericardial effusion. Normal heart size. Hepatobiliary: The liver is normal in size. Noncirrhotic  configuration. No focal lesion. 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 Redemonstration of marked intra and extrahepatic bile duct dilation. Extrahepatic bile duct measures up to 1.9 cm in the proximal portion, 1.4 cm in the midportion and 1.2 cm in the distal portion. It gradually tapers near the ampulla of Vater. No discrete choledocholithiasis seen. And incidental 4 x 6 mm stone is seen in the left main bile duct (series 11, image 14). Surgically absent gallbladder. Pancreas: Small/atrophic pancreas. No focal mass. Main pancreatic duct is not dilated. No peripancreatic fat stranding. Spleen:  Within normal limits in size and appearance. No focal mass. Adrenals/Urinary Tract: Unremarkable adrenal glands. Small/atrophic bilateral kidneys noted exhibiting multiple simple cysts with largest arising from the right kidney upper pole measuring 1.6 x 1.7 cm. There is a partially exophytic structure arising from the left kidney lower pole measuring approximately 1.3 x 1.5 cm, which appears mildly hyperintense on T1 weighted images and mildly hypointense on T2 weighted images. This is favored to represent proteinaceous/hemorrhagic cyst. No hydroureteronephrosis. Partially seen right pelvic kidney  without obvious hydronephrosis. Partially seen urinary bladder, which is within normal limits. Stomach/Bowel: There is a small sliding hiatal hernia. Visualized portions within the abdomen are unremarkable. No disproportionate dilation of bowel loops. Vascular/Lymphatic: No pathologically enlarged lymph nodes identified. No abdominal aortic aneurysm demonstrated. No ascites. Other:  None. Musculoskeletal: No suspicious bone lesions identified. IMPRESSION: 1. Redemonstration of marked intra and extrahepatic bile duct dilation without obstructing mass or choledocholithiasis. There is a 4 x 6 mm stone in the left main bile duct. Findings favoring ampullary stenosis. 2. There is loss of signal of liver on in phase images, in comparison to the out of phase images, as well as marked hypointense signal of liver and spleen on T2 weighted images, favoring secondary hemochromatosis. 3. Multiple other nonacute observations, as described above. Electronically Signed   By: Ree Molt M.D.   On: 09/29/2023 15:34   CT ABDOMEN PELVIS W CONTRAST Result Date: 09/29/2023 CLINICAL DATA:  Abdominal pain, acute, nonlocalized. * Tracking Code: BO * EXAM: CT ABDOMEN AND PELVIS WITH CONTRAST TECHNIQUE: Multidetector CT imaging of the abdomen and pelvis was performed using the standard protocol following bolus administration of intravenous contrast. RADIATION DOSE REDUCTION: This exam was performed according to the departmental dose-optimization program which includes automated exposure control, adjustment of the mA and/or kV according to patient size and/or use of iterative reconstruction technique. CONTRAST:  OMNIPAQUE  IOHEXOL  300 MG/ML  SOLN COMPARISON:  CT scan abdomen and pelvis from 02/01/2016 and PET-CT scan from 09/04/2023. FINDINGS: Lower chest: The lung bases are clear. No pleural effusion. The heart is normal in size. No pericardial effusion. Hepatobiliary: The liver is normal in size. Non-cirrhotic configuration. No  suspicious mass. These is mild diffuse hepatic steatosis. There is moderate to severe intra and extrahepatic bile duct dilation up to the level of ampulla of Vater, where there is gradual tapering. No discrete obstructing mass seen. There are subtle faintly calcified 2, 2-3 mm calcifications in the region of ampulla of Vater, which are likely in the pancreatic parenchyma. No discrete choledocholithiasis seen. Gallbladder is surgically absent. Pancreas: Small/atrophic pancreas. There are multiple dystrophic calcifications mainly in the pancreatic head/uncinate process region, likely sequela of chronic pancreatitis. No suspicious mass or peripancreatic fat stranding. Main pancreatic duct is not  dilated. Spleen: Within normal limits. No focal lesion. Adrenals/Urinary Tract: Stable bilateral adrenal glands. Redemonstration of small/atrophic bilateral native kidneys. There is a mildly complex cyst arising from the left kidney lower pole measuring at least 1.2 x 1.5 cm, exhibiting single 1-2 mm sized calcified septum. There is a smaller simple cyst arising from the right kidney upper pole, posteriorly. No hydroureteronephrosis on either side. There are several sub 5 mm nonobstructing calculi in bilateral kidneys. No ureterolithiasis. 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 Unremarkable urinary bladder. Stomach/Bowel: There is small sliding hiatal hernia. No disproportionate dilation of the small or large bowel loops. No evidence of abnormal bowel wall thickening or inflammatory changes. The appendix was not visualized; however there is no acute inflammatory process in the right lower quadrant. There are multiple diverticula mainly in the sigmoid colon, without imaging signs of diverticulitis. Vascular/Lymphatic: No ascites or pneumoperitoneum. No abdominal or pelvic lymphadenopathy, by size criteria. No aneurysmal dilation of the major abdominal arteries. There are moderate  peripheral atherosclerotic vascular calcifications of the aorta and its major branches. Reproductive: The uterus is surgically absent. No large adnexal mass. Other: There is a tiny fat containing umbilical hernia. The soft tissues and abdominal wall are otherwise unremarkable. Musculoskeletal: No suspicious osseous lesions. There are mild - moderate multilevel degenerative changes in the visualized spine. IMPRESSION: 1. No acute inflammatory process identified within the abdomen or pelvis. 2. There is moderate to severe intra and extrahepatic bile duct dilation up to the level of Ampulla of Vater, where there is gradual tapering. No discrete obstructing mass seen. There are subtle faintly calcified 2-3 mm calcifications in the region of Ampulla of Vater, which are likely in the pancreatic parenchyma. No discrete choledocholithiasis seen. If there is clinical concern for biliary obstruction, consider further evaluation with ERCP versus MRCP. 3. Multiple other nonacute observations, as described above. Aortic Atherosclerosis (ICD10-I70.0). Electronically Signed   By: Ree Molt M.D.   On: 09/29/2023 13:41   NM PET Image Initial (PI) Skull Base To Thigh Result Date: 09/05/2023 CLINICAL DATA:  Initial treatment strategy for lung nodule. EXAM: NUCLEAR MEDICINE PET SKULL BASE TO THIGH TECHNIQUE: 8.40 mCi F-18 FDG was injected intravenously. Full-ring PET imaging was performed from the skull base to thigh after the radiotracer. CT data was obtained and used for attenuation correction and anatomic localization. Fasting blood glucose: 198 mg/dl COMPARISON:  Chest CT with contrast 08/08/2023. FINDINGS: Mediastinal blood pool activity: SUV max 3.5 Liver activity: SUV max 4.0. Neck: Mild physiological pharyngeal uptake. Near symmetric uptake of the visualized intracranial compartment. No specific abnormal uptake elsewhere in the neck including along lymph node change of the submandibular, posterior triangle or internal  jugular regions. Incidental CT findings: Visualized portions of the paranasal sinuses and mastoid air cells are clear. The parotid glands, submandibular glands are unremarkable. There is a cystic focus in the left thyroid lobe which again does not show any abnormal uptake but measures up to 18 mm. Confirmatory ultrasound could be considered when clinically appropriate. Scattered vascular calcifications are seen. Chest: Centered in the superior segment of the left lower lobe once again is a large cavitary mass as seen on CT scan. On the prior CT this measured 6.8 x 5.9 cm. Today this has maximum SUV value 11.2. The uptake is along the periphery of lesion most confluent along the inferior aspect as well as medial superiorly. Lesion once again extends from the pleura to the hilum. There is an occluded left lower lobe bronchus  identified once again seen such as series CT image 67. No additional areas of abnormal uptake identified in the lungs. There is slight asymmetric uptake seen along the adjacent left lower lobe lung hilum posteriorly the small focus of uptake maximum SUV of 3.7 on image 66. This corresponds to the hilar node described on the CT scan at 12 mm. No other areas of abnormal uptake above blood pool in the axillary region, hilum or mediastinum. Incidental CT findings: Breathing motion. Emphysematous lung changes are identified. No pleural effusion or pneumothorax. No consolidation. There is a vascular stent along the left upper extremity. Coronary artery calcifications are seen. Trace pericardial fluid. The heart is nonenlarged. Slightly patulous thoracic esophagus. Abdomen/pelvis: There is physiologic distribution radiotracer along the parenchymal organs, bowel and renal collecting systems. Incidental CT findings: Diffuse vascular calcifications are identified. Speckled calcifications in the area of the uncinate process of the pancreas. Severe atrophy of the native kidneys. 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 There is some contrast in the collecting system of the transplant kidney in the bladder. Please correlate with prior MRI of the head. Bowel is nondilated. Scattered stool. Few colonic diverticula. SKELETON: No abnormal uptake along the visualized osseous structures. Incidental CT findings: Scattered degenerative changes particularly of the spine and pelvis. IMPRESSION: As seen on CT there is a large cavitary mass centered along the superior segment left lower lobe abutting the hilum with marginal abnormal radiotracer uptake. Focal uptake seen along the mildly enlarged left hilar node as seen on prior CT scan could be a regional metastasis. No additional areas of abnormal radiotracer uptake to suggest more distant spread of disease. Left thyroid cystic lesion is not hypermetabolic. Follow up ultrasound could be performed when appropriate. 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 Electronically Signed   By: Ranell Bring M.D.   On: 09/05/2023 10:39   MR BRAIN W WO CONTRAST Result Date: 09/04/2023 CLINICAL DATA:  Lung cancer staging EXAM: MRI HEAD WITHOUT AND WITH CONTRAST TECHNIQUE: Multiplanar, multiecho pulse sequences of the brain and surrounding structures were obtained without and with intravenous contrast. CONTRAST:  7mL GADAVIST  GADOBUTROL  1 MMOL/ML IV SOLN COMPARISON:  None Available. FINDINGS: Brain: Small foci of diffusion restriction at the superior right frontal cortex and in the left centrum semiovale to subcortical frontal white matter. These are somewhat weakly restricted but no enhancement typical of subacute infarcts. No abnormal enhancement throughout the brain to suggest metastatic disease. No hemorrhage, hydrocephalus, or collection. Vascular: Major flow voids and vascular enhancements are preserved Skull and upper cervical spine: Normal marrow signal Sinuses/Orbits:  Negative Other: These results will be called to the ordering clinician or representative by the Radiologist Assistant, and communication documented in the PACS or [employer]. 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 No abnormal enhancement to suggest metastatic disease. Electronically Signed   By: Dorn Roulette M.D.   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    The patient is currently undergoing concurrent chemoradiation with carboplatin  for an AUC and paclitaxel  45 mg/m.  The patient is status post 1 cycle  and tolerated well   She is scheduled for repeat labs prior to her infusion on 10/07/23. If she continues to have elevated LFTs, then we would skip treatment for another week or if she develops new symptoms in the interval. If her labs improve and she feels well, then she will proceed with treatment as planned.    We will see her back for follow-up visit in 2 weeks for evaluation and repeat blood work before undergoing cycle #4.   We reviewed signs and symptoms that are expected and unexpected with chemotherapy so she knows to be evaluated sooner if any concerning symptoms.   The patient was advised to call immediately if she has any concerning symptoms in the interval. The patient voices understanding of current disease status and treatment options and is in agreement with the current care plan. All questions were answered. The patient knows to call the clinic with any problems, questions or concerns. We can certainly see the patient much sooner if necessary        No orders of the defined types were placed in this  encounter.   The total time spent in the appointment was 20-29 minutes  Pamela Munter L Broxton Broady, PA-C 10/02/23

## 2023-10-03 ENCOUNTER — Ambulatory Visit
Admission: RE | Admit: 2023-10-03 | Discharge: 2023-10-03 | Disposition: A | Discharge status: Home / Self Care | Source: Ambulatory Visit | Attending: Radiation Oncology | Admitting: Radiation Oncology

## 2023-10-03 ENCOUNTER — Other Ambulatory Visit: Payer: Self-pay

## 2023-10-03 ENCOUNTER — Inpatient Hospital Stay: Admitting: Physician Assistant

## 2023-10-03 VITALS — BP 130/64 | HR 92 | Temp 97.6°F | Resp 17 | Wt 159.8 lb

## 2023-10-03 DIAGNOSIS — R799 Abnormal finding of blood chemistry, unspecified: Secondary | ICD-10-CM | POA: Diagnosis not present

## 2023-10-03 DIAGNOSIS — C3432 Malignant neoplasm of lower lobe, left bronchus or lung: Secondary | ICD-10-CM

## 2023-10-03 DIAGNOSIS — Z87891 Personal history of nicotine dependence: Secondary | ICD-10-CM | POA: Diagnosis not present

## 2023-10-03 DIAGNOSIS — Z79899 Other long term (current) drug therapy: Secondary | ICD-10-CM | POA: Diagnosis not present

## 2023-10-03 DIAGNOSIS — Z5111 Encounter for antineoplastic chemotherapy: Secondary | ICD-10-CM | POA: Diagnosis not present

## 2023-10-03 DIAGNOSIS — Z510 Encounter for antineoplastic radiation therapy: Secondary | ICD-10-CM | POA: Diagnosis not present

## 2023-10-03 LAB — RAD ONC ARIA SESSION SUMMARY
Course Elapsed Days: 17
Plan Fractions Treated to Date: 14
Plan Prescribed Dose Per Fraction: 2 Gy
Plan Total Fractions Prescribed: 30
Plan Total Prescribed Dose: 60 Gy
Reference Point Dosage Given to Date: 28 Gy
Reference Point Session Dosage Given: 2 Gy
Session Number: 14

## 2023-10-05 ENCOUNTER — Encounter (HOSPITAL_COMMUNITY): Payer: Self-pay | Admitting: Gastroenterology

## 2023-10-07 ENCOUNTER — Ambulatory Visit
Admission: RE | Admit: 2023-10-07 | Discharge: 2023-10-07 | Disposition: A | Discharge status: Home / Self Care | Source: Ambulatory Visit | Attending: Radiation Oncology | Admitting: Radiation Oncology

## 2023-10-07 ENCOUNTER — Inpatient Hospital Stay

## 2023-10-07 ENCOUNTER — Other Ambulatory Visit: Payer: Self-pay

## 2023-10-07 ENCOUNTER — Encounter: Payer: Self-pay | Admitting: Internal Medicine

## 2023-10-07 VITALS — BP 138/70 | HR 82 | Temp 98.2°F | Resp 22

## 2023-10-07 DIAGNOSIS — C3432 Malignant neoplasm of lower lobe, left bronchus or lung: Secondary | ICD-10-CM

## 2023-10-07 DIAGNOSIS — Z79899 Other long term (current) drug therapy: Secondary | ICD-10-CM | POA: Diagnosis not present

## 2023-10-07 DIAGNOSIS — Z5111 Encounter for antineoplastic chemotherapy: Secondary | ICD-10-CM | POA: Diagnosis not present

## 2023-10-07 DIAGNOSIS — Z87891 Personal history of nicotine dependence: Secondary | ICD-10-CM | POA: Diagnosis not present

## 2023-10-07 DIAGNOSIS — R799 Abnormal finding of blood chemistry, unspecified: Secondary | ICD-10-CM | POA: Diagnosis not present

## 2023-10-07 DIAGNOSIS — Z510 Encounter for antineoplastic radiation therapy: Secondary | ICD-10-CM | POA: Diagnosis not present

## 2023-10-07 LAB — RAD ONC ARIA SESSION SUMMARY
Course Elapsed Days: 21
Plan Fractions Treated to Date: 15
Plan Prescribed Dose Per Fraction: 2 Gy
Plan Total Fractions Prescribed: 30
Plan Total Prescribed Dose: 60 Gy
Reference Point Dosage Given to Date: 30 Gy
Reference Point Session Dosage Given: 2 Gy
Session Number: 15

## 2023-10-07 LAB — CBC WITH DIFFERENTIAL (CANCER CENTER ONLY)
Abs Immature Granulocytes: 0.07 10*3/uL (ref 0.00–0.07)
Basophils Absolute: 0 10*3/uL (ref 0.0–0.1)
Basophils Relative: 0 %
Eosinophils Absolute: 0.1 10*3/uL (ref 0.0–0.5)
Eosinophils Relative: 1 %
HCT: 41.5 % (ref 36.0–46.0)
Hemoglobin: 13.8 g/dL (ref 12.0–15.0)
Immature Granulocytes: 1 %
Lymphocytes Relative: 4 %
Lymphs Abs: 0.3 10*3/uL — ABNORMAL LOW (ref 0.7–4.0)
MCH: 28.3 pg (ref 26.0–34.0)
MCHC: 33.3 g/dL (ref 30.0–36.0)
MCV: 85.2 fL (ref 80.0–100.0)
Monocytes Absolute: 0.7 10*3/uL (ref 0.1–1.0)
Monocytes Relative: 10 %
Neutro Abs: 6 10*3/uL (ref 1.7–7.7)
Neutrophils Relative %: 84 %
Platelet Count: 157 10*3/uL (ref 150–400)
RBC: 4.87 MIL/uL (ref 3.87–5.11)
RDW: 13.6 % (ref 11.5–15.5)
WBC Count: 7.1 10*3/uL (ref 4.0–10.5)
nRBC: 0 % (ref 0.0–0.2)

## 2023-10-07 LAB — CMP (CANCER CENTER ONLY)
ALT: 50 U/L — ABNORMAL HIGH (ref 0–44)
AST: 21 U/L (ref 15–41)
Albumin: 3.8 g/dL (ref 3.5–5.0)
Alkaline Phosphatase: 151 U/L — ABNORMAL HIGH (ref 38–126)
Anion gap: 8 (ref 5–15)
BUN: 13 mg/dL (ref 8–23)
CO2: 28 mmol/L (ref 22–32)
Calcium: 9.2 mg/dL (ref 8.9–10.3)
Chloride: 102 mmol/L (ref 98–111)
Creatinine: 0.69 mg/dL (ref 0.44–1.00)
GFR, Estimated: >60 mL/min (ref >60)
Glucose, Bld: 78 mg/dL (ref 70–99)
Potassium: 4 mmol/L (ref 3.5–5.1)
Sodium: 138 mmol/L (ref 135–145)
Total Bilirubin: 0.7 mg/dL (ref 0.0–1.2)
Total Protein: 6.9 g/dL (ref 6.5–8.1)

## 2023-10-07 MED ORDER — SODIUM CHLORIDE 0.9 % IV SOLN: INTRAVENOUS | Status: DC

## 2023-10-07 MED ORDER — SODIUM CHLORIDE 0.9 % IV SOLN
45.0000 mg/m2 | Freq: Once | INTRAVENOUS | Status: AC
  Administered 2023-10-07: 84 mg via INTRAVENOUS
  Filled 2023-10-07: qty 14

## 2023-10-07 MED ORDER — SODIUM CHLORIDE 0.9 % IV SOLN
170.4000 mg | Freq: Once | INTRAVENOUS | Status: AC
  Administered 2023-10-07: 170 mg via INTRAVENOUS
  Filled 2023-10-07: qty 17

## 2023-10-07 MED ORDER — PALONOSETRON HCL INJECTION 0.25 MG/5ML
0.2500 mg | Freq: Once | INTRAVENOUS | Status: AC
  Administered 2023-10-07: 0.25 mg via INTRAVENOUS
  Filled 2023-10-07: qty 5

## 2023-10-07 MED ORDER — DIPHENHYDRAMINE HCL 50 MG/ML IJ SOLN
50.0000 mg | Freq: Once | INTRAMUSCULAR | Status: AC
  Administered 2023-10-07: 50 mg via INTRAVENOUS
  Filled 2023-10-07: qty 1

## 2023-10-07 MED ORDER — FAMOTIDINE IN NACL 20-0.9 MG/50ML-% IV SOLN
20.0000 mg | Freq: Once | INTRAVENOUS | Status: AC
  Administered 2023-10-07: 20 mg via INTRAVENOUS
  Filled 2023-10-07: qty 50

## 2023-10-07 MED ORDER — DEXAMETHASONE SODIUM PHOSPHATE 10 MG/ML IJ SOLN
10.0000 mg | Freq: Once | INTRAMUSCULAR | Status: AC
Start: 2023-10-07 — End: 2023-10-07
  Administered 2023-10-07: 10 mg via INTRAVENOUS
  Filled 2023-10-07: qty 1

## 2023-10-07 NOTE — Patient Instructions (Signed)
 [facility] - A DEPT OF [facility]  Discharge Instructions: Thank you for choosing [facility] to provide your oncology and hematology care.   If you have a lab appointment with the Cancer Center, please go directly to the Cancer Center and check in at the registration area.   Wear comfortable clothing and clothing appropriate for easy access to any Portacath or PICC line.   We strive to give you quality time with your provider. You may need to reschedule your appointment if you arrive late (15 or more minutes).  Arriving late affects you and other patients whose appointments are after yours.  Also, if you miss three or more appointments without notifying the office, you may be dismissed from the clinic at the provider's discretion.      For prescription refill requests, have your pharmacy contact our office and allow 72 hours for refills to be completed.    Today you received the following chemotherapy and/or immunotherapy agents:   Paclitaxel, Carboplatin    To help prevent nausea and vomiting after your treatment, we encourage you to take your nausea medication as directed.  BELOW ARE SYMPTOMS THAT SHOULD BE REPORTED IMMEDIATELY: *FEVER GREATER THAN 100.4 F (38 C) OR HIGHER *CHILLS OR SWEATING *NAUSEA AND VOMITING THAT IS NOT CONTROLLED WITH YOUR NAUSEA MEDICATION *UNUSUAL SHORTNESS OF BREATH *UNUSUAL BRUISING OR BLEEDING *URINARY PROBLEMS (pain or burning when urinating, or frequent urination) *BOWEL PROBLEMS (unusual diarrhea, constipation, pain near the anus) TENDERNESS IN MOUTH AND THROAT WITH OR WITHOUT PRESENCE OF ULCERS (sore throat, sores in mouth, or a toothache) UNUSUAL RASH, SWELLING OR PAIN  UNUSUAL VAGINAL DISCHARGE OR ITCHING   Items with * indicate a potential emergency and should be followed up as soon as possible or go to the Emergency Department if any problems should occur.  Please show the CHEMOTHERAPY ALERT CARD or  IMMUNOTHERAPY ALERT CARD at check-in to the Emergency Department and triage nurse.  Should you have questions after your visit or need to cancel or reschedule your appointment, please contact [facility]  Dept: [phone]  and follow the prompts.  Office hours are 8:00 a.m. to 4:30 p.m. Monday - Friday. Please note that voicemails left after 4:00 p.m. may not be returned until the following business day.  We are closed weekends and major holidays. You have access to a nurse at all times for urgent questions. Please call the main number to the clinic Dept: [phone] and follow the prompts.   For any non-urgent questions, you may also contact your provider using MyChart. We now offer e-Visits for anyone 63 and older to request care online for non-urgent symptoms. For details visit [email or web address].   Also download the MyChart app! Go to the app store, search "MyChart", open the app, select [place], and log in with your MyChart username and password.

## 2023-10-08 ENCOUNTER — Ambulatory Visit
Admission: RE | Admit: 2023-10-08 | Discharge: 2023-10-08 | Disposition: A | Discharge status: Home / Self Care | Source: Ambulatory Visit | Attending: Radiation Oncology | Admitting: Radiation Oncology

## 2023-10-08 ENCOUNTER — Other Ambulatory Visit: Payer: Self-pay

## 2023-10-08 DIAGNOSIS — Z87891 Personal history of nicotine dependence: Secondary | ICD-10-CM | POA: Diagnosis not present

## 2023-10-08 DIAGNOSIS — Z5111 Encounter for antineoplastic chemotherapy: Secondary | ICD-10-CM | POA: Diagnosis not present

## 2023-10-08 DIAGNOSIS — Z510 Encounter for antineoplastic radiation therapy: Secondary | ICD-10-CM | POA: Diagnosis not present

## 2023-10-08 DIAGNOSIS — R799 Abnormal finding of blood chemistry, unspecified: Secondary | ICD-10-CM | POA: Diagnosis not present

## 2023-10-08 DIAGNOSIS — C3432 Malignant neoplasm of lower lobe, left bronchus or lung: Secondary | ICD-10-CM | POA: Diagnosis not present

## 2023-10-08 DIAGNOSIS — Z79899 Other long term (current) drug therapy: Secondary | ICD-10-CM | POA: Diagnosis not present

## 2023-10-08 LAB — RAD ONC ARIA SESSION SUMMARY
Course Elapsed Days: 22
Plan Fractions Treated to Date: 16
Plan Prescribed Dose Per Fraction: 2 Gy
Plan Total Fractions Prescribed: 30
Plan Total Prescribed Dose: 60 Gy
Reference Point Dosage Given to Date: 32 Gy
Reference Point Session Dosage Given: 2 Gy
Session Number: 16

## 2023-10-09 ENCOUNTER — Other Ambulatory Visit: Payer: Self-pay

## 2023-10-09 ENCOUNTER — Ambulatory Visit
Admission: RE | Admit: 2023-10-09 | Discharge: 2023-10-09 | Disposition: A | Discharge status: Home / Self Care | Source: Ambulatory Visit | Attending: Radiation Oncology | Admitting: Radiation Oncology

## 2023-10-09 DIAGNOSIS — Z5111 Encounter for antineoplastic chemotherapy: Secondary | ICD-10-CM | POA: Diagnosis not present

## 2023-10-09 DIAGNOSIS — C3432 Malignant neoplasm of lower lobe, left bronchus or lung: Secondary | ICD-10-CM | POA: Diagnosis not present

## 2023-10-09 DIAGNOSIS — R799 Abnormal finding of blood chemistry, unspecified: Secondary | ICD-10-CM | POA: Diagnosis not present

## 2023-10-09 DIAGNOSIS — Z510 Encounter for antineoplastic radiation therapy: Secondary | ICD-10-CM | POA: Diagnosis not present

## 2023-10-09 DIAGNOSIS — Z79899 Other long term (current) drug therapy: Secondary | ICD-10-CM | POA: Diagnosis not present

## 2023-10-09 DIAGNOSIS — Z87891 Personal history of nicotine dependence: Secondary | ICD-10-CM | POA: Diagnosis not present

## 2023-10-09 LAB — RAD ONC ARIA SESSION SUMMARY
Course Elapsed Days: 23
Plan Fractions Treated to Date: 17
Plan Prescribed Dose Per Fraction: 2 Gy
Plan Total Fractions Prescribed: 30
Plan Total Prescribed Dose: 60 Gy
Reference Point Dosage Given to Date: 34 Gy
Reference Point Session Dosage Given: 2 Gy
Session Number: 17

## 2023-10-10 ENCOUNTER — Other Ambulatory Visit: Payer: Self-pay

## 2023-10-10 ENCOUNTER — Ambulatory Visit
Admission: RE | Admit: 2023-10-10 | Discharge: 2023-10-10 | Disposition: A | Discharge status: Home / Self Care | Source: Ambulatory Visit | Attending: Radiation Oncology | Admitting: Radiation Oncology

## 2023-10-10 DIAGNOSIS — R799 Abnormal finding of blood chemistry, unspecified: Secondary | ICD-10-CM | POA: Diagnosis not present

## 2023-10-10 DIAGNOSIS — C3432 Malignant neoplasm of lower lobe, left bronchus or lung: Secondary | ICD-10-CM | POA: Diagnosis not present

## 2023-10-10 DIAGNOSIS — Z510 Encounter for antineoplastic radiation therapy: Secondary | ICD-10-CM | POA: Diagnosis not present

## 2023-10-10 DIAGNOSIS — Z87891 Personal history of nicotine dependence: Secondary | ICD-10-CM | POA: Diagnosis not present

## 2023-10-10 DIAGNOSIS — Z5111 Encounter for antineoplastic chemotherapy: Secondary | ICD-10-CM | POA: Diagnosis not present

## 2023-10-10 DIAGNOSIS — Z79899 Other long term (current) drug therapy: Secondary | ICD-10-CM | POA: Diagnosis not present

## 2023-10-10 LAB — RAD ONC ARIA SESSION SUMMARY
Course Elapsed Days: 24
Plan Fractions Treated to Date: 18
Plan Prescribed Dose Per Fraction: 2 Gy
Plan Total Fractions Prescribed: 30
Plan Total Prescribed Dose: 60 Gy
Reference Point Dosage Given to Date: 36 Gy
Reference Point Session Dosage Given: 2 Gy
Session Number: 18

## 2023-10-13 ENCOUNTER — Ambulatory Visit
Admission: RE | Admit: 2023-10-13 | Discharge: 2023-10-13 | Disposition: A | Discharge status: Home / Self Care | Source: Ambulatory Visit | Attending: Radiation Oncology | Admitting: Radiation Oncology

## 2023-10-13 ENCOUNTER — Other Ambulatory Visit: Payer: Self-pay | Admitting: Radiation Oncology

## 2023-10-13 ENCOUNTER — Other Ambulatory Visit: Payer: Self-pay

## 2023-10-13 ENCOUNTER — Inpatient Hospital Stay: Attending: Internal Medicine

## 2023-10-13 ENCOUNTER — Inpatient Hospital Stay

## 2023-10-13 VITALS — BP 147/70 | HR 91 | Temp 98.2°F | Resp 20 | Wt 162.2 lb

## 2023-10-13 DIAGNOSIS — Z87891 Personal history of nicotine dependence: Secondary | ICD-10-CM | POA: Insufficient documentation

## 2023-10-13 DIAGNOSIS — Z510 Encounter for antineoplastic radiation therapy: Secondary | ICD-10-CM | POA: Insufficient documentation

## 2023-10-13 DIAGNOSIS — C3432 Malignant neoplasm of lower lobe, left bronchus or lung: Secondary | ICD-10-CM | POA: Insufficient documentation

## 2023-10-13 DIAGNOSIS — Z5111 Encounter for antineoplastic chemotherapy: Secondary | ICD-10-CM | POA: Insufficient documentation

## 2023-10-13 DIAGNOSIS — Z79899 Other long term (current) drug therapy: Secondary | ICD-10-CM | POA: Diagnosis not present

## 2023-10-13 LAB — CMP (CANCER CENTER ONLY)
ALT: 27 U/L (ref 0–44)
AST: 22 U/L (ref 15–41)
Albumin: 3.8 g/dL (ref 3.5–5.0)
Alkaline Phosphatase: 118 U/L (ref 38–126)
Anion gap: 8 (ref 5–15)
BUN: 10 mg/dL (ref 8–23)
CO2: 28 mmol/L (ref 22–32)
Calcium: 9 mg/dL (ref 8.9–10.3)
Chloride: 100 mmol/L (ref 98–111)
Creatinine: 0.74 mg/dL (ref 0.44–1.00)
GFR, Estimated: >60 mL/min (ref >60)
Glucose, Bld: 111 mg/dL — ABNORMAL HIGH (ref 70–99)
Potassium: 3.8 mmol/L (ref 3.5–5.1)
Sodium: 136 mmol/L (ref 135–145)
Total Bilirubin: 0.9 mg/dL (ref 0.0–1.2)
Total Protein: 6.6 g/dL (ref 6.5–8.1)

## 2023-10-13 LAB — CBC WITH DIFFERENTIAL (CANCER CENTER ONLY)
Abs Immature Granulocytes: 0.05 10*3/uL (ref 0.00–0.07)
Basophils Absolute: 0 10*3/uL (ref 0.0–0.1)
Basophils Relative: 0 %
Eosinophils Absolute: 0 10*3/uL (ref 0.0–0.5)
Eosinophils Relative: 1 %
HCT: 40.6 % (ref 36.0–46.0)
Hemoglobin: 13.7 g/dL (ref 12.0–15.0)
Immature Granulocytes: 1 %
Lymphocytes Relative: 3 %
Lymphs Abs: 0.2 10*3/uL — ABNORMAL LOW (ref 0.7–4.0)
MCH: 28.5 pg (ref 26.0–34.0)
MCHC: 33.7 g/dL (ref 30.0–36.0)
MCV: 84.6 fL (ref 80.0–100.0)
Monocytes Absolute: 0.4 10*3/uL (ref 0.1–1.0)
Monocytes Relative: 7 %
Neutro Abs: 5.1 10*3/uL (ref 1.7–7.7)
Neutrophils Relative %: 88 %
Platelet Count: 110 10*3/uL — ABNORMAL LOW (ref 150–400)
RBC: 4.8 MIL/uL (ref 3.87–5.11)
RDW: 13.3 % (ref 11.5–15.5)
WBC Count: 5.8 10*3/uL (ref 4.0–10.5)
nRBC: 0 % (ref 0.0–0.2)

## 2023-10-13 LAB — RAD ONC ARIA SESSION SUMMARY
Course Elapsed Days: 27
Plan Fractions Treated to Date: 19
Plan Prescribed Dose Per Fraction: 2 Gy
Plan Total Fractions Prescribed: 30
Plan Total Prescribed Dose: 60 Gy
Reference Point Dosage Given to Date: 38 Gy
Reference Point Session Dosage Given: 2 Gy
Session Number: 19

## 2023-10-13 MED ORDER — LIDOCAINE VISCOUS HCL 2 % MT SOLN: OROMUCOSAL | 2 refills | Status: DC

## 2023-10-13 MED ORDER — DIPHENHYDRAMINE HCL 50 MG/ML IJ SOLN
50.0000 mg | Freq: Once | INTRAMUSCULAR | Status: AC
  Administered 2023-10-13: 50 mg via INTRAVENOUS
  Filled 2023-10-13: qty 1

## 2023-10-13 MED ORDER — PALONOSETRON HCL INJECTION 0.25 MG/5ML
0.2500 mg | Freq: Once | INTRAVENOUS | Status: AC
  Administered 2023-10-13: 0.25 mg via INTRAVENOUS
  Filled 2023-10-13: qty 5

## 2023-10-13 MED ORDER — FAMOTIDINE IN NACL 20-0.9 MG/50ML-% IV SOLN
20.0000 mg | Freq: Once | INTRAVENOUS | Status: AC
  Administered 2023-10-13: 20 mg via INTRAVENOUS
  Filled 2023-10-13: qty 50

## 2023-10-13 MED ORDER — DEXAMETHASONE SODIUM PHOSPHATE 10 MG/ML IJ SOLN
10.0000 mg | Freq: Once | INTRAMUSCULAR | Status: AC
  Administered 2023-10-13: 10 mg via INTRAVENOUS
  Filled 2023-10-13: qty 1

## 2023-10-13 MED ORDER — SODIUM CHLORIDE 0.9 % IV SOLN: INTRAVENOUS | Status: DC

## 2023-10-13 MED ORDER — SODIUM CHLORIDE 0.9 % IV SOLN
170.4000 mg | Freq: Once | INTRAVENOUS | Status: AC
  Administered 2023-10-13: 170 mg via INTRAVENOUS
  Filled 2023-10-13: qty 17

## 2023-10-13 MED ORDER — SODIUM CHLORIDE 0.9 % IV SOLN
45.0000 mg/m2 | Freq: Once | INTRAVENOUS | Status: AC
  Administered 2023-10-13: 84 mg via INTRAVENOUS
  Filled 2023-10-13: qty 14

## 2023-10-13 NOTE — Patient Instructions (Signed)
 [facility] - A DEPT OF [facility]  Discharge Instructions: Thank you for choosing [facility] to provide your oncology and hematology care.   If you have a lab appointment with the Cancer Center, please go directly to the Cancer Center and check in at the registration area.   Wear comfortable clothing and clothing appropriate for easy access to any Portacath or PICC line.   We strive to give you quality time with your provider. You may need to reschedule your appointment if you arrive late (15 or more minutes).  Arriving late affects you and other patients whose appointments are after yours.  Also, if you miss three or more appointments without notifying the office, you may be dismissed from the clinic at the provider's discretion.      For prescription refill requests, have your pharmacy contact our office and allow 72 hours for refills to be completed.    Today you received the following chemotherapy and/or immunotherapy agents:   Paclitaxel, Carboplatin    To help prevent nausea and vomiting after your treatment, we encourage you to take your nausea medication as directed.  BELOW ARE SYMPTOMS THAT SHOULD BE REPORTED IMMEDIATELY: *FEVER GREATER THAN 100.4 F (38 C) OR HIGHER *CHILLS OR SWEATING *NAUSEA AND VOMITING THAT IS NOT CONTROLLED WITH YOUR NAUSEA MEDICATION *UNUSUAL SHORTNESS OF BREATH *UNUSUAL BRUISING OR BLEEDING *URINARY PROBLEMS (pain or burning when urinating, or frequent urination) *BOWEL PROBLEMS (unusual diarrhea, constipation, pain near the anus) TENDERNESS IN MOUTH AND THROAT WITH OR WITHOUT PRESENCE OF ULCERS (sore throat, sores in mouth, or a toothache) UNUSUAL RASH, SWELLING OR PAIN  UNUSUAL VAGINAL DISCHARGE OR ITCHING   Items with * indicate a potential emergency and should be followed up as soon as possible or go to the Emergency Department if any problems should occur.  Please show the CHEMOTHERAPY ALERT CARD or  IMMUNOTHERAPY ALERT CARD at check-in to the Emergency Department and triage nurse.  Should you have questions after your visit or need to cancel or reschedule your appointment, please contact [facility]  Dept: [phone]  and follow the prompts.  Office hours are 8:00 a.m. to 4:30 p.m. Monday - Friday. Please note that voicemails left after 4:00 p.m. may not be returned until the following business day.  We are closed weekends and major holidays. You have access to a nurse at all times for urgent questions. Please call the main number to the clinic Dept: [phone] and follow the prompts.   For any non-urgent questions, you may also contact your provider using MyChart. We now offer e-Visits for anyone 63 and older to request care online for non-urgent symptoms. For details visit [email or web address].   Also download the MyChart app! Go to the app store, search "MyChart", open the app, select [place], and log in with your MyChart username and password.

## 2023-10-14 ENCOUNTER — Ambulatory Visit

## 2023-10-14 ENCOUNTER — Other Ambulatory Visit: Payer: Self-pay

## 2023-10-14 ENCOUNTER — Ambulatory Visit
Admission: RE | Admit: 2023-10-14 | Discharge: 2023-10-14 | Disposition: A | Discharge status: Home / Self Care | Source: Ambulatory Visit | Attending: Radiation Oncology | Admitting: Radiation Oncology

## 2023-10-14 DIAGNOSIS — C3432 Malignant neoplasm of lower lobe, left bronchus or lung: Secondary | ICD-10-CM | POA: Diagnosis not present

## 2023-10-14 DIAGNOSIS — Z87891 Personal history of nicotine dependence: Secondary | ICD-10-CM | POA: Diagnosis not present

## 2023-10-14 DIAGNOSIS — Z510 Encounter for antineoplastic radiation therapy: Secondary | ICD-10-CM | POA: Diagnosis not present

## 2023-10-14 DIAGNOSIS — Z79899 Other long term (current) drug therapy: Secondary | ICD-10-CM | POA: Diagnosis not present

## 2023-10-14 DIAGNOSIS — Z5111 Encounter for antineoplastic chemotherapy: Secondary | ICD-10-CM | POA: Diagnosis not present

## 2023-10-14 LAB — RAD ONC ARIA SESSION SUMMARY
Course Elapsed Days: 28
Plan Fractions Treated to Date: 20
Plan Prescribed Dose Per Fraction: 2 Gy
Plan Total Fractions Prescribed: 30
Plan Total Prescribed Dose: 60 Gy
Reference Point Dosage Given to Date: 40 Gy
Reference Point Session Dosage Given: 2 Gy
Session Number: 20

## 2023-10-15 ENCOUNTER — Other Ambulatory Visit: Payer: Self-pay

## 2023-10-15 ENCOUNTER — Ambulatory Visit
Admission: RE | Admit: 2023-10-15 | Discharge: 2023-10-15 | Disposition: A | Discharge status: Home / Self Care | Source: Ambulatory Visit | Attending: Radiation Oncology | Admitting: Radiation Oncology

## 2023-10-15 DIAGNOSIS — C3492 Malignant neoplasm of unspecified part of left bronchus or lung: Secondary | ICD-10-CM | POA: Diagnosis not present

## 2023-10-15 DIAGNOSIS — Z9889 Other specified postprocedural states: Secondary | ICD-10-CM | POA: Diagnosis not present

## 2023-10-15 DIAGNOSIS — D8481 Immunodeficiency due to conditions classified elsewhere: Secondary | ICD-10-CM | POA: Diagnosis not present

## 2023-10-15 DIAGNOSIS — Z5111 Encounter for antineoplastic chemotherapy: Secondary | ICD-10-CM | POA: Diagnosis not present

## 2023-10-15 DIAGNOSIS — I10 Essential (primary) hypertension: Secondary | ICD-10-CM | POA: Diagnosis not present

## 2023-10-15 DIAGNOSIS — K831 Obstruction of bile duct: Secondary | ICD-10-CM | POA: Diagnosis not present

## 2023-10-15 DIAGNOSIS — Z87891 Personal history of nicotine dependence: Secondary | ICD-10-CM | POA: Diagnosis not present

## 2023-10-15 DIAGNOSIS — I5030 Unspecified diastolic (congestive) heart failure: Secondary | ICD-10-CM | POA: Diagnosis not present

## 2023-10-15 DIAGNOSIS — Z510 Encounter for antineoplastic radiation therapy: Secondary | ICD-10-CM | POA: Diagnosis not present

## 2023-10-15 DIAGNOSIS — Z79899 Other long term (current) drug therapy: Secondary | ICD-10-CM | POA: Diagnosis not present

## 2023-10-15 DIAGNOSIS — Z940 Kidney transplant status: Secondary | ICD-10-CM | POA: Diagnosis not present

## 2023-10-15 DIAGNOSIS — J449 Chronic obstructive pulmonary disease, unspecified: Secondary | ICD-10-CM | POA: Diagnosis not present

## 2023-10-15 DIAGNOSIS — C3432 Malignant neoplasm of lower lobe, left bronchus or lung: Secondary | ICD-10-CM | POA: Diagnosis not present

## 2023-10-15 LAB — RAD ONC ARIA SESSION SUMMARY
Course Elapsed Days: 29
Plan Fractions Treated to Date: 21
Plan Prescribed Dose Per Fraction: 2 Gy
Plan Total Fractions Prescribed: 30
Plan Total Prescribed Dose: 60 Gy
Reference Point Dosage Given to Date: 42 Gy
Reference Point Session Dosage Given: 2 Gy
Session Number: 21

## 2023-10-16 ENCOUNTER — Other Ambulatory Visit: Payer: Self-pay

## 2023-10-16 ENCOUNTER — Ambulatory Visit
Admission: RE | Admit: 2023-10-16 | Discharge: 2023-10-16 | Disposition: A | Discharge status: Home / Self Care | Source: Ambulatory Visit | Attending: Radiation Oncology | Admitting: Radiation Oncology

## 2023-10-16 DIAGNOSIS — Z5111 Encounter for antineoplastic chemotherapy: Secondary | ICD-10-CM | POA: Diagnosis not present

## 2023-10-16 DIAGNOSIS — C3432 Malignant neoplasm of lower lobe, left bronchus or lung: Secondary | ICD-10-CM | POA: Diagnosis not present

## 2023-10-16 DIAGNOSIS — Z87891 Personal history of nicotine dependence: Secondary | ICD-10-CM | POA: Diagnosis not present

## 2023-10-16 DIAGNOSIS — Z79899 Other long term (current) drug therapy: Secondary | ICD-10-CM | POA: Diagnosis not present

## 2023-10-16 DIAGNOSIS — Z510 Encounter for antineoplastic radiation therapy: Secondary | ICD-10-CM | POA: Diagnosis not present

## 2023-10-16 LAB — RAD ONC ARIA SESSION SUMMARY
Course Elapsed Days: 30
Plan Fractions Treated to Date: 22
Plan Prescribed Dose Per Fraction: 2 Gy
Plan Total Fractions Prescribed: 30
Plan Total Prescribed Dose: 60 Gy
Reference Point Dosage Given to Date: 44 Gy
Reference Point Session Dosage Given: 2 Gy
Session Number: 22

## 2023-10-17 ENCOUNTER — Ambulatory Visit
Admission: RE | Admit: 2023-10-17 | Discharge: 2023-10-17 | Disposition: A | Discharge status: Home / Self Care | Source: Ambulatory Visit | Attending: Radiation Oncology | Admitting: Radiation Oncology

## 2023-10-17 ENCOUNTER — Other Ambulatory Visit: Payer: Self-pay

## 2023-10-17 DIAGNOSIS — Z5111 Encounter for antineoplastic chemotherapy: Secondary | ICD-10-CM | POA: Diagnosis not present

## 2023-10-17 DIAGNOSIS — Z87891 Personal history of nicotine dependence: Secondary | ICD-10-CM | POA: Diagnosis not present

## 2023-10-17 DIAGNOSIS — C3432 Malignant neoplasm of lower lobe, left bronchus or lung: Secondary | ICD-10-CM | POA: Diagnosis not present

## 2023-10-17 DIAGNOSIS — Z510 Encounter for antineoplastic radiation therapy: Secondary | ICD-10-CM | POA: Diagnosis not present

## 2023-10-17 DIAGNOSIS — Z79899 Other long term (current) drug therapy: Secondary | ICD-10-CM | POA: Diagnosis not present

## 2023-10-17 LAB — RAD ONC ARIA SESSION SUMMARY
Course Elapsed Days: 31
Plan Fractions Treated to Date: 23
Plan Prescribed Dose Per Fraction: 2 Gy
Plan Total Fractions Prescribed: 30
Plan Total Prescribed Dose: 60 Gy
Reference Point Dosage Given to Date: 46 Gy
Reference Point Session Dosage Given: 2 Gy
Session Number: 23

## 2023-10-20 ENCOUNTER — Ambulatory Visit
Admission: RE | Admit: 2023-10-20 | Discharge: 2023-10-20 | Disposition: A | Discharge status: Home / Self Care | Source: Ambulatory Visit | Attending: Radiation Oncology | Admitting: Radiation Oncology

## 2023-10-20 ENCOUNTER — Inpatient Hospital Stay

## 2023-10-20 ENCOUNTER — Inpatient Hospital Stay (HOSPITAL_BASED_OUTPATIENT_CLINIC_OR_DEPARTMENT_OTHER): Admitting: Internal Medicine

## 2023-10-20 ENCOUNTER — Other Ambulatory Visit: Payer: Self-pay

## 2023-10-20 VITALS — BP 133/75 | HR 103 | Temp 97.7°F | Resp 18 | Ht 61.5 in | Wt 160.4 lb

## 2023-10-20 DIAGNOSIS — Z79899 Other long term (current) drug therapy: Secondary | ICD-10-CM | POA: Diagnosis not present

## 2023-10-20 DIAGNOSIS — C3432 Malignant neoplasm of lower lobe, left bronchus or lung: Secondary | ICD-10-CM

## 2023-10-20 DIAGNOSIS — Z87891 Personal history of nicotine dependence: Secondary | ICD-10-CM | POA: Diagnosis not present

## 2023-10-20 DIAGNOSIS — Z510 Encounter for antineoplastic radiation therapy: Secondary | ICD-10-CM | POA: Diagnosis not present

## 2023-10-20 DIAGNOSIS — C3490 Malignant neoplasm of unspecified part of unspecified bronchus or lung: Secondary | ICD-10-CM | POA: Diagnosis not present

## 2023-10-20 DIAGNOSIS — Z5111 Encounter for antineoplastic chemotherapy: Secondary | ICD-10-CM | POA: Diagnosis not present

## 2023-10-20 LAB — CBC WITH DIFFERENTIAL (CANCER CENTER ONLY)
Abs Immature Granulocytes: 0.02 10*3/uL (ref 0.00–0.07)
Basophils Absolute: 0 10*3/uL (ref 0.0–0.1)
Basophils Relative: 1 %
Eosinophils Absolute: 0.1 10*3/uL (ref 0.0–0.5)
Eosinophils Relative: 2 %
HCT: 38.3 % (ref 36.0–46.0)
Hemoglobin: 12.9 g/dL (ref 12.0–15.0)
Immature Granulocytes: 1 %
Lymphocytes Relative: 4 %
Lymphs Abs: 0.1 10*3/uL — ABNORMAL LOW (ref 0.7–4.0)
MCH: 27.8 pg (ref 26.0–34.0)
MCHC: 33.7 g/dL (ref 30.0–36.0)
MCV: 82.5 fL (ref 80.0–100.0)
Monocytes Absolute: 0.4 10*3/uL (ref 0.1–1.0)
Monocytes Relative: 10 %
Neutro Abs: 3 10*3/uL (ref 1.7–7.7)
Neutrophils Relative %: 82 %
Platelet Count: 87 10*3/uL — ABNORMAL LOW (ref 150–400)
RBC: 4.64 MIL/uL (ref 3.87–5.11)
RDW: 13.3 % (ref 11.5–15.5)
WBC Count: 3.6 10*3/uL — ABNORMAL LOW (ref 4.0–10.5)
nRBC: 0 % (ref 0.0–0.2)

## 2023-10-20 LAB — CMP (CANCER CENTER ONLY)
ALT: 20 U/L (ref 0–44)
AST: 19 U/L (ref 15–41)
Albumin: 3.9 g/dL (ref 3.5–5.0)
Alkaline Phosphatase: 104 U/L (ref 38–126)
Anion gap: 8 (ref 5–15)
BUN: 11 mg/dL (ref 8–23)
CO2: 27 mmol/L (ref 22–32)
Calcium: 8.9 mg/dL (ref 8.9–10.3)
Chloride: 101 mmol/L (ref 98–111)
Creatinine: 0.77 mg/dL (ref 0.44–1.00)
GFR, Estimated: >60 mL/min (ref >60)
Glucose, Bld: 59 mg/dL — ABNORMAL LOW (ref 70–99)
Potassium: 3.5 mmol/L (ref 3.5–5.1)
Sodium: 136 mmol/L (ref 135–145)
Total Bilirubin: 0.8 mg/dL (ref 0.0–1.2)
Total Protein: 6.5 g/dL (ref 6.5–8.1)

## 2023-10-20 LAB — RAD ONC ARIA SESSION SUMMARY
Course Elapsed Days: 34
Plan Fractions Treated to Date: 24
Plan Prescribed Dose Per Fraction: 2 Gy
Plan Total Fractions Prescribed: 30
Plan Total Prescribed Dose: 60 Gy
Reference Point Dosage Given to Date: 48 Gy
Reference Point Session Dosage Given: 2 Gy
Session Number: 24

## 2023-10-20 NOTE — Progress Notes (Signed)
 [facility] [phone]   [phone]  OFFICE PROGRESS NOTE  [name], MD [address] [place] [address]  DIAGNOSIS: stage IIIa (T3, N1, M0) non-small cell lung cancer, squamous cell carcinoma with a focal area of small cell presented with large left lower lobe lung mass in addition to left hilar lymphadenopathy diagnosed in April 2025.    PRIOR THERAPY: None   CURRENT THERAPY: Concurrent chemoradiation with carboplatin  for an AUC of 2 paclitaxel  45 mg/m.  First dose on 09/15/23.  Status post 5  cycles of treatment.   INTERVAL HISTORY: Pamela [name] 73 y.o. female returns to the clinic today for follow-up visit accompanied by her husband.Discussed the use of AI scribe software for clinical note transcription with the patient, who gave verbal consent to proceed.  History of Present Illness   Pamela [name] is a 73 year old female with stage IIIA non-small cell lung cancer who presents for evaluation before starting cycle six of chemoradiation. She is accompanied by her husband, Athena Bland.  Diagnosed with stage IIIA non-small cell lung cancer in April 2025, she is undergoing concurrent chemoradiation therapy with weekly carboplatin  and paclitaxel . She has completed five cycles and is here for evaluation before starting the sixth cycle. She missed one chemotherapy treatment due to elevated liver enzymes, and her current platelet count is low at 87,000, resulting in a temporary pause in chemotherapy.  She feels tired today after undergoing a CT scan and receiving radiation therapy earlier. No significant issues with chemotherapy such as nausea or vomiting, as she takes medication for nausea as needed. Her swallowing has improved with medication provided last Thursday.       MEDICAL HISTORY: Past Medical History:  Diagnosis Date   Anemia    Anxiety    CHF (congestive heart failure) (HCC)    Chronic kidney disease    COPD  (chronic obstructive pulmonary disease) (HCC)    Coronary artery disease    Diabetes mellitus without complication (HCC) 10/29/2012   ESRD on dialysis (HCC) 01/25/2015   Headache    Hypertension    PONV (postoperative nausea and vomiting)    Shortness of breath dyspnea     ALLERGIES:  has no known allergies.  MEDICATIONS:  Current Outpatient Medications  Medication Sig Dispense Refill   Accu-Chek Softclix Lancets lancets DX: E11.21 as directed     albuterol  (VENTOLIN  HFA) 108 (90 Base) MCG/ACT inhaler Inhale 2 puffs into the lungs See admin instructions. Inhale 2 puffs into the lungs in the morning and at bedtime- may use an additional 2 puffs twice a day as needed for shortness of breath or wheezing     amLODipine  (NORVASC ) 10 MG tablet Take 10 mg by mouth daily.     aspirin  EC 81 MG tablet Take 81 mg by mouth daily.     atorvastatin  (LIPITOR) 10 MG tablet Take 1 tablet (10 mg total) by mouth daily. (Patient taking differently: Take 10 mg by mouth at bedtime.) 90 tablet 3   Blood Glucose Calibration (ACCU-CHEK AVIVA) SOLN DX: E11.21 as directed     carvedilol  (COREG ) 12.5 MG tablet Take 1 tablet (12.5 mg total) by mouth 2 (two) times daily. (Patient not taking: Reported on 09/29/2023) 180 tablet 3   glimepiride  (AMARYL ) 2 MG tablet Take 2 mg by mouth daily.     glucose blood (ACCU-CHEK AVIVA PLUS) test strip use as directed to check  blood sugar 4 times daily  insulin  lispro (HUMALOG) 100 UNIT/ML KiwkPen Inject 8-13 Units into the skin See admin instructions. Inject 8-13 units into the skin three times a day with meals, PER SLIDING SCALE (when able to eat and tolerate)     LANTUS  SOLOSTAR 100 UNIT/ML Solostar Pen Inject 22 Units into the skin in the morning.     latanoprost  (XALATAN ) 0.005 % ophthalmic solution Place 1 drop into both eyes at bedtime.     lidocaine  (XYLOCAINE ) 2 % solution Patient: Mix 1part 2% viscous lidocaine , 1part H20. Swallow 10mL of diluted mixture, before  meals and at bedtime, up to QID 200 mL 2   nitroGLYCERIN  (NITROSTAT ) 0.4 MG SL tablet Place 0.4 mg under the tongue every 5 (five) minutes as needed for chest pain.     ondansetron  (ZOFRAN ) 8 MG tablet Take 1 tablet (8 mg total) by mouth every 8 (eight) hours as needed for nausea or vomiting. Start on the third day after chemotherapy. 30 tablet 1   oxyCODONE -acetaminophen  (PERCOCET/ROXICET) 5-325 MG tablet Take 1 tablet by mouth every 6 (six) hours as needed for severe pain (pain score 7-10). 30 tablet 0   predniSONE  (DELTASONE ) 5 MG tablet Take 5 mg by mouth daily with breakfast.      prochlorperazine  (COMPAZINE ) 10 MG tablet Take 1 tablet (10 mg total) by mouth every 6 (six) hours as needed for nausea or vomiting. 30 tablet 1   sirolimus  (RAPAMUNE ) 1 MG tablet Take 2 mg by mouth daily.     tacrolimus  ER (ENVARSUS  XR) 1 MG TB24 Take 1 mg by mouth daily.     umeclidinium-vilanterol (ANORO ELLIPTA) 62.5-25 MCG/INH AEPB Inhale 1 puff into the lungs daily.      No current facility-administered medications for this visit.    SURGICAL HISTORY:  Past Surgical History:  Procedure Laterality Date   ABDOMINAL HYSTERECTOMY     ANAL FISSURE REPAIR     APPENDECTOMY     '74- open with gallbladder   AV FISTULA PLACEMENT Left 01/31/2015   Procedure: ARTERIOVENOUS FISTULA CREATION-LEFT BRACHIO-CEPHALIC ;  Surgeon: Arvil Lauber, MD;  [facility];  Service: Vascular;  Laterality: Left;   AV FISTULA PLACEMENT Left 04/04/2015   Procedure: INSERTION OF ARTERIOVENOUS (AV) GORE-TEX GRAFT ARM;  Surgeon: Richrd Char, MD;  Location: [facility] OR;  Service: Vascular;  Laterality: Left;   BILIARY BRUSHING  10/01/2023   Procedure: BRUSH BIOPSY, BILE DUCT;  Surgeon: Genell Ken, MD;  Location: [facility] ENDOSCOPY;  Service: Gastroenterology;;   BREAST EXCISIONAL BIOPSY Left    BREAST SURGERY     cyst removed   BRONCHIAL BIOPSY  08/18/2023   Procedure: BRONCHOSCOPY, WITH BIOPSY;  Surgeon: Denson Flake, MD;  Location: [facility]  ENDOSCOPY;  Service: Pulmonary;;   BRONCHIAL BRUSHINGS  08/18/2023   Procedure: BRONCHOSCOPY, WITH BRUSH BIOPSY;  Surgeon: Denson Flake, MD;  Location: [facility] ENDOSCOPY;  Service: Pulmonary;;   CARDIAC CATHETERIZATION     1 coronary stent placed   CHOLECYSTECTOMY     '74-open   COLONOSCOPY WITH PROPOFOL  N/A 11/17/2012   Procedure: COLONOSCOPY WITH PROPOFOL ;  Surgeon: Garrett Kallman, MD;  Location: [facility] ENDOSCOPY;  Service: Endoscopy;  Laterality: N/A;   CORONARY STENT PLACEMENT     ELBOW SURGERY Right    tendon surgery   ERCP N/A 10/01/2023   Procedure: ERCP, WITH INTERVENTION IF INDICATED;  Surgeon: Genell Ken, MD;  Location: [facility] ENDOSCOPY;  Service: Gastroenterology;  Laterality: N/A;   ESOPHAGOGASTRODUODENOSCOPY (EGD) WITH PROPOFOL  N/A 11/17/2012   Procedure:  ESOPHAGOGASTRODUODENOSCOPY (EGD) WITH PROPOFOL ;  Surgeon: Garrett Kallman, MD;  Location: [facility] ENDOSCOPY;  Service: Endoscopy;  Laterality: N/A;   GANGLION CYST EXCISION Bilateral 10/29/2012   wrist   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   LEFT HEART CATHETERIZATION WITH CORONARY ANGIOGRAM N/A 04/14/2014   Procedure: LEFT HEART CATHETERIZATION WITH CORONARY ANGIOGRAM;  Surgeon: Mickiel Albany, MD;  Location: [facility] CATH LAB;  Service: Cardiovascular;  Laterality: N/A;   PERIPHERAL VASCULAR CATHETERIZATION N/A 03/09/2015   Procedure: Fistulagram;  Surgeon: Arvil Lauber, MD;  Location: [facility] INVASIVE CV LAB;  Service: Cardiovascular;  Laterality: N/A;   STONE EXTRACTION WITH BASKET  10/01/2023   Procedure: ERCP, WITH LITHROTRIPSY OR REMOVAL OF COMMON BILE DUCT CALCULUS USING BASKET;  Surgeon: Genell Ken, MD;  Location: [facility] ENDOSCOPY;  Service: Gastroenterology;;   TEE WITHOUT CARDIOVERSION N/A 01/20/2015   Procedure: TRANSESOPHAGEAL ECHOCARDIOGRAM (TEE);  Surgeon: Elmyra Haggard, MD;  Location: [facility] ENDOSCOPY;  Service: Cardiovascular;  Laterality: N/A;   TUBAL LIGATION     VIDEO BRONCHOSCOPY  08/18/2023   Procedure: BRONCHOSCOPY, WITH FLUOROSCOPY;   Surgeon: Denson Flake, MD;  Location: [facility] ENDOSCOPY;  Service: Pulmonary;;    REVIEW OF SYSTEMS:  Constitutional: positive for fatigue Eyes: negative Ears, nose, mouth, throat, and face: negative Respiratory: negative Cardiovascular: negative Gastrointestinal: negative Genitourinary:negative Integument/breast: negative Hematologic/lymphatic: negative Musculoskeletal:negative Neurological: negative Behavioral/Psych: negative Endocrine: negative Allergic/Immunologic: negative   PHYSICAL EXAMINATION: General appearance: alert, cooperative, fatigued, and no distress Head: Normocephalic, without obvious abnormality, atraumatic Neck: no adenopathy, no JVD, supple, symmetrical, trachea midline, and thyroid not enlarged, symmetric, no tenderness/mass/nodules Lymph nodes: Cervical, supraclavicular, and axillary nodes normal. Resp: clear to auscultation bilaterally Back: symmetric, no curvature. ROM normal. No CVA tenderness. Cardio: regular rate and rhythm, S1, S2 normal, no murmur, click, rub or gallop GI: soft, non-tender; bowel sounds normal; no masses,  no organomegaly Extremities: extremities normal, atraumatic, no cyanosis or edema Neurologic: Alert and oriented X 3, normal strength and tone. Normal symmetric reflexes. Normal coordination and gait  ECOG PERFORMANCE STATUS: 1 - Symptomatic but completely ambulatory  There were no vitals taken for this visit.  LABORATORY DATA: Lab Results  Component Value Date   WBC 3.6 (L) 10/20/2023   HGB 12.9 10/20/2023   HCT 38.3 10/20/2023   MCV 82.5 10/20/2023   PLT 87 (L) 10/20/2023      Chemistry      Component Value Date/Time   NA 136 10/20/2023 0910   K 3.5 10/20/2023 0910   CL 101 10/20/2023 0910   CO2 27 10/20/2023 0910   BUN 11 10/20/2023 0910   CREATININE 0.77 10/20/2023 0910      Component Value Date/Time   CALCIUM  8.9 10/20/2023 0910   CALCIUM  9.4 12/06/2014 0821   ALKPHOS 104 10/20/2023 0910   AST 19 10/20/2023  0910   ALT 20 10/20/2023 0910   BILITOT 0.8 10/20/2023 0910       RADIOGRAPHIC STUDIES: DG ERCP Result Date: 10/03/2023 CLINICAL DATA:  Elevated liver enzymes.  Biliary dilatation. EXAM: ERCP TECHNIQUE: Multiple spot images obtained with the fluoroscopic device and submitted for interpretation post-procedure. FLUOROSCOPY: Radiation Exposure Index (as provided by the fluoroscopic device): 30.21 mGy Kerma COMPARISON:  MRI 09/29/2023 FINDINGS: Retrograde cholangiogram demonstrates severe dilatation of the intrahepatic and extrahepatic biliary system. No large filling defects or stones. IMPRESSION: Severe biliary dilatation. These images were submitted for radiologic interpretation only. Please see the procedural report. Electronically Signed   By: Elene Griffes M.D.   On: 10/03/2023 12:01  MR ABDOMEN MRCP WO CONTRAST Result Date: 09/29/2023 CLINICAL DATA:  Jaundice; Jaundice [id number] Pain 144615. EXAM: MRI ABDOMEN WITHOUT CONTRAST  (INCLUDING MRCP) TECHNIQUE: Multiplanar multisequence MR imaging of the abdomen was performed. Heavily T2-weighted images of the biliary and pancreatic ducts were obtained, and three-dimensional MRCP images were rendered by post processing. COMPARISON:  CT scan abdomen and pelvis from earlier the same day. FINDINGS: Lower chest: Unremarkable MR appearance to the lung bases. No pleural effusion. No pericardial effusion. Normal heart size. Hepatobiliary: The liver is normal in size. Noncirrhotic configuration. No focal lesion. 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 Redemonstration of marked intra and extrahepatic bile duct dilation. Extrahepatic bile duct measures up to 1.9 cm in the proximal portion, 1.4 cm in the midportion and 1.2 cm in the distal portion. It gradually tapers near the ampulla of Vater.  No discrete choledocholithiasis seen. And incidental 4 x 6 mm stone is seen in the left main bile duct (series 11, image 14). Surgically absent gallbladder. Pancreas: Small/atrophic pancreas. No focal mass. Main pancreatic duct is not dilated. No peripancreatic fat stranding. Spleen:  Within normal limits in size and appearance. No focal mass. Adrenals/Urinary Tract: Unremarkable adrenal glands. Small/atrophic bilateral kidneys noted exhibiting multiple simple cysts with largest arising from the right kidney upper pole measuring 1.6 x 1.7 cm. There is a partially exophytic structure arising from the left kidney lower pole measuring approximately 1.3 x 1.5 cm, which appears mildly hyperintense on T1 weighted images and mildly hypointense on T2 weighted images. This is favored to represent proteinaceous/hemorrhagic cyst. No hydroureteronephrosis. 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 Partially seen urinary bladder, which is within normal limits. Stomach/Bowel: There is a small sliding hiatal hernia. Visualized portions within the abdomen are unremarkable. No disproportionate dilation of bowel loops. Vascular/Lymphatic: No pathologically enlarged lymph nodes identified. No abdominal aortic aneurysm demonstrated. No ascites. Other:  None. Musculoskeletal: No suspicious bone lesions identified. IMPRESSION: 1. Redemonstration of marked intra and extrahepatic bile duct dilation without obstructing mass or choledocholithiasis. There is a 4 x 6 mm stone in the left main bile duct. Findings favoring ampullary stenosis. 2. There is loss of signal of liver on in phase images, in comparison to the out of phase images, as well as marked hypointense signal of liver and spleen on T2 weighted images, favoring secondary hemochromatosis. 3. Multiple other nonacute observations, as described above. Electronically Signed   By: Beula Brunswick M.D.   On: 09/29/2023 15:34   MR 3D Recon At Scanner Result  Date: 09/29/2023 CLINICAL DATA:  Jaundice; Jaundice [id number] Pain 144615. EXAM: MRI ABDOMEN WITHOUT CONTRAST  (INCLUDING MRCP) TECHNIQUE: Multiplanar multisequence MR imaging of the abdomen was performed. Heavily T2-weighted images of the biliary and pancreatic ducts were obtained, and three-dimensional MRCP images were rendered by post processing. COMPARISON:  CT scan abdomen and pelvis from earlier the same day. FINDINGS: Lower chest: Unremarkable MR appearance to the lung bases. No pleural effusion. No pericardial effusion. Normal heart size. Hepatobiliary: The liver is normal in size. Noncirrhotic configuration. No focal lesion. 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 Redemonstration of marked intra and extrahepatic bile  duct dilation. Extrahepatic bile duct measures up to 1.9 cm in the proximal portion, 1.4 cm in the midportion and 1.2 cm in the distal portion. It gradually tapers near the ampulla of Vater. No discrete choledocholithiasis seen. And incidental 4 x 6 mm stone is seen in the left main bile duct (series 11, image 14). Surgically absent gallbladder. Pancreas: Small/atrophic pancreas. No focal mass. Main pancreatic duct is not dilated. No peripancreatic fat stranding. Spleen:  Within normal limits in size and appearance. No focal mass. Adrenals/Urinary Tract: Unremarkable adrenal glands. Small/atrophic bilateral kidneys noted exhibiting multiple simple cysts with largest arising from the right kidney upper pole measuring 1.6 x 1.7 cm. There is a partially exophytic structure arising from the left kidney lower pole measuring approximately 1.3 x 1.5 cm, which appears mildly hyperintense on T1 weighted images and mildly hypointense on T2 weighted images. This is favored to represent proteinaceous/hemorrhagic cyst. No  hydroureteronephrosis. 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 Partially seen urinary bladder, which is within normal limits. Stomach/Bowel: There is a small sliding hiatal hernia. Visualized portions within the abdomen are unremarkable. No disproportionate dilation of bowel loops. Vascular/Lymphatic: No pathologically enlarged lymph nodes identified. No abdominal aortic aneurysm demonstrated. No ascites. Other:  None. Musculoskeletal: No suspicious bone lesions identified. IMPRESSION: 1. Redemonstration of marked intra and extrahepatic bile duct dilation without obstructing mass or choledocholithiasis. There is a 4 x 6 mm stone in the left main bile duct. Findings favoring ampullary stenosis. 2. There is loss of signal of liver on in phase images, in comparison to the out of phase images, as well as marked hypointense signal of liver and spleen on T2 weighted images, favoring secondary hemochromatosis. 3. Multiple other nonacute observations, as described above. Electronically Signed   By: Beula Brunswick M.D.   On: 09/29/2023 15:34   CT ABDOMEN PELVIS W CONTRAST Result Date: 09/29/2023 CLINICAL DATA:  Abdominal pain, acute, nonlocalized. * Tracking Code: BO * EXAM: CT ABDOMEN AND PELVIS WITH CONTRAST TECHNIQUE: Multidetector CT imaging of the abdomen and pelvis was performed using the standard protocol following bolus administration of intravenous contrast. RADIATION DOSE REDUCTION: This exam was performed according to the departmental dose-optimization program which includes automated exposure control, adjustment of the mA and/or kV according to patient size and/or use of iterative reconstruction technique. CONTRAST:  OMNIPAQUE  IOHEXOL  300 MG/ML  SOLN COMPARISON:  CT scan abdomen and pelvis from 02/01/2016 and PET-CT scan from 09/04/2023. FINDINGS: Lower chest: The lung bases are clear. No pleural effusion. The heart is normal in size. No pericardial effusion. Hepatobiliary: The  liver is normal in size. Non-cirrhotic configuration. No suspicious mass. These is mild diffuse hepatic steatosis. There is moderate to severe intra and extrahepatic bile duct dilation up to the level of ampulla of Vater, where there is gradual tapering. No discrete obstructing mass seen. There are subtle faintly calcified 2, 2-3 mm calcifications in the region of ampulla of Vater, which are likely in the pancreatic parenchyma. No discrete choledocholithiasis seen. Gallbladder is surgically absent. Pancreas: Small/atrophic pancreas. There are multiple dystrophic calcifications mainly in the pancreatic head/uncinate process region, likely sequela of chronic pancreatitis. No suspicious mass or peripancreatic fat stranding. Main pancreatic duct is not dilated. Spleen: Within normal limits. No focal lesion. Adrenals/Urinary Tract: Stable bilateral adrenal glands. Redemonstration of small/atrophic bilateral native kidneys. There is a mildly complex cyst arising from the left kidney lower pole measuring at least 1.2 x 1.5 cm, exhibiting single 1-2 mm sized calcified septum. There is  a smaller simple cyst arising from the right kidney upper pole, posteriorly. No hydroureteronephrosis on either side. There are several sub 5 mm nonobstructing calculi in bilateral kidneys. No ureterolithiasis. 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 Unremarkable urinary bladder. Stomach/Bowel: There is small sliding hiatal hernia. No disproportionate dilation of the small or large bowel loops. No evidence of abnormal bowel wall thickening or inflammatory changes. The appendix was not visualized; however there is no acute inflammatory process in the right lower quadrant. There are multiple diverticula mainly in the sigmoid colon, without imaging signs of diverticulitis. Vascular/Lymphatic: No ascites or pneumoperitoneum. No abdominal or pelvic lymphadenopathy, by size criteria. No aneurysmal dilation of  the major abdominal arteries. There are moderate peripheral atherosclerotic vascular calcifications of the aorta and its major branches. Reproductive: The uterus is surgically absent. No large adnexal mass. Other: There is a tiny fat containing umbilical hernia. The soft tissues and abdominal wall are otherwise unremarkable. Musculoskeletal: No suspicious osseous lesions. There are mild - moderate multilevel degenerative changes in the visualized spine. IMPRESSION: 1. No acute inflammatory process identified within the abdomen or pelvis. 2. There is moderate to severe intra and extrahepatic bile duct dilation up to the level of Ampulla of Vater, where there is gradual tapering. No discrete obstructing mass seen. There are subtle faintly calcified 2-3 mm calcifications in the region of Ampulla of Vater, which are likely in the pancreatic parenchyma. No discrete choledocholithiasis seen. If there is clinical concern for biliary obstruction, consider further evaluation with ERCP versus MRCP. 3. Multiple other nonacute observations, as described above. Aortic Atherosclerosis (ICD10-I70.0). Electronically Signed   By: Beula Brunswick M.D.   On: 09/29/2023 13:41    ASSESSMENT AND PLAN: This is a very pleasant 73 years old white female with stage IIIa (T3, N1, M0) non-small cell lung cancer, squamous cell carcinoma with a focal area of small cell presented with large left lower lobe lung mass in addition to left hilar lymphadenopathy diagnosed in April 2025.  She is currently undergoing a course of concurrent chemoradiation with carboplatin  for an AUC of 2 paclitaxel  45 mg/m.  First dose on 09/15/23.  Status post 5 cycles of treatment.  She has been tolerating her treatment fairly well except for mild odynophagia and fatigue Her platelets count are low today and I recommended for her to cancel her chemotherapy today. Assessment and Plan    Stage IIIA non-small cell lung cancer Stage IIIA non-small cell lung cancer,  diagnosed in April 2025. Currently undergoing concurrent chemoradiation with weekly carboplatin  and paclitaxel . Completed five cycles, with the sixth cycle delayed due to thrombocytopenia. Radiation therapy is ongoing, with the last session scheduled for October 31, 2023. A CT scan will be performed in a month to assess treatment response. - Administer last cycle of chemotherapy next week if platelet count improves. - Complete radiation therapy by October 31, 2023. - Order CT scan in one month to evaluate treatment response. - Schedule follow-up appointment one week after the CT scan.  Thrombocytopenia Thrombocytopenia with a platelet count of 87,000, below the desired threshold of 100,000 for chemotherapy administration. Current thrombocytopenia has led to the postponement of the sixth chemotherapy cycle. - Hold chemotherapy this week due to low platelet count. - Reassess platelet count next week before administering the last cycle of chemotherapy.   She was advised to call immediately if she has any concerning symptoms in the interval. The patient voices understanding of current disease status and treatment options and is in  agreement with the current care plan.  All questions were answered. The patient knows to call the clinic with any problems, questions or concerns. We can certainly see the patient much sooner if necessary.  The total time spent in the appointment was 30 minutes including review of chart and various tests results, discussions about plan of care and coordination of care plan .   Disclaimer: This note was dictated with voice recognition software. Similar sounding words can inadvertently be transcribed and may not be corrected upon review.

## 2023-10-21 ENCOUNTER — Ambulatory Visit
Admission: RE | Admit: 2023-10-21 | Discharge: 2023-10-21 | Disposition: A | Discharge status: Home / Self Care | Source: Ambulatory Visit | Attending: Radiation Oncology | Admitting: Radiation Oncology

## 2023-10-21 ENCOUNTER — Ambulatory Visit (HOSPITAL_COMMUNITY)
Admission: RE | Admit: 2023-10-21 | Discharge: 2023-10-21 | Disposition: A | Discharge status: Home / Self Care | Source: Ambulatory Visit | Attending: Acute Care | Admitting: Acute Care

## 2023-10-21 ENCOUNTER — Other Ambulatory Visit: Payer: Self-pay

## 2023-10-21 DIAGNOSIS — C3432 Malignant neoplasm of lower lobe, left bronchus or lung: Secondary | ICD-10-CM | POA: Insufficient documentation

## 2023-10-21 DIAGNOSIS — Z510 Encounter for antineoplastic radiation therapy: Secondary | ICD-10-CM | POA: Diagnosis not present

## 2023-10-21 DIAGNOSIS — Z87891 Personal history of nicotine dependence: Secondary | ICD-10-CM | POA: Diagnosis not present

## 2023-10-21 LAB — PULMONARY FUNCTION TEST
DL/VA % pred: 48 %
DL/VA: 2.03 ml/min/mmHg/L
DLCO cor % pred: 32 %
DLCO cor: 5.71 ml/min/mmHg
DLCO unc % pred: 32 %
DLCO unc: 5.62 ml/min/mmHg
FEF 25-75 Post: 0.28 L/s
FEF 25-75 Pre: 0.23 L/s
FEF2575-%Change-Post: 23 %
FEF2575-%Pred-Post: 17 %
FEF2575-%Pred-Pre: 14 %
FEV1-%Change-Post: 17 %
FEV1-%Pred-Post: 38 %
FEV1-%Pred-Pre: 33 %
FEV1-Post: 0.74 L
FEV1-Pre: 0.63 L
FEV1FVC-%Change-Post: 6 %
FEV1FVC-%Pred-Pre: 55 %
FEV6-%Change-Post: 10 %
FEV6-%Pred-Post: 61 %
FEV6-%Pred-Pre: 55 %
FEV6-Post: 1.48 L
FEV6-Pre: 1.34 L
FEV6FVC-%Change-Post: 0 %
FEV6FVC-%Pred-Post: 94 %
FEV6FVC-%Pred-Pre: 94 %
FVC-%Change-Post: 10 %
FVC-%Pred-Post: 65 %
FVC-%Pred-Pre: 59 %
FVC-Post: 1.65 L
FVC-Pre: 1.49 L
Post FEV1/FVC ratio: 45 %
Post FEV6/FVC ratio: 89 %
Pre FEV1/FVC ratio: 42 %
Pre FEV6/FVC Ratio: 90 %
RV % pred: 101 %
RV: 2.13 L
TLC % pred: 79 %
TLC: 3.66 L

## 2023-10-21 LAB — RAD ONC ARIA SESSION SUMMARY
Course Elapsed Days: 35
Plan Fractions Treated to Date: 25
Plan Prescribed Dose Per Fraction: 2 Gy
Plan Total Fractions Prescribed: 30
Plan Total Prescribed Dose: 60 Gy
Reference Point Dosage Given to Date: 50 Gy
Reference Point Session Dosage Given: 2 Gy
Session Number: 25

## 2023-10-21 MED ORDER — ALBUTEROL SULFATE (2.5 MG/3ML) 0.083% IN NEBU
2.5000 mg | INHALATION_SOLUTION | Freq: Once | RESPIRATORY_TRACT | Status: AC
  Administered 2023-10-21: 2.5 mg via RESPIRATORY_TRACT

## 2023-10-22 ENCOUNTER — Ambulatory Visit
Admission: RE | Admit: 2023-10-22 | Discharge: 2023-10-22 | Disposition: A | Discharge status: Home / Self Care | Source: Ambulatory Visit | Attending: Radiation Oncology | Admitting: Radiation Oncology

## 2023-10-22 ENCOUNTER — Other Ambulatory Visit: Payer: Self-pay

## 2023-10-22 DIAGNOSIS — Z5111 Encounter for antineoplastic chemotherapy: Secondary | ICD-10-CM | POA: Diagnosis not present

## 2023-10-22 DIAGNOSIS — Z510 Encounter for antineoplastic radiation therapy: Secondary | ICD-10-CM | POA: Diagnosis not present

## 2023-10-22 DIAGNOSIS — Z87891 Personal history of nicotine dependence: Secondary | ICD-10-CM | POA: Diagnosis not present

## 2023-10-22 DIAGNOSIS — C3432 Malignant neoplasm of lower lobe, left bronchus or lung: Secondary | ICD-10-CM | POA: Diagnosis not present

## 2023-10-22 DIAGNOSIS — Z79899 Other long term (current) drug therapy: Secondary | ICD-10-CM | POA: Diagnosis not present

## 2023-10-22 LAB — RAD ONC ARIA SESSION SUMMARY
Course Elapsed Days: 36
Plan Fractions Treated to Date: 26
Plan Prescribed Dose Per Fraction: 2 Gy
Plan Total Fractions Prescribed: 30
Plan Total Prescribed Dose: 60 Gy
Reference Point Dosage Given to Date: 52 Gy
Reference Point Session Dosage Given: 2 Gy
Session Number: 26

## 2023-10-23 ENCOUNTER — Other Ambulatory Visit: Payer: Self-pay

## 2023-10-23 ENCOUNTER — Ambulatory Visit
Admission: RE | Admit: 2023-10-23 | Discharge: 2023-10-23 | Disposition: A | Discharge status: Home / Self Care | Source: Ambulatory Visit | Attending: Radiation Oncology | Admitting: Radiation Oncology

## 2023-10-23 DIAGNOSIS — Z79899 Other long term (current) drug therapy: Secondary | ICD-10-CM | POA: Diagnosis not present

## 2023-10-23 DIAGNOSIS — Z87891 Personal history of nicotine dependence: Secondary | ICD-10-CM | POA: Diagnosis not present

## 2023-10-23 DIAGNOSIS — Z5111 Encounter for antineoplastic chemotherapy: Secondary | ICD-10-CM | POA: Diagnosis not present

## 2023-10-23 DIAGNOSIS — Z510 Encounter for antineoplastic radiation therapy: Secondary | ICD-10-CM | POA: Diagnosis not present

## 2023-10-23 DIAGNOSIS — C3432 Malignant neoplasm of lower lobe, left bronchus or lung: Secondary | ICD-10-CM | POA: Diagnosis not present

## 2023-10-23 LAB — RAD ONC ARIA SESSION SUMMARY
Course Elapsed Days: 37
Plan Fractions Treated to Date: 27
Plan Prescribed Dose Per Fraction: 2 Gy
Plan Total Fractions Prescribed: 30
Plan Total Prescribed Dose: 60 Gy
Reference Point Dosage Given to Date: 54 Gy
Reference Point Session Dosage Given: 2 Gy
Session Number: 27

## 2023-10-24 ENCOUNTER — Ambulatory Visit
Admission: RE | Admit: 2023-10-24 | Discharge: 2023-10-24 | Disposition: A | Discharge status: Home / Self Care | Source: Ambulatory Visit | Attending: Radiation Oncology | Admitting: Radiation Oncology

## 2023-10-24 ENCOUNTER — Other Ambulatory Visit: Payer: Self-pay

## 2023-10-24 DIAGNOSIS — Z87891 Personal history of nicotine dependence: Secondary | ICD-10-CM | POA: Diagnosis not present

## 2023-10-24 DIAGNOSIS — Z5111 Encounter for antineoplastic chemotherapy: Secondary | ICD-10-CM | POA: Diagnosis not present

## 2023-10-24 DIAGNOSIS — Z79899 Other long term (current) drug therapy: Secondary | ICD-10-CM | POA: Diagnosis not present

## 2023-10-24 DIAGNOSIS — C3432 Malignant neoplasm of lower lobe, left bronchus or lung: Secondary | ICD-10-CM | POA: Diagnosis not present

## 2023-10-24 DIAGNOSIS — Z510 Encounter for antineoplastic radiation therapy: Secondary | ICD-10-CM | POA: Diagnosis not present

## 2023-10-24 LAB — RAD ONC ARIA SESSION SUMMARY
Course Elapsed Days: 38
Plan Fractions Treated to Date: 28
Plan Prescribed Dose Per Fraction: 2 Gy
Plan Total Fractions Prescribed: 30
Plan Total Prescribed Dose: 60 Gy
Reference Point Dosage Given to Date: 56 Gy
Reference Point Session Dosage Given: 2 Gy
Session Number: 28

## 2023-10-27 ENCOUNTER — Ambulatory Visit
Admission: RE | Admit: 2023-10-27 | Discharge: 2023-10-27 | Disposition: A | Discharge status: Home / Self Care | Source: Ambulatory Visit | Attending: Radiation Oncology | Admitting: Radiation Oncology

## 2023-10-27 ENCOUNTER — Encounter: Payer: Self-pay | Admitting: Internal Medicine

## 2023-10-27 ENCOUNTER — Other Ambulatory Visit: Payer: Self-pay

## 2023-10-27 ENCOUNTER — Inpatient Hospital Stay

## 2023-10-27 VITALS — BP 146/88 | HR 100 | Temp 99.0°F | Resp 20

## 2023-10-27 DIAGNOSIS — C3432 Malignant neoplasm of lower lobe, left bronchus or lung: Secondary | ICD-10-CM

## 2023-10-27 DIAGNOSIS — Z87891 Personal history of nicotine dependence: Secondary | ICD-10-CM | POA: Diagnosis not present

## 2023-10-27 DIAGNOSIS — Z79899 Other long term (current) drug therapy: Secondary | ICD-10-CM | POA: Diagnosis not present

## 2023-10-27 DIAGNOSIS — Z5111 Encounter for antineoplastic chemotherapy: Secondary | ICD-10-CM | POA: Diagnosis not present

## 2023-10-27 DIAGNOSIS — Z510 Encounter for antineoplastic radiation therapy: Secondary | ICD-10-CM | POA: Diagnosis not present

## 2023-10-27 LAB — CMP (CANCER CENTER ONLY)
ALT: 18 U/L (ref 0–44)
AST: 19 U/L (ref 15–41)
Albumin: 3.8 g/dL (ref 3.5–5.0)
Alkaline Phosphatase: 94 U/L (ref 38–126)
Anion gap: 11 (ref 5–15)
BUN: 14 mg/dL (ref 8–23)
CO2: 25 mmol/L (ref 22–32)
Calcium: 8.7 mg/dL — ABNORMAL LOW (ref 8.9–10.3)
Chloride: 99 mmol/L (ref 98–111)
Creatinine: 0.85 mg/dL (ref 0.44–1.00)
GFR, Estimated: >60 mL/min (ref >60)
Glucose, Bld: 110 mg/dL — ABNORMAL HIGH (ref 70–99)
Potassium: 3.6 mmol/L (ref 3.5–5.1)
Sodium: 135 mmol/L (ref 135–145)
Total Bilirubin: 0.7 mg/dL (ref 0.0–1.2)
Total Protein: 6.7 g/dL (ref 6.5–8.1)

## 2023-10-27 LAB — RAD ONC ARIA SESSION SUMMARY
Course Elapsed Days: 41
Plan Fractions Treated to Date: 29
Plan Prescribed Dose Per Fraction: 2 Gy
Plan Total Fractions Prescribed: 30
Plan Total Prescribed Dose: 60 Gy
Reference Point Dosage Given to Date: 58 Gy
Reference Point Session Dosage Given: 2 Gy
Session Number: 29

## 2023-10-27 LAB — CBC WITH DIFFERENTIAL (CANCER CENTER ONLY)
Abs Immature Granulocytes: 0.06 10*3/uL (ref 0.00–0.07)
Basophils Absolute: 0 10*3/uL (ref 0.0–0.1)
Basophils Relative: 0 %
Eosinophils Absolute: 0 10*3/uL (ref 0.0–0.5)
Eosinophils Relative: 1 %
HCT: 35.8 % — ABNORMAL LOW (ref 36.0–46.0)
Hemoglobin: 12.2 g/dL (ref 12.0–15.0)
Immature Granulocytes: 1 %
Lymphocytes Relative: 3 %
Lymphs Abs: 0.1 10*3/uL — ABNORMAL LOW (ref 0.7–4.0)
MCH: 28.3 pg (ref 26.0–34.0)
MCHC: 34.1 g/dL (ref 30.0–36.0)
MCV: 83.1 fL (ref 80.0–100.0)
Monocytes Absolute: 0.6 10*3/uL (ref 0.1–1.0)
Monocytes Relative: 15 %
Neutro Abs: 3.3 10*3/uL (ref 1.7–7.7)
Neutrophils Relative %: 80 %
Platelet Count: 90 10*3/uL — ABNORMAL LOW (ref 150–400)
RBC: 4.31 MIL/uL (ref 3.87–5.11)
RDW: 13.9 % (ref 11.5–15.5)
WBC Count: 4.2 10*3/uL (ref 4.0–10.5)
nRBC: 0 % (ref 0.0–0.2)

## 2023-10-27 MED ORDER — SODIUM CHLORIDE 0.9 % IV SOLN: INTRAVENOUS | Status: DC

## 2023-10-27 MED ORDER — SODIUM CHLORIDE 0.9 % IV SOLN
45.0000 mg/m2 | Freq: Once | INTRAVENOUS | Status: AC
  Filled 2023-10-27: qty 14

## 2023-10-27 MED ORDER — DIPHENHYDRAMINE HCL 50 MG/ML IJ SOLN
50.0000 mg | Freq: Once | INTRAMUSCULAR | Status: AC
  Filled 2023-10-27: qty 1

## 2023-10-27 MED ORDER — DEXAMETHASONE SODIUM PHOSPHATE 10 MG/ML IJ SOLN
10.0000 mg | Freq: Once | INTRAMUSCULAR | Status: AC
  Filled 2023-10-27: qty 1

## 2023-10-27 MED ORDER — PALONOSETRON HCL INJECTION 0.25 MG/5ML
0.2500 mg | Freq: Once | INTRAVENOUS | Status: AC
  Filled 2023-10-27: qty 5

## 2023-10-27 MED ORDER — SODIUM CHLORIDE 0.9 % IV SOLN
170.0000 mg | Freq: Once | INTRAVENOUS | Status: AC
  Filled 2023-10-27: qty 17

## 2023-10-27 MED ORDER — FAMOTIDINE IN NACL 20-0.9 MG/50ML-% IV SOLN
20.0000 mg | Freq: Once | INTRAVENOUS | Status: AC
  Administered 2023-10-27: 20 mg via INTRAVENOUS
  Filled 2023-10-27: qty 50

## 2023-10-27 NOTE — Patient Instructions (Signed)
 [facility] - A DEPT OF [facility]  Discharge Instructions: Thank you for choosing [facility] to provide your oncology and hematology care.   If you have a lab appointment with the Cancer Center, please go directly to the Cancer Center and check in at the registration area.   Wear comfortable clothing and clothing appropriate for easy access to any Portacath or PICC line.   We strive to give you quality time with your provider. You may need to reschedule your appointment if you arrive late (15 or more minutes).  Arriving late affects you and other patients whose appointments are after yours.  Also, if you miss three or more appointments without notifying the office, you may be dismissed from the clinic at the provider's discretion.      For prescription refill requests, have your pharmacy contact our office and allow 72 hours for refills to be completed.    Today you received the following chemotherapy and/or immunotherapy agents Taxol  and carboplatin        To help prevent nausea and vomiting after your treatment, we encourage you to take your nausea medication as directed.  BELOW ARE SYMPTOMS THAT SHOULD BE REPORTED IMMEDIATELY: *FEVER GREATER THAN 100.4 F (38 C) OR HIGHER *CHILLS OR SWEATING *NAUSEA AND VOMITING THAT IS NOT CONTROLLED WITH YOUR NAUSEA MEDICATION *UNUSUAL SHORTNESS OF BREATH *UNUSUAL BRUISING OR BLEEDING *URINARY PROBLEMS (pain or burning when urinating, or frequent urination) *BOWEL PROBLEMS (unusual diarrhea, constipation, pain near the anus) TENDERNESS IN MOUTH AND THROAT WITH OR WITHOUT PRESENCE OF ULCERS (sore throat, sores in mouth, or a toothache) UNUSUAL RASH, SWELLING OR PAIN  UNUSUAL VAGINAL DISCHARGE OR ITCHING   Items with * indicate a potential emergency and should be followed up as soon as possible or go to the Emergency Department if any problems should occur.  Please show the CHEMOTHERAPY ALERT CARD or  IMMUNOTHERAPY ALERT CARD at check-in to the Emergency Department and triage nurse.  Should you have questions after your visit or need to cancel or reschedule your appointment, please contact [facility] - A DEPT OF [facility]  Dept: [phone]  and follow the prompts.  Office hours are 8:00 a.m. to 4:30 p.m. Monday - Friday. Please note that voicemails left after 4:00 p.m. may not be returned until the following business day.  We are closed weekends and major holidays. You have access to a nurse at all times for urgent questions. Please call the main number to the clinic Dept: [phone] and follow the prompts.   For any non-urgent questions, you may also contact your provider using MyChart. We now offer e-Visits for anyone 13 and older to request care online for non-urgent symptoms. For details visit [email or web address].   Also download the MyChart app! Go to the app store, search "MyChart", open the app, select [facility], and log in with your MyChart username and password.

## 2023-10-28 ENCOUNTER — Ambulatory Visit
Admission: RE | Admit: 2023-10-28 | Discharge: 2023-10-28 | Disposition: A | Discharge status: Home / Self Care | Source: Ambulatory Visit | Attending: Radiation Oncology | Admitting: Radiation Oncology

## 2023-10-28 ENCOUNTER — Other Ambulatory Visit: Payer: Self-pay

## 2023-10-28 DIAGNOSIS — Z510 Encounter for antineoplastic radiation therapy: Secondary | ICD-10-CM | POA: Diagnosis not present

## 2023-10-28 DIAGNOSIS — Z79899 Other long term (current) drug therapy: Secondary | ICD-10-CM | POA: Diagnosis not present

## 2023-10-28 DIAGNOSIS — Z87891 Personal history of nicotine dependence: Secondary | ICD-10-CM | POA: Diagnosis not present

## 2023-10-28 DIAGNOSIS — Z5111 Encounter for antineoplastic chemotherapy: Secondary | ICD-10-CM | POA: Diagnosis not present

## 2023-10-28 DIAGNOSIS — C3432 Malignant neoplasm of lower lobe, left bronchus or lung: Secondary | ICD-10-CM | POA: Diagnosis not present

## 2023-10-28 LAB — RAD ONC ARIA SESSION SUMMARY
Course Elapsed Days: 42
Plan Fractions Treated to Date: 30
Plan Prescribed Dose Per Fraction: 2 Gy
Plan Total Fractions Prescribed: 30
Plan Total Prescribed Dose: 60 Gy
Reference Point Dosage Given to Date: 60 Gy
Reference Point Session Dosage Given: 2 Gy
Session Number: 30

## 2023-10-29 ENCOUNTER — Other Ambulatory Visit: Payer: Self-pay

## 2023-10-29 ENCOUNTER — Ambulatory Visit
Admission: RE | Admit: 2023-10-29 | Discharge: 2023-10-29 | Disposition: A | Discharge status: Home / Self Care | Source: Ambulatory Visit | Attending: Radiation Oncology | Admitting: Radiation Oncology

## 2023-10-29 DIAGNOSIS — Z79899 Other long term (current) drug therapy: Secondary | ICD-10-CM | POA: Diagnosis not present

## 2023-10-29 DIAGNOSIS — Z510 Encounter for antineoplastic radiation therapy: Secondary | ICD-10-CM | POA: Diagnosis not present

## 2023-10-29 DIAGNOSIS — C3432 Malignant neoplasm of lower lobe, left bronchus or lung: Secondary | ICD-10-CM | POA: Diagnosis not present

## 2023-10-29 DIAGNOSIS — Z5111 Encounter for antineoplastic chemotherapy: Secondary | ICD-10-CM | POA: Diagnosis not present

## 2023-10-29 DIAGNOSIS — Z87891 Personal history of nicotine dependence: Secondary | ICD-10-CM | POA: Diagnosis not present

## 2023-10-29 LAB — RAD ONC ARIA SESSION SUMMARY
Course Elapsed Days: 43
Plan Fractions Treated to Date: 1
Plan Prescribed Dose Per Fraction: 2 Gy
Plan Total Fractions Prescribed: 3
Plan Total Prescribed Dose: 6 Gy
Reference Point Dosage Given to Date: 2 Gy
Reference Point Session Dosage Given: 2 Gy
Session Number: 31

## 2023-10-30 ENCOUNTER — Ambulatory Visit
Admission: RE | Admit: 2023-10-30 | Discharge: 2023-10-30 | Disposition: A | Discharge status: Home / Self Care | Source: Ambulatory Visit | Attending: Radiation Oncology | Admitting: Radiation Oncology

## 2023-10-30 ENCOUNTER — Other Ambulatory Visit: Payer: Self-pay

## 2023-10-30 DIAGNOSIS — Z5111 Encounter for antineoplastic chemotherapy: Secondary | ICD-10-CM | POA: Diagnosis not present

## 2023-10-30 DIAGNOSIS — C3432 Malignant neoplasm of lower lobe, left bronchus or lung: Secondary | ICD-10-CM | POA: Diagnosis not present

## 2023-10-30 DIAGNOSIS — Z510 Encounter for antineoplastic radiation therapy: Secondary | ICD-10-CM | POA: Diagnosis not present

## 2023-10-30 DIAGNOSIS — Z79899 Other long term (current) drug therapy: Secondary | ICD-10-CM | POA: Diagnosis not present

## 2023-10-30 DIAGNOSIS — Z87891 Personal history of nicotine dependence: Secondary | ICD-10-CM | POA: Diagnosis not present

## 2023-10-30 LAB — RAD ONC ARIA SESSION SUMMARY
Course Elapsed Days: 44
Plan Fractions Treated to Date: 2
Plan Prescribed Dose Per Fraction: 2 Gy
Plan Total Fractions Prescribed: 3
Plan Total Prescribed Dose: 6 Gy
Reference Point Dosage Given to Date: 4 Gy
Reference Point Session Dosage Given: 2 Gy
Session Number: 32

## 2023-10-31 ENCOUNTER — Ambulatory Visit
Admission: RE | Admit: 2023-10-31 | Discharge: 2023-10-31 | Disposition: A | Discharge status: Home / Self Care | Source: Ambulatory Visit | Attending: Radiation Oncology | Admitting: Radiation Oncology

## 2023-10-31 ENCOUNTER — Other Ambulatory Visit: Payer: Self-pay

## 2023-10-31 DIAGNOSIS — Z510 Encounter for antineoplastic radiation therapy: Secondary | ICD-10-CM | POA: Diagnosis not present

## 2023-10-31 DIAGNOSIS — Z79899 Other long term (current) drug therapy: Secondary | ICD-10-CM | POA: Diagnosis not present

## 2023-10-31 DIAGNOSIS — C3432 Malignant neoplasm of lower lobe, left bronchus or lung: Secondary | ICD-10-CM | POA: Diagnosis not present

## 2023-10-31 DIAGNOSIS — Z87891 Personal history of nicotine dependence: Secondary | ICD-10-CM | POA: Diagnosis not present

## 2023-10-31 DIAGNOSIS — Z5111 Encounter for antineoplastic chemotherapy: Secondary | ICD-10-CM | POA: Diagnosis not present

## 2023-10-31 LAB — RAD ONC ARIA SESSION SUMMARY
Course Elapsed Days: 45
Plan Fractions Treated to Date: 3
Plan Prescribed Dose Per Fraction: 2 Gy
Plan Total Fractions Prescribed: 3
Plan Total Prescribed Dose: 6 Gy
Reference Point Dosage Given to Date: 6 Gy
Reference Point Session Dosage Given: 2 Gy
Session Number: 33

## 2023-11-03 NOTE — Radiation Completion Notes (Addendum)
  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 She developed pain within the treatment field, mild fatigue, anticipated skin changes, and pain/difficulty with swallowing. She was given carafate to help with her esophagitis.   The patient will return in one month and will continue follow up with Dr. Sherrod as well.      Ronita Due, PA-C

## 2023-11-17 ENCOUNTER — Inpatient Hospital Stay

## 2023-11-17 ENCOUNTER — Ambulatory Visit (HOSPITAL_COMMUNITY)

## 2023-11-17 ENCOUNTER — Ambulatory Visit (HOSPITAL_BASED_OUTPATIENT_CLINIC_OR_DEPARTMENT_OTHER)
Admission: RE | Admit: 2023-11-17 | Discharge: 2023-11-17 | Disposition: A | Discharge status: Home / Self Care | Source: Ambulatory Visit | Attending: Internal Medicine | Admitting: Internal Medicine

## 2023-11-17 DIAGNOSIS — Z7982 Long term (current) use of aspirin: Secondary | ICD-10-CM | POA: Diagnosis not present

## 2023-11-17 DIAGNOSIS — I5042 Chronic combined systolic (congestive) and diastolic (congestive) heart failure: Secondary | ICD-10-CM | POA: Diagnosis not present

## 2023-11-17 DIAGNOSIS — E785 Hyperlipidemia, unspecified: Secondary | ICD-10-CM | POA: Diagnosis not present

## 2023-11-17 DIAGNOSIS — J9601 Acute respiratory failure with hypoxia: Secondary | ICD-10-CM | POA: Diagnosis not present

## 2023-11-17 DIAGNOSIS — E1122 Type 2 diabetes mellitus with diabetic chronic kidney disease: Secondary | ICD-10-CM | POA: Diagnosis not present

## 2023-11-17 DIAGNOSIS — C3432 Malignant neoplasm of lower lobe, left bronchus or lung: Secondary | ICD-10-CM | POA: Diagnosis not present

## 2023-11-17 DIAGNOSIS — Z794 Long term (current) use of insulin: Secondary | ICD-10-CM | POA: Diagnosis not present

## 2023-11-17 DIAGNOSIS — R918 Other nonspecific abnormal finding of lung field: Secondary | ICD-10-CM | POA: Diagnosis not present

## 2023-11-17 DIAGNOSIS — E871 Hypo-osmolality and hyponatremia: Secondary | ICD-10-CM | POA: Diagnosis not present

## 2023-11-17 DIAGNOSIS — D630 Anemia in neoplastic disease: Secondary | ICD-10-CM | POA: Diagnosis not present

## 2023-11-17 DIAGNOSIS — D84821 Immunodeficiency due to drugs: Secondary | ICD-10-CM | POA: Diagnosis not present

## 2023-11-17 DIAGNOSIS — Z940 Kidney transplant status: Secondary | ICD-10-CM | POA: Diagnosis not present

## 2023-11-17 DIAGNOSIS — I110 Hypertensive heart disease with heart failure: Secondary | ICD-10-CM | POA: Diagnosis not present

## 2023-11-17 DIAGNOSIS — J440 Chronic obstructive pulmonary disease with acute lower respiratory infection: Secondary | ICD-10-CM | POA: Diagnosis not present

## 2023-11-17 DIAGNOSIS — I509 Heart failure, unspecified: Secondary | ICD-10-CM | POA: Diagnosis not present

## 2023-11-17 DIAGNOSIS — R0602 Shortness of breath: Secondary | ICD-10-CM | POA: Diagnosis not present

## 2023-11-17 DIAGNOSIS — Z992 Dependence on renal dialysis: Secondary | ICD-10-CM | POA: Diagnosis not present

## 2023-11-17 DIAGNOSIS — C3490 Malignant neoplasm of unspecified part of unspecified bronchus or lung: Secondary | ICD-10-CM | POA: Insufficient documentation

## 2023-11-17 DIAGNOSIS — Z7984 Long term (current) use of oral hypoglycemic drugs: Secondary | ICD-10-CM | POA: Diagnosis not present

## 2023-11-17 DIAGNOSIS — D696 Thrombocytopenia, unspecified: Secondary | ICD-10-CM | POA: Diagnosis not present

## 2023-11-17 DIAGNOSIS — I700 Atherosclerosis of aorta: Secondary | ICD-10-CM | POA: Diagnosis not present

## 2023-11-17 DIAGNOSIS — R0603 Acute respiratory distress: Secondary | ICD-10-CM | POA: Diagnosis not present

## 2023-11-17 DIAGNOSIS — J700 Acute pulmonary manifestations due to radiation: Secondary | ICD-10-CM | POA: Diagnosis not present

## 2023-11-17 DIAGNOSIS — J441 Chronic obstructive pulmonary disease with (acute) exacerbation: Secondary | ICD-10-CM | POA: Diagnosis not present

## 2023-11-17 DIAGNOSIS — D649 Anemia, unspecified: Secondary | ICD-10-CM | POA: Diagnosis not present

## 2023-11-17 DIAGNOSIS — J438 Other emphysema: Secondary | ICD-10-CM | POA: Diagnosis not present

## 2023-11-17 DIAGNOSIS — J189 Pneumonia, unspecified organism: Secondary | ICD-10-CM | POA: Diagnosis not present

## 2023-11-17 DIAGNOSIS — J432 Centrilobular emphysema: Secondary | ICD-10-CM | POA: Diagnosis not present

## 2023-11-17 DIAGNOSIS — I2510 Atherosclerotic heart disease of native coronary artery without angina pectoris: Secondary | ICD-10-CM | POA: Diagnosis not present

## 2023-11-17 DIAGNOSIS — J439 Emphysema, unspecified: Secondary | ICD-10-CM | POA: Diagnosis not present

## 2023-11-17 DIAGNOSIS — Z87891 Personal history of nicotine dependence: Secondary | ICD-10-CM | POA: Insufficient documentation

## 2023-11-17 DIAGNOSIS — T451X5A Adverse effect of antineoplastic and immunosuppressive drugs, initial encounter: Secondary | ICD-10-CM | POA: Diagnosis not present

## 2023-11-17 DIAGNOSIS — Z7960 Long term (current) use of unspecified immunomodulators and immunosuppressants: Secondary | ICD-10-CM | POA: Diagnosis not present

## 2023-11-17 LAB — CBC WITH DIFFERENTIAL (CANCER CENTER ONLY)
Abs Immature Granulocytes: 0.22 K/uL — ABNORMAL HIGH (ref 0.00–0.07)
Basophils Absolute: 0 K/uL (ref 0.0–0.1)
Basophils Relative: 0 %
Eosinophils Absolute: 0 K/uL (ref 0.0–0.5)
Eosinophils Relative: 0 %
HCT: 32.4 % — ABNORMAL LOW (ref 36.0–46.0)
Hemoglobin: 10.8 g/dL — ABNORMAL LOW (ref 12.0–15.0)
Immature Granulocytes: 2 %
Lymphocytes Relative: 1 %
Lymphs Abs: 0.1 K/uL — ABNORMAL LOW (ref 0.7–4.0)
MCH: 27.1 pg (ref 26.0–34.0)
MCHC: 33.3 g/dL (ref 30.0–36.0)
MCV: 81.4 fL (ref 80.0–100.0)
Monocytes Absolute: 1.1 K/uL — ABNORMAL HIGH (ref 0.1–1.0)
Monocytes Relative: 8 %
Neutro Abs: 11.9 K/uL — ABNORMAL HIGH (ref 1.7–7.7)
Neutrophils Relative %: 89 %
Platelet Count: 227 K/uL (ref 150–400)
RBC: 3.98 MIL/uL (ref 3.87–5.11)
RDW: 14.3 % (ref 11.5–15.5)
WBC Count: 13.5 K/uL — ABNORMAL HIGH (ref 4.0–10.5)
nRBC: 0.1 % (ref 0.0–0.2)

## 2023-11-17 LAB — CMP (CANCER CENTER ONLY)
ALT: 42 U/L (ref 0–44)
AST: 37 U/L (ref 15–41)
Albumin: 3.1 g/dL — ABNORMAL LOW (ref 3.5–5.0)
Alkaline Phosphatase: 100 U/L (ref 38–126)
Anion gap: 11 (ref 5–15)
BUN: 22 mg/dL (ref 8–23)
CO2: 29 mmol/L (ref 22–32)
Calcium: 9.3 mg/dL (ref 8.9–10.3)
Chloride: 90 mmol/L — ABNORMAL LOW (ref 98–111)
Creatinine: 0.7 mg/dL (ref 0.44–1.00)
GFR, Estimated: >60 mL/min (ref >60)
Glucose, Bld: 251 mg/dL — ABNORMAL HIGH (ref 70–99)
Potassium: 3.7 mmol/L (ref 3.5–5.1)
Sodium: 130 mmol/L — ABNORMAL LOW (ref 135–145)
Total Bilirubin: 0.6 mg/dL (ref 0.0–1.2)
Total Protein: 6.6 g/dL (ref 6.5–8.1)

## 2023-11-17 MED ORDER — IOHEXOL 300 MG/ML  SOLN
75.0000 mL | Freq: Once | INTRAMUSCULAR | Status: AC | PRN
  Administered 2023-11-17: 75 mL via INTRAVENOUS

## 2023-11-17 NOTE — Progress Notes (Signed)
 [facility]  Follow-Up Note  Non Kidney Transplant Providers: Ransom Other, MD  Cardiologist: Victory Sharps, MD  Site of Visit:  Telehealth  This video encounter was conducted with the patient's (or proxy's) verbal consent via secure, interactive audio and video telecommunications while in clinic/office/hospital.  The patient (or proxy) was instructed to have this encounter in a suitably private space and to only have persons present to whom they give permission to participate. In addition, patient identity was confirmed by use of name plus an additional identifier.  This visit was coded based on medical decision making (MDM).  Reason for Visit:  Kidney Transplant Follow Up  Identification/ HPI: Pamela [name] is a 73 y.o. 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  INTERVAL HISTORY Pamela [name] presents today to transplant clinic via telehealth for follow up and immunosuppression management.   Since seeing Dr Ransom 6 months ago she was diagnosed with stage IIIa non-small cell lung cancer, requiring chemoradiation.  Completed 7 weeks of radiation and 7 weeks of chemo. Done at the end of June.  Getting labs today.  Tomorrow getting CT lung with contrast.   Following that diagnosis, her MMF was d/c, and her Envarsus  was supposed to be switched to sirolimus  - but she has not heard back and has been taking both, as well as the prednisone .   She denies LE  edema.   Has not been checking her Bps at home, but when she gets them done at her frequent medical appointments they are always in the 135-140/70s range.  No dizziness or lightheadedness.   Has been having horrible nausea from treatment. A few vomiting episodes.  Appetite is down. Lost 20lbs.  Drinking protein shakes which are affecting her BS.  Denies hypoglycemic episodes.   No fevers/ chills.   She reports no recent issues with her urine output. Urine looks clear, and does not look bubbly, frothy. No pain or discomfort on urination.   Reports her breathing is not good at all walking/ sitting.  Not getting good sleep.  Going to ask her oncologist about getting O2 at home.    Admission in May for abnormal LFTs. MRCP demonstrated marked intra and extrahepatic bile duct dilation without obstructing mass or choledocholithiasis. There is a 4 x 6 mm stone in the left main bile duct. Findings favoring ampullary stenosis. Pt underwent ERCP and sphincterotomy. No stent needed.   We reviewed her labs together which she has been doing for her oncologist - and encouraged her that her kidney function remains excellent and stable.   She reports she is taking all her medication as prescribed with no missed doses or rationing. Immunosuppression: She is on Envarsus  1 mg daily, sirolimus  2mg  daily, and prednisone  5 mg daily. Prophylaxis: Not needed at this time post-transplant.  A review of systems was performed, all pertinent positive and negative findings are in the HPI, all other systems negative.   Medical History: Past Medical History:  Diagnosis Date  . Diabetes mellitus type 2, uncomplicated (CMS/HHS-HCC)   . Hypertension   .  Kidney disease      Problem List  Date Reviewed: 02/05/2018        ICD-10-CM Priority Class Noted - Resolved Diagnosed   Chronic prescription benzodiazepine use (Chronic) Z79.899   12/16/2017 - Present    Heart failure of unknown type (CMS/HHS-HCC) I50.9    12/15/2017 - Present    S/P kidney transplant (HHS-HCC) Z94.0   12/15/2017 - Present    Overview Addendum 12/16/2017 10:57 AM by Marlowe Woodie Garre, NP  Date of Transplant = 12/15/2017  ABO (Donor/Recipient) = compatible Blood type: A/A DonorType: DBD Allograft type:Right kidney KDPI = 41% Donor anatomy: single artery, vein and ureter Donor Kidney Bx: 102 glomeruli seen, 4 sclerosed and 4% Sclerosis Allograft injury/complications: None Nephroureteral stent: placed at the time of surgery  Pump #s:Flow = 75, Pressure =  35 and RI =  0.39 Pump duration =   4 hours Cold ischemia time: 41 hours (per [facility] report) Warm ischemia time: 21  minutes  Induction agent Thymoglobulin PRA pre-transplant = Class I 0 % and Class II 0%  CMV D/R:+/+ EBV D/R: +/+ PHS IR: yes, pre-op  informed consent obtained - will require testing per program guidelines during the first 1 year post-transplant      Need for prophylactic antibiotic Z79.2   12/15/2017 - Present    Overview Signed 12/15/2017 11:20 PM by Merced Lauraine Craven, PA  CMV +/+      Immunosuppressed status (CMS/HHS-HCC) D84.9   12/15/2017 - Present    Overview Signed 12/15/2017 11:20 PM by Merced Lauraine Craven, PA  thymo induction for long cold time; PRA 0/0      Status post placement of ureteral stent Z96.0   12/15/2017 - Present    Steroid-induced hyperglycemia R73.9, T38.0X5A   12/15/2017 - Present    COPD (chronic obstructive pulmonary disease) (CMS/HHS-HCC) J44.9   02/29/2016 - Present    DM (diabetes mellitus) (CMS/HHS-HCC) E11.9   02/01/2015 - Present    Essential hypertension I10   03/25/2014 - Present    Hyperlipidemia E78.5   03/25/2014 - Present    RESOLVED: ESRD on dialysis (CMS/HHS-HCC) N18.6, Z99.2   01/25/2015 - 12/16/2017     Surgical History: Past Surgical History:  Procedure Laterality Date  . TRANSPLANT KIDNEY Right 12/15/2017   Procedure: RENAL ALLOTRANSPLANTATION, IMPLANTATION OF GRAFT; WITHOUT RECIPIENT NEPHRECTOMY;   Surgeon: Jaquelyn Rosette Fanti, MD;  Location: [facility];  Service: General Surgery;  Laterality: Right;     Medications: Outpatient Encounter Medications as of 11/17/2023  Medication Sig Dispense Refill  . ACCU-CHEK AVIVA PLUS TEST STRP test strip 4 (four) times daily.    . acetaminophen  (TYLENOL ) 650 MG ER tablet Take 650 mg by mouth every 8 (eight) hours as needed for Pain    . amLODIPine  (NORVASC ) 10 MG tablet TAKE 1 TABLET BY MOUTH ONCE DAILY 90 tablet 3  . aspirin  81 MG EC tablet Take 1 tablet (81 mg total) by mouth once daily 120 tablet 11  . atorvastatin  (LIPITOR) 10 MG tablet TAKE 1 TABLET BY MOUTH ONCE DAILY 90 tablet 3  . carvediloL  (COREG ) 6.25 MG tablet Take 2 tablets (12.5 mg total) by mouth 2 (two) times daily with meals 60 tablet 11  . ENVARSUS  XR 1 mg extended-release tablet Take 1 tablet (1 mg total) by mouth every morning before breakfast 30 tablet 11  . glimepiride  (AMARYL ) 2 MG tablet Take 2 mg by mouth daily with breakfast    . insulin  GLARGINE (LANTUS ) injection (concentration 100 units/mL) Inject  7 Units subcutaneously nightly (Patient taking differently: Inject subcutaneously at bedtime 40 units in AM and 10 units in PM) 10 mL 12  . insulin  glargine,hum.rec.anlog (LANTUS  U-100 INSULIN  SUBQ) Inject subcutaneously Lantus  40 in am and 10 units pm    . insulin  LISPRO (HUMALOG KWIKPEN) pen injector (concentration 100 units/mL) Inject 6 Units subcutaneously 3 (three) times daily with meals 15 mL 0  . insulin  syringe-needle U-100 0.3 mL 31 gauge x 15/64 Syrg Use 1 Syringe 2 (two) times daily before meals 100 Syringe 3  . ipratropium-albuterol  (DUO-NEB) nebulizer solution Inhale 3 mLs by nebulization 4 (four) times daily as needed for Shortness of Breath (wheezing) 360 mL 0  . latanoprost  (XALATAN ) 0.005 % ophthalmic solution INSTILL ONE DROP IN BOTH  EYES NIGHTLY (REFRIGERATE  UNTIL FIRST OPENED FOR USE) 7.5 mL 3  . mycophenolate (CELLCEPT) 250 mg capsule Take 2 capsules (500  mg total) by mouth every morning AND 1 capsule (250 mg total) nightly. 90 capsule 11  . nitroGLYcerin  (NITROSTAT ) 0.4 MG SL tablet Place under the tongue as needed.    . pen needle, diabetic 32 gauge x 5/16 needle Use as directed 100 each 12  . predniSONE  (DELTASONE ) 5 MG tablet Take 1 tablet (5 mg total) by mouth once daily 30 tablet 11  . sirolimus  (RAPAMUNE ) 1 MG tablet Take 2 tablets (2 mg total) by mouth once daily 60 tablet 11  . umeclidinium-vilanterol (ANORO ELLIPTA) 62.5-25 mcg/actuation inhaler Inhale 1 inhalation into the lungs once daily 84 each 2   No facility-administered encounter medications on file as of 11/17/2023.    Allergies: No Known Allergies   Social History: Social History   Socioeconomic History  . Marital status: Married  Tobacco Use  . Smoking status: Former    Current packs/day: 0.00    Average packs/day: 0.8 packs/day for 20.0 years (15.0 ttl pk-yrs)    Types: Cigarettes    Start date: 02/28/1973    Quit date: 02/28/1993    Years since quitting: 30.7  . Smokeless tobacco: Never  Substance and Sexual Activity  . Alcohol use: No  . Drug use: No  . Sexual activity: Not Currently    Partners: Male   Social Drivers of Health   Food Insecurity: No Food Insecurity (09/29/2023)   Received from [facility]   Hunger Vital Sign   . Within the past 12 months, you worried that your food would run out before you got the money to buy more.: Never true   . Within the past 12 months, the food you bought just didn't last and you didn't have money to get more.: Never true  Transportation Needs: No Transportation Needs (09/29/2023)   Received from [facility] - Transportation   . Lack of Transportation (Medical): No   . Lack of Transportation (Non-Medical): No  Social Connections: Socially Integrated (09/29/2023)   Received [facility]   Social Connection and Isolation Panel   . In a typical week, how many times do you talk on the phone with  family, friends, or neighbors?: More than three times a week   . How often do you get together with friends or relatives?: More than three times a week   . How often do you attend church or religious services?: More than 4 times per year   . Do you belong to any clubs or organizations such as church groups, unions, fraternal or athletic groups, or school groups?: Yes   . How often do  you attend meetings of the clubs or organizations you belong to?: More than 4 times per year   . Are you married, widowed, divorced, separated, never married, or living with a partner?: Married  Housing Stability: Unknown (05/19/2023)   Housing Stability Vital Sign   . Homeless in the Last Year: No     Family History: No family history on file.   Physical Examination: There were no vitals filed for this visit. Visit done via Telehealth, and so physical exam is limited.  The patient is awake, alert, oriented & in no acute distress.  The mood & affect are noted to be appropriate. No apparent dyspnea and completes sentences.  Head is normocephalic.      Neuro:  Non-focal.  Alert and oriented to name, place, and surroundings.    Labs: I have trended the patient's SCr, hbg, WBC, calcineurin inhibitor trough, lipid panel, iPTH, 25 vitamin D, urinalysis, spot protein to creatinine, CMV & BK screens.  I have reviewed the last two clinic notes.  I have reviewed Ms. Harral's immunization history, BMI, smoking history, & age appropriate cancer screening.  These results have been edited in Ms. Pattillo's electronic record.  Last Set of Labs: No visits with results within 2 Day(s) from this visit.  Latest known visit with results is:  Orders Only on 08/23/2022  Component Date Value  . Glucose Random - [facility] 08/19/2022 84   . Blood Urea  Nitrogen - [facility] 08/19/2022 14   . Creatinine  - [facility] 08/19/2022 1.02 (H)   . EGFR (CKD-EPI 2021) - [facility] 08/19/2022 58 (L)   . Bun/Creatinine Ratio - L* 08/19/2022 14   . Sodium  - [facility] 08/19/2022 138   . Potassium - [facility] 08/19/2022 4.0   . Chloride - [facility] 08/19/2022 97   . Carbon Dioxide - [facility] 08/19/2022 22   . White Blood Cell Count -* 08/19/2022 11.1 (H)   . Red Blood Cell Count - L* 08/19/2022 5.12   . Hemoglobin - [facility] 08/19/2022 14.6   . Hematocrit - [facility] 08/19/2022 43.7   . MCV - [facility] 08/19/2022 85   . MCH  - [facility] 08/19/2022 28.5   . MCHC - [facility] 08/19/2022 33.4   . RDW - [facility] 08/19/2022 13.1   . Platelets - [facility] 08/19/2022 250   . Neutrophils - [facility] 08/19/2022 80   . LYMPHS -  [facility] 08/19/2022 8   . Monocytes - [facility] 08/19/2022 9   . Eos - [facility] 08/19/2022 1   . Basos - [facility] 08/19/2022 1   . Neutrophils (Absolute) -* 08/19/2022 8.8 (H)   . Lymphs (Absolute) - [facility] 08/19/2022 0.9   . Monocytes(Absolute) - [facility] 08/19/2022 1.0 (H)   . Eos (Absolute) - [facility] 08/19/2022 0.2   . Baso (Absolute) - [facility] 08/19/2022 0.1   . Immature Granulocytes - * 08/19/2022 1   . Immature Grans (Abs) - L* 08/19/2022 0.1   . Tacrolimus  (FK506), [facility]* 08/19/2022 6.7   . Creatinine, Urine - [facility] 08/19/2022 57.0   . Protein, Ur - [facility] 08/19/2022 8.7   . Protein/Creat Ratio - [facility] 08/19/2022 153   . Specific Gravity - [facility] 08/19/2022 1.012   . pH - [facility] 08/19/2022 6.5   . Color - [facility] 08/19/2022 Yellow   . Appearance - [facility] 08/19/2022 Clear   . WBC Esterase - [facility] 08/19/2022 Negative   . Protein   - [facility] 08/19/2022 Negative   . Glucose UA - [facility] 08/19/2022 Trace (!)   . Ketones -  [facility] 08/19/2022 Negative   . Occult Blood - [facility] 08/19/2022 Negative   . Bilirubin   - [facility] 08/19/2022 Negative   . Urobilinogen, Semi-Qn - * 08/19/2022 0.2   . Nitrite, Urine - [facility] 08/19/2022 Negative   . Microscopic Examination * 08/19/2022 Comment      Assessment & Plan: Status post DDK on 12/15/2017: (The patient's pre-transplant PRA was 0/0, CIT was greater than 40 hours, WIT 21 minutes, thymoglobulin  induction for prolonged CIT, KDPI 41%, CMV +/+, EBV +/+).  The patient has had excellent graft function pretty much since transplant with serum creatinine right under or at 1mg /dL giving her clearance of around 70 which is great at almost 6 years post txp. Her UA has also been pretty bland with no significant proteinuria. She has been getting regular labs through her cancer center given recent diagnosis and treatment of lung cancer, but will ask her ROYCE Santee for help in getting some urine studies, especially now as she is also on rapa.  Plan to see her back in 6 months with labs in btw.   Immunosuppression: Thymoglobulin induction for prolonged CIT, although not sensitized pre-transplant. She was on long acting tac, low dose MMF (for history of BK) and pred until early May when she informed us  of her new cancer diagnosis. MMF was d/c and she was initiated on rapa with plans to reach therapeutic level and then stop CNI. However, seems there was a miscommunication and drug was never stop and drug levels were never done. Will ask Santee to set up labs for her this week, and make changes accordingly.   Prophylaxis: Completed. No need to check CMV or BK unless clinically indicated noting her time from transplant.  Hypertension: Blood pressure seems to be well controlled at home and here. She reports 130s over 70s mmHg. No lower extremity edema. No orthopnea or PND. No changes made today to her regimen of Coreg  and amlodipine .  Diabetes: She is insulin  dependent. She follows with her PCP.   Stage 3a non small cell lung cancer Diagnosed around April 2025. Completed 7 weeks of chemo and radiation. Is not back to herself as can be expected. Getting f/u and CT imaging tomorrow. Follows locally for this.   Health Maintenance: Immunization History  Administered Date(s) Administered  . COVID-19 Pfizer Monovalent Vaccine 01/04/2020, 10/02/2020  . COVID-19 Pfizer Monovalent Vaccine (original formulation)  06/19/2019, 07/13/2019, 01/04/2020, 10/02/2020, 01/30/2021  . COVID-19 vaccine >12 years (Pfizer-Biontech, Bivalent) IM Injection 30 mcg/0.3mL 01/30/2021  . DT (6WK- <71YR) VACCINE 05/31/2003  . Flu Vaccine HD-IIV3 High Dose, IM PF(74yo+)(Fluzone) 04/21/2018  . Flu Vaccine IIV3, IM with Pres (60MO+)(Afluria, Fluzone) 02/14/2009, 03/07/2011, 02/24/2013, 02/24/2014, 01/21/2015, 02/13/2016  . Flu Vaccine allV3 adjuv,IM PF(65+)(Fluad ) 01/30/2021  . HEP B (>=11YR) VACCINE (ENGERIX-B/RECOMBIVAX) 04/28/2015, 05/26/2015, 06/23/2015, 11/24/2015, 06/28/2016, 07/26/2016, 08/23/2016, 12/20/2016  . Influenza IIV4, High Dose IM (65 Yr+) (FLUZONE QUAD) 01/28/2017, 01/28/2019, 02/09/2020  . Influenza IIV4, IM pres-free 02/28/2010, 02/24/2012, 01/21/2015  . Influenza, H1N1-09, unspecified 04/21/2008  . PNEUMOCOCCAL (PCV13) (BIRTH-6YR) VACCINE (PREVNAR 13) 07/25/2015, 08/11/2015  . PNEUMOCOCCAL (PPSV23)(>=65YRS -OR- >=2 YRS WITH RISK) VACCINE (PNEUMOVAX 23) 10/26/2007, 01/05/2016  . TDAP (>=71YR) VACCINE (ADACEL/BOOSTRIX) 06/07/2013  . Varicella zoster (Zostavax) 05/24/2010, 02/09/2020, 08/15/2020  Immunizations: Rec she continues to be UTD with her vaccinations.  Colonoscopy: not discussed  Pap: not discussed  Mammogram: not discussed  Derm: Not discussed.    Lipids:   Last Lipids: No results found for: CHOLTOTAL, LDLCALC, HDL, TRIG   Bone Mineral Metabolism:  Lab Results  Component Value Date   PTH 113 (H) 10/26/2019   CALCIUM  10.4 (H) 10/26/2019   PHOS 4.2 10/26/2019    Most recent 25 OH Vitamin D value Lab Results  Component Value Date   VITD 46 10/26/2019    Disposition & Patient Instructions I am sorry to hear everything that you are going through, and hope CT results will be good.   Your kidney function continues to be excellent through all of this!!  - No changes to your medication following today's visit - You will need to go to [facility] this week to get labs for us  including  medication levels of both envarsus  and sirolimus .  - Following the labs, we will call you with any changes that you may need.  - We recommend you keep up with your vaccinations - Continue to monitor your blood pressures for goal of < 130/80, and as tolerated.   - Labs every 2 months, and we will see you in 6 months, or earlier if needed.    No orders of the defined types were placed in this encounter.   Attestation Statement:   I personally performed the service. (TP)  Goni Micky Cumber, MD  Transplant Nephrology

## 2023-11-18 ENCOUNTER — Ambulatory Visit (HOSPITAL_BASED_OUTPATIENT_CLINIC_OR_DEPARTMENT_OTHER)

## 2023-11-18 ENCOUNTER — Ambulatory Visit (HOSPITAL_COMMUNITY)

## 2023-11-19 ENCOUNTER — Emergency Department (HOSPITAL_COMMUNITY)

## 2023-11-19 ENCOUNTER — Other Ambulatory Visit: Payer: Self-pay

## 2023-11-19 ENCOUNTER — Encounter (HOSPITAL_COMMUNITY): Payer: Self-pay | Admitting: Internal Medicine

## 2023-11-19 ENCOUNTER — Telehealth: Payer: Self-pay

## 2023-11-19 ENCOUNTER — Observation Stay (HOSPITAL_COMMUNITY)

## 2023-11-19 ENCOUNTER — Inpatient Hospital Stay (HOSPITAL_COMMUNITY)
Admission: EM | Admit: 2023-11-19 | Discharge: 2023-12-02 | DRG: 205 | Disposition: A | Discharge status: Home / Self Care | Attending: Family Medicine | Admitting: Family Medicine

## 2023-11-19 DIAGNOSIS — J441 Chronic obstructive pulmonary disease with (acute) exacerbation: Principal | ICD-10-CM | POA: Diagnosis present

## 2023-11-19 DIAGNOSIS — Z923 Personal history of irradiation: Secondary | ICD-10-CM

## 2023-11-19 DIAGNOSIS — E871 Hypo-osmolality and hyponatremia: Secondary | ICD-10-CM | POA: Diagnosis present

## 2023-11-19 DIAGNOSIS — Z79899 Other long term (current) drug therapy: Secondary | ICD-10-CM

## 2023-11-19 DIAGNOSIS — Y842 Radiological procedure and radiotherapy as the cause of abnormal reaction of the patient, or of later complication, without mention of misadventure at the time of the procedure: Secondary | ICD-10-CM | POA: Diagnosis present

## 2023-11-19 DIAGNOSIS — I509 Heart failure, unspecified: Secondary | ICD-10-CM

## 2023-11-19 DIAGNOSIS — T451X5A Adverse effect of antineoplastic and immunosuppressive drugs, initial encounter: Secondary | ICD-10-CM | POA: Diagnosis present

## 2023-11-19 DIAGNOSIS — Z9221 Personal history of antineoplastic chemotherapy: Secondary | ICD-10-CM

## 2023-11-19 DIAGNOSIS — D630 Anemia in neoplastic disease: Secondary | ICD-10-CM | POA: Diagnosis present

## 2023-11-19 DIAGNOSIS — E1122 Type 2 diabetes mellitus with diabetic chronic kidney disease: Secondary | ICD-10-CM | POA: Diagnosis present

## 2023-11-19 DIAGNOSIS — Z803 Family history of malignant neoplasm of breast: Secondary | ICD-10-CM

## 2023-11-19 DIAGNOSIS — D84821 Immunodeficiency due to drugs: Secondary | ICD-10-CM | POA: Diagnosis present

## 2023-11-19 DIAGNOSIS — Z7984 Long term (current) use of oral hypoglycemic drugs: Secondary | ICD-10-CM

## 2023-11-19 DIAGNOSIS — Z940 Kidney transplant status: Secondary | ICD-10-CM

## 2023-11-19 DIAGNOSIS — Z1152 Encounter for screening for COVID-19: Secondary | ICD-10-CM

## 2023-11-19 DIAGNOSIS — J700 Acute pulmonary manifestations due to radiation: Principal | ICD-10-CM | POA: Diagnosis present

## 2023-11-19 DIAGNOSIS — C3432 Malignant neoplasm of lower lobe, left bronchus or lung: Secondary | ICD-10-CM | POA: Diagnosis present

## 2023-11-19 DIAGNOSIS — J438 Other emphysema: Secondary | ICD-10-CM | POA: Diagnosis present

## 2023-11-19 DIAGNOSIS — J440 Chronic obstructive pulmonary disease with acute lower respiratory infection: Secondary | ICD-10-CM | POA: Diagnosis present

## 2023-11-19 DIAGNOSIS — I2510 Atherosclerotic heart disease of native coronary artery without angina pectoris: Secondary | ICD-10-CM | POA: Diagnosis present

## 2023-11-19 DIAGNOSIS — Z955 Presence of coronary angioplasty implant and graft: Secondary | ICD-10-CM

## 2023-11-19 DIAGNOSIS — E785 Hyperlipidemia, unspecified: Secondary | ICD-10-CM | POA: Diagnosis present

## 2023-11-19 DIAGNOSIS — C3492 Malignant neoplasm of unspecified part of left bronchus or lung: Secondary | ICD-10-CM | POA: Diagnosis not present

## 2023-11-19 DIAGNOSIS — D696 Thrombocytopenia, unspecified: Secondary | ICD-10-CM | POA: Diagnosis present

## 2023-11-19 DIAGNOSIS — Z992 Dependence on renal dialysis: Secondary | ICD-10-CM

## 2023-11-19 DIAGNOSIS — I110 Hypertensive heart disease with heart failure: Secondary | ICD-10-CM | POA: Diagnosis present

## 2023-11-19 DIAGNOSIS — Z833 Family history of diabetes mellitus: Secondary | ICD-10-CM

## 2023-11-19 DIAGNOSIS — J432 Centrilobular emphysema: Secondary | ICD-10-CM | POA: Diagnosis present

## 2023-11-19 DIAGNOSIS — Z7960 Long term (current) use of unspecified immunomodulators and immunosuppressants: Secondary | ICD-10-CM

## 2023-11-19 DIAGNOSIS — Z7982 Long term (current) use of aspirin: Secondary | ICD-10-CM

## 2023-11-19 DIAGNOSIS — J189 Pneumonia, unspecified organism: Secondary | ICD-10-CM | POA: Diagnosis present

## 2023-11-19 DIAGNOSIS — R0902 Hypoxemia: Secondary | ICD-10-CM | POA: Diagnosis not present

## 2023-11-19 DIAGNOSIS — J9601 Acute respiratory failure with hypoxia: Secondary | ICD-10-CM | POA: Diagnosis present

## 2023-11-19 DIAGNOSIS — Z90710 Acquired absence of both cervix and uterus: Secondary | ICD-10-CM

## 2023-11-19 DIAGNOSIS — Z794 Long term (current) use of insulin: Secondary | ICD-10-CM

## 2023-11-19 DIAGNOSIS — R0602 Shortness of breath: Secondary | ICD-10-CM | POA: Diagnosis not present

## 2023-11-19 DIAGNOSIS — C3490 Malignant neoplasm of unspecified part of unspecified bronchus or lung: Secondary | ICD-10-CM | POA: Diagnosis present

## 2023-11-19 DIAGNOSIS — R0603 Acute respiratory distress: Secondary | ICD-10-CM | POA: Diagnosis not present

## 2023-11-19 DIAGNOSIS — I5042 Chronic combined systolic (congestive) and diastolic (congestive) heart failure: Secondary | ICD-10-CM | POA: Diagnosis present

## 2023-11-19 DIAGNOSIS — R062 Wheezing: Secondary | ICD-10-CM | POA: Diagnosis not present

## 2023-11-19 DIAGNOSIS — Z87891 Personal history of nicotine dependence: Secondary | ICD-10-CM

## 2023-11-19 DIAGNOSIS — R918 Other nonspecific abnormal finding of lung field: Secondary | ICD-10-CM | POA: Diagnosis not present

## 2023-11-19 DIAGNOSIS — Z85118 Personal history of other malignant neoplasm of bronchus and lung: Secondary | ICD-10-CM

## 2023-11-19 DIAGNOSIS — Z8249 Family history of ischemic heart disease and other diseases of the circulatory system: Secondary | ICD-10-CM

## 2023-11-19 LAB — MRSA NEXT GEN BY PCR, NASAL: MRSA by PCR Next Gen: NOT DETECTED

## 2023-11-19 LAB — ECHOCARDIOGRAM COMPLETE
AR max vel: 1.56 cm2
AV Area VTI: 1.99 cm2
AV Area mean vel: 1.48 cm2
AV Mean grad: 4 mmHg
AV Peak grad: 7.2 mmHg
Ao pk vel: 1.34 m/s
Area-P 1/2: 4.8 cm2
S' Lateral: 2.8 cm
Weight: 2560 [oz_av]

## 2023-11-19 LAB — RESP PANEL BY RT-PCR (RSV, FLU A&B, COVID)  RVPGX2
Influenza A by PCR: NEGATIVE
Influenza B by PCR: NEGATIVE
Resp Syncytial Virus by PCR: NEGATIVE
SARS Coronavirus 2 by RT PCR: NEGATIVE

## 2023-11-19 LAB — CBC
HCT: 30.7 % — ABNORMAL LOW (ref 36.0–46.0)
Hemoglobin: 9.8 g/dL — ABNORMAL LOW (ref 12.0–15.0)
MCH: 27 pg (ref 26.0–34.0)
MCHC: 31.9 g/dL (ref 30.0–36.0)
MCV: 84.6 fL (ref 80.0–100.0)
Platelets: 179 K/uL (ref 150–400)
RBC: 3.63 MIL/uL — ABNORMAL LOW (ref 3.87–5.11)
RDW: 14.6 % (ref 11.5–15.5)
WBC: 12.1 K/uL — ABNORMAL HIGH (ref 4.0–10.5)
nRBC: 0 % (ref 0.0–0.2)

## 2023-11-19 LAB — BASIC METABOLIC PANEL WITH GFR
Anion gap: 12 (ref 5–15)
BUN: 22 mg/dL (ref 8–23)
CO2: 29 mmol/L (ref 22–32)
Calcium: 9.1 mg/dL (ref 8.9–10.3)
Chloride: 90 mmol/L — ABNORMAL LOW (ref 98–111)
Creatinine, Ser: 0.79 mg/dL (ref 0.44–1.00)
GFR, Estimated: >60 mL/min (ref >60)
Glucose, Bld: 205 mg/dL — ABNORMAL HIGH (ref 70–99)
Potassium: 4 mmol/L (ref 3.5–5.1)
Sodium: 131 mmol/L — ABNORMAL LOW (ref 135–145)

## 2023-11-19 LAB — GLUCOSE, CAPILLARY
Glucose-Capillary: 168 mg/dL — ABNORMAL HIGH (ref 70–99)
Glucose-Capillary: 285 mg/dL — ABNORMAL HIGH (ref 70–99)

## 2023-11-19 LAB — BLOOD GAS, VENOUS
Acid-Base Excess: 7.3 mmol/L — ABNORMAL HIGH (ref 0.0–2.0)
Bicarbonate: 33.6 mmol/L — ABNORMAL HIGH (ref 20.0–28.0)
O2 Saturation: 70.1 %
Patient temperature: 37
pCO2, Ven: 53 mmHg (ref 44–60)
pH, Ven: 7.41 (ref 7.25–7.43)
pO2, Ven: 42 mmHg (ref 32–45)

## 2023-11-19 LAB — BRAIN NATRIURETIC PEPTIDE: B Natriuretic Peptide: 648.3 pg/mL — ABNORMAL HIGH (ref 0.0–100.0)

## 2023-11-19 MED ORDER — SODIUM CHLORIDE 0.9% FLUSH
3.0000 mL | Freq: Two times a day (BID) | INTRAVENOUS | Status: DC
  Administered 2023-11-19 – 2023-12-02 (×25): 3 mL via INTRAVENOUS

## 2023-11-19 MED ORDER — METHYLPREDNISOLONE SODIUM SUCC 125 MG IJ SOLR
125.0000 mg | Freq: Once | INTRAMUSCULAR | Status: AC
  Administered 2023-11-19: 125 mg via INTRAVENOUS
  Filled 2023-11-19: qty 2

## 2023-11-19 MED ORDER — INSULIN ASPART 100 UNIT/ML IJ SOLN
0.0000 [IU] | Freq: Three times a day (TID) | INTRAMUSCULAR | Status: DC
  Administered 2023-11-20: 15 [IU] via SUBCUTANEOUS
  Administered 2023-11-20 (×2): 11 [IU] via SUBCUTANEOUS
  Administered 2023-11-21: 8 [IU] via SUBCUTANEOUS
  Administered 2023-11-21: 15 [IU] via SUBCUTANEOUS
  Filled 2023-11-19: qty 0.15

## 2023-11-19 MED ORDER — ALBUTEROL SULFATE (2.5 MG/3ML) 0.083% IN NEBU
10.0000 mg/h | INHALATION_SOLUTION | RESPIRATORY_TRACT | Status: AC
  Administered 2023-11-19: 10 mg/h via RESPIRATORY_TRACT
  Filled 2023-11-19: qty 12

## 2023-11-19 MED ORDER — VANCOMYCIN HCL 1500 MG/300ML IV SOLN
1500.0000 mg | Freq: Once | INTRAVENOUS | Status: AC
  Administered 2023-11-19: 1500 mg via INTRAVENOUS
  Filled 2023-11-19: qty 300

## 2023-11-19 MED ORDER — GUAIFENESIN-DM 100-10 MG/5ML PO SYRP
5.0000 mL | ORAL_SOLUTION | ORAL | Status: DC | PRN
  Administered 2023-11-19 – 2023-11-21 (×3): 5 mL via ORAL
  Filled 2023-11-19 (×3): qty 5

## 2023-11-19 MED ORDER — ACETAMINOPHEN 650 MG RE SUPP: 650.0000 mg | Freq: Four times a day (QID) | RECTAL | Status: DC | PRN

## 2023-11-19 MED ORDER — METHYLPREDNISOLONE SODIUM SUCC 40 MG IJ SOLR
40.0000 mg | Freq: Every day | INTRAMUSCULAR | Status: DC
  Administered 2023-11-20 – 2023-11-21 (×2): 40 mg via INTRAVENOUS
  Filled 2023-11-19 (×2): qty 1

## 2023-11-19 MED ORDER — ASPIRIN 81 MG PO TBEC
81.0000 mg | DELAYED_RELEASE_TABLET | Freq: Every day | ORAL | Status: DC
  Administered 2023-11-20 – 2023-12-02 (×13): 81 mg via ORAL
  Filled 2023-11-19 (×13): qty 1

## 2023-11-19 MED ORDER — VANCOMYCIN HCL IN DEXTROSE 1-5 GM/200ML-% IV SOLN
1000.0000 mg | INTRAVENOUS | Status: DC
  Filled 2023-11-19: qty 200

## 2023-11-19 MED ORDER — SODIUM CHLORIDE 0.9 % IV SOLN
2.0000 g | Freq: Two times a day (BID) | INTRAVENOUS | Status: AC
  Administered 2023-11-19 – 2023-11-23 (×9): 2 g via INTRAVENOUS
  Filled 2023-11-19 (×10): qty 12.5

## 2023-11-19 MED ORDER — GLIMEPIRIDE 2 MG PO TABS
2.0000 mg | ORAL_TABLET | Freq: Two times a day (BID) | ORAL | Status: DC
  Administered 2023-11-19 – 2023-11-26 (×14): 2 mg via ORAL
  Filled 2023-11-19 (×2): qty 2
  Filled 2023-11-19: qty 1
  Filled 2023-11-19: qty 2
  Filled 2023-11-19: qty 1
  Filled 2023-11-19 (×2): qty 2
  Filled 2023-11-19 (×4): qty 1
  Filled 2023-11-19 (×2): qty 2
  Filled 2023-11-19 (×3): qty 1
  Filled 2023-11-19: qty 2
  Filled 2023-11-19 (×2): qty 1
  Filled 2023-11-19: qty 2
  Filled 2023-11-19 (×2): qty 1
  Filled 2023-11-19: qty 2
  Filled 2023-11-19 (×2): qty 1
  Filled 2023-11-19 (×2): qty 2

## 2023-11-19 MED ORDER — ATORVASTATIN CALCIUM 10 MG PO TABS
10.0000 mg | ORAL_TABLET | Freq: Every day | ORAL | Status: DC
  Administered 2023-11-19 – 2023-12-01 (×13): 10 mg via ORAL
  Filled 2023-11-19 (×13): qty 1

## 2023-11-19 MED ORDER — AMLODIPINE BESYLATE 5 MG PO TABS
10.0000 mg | ORAL_TABLET | Freq: Every day | ORAL | Status: DC
  Administered 2023-11-20 – 2023-12-02 (×13): 10 mg via ORAL
  Filled 2023-11-19 (×13): qty 2

## 2023-11-19 MED ORDER — ALBUTEROL SULFATE (2.5 MG/3ML) 0.083% IN NEBU
2.5000 mg | INHALATION_SOLUTION | RESPIRATORY_TRACT | Status: DC | PRN
  Administered 2023-11-27: 2.5 mg via RESPIRATORY_TRACT
  Filled 2023-11-19: qty 3

## 2023-11-19 MED ORDER — SIROLIMUS 0.5 MG PO TABS
2.0000 mg | ORAL_TABLET | Freq: Every day | ORAL | Status: DC
  Administered 2023-11-20 – 2023-12-02 (×13): 2 mg via ORAL
  Filled 2023-11-19 (×13): qty 4

## 2023-11-19 MED ORDER — ACETAMINOPHEN 325 MG PO TABS: 650.0000 mg | ORAL_TABLET | Freq: Four times a day (QID) | ORAL | Status: DC | PRN

## 2023-11-19 MED ORDER — ENOXAPARIN SODIUM 40 MG/0.4ML IJ SOSY
40.0000 mg | PREFILLED_SYRINGE | INTRAMUSCULAR | Status: DC
  Administered 2023-11-19 – 2023-12-01 (×13): 40 mg via SUBCUTANEOUS
  Filled 2023-11-19 (×13): qty 0.4

## 2023-11-19 MED ORDER — SODIUM CHLORIDE 0.9 % IV SOLN
1.0000 g | Freq: Once | INTRAVENOUS | Status: AC
  Administered 2023-11-19: 1 g via INTRAVENOUS
  Filled 2023-11-19: qty 10

## 2023-11-19 MED ORDER — INSULIN GLARGINE-YFGN 100 UNIT/ML ~~LOC~~ SOLN
22.0000 [IU] | Freq: Every day | SUBCUTANEOUS | Status: DC
  Administered 2023-11-20: 22 [IU] via SUBCUTANEOUS
  Filled 2023-11-19 (×2): qty 0.22

## 2023-11-19 MED ORDER — SODIUM CHLORIDE 0.9 % IV SOLN
500.0000 mg | Freq: Once | INTRAVENOUS | Status: AC
  Administered 2023-11-19: 500 mg via INTRAVENOUS
  Filled 2023-11-19: qty 5

## 2023-11-19 MED ORDER — MAGNESIUM SULFATE 2 GM/50ML IV SOLN
2.0000 g | Freq: Once | INTRAVENOUS | Status: AC
  Administered 2023-11-19: 2 g via INTRAVENOUS
  Filled 2023-11-19: qty 50

## 2023-11-19 MED ORDER — TACROLIMUS ER 1 MG PO TB24
1.0000 mg | ORAL_TABLET | Freq: Every day | ORAL | Status: DC
  Administered 2023-11-20 – 2023-12-02 (×13): 1 mg via ORAL
  Filled 2023-11-19 (×13): qty 1

## 2023-11-19 MED ORDER — IPRATROPIUM-ALBUTEROL 0.5-2.5 (3) MG/3ML IN SOLN
3.0000 mL | Freq: Four times a day (QID) | RESPIRATORY_TRACT | Status: DC
  Administered 2023-11-19 – 2023-11-20 (×3): 3 mL via RESPIRATORY_TRACT
  Filled 2023-11-19 (×4): qty 3

## 2023-11-19 MED ORDER — ONDANSETRON HCL 4 MG PO TABS
4.0000 mg | ORAL_TABLET | Freq: Four times a day (QID) | ORAL | Status: DC | PRN
Start: 2023-11-19 — End: 2023-12-02

## 2023-11-19 MED ORDER — INSULIN ASPART 100 UNIT/ML IJ SOLN
0.0000 [IU] | Freq: Every day | INTRAMUSCULAR | Status: DC
  Administered 2023-11-19: 3 [IU] via SUBCUTANEOUS
  Administered 2023-11-20: 4 [IU] via SUBCUTANEOUS
  Filled 2023-11-19: qty 0.05

## 2023-11-19 MED ORDER — ONDANSETRON HCL 4 MG/2ML IJ SOLN: 4.0000 mg | Freq: Four times a day (QID) | INTRAMUSCULAR | Status: DC | PRN

## 2023-11-19 MED ORDER — LATANOPROST 0.005 % OP SOLN
1.0000 [drp] | Freq: Every day | OPHTHALMIC | Status: DC
  Administered 2023-11-20 – 2023-12-01 (×13): 1 [drp] via OPHTHALMIC
  Filled 2023-11-19: qty 2.5

## 2023-11-19 MED ORDER — FUROSEMIDE 10 MG/ML IJ SOLN
40.0000 mg | Freq: Once | INTRAMUSCULAR | Status: AC
  Administered 2023-11-19: 40 mg via INTRAVENOUS
  Filled 2023-11-19: qty 4

## 2023-11-19 NOTE — Plan of Care (Signed)
 Problem: Education: Goal: Ability to describe self-care measures that may prevent or decrease complications (Diabetes Survival Skills Education) will improve 11/19/2023 1910 by Joesph Morna SQUIBB, RN Outcome: Progressing 11/19/2023 1910 by Joesph Morna SQUIBB, RN Outcome: Progressing Goal: Individualized Educational Video(s) 11/19/2023 1910 by Joesph Morna SQUIBB, RN Outcome: Progressing 11/19/2023 1910 by Joesph Morna SQUIBB, RN Outcome: Progressing   Problem: Coping: Goal: Ability to adjust to condition or change in health will improve 11/19/2023 1910 by Joesph Morna SQUIBB, RN Outcome: Progressing 11/19/2023 1910 by Joesph Morna SQUIBB, RN Outcome: Progressing   Problem: Fluid Volume: Goal: Ability to maintain a balanced intake and output will improve 11/19/2023 1910 by Joesph Morna SQUIBB, RN Outcome: Progressing 11/19/2023 1910 by Joesph Morna SQUIBB, RN Outcome: Progressing   Problem: Health Behavior/Discharge Planning: Goal: Ability to identify and utilize available resources and services will improve 11/19/2023 1910 by Joesph Morna SQUIBB, RN Outcome: Progressing 11/19/2023 1910 by Joesph Morna SQUIBB, RN Outcome: Progressing Goal: Ability to manage health-related needs will improve 11/19/2023 1910 by Joesph Morna SQUIBB, RN Outcome: Progressing 11/19/2023 1910 by Joesph Morna SQUIBB, RN Outcome: Progressing   Problem: Metabolic: Goal: Ability to maintain appropriate glucose levels will improve 11/19/2023 1910 by Joesph Morna SQUIBB, RN Outcome: Progressing 11/19/2023 1910 by Joesph Morna SQUIBB, RN Outcome: Progressing   Problem: Nutritional: Goal: Maintenance of adequate nutrition will improve 11/19/2023 1910 by Joesph Morna SQUIBB, RN Outcome: Progressing 11/19/2023 1910 by Joesph Morna SQUIBB, RN Outcome: Progressing Goal: Progress toward achieving an optimal weight will improve 11/19/2023 1910 by Joesph Morna SQUIBB, RN Outcome: Progressing 11/19/2023 1910 by Joesph Morna SQUIBB, RN Outcome: Progressing   Problem:  Skin Integrity: Goal: Risk for impaired skin integrity will decrease 11/19/2023 1910 by Joesph Morna SQUIBB, RN Outcome: Progressing 11/19/2023 1910 by Joesph Morna SQUIBB, RN Outcome: Progressing   Problem: Tissue Perfusion: Goal: Adequacy of tissue perfusion will improve 11/19/2023 1910 by Joesph Morna SQUIBB, RN Outcome: Progressing 11/19/2023 1910 by Joesph Morna SQUIBB, RN Outcome: Progressing   Problem: Education: Goal: Knowledge of General Education information will improve Description: Including pain rating scale, medication(s)/side effects and non-pharmacologic comfort measures 11/19/2023 1910 by Joesph Morna SQUIBB, RN Outcome: Progressing 11/19/2023 1910 by Joesph Morna SQUIBB, RN Outcome: Progressing   Problem: Health Behavior/Discharge Planning: Goal: Ability to manage health-related needs will improve 11/19/2023 1910 by Joesph Morna SQUIBB, RN Outcome: Progressing 11/19/2023 1910 by Joesph Morna SQUIBB, RN Outcome: Progressing   Problem: Clinical Measurements: Goal: Ability to maintain clinical measurements within normal limits will improve 11/19/2023 1910 by Joesph Morna SQUIBB, RN Outcome: Progressing 11/19/2023 1910 by Joesph Morna SQUIBB, RN Outcome: Progressing Goal: Will remain free from infection 11/19/2023 1910 by Joesph Morna SQUIBB, RN Outcome: Progressing 11/19/2023 1910 by Joesph Morna SQUIBB, RN Outcome: Progressing Goal: Diagnostic test results will improve 11/19/2023 1910 by Joesph Morna SQUIBB, RN Outcome: Progressing 11/19/2023 1910 by Joesph Morna SQUIBB, RN Outcome: Progressing Goal: Respiratory complications will improve 11/19/2023 1910 by Joesph Morna SQUIBB, RN Outcome: Progressing 11/19/2023 1910 by Joesph Morna SQUIBB, RN Outcome: Progressing Goal: Cardiovascular complication will be avoided 11/19/2023 1910 by Joesph Morna SQUIBB, RN Outcome: Progressing 11/19/2023 1910 by Joesph Morna SQUIBB, RN Outcome: Progressing   Problem: Activity: Goal: Risk for activity intolerance will  decrease 11/19/2023 1910 by Joesph Morna SQUIBB, RN Outcome: Progressing 11/19/2023 1910 by Joesph Morna SQUIBB, RN Outcome: Progressing   Problem: Nutrition: Goal: Adequate nutrition will be maintained 11/19/2023 1910 by Joesph Morna SQUIBB, RN Outcome: Progressing 11/19/2023 1910 by Joesph Morna SQUIBB, RN Outcome: Progressing  Problem: Coping: Goal: Level of anxiety will decrease 11/19/2023 1910 by Joesph Morna SQUIBB, RN Outcome: Progressing 11/19/2023 1910 by Joesph Morna SQUIBB, RN Outcome: Progressing   Problem: Elimination: Goal: Will not experience complications related to bowel motility 11/19/2023 1910 by Joesph Morna SQUIBB, RN Outcome: Progressing 11/19/2023 1910 by Joesph Morna SQUIBB, RN Outcome: Progressing Goal: Will not experience complications related to urinary retention 11/19/2023 1910 by Joesph Morna SQUIBB, RN Outcome: Progressing 11/19/2023 1910 by Joesph Morna SQUIBB, RN Outcome: Progressing   Problem: Pain Managment: Goal: General experience of comfort will improve and/or be controlled 11/19/2023 1910 by Joesph Morna SQUIBB, RN Outcome: Progressing 11/19/2023 1910 by Joesph Morna SQUIBB, RN Outcome: Progressing   Problem: Safety: Goal: Ability to remain free from injury will improve 11/19/2023 1910 by Joesph Morna SQUIBB, RN Outcome: Progressing 11/19/2023 1910 by Joesph Morna SQUIBB, RN Outcome: Progressing   Problem: Skin Integrity: Goal: Risk for impaired skin integrity will decrease 11/19/2023 1910 by Joesph Morna SQUIBB, RN Outcome: Progressing 11/19/2023 1910 by Joesph Morna SQUIBB, RN Outcome: Progressing

## 2023-11-19 NOTE — ED Triage Notes (Signed)
 Pr BIBA from dr office, room air sat was 60%. Been having increasing shob for 2 days, using home nebs and inhaler without relief. Denies fever. Hx copd, left lung cx, chf. Normal sats around 93%. 2 duoneb en route. Very diminished sounds with wheezing. O2 100% on treatment

## 2023-11-19 NOTE — ED Provider Notes (Signed)
 [place] EMERGENCY DEPARTMENT AT [facility] Provider Note   CSN: [id number] Arrival date & time: 11/19/23  9095     Patient presents with: Respiratory Distress   Pamela [name] is a 73 y.o. female with a history of COPD, lung cancer, not on home O2, former smoker (quit 30 years ago), here with SOB for about 1 week.  Went to PCP who called EMS due to O2 saturation 60% in office.  EMS gave 2 rounds duonebs, no other medications.  Pt feeling better after breathing treatments.     HPI     Prior to Admission medications   Medication Sig Start Date End Date Taking? Authorizing Provider  Accu-Chek Softclix Lancets lancets DX: E11.21 as directed 12/06/16   [provider]  albuterol  (VENTOLIN  HFA) 108 (90 Base) MCG/ACT inhaler Inhale 2 puffs into the lungs See admin instructions. Inhale 2 puffs into the lungs in the morning and at bedtime- may use an additional 2 puffs twice a day as needed for shortness of breath or wheezing    [provider]  amLODipine  (NORVASC ) 10 MG tablet Take 10 mg by mouth daily. 03/04/17   [provider]  aspirin  EC 81 MG tablet Take 81 mg by mouth daily.    [provider]  atorvastatin  (LIPITOR) 10 MG tablet Take 1 tablet (10 mg total) by mouth daily. Patient taking differently: Take 10 mg by mouth at bedtime. 06/08/20   Claudene Victory ORN, MD  Blood Glucose Calibration (ACCU-CHEK AVIVA) SOLN DX: E11.21 as directed 12/06/16   [provider]  carvedilol  (COREG ) 12.5 MG tablet Take 1 tablet (12.5 mg total) by mouth 2 (two) times daily. Patient not taking: Reported on 09/29/2023 06/08/20   Claudene Victory ORN, MD  glimepiride  (AMARYL ) 2 MG tablet Take 2 mg by mouth daily.    [provider]  glucose blood (ACCU-CHEK AVIVA PLUS) test strip use as directed to check  blood sugar 4 times daily    [provider]  insulin  lispro (HUMALOG) 100 UNIT/ML KiwkPen Inject 8-13 Units into the skin See admin  instructions. Inject 8-13 units into the skin three times a day with meals, PER SLIDING SCALE (when able to eat and tolerate) 01/22/18   [provider]  LANTUS  SOLOSTAR 100 UNIT/ML Solostar Pen Inject 22 Units into the skin in the morning.    [provider]  latanoprost  (XALATAN ) 0.005 % ophthalmic solution Place 1 drop into both eyes at bedtime.    [provider]  lidocaine  (XYLOCAINE ) 2 % solution Patient: Mix 1part 2% viscous lidocaine , 1part H20. Swallow 10mL of diluted mixture, before meals and at bedtime, up to QID 10/13/23   Izell Domino, MD  nitroGLYCERIN  (NITROSTAT ) 0.4 MG SL tablet Place 0.4 mg under the tongue every 5 (five) minutes as needed for chest pain.    [provider]  ondansetron  (ZOFRAN ) 8 MG tablet Take 1 tablet (8 mg total) by mouth every 8 (eight) hours as needed for nausea or vomiting. Start on the third day after chemotherapy. 09/09/23   Sherrod Sherrod, MD  oxyCODONE -acetaminophen  (PERCOCET/ROXICET) 5-325 MG tablet Take 1 tablet by mouth every 6 (six) hours as needed for severe pain (pain score 7-10). 09/22/23   Shannon Agent, MD  predniSONE  (DELTASONE ) 5 MG tablet Take 5 mg by mouth daily with breakfast.  12/22/17   [provider]  prochlorperazine  (COMPAZINE ) 10 MG tablet Take 1 tablet (10 mg total) by mouth every 6 (six) hours as  needed for nausea or vomiting. 09/09/23   Sherrod Sherrod, MD  sirolimus  (RAPAMUNE ) 1 MG tablet Take 2 mg by mouth daily. 09/16/23   [provider]  tacrolimus  ER (ENVARSUS  XR) 1 MG TB24 Take 1 mg by mouth daily. 08/10/20   [provider]  umeclidinium-vilanterol (ANORO ELLIPTA) 62.5-25 MCG/INH AEPB Inhale 1 puff into the lungs daily.  01/07/18   [provider]    Allergies: Patient has no known allergies.    Review of Systems  Updated Vital Signs BP 127/63   Pulse 84   Temp 98.7 F (37.1 C) (Oral)   Resp (!) 26   Wt 72.6 kg   SpO2 98%   BMI 29.74 kg/m    Physical Exam Constitutional:      General: She is not in acute distress. HENT:     Head: Normocephalic and atraumatic.  Eyes:     Conjunctiva/sclera: Conjunctivae normal.     Pupils: Pupils are equal, round, and reactive to light.  Cardiovascular:     Rate and Rhythm: Normal rate and regular rhythm.  Pulmonary:     Effort: Pulmonary effort is normal. No respiratory distress.     Comments: Tachypnea, RR 36 Poor air movement bilaterally, expiratory wheezing, pt able to speak in full sentence, on 6L [place] 100% Abdominal:     General: There is no distension.     Tenderness: There is no abdominal tenderness.  Skin:    General: Skin is warm and dry.  Neurological:     General: No focal deficit present.     Mental Status: She is alert. Mental status is at baseline.  Psychiatric:        Mood and Affect: Mood normal.        Behavior: Behavior normal.     (all labs ordered are listed, but only abnormal results are displayed) Labs Reviewed  BLOOD GAS, VENOUS - Abnormal; Notable for the following components:      Result Value   Bicarbonate 33.6 (*)    Acid-Base Excess 7.3 (*)    All other components within normal limits  BASIC METABOLIC PANEL WITH GFR - Abnormal; Notable for the following components:   Sodium 131 (*)    Chloride 90 (*)    Glucose, Bld 205 (*)    All other components within normal limits  CBC - Abnormal; Notable for the following components:   WBC 12.1 (*)    RBC 3.63 (*)    Hemoglobin 9.8 (*)    HCT 30.7 (*)    All other components within normal limits  BRAIN NATRIURETIC PEPTIDE - Abnormal; Notable for the following components:   B Natriuretic Peptide 648.3 (*)    All other components within normal limits  RESP PANEL BY RT-PCR (RSV, FLU A&B, COVID)  RVPGX2  MRSA NEXT GEN BY PCR, NASAL    EKG: EKG Interpretation Date/Time:  Wednesday November 19 2023 09:15:52 EDT Ventricular Rate:  87 PR Interval:  120 QRS Duration:  96 QT Interval:  365 QTC  Calculation: 440 R Axis:   92  Text Interpretation: Sinus rhythm Right axis deviation Confirmed by Cottie Cough [phone]) on 11/19/2023 10:00:30 AM  Radiology: [facility] Chest Portable 1 View Result Date: 11/19/2023 CLINICAL DATA:  Shortness of breath. EXAM: PORTABLE CHEST 1 VIEW COMPARISON:  August 18, 2023.  November 17, 2023. FINDINGS: Normal cardiac size. Right lung is clear. Left perihilar density is noted which corresponds to cavitary mass noted on prior CT scan. Left lower lobe airspace opacity  is noted most consistent with pneumonia or atelectasis. The visualized skeletal structures are unremarkable. IMPRESSION: Left perihilar density is noted which corresponds to cavitary mass seen on recent CT scan. Large left lower lobe airspace opacity is noted most consistent with postobstructive pneumonia or atelectasis. Electronically Signed   By: Lynwood Landy Raddle M.D.   On: 11/19/2023 10:15     .Critical Care  Performed by: Cottie Donnice PARAS, MD Authorized by: Cottie Donnice PARAS, MD   Critical care provider statement:    Critical care time (minutes):  45   Critical care time was exclusive of:  Separately billable procedures and treating other patients   Critical care was necessary to treat or prevent imminent or life-threatening deterioration of the following conditions:  Respiratory failure   Critical care was time spent personally by me on the following activities:  Ordering and performing treatments and interventions, ordering and review of laboratory studies, ordering and review of radiographic studies, pulse oximetry, review of old charts, examination of patient and evaluation of patient's response to treatment    Medications Ordered in the ED  albuterol  (PROVENTIL ) (2.5 MG/3ML) 0.083% nebulizer solution (0 mg/hr Nebulization Stopped 11/19/23 1154)  azithromycin  (ZITHROMAX ) 500 mg in sodium chloride  0.9 % 250 mL IVPB (500 mg Intravenous New Bag/Given 11/19/23 1251)  furosemide  (LASIX ) injection 40 mg (has no  administration in time range)  enoxaparin  (LOVENOX ) injection 40 mg (has no administration in time range)  sodium chloride  flush (NS) 0.9 % injection 3 mL (3 mLs Intravenous Not Given 11/19/23 1252)  acetaminophen  (TYLENOL ) tablet 650 mg (has no administration in time range)    Or  acetaminophen  (TYLENOL ) suppository 650 mg (has no administration in time range)  ondansetron  (ZOFRAN ) tablet 4 mg (has no administration in time range)    Or  ondansetron  (ZOFRAN ) injection 4 mg (has no administration in time range)  ipratropium-albuterol  (DUONEB) 0.5-2.5 (3) MG/3ML nebulizer solution 3 mL (has no administration in time range)  ceFEPIme  (MAXIPIME ) 2 g in sodium chloride  0.9 % 100 mL IVPB (has no administration in time range)  vancomycin  (VANCOREADY) IVPB 1500 mg/300 mL (has no administration in time range)  vancomycin  (VANCOCIN ) IVPB 1000 mg/200 mL premix (has no administration in time range)  magnesium  sulfate IVPB 2 g 50 mL (0 g Intravenous Stopped 11/19/23 1155)  methylPREDNISolone  sodium succinate  (SOLU-MEDROL ) 125 mg/2 mL injection 125 mg (125 mg Intravenous Given 11/19/23 1052)  cefTRIAXone  (ROCEPHIN ) 1 g in sodium chloride  0.9 % 100 mL IVPB (0 g Intravenous Stopped 11/19/23 1224)    Clinical Course as of 11/19/23 1255  Wed Nov 19, 2023  0927 Discussed bipap for tachypnea and WOB but pt refused this.  Given that her symptoms have been ongoing for 1 week, we can continue with current medical management at this time while awaiting blood gas and labs.  No indication for intubation at this time. [MT]    Clinical Course User Index [MT] Aisea Bouldin, Donnice PARAS, MD                                 Medical Decision Making Amount and/or Complexity of Data Reviewed Labs: ordered. Radiology: ordered.  Risk Prescription drug management. Decision regarding hospitalization.   This patient presents to the ED with concern for SOB. This involves an extensive number of treatment options, and is a complaint that  carries with it a high risk of complications and morbidity.  The differential diagnosis includes  COPD vs pleural effusion vs PNA vs CHF new onset vs PE vs other  Co-morbidities that complicate the patient evaluation: hx of COPD, lung cancer  Additional history obtained from EMS  External records from outside source obtained and reviewed including [facility] physician records from today  I ordered and personally interpreted labs.  The pertinent results include:  mild WBC elevation; covid /flu negative  I ordered imaging studies including dg chest I independently visualized and interpreted imaging which showed possible post obstructive PNA I agree with the radiologist interpretation  The patient was maintained on a cardiac monitor.  I personally viewed and interpreted the cardiac monitored which showed an underlying rhythm of: sinus rhythm  Per my interpretation the patient's ECG shows sinus rhythm with no acute ischemic findings  I ordered medication including nebulizer albuterol , IV magnesium  and solumedrol for COPD exacerbation; IV antibiotics for CAP  I have reviewed the patients home medicines and have made adjustments as needed  Test Considered: doubt acute PE in this clinical setting  After the interventions noted above, I reevaluated the patient and found that they have: improved   Dispostion:  After consideration of the diagnostic results and the patients response to treatment, I feel that the patent would benefit from medical admission      Final diagnoses:  COPD exacerbation (HCC)  Pneumonia due to infectious organism, unspecified laterality, unspecified part of lung    ED Discharge Orders     None          Johnasia Liese, Donnice PARAS, MD 11/19/23 1255

## 2023-11-19 NOTE — ED Notes (Signed)
 ED TO INPATIENT HANDOFF REPORT  ED Nurse Name and Phone #: Joaquim [phone]  S Name/Age/Gender Pamela [name] 73 y.o. female Room/Bed: WA12/WA12  Code Status   Code Status: Full Code  Home/SNF/Other Home Patient oriented to: self, place, time, and situation Is this baseline? Yes   Triage Complete: Triage complete  Chief Complaint Acute respiratory failure with hypoxia (HCC) [J96.01]  Triage Note Pr BIBA from dr office, room air sat was 60%. Been having increasing shob for 2 days, using home nebs and inhaler without relief. Denies fever. Hx copd, left lung cx, chf. Normal sats around 93%. 2 duoneb en route. Very diminished sounds with wheezing. O2 100% on treatment   Allergies No Known Allergies  Level of Care/Admitting Diagnosis ED Disposition     ED Disposition  Admit   Condition  --   Comment  Hospital Area: [facility] [[id number]]  Level of Care: Telemetry [5]  Admit to tele based on following criteria: Acute CHF  May place patient in observation at [facility] or [facility] if equivalent level of care is available:: Yes  Covid Evaluation: Symptomatic Person Under Investigation (PUI) or recent exposure (last 10 days) *Testing Required*  Diagnosis: Acute respiratory failure with hypoxia [facility]) [[id number]]  Admitting Physician: CINDY GARNETTE POUR [[id number]]  Attending Physician: CINDY GARNETTE POUR [[id number]]  For patients discharging to extended facilities (i.e. SNF, AL, group homes or LTAC) initiate:: Discharge to SNF/Facility Placement COVID-19 Lab Testing Protocol          B Medical/Surgery History Past Medical History:  Diagnosis Date   Anemia    Anxiety    CHF (congestive heart failure) (HCC)    Chronic kidney disease    COPD (chronic obstructive pulmonary disease) (HCC)    Coronary artery disease    Diabetes mellitus without complication (HCC) 10/29/2012   ESRD on dialysis (HCC) 01/25/2015   Headache    Hypertension    PONV (postoperative nausea  and vomiting)    Shortness of breath dyspnea    Past Surgical History:  Procedure Laterality Date   ABDOMINAL HYSTERECTOMY     ANAL FISSURE REPAIR     APPENDECTOMY     '74- open with gallbladder   AV FISTULA PLACEMENT Left 01/31/2015   Procedure: ARTERIOVENOUS FISTULA CREATION-LEFT BRACHIO-CEPHALIC ;  Surgeon: Redell LITTIE Door, MD;  Location: [facility] OR;  Service: Vascular;  Laterality: Left;   AV FISTULA PLACEMENT Left 04/04/2015   Procedure: INSERTION OF ARTERIOVENOUS (AV) GORE-TEX GRAFT ARM;  Surgeon: Carlin FORBES Haddock, MD;  Location: [facility] OR;  Service: Vascular;  Laterality: Left;   BILIARY BRUSHING  10/01/2023   Procedure: BRUSH BIOPSY, BILE DUCT;  Surgeon: Saintclair Jasper, MD;  Location: [facility] ENDOSCOPY;  Service: Gastroenterology;;   BREAST EXCISIONAL BIOPSY Left    BREAST SURGERY     cyst removed   BRONCHIAL BIOPSY  08/18/2023   Procedure: BRONCHOSCOPY, WITH BIOPSY;  Surgeon: Shelah Lamar RAMAN, MD;  Location: [facility] ENDOSCOPY;  Service: Pulmonary;;   BRONCHIAL BRUSHINGS  08/18/2023   Procedure: BRONCHOSCOPY, WITH BRUSH BIOPSY;  Surgeon: Shelah Lamar RAMAN, MD;  Location: [facility] ENDOSCOPY;  Service: Pulmonary;;   CARDIAC CATHETERIZATION     1 coronary stent placed   CHOLECYSTECTOMY     '74-open   COLONOSCOPY WITH PROPOFOL  N/A 11/17/2012   Procedure: COLONOSCOPY WITH PROPOFOL ;  Surgeon: Gladis POUR Louder, MD;  Location: [facility] ENDOSCOPY;  Service: Endoscopy;  Laterality: N/A;   CORONARY STENT PLACEMENT     ELBOW SURGERY Right  tendon surgery   ERCP N/A 10/01/2023   Procedure: ERCP, WITH INTERVENTION IF INDICATED;  Surgeon: Saintclair Jasper, MD;  Location: [facility] ENDOSCOPY;  Service: Gastroenterology;  Laterality: N/A;   ESOPHAGOGASTRODUODENOSCOPY (EGD) WITH PROPOFOL  N/A 11/17/2012   Procedure: ESOPHAGOGASTRODUODENOSCOPY (EGD) WITH PROPOFOL ;  Surgeon: Gladis MARLA Louder, MD;  Location: [facility] ENDOSCOPY;  Service: Endoscopy;  Laterality: N/A;   GANGLION CYST EXCISION Bilateral 10/29/2012   wrist   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    LEFT HEART CATHETERIZATION WITH CORONARY ANGIOGRAM N/A 04/14/2014   Procedure: LEFT HEART CATHETERIZATION WITH CORONARY ANGIOGRAM;  Surgeon: Victory LELON Claudene DOUGLAS, MD;  Location[facility] C[facility];  Service: Cardiovascular;  Laterality: N/A;   PERIPHERAL VASCULAR CATHETERIZATION N/A 03/09/2015   Procedure: Fistulagram;  Surgeon: Redell LITTIE Door, MD;  Location: [facility] INVASIVE CV LAB;  Service: Cardiovascular;  Laterality: N/A;   STONE EXTRACTION WITH BASKET  10/01/2023   Procedure: ERCP, WITH LITHROTRIPSY OR REMOVAL OF COMMON BILE DUCT CALCULUS USING BASKET;  Surgeon: Saintclair Jasper, MD;  Location: [facility] ENDOSCOPY;  Service: Gastroenterology;;   TEE WITHOUT CARDIOVERSION N/A 01/20/2015   Procedure: TRANSESOPHAGEAL ECHOCARDIOGRAM (TEE);  Surgeon: Vina Okey GAILS, MD;  Location: [facility] ENDOSCOPY;  Service: Cardiovascular;  Laterality: N/A;   TUBAL LIGATION     VIDEO BRONCHOSCOPY  08/18/2023   Procedure: BRONCHOSCOPY, WITH FLUOROSCOPY;  Surgeon: Shelah Lamar RAMAN, MD;  Location: [facility] ENDOSCOPY;  Service: Pulmonary;;     A IV Location/Drains/Wounds Patient Lines/Drains/Airways Status     Active Line/Drains/Airways     Name Placement date Placement time Site Days   Peripheral IV 11/19/23 20 G 1.88 Anterior;Proximal;Right Forearm 11/19/23  1049  Forearm  less than 1   Fistula / Graft Left Upper arm Arteriovenous fistula 01/31/15  1023  Upper arm  3214   Fistula / Graft Left Upper arm Arteriovenous vein graft 04/04/15  1329  Upper arm  3151   Hemodialysis Catheter Right Internal jugular Double-lumen;Permanent 01/25/15  1650  Internal jugular  3220            Intake/Output Last 24 hours  Intake/Output Summary (Last 24 hours) at 11/19/2023 1618 Last data filed at 11/19/2023 1351 Gross per 24 hour  Intake 400 ml  Output --  Net 400 ml    Labs/Imaging Results for orders placed or performed during the hospital encounter of 11/19/23 (from the past 48 hours)  Resp panel by RT-PCR (RSV, Flu A&B, Covid) Anterior Nasal Swab      Status: None   Collection Time: 11/19/23 10:51 AM   Specimen: Anterior Nasal Swab  Result Value Ref Range   SARS Coronavirus 2 by RT PCR NEGATIVE NEGATIVE    Comment: (NOTE) SARS-CoV-2 target nucleic acids are NOT DETECTED.  The SARS-CoV-2 RNA is generally detectable in upper respiratory specimens during the acute phase of infection. The lowest concentration of SARS-CoV-2 viral copies this assay can detect is 138 copies/mL. A negative result does not preclude SARS-Cov-2 infection and should not be used as the sole basis for treatment or other patient management decisions. A negative result may occur with  improper specimen collection/handling, submission of specimen other than nasopharyngeal swab, presence of viral mutation(s) within the areas targeted by this assay, and inadequate number of viral copies(<138 copies/mL). A negative result must be combined with clinical observations, patient history, and epidemiological information. The expected result is Negative.  Fact Sheet for Patients:  BloggerCourse.com  Fact Sheet for Healthcare Providers:  SeriousBroker.it  This test is no t yet approved or cleared by the [place]  [place] FDA and  has been authorized for detection and/or diagnosis of SARS-CoV-2 by FDA under an Emergency Use Authorization (EUA). This EUA will remain  in effect (meaning this test can be used) for the duration of the COVID-19 declaration under Section 564(b)(1) of the Act, 21 U.S.C.section 360bbb-3(b)(1), unless the authorization is terminated  or revoked sooner.       Influenza A by PCR NEGATIVE NEGATIVE   Influenza B by PCR NEGATIVE NEGATIVE    Comment: (NOTE) The Xpert Xpress SARS-CoV-2/FLU/RSV plus assay is intended as an aid in the diagnosis of influenza from Nasopharyngeal swab specimens and should not be used as a sole basis for treatment. Nasal washings and aspirates are unacceptable for Xpert Xpress  SARS-CoV-2/FLU/RSV testing.  Fact Sheet for Patients: BloggerCourse.com  Fact Sheet for Healthcare Providers: SeriousBroker.it  This test is not yet approved or cleared by the [place]  FDA and has been authorized for detection and/or diagnosis of SARS-CoV-2 by FDA under an Emergency Use Authorization (EUA). This EUA will remain in effect (meaning this test can be used) for the duration of the COVID-19 declaration under Section 564(b)(1) of the Act, 21 U.S.C. section 360bbb-3(b)(1), unless the authorization is terminated or revoked.     Resp Syncytial Virus by PCR NEGATIVE NEGATIVE    Comment: (NOTE) Fact Sheet for Patients: BloggerCourse.com  Fact Sheet for Healthcare Providers: SeriousBroker.it  This test is not yet approved or cleared by the [place]  FDA and has been authorized for detection and/or diagnosis of SARS-CoV-2 by FDA under an Emergency Use Authorization (EUA). This EUA will remain in effect (meaning this test can be used) for the duration of the COVID-19 declaration under Section 564(b)(1) of the Act, 21 U.S.C. section 360bbb-3(b)(1), unless the authorization is terminated or revoked.  Performed at [facility], [address], [place], [place] [address]   Blood gas, venous (at [facility])     Status: Abnormal   Collection Time: 11/19/23 10:59 AM  Result Value Ref Range   pH, Ven 7.41 7.25 - 7.43   pCO2, Ven 53 44 - 60 mmHg   pO2, Ven 42 32 - 45 mmHg   Bicarbonate 33.6 (H) 20.0 - 28.0 mmol/L   Acid-Base Excess 7.3 (H) 0.0 - 2.0 mmol/L   O2 Saturation 70.1 %   Patient temperature 37.0     Comment: Performed at [facility], [address] [place], [place] [address]  Basic metabolic panel     Status: Abnormal   Collection Time: 11/19/23 10:59 AM  Result Value Ref Range   Sodium 131 (L) 135 - 145 mmol/L    Potassium 4.0 3.5 - 5.1 mmol/L   Chloride 90 (L) 98 - 111 mmol/L   CO2 29 22 - 32 mmol/L   Glucose, Bld 205 (H) 70 - 99 mg/dL    Comment: Glucose reference range applies only to samples taken after fasting for at least 8 hours.   BUN 22 8 - 23 mg/dL   Creatinine, Ser 9.20 0.44 - 1.00 mg/dL   Calcium  9.1 8.9 - 10.3 mg/dL   GFR, Estimated >39 >39 mL/min    Comment: (NOTE) Calculated using the CKD-EPI Creatinine Equation (2021)    Anion gap 12 5 - 15    Comment: Performed at [facility], [address], [place], [place] [address]  CBC     Status: Abnormal   Collection Time: 11/19/23 10:59 AM  Result Value Ref Range   WBC 12.1 (H)  4.0 - 10.5 K/uL   RBC 3.63 (L) 3.87 - 5.11 MIL/uL   Hemoglobin 9.8 (L) 12.0 - 15.0 g/dL   HCT 69.2 (L) 63.9 - 53.9 %   MCV 84.6 80.0 - 100.0 fL   MCH 27.0 26.0 - 34.0 pg   MCHC 31.9 30.0 - 36.0 g/dL   RDW 85.3 88.4 - 84.4 %   Platelets 179 150 - 400 K/uL   nRBC 0.0 0.0 - 0.2 %    Comment: Performed at [facility], [address] [place], [place] [address]  Brain natriuretic peptide     Status: Abnormal   Collection Time: 11/19/23 10:59 AM  Result Value Ref Range   B Natriuretic Peptide 648.3 (H) 0.0 - 100.0 pg/mL    Comment: Performed at [facility], [address], [place], [place] [address]  MRSA Next Gen by PCR, Nasal     Status: None   Collection Time: 11/19/23  2:11 PM   Specimen: Nasal Mucosa; Nasal Swab  Result Value Ref Range   MRSA by PCR Next Gen NOT DETECTED NOT DETECTED    Comment: (NOTE) The GeneXpert MRSA Assay (FDA approved for NASAL specimens only), is one component of a comprehensive MRSA colonization surveillance program. It is not intended to diagnose MRSA infection nor to guide or monitor treatment for MRSA infections. Test performance is not FDA approved in patients less than 34 years old. Performed at [facility], [address] [place],  [place] [address]    ECHOCARDIOGRAM COMPLETE Result Date: 11/19/2023    ECHOCARDIOGRAM REPORT   Patient Name:   Pamela [name] Date of Exam: 11/19/2023 Medical Rec #:  [id number]        Height:       61.5 in Accession #:    [id number]       Weight:       160.0 lb Date of Birth:  10-01-1950       BSA:          1.728 m Patient Age:    72 years         BP:           127/63 mmHg Patient Gender: F                HR:           85 bpm. Exam Location:  Inpatient Procedure: 2D Echo, Cardiac Doppler and Color Doppler (Both Spectral and Color            Flow Doppler were utilized during procedure). Indications:    CHF  History:        Patient has prior history of Echocardiogram examinations, most                 recent 01/26/2019. Acute Respiratory Failure.  Sonographer:    Benard Stallion Referring Phys: [id number] ALLISON L ELLIS IMPRESSIONS  1. Left ventricular ejection fraction, by estimation, is 55 to 60%. The left ventricle has normal function. The left ventricle has no regional wall motion abnormalities. Left ventricular diastolic parameters are consistent with Grade I diastolic dysfunction (impaired relaxation). Elevated left atrial pressure.  2. Right ventricular systolic function is normal. The right ventricular size is normal. Tricuspid regurgitation signal is inadequate for assessing PA pressure.  3. The mitral valve is normal in structure. Trivial mitral valve regurgitation. No evidence of mitral stenosis.  4. The aortic valve is tricuspid. Aortic valve regurgitation is not visualized. No aortic stenosis  is present.  5. The inferior vena cava is normal in size with greater than 50% respiratory variability, suggesting right atrial pressure of 3 mmHg. FINDINGS  Left Ventricle: Left ventricular ejection fraction, by estimation, is 55 to 60%. The left ventricle has normal function. The left ventricle has no regional wall motion abnormalities. The left ventricular internal cavity size was normal in size. There is  no left  ventricular hypertrophy. Left ventricular diastolic parameters are consistent with Grade I diastolic dysfunction (impaired relaxation). Elevated left atrial pressure. Right Ventricle: The right ventricular size is normal. Right ventricular systolic function is normal. Tricuspid regurgitation signal is inadequate for assessing PA pressure. The tricuspid regurgitant velocity is 2.54 m/s, and with an assumed right atrial  pressure of 3 mmHg, the estimated right ventricular systolic pressure is 28.8 mmHg. Left Atrium: Left atrial size was normal in size. Right Atrium: Right atrial size was normal in size. Pericardium: There is no evidence of pericardial effusion. Mitral Valve: The mitral valve is normal in structure. Trivial mitral valve regurgitation. No evidence of mitral valve stenosis. Tricuspid Valve: The tricuspid valve is normal in structure. Tricuspid valve regurgitation is trivial. No evidence of tricuspid stenosis. Aortic Valve: The aortic valve is tricuspid. Aortic valve regurgitation is not visualized. No aortic stenosis is present. Aortic valve mean gradient measures 4.0 mmHg. Aortic valve peak gradient measures 7.2 mmHg. Pulmonic Valve: The pulmonic valve was normal in structure. Pulmonic valve regurgitation is not visualized. No evidence of pulmonic stenosis. Aorta: The aortic root is normal in size and structure. Venous: The inferior vena cava is normal in size with greater than 50% respiratory variability, suggesting right atrial pressure of 3 mmHg. IAS/Shunts: The interatrial septum is aneurysmal. No atrial level shunt detected by color flow Doppler.  LEFT VENTRICLE PLAX 2D LVIDd:         4.30 cm   Diastology LVIDs:         2.80 cm   LV e' medial:    5.44 cm/s LV PW:         0.90 cm   LV E/e' medial:  17.2 LV IVS:        0.90 cm   LV e' lateral:   7.29 cm/s LVOT diam:     1.80 cm   LV E/e' lateral: 12.9 LV SV:         50 LV SV Index:   29 LVOT Area:     2.54 cm  RIGHT VENTRICLE RV Basal diam:  2.20 cm  RV Mid diam:    1.80 cm RV S prime:     11.40 cm/s TAPSE (M-mode): 2.0 cm LEFT ATRIUM             Index        RIGHT ATRIUM          Index LA diam:        3.20 cm 1.85 cm/m   RA Area:     8.33 cm LA Vol (A2C):   34.1 ml 19.73 ml/m  RA Volume:   11.80 ml 6.83 ml/m LA Vol (A4C):   44.9 ml 25.98 ml/m LA Biplane Vol: 41.2 ml 23.84 ml/m  AORTIC VALVE AV Area (Vmax):    1.56 cm AV Area (Vmean):   1.48 cm AV Area (VTI):     1.99 cm AV Vmax:           134.00 cm/s AV Vmean:          95.000 cm/s AV VTI:  0.251 m AV Peak Grad:      7.2 mmHg AV Mean Grad:      4.0 mmHg LVOT Vmax:         82.30 cm/s LVOT Vmean:        55.100 cm/s LVOT VTI:          0.196 m LVOT/AV VTI ratio: 0.78  AORTA Ao Root diam: 2.60 cm Ao Asc diam:  2.90 cm MITRAL VALVE                TRICUSPID VALVE MV Area (PHT): 4.80 cm     TR Peak grad:   25.8 mmHg MV Decel Time: 158 msec     TR Vmax:        254.00 cm/s MV E velocity: 93.80 cm/s MV A velocity: 138.00 cm/s  SHUNTS MV E/A ratio:  0.68         Systemic VTI:  0.20 m                             Systemic Diam: 1.80 cm Redell Shallow MD Electronically signed by Redell Shallow MD Signature Date/Time: 11/19/2023/2:09:35 PM    Final    DG Chest Portable 1 View Result Date: 11/19/2023 CLINICAL DATA:  Shortness of breath. EXAM: PORTABLE CHEST 1 VIEW COMPARISON:  August 18, 2023.  November 17, 2023. FINDINGS: Normal cardiac size. Right lung is clear. Left perihilar density is noted which corresponds to cavitary mass noted on prior CT scan. Left lower lobe airspace opacity is noted most consistent with pneumonia or atelectasis. The visualized skeletal structures are unremarkable. IMPRESSION: Left perihilar density is noted which corresponds to cavitary mass seen on recent CT scan. Large left lower lobe airspace opacity is noted most consistent with postobstructive pneumonia or atelectasis. Electronically Signed   By: Lynwood Landy Raddle M.D.   On: 11/19/2023 10:15    Pending Labs Unresulted Labs (From  admission, onward)     Start     Ordered   11/26/23 0500  Creatinine, serum  (enoxaparin  (LOVENOX )    CrCl >/= 30 ml/min)  Weekly,   R     Comments: while on enoxaparin  therapy    11/19/23 1213   11/20/23 0500  Comprehensive metabolic panel  Tomorrow morning,   R        11/19/23 1213   11/20/23 0500  CBC  Tomorrow morning,   R        11/19/23 1213            Vitals/Pain Today's Vitals   11/19/23 1100 11/19/23 1200 11/19/23 1303 11/19/23 1400  BP: 127/63   123/65  Pulse: 84   80  Resp: (!) 26   (!) 39  Temp:   98.3 F (36.8 C)   TempSrc:   Oral   SpO2: 98%   96%  Weight:  72.6 kg    PainSc:        Isolation Precautions No active isolations  Medications Medications  albuterol  (PROVENTIL ) (2.5 MG/3ML) 0.083% nebulizer solution (0 mg/hr Nebulization Stopped 11/19/23 1154)  enoxaparin  (LOVENOX ) injection 40 mg (has no administration in time range)  sodium chloride  flush (NS) 0.9 % injection 3 mL (3 mLs Intravenous Not Given 11/19/23 1252)  acetaminophen  (TYLENOL ) tablet 650 mg (has no administration in time range)    Or  acetaminophen  (TYLENOL ) suppository 650 mg (has no administration in time range)  ondansetron  (ZOFRAN ) tablet 4 mg (has no administration in time  range)    Or  ondansetron  (ZOFRAN ) injection 4 mg (has no administration in time range)  ipratropium-albuterol  (DUONEB) 0.5-2.5 (3) MG/3ML nebulizer solution 3 mL (3 mLs Nebulization Given 11/19/23 1404)  ceFEPIme  (MAXIPIME ) 2 g in sodium chloride  0.9 % 100 mL IVPB (0 g Intravenous Stopped 11/19/23 1433)  vancomycin  (VANCOREADY) IVPB 1500 mg/300 mL (1,500 mg Intravenous New Bag/Given 11/19/23 1436)  vancomycin  (VANCOCIN ) IVPB 1000 mg/200 mL premix (has no administration in time range)  methylPREDNISolone  sodium succinate  (SOLU-MEDROL ) 40 mg/mL injection 40 mg (has no administration in time range)  aspirin  EC tablet 81 mg (has no administration in time range)  amLODipine  (NORVASC ) tablet 10 mg (has no administration in  time range)  atorvastatin  (LIPITOR) tablet 10 mg (has no administration in time range)  glimepiride  (AMARYL ) tablet 2 mg (has no administration in time range)  insulin  glargine-yfgn (SEMGLEE ) injection 22 Units (has no administration in time range)  Sirolimus  (RAPAMUNE ) tablet 2 mg (has no administration in time range)  tacrolimus  ER (ENVARSUS  XR) tablet 1 mg (has no administration in time range)  insulin  aspart (novoLOG ) injection 0-15 Units (has no administration in time range)  insulin  aspart (novoLOG ) injection 0-5 Units (has no administration in time range)  albuterol  (PROVENTIL ) (2.5 MG/3ML) 0.083% nebulizer solution 2.5 mg (has no administration in time range)  magnesium  sulfate IVPB 2 g 50 mL (0 g Intravenous Stopped 11/19/23 1155)  methylPREDNISolone  sodium succinate  (SOLU-MEDROL ) 125 mg/2 mL injection 125 mg (125 mg Intravenous Given 11/19/23 1052)  cefTRIAXone  (ROCEPHIN ) 1 g in sodium chloride  0.9 % 100 mL IVPB (0 g Intravenous Stopped 11/19/23 1224)  azithromycin  (ZITHROMAX ) 500 mg in sodium chloride  0.9 % 250 mL IVPB (0 mg Intravenous Stopped 11/19/23 1351)  furosemide  (LASIX ) injection 40 mg (40 mg Intravenous Given 11/19/23 1403)    Mobility manual wheelchair     Focused Assessments    R Recommendations: See Admitting Provider Note  Report given to:   Additional Notes:

## 2023-11-19 NOTE — Telephone Encounter (Signed)
 Copied from CRM [id number]. Topic: Clinical - Lab/Test Results >> Nov 13, 2023  4:48 PM Celestine FALCON wrote: Reason for CRM: Pt's spouse Taiya Nutting is calling for the results from the PFT completed on 10/21/2023. Please call the pt back at [phone] ok to leave a vm.  Lauraine can you please advise of PFT results.

## 2023-11-19 NOTE — H&P (Addendum)
 History and Physical    Patient: Pamela [name] FMW:[id number] DOB: 01/06/1951 DOA: 11/19/2023 DOS: the patient was seen and examined on 11/19/2023 PCP: Ransom Other, MD  Patient coming from: Home  Chief Complaint:  Chief Complaint  Patient presents with   Respiratory Distress   HPI: Pamela [name] is a 72 y.o. female with medical history significant of left lung cancer recently completed chemotherapy mid June 2025, COPD not on oxygen, hypertension, dyslipidemia, diabetes mellitus on insulin , CAD, anemia, history of renal transplant followed at [facility], history of HFrEF with recovered EF as of echo 2020.  Patient has completed chemotherapy and radiation as of mid June.  While undergoing chemotherapy patient had issues with recurrent nausea without vomiting and poor oral intake as well as thrombocytopenia which has resolved.  Patient reports that she was in her usual state of health when about 1 week ago she developed abrupt onset of resting shortness of breath with wheezing, dyspnea on exertion and orthopnea.  She developed a nonproductive cough.  No fever or chills.  No sick contacts.  She has also been having some swelling in her legs which was new.  As of the symptoms she had been using her home prescribed nebulizers and inhalers but without improvement in symptoms.  She reports her baseline O2 sats around 93%.  She went to see her primary care physician today and was noted to have a room air sat of 60%.  Received 2 DuoNebs en route to the hospital.  Upon presentation she had extremely diminished breath sounds and but O2 sats had improved to 100% upon arrival.  She transiently had increased work of breathing which gradually improved after additional treatments with IV Solu-Medrol , magnesium , and bronchodilators.  Chest x-ray redemonstrated cavitary mass that corresponded to recent CT scan.  There is large left lower lobe airspace opacity most consistent with postobstructive pneumonia or atelectasis.   Her BNP was elevated at 698.  EDP treated pneumonia symptoms with IV Zithromax  and Rocephin .  Hospitalist service was consulted to evaluate the patient for admission.  Review of Systems: As mentioned in the history of present illness. All other systems reviewed and are negative. Past Medical History:  Diagnosis Date   Anemia    Anxiety    CHF (congestive heart failure) (HCC)    Chronic kidney disease    COPD (chronic obstructive pulmonary disease) (HCC)    Coronary artery disease    Diabetes mellitus without complication (HCC) 10/29/2012   ESRD on dialysis (HCC) 01/25/2015   Headache    Hypertension    PONV (postoperative nausea and vomiting)    Shortness of breath dyspnea    Past Surgical History:  Procedure Laterality Date   ABDOMINAL HYSTERECTOMY     ANAL FISSURE REPAIR     APPENDECTOMY     '74- open with gallbladder   AV FISTULA PLACEMENT Left 01/31/2015   Procedure: ARTERIOVENOUS FISTULA CREATION-LEFT BRACHIO-CEPHALIC ;  Surgeon: Redell LITTIE Door, MD;  Location: [facility] OR;  Service: Vascular;  Laterality: Left;   AV FISTULA PLACEMENT Left 04/04/2015   Procedure: INSERTION OF ARTERIOVENOUS (AV) GORE-TEX GRAFT ARM;  Surgeon: Carlin FORBES Haddock, MD;  Location: [facility] OR;  Service: Vascular;  Laterality: Left;   BILIARY BRUSHING  10/01/2023   Procedure: BRUSH BIOPSY, BILE DUCT;  Surgeon: Saintclair Jasper, MD;  Location: [facility] ENDOSCOPY;  Service: Gastroenterology;;   BREAST EXCISIONAL BIOPSY Left    BREAST SURGERY     cyst removed   BRONCHIAL BIOPSY  08/18/2023   Procedure:  BRONCHOSCOPY, WITH BIOPSY;  Surgeon: Shelah Lamar RAMAN, MD;  Location: [facility] ENDOSCOPY;  Service: Pulmonary;;   BRONCHIAL BRUSHINGS  08/18/2023   Procedure: BRONCHOSCOPY, WITH BRUSH BIOPSY;  Surgeon: Shelah Lamar RAMAN, MD;  Location: [facility] ENDOSCOPY;  Service: Pulmonary;;   CARDIAC CATHETERIZATION     1 coronary stent placed   CHOLECYSTECTOMY     '74-open   COLONOSCOPY WITH PROPOFOL  N/A 11/17/2012   Procedure: COLONOSCOPY WITH PROPOFOL ;   Surgeon: Gladis MARLA Louder, MD;  Location: [facility] ENDOSCOPY;  Service: Endoscopy;  Laterality: N/A;   CORONARY STENT PLACEMENT     ELBOW SURGERY Right    tendon surgery   ERCP N/A 10/01/2023   Procedure: ERCP, WITH INTERVENTION IF INDICATED;  Surgeon: Saintclair Jasper, MD;  Location: [facility] ENDOSCOPY;  Service: Gastroenterology;  Laterality: N/A;   ESOPHAGOGASTRODUODENOSCOPY (EGD) WITH PROPOFOL  N/A 11/17/2012   Procedure: ESOPHAGOGASTRODUODENOSCOPY (EGD) WITH PROPOFOL ;  Surgeon: Gladis MARLA Louder, MD;  Location: [facility] ENDOSCOPY;  Service: Endoscopy;  Laterality: N/A;   GANGLION CYST EXCISION Bilateral 10/29/2012   wrist   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   LEFT HEART CATHETERIZATION WITH CORONARY ANGIOGRAM N/A 04/14/2014   Procedure: LEFT HEART CATHETERIZATION WITH CORONARY ANGIOGRAM;  Surgeon: Victory LELON Claudene DOUGLAS, MD;  Location: [facility] CATH LAB;  Service: Cardiovascular;  Laterality: N/A;   PERIPHERAL VASCULAR CATHETERIZATION N/A 03/09/2015   Procedure: Fistulagram;  Surgeon: Redell LITTIE Door, MD;  Location: [facility] INVASIVE CV LAB;  Service: Cardiovascular;  Laterality: N/A;   STONE EXTRACTION WITH BASKET  10/01/2023   Procedure: ERCP, WITH LITHROTRIPSY OR REMOVAL OF COMMON BILE DUCT CALCULUS USING BASKET;  Surgeon: Saintclair Jasper, MD;  Location: [facility] ENDOSCOPY;  Service: Gastroenterology;;   TEE WITHOUT CARDIOVERSION N/A 01/20/2015   Procedure: TRANSESOPHAGEAL ECHOCARDIOGRAM (TEE);  Surgeon: Vina Okey GAILS, MD;  Location: [facility] ENDOSCOPY;  Service: Cardiovascular;  Laterality: N/A;   TUBAL LIGATION     VIDEO BRONCHOSCOPY  08/18/2023   Procedure: BRONCHOSCOPY, WITH FLUOROSCOPY;  Surgeon: Shelah Lamar RAMAN, MD;  Location: [facility] ENDOSCOPY;  Service: Pulmonary;;   Social History:  reports that she quit smoking about 31 years ago. Her smoking use included cigarettes. She has never used smokeless tobacco. She reports that she does not drink alcohol and does not use drugs.  No Known Allergies  Family History  Problem Relation Age of Onset    Diabetes Father    Cancer Father    Hypertension Father    Heart attack Mother    Hypertension Mother    Diabetes Sister    Hypertension Sister    Cancer Sister    Hypertension Sister    Breast cancer Sister     Prior to Admission medications   Medication Sig Start Date End Date Taking? Authorizing Provider  Accu-Chek Softclix Lancets lancets DX: E11.21 as directed 12/06/16   [provider]  albuterol  (VENTOLIN  HFA) 108 (90 Base) MCG/ACT inhaler Inhale 2 puffs into the lungs See admin instructions. Inhale 2 puffs into the lungs in the morning and at bedtime- may use an additional 2 puffs twice a day as needed for shortness of breath or wheezing    [provider]  amLODipine  (NORVASC ) 10 MG tablet Take 10 mg by mouth daily. 03/04/17   [provider]  aspirin  EC 81 MG tablet Take 81 mg by mouth daily.    [provider]  atorvastatin  (LIPITOR) 10 MG tablet Take 1 tablet (10 mg total) by mouth daily. Patient taking differently: Take 10 mg by mouth at bedtime. 06/08/20   Claudene,  Victory ORN, MD  Blood Glucose Calibration (ACCU-CHEK AVIVA) SOLN DX: E11.21 as directed 12/06/16   [provider]  carvedilol  (COREG ) 12.5 MG tablet Take 1 tablet (12.5 mg total) by mouth 2 (two) times daily. Patient not taking: Reported on 09/29/2023 06/08/20   Claudene Victory ORN, MD  glimepiride  (AMARYL ) 2 MG tablet Take 2 mg by mouth daily.    [provider]  glucose blood (ACCU-CHEK AVIVA PLUS) test strip use as directed to check  blood sugar 4 times daily    [provider]  insulin  lispro (HUMALOG) 100 UNIT/ML KiwkPen Inject 8-13 Units into the skin See admin instructions. Inject 8-13 units into the skin three times a day with meals, PER SLIDING SCALE (when able to eat and tolerate) 01/22/18   [provider]  LANTUS  SOLOSTAR 100 UNIT/ML Solostar Pen Inject 22 Units into the skin in the morning.    [provider]  latanoprost  (XALATAN ) 0.005  % ophthalmic solution Place 1 drop into both eyes at bedtime.    [provider]  lidocaine  (XYLOCAINE ) 2 % solution Patient: Mix 1part 2% viscous lidocaine , 1part H20. Swallow 10mL of diluted mixture, before meals and at bedtime, up to QID 10/13/23   Izell Domino, MD  nitroGLYCERIN  (NITROSTAT ) 0.4 MG SL tablet Place 0.4 mg under the tongue every 5 (five) minutes as needed for chest pain.    [provider]  ondansetron  (ZOFRAN ) 8 MG tablet Take 1 tablet (8 mg total) by mouth every 8 (eight) hours as needed for nausea or vomiting. Start on the third day after chemotherapy. 09/09/23   Sherrod Sherrod, MD  oxyCODONE -acetaminophen  (PERCOCET/ROXICET) 5-325 MG tablet Take 1 tablet by mouth every 6 (six) hours as needed for severe pain (pain score 7-10). 09/22/23   Shannon Agent, MD  predniSONE  (DELTASONE ) 5 MG tablet Take 5 mg by mouth daily with breakfast.  12/22/17   [provider]  prochlorperazine  (COMPAZINE ) 10 MG tablet Take 1 tablet (10 mg total) by mouth every 6 (six) hours as needed for nausea or vomiting. 09/09/23   Sherrod Sherrod, MD  sirolimus  (RAPAMUNE ) 1 MG tablet Take 2 mg by mouth daily. 09/16/23   [provider]  tacrolimus  ER (ENVARSUS  XR) 1 MG TB24 Take 1 mg by mouth daily. 08/10/20   [provider]  umeclidinium-vilanterol (ANORO ELLIPTA) 62.5-25 MCG/INH AEPB Inhale 1 puff into the lungs daily.  01/07/18   [provider]    Physical Exam: Vitals:   11/19/23 0915 11/19/23 0919 11/19/23 1100  BP: 137/70  127/63  Pulse: 88  84  Resp: (!) 44  (!) 26  Temp:  98.7 F (37.1 C)   TempSrc:  Oral   SpO2: 100%  98%   Constitutional: NAD, calm, comfortable Respiratory: Bilateral lung sounds on posterior exam revealed good airway movement but with coarse rales and no wheezing.. Normal respiratory effort. No accessory muscle use.  2 L oxygen Cardiovascular: Regular rate and rhythm, no murmurs / rubs / gallops. No extremity edema. 2+  pedal pulses.  Abdomen: no tenderness, no masses palpated. No hepatosplenomegaly. Bowel sounds positive.  Musculoskeletal: no clubbing / cyanosis. No joint deformity upper and lower extremities. Good ROM, no contractures. Normal muscle tone.  Skin: no rashes, lesions, ulcers. No induration Neurologic: CN 2-12 grossly intact. Sensation intact.  Strength 5/5 x all 4 extremities.  Psychiatric: Normal judgment and insight. Alert and oriented x 3. Normal mood.     Data Reviewed:  Sodium 131, potassium 4.0, chloride  90, CO2 29, glucose 205, BUN 22, creatinine 0.79  BNP 648  WBC 12,100, differential not obtained, hemoglobin 9.8, platelets 179,000  PCR for COVID, influenza and RSV negative  Chest x-ray as above  Assessment and Plan: Acute hypoxemic respiratory failure secondary to: **Presented after 1 week of abrupt onset of shortness of breath consistent with both COPD exacerbation and likely volume overload.  No fevers or chills but x-ray concerning for pneumonia Wean O2 as tolerated COPD exacerbation Continue Solu-Medrol  40 mg IV daily Scheduled DuoNebs As needed albuterol  nebs Left lower lobe pneumonia in immunocompromised patient Given the fact that she is on immunosuppressive medications we will broaden antibiotics to vancomycin  and cefepime  with pharmacy dosing Incentive spirometry and early mobilization Suspected heart failure exacerbation History of HFrEF with recovered EF in 2020 Lasix  40 mg IV x 1.  Patient had previously been on Lasix  in context of previous history of HFrEF but this was discontinued post renal transplant-follow creatinine closely-check echocardiogram Daily weights with intake and output-not on beta-blocker or ARB/ACE 1 prior to admission    Stage IIIA non-small cell lung cancer Completed chemoradiation with carboplatin  and paclitaxel  as of October 31, 2023 Previous GI symptoms related to chemotherapy have resolved  Thrombocytopenia Secondary to  chemotherapy and has resolved  Diabetes mellitus 2 on insulin  Continue Amaryl  and Lantus  Follow CBGs and provide SSI 09/30/2023 hemoglobin A1c was 7.4-patient was receiving prednisone  during chemotherapy treatments   Renal transplant Continue long-acting tacrolimus  as well as Rapamune   Hypertension Continue Norvasc    CAD/dyslipidemia Continue baby aspirin  and statin    Advance Care Planning:   Code Status: Full Code   VTE prophylaxis: Lovenox   Consults: None  Family Communication: Family at bedside  Severity of Illness: The appropriate patient status for this patient is OBSERVATION. Observation status is judged to be reasonable and necessary in order to provide the required intensity of service to ensure the patient's safety. The patient's presenting symptoms, physical exam findings, and initial radiographic and laboratory data in the context of their medical condition is felt to place them at decreased risk for further clinical deterioration. Furthermore, it is anticipated that the patient will be medically stable for discharge from the hospital within 2 midnights of admission.   Author: Isaiah Lever, NP 11/19/2023 12:13 PM  For on call review [email or web address].

## 2023-11-19 NOTE — Plan of Care (Signed)

## 2023-11-19 NOTE — Progress Notes (Signed)
*  PRELIMINARY RESULTS* Echocardiogram 2D Echocardiogram has been performed.  Pamela [name] Stallion 11/19/2023, 1:39 PM

## 2023-11-19 NOTE — Progress Notes (Signed)
 Pharmacy Antibiotic Note  Pamela [name] is a 73 y.o. female with hx lung cancer  on chemotherapy PTA who presented to the ED on 11/19/2023 with c/o SOB. CXR showed findings with concern for PNA. Pharmacy has been consulted to dose vancomycin  and cefepime  for infection.  Today, 11/19/2023: - WBC elevated 12.1 - scr 0.79 (crcl~58)  Plan: - cefepime  2gm IV q12h - vancomycin  1500 mg IV x1, then 1000 mg q24h for est AUC 442  __________________________________________  Temp (24hrs), Avg:98.7 F (37.1 C), Min:98.7 F (37.1 C), Max:98.7 F (37.1 C)  Recent Labs  Lab 11/17/23 0917 11/19/23 1059  WBC 13.5* 12.1*  CREATININE 0.70 0.79    CrCl cannot be calculated (Unknown ideal weight.).    No Known Allergies   Thank you for allowing pharmacy to be a part of this patient's care.  Pamela [name] 11/19/2023 12:14 PM

## 2023-11-20 DIAGNOSIS — T451X5A Adverse effect of antineoplastic and immunosuppressive drugs, initial encounter: Secondary | ICD-10-CM | POA: Diagnosis present

## 2023-11-20 DIAGNOSIS — Z1152 Encounter for screening for COVID-19: Secondary | ICD-10-CM | POA: Diagnosis not present

## 2023-11-20 DIAGNOSIS — E871 Hypo-osmolality and hyponatremia: Secondary | ICD-10-CM | POA: Diagnosis not present

## 2023-11-20 DIAGNOSIS — D630 Anemia in neoplastic disease: Secondary | ICD-10-CM | POA: Diagnosis present

## 2023-11-20 DIAGNOSIS — Z7984 Long term (current) use of oral hypoglycemic drugs: Secondary | ICD-10-CM | POA: Diagnosis not present

## 2023-11-20 DIAGNOSIS — D696 Thrombocytopenia, unspecified: Secondary | ICD-10-CM | POA: Diagnosis not present

## 2023-11-20 DIAGNOSIS — J432 Centrilobular emphysema: Secondary | ICD-10-CM | POA: Diagnosis present

## 2023-11-20 DIAGNOSIS — Z7982 Long term (current) use of aspirin: Secondary | ICD-10-CM | POA: Diagnosis not present

## 2023-11-20 DIAGNOSIS — J189 Pneumonia, unspecified organism: Secondary | ICD-10-CM | POA: Diagnosis present

## 2023-11-20 DIAGNOSIS — J438 Other emphysema: Secondary | ICD-10-CM | POA: Diagnosis present

## 2023-11-20 DIAGNOSIS — I5042 Chronic combined systolic (congestive) and diastolic (congestive) heart failure: Secondary | ICD-10-CM | POA: Diagnosis not present

## 2023-11-20 DIAGNOSIS — J441 Chronic obstructive pulmonary disease with (acute) exacerbation: Secondary | ICD-10-CM | POA: Diagnosis not present

## 2023-11-20 DIAGNOSIS — D649 Anemia, unspecified: Secondary | ICD-10-CM | POA: Diagnosis not present

## 2023-11-20 DIAGNOSIS — R918 Other nonspecific abnormal finding of lung field: Secondary | ICD-10-CM | POA: Diagnosis not present

## 2023-11-20 DIAGNOSIS — Z85118 Personal history of other malignant neoplasm of bronchus and lung: Secondary | ICD-10-CM | POA: Diagnosis not present

## 2023-11-20 DIAGNOSIS — J9601 Acute respiratory failure with hypoxia: Secondary | ICD-10-CM | POA: Diagnosis not present

## 2023-11-20 DIAGNOSIS — I110 Hypertensive heart disease with heart failure: Secondary | ICD-10-CM | POA: Diagnosis present

## 2023-11-20 DIAGNOSIS — I2510 Atherosclerotic heart disease of native coronary artery without angina pectoris: Secondary | ICD-10-CM | POA: Diagnosis present

## 2023-11-20 DIAGNOSIS — Y842 Radiological procedure and radiotherapy as the cause of abnormal reaction of the patient, or of later complication, without mention of misadventure at the time of the procedure: Secondary | ICD-10-CM | POA: Diagnosis present

## 2023-11-20 DIAGNOSIS — R0603 Acute respiratory distress: Secondary | ICD-10-CM | POA: Diagnosis not present

## 2023-11-20 DIAGNOSIS — E1122 Type 2 diabetes mellitus with diabetic chronic kidney disease: Secondary | ICD-10-CM | POA: Diagnosis not present

## 2023-11-20 DIAGNOSIS — C3490 Malignant neoplasm of unspecified part of unspecified bronchus or lung: Secondary | ICD-10-CM | POA: Diagnosis not present

## 2023-11-20 DIAGNOSIS — Z940 Kidney transplant status: Secondary | ICD-10-CM | POA: Diagnosis not present

## 2023-11-20 DIAGNOSIS — J700 Acute pulmonary manifestations due to radiation: Secondary | ICD-10-CM | POA: Diagnosis not present

## 2023-11-20 DIAGNOSIS — Z7960 Long term (current) use of unspecified immunomodulators and immunosuppressants: Secondary | ICD-10-CM | POA: Diagnosis not present

## 2023-11-20 DIAGNOSIS — Z992 Dependence on renal dialysis: Secondary | ICD-10-CM | POA: Diagnosis not present

## 2023-11-20 DIAGNOSIS — J984 Other disorders of lung: Secondary | ICD-10-CM | POA: Diagnosis not present

## 2023-11-20 DIAGNOSIS — Z794 Long term (current) use of insulin: Secondary | ICD-10-CM | POA: Diagnosis not present

## 2023-11-20 DIAGNOSIS — E785 Hyperlipidemia, unspecified: Secondary | ICD-10-CM | POA: Diagnosis present

## 2023-11-20 DIAGNOSIS — C3432 Malignant neoplasm of lower lobe, left bronchus or lung: Secondary | ICD-10-CM | POA: Diagnosis not present

## 2023-11-20 DIAGNOSIS — D84821 Immunodeficiency due to drugs: Secondary | ICD-10-CM | POA: Diagnosis not present

## 2023-11-20 DIAGNOSIS — J440 Chronic obstructive pulmonary disease with acute lower respiratory infection: Secondary | ICD-10-CM | POA: Diagnosis not present

## 2023-11-20 LAB — CBC
HCT: 28.3 % — ABNORMAL LOW (ref 36.0–46.0)
Hemoglobin: 9.1 g/dL — ABNORMAL LOW (ref 12.0–15.0)
MCH: 27.2 pg (ref 26.0–34.0)
MCHC: 32.2 g/dL (ref 30.0–36.0)
MCV: 84.7 fL (ref 80.0–100.0)
Platelets: 164 K/uL (ref 150–400)
RBC: 3.34 MIL/uL — ABNORMAL LOW (ref 3.87–5.11)
RDW: 14.3 % (ref 11.5–15.5)
WBC: 8.8 K/uL (ref 4.0–10.5)
nRBC: 0 % (ref 0.0–0.2)

## 2023-11-20 LAB — OSMOLALITY, URINE: Osmolality, Ur: 514 mosm/kg (ref 300–900)

## 2023-11-20 LAB — OSMOLALITY: Osmolality: 296 mosm/kg — ABNORMAL HIGH (ref 275–295)

## 2023-11-20 LAB — GLUCOSE, CAPILLARY
Glucose-Capillary: 306 mg/dL — ABNORMAL HIGH (ref 70–99)
Glucose-Capillary: 318 mg/dL — ABNORMAL HIGH (ref 70–99)
Glucose-Capillary: 391 mg/dL — ABNORMAL HIGH (ref 70–99)
Glucose-Capillary: 321 mg/dL — ABNORMAL HIGH (ref 70–99)
Glucose-Capillary: 316 mg/dL — ABNORMAL HIGH (ref 70–99)

## 2023-11-20 LAB — COMPREHENSIVE METABOLIC PANEL WITH GFR
ALT: 33 U/L (ref 0–44)
AST: 28 U/L (ref 15–41)
Albumin: 2.3 g/dL — ABNORMAL LOW (ref 3.5–5.0)
Alkaline Phosphatase: 83 U/L (ref 38–126)
Anion gap: 11 (ref 5–15)
BUN: 24 mg/dL — ABNORMAL HIGH (ref 8–23)
CO2: 27 mmol/L (ref 22–32)
Calcium: 8.3 mg/dL — ABNORMAL LOW (ref 8.9–10.3)
Chloride: 88 mmol/L — ABNORMAL LOW (ref 98–111)
Creatinine, Ser: 0.75 mg/dL (ref 0.44–1.00)
GFR, Estimated: >60 mL/min (ref >60)
Glucose, Bld: 299 mg/dL — ABNORMAL HIGH (ref 70–99)
Potassium: 3.5 mmol/L (ref 3.5–5.1)
Sodium: 126 mmol/L — ABNORMAL LOW (ref 135–145)
Total Bilirubin: 0.8 mg/dL (ref 0.0–1.2)
Total Protein: 6.3 g/dL — ABNORMAL LOW (ref 6.5–8.1)

## 2023-11-20 LAB — SODIUM, URINE, RANDOM: Sodium, Ur: 16 mmol/L

## 2023-11-20 MED ORDER — INSULIN GLARGINE-YFGN 100 UNIT/ML ~~LOC~~ SOLN
28.0000 [IU] | Freq: Every day | SUBCUTANEOUS | Status: DC
  Administered 2023-11-21: 28 [IU] via SUBCUTANEOUS
  Filled 2023-11-20: qty 0.28

## 2023-11-20 MED ORDER — IPRATROPIUM-ALBUTEROL 0.5-2.5 (3) MG/3ML IN SOLN
3.0000 mL | Freq: Three times a day (TID) | RESPIRATORY_TRACT | Status: DC
  Administered 2023-11-20 – 2023-11-23 (×10): 3 mL via RESPIRATORY_TRACT
  Filled 2023-11-20 (×9): qty 3

## 2023-11-20 NOTE — Telephone Encounter (Signed)
 Called and spoke with patient spouse,patient is currently in the hospital,began to ask about oxygen,patients oxygen dropped the 60s that is why she is currently admitted along with COPD flare.Told patient spouse once she gets discharged make a hospital follow up with sarah if she gets discharged with oxygen and we can walk her in office to order oxygen for her.Patient verbalized understanding to sarahs note.Will route to sarah as an FYI

## 2023-11-20 NOTE — Hospital Course (Addendum)
 Pamela [name] is a 73 y.o. female with a history of lung cancer status post chemoradiation, COPD, hyperlipidemia, hypertension, diabetes, CAD, renal transplant, heart failure with reduced EF.  Patient presented secondary to shortness of breath and wheezing with initial concern for postobstructive pneumonia with associated acute respiratory failure.  Patient initially managed with IV antibiotics without significant improvement.  CT imaging consistent with radiation pneumonitis and patient was managed on steroids instead.  Pulmonology was consulted for management as well.  Patient with improvement on current regimen of steroids and is now initiating a taper.

## 2023-11-20 NOTE — TOC Initial Note (Signed)
 Transition of Care [facility]) - Initial/Assessment Note    Patient Details  Name: Pamela [name] MRN: [id number] Date of Birth: August 31, 1950  Transition of Care [facility]) CM/SW Contact:    Sonda Manuella Quill, RN Phone Number: 11/20/2023, 2:41 PM  Clinical Narrative:                 [place] w/ pt and husband in room; pt says she lives at home w/ her spouse Laurian Edrington [phone]); she plans to return at d/c; her husband will provide transportation; pt verified insurance/PCP; she denied SDOH risks; pt says she has cane walker, wheelchair, BSC, and shower chair; she does not have HH services, or home oxygen; TOC will follow.  Expected Discharge Plan: Home/Self Care Barriers to Discharge: Continued Medical Work up   Patient Goals and CMS Choice Patient states their goals for this hospitalization and ongoing recovery are:: home CMS Medicare.gov Compare Post Acute Care list provided to:: Patient    ownership interest in [facility]provided to:: Patient    Expected Discharge Plan and Services   Discharge Planning Services: CM Consult   Living arrangements for the past 2 months: Single Family Home                                      Prior Living Arrangements/Services Living arrangements for the past 2 months: Single Family Home Lives with:: Spouse Patient language and need for interpreter reviewed:: Yes Do you feel safe going back to the place where you live?: Yes      Need for Family Participation in Patient Care: Yes (Comment) Care giver support system in place?: Yes (comment) Current home services: DME (cane, walker, wheelchair, BSC, shower chair) Criminal Activity/Legal Involvement Pertinent to Current Situation/Hospitalization: No - Comment as needed  Activities of Daily Living   ADL Screening (condition at time of admission) Independently performs ADLs?: Yes (appropriate for developmental age) Is the patient deaf or have difficulty hearing?:  No Does the patient have difficulty seeing, even when wearing glasses/contacts?: Yes Does the patient have difficulty concentrating, remembering, or making decisions?: No  Permission Sought/Granted Permission sought to share information with : Case Manager Permission granted to share information with : Yes, Verbal Permission Granted  Share Information with NAME: Case Manager     Permission granted to share info w Relationship: Felina Tello (spouse) [phone]     Emotional Assessment Appearance:: Appears stated age Attitude/Demeanor/Rapport: Gracious Affect (typically observed): Accepting Orientation: : Oriented to Self, Oriented to Place, Oriented to  Time, Oriented to Situation Alcohol / Substance Use: Not Applicable Psych Involvement: No (comment)  Admission diagnosis:  COPD exacerbation (HCC) [J44.1] Acute respiratory failure with hypoxia (HCC) [J96.01] Pneumonia due to infectious organism, unspecified laterality, unspecified part of lung [J18.9] Patient Active Problem List   Diagnosis Date Noted   Acute respiratory failure with hypoxia (HCC) 11/19/2023   Biliary tract imaging abnormality 10/01/2023   Elevated LFTs 09/29/2023   Encounter for antineoplastic chemotherapy 09/23/2023   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) 09/09/2023   Mass of left lung 08/18/2023   Anemia 11/23/2017   DM (diabetes mellitus) (HCC) 02/01/2015   Pressure ulcer 01/24/2015   Chronic systolic heart failure (HCC)    Edema    COPD (chronic obstructive pulmonary disease) (HCC)    COPD, severe (HCC) 03/25/2014   Essential hypertension 03/25/2014   Hyperlipidemia 03/25/2014   Diabetes  mellitus due to underlying condition with circulatory complication (HCC) 03/25/2014   Anemia, iron  deficiency 03/25/2014   CAD in native artery 03/25/2014   PCP:  Ransom Other, MD Pharmacy:   [facility] [phone] - [place], [place] - [address] AT [facility] [address]  [place] [address] Phone: [phone] Fax: [phone]     Social Drivers of Health (SDOH) Social History: SDOH Screenings   Food Insecurity: No Food Insecurity (11/20/2023)  Housing: Unknown (11/20/2023)  Transportation Needs: No Transportation Needs (11/20/2023)  Utilities: Not At Risk (11/20/2023)  Depression (PHQ2-9): Low Risk  (09/08/2023)  Social Connections: Socially Integrated (11/19/2023)  Tobacco Use: Medium Risk (11/13/2023)   Received from [facility]   SDOH Interventions: Food Insecurity Interventions: Intervention Not Indicated, Inpatient TOC Housing Interventions: Intervention Not Indicated, Inpatient TOC Transportation Interventions: Intervention Not Indicated, Inpatient TOC Utilities Interventions: Intervention Not Indicated, Inpatient TOC   Readmission Risk Interventions     No data to display

## 2023-11-20 NOTE — Progress Notes (Addendum)
 Progress Note   Patient: Pamela [name] FMW:[id number] DOB: 12-Mar-1951 DOA: 11/19/2023     0 DOS: the patient was seen and examined on 11/20/2023   Brief hospital course: 73 year old female with history of lung cancer recently completing chemotherapy, COPD, hyperlipidemia, hypertension, diabetes, CAD, history of renal transplant at [facility] in 2019, history of heart failure with reduced EF who presented to the emergency department with complaints of shortness of breath with wheezing patient reported no significant [place] despite neb treatments. In the emergency department, chest x-ray was notable for a cavitary mass that correspond to recent CT chest with large left lower airspace opacity consistent with postobstructive pneumonia. Patient also noted to have an elevated BNP of 698. Patient was started on empiric antibiotics and 1 dose of IV Lasix . Hospitalist consulted for consideration for medical admission   Assessment and Plan: Acute hypoxemic respiratory failure -Chest imaging concerning for pneumonia. - Will continue cefepime . MRSA swab neg, thus vanc was discontinued -Patient is status post 1 dose of IV Lasix  in the emergency department - Continue supplemental O2 as needed, wean as tolerated.  Patient is O2 nave. May need O2 at time of d/c. Would check O2 sats on ambulation closer to discharge   COPD exacerbation -Patient started on IV Solu-Medrol  -Continue DuoNebs as tolerated -no audible wheezing at this time   Left lower lobe pneumonia, postobstructive -Chest x-ray reviewed, suggestive of left-sided pneumonia, likely postobstructive - continue with abx per above. D/c vanc as MRSA swab is neg   History of chronic heart failure with reduced EF -Patient given trial 1 dose of IV Lasix  in the emergency department -Renal function remains stable   Stage IIIa non-small cell lung cancer -Followed by oncology and radiation oncology, recently completed treatment -Have included Oncology and  Rad Onc with treatment team   Diabetes mellitus type 2 on insulin  -Continue sliding scale insulin  as needed -Glucose currently suboptimally controlled with concurrent steroids -Increase semglee  to 28u   Hypertension -Blood pressure currently stable and controlled -Continue current regimen   CAD -Seems stable at this time.  Continue aspirin  and statin   History of renal transplant -Patient status post renal transplant at [facility] in 2019 -Renal function thus far stable -Continue tacrolimus  and Rapamune  -Renal function stable   Hyponatremia -Worsened today -Will check serum osm, urine osm, urine Na -recheck bmet in AM    Subjective: Reports feeling somewhat better today. Still requiring O2  Physical Exam: Vitals:   11/20/23 0132 11/20/23 0608 11/20/23 0842 11/20/23 1645  BP: 122/60 135/70  136/64  Pulse: 91 99  96  Resp: 16 16  18   Temp: 97.9 F (36.6 C) 98.4 F (36.9 C)  97.8 F (36.6 C)  TempSrc:    Oral  SpO2: 92% 95% 95% 93%  Weight:       General exam: Awake, laying in bed, in nad Respiratory system: Normal respiratory effort, no audible wheezing Cardiovascular system: regular rate, s1, s2 Gastrointestinal system: Soft, nondistended, positive BS Central nervous system: CN2-12 grossly intact, strength intact Extremities: Perfused, no clubbing Skin: Normal skin turgor, no notable skin lesions seen Psychiatry: Mood normal // no visual hallucinations   Data Reviewed:  Labs reviewed: Na 126, K 3.5, Cr 0.75, WBC 8.8, Hgb 9.1, Plts 164  Family Communication: Pt in room, family at bedside  Disposition: Status is: Observation The patient will require care spanning > 2 midnights and should be moved to inpatient because: severity of illness  Planned Discharge Destination: Home    Author:  Garnette Pelt, MD 11/20/2023 6:12 PM  For on call review [email or web address].

## 2023-11-21 DIAGNOSIS — J9601 Acute respiratory failure with hypoxia: Secondary | ICD-10-CM | POA: Diagnosis not present

## 2023-11-21 DIAGNOSIS — J189 Pneumonia, unspecified organism: Secondary | ICD-10-CM | POA: Diagnosis not present

## 2023-11-21 DIAGNOSIS — J441 Chronic obstructive pulmonary disease with (acute) exacerbation: Secondary | ICD-10-CM | POA: Diagnosis not present

## 2023-11-21 LAB — COMPREHENSIVE METABOLIC PANEL WITH GFR
ALT: 29 U/L (ref 0–44)
AST: 18 U/L (ref 15–41)
Albumin: 2.3 g/dL — ABNORMAL LOW (ref 3.5–5.0)
Alkaline Phosphatase: 80 U/L (ref 38–126)
Anion gap: 10 (ref 5–15)
BUN: 28 mg/dL — ABNORMAL HIGH (ref 8–23)
CO2: 28 mmol/L (ref 22–32)
Calcium: 8.8 mg/dL — ABNORMAL LOW (ref 8.9–10.3)
Chloride: 96 mmol/L — ABNORMAL LOW (ref 98–111)
Creatinine, Ser: 0.65 mg/dL (ref 0.44–1.00)
GFR, Estimated: >60 mL/min (ref >60)
Glucose, Bld: 225 mg/dL — ABNORMAL HIGH (ref 70–99)
Potassium: 3.7 mmol/L (ref 3.5–5.1)
Sodium: 134 mmol/L — ABNORMAL LOW (ref 135–145)
Total Bilirubin: 0.6 mg/dL (ref 0.0–1.2)
Total Protein: 6 g/dL — ABNORMAL LOW (ref 6.5–8.1)

## 2023-11-21 LAB — GLUCOSE, CAPILLARY
Glucose-Capillary: 285 mg/dL — ABNORMAL HIGH (ref 70–99)
Glucose-Capillary: 435 mg/dL — ABNORMAL HIGH (ref 70–99)
Glucose-Capillary: 465 mg/dL — ABNORMAL HIGH (ref 70–99)
Glucose-Capillary: 440 mg/dL — ABNORMAL HIGH (ref 70–99)
Glucose-Capillary: 363 mg/dL — ABNORMAL HIGH (ref 70–99)
Glucose-Capillary: 332 mg/dL — ABNORMAL HIGH (ref 70–99)
Glucose-Capillary: 244 mg/dL — ABNORMAL HIGH (ref 70–99)

## 2023-11-21 LAB — CBC
HCT: 29.3 % — ABNORMAL LOW (ref 36.0–46.0)
Hemoglobin: 9.1 g/dL — ABNORMAL LOW (ref 12.0–15.0)
MCH: 27 pg (ref 26.0–34.0)
MCHC: 31.1 g/dL (ref 30.0–36.0)
MCV: 86.9 fL (ref 80.0–100.0)
Platelets: 154 K/uL (ref 150–400)
RBC: 3.37 MIL/uL — ABNORMAL LOW (ref 3.87–5.11)
RDW: 14.4 % (ref 11.5–15.5)
WBC: 8.5 K/uL (ref 4.0–10.5)
nRBC: 0.4 % — ABNORMAL HIGH (ref 0.0–0.2)

## 2023-11-21 MED ORDER — INSULIN ASPART 100 UNIT/ML IJ SOLN
5.0000 [IU] | Freq: Once | INTRAMUSCULAR | Status: AC
  Administered 2023-11-21: 5 [IU] via SUBCUTANEOUS

## 2023-11-21 MED ORDER — MELATONIN 5 MG PO TABS
5.0000 mg | ORAL_TABLET | Freq: Every evening | ORAL | Status: AC | PRN
  Administered 2023-11-21 – 2023-11-24 (×3): 5 mg via ORAL
  Filled 2023-11-21 (×3): qty 1

## 2023-11-21 MED ORDER — INSULIN ASPART 100 UNIT/ML IJ SOLN
0.0000 [IU] | Freq: Every day | INTRAMUSCULAR | Status: DC
  Administered 2023-11-21 – 2023-11-22 (×2): 2 [IU] via SUBCUTANEOUS
  Administered 2023-11-23: 3 [IU] via SUBCUTANEOUS
  Administered 2023-11-24 – 2023-11-26 (×2): 2 [IU] via SUBCUTANEOUS
  Administered 2023-11-28 – 2023-11-30 (×3): 3 [IU] via SUBCUTANEOUS
  Administered 2023-12-01: 2 [IU] via SUBCUTANEOUS

## 2023-11-21 MED ORDER — INSULIN ASPART 100 UNIT/ML IJ SOLN
20.0000 [IU] | Freq: Once | INTRAMUSCULAR | Status: AC
  Administered 2023-11-21: 20 [IU] via SUBCUTANEOUS

## 2023-11-21 MED ORDER — INSULIN ASPART 100 UNIT/ML IJ SOLN
10.0000 [IU] | Freq: Three times a day (TID) | INTRAMUSCULAR | Status: DC
  Administered 2023-11-22 – 2023-11-24 (×8): 10 [IU] via SUBCUTANEOUS

## 2023-11-21 MED ORDER — INSULIN GLARGINE-YFGN 100 UNIT/ML ~~LOC~~ SOLN
35.0000 [IU] | Freq: Every day | SUBCUTANEOUS | Status: DC
  Filled 2023-11-21: qty 0.35

## 2023-11-21 MED ORDER — METHYLPREDNISOLONE SODIUM SUCC 40 MG IJ SOLR
40.0000 mg | Freq: Two times a day (BID) | INTRAMUSCULAR | Status: DC
  Administered 2023-11-21 – 2023-11-26 (×10): 40 mg via INTRAVENOUS
  Filled 2023-11-21 (×10): qty 1

## 2023-11-21 MED ORDER — INSULIN ASPART 100 UNIT/ML IJ SOLN
0.0000 [IU] | Freq: Three times a day (TID) | INTRAMUSCULAR | Status: DC
  Administered 2023-11-21: 20 [IU] via SUBCUTANEOUS
  Administered 2023-11-22: 3 [IU] via SUBCUTANEOUS
  Administered 2023-11-22: 7 [IU] via SUBCUTANEOUS
  Administered 2023-11-22: 20 [IU] via SUBCUTANEOUS
  Administered 2023-11-23: 4 [IU] via SUBCUTANEOUS
  Administered 2023-11-23: 11 [IU] via SUBCUTANEOUS
  Administered 2023-11-23: 15 [IU] via SUBCUTANEOUS
  Administered 2023-11-24 (×2): 4 [IU] via SUBCUTANEOUS
  Administered 2023-11-24: 15 [IU] via SUBCUTANEOUS
  Administered 2023-11-25: 11 [IU] via SUBCUTANEOUS
  Administered 2023-11-25: 4 [IU] via SUBCUTANEOUS
  Administered 2023-11-26: 3 [IU] via SUBCUTANEOUS
  Administered 2023-11-26 (×2): 7 [IU] via SUBCUTANEOUS
  Administered 2023-11-27 (×3): 3 [IU] via SUBCUTANEOUS
  Administered 2023-11-28: 7 [IU] via SUBCUTANEOUS
  Administered 2023-11-28: 3 [IU] via SUBCUTANEOUS
  Administered 2023-11-28: 11 [IU] via SUBCUTANEOUS
  Administered 2023-11-29: 15 [IU] via SUBCUTANEOUS
  Administered 2023-11-29: 7 [IU] via SUBCUTANEOUS
  Administered 2023-11-30: 4 [IU] via SUBCUTANEOUS
  Administered 2023-11-30: 11 [IU] via SUBCUTANEOUS
  Administered 2023-12-01: 7 [IU] via SUBCUTANEOUS
  Administered 2023-12-01: 11 [IU] via SUBCUTANEOUS
  Administered 2023-12-02: 3 [IU] via SUBCUTANEOUS
  Administered 2023-12-02: 11 [IU] via SUBCUTANEOUS

## 2023-11-21 NOTE — Progress Notes (Signed)
 Progress Note   Patient: Pamela [name] FMW:[id number] DOB: 01/11/1951 DOA: 11/19/2023     1 DOS: the patient was seen and examined on 11/21/2023   Brief hospital course: 73 year old female with history of lung cancer recently completing chemotherapy, COPD, hyperlipidemia, hypertension, diabetes, CAD, history of renal transplant at [facility] in 2019, history of heart failure with reduced EF who presented to the emergency department with complaints of shortness of breath with wheezing patient reported no significant Brummitt despite neb treatments. In the emergency department, chest x-ray was notable for a cavitary mass that correspond to recent CT chest with large left lower airspace opacity consistent with postobstructive pneumonia. Patient also noted to have an elevated BNP of 698. Patient was started on empiric antibiotics and 1 dose of IV Lasix . Hospitalist consulted for consideration for medical admission   Assessment and Plan: Acute hypoxemic respiratory failure -Chest imaging concerning for pneumonia. - Will continue cefepime . MRSA swab neg, thus vanc was discontinued -Patient is status post 1 dose of IV Lasix  in the emergency department - Continue supplemental O2 as needed, wean as tolerated.  Patient is O2 nave. May need O2 at time of d/c. Would check O2 sats on ambulation closer to discharge -Increased steroid to IV BID, continue neb tx as needed   COPD exacerbation -Continue DuoNebs as tolerated -no audible wheezing at this time -Increase solumedrol to 40mg  IV BID   Left lower lobe pneumonia, postobstructive -Chest x-ray reviewed, suggestive of left-sided pneumonia, likely postobstructive - continue with abx per above. Vanc was d/c'd as MRSA swab is neg   History of chronic heart failure with reduced EF -Patient given trial 1 dose of IV Lasix  in the emergency department -Renal function remains stable   Stage IIIa non-small cell lung cancer -Followed by oncology and radiation  oncology, recently completed treatment -Have included Oncology and Rad Onc with treatment team   Diabetes mellitus type 2 on insulin  -Continue sliding scale insulin  as needed -Glucose remains suboptimally controlled, likely made worse with IV steroids -increase semglee  to 35u and add 10u meal coverage -Increased SSI to resistant scale   Hypertension -Blood pressure currently stable and controlled -Continue current regimen   CAD -Seems stable at this time.  Continue aspirin  and statin   History of renal transplant -Patient status post renal transplant at [facility] in 2019 -Renal function thus far stable -Continue tacrolimus  and Rapamune  -Renal function stable   Hyponatremia -now improved     Subjective: Reported sob this AM  Physical Exam: Vitals:   11/21/23 0611 11/21/23 1207 11/21/23 1516 11/21/23 1645  BP:  136/65    Pulse:  (!) 101    Resp:    16  Temp:  (!) 97.3 F (36.3 C)    TempSrc:      SpO2: 92% 95% 92%   Weight: 72.6 kg      General exam: Conversant, in no acute distress Respiratory system: normal chest rise, clear, no audible wheezing Cardiovascular system: regular rhythm, s1-s2 Gastrointestinal system: Nondistended, nontender, pos BS Central nervous system: No seizures, no tremors Extremities: No cyanosis, no joint deformities Skin: No rashes, no pallor Psychiatry: Affect normal // no auditory hallucinations   Data Reviewed:  Labs reviewed: Na 134, K 3.7, Cr 0.65, WBC 8.5, Hgb 9.1, Plts 154  Family Communication: Pt in room, family at bedside  Disposition: Status is: inpatient Continue inpatient stay because: severity of illness  Planned Discharge Destination: Home    Author: Garnette Pelt, MD 11/21/2023 6:51 PM  For on  call review [email or web address].

## 2023-11-21 NOTE — TOC Progression Note (Signed)
 Transition of Care [facility]) - Progression Note    Patient Details  Name: Pamela [name] MRN: [id number] Date of Birth: 1951-04-19  Transition of Care [facility]) CM/SW Contact  Sonda Manuella Quill, RN Phone Number: 11/21/2023, 2:36 PM  Clinical Narrative:    Orders received for HHPT/OT; spoke w/ pt and [name] in room; [name]; TOC will follow.   Expected Discharge Plan: Home/Self Care Barriers to Discharge: Continued Medical Work up  Expected Discharge Plan and Services   Discharge Planning Services: CM Consult   Living arrangements for the past 2 months: Single Family Home                                       Social Determinants of Health (SDOH) Interventions SDOH Screenings   Food Insecurity: No Food Insecurity (11/20/2023)  Housing: Unknown (11/20/2023)  Transportation Needs: No Transportation Needs (11/20/2023)  Utilities: Not At Risk (11/20/2023)  Depression (PHQ2-9): Low Risk  (09/08/2023)  Social Connections: Socially Integrated (11/19/2023)  Tobacco Use: Medium Risk (11/13/2023)   Received from [facility]    Readmission Risk Interventions     No data to display

## 2023-11-21 NOTE — Plan of Care (Signed)

## 2023-11-21 NOTE — Evaluation (Signed)
 Physical Therapy Evaluation Patient Details Name: Pamela [name] MRN: [id number] DOB: [birth year] Today's Date: 11/21/2023  History of Present Illness  Pamela [name] is a 73 y.o. female admitted with acute respiratory failure with hypoxia. PMH: left lung cancer recently completed chemotherapy mid June 2025, COPD, HTN, dyslipidemia, diabetes mellitus, CAD, anemia, renal transplant followed at [facility], [facility]  Clinical Impression  Pt admitted with above diagnosis. Pt from home with spouse in single level home, ind at baseline without AD, but over the last week using RW to mobilize around the home for limited distances. On eval, pt appears SOB and anxious upon therapist's arrival, on 3L with SpO2 89%, cued for pursed lip breathing and improves to 92%. Pt completes bed mobility and step pivot transfer to recliner at bedside with RW, CGA, therapist managing lines, cues for pursed lip breathing and relaxation. Pt appears anxious and 3/4 dyspnea with mobility, increased to 4L and SpO2 90-94% with mobility; pt with improved mood when able to visualize SPO2 and HR readings -SpO2 94% and HR 103 while up in recliner at EOS. Recommend HHPT at d/c with family support. Notified RN of SpO2, O2 and anxiousness/dyspnea. Pt currently with functional limitations due to the deficits listed below (see PT Problem List). Pt will benefit from acute skilled PT to increase their independence and safety with mobility to allow discharge.           If plan is discharge home, recommend the following: A little help with walking and/or transfers;A little help with bathing/dressing/bathroom;Assistance with cooking/housework;Assist for transportation;Help with stairs or ramp for entrance   Can travel by private vehicle        Equipment Recommendations None recommended by PT  Recommendations for Other Services       Functional Status Assessment Patient has had a recent decline in their functional status and demonstrates the  ability to make significant improvements in function in a reasonable and predictable amount of time.     Precautions / Restrictions Precautions Precautions: Fall Precaution/Restrictions Comments: monitor O2 Restrictions Weight Bearing Restrictions Per Provider Order: No      Mobility  Bed Mobility Overal bed mobility: Needs Assistance Bed Mobility: Supine to Sit     Supine to sit: Contact guard, Used rails     General bed mobility comments: slow to mobilize, strong use of bedrail, increased time and effort; appears anxious once sitting EOB, focused on Spo2 on machine improves mood    Transfers Overall transfer level: Needs assistance Equipment used: Rolling walker (2 wheels) Transfers: Sit to/from Stand, Bed to chair/wheelchair/BSC Sit to Stand: Contact guard assist   Step pivot transfers: Contact guard assist       General transfer comment: verbal cues for hand placement, therapist managing O2 tubing, able to clear each foot, appers anxious wtih fast breathing, cued for relaxation techniques    Ambulation/Gait               General Gait Details: not attempted- dyspnea with transfer and pt reports hard work of breathing  Stairs            Wheelchair Mobility     Tilt Bed    Modified Rankin (Stroke Patients Only)       Balance Overall balance assessment: Needs assistance Sitting-balance support: Feet supported Sitting balance-Leahy Scale: Good     Standing balance support: Reliant on assistive device for balance, During functional activity, Bilateral upper extremity supported Standing balance-Leahy Scale: Poor  Pertinent Vitals/Pain Pain Assessment Pain Assessment: No/denies pain    Home Living Family/patient expects to be discharged to:: Private residence Living Arrangements: Spouse/significant other Available Help at Discharge: Family;Available 24 hours/day Type of Home: House Home Access:  Stairs to enter Entrance Stairs-Rails: Doctor, general practice of Steps: 2   Home Layout: One level Home Equipment: Agricultural consultant (2 wheels);Cane - single point      Prior Function Prior Level of Function : Independent/Modified Independent             Mobility Comments: pt reports ind at baseline, has been using RW for the last week due to sickness ADLs Comments: pt reports ind at baseline with self care and household chores     Extremity/Trunk Assessment   Upper Extremity Assessment Upper Extremity Assessment: Defer to OT evaluation    Lower Extremity Assessment Lower Extremity Assessment: Overall WFL for tasks assessed (AROM WFL, strength grossly 3+/5, denies numbness/tingling)    Cervical / Trunk Assessment Cervical / Trunk Assessment: Normal  Communication   Communication Communication: No apparent difficulties    Cognition Arousal: Alert Behavior During Therapy: Anxious   PT - Cognitive impairments: No apparent impairments                       PT - Cognition Comments: pt appears anxious, cued for slow deep breaths and relaxation techniques, improves when able to see SpO2 and HR on vitals machine Following commands: Intact       Cueing       General Comments General comments (skin integrity, edema, etc.): Pt on 3L upon arrival, dyspnea 3/4, SPO2 89% while supine in bed, cued for pursed lip breathing and improves to 91% but pt reports SOB and appers dyspnic. Transfer to EOB on 3L and Spo2 89%, 3/4 dyspnea, increased to 4L and SpO2 improves to 94% with seated rest break; on 4L pt's SpO2 90% post transfer to recliner at bedside, improves to 94% with ~5 min seated rest break and pursed lip breathing cues    Exercises     Assessment/Plan    PT Assessment Patient needs continued PT services  PT Problem List Decreased activity tolerance;Decreased balance;Decreased knowledge of precautions;Cardiopulmonary status limiting activity       PT  Treatment Interventions DME instruction;Gait training;Stair training;Functional mobility training;Therapeutic activities;Therapeutic exercise;Balance training;Patient/family education    PT Goals (Current goals can be found in the Care Plan section)  Acute Rehab PT Goals Patient Stated Goal: breath better PT Goal Formulation: With patient/family Time For Goal Achievement: 12/05/23 Potential to Achieve Goals: Good    Frequency Min 3X/week     Co-evaluation               AM-PAC PT 6 Clicks Mobility  Outcome Measure Help needed turning from your back to your side while in a flat bed without using bedrails?: A Little Help needed moving from lying on your back to sitting on the side of a flat bed without using bedrails?: A Little Help needed moving to and from a bed to a chair (including a wheelchair)?: A Little Help needed standing up from a chair using your arms (e.g., wheelchair or bedside chair)?: A Little Help needed to walk in hospital room?: A Lot Help needed climbing 3-5 steps with a railing? : A Lot 6 Click Score: 16    End of Session Equipment Utilized During Treatment: Gait belt;Oxygen Activity Tolerance: Patient tolerated treatment well;Patient limited by fatigue Patient left: in chair;with call bell/phone  within reach Nurse Communication: Mobility status;Other (comment) (anxiousness, SpO2, O2) PT Visit Diagnosis: Other abnormalities of gait and mobility (R26.89);Difficulty in walking, not elsewhere classified (R26.2)    Time: 9077-9051 PT Time Calculation (min) (ACUTE ONLY): 26 min   Charges:   PT Evaluation $PT Eval Low Complexity: 1 Low PT Treatments $Therapeutic Activity: 8-22 mins PT General Charges $$ ACUTE PT VISIT: 1 Visit         Tori Jaimie Redditt PT, DPT 11/21/23, 10:01 AM

## 2023-11-21 NOTE — Evaluation (Signed)
 Occupational Therapy Evaluation Patient Details Name: Pamela [name] MRN: [id number] DOB: 04-18-1951 Today's Date: 11/21/2023   History of Present Illness   [name] a 73 yr old [name] admitted with DOE and was found to have COPD exacerbation and PNA. PMH: left lung cancer recently completed chemotherapy mid June 2025, COPD, HTN, dyslipidemia, diabetes mellitus, CAD, anemia, renal transplant followed at [facility], [facility]     Clinical Impressions The pt Pamela currently presenting below her baseline level of functioning for self-care management, as she Pamela limited by the below listed deficits (see OT problem list). At her baseline, she does not use oxygen, however she Pamela currently requiring 4L O2 via nasal cannula. Her O2 saturation was noted to be 87% on 4L O2 with activity, and 93% on 6L O2 with activity. She reported feelings of increased shortness of breath and compromised activity tolerance. She will benefit from further OT services to maximize her independence with self care tasks and to decrease the risk for restricted participation in meaningful activities.      If plan Pamela discharge home, recommend the following:   Assistance with cooking/housework;Help with stairs or ramp for entrance     Functional Status Assessment   Patient has had a recent decline in their functional status and demonstrates the ability to make significant improvements in function in a reasonable and predictable amount of time.     Equipment Recommendations   None recommended by OT     Recommendations for Other Services         Precautions/Restrictions   Precautions Precautions: Fall Precaution/Restrictions Comments: monitor O2 Restrictions Weight Bearing Restrictions Per Provider Order: No     Mobility Bed Mobility               General bed mobility comments: pt was received setaed in the chair    Transfers Overall transfer level: Needs assistance Equipment used: Rolling  walker (2 wheels) Transfers: Sit to/from Stand Sit to Stand: Contact guard assist                  Balance     Sitting balance-Leahy Scale: Good         Standing balance comment: CGA with RW         ADL either performed or assessed with clinical judgement   ADL Overall ADL's : Needs assistance/impaired Eating/Feeding: Independent;Sitting   Grooming: Set up;Sitting           Upper Body Dressing : Set up;Sitting   Lower Body Dressing: Contact guard assist;Sit to/from stand   Toilet Transfer: Contact guard assist;BSC/3in1;Rolling walker (2 wheels);Stand-pivot Statistician Details (indicate cue type and reason): based on clinical judgement Toileting- Clothing Manipulation and Hygiene: Contact guard assist;Sit to/from stand Toileting - Clothing Manipulation Details (indicate cue type and reason): at bedside commode level, based on clinical judgement             Vision   Additional Comments: She correctly read the time depicted on the wall clock.            Pertinent Vitals/Pain Pain Assessment Pain Assessment: No/denies pain     Extremity/Trunk Assessment Upper Extremity Assessment Upper Extremity Assessment: Overall WFL for tasks assessed;RUE deficits/detail;Right hand dominant;LUE deficits/detail RUE Deficits / Details: AROM WFL. Grip strength 4/5 LUE Deficits / Details: AROM WFL. Grip strength 4/5   Lower Extremity Assessment Lower Extremity Assessment: Overall WFL for tasks assessed;LLE deficits/detail;RLE deficits/detail RLE Deficits / Details: AROM WFL LLE Deficits / Details: AROM  [facility]      Communication Communication Communication: No apparent difficulties   Cognition Arousal: Alert Behavior During Therapy: WFL for tasks assessed/performed Cognition: No apparent impairments             OT - Cognition Comments: Oriented x4                 Following commands: Intact                  Home Living Family/patient  expects to be discharged to:: Private residence Living Arrangements: Spouse/significant other Available Help at Discharge: Family Type of Home: House Home Access: Stairs to enter Secretary/administrator of Steps: 2 Entrance Stairs-Rails: Right;Left Home Layout: One level     Bathroom Shower/Tub: Tub/shower unit         Home Equipment: Agricultural consultant (2 wheels);Cane - single point;Shower seat;BSC/3in1;Wheelchair - manual          Prior Functioning/Environment Prior Level of Function : Independent/Modified Independent;Driving             Mobility Comments:  (Independent with ambulation.) ADLs Comments:  (She was modified independent to independent with ADLs, cooking, cleaning, and driving.)    OT Problem List: Decreased strength;Decreased activity tolerance;Cardiopulmonary status limiting activity   OT Treatment/Interventions: Self-care/ADL training;Therapeutic exercise;Energy conservation;Therapeutic activities;DME and/or AE instruction;Balance training      OT Goals(Current goals can be found in the care plan section)   Acute Rehab OT Goals Patient Stated Goal: to get better OT Goal Formulation: With patient Time For Goal Achievement: 12/05/23 Potential to Achieve Goals: Good ADL Goals Pt Will Perform Grooming: with modified independence;standing Pt Will Perform Lower Body Dressing: with modified independence;sit to/from stand Pt Will Transfer to Toilet: with modified independence;ambulating Pt Will Perform Toileting - Clothing Manipulation and hygiene: with modified independence;sit to/from stand   OT Frequency:  Min 2X/week       AM-PAC OT 6 Clicks Daily Activity     Outcome Measure Help from another person eating meals?: None Help from another person taking care of personal grooming?: None Help from another person toileting, which includes using toliet, bedpan, or urinal?: A Little Help from another person bathing (including washing, rinsing, drying)?:  A Little Help from another person to put on and taking off regular upper body clothing?: A Little Help from another person to put on and taking off regular lower body clothing?: A Little 6 Click Score: 20   End of Session Equipment Utilized During Treatment: Rolling walker (2 wheels);Oxygen Nurse Communication: Other (comment) (pt's O2 saturation)  Activity Tolerance: Other (comment) (Limited by compromised endurance) Patient left: in chair;with call bell/phone within reach;with family/visitor present  OT Visit Diagnosis: Muscle weakness (generalized) (M62.81);Unsteadiness on feet (R26.81)                Time: 8879-8864 OT Time Calculation (min): 15 min Charges:  OT General Charges $OT Visit: 1 Visit OT Evaluation $OT Eval Moderate Complexity: 1 Mod    Bridgit Eynon L Elanor Cale, OTR/L 11/21/2023, 1:12 PM

## 2023-11-22 DIAGNOSIS — J189 Pneumonia, unspecified organism: Secondary | ICD-10-CM | POA: Diagnosis not present

## 2023-11-22 DIAGNOSIS — J9601 Acute respiratory failure with hypoxia: Secondary | ICD-10-CM | POA: Diagnosis not present

## 2023-11-22 DIAGNOSIS — J441 Chronic obstructive pulmonary disease with (acute) exacerbation: Secondary | ICD-10-CM | POA: Diagnosis not present

## 2023-11-22 LAB — COMPREHENSIVE METABOLIC PANEL WITH GFR
ALT: 27 U/L (ref 0–44)
AST: 18 U/L (ref 15–41)
Albumin: 2.3 g/dL — ABNORMAL LOW (ref 3.5–5.0)
Alkaline Phosphatase: 82 U/L (ref 38–126)
Anion gap: 9 (ref 5–15)
BUN: 28 mg/dL — ABNORMAL HIGH (ref 8–23)
CO2: 28 mmol/L (ref 22–32)
Calcium: 8.7 mg/dL — ABNORMAL LOW (ref 8.9–10.3)
Chloride: 98 mmol/L (ref 98–111)
Creatinine, Ser: 0.62 mg/dL (ref 0.44–1.00)
GFR, Estimated: >60 mL/min (ref >60)
Glucose, Bld: 128 mg/dL — ABNORMAL HIGH (ref 70–99)
Potassium: 4.3 mmol/L (ref 3.5–5.1)
Sodium: 135 mmol/L (ref 135–145)
Total Bilirubin: 0.5 mg/dL (ref 0.0–1.2)
Total Protein: 5.7 g/dL — ABNORMAL LOW (ref 6.5–8.1)

## 2023-11-22 LAB — CBC
HCT: 28.9 % — ABNORMAL LOW (ref 36.0–46.0)
Hemoglobin: 9 g/dL — ABNORMAL LOW (ref 12.0–15.0)
MCH: 26.8 pg (ref 26.0–34.0)
MCHC: 31.1 g/dL (ref 30.0–36.0)
MCV: 86 fL (ref 80.0–100.0)
Platelets: 145 K/uL — ABNORMAL LOW (ref 150–400)
RBC: 3.36 MIL/uL — ABNORMAL LOW (ref 3.87–5.11)
RDW: 14.6 % (ref 11.5–15.5)
WBC: 6.6 K/uL (ref 4.0–10.5)
nRBC: 0.5 % — ABNORMAL HIGH (ref 0.0–0.2)

## 2023-11-22 LAB — GLUCOSE, CAPILLARY
Glucose-Capillary: 150 mg/dL — ABNORMAL HIGH (ref 70–99)
Glucose-Capillary: 351 mg/dL — ABNORMAL HIGH (ref 70–99)
Glucose-Capillary: 230 mg/dL — ABNORMAL HIGH (ref 70–99)
Glucose-Capillary: 217 mg/dL — ABNORMAL HIGH (ref 70–99)

## 2023-11-22 MED ORDER — INSULIN GLARGINE-YFGN 100 UNIT/ML ~~LOC~~ SOLN: 30.0000 [IU] | Freq: Every day | SUBCUTANEOUS | Status: DC

## 2023-11-22 MED ORDER — INSULIN GLARGINE-YFGN 100 UNIT/ML ~~LOC~~ SOLN
30.0000 [IU] | Freq: Every day | SUBCUTANEOUS | Status: DC
  Administered 2023-11-22 – 2023-11-24 (×3): 30 [IU] via SUBCUTANEOUS
  Filled 2023-11-22 (×3): qty 0.3

## 2023-11-22 NOTE — Progress Notes (Signed)
 Progress Note   Patient: Pamela [name] FMW:[id number] DOB: June 02, 1950 DOA: 11/19/2023     2 DOS: the patient was seen and examined on 11/22/2023   Brief hospital course: 73 year old female with history of lung cancer recently completing chemotherapy, COPD, hyperlipidemia, hypertension, diabetes, CAD, history of renal transplant at [facility] in 2019, history of heart failure with reduced EF who presented to the emergency department with complaints of shortness of breath with wheezing patient reported no significant Brummitt despite neb treatments. In the emergency department, chest x-ray was notable for a cavitary mass that correspond to recent CT chest with large left lower airspace opacity consistent with postobstructive pneumonia. Patient also noted to have an elevated BNP of 698. Patient was started on empiric antibiotics and 1 dose of IV Lasix . Hospitalist consulted for consideration for medical admission   Assessment and Plan: Acute hypoxemic respiratory failure -Chest imaging concerning for pneumonia, however recent chest CT has findings c/w radiation changes. - Will continue cefepime  for now, anticipate completing 5 days of empiric abx -Patient is status post 1 dose of IV Lasix  in the emergency department - Continue supplemental O2 as needed, wean as tolerated.  Patient is O2 nave. May need O2 at time of d/c. Would check O2 sats on ambulation closer to discharge -Greater concern for radiation changes/pneumonitis based on recent CT following 7 weeks of chemoradiation.  -Continued on BID IV solumedrol   COPD exacerbation -Continue DuoNebs as tolerated -no audible wheezing at this time -continued on IV solumedrol BID per above   Left lower lobe pneumonia, postobstructive -Chest x-ray reviewed, suggestive of left-sided pneumonia, likely postobstructive - continue with abx per above. Vanc was d/c'd as MRSA swab is neg   History of chronic heart failure with reduced EF -Patient given trial  1 dose of IV Lasix  in the emergency department -Renal function remains stable   Stage IIIa non-small cell lung cancer -Followed by oncology and radiation oncology, recently completed treatment -Have included Oncology and Rad Onc with treatment team   Diabetes mellitus type 2 on insulin  -Continue sliding scale insulin  as needed -Glucose remains suboptimally controlled, likely made worse with IV steroids -increase semglee  to 35u and add 10u meal coverage -Increased SSI to resistant scale   Hypertension -Blood pressure currently stable and controlled -Continue current regimen   CAD -Seems stable at this time.  Continue aspirin  and statin   History of renal transplant -Patient status post renal transplant at [facility] in 2019 -Renal function thus far stable -Continue tacrolimus  and Rapamune  -Renal function stable   Hyponatremia -now improved     Subjective: Still complaining of sob with mild exertion  Physical Exam: Vitals:   11/22/23 0741 11/22/23 0958 11/22/23 1301 11/22/23 1316  BP:  (!) 146/73 (!) 147/69   Pulse:   (!) 109   Resp:      Temp:   97.8 F (36.6 C)   TempSrc:      SpO2: 97%  94% 95%  Weight:       General exam: Awake, laying in bed, in nad Respiratory system: Increased respiratory effort, no wheezing Cardiovascular system: regular rate, s1, s2 Gastrointestinal system: Soft, nondistended, positive BS Central nervous system: CN2-12 grossly intact, strength intact Extremities: Perfused, no clubbing Skin: Normal skin turgor, no notable skin lesions seen Psychiatry: Mood normal // no visual hallucinations   Data Reviewed:  Labs reviewed: Na 134, K 3.7, Cr 0.65, WBC 8.5, Hgb 9.1, Plts 154  Family Communication: Pt in room, family at bedside  Disposition:  Status is: inpatient Continue inpatient stay because: severity of illness  Planned Discharge Destination: Home    Author: Garnette Pelt, MD 11/22/2023 5:29 PM  For on call review [email or web address].

## 2023-11-22 NOTE — Plan of Care (Signed)

## 2023-11-22 NOTE — Plan of Care (Signed)
  Problem: Education: Goal: Ability to describe self-care measures that may prevent or decrease complications (Diabetes Survival Skills Education) will improve Outcome: Progressing   Problem: Coping: Goal: Ability to adjust to condition or change in health will improve Outcome: Progressing   Problem: Metabolic: Goal: Ability to maintain appropriate glucose levels will improve Outcome: Progressing   Problem: Tissue Perfusion: Goal: Adequacy of tissue perfusion will improve Outcome: Progressing   Problem: Education: Goal: Knowledge of General Education information will improve Description: Including pain rating scale, medication(s)/side effects and non-pharmacologic comfort measures Outcome: Progressing   Problem: Health Behavior/Discharge Planning: Goal: Ability to manage health-related needs will improve Outcome: Progressing

## 2023-11-23 DIAGNOSIS — J441 Chronic obstructive pulmonary disease with (acute) exacerbation: Secondary | ICD-10-CM | POA: Diagnosis not present

## 2023-11-23 DIAGNOSIS — J9601 Acute respiratory failure with hypoxia: Secondary | ICD-10-CM | POA: Diagnosis not present

## 2023-11-23 DIAGNOSIS — J189 Pneumonia, unspecified organism: Secondary | ICD-10-CM | POA: Diagnosis not present

## 2023-11-23 LAB — COMPREHENSIVE METABOLIC PANEL WITH GFR
ALT: 28 U/L (ref 0–44)
AST: 25 U/L (ref 15–41)
Albumin: 2.5 g/dL — ABNORMAL LOW (ref 3.5–5.0)
Alkaline Phosphatase: 83 U/L (ref 38–126)
Anion gap: 11 (ref 5–15)
BUN: 27 mg/dL — ABNORMAL HIGH (ref 8–23)
CO2: 26 mmol/L (ref 22–32)
Calcium: 8.6 mg/dL — ABNORMAL LOW (ref 8.9–10.3)
Chloride: 98 mmol/L (ref 98–111)
Creatinine, Ser: 0.63 mg/dL (ref 0.44–1.00)
GFR, Estimated: >60 mL/min (ref >60)
Glucose, Bld: 185 mg/dL — ABNORMAL HIGH (ref 70–99)
Potassium: 4.4 mmol/L (ref 3.5–5.1)
Sodium: 135 mmol/L (ref 135–145)
Total Bilirubin: 0.5 mg/dL (ref 0.0–1.2)
Total Protein: 5.8 g/dL — ABNORMAL LOW (ref 6.5–8.1)

## 2023-11-23 LAB — GLUCOSE, CAPILLARY
Glucose-Capillary: 163 mg/dL — ABNORMAL HIGH (ref 70–99)
Glucose-Capillary: 314 mg/dL — ABNORMAL HIGH (ref 70–99)
Glucose-Capillary: 287 mg/dL — ABNORMAL HIGH (ref 70–99)
Glucose-Capillary: 290 mg/dL — ABNORMAL HIGH (ref 70–99)

## 2023-11-23 LAB — CBC
HCT: 30.5 % — ABNORMAL LOW (ref 36.0–46.0)
Hemoglobin: 9.4 g/dL — ABNORMAL LOW (ref 12.0–15.0)
MCH: 26.9 pg (ref 26.0–34.0)
MCHC: 30.8 g/dL (ref 30.0–36.0)
MCV: 87.4 fL (ref 80.0–100.0)
Platelets: 139 K/uL — ABNORMAL LOW (ref 150–400)
RBC: 3.49 MIL/uL — ABNORMAL LOW (ref 3.87–5.11)
RDW: 14.6 % (ref 11.5–15.5)
WBC: 7.4 K/uL (ref 4.0–10.5)
nRBC: 0.3 % — ABNORMAL HIGH (ref 0.0–0.2)

## 2023-11-23 NOTE — Progress Notes (Signed)
 SATURATION QUALIFICATIONS: (This note is used to comply with regulatory documentation for home oxygen)  Patient Saturations on Room Air at Rest = 86%  Patient Saturations on Room Air while Ambulating = 84%  Patient Saturations on 4 Liters of oxygen while Ambulating = 90%

## 2023-11-23 NOTE — Progress Notes (Signed)
 Mobility Specialist - Progress Note   11/23/23 1458  Oxygen Therapy  SpO2 (!) 85 %  O2 Device Nasal Cannula  O2 Flow Rate (L/min) 4 L/min  Patient Activity (if Appropriate) Other (Comment) (During xfer to recliner)  Mobility  Activity Transferred from bed to chair  Level of Assistance Standby assist, set-up cues, supervision of patient - no hands on  Assistive Device None  Distance Ambulated (ft) 2 ft  Activity Response Tolerated well  Mobility Referral Yes  Mobility visit 1 Mobility  Mobility Specialist Start Time (ACUTE ONLY) 1440  Mobility Specialist Stop Time (ACUTE ONLY) 1457  Mobility Specialist Time Calculation (min) (ACUTE ONLY) 17 min   Pt received in bed and agreeable to mobility. During transfer, pt desat to 85% on 4L with slow recovery. Bumped to 5L with RN approval allowing SpO2 to come back up to 93% (~11min). SOB throughout session. No complaints during session.  Pt to recliner after session with all needs met.    Pre-mobility: 114 HR, 94% SpO2 (4L [place]) During mobility: 133 HR, 85%  SpO2 (4L [place]) Post-mobility: 122 HR, 93% SPO2 (4L/5L [place])  Chief Technology Officer

## 2023-11-23 NOTE — Progress Notes (Signed)
   11/23/23 1427  Oxygen Therapy/Pulse Ox  SpO2 (!) (S)  87 % (Increased from 3 L to 4 L, pt prefers 4 L [place] @ this time, SOB on 3.)

## 2023-11-23 NOTE — Progress Notes (Signed)
 Progress Note   Patient: Pamela [name] FMW:[id number] DOB: July 01, 1950 DOA: 11/19/2023     3 DOS: the patient was seen and examined on 11/23/2023   Brief hospital course: 73 year old female with history of lung cancer recently completing chemotherapy, COPD, hyperlipidemia, hypertension, diabetes, CAD, history of renal transplant at [facility] in 2019, history of heart failure with reduced EF who presented to the emergency department with complaints of shortness of breath with wheezing patient reported no significant Brummitt despite neb treatments. In the emergency department, chest x-ray was notable for a cavitary mass that correspond to recent CT chest with large left lower airspace opacity consistent with postobstructive pneumonia. Patient also noted to have an elevated BNP of 698. Patient was started on empiric antibiotics and 1 dose of IV Lasix . Hospitalist consulted for consideration for medical admission   Assessment and Plan: Acute hypoxemic respiratory failure -Chest imaging concerning for pneumonia, however recent chest CT has findings c/w radiation changes. - completed 5 days of cefepime  -Patient is status post 1 dose of IV Lasix  in the emergency department - Continue supplemental O2. Thus far, unable to wean. Meets home O2 requirements -Greater concern for radiation changes/pneumonitis based on recent CT following 7 weeks of chemoradiation.  -Continued on BID IV solumedrol. Will d/w Oncology   COPD exacerbation -Continue DuoNebs as tolerated -no audible wheezing at this time -continued on IV solumedrol BID per above   Left lower lobe pneumonia, postobstructive -Chest x-ray reviewed, suggestive of left-sided pneumonia, likely postobstructive - completed 5 days of abx empirically   History of chronic heart failure with reduced EF -Patient given trial 1 dose of IV Lasix  in the emergency department -Renal function remains stable   Stage IIIa non-small cell lung cancer -Followed by  oncology and radiation oncology, recently completed treatment -Have included Oncology and Rad Onc with treatment team   Diabetes mellitus type 2 on insulin  -Continue sliding scale insulin  as needed -Glucose remains suboptimally controlled, likely made worse with IV steroids -increase semglee  to 40u and cont 10u meal coverage -Increased SSI to resistant scale   Hypertension -Blood pressure currently stable and controlled -Continue current regimen   CAD -Seems stable at this time.  Continue aspirin  and statin   History of renal transplant -Patient status post renal transplant at [facility] in 2019 -Renal function thus far stable -Continue tacrolimus  and Rapamune  -Renal function stable   Hyponatremia -now improved     Subjective: Continues to feel sob on exertion or ambulation  Physical Exam: Vitals:   11/23/23 1427 11/23/23 1458 11/23/23 1554 11/23/23 1630  BP:   (!) 145/82 (!) 142/70  Pulse:   (!) 115   Resp:      Temp:   97.8 F (36.6 C)   TempSrc:      SpO2: (S) (!) 87% (!) 85% 93%   Weight:       General exam: Conversant, in no acute distress Respiratory system: normal chest rise, clear, no audible wheezing Cardiovascular system: regular rhythm, s1-s2 Gastrointestinal system: Nondistended, nontender, pos BS Central nervous system: No seizures, no tremors Extremities: No cyanosis, no joint deformities Skin: No rashes, no pallor Psychiatry: Affect normal // no auditory hallucinations   Data Reviewed:  Labs reviewed: Na 135, K 4.4, Cr 0.63, WBC 7.4, Hgb 9.4, Plts 139  Family Communication: Pt in room, family not at bedside  Disposition: Status is: inpatient Continue inpatient stay because: severity of illness  Planned Discharge Destination: Home    Author: Garnette Pelt, MD 11/23/2023 5:15 PM  For on call review [email or web address].

## 2023-11-23 NOTE — Plan of Care (Signed)
  Problem: Education: Goal: Ability to describe self-care measures that may prevent or decrease complications (Diabetes Survival Skills Education) will improve Outcome: Progressing   Problem: Coping: Goal: Ability to adjust to condition or change in health will improve Outcome: Progressing   Problem: Fluid Volume: Goal: Ability to maintain a balanced intake and output will improve Outcome: Progressing   Problem: Nutritional: Goal: Maintenance of adequate nutrition will improve Outcome: Progressing   Problem: Skin Integrity: Goal: Risk for impaired skin integrity will decrease Outcome: Progressing

## 2023-11-24 ENCOUNTER — Inpatient Hospital Stay (HOSPITAL_COMMUNITY)

## 2023-11-24 ENCOUNTER — Inpatient Hospital Stay: Admitting: Internal Medicine

## 2023-11-24 DIAGNOSIS — J441 Chronic obstructive pulmonary disease with (acute) exacerbation: Secondary | ICD-10-CM | POA: Diagnosis not present

## 2023-11-24 DIAGNOSIS — J9601 Acute respiratory failure with hypoxia: Secondary | ICD-10-CM | POA: Diagnosis not present

## 2023-11-24 DIAGNOSIS — J189 Pneumonia, unspecified organism: Secondary | ICD-10-CM | POA: Diagnosis not present

## 2023-11-24 DIAGNOSIS — J700 Acute pulmonary manifestations due to radiation: Secondary | ICD-10-CM

## 2023-11-24 LAB — PROCALCITONIN: Procalcitonin: 0.13 ng/mL

## 2023-11-24 LAB — GLUCOSE, CAPILLARY
Glucose-Capillary: 152 mg/dL — ABNORMAL HIGH (ref 70–99)
Glucose-Capillary: 305 mg/dL — ABNORMAL HIGH (ref 70–99)
Glucose-Capillary: 190 mg/dL — ABNORMAL HIGH (ref 70–99)
Glucose-Capillary: 204 mg/dL — ABNORMAL HIGH (ref 70–99)

## 2023-11-24 LAB — RESPIRATORY PANEL BY PCR

## 2023-11-24 LAB — CBC
HCT: 30.6 % — ABNORMAL LOW (ref 36.0–46.0)
Hemoglobin: 9.5 g/dL — ABNORMAL LOW (ref 12.0–15.0)
MCH: 27 pg (ref 26.0–34.0)
MCHC: 31 g/dL (ref 30.0–36.0)
MCV: 86.9 fL (ref 80.0–100.0)
Platelets: 141 K/uL — ABNORMAL LOW (ref 150–400)
RBC: 3.52 MIL/uL — ABNORMAL LOW (ref 3.87–5.11)
RDW: 14.5 % (ref 11.5–15.5)
WBC: 7.6 K/uL (ref 4.0–10.5)
nRBC: 0.3 % — ABNORMAL HIGH (ref 0.0–0.2)

## 2023-11-24 LAB — COMPREHENSIVE METABOLIC PANEL WITH GFR
ALT: 29 U/L (ref 0–44)
AST: 26 U/L (ref 15–41)
Albumin: 2.6 g/dL — ABNORMAL LOW (ref 3.5–5.0)
Alkaline Phosphatase: 80 U/L (ref 38–126)
Anion gap: 9 (ref 5–15)
BUN: 32 mg/dL — ABNORMAL HIGH (ref 8–23)
CO2: 29 mmol/L (ref 22–32)
Calcium: 8.7 mg/dL — ABNORMAL LOW (ref 8.9–10.3)
Chloride: 99 mmol/L (ref 98–111)
Creatinine, Ser: 0.68 mg/dL (ref 0.44–1.00)
GFR, Estimated: >60 mL/min (ref >60)
Glucose, Bld: 143 mg/dL — ABNORMAL HIGH (ref 70–99)
Potassium: 4.6 mmol/L (ref 3.5–5.1)
Sodium: 137 mmol/L (ref 135–145)
Total Bilirubin: 0.8 mg/dL (ref 0.0–1.2)
Total Protein: 5.9 g/dL — ABNORMAL LOW (ref 6.5–8.1)

## 2023-11-24 LAB — LACTATE DEHYDROGENASE: LDH: 386 U/L — ABNORMAL HIGH (ref 98–192)

## 2023-11-24 MED ORDER — IPRATROPIUM-ALBUTEROL 0.5-2.5 (3) MG/3ML IN SOLN
3.0000 mL | Freq: Two times a day (BID) | RESPIRATORY_TRACT | Status: DC
  Administered 2023-11-24 – 2023-11-25 (×3): 3 mL via RESPIRATORY_TRACT
  Filled 2023-11-24 (×3): qty 3

## 2023-11-24 MED ORDER — INSULIN GLARGINE-YFGN 100 UNIT/ML ~~LOC~~ SOLN
40.0000 [IU] | Freq: Every day | SUBCUTANEOUS | Status: DC
  Administered 2023-11-25 – 2023-11-26 (×2): 40 [IU] via SUBCUTANEOUS
  Filled 2023-11-24 (×3): qty 0.4

## 2023-11-24 MED ORDER — INSULIN ASPART 100 UNIT/ML IJ SOLN
15.0000 [IU] | Freq: Three times a day (TID) | INTRAMUSCULAR | Status: DC
  Administered 2023-11-24 – 2023-11-27 (×8): 15 [IU] via SUBCUTANEOUS

## 2023-11-24 MED ORDER — CARVEDILOL 12.5 MG PO TABS
12.5000 mg | ORAL_TABLET | Freq: Two times a day (BID) | ORAL | Status: DC
  Administered 2023-11-24 – 2023-12-02 (×16): 12.5 mg via ORAL
  Filled 2023-11-24 (×16): qty 1

## 2023-11-24 MED ORDER — INSULIN GLARGINE-YFGN 100 UNIT/ML ~~LOC~~ SOPN: 10.0000 [IU] | PEN_INJECTOR | Freq: Once | SUBCUTANEOUS | Status: DC

## 2023-11-24 NOTE — Inpatient Diabetes Management (Signed)
 Inpatient Diabetes Program Recommendations  AACE/ADA: New Consensus Statement on Inpatient Glycemic Control (2015)  Target Ranges:  Prepandial:   less than 140 mg/dL      Peak postprandial:   less than 180 mg/dL (1-2 hours)      Critically ill patients:  140 - 180 mg/dL    Latest Reference Range & Units 11/23/23 07:24 11/23/23 11:22 11/23/23 16:11 11/23/23 20:44  Glucose-Capillary 70 - 99 mg/dL 836 (H)  14 units Novolog   30 units Semglee   314 (H)  25 units Novolog   287 (H)  21 units Novolog   290 (H)  3 units Novolog    (H): Data is abnormally high  Latest Reference Range & Units 11/24/23 07:41  Glucose-Capillary 70 - 99 mg/dL 847 (H)  14 units Novolog   30 units Semglee   (H): Data is abnormally high      Home DM Meds: Amaryl  2 mg BID       Humalog 8-13 units TID per SSI       Lantus  22 units daily   Current Orders: Semglee  30 units daily      Novolog  Resistant Correction Scale/ SSI (0-20 units) TID AC + HS      Novolog  10 units TID with meals      Amaryl  2 mg BID      MD- Note pt getting Solumedrol 40 mg BID  Please consider increasing Novolog  Meal Coverage to 15 units TID with meals    --Will follow patient during hospitalization--  Adina Rudolpho Arrow RN, MSN, CDCES Diabetes Coordinator Inpatient Glycemic Control Team Team Pager: [phone] (8a-5p)

## 2023-11-24 NOTE — Plan of Care (Signed)
  Problem: Education: Goal: Ability to describe self-care measures that may prevent or decrease complications (Diabetes Survival Skills Education) will improve Outcome: Progressing   Problem: Fluid Volume: Goal: Ability to maintain a balanced intake and output will improve Outcome: Progressing   Problem: Skin Integrity: Goal: Risk for impaired skin integrity will decrease Outcome: Progressing

## 2023-11-24 NOTE — Progress Notes (Signed)
  Progress Note   Patient: Pamela [name] FMW:[id number] DOB: 04-May-1951 DOA: 11/19/2023     4 DOS: the patient was seen and examined on 11/24/2023   Brief hospital course: 73 year old female with history of lung cancer recently completing chemotherapy, COPD, hyperlipidemia, hypertension, diabetes, CAD, history of renal transplant at [facility] in 2019, history of heart failure with reduced EF who presented to the emergency department with complaints of shortness of breath with wheezing patient reported no significant Brummitt despite neb treatments. In the emergency department, chest x-ray was notable for a cavitary mass that correspond to recent CT chest with large left lower airspace opacity consistent with postobstructive pneumonia. Patient also noted to have an elevated BNP of 698. Patient was started on empiric antibiotics and 1 dose of IV Lasix . 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  -Appreciate input by Pulmonary   COPD exacerbation -Continue DuoNebs as tolerated -no audible wheezing at this time -continued on IV solumedrol BID per above   Left lower lobe pneumonia, postobstructive ruled out -Initial chest x-ray was suggestive of left-sided pneumonia, likely postobstructive - completed 5 days of abx empirically   History of chronic heart failure with reduced EF -Patient given trial 1 dose of IV Lasix  in the emergency department -Renal function remains stable   Stage IIIa non-small cell lung cancer -Followed by oncology and radiation oncology, recently completed treatment -Have included Oncology and Rad Onc with treatment team   Diabetes mellitus type 2 on  insulin  -Continue sliding scale insulin  as needed -Glucose remains suboptimally controlled, likely made worse with IV steroids -increase semglee  to 30u. Increase meal coverage to 15u -Continue resistant scale SSI   Hypertension -Blood pressure currently stable and controlled -Continue current regimen   CAD -Seems stable at this time.  Continue aspirin  and statin   History of renal transplant -Patient status post renal transplant at [facility] in 2019 -Renal function thus far stable -Continue tacrolimus  and Rapamune  -Renal function stable   Hyponatremia -normalized     Subjective: Still complaining of sob  Physical Exam: Vitals:   11/24/23 0932 11/24/23 0951 11/24/23 0953 11/24/23 1339  BP: (!) 153/73 (!) 174/81 (!) 175/88 (!) 171/90  Pulse:  (!) 117 (!) 120 (!) 116  Resp:  (!) 22 (!) 26 (!) 23  Temp:  (!) 97.5 F (36.4 C)  98 F (36.7 C)  TempSrc:  Oral  Oral  SpO2:  94% 92% 96%  Weight:       General exam: Awake, laying in bed, in nad Respiratory system: Increased respiratory effort, no wheezing Cardiovascular system: regular rate, s1, s2 Gastrointestinal system: Soft, nondistended, positive BS Central nervous system: CN2-12 grossly intact, strength intact Extremities: Perfused, no clubbing Skin: Normal skin turgor, no notable skin lesions seen Psychiatry: Mood normal // no visual hallucinations   Data Reviewed:  Labs reviewed: Na 137, K 4.6, Cr 0.68, WBC 7.6, Hgb 9.5, Plts 141  Family Communication: Pt in room, family not at bedside  Disposition: Status is: inpatient Continue inpatient stay because: severity of illness  Planned Discharge Destination: Home    Author: Garnette Pelt, MD 11/24/2023 4:42 PM  For on call review [email or web address].

## 2023-11-24 NOTE — Consult Note (Signed)
 NAME:  Pamela [name], MRN:  [id number], DOB:  Oct 04, 1950, LOS: 4 ADMISSION DATE:  11/19/2023, CONSULTATION DATE:  11/24/23 REFERRING MD:  TRH CHIEF COMPLAINT:  Respiratory Failure   History of Present Illness:  Pamela [name] is a 73 year old woman with lung cancer s/p chemotherapy and radiation, COPD, hypertension, DMII, CAD and renal transplant who is admitted for acute hypoxemic respiratory failure.   CT Chest 11/17/23 showed 5.1cm x 4.5cm thick-walled cavitary mass in the superior segment left lower lobe, improved, with progressive central gas. Multifocal patchy opacities in the left hemithorax, suggestive of radiation changes. Moderate centrilobular emphysema and paraseptal emphysema with upper lung predominance.  She reported progressive dyspnea over the week before coming to the hospital after being seen by her PCP. She denied cough, fevers, chills or sweats. She denies dysphagia, but is having a harder time eating currently due to dyspnea.   Pertinent  Medical History   Past Medical History:  Diagnosis Date   Anemia    Anxiety    CHF (congestive heart failure) (HCC)    Chronic kidney disease    COPD (chronic obstructive pulmonary disease) (HCC)    Coronary artery disease    Diabetes mellitus without complication (HCC) 10/29/2012   ESRD on dialysis (HCC) 01/25/2015   Headache    Hypertension    PONV (postoperative nausea and vomiting)    Shortness of breath dyspnea    Significant Hospital Events: Including procedures, antibiotic start and stop dates in addition to other pertinent events   7/9 admitted 7/14 PCCM consult  Interim History / Subjective:  As above  Objective    Blood pressure (!) 175/88, pulse (!) 120, temperature (!) 97.5 F (36.4 C), temperature source Oral, resp. rate (!) 26, weight 72.9 kg, SpO2 92%.    FiO2 (%):  [32 %] 32 %   Intake/Output Summary (Last 24 hours) at 11/24/2023 1001 Last data filed at 11/24/2023 9360 Gross per 24 hour  Intake --   Output 850 ml  Net -850 ml   Filed Weights   11/22/23 0429 11/23/23 0429 11/24/23 0526  Weight: 73.1 kg 73.7 kg 72.9 kg    Examination: General: elderly woman, mild work of breathing, no distress HENT: [place]/AT, moist mucous membranes Lungs: diminished breath sounds, no wheezing Cardiovascular: tachycardic, no murmurs Abdomen: soft, non-tender, non-distended Extremities: warm, no edema Neuro: alert, moving all extremities GU: n/a  Labs: Procal 0.13 LDH 386  Resolved problem list   Assessment and Plan   Acute Hypoxemic Respiratory Failure 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  - LDH is elevated. Likely due to inflammation. Fungitell is pending, if elevated will empirically treat for pneumocystis pneumonia.  - s/p course of cefepime  7/13  PCCM will continue to follow   Best Practice (right click and Reselect all SmartList Selections daily)   Per primary  Labs   CBC: Recent Labs  Lab 11/20/23 0553 11/21/23 0522 11/22/23 0623 11/23/23 0759 11/24/23 0514  WBC 8.8 8.5 6.6 7.4 7.6  HGB 9.1* 9.1* 9.0* 9.4* 9.5*  HCT 28.3* 29.3* 28.9* 30.5* 30.6*  MCV 84.7 86.9 86.0 87.4 86.9  PLT 164 154 145* 139* 141*    Basic Metabolic Panel: Recent Labs  Lab 11/20/23 0553 11/21/23 0522 11/22/23 0623 11/23/23 0759 11/24/23 0514  NA 126* 134*  135 135 137  K 3.5 3.7 4.3 4.4 4.6  CL 88* 96* 98 98 99  CO2 27 28 28 26 29   GLUCOSE 299* 225* 128* 185* 143*  BUN 24* 28* 28* 27* 32*  CREATININE 0.75 0.65 0.62 0.63 0.68  CALCIUM  8.3* 8.8* 8.7* 8.6* 8.7*   GFR: Estimated Creatinine Clearance: 57.9 mL/min (by C-G formula based on SCr of 0.68 mg/dL). Recent Labs  Lab 11/21/23 0522 11/22/23 0623 11/23/23 0759 11/24/23 0514  WBC 8.5 6.6 7.4 7.6    Liver  Function Tests: Recent Labs  Lab 11/20/23 0553 11/21/23 0522 11/22/23 0623 11/23/23 0759 11/24/23 0514  AST 28 18 18 25 26   ALT 33 29 27 28 29   ALKPHOS 83 80 82 83 80  BILITOT 0.8 0.6 0.5 0.5 0.8  PROT 6.3* 6.0* 5.7* 5.8* 5.9*  ALBUMIN 2.3* 2.3* 2.3* 2.5* 2.6*   No results for input(s): LIPASE, AMYLASE in the last 168 hours. No results for input(s): AMMONIA in the last 168 hours.  ABG    Component Value Date/Time   PHART 7.385 01/18/2015 1023   PCO2ART 48.4 (H) 01/18/2015 1023   PO2ART 47.0 (L) 01/18/2015 1023   HCO3 33.6 (H) 11/19/2023 1059   TCO2 26 03/09/2015 0634   ACIDBASEDEF 7.0 (H) 01/16/2015 1413   O2SAT 70.1 11/19/2023 1059     Coagulation Profile: No results for input(s): INR, PROTIME in the last 168 hours.  Cardiac Enzymes: No results for input(s): CKTOTAL, CKMB, CKMBINDEX, TROPONINI in the last 168 hours.  HbA1C: Hgb A1c MFr Bld  Date/Time Value Ref Range Status  09/30/2023 04:16 AM 7.4 (H) 4.8 - 5.6 % Final    Comment:    (NOTE) Pre diabetes:          5.7%-6.4%  Diabetes:              >6.4%  Glycemic control for   <7.0% adults with diabetes   01/21/2015 01:06 PM 6.8 (H) 4.8 - 5.6 % Final    Comment:    (NOTE)         Pre-diabetes: 5.7 - 6.4         Diabetes: >6.4         Glycemic control for adults with diabetes: <7.0     CBG: Recent Labs  Lab 11/23/23 0724 11/23/23 1122 11/23/23 1611 11/23/23 2044 11/24/23 0741  GLUCAP 163* 314* 287* 290* 152*    Review of Systems:   Review of Systems  Constitutional:  Negative for chills, fever, malaise/fatigue and weight loss.  HENT:  Negative for congestion, sinus pain and sore throat.   Eyes: Negative.   Respiratory:  Positive for shortness of breath. Negative for cough, hemoptysis, sputum production and wheezing.   Cardiovascular:  Negative for chest pain, palpitations, orthopnea, claudication and leg swelling.  Gastrointestinal:  Negative for abdominal pain, heartburn,  nausea and vomiting.  Genitourinary: Negative.   Musculoskeletal:  Negative for joint pain and myalgias.  Skin:  Negative for rash.  Neurological:  Negative for weakness.  Endo/Heme/Allergies: Negative.   Psychiatric/Behavioral: Negative.       Past Medical History:  She,  has a past medical history of Anemia, Anxiety, CHF (congestive heart failure) (HCC), Chronic kidney disease, COPD (chronic obstructive pulmonary disease) (HCC), Coronary artery disease, Diabetes mellitus without complication (HCC) (10/29/2012), ESRD on dialysis (HCC) (01/25/2015), Headache, Hypertension, PONV (postoperative nausea and vomiting), and Shortness of breath dyspnea.   Surgical History:   Past Surgical History:  Procedure Laterality Date   ABDOMINAL  HYSTERECTOMY     ANAL FISSURE REPAIR     APPENDECTOMY     '74- open with gallbladder   AV FISTULA PLACEMENT Left 01/31/2015   Procedure: ARTERIOVENOUS FISTULA CREATION-LEFT BRACHIO-CEPHALIC ;  Surgeon: Redell LITTIE Door, MD;  Location: [facility] OR;  Service: Vascular;  Laterality: Left;   AV FISTULA PLACEMENT Left 04/04/2015   Procedure: INSERTION OF ARTERIOVENOUS (AV) GORE-TEX GRAFT ARM;  Surgeon: Carlin FORBES Haddock, MD;  Location: [place] OR;  Service: Vascular;  Laterality: Left;   BILIARY BRUSHING  10/01/2023   Procedure: BRUSH BIOPSY, BILE DUCT;  Surgeon: Saintclair Jasper, MD;  Location: [facility] ENDOSCOPY;  Service: Gastroenterology;;   BREAST EXCISIONAL BIOPSY Left    BREAST SURGERY     cyst removed   BRONCHIAL BIOPSY  08/18/2023   Procedure: BRONCHOSCOPY, WITH BIOPSY;  Surgeon: Shelah Lamar RAMAN, MD;  Location: [facility] ENDOSCOPY;  Service: Pulmonary;;   BRONCHIAL BRUSHINGS  08/18/2023   Procedure: BRONCHOSCOPY, WITH BRUSH BIOPSY;  Surgeon: Shelah Lamar RAMAN, MD;  Location: [facility] ENDOSCOPY;  Service: Pulmonary;;   CARDIAC CATHETERIZATION     1 coronary stent placed   CHOLECYSTECTOMY     '74-open   COLONOSCOPY WITH PROPOFOL  N/A 11/17/2012   Procedure: COLONOSCOPY WITH PROPOFOL ;  Surgeon:  Gladis MARLA Louder, MD;  Location: [facility] ENDOSCOPY;  Service: Endoscopy;  Laterality: N/A;   CORONARY STENT PLACEMENT     ELBOW SURGERY Right    tendon surgery   ERCP N/A 10/01/2023   Procedure: ERCP, WITH INTERVENTION IF INDICATED;  Surgeon: Saintclair Jasper, MD;  Location: [facility] ENDOSCOPY;  Service: Gastroenterology;  Laterality: N/A;   ESOPHAGOGASTRODUODENOSCOPY (EGD) WITH PROPOFOL  N/A 11/17/2012   Procedure: ESOPHAGOGASTRODUODENOSCOPY (EGD) WITH PROPOFOL ;  Surgeon: Gladis MARLA Louder, MD;  Location: [facility] ENDOSCOPY;  Service: Endoscopy;  Laterality: N/A;   GANGLION CYST EXCISION Bilateral 10/29/2012   wrist   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   LEFT HEART CATHETERIZATION WITH CORONARY ANGIOGRAM N/A 04/14/2014   Procedure: LEFT HEART CATHETERIZATION WITH CORONARY ANGIOGRAM;  Surgeon: Victory LELON Claudene DOUGLAS, MD;  Location: [facility] CATH LAB;  Service: Cardiovascular;  Laterality: N/A;   PERIPHERAL VASCULAR CATHETERIZATION N/A 03/09/2015   Procedure: Fistulagram;  Surgeon: Redell LITTIE Door, MD;  Location: [facility] INVASIVE CV LAB;  Service: Cardiovascular;  Laterality: N/A;   STONE EXTRACTION WITH BASKET  10/01/2023   Procedure: ERCP, WITH LITHROTRIPSY OR REMOVAL OF COMMON BILE DUCT CALCULUS USING BASKET;  Surgeon: Saintclair Jasper, MD;  Location: [facility] ENDOSCOPY;  Service: Gastroenterology;;   TEE WITHOUT CARDIOVERSION N/A 01/20/2015   Procedure: TRANSESOPHAGEAL ECHOCARDIOGRAM (TEE);  Surgeon: Vina Okey GAILS, MD;  Location: [facility] ENDOSCOPY;  Service: Cardiovascular;  Laterality: N/A;   TUBAL LIGATION     VIDEO BRONCHOSCOPY  08/18/2023   Procedure: BRONCHOSCOPY, WITH FLUOROSCOPY;  Surgeon: Shelah Lamar RAMAN, MD;  Location: [facility] ENDOSCOPY;  Service: Pulmonary;;     Social History:   reports that she quit smoking about 31 years ago. Her smoking use included cigarettes. She has never used smokeless tobacco. She reports that she does not drink alcohol and does not use drugs.   Family History:  Her family history includes Breast cancer in her sister;  Cancer in her father and sister; Diabetes in her father and sister; Heart attack in her mother; Hypertension in her father, mother, sister, and sister.   Allergies No Known Allergies   Home Medications  Prior to Admission medications   Medication Sig Start Date End Date Taking? Authorizing Provider  albuterol  (VENTOLIN  HFA) 108 (90 Base) MCG/ACT inhaler Inhale 2 puffs  into the lungs See admin instructions. Inhale 2 puffs into the lungs in the morning and at bedtime- may use an additional 2 puffs twice a day as needed for shortness of breath or wheezing   Yes [provider]  amLODipine  (NORVASC ) 10 MG tablet Take 10 mg by mouth daily. 03/04/17  Yes [provider]  aspirin  EC 81 MG tablet Take 81 mg by mouth daily.   Yes [provider]  atorvastatin  (LIPITOR) 10 MG tablet Take 1 tablet (10 mg total) by mouth daily. Patient taking differently: Take 10 mg by mouth at bedtime. 06/08/20  Yes Claudene Victory ORN, MD  Blood Glucose Calibration (ACCU-CHEK AVIVA) SOLN DX: E11.21 as directed 12/06/16  Yes [provider]  carvedilol  (COREG ) 25 MG tablet Take 12.5 mg by mouth in the morning and at bedtime.   Yes [provider]  glimepiride  (AMARYL ) 2 MG tablet Take 2 mg by mouth in the morning and at bedtime.   Yes [provider]  insulin  lispro (HUMALOG) 100 UNIT/ML KiwkPen Inject 8-13 Units into the skin See admin instructions. Inject 8-13 units into the skin three times a day with meals, PER SLIDING SCALE (when able to eat and tolerate) 01/22/18  Yes [provider]  LANTUS  SOLOSTAR 100 UNIT/ML Solostar Pen Inject 22 Units into the skin in the morning.   Yes [provider]  latanoprost  (XALATAN ) 0.005 % ophthalmic solution Place 1 drop into both eyes at bedtime.   Yes [provider]  lidocaine  (XYLOCAINE ) 2 % solution Patient: Mix 1part 2% viscous lidocaine , 1part H20. Swallow 10mL of diluted mixture, before meals and at  bedtime, up to QID 10/13/23  Yes Izell Domino, MD  nitroGLYCERIN  (NITROSTAT ) 0.4 MG SL tablet Place 0.4 mg under the tongue every 5 (five) minutes as needed for chest pain.   Yes [provider]  predniSONE  (DELTASONE ) 5 MG tablet Take 5 mg by mouth daily with breakfast.  12/22/17  Yes [provider]  prochlorperazine  (COMPAZINE ) 10 MG tablet Take 1 tablet (10 mg total) by mouth every 6 (six) hours as needed for nausea or vomiting. 09/09/23  Yes Sherrod Sherrod, MD  sirolimus  (RAPAMUNE ) 1 MG tablet Take 2 mg by mouth in the morning. 09/16/23  Yes [provider]  tacrolimus  ER (ENVARSUS  XR) 1 MG TB24 Take 1 mg by mouth daily. 08/10/20  Yes [provider]  umeclidinium-vilanterol (ANORO ELLIPTA) 62.5-25 MCG/INH AEPB Inhale 1 puff into the lungs daily.  01/07/18  Yes [provider]  Accu-Chek Softclix Lancets lancets DX: E11.21 as directed 12/06/16   [provider]  carvedilol  (COREG ) 12.5 MG tablet Take 1 tablet (12.5 mg total) by mouth 2 (two) times daily. Patient not taking: Reported on 11/19/2023 06/08/20   Claudene Victory ORN, MD  glucose blood (ACCU-CHEK AVIVA PLUS) test strip use as directed to check  blood sugar 4 times daily    [provider]  ondansetron  (ZOFRAN ) 8 MG tablet Take 1 tablet (8 mg total) by mouth every 8 (eight) hours as needed for nausea or vomiting. Start on the third day after chemotherapy. Patient not taking: Reported on 11/19/2023 09/09/23   Sherrod Sherrod, MD  oxyCODONE -acetaminophen  (PERCOCET/ROXICET) 5-325 MG tablet Take 1 tablet by mouth every 6 (six) hours as needed for severe pain (pain score 7-10). Patient not taking: Reported on 11/19/2023 09/22/23   Shannon Agent, MD     Critical care time: n/a    Dorn Chill, MD [facility] Office: [phone]   See Amion for personal pager PCCM on call pager [phone] until 7pm. Please call Elink 7p-7a. [phone]

## 2023-11-24 NOTE — Progress Notes (Signed)
 RT asked RN to ensure nebulizer treatment is scanned (given by Charge RN at approx 0825 per PT).

## 2023-11-24 NOTE — Progress Notes (Signed)
 PT Cancellation Note  Patient Details Name: EMBERLEY KRAL MRN: [id number] DOB: 1950-09-26   Cancelled Treatment:    Reason Eval/Treat Not Completed: Fatigue/lethargy limiting ability to participate  Patient states that she has been  doing exercises in bed, does not feel up to getting OOB. Will check back another time.  Darice Potters PT Acute Rehabilitation Services Office [phone]  Potters Darice Norris 11/24/2023, 4:57 PM

## 2023-11-24 NOTE — Plan of Care (Signed)

## 2023-11-25 DIAGNOSIS — C3490 Malignant neoplasm of unspecified part of unspecified bronchus or lung: Secondary | ICD-10-CM

## 2023-11-25 DIAGNOSIS — J9601 Acute respiratory failure with hypoxia: Secondary | ICD-10-CM | POA: Diagnosis not present

## 2023-11-25 DIAGNOSIS — J189 Pneumonia, unspecified organism: Secondary | ICD-10-CM | POA: Diagnosis not present

## 2023-11-25 DIAGNOSIS — J441 Chronic obstructive pulmonary disease with (acute) exacerbation: Secondary | ICD-10-CM | POA: Diagnosis not present

## 2023-11-25 DIAGNOSIS — D649 Anemia, unspecified: Secondary | ICD-10-CM

## 2023-11-25 DIAGNOSIS — J700 Acute pulmonary manifestations due to radiation: Secondary | ICD-10-CM | POA: Diagnosis not present

## 2023-11-25 LAB — COMPREHENSIVE METABOLIC PANEL WITH GFR
ALT: 32 U/L (ref 0–44)
AST: 26 U/L (ref 15–41)
Albumin: 2.6 g/dL — ABNORMAL LOW (ref 3.5–5.0)
Alkaline Phosphatase: 87 U/L (ref 38–126)
Anion gap: 7 (ref 5–15)
BUN: 31 mg/dL — ABNORMAL HIGH (ref 8–23)
CO2: 30 mmol/L (ref 22–32)
Calcium: 8.8 mg/dL — ABNORMAL LOW (ref 8.9–10.3)
Chloride: 99 mmol/L (ref 98–111)
Creatinine, Ser: 0.69 mg/dL (ref 0.44–1.00)
GFR, Estimated: >60 mL/min (ref >60)
Glucose, Bld: 89 mg/dL (ref 70–99)
Potassium: 4.7 mmol/L (ref 3.5–5.1)
Sodium: 136 mmol/L (ref 135–145)
Total Bilirubin: 0.6 mg/dL (ref 0.0–1.2)
Total Protein: 6.1 g/dL — ABNORMAL LOW (ref 6.5–8.1)

## 2023-11-25 LAB — CBC
HCT: 30.9 % — ABNORMAL LOW (ref 36.0–46.0)
Hemoglobin: 9.7 g/dL — ABNORMAL LOW (ref 12.0–15.0)
MCH: 27.1 pg (ref 26.0–34.0)
MCHC: 31.4 g/dL (ref 30.0–36.0)
MCV: 86.3 fL (ref 80.0–100.0)
Platelets: 136 K/uL — ABNORMAL LOW (ref 150–400)
RBC: 3.58 MIL/uL — ABNORMAL LOW (ref 3.87–5.11)
RDW: 14.6 % (ref 11.5–15.5)
WBC: 8.8 K/uL (ref 4.0–10.5)
nRBC: 0.3 % — ABNORMAL HIGH (ref 0.0–0.2)

## 2023-11-25 LAB — GLUCOSE, CAPILLARY
Glucose-Capillary: 125 mg/dL — ABNORMAL HIGH (ref 70–99)
Glucose-Capillary: 194 mg/dL — ABNORMAL HIGH (ref 70–99)
Glucose-Capillary: 255 mg/dL — ABNORMAL HIGH (ref 70–99)
Glucose-Capillary: 40 mg/dL — CL (ref 70–99)
Glucose-Capillary: 85 mg/dL (ref 70–99)

## 2023-11-25 LAB — SEDIMENTATION RATE: Sed Rate: 55 mm/h — ABNORMAL HIGH (ref 0–22)

## 2023-11-25 LAB — C-REACTIVE PROTEIN: CRP: 1.2 mg/dL — ABNORMAL HIGH (ref <1.0)

## 2023-11-25 MED ORDER — MELATONIN 5 MG PO TABS
5.0000 mg | ORAL_TABLET | Freq: Once | ORAL | Status: AC
  Administered 2023-11-25: 5 mg via ORAL
  Filled 2023-11-25: qty 1

## 2023-11-25 MED ORDER — REVEFENACIN 175 MCG/3ML IN SOLN
175.0000 ug | Freq: Every day | RESPIRATORY_TRACT | Status: DC
  Administered 2023-11-25 – 2023-12-02 (×7): 175 ug via RESPIRATORY_TRACT
  Filled 2023-11-25 (×7): qty 3

## 2023-11-25 MED ORDER — ARFORMOTEROL TARTRATE 15 MCG/2ML IN NEBU
15.0000 ug | INHALATION_SOLUTION | Freq: Two times a day (BID) | RESPIRATORY_TRACT | Status: DC
  Administered 2023-11-25 – 2023-12-02 (×15): 15 ug via RESPIRATORY_TRACT
  Filled 2023-11-25 (×14): qty 2

## 2023-11-25 MED ORDER — DEXTROSE 50 % IV SOLN
INTRAVENOUS | Status: AC
  Administered 2023-11-25: 25 g via INTRAVENOUS
  Filled 2023-11-25: qty 50

## 2023-11-25 MED ORDER — DEXTROSE 50 % IV SOLN: 25.0000 g | Freq: Once | INTRAVENOUS | Status: AC

## 2023-11-25 NOTE — Progress Notes (Signed)
 Physical Therapy Treatment Patient Details Name: Pamela [name] MRN: [id number] DOB: 1950-12-09 Today's Date: 11/25/2023   History of Present Illness Pamela [name] is a 73 y.o. female admitted with acute respiratory failure with hypoxia. PMH: left lung cancer recently completed chemotherapy mid June 2025, COPD, HTN, dyslipidemia, diabetes mellitus, CAD, anemia, renal transplant followed [facility], [facility]    PT Comments  Pt agreeable to session, husband present for session. She is anxious about OOB activity and O2 sats dropping. Pt able to complete bed mobility at mod I, allowed time for recovery following transfer. O2 sats remain >90% in sitting on 6L. Sit to Stand completed into walker at supervision A with standing tolerance x3 mins before sitting. O2 sats mimic supine to sit transfer. Second Sit to Stand completed with O2 sats reaching 88%, inc to 7L with rebound to 93%, returned to 6L and well maintained. 3rd Sit to Stand completed and side step towards EOB before returning to supine. O2 sats reach 83%, inc to 8L for recovery to 94%, reduced to 6L and pt able to maintain 92% O2. Educated on activity tolerance and allowing refractory period following task to assess response. Pt comfortable with all needs met.     If plan is discharge home, recommend the following: A little help with walking and/or transfers;A little help with bathing/dressing/bathroom;Assistance with cooking/housework;Assist for transportation;Help with stairs or ramp for entrance   Can travel by private vehicle        Equipment Recommendations  None recommended by PT    Recommendations for Other Services       Precautions / Restrictions Precautions Precautions: Fall Recall of Precautions/Restrictions: Intact Precaution/Restrictions Comments: monitor O2 Restrictions Weight Bearing Restrictions Per Provider Order: No     Mobility  Bed Mobility Overal bed mobility: Needs Assistance Bed Mobility: Supine to Sit,  Sit to Supine     Supine to sit: Modified independent (Device/Increase time) Sit to supine: Modified independent (Device/Increase time)        Transfers Overall transfer level: Needs assistance Equipment used: Rolling walker (2 wheels) Transfers: Sit to/from Stand Sit to Stand: Supervision           General transfer comment: unable to progress mobility beyond EOB due to O2 sats    Ambulation/Gait               General Gait Details: not attempted- dyspnea with transfer and pt reports hard work of breathing   Stairs             Wheelchair Mobility     Tilt Bed    Modified Rankin (Stroke Patients Only)       Balance Overall balance assessment: Needs assistance Sitting-balance support: Feet supported Sitting balance-Leahy Scale: Good     Standing balance support: During functional activity, No upper extremity supported Standing balance-Leahy Scale: Fair Standing balance comment: sup A with RW                            Communication Communication Communication: No apparent difficulties  Cognition Arousal: Alert Behavior During Therapy: WFL for tasks assessed/performed   PT - Cognitive impairments: No apparent impairments                         Following commands: Intact      Cueing    Exercises      General Comments General comments (skin integrity, edema, etc.): Pt anxious  about OOB activity, worried about O2 sats dropping, completed progressive activity with time to assess O2 response to tasks      Pertinent Vitals/Pain Pain Assessment Pain Assessment: No/denies pain    Home Living                          Prior Function            PT Goals (current goals can now be found in the care plan section) Acute Rehab PT Goals Patient Stated Goal: breath better PT Goal Formulation: With patient/family Time For Goal Achievement: 12/05/23 Potential to Achieve Goals: Good Progress towards PT goals:  Progressing toward goals    Frequency    Min 3X/week      PT Plan      Co-evaluation              AM-PAC PT 6 Clicks Mobility   Outcome Measure  Help needed turning from your back to your side while in a flat bed without using bedrails?: None Help needed moving from lying on your back to sitting on the side of a flat bed without using bedrails?: None Help needed moving to and from a bed to a chair (including a wheelchair)?: A Little Help needed standing up from a chair using your arms (e.g., wheelchair or bedside chair)?: A Little Help needed to walk in hospital room?: A Lot Help needed climbing 3-5 steps with a railing? : A Lot 6 Click Score: 18    End of Session Equipment Utilized During Treatment: Gait belt;Oxygen Activity Tolerance: Patient tolerated treatment well;Patient limited by fatigue;Treatment limited secondary to medical complications (Comment) (difficulty maintaining O2 >90% with activity) Patient left: with call bell/phone within reach;in bed;with family/visitor present;with bed alarm set Nurse Communication: Mobility status;Other (comment) PT Visit Diagnosis: Other abnormalities of gait and mobility (R26.89);Difficulty in walking, not elsewhere classified (R26.2)     Time: 8499-8468 PT Time Calculation (min) (ACUTE ONLY): 31 min  Charges:    $Therapeutic Activity: 23-37 mins PT General Charges $$ ACUTE PT VISIT: 1 Visit                     Stann, PT Acute Rehabilitation Services Office: [phone] 11/25/2023    Stann DELENA Ohara 11/25/2023, 4:07 PM

## 2023-11-25 NOTE — Progress Notes (Signed)
  Progress Note   Patient: Pamela [name] FMW:[id number] DOB: 1951-03-05 DOA: 11/19/2023     5 DOS: the patient was seen and examined on 11/25/2023   Brief hospital course: 73 year old female with history of lung cancer recently completing chemotherapy, COPD, hyperlipidemia, hypertension, diabetes, CAD, history of renal transplant at [facility] in 2019, history of heart failure with reduced EF who presented to the emergency department with complaints of shortness of breath with wheezing patient reported no significant [place] despite neb treatments. In the emergency department, chest x-ray was notable for a cavitary mass that correspond to recent CT chest with large left lower airspace opacity consistent with postobstructive pneumonia. Patient also noted to have an elevated BNP of 698. Patient was started on empiric antibiotics and 1 dose of IV Lasix . Hospitalist consulted for consideration for medical admission   Assessment and Plan: Acute hypoxemic respiratory failure secondary to radiation pneumonitis -CXR initially concerning for pneumonia, however recent chest CT has findings more c/w radiation changes following 7 weeks of chemoradiation - completed 5 days of empiric cefepime  without much improvement -Continued on BID IV solumedrol.  -Appreciate input by Pulmonary -O2 will likely be slow to wean   COPD exacerbation -Continue DuoNebs as tolerated -no audible wheezing at this time -continued on IV solumedrol BID per above   Left lower lobe pneumonia, postobstructive - ruled out -Initial chest x-ray was suggestive of left-sided pneumonia, likely postobstructive - completed 5 days of abx empirically without much improvement   History of chronic heart failure with reduced EF -Patient given trial 1 dose of IV Lasix  in the emergency department -Renal function remains stable   Stage IIIa non-small cell lung cancer -Followed by oncology and radiation oncology, recently completed treatment -Have  included Oncology and Rad Onc with treatment team   Diabetes mellitus type 2 on insulin  -Continue sliding scale insulin  as needed -Pt is needing more insulin  secondary to high dose IV steoids -Continue semglee  at 30u. Continue 15u meal coverage -Continue resistant scale SSI   Hypertension -Blood pressure currently stable and controlled -Continue current regimen   CAD -Seems stable at this time.  Continue aspirin  and statin   History of renal transplant -Patient status post renal transplant at [facility] in 2019 -Renal function thus far stable -Continue tacrolimus  and Rapamune  -recheck bmet in AM   Hyponatremia -normalized     Subjective: Continues with persistent sob that has not really improved that much  Physical Exam: Vitals:   11/25/23 1208 11/25/23 1244 11/25/23 1248 11/25/23 1602  BP: (!) 156/73   (!) 147/74  Pulse: (!) 103   (!) 106  Resp: 16   17  Temp: 97.6 F (36.4 C)   97.8 F (36.6 C)  TempSrc:      SpO2: 91% 99% 100% 96%  Weight:       General exam: Conversant, in no acute distress Respiratory system: normal chest rise, clear, no audible wheezing Cardiovascular system: regular rhythm, s1-s2 Gastrointestinal system: Nondistended, nontender, pos BS Central nervous system: No seizures, no tremors Extremities: No cyanosis, no joint deformities Skin: No rashes, no pallor Psychiatry: Affect normal // no auditory hallucinations   Data Reviewed:  Labs reviewed: Na 136, k 4.7, Cr 0.69, WBC 8.8, Hgb 9.7, Plts 136  Family Communication: Pt in room, family not at bedside  Disposition: Status is: inpatient Continue inpatient stay because: severity of illness  Planned Discharge Destination: Home    Author: Garnette Pelt, MD 11/25/2023 6:41 PM  For on call review [email or web address].

## 2023-11-25 NOTE — Progress Notes (Signed)
 Pulmonologist lowered O2 from 6LHFNC to 5LHFNC. Patient was found to be at 87% on 5L HFNC about 5 minutes later when we checked. Patient is now 91% on 6L HFNC.

## 2023-11-25 NOTE — Consult Note (Signed)
 NAME:  Pamela [name], MRN:  [id number], DOB:  January 08, 1951, LOS: 5 ADMISSION DATE:  11/19/2023, CONSULTATION DATE:  11/24/23 REFERRING MD:  TRH CHIEF COMPLAINT:  Respiratory Failure   History of Present Illness:  Pamela [name] is a 73 year old woman with lung cancer s/p chemotherapy and radiation, COPD, hypertension, DMII, CAD and renal transplant who is admitted for acute hypoxemic respiratory failure.   CT Chest 11/17/23 showed 5.1cm x 4.5cm thick-walled cavitary mass in the superior segment left lower lobe, improved, with progressive central gas. Multifocal patchy opacities in the left hemithorax, suggestive of radiation changes. Moderate centrilobular emphysema and paraseptal emphysema with upper lung predominance.  She reported progressive dyspnea over the week before coming to the hospital after being seen by her PCP. She denied cough, fevers, chills or sweats. She denies dysphagia, but is having a harder time eating currently due to dyspnea.   Pertinent  Medical History   Past Medical History:  Diagnosis Date   Anemia    Anxiety    CHF (congestive heart failure) (HCC)    Chronic kidney disease    COPD (chronic obstructive pulmonary disease) (HCC)    Coronary artery disease    Diabetes mellitus without complication (HCC) 10/29/2012   ESRD on dialysis (HCC) 01/25/2015   Headache    Hypertension    PONV (postoperative nausea and vomiting)    Shortness of breath dyspnea    Significant Hospital Events: Including procedures, antibiotic start and stop dates in addition to other pertinent events   7/9 admitted 7/14 PCCM consult  Interim History / Subjective:   No acute events overnight No changes in her breathing Tried to wean to 5L but SpO2 was below 88%, so placed back on 6L.  Objective    Blood pressure (!) 164/80, pulse (!) 115, temperature 98.3 F (36.8 C), resp. rate 20, weight 72.6 kg, SpO2 92%.        Intake/Output Summary (Last 24 hours) at 11/25/2023 1014 Last  data filed at 11/25/2023 0530 Gross per 24 hour  Intake --  Output 325 ml  Net -325 ml   Filed Weights   11/23/23 0429 11/24/23 0526 11/25/23 0500  Weight: 73.7 kg 72.9 kg 72.6 kg    Examination: General: elderly woman, mild work of breathing, no distress HENT: [place]/AT, moist mucous membranes Lungs: diminished breath sounds, + wheezing Cardiovascular: tachycardic, no murmurs Abdomen: soft, non-tender, non-distended Extremities: warm, no edema Neuro: alert, moving all extremities GU: n/a  Labs: Procal 0.13 LDH 386 CRP 1.2 ESR 55 Fungitell pending  Resolved problem list   Assessment and Plan   Acute Hypoxemic Respiratory Failure 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  - LDH/ESR/CRP are elevated. Likely due to inflammation. Fungitell is pending. - s/p course of cefepime  7/13 - Goal SpO2 greater than 88%. Will likely need home oxygen therapy. - start scheduled yupelri  and brovana  nebs   PCCM will continue to follow, will need outpatient follow up   Best Practice (right click and Reselect all SmartList Selections daily)   Per primary  Labs   CBC: Recent Labs  Lab 11/21/23 0522 11/22/23 0623 11/23/23 0759 11/24/23 0514 11/25/23 0540  WBC 8.5 6.6 7.4 7.6 8.8  HGB 9.1* 9.0*  9.4* 9.5* 9.7*  HCT 29.3* 28.9* 30.5* 30.6* 30.9*  MCV 86.9 86.0 87.4 86.9 86.3  PLT 154 145* 139* 141* 136*    Basic Metabolic Panel: Recent Labs  Lab 11/21/23 0522 11/22/23 0623 11/23/23 0759 11/24/23 0514 11/25/23 0540  NA 134* 135 135 137 136  K 3.7 4.3 4.4 4.6 4.7  CL 96* 98 98 99 99  CO2 28 28 26 29 30   GLUCOSE 225* 128* 185* 143* 89  BUN 28* 28* 27* 32* 31*  CREATININE  0.65 0.62 0.63 0.68 0.69  CALCIUM  8.8* 8.7* 8.6* 8.7* 8.8*   GFR: Estimated Creatinine Clearance: 57.7 mL/min (by C-G formula based on SCr of 0.69 mg/dL). Recent Labs  Lab 11/22/23 0623 11/23/23 0759 11/24/23 0514 11/24/23 1350 11/25/23 0540  PROCALCITON  --   --   --  0.13  --   WBC 6.6 7.4 7.6  --  8.8    Liver Function Tests: Recent Labs  Lab 11/21/23 0522 11/22/23 0623 11/23/23 0759 11/24/23 0514 11/25/23 0540  AST 18 18 25 26 26   ALT 29 27 28 29  32  ALKPHOS 80 82 83 80 87  BILITOT 0.6 0.5 0.5 0.8 0.6  PROT 6.0* 5.7* 5.8* 5.9* 6.1*  ALBUMIN 2.3* 2.3* 2.5* 2.6* 2.6*   No results for input(s): LIPASE, AMYLASE in the last 168 hours. No results for input(s): AMMONIA in the last 168 hours.  ABG    Component Value Date/Time   PHART 7.385 01/18/2015 1023   PCO2ART 48.4 (H) 01/18/2015 1023   PO2ART 47.0 (L) 01/18/2015 1023   HCO3 33.6 (H) 11/19/2023 1059   TCO2 26 03/09/2015 0634   ACIDBASEDEF 7.0 (H) 01/16/2015 1413   O2SAT 70.1 11/19/2023 1059     Coagulation Profile: No results for input(s): INR, PROTIME in the last 168 hours.  Cardiac Enzymes: No results for input(s): CKTOTAL, CKMB, CKMBINDEX, TROPONINI in the last 168 hours.  HbA1C: Hgb A1c MFr Bld  Date/Time Value Ref Range Status  09/30/2023 04:16 AM 7.4 (H) 4.8 - 5.6 % Final    Comment:    (NOTE) Pre diabetes:          5.7%-6.4%  Diabetes:              >6.4%  Glycemic control for   <7.0% adults with diabetes   01/21/2015 01:06 PM 6.8 (H) 4.8 - 5.6 % Final    Comment:    (NOTE)         Pre-diabetes: 5.7 - 6.4         Diabetes: >6.4         Glycemic control for adults with diabetes: <7.0     CBG: Recent Labs  Lab 11/24/23 0741 11/24/23 1217 11/24/23 1718 11/24/23 2021 11/25/23 0722  GLUCAP 152* 305* 190* 204* 85       Critical care time: n/a    Dorn Chill, MD [facility] Office: [phone]   See Amion for personal  pager PCCM on call pager [phone] until 7pm. Please call Elink 7p-7a. [phone]

## 2023-11-25 NOTE — Progress Notes (Signed)
 Pamela [name]   DOB:03-Sep-1950   FM#:[id number]      ASSESSMENT & PLAN:  Pamela [name] is a 73 year old female patient with oncologic history significant for non-small cell lung cancer.  She was admitted on 11/19/2023 with acute hypoxic respiratory failure concerning for pneumonia with shortness of breath.  She follows with medical oncology/Dr. Sherrod.  Acute hypoxic respiratory failure COPD exacerbation/shortness of breath LLL pneumonia - Patient presented after 1 week of progressive shortness of breath. - Has been on steroids and respiratory treatments, continue as ordered. - Continue supportive care oxygen therapy - On antibiotics, continue as ordered - Critical care team managing closely  Non-small cell lung cancer, stage IIIa (T3, N1, M0), squamous cell carcinoma -Initially diagnosed April 2025 - Status post concurrent chemoradiation with chemotherapy regimen carboplatin  and paclitaxel  x6 cycles completed.  Radiation therapy was completed October 31, 2023. - CT scan done 11/17/2023 showed 5.1 x 4.5 cm cavitary mass in LLL.  Suspected radiation changes in the left hemithorax. - No planned chemotherapy or oncologic systemic therapy during this admission. - Continue supportive care and patient will follow with medical oncology/Dr. Sherrod upon discharge.  Anemia - Likely secondary to malignancy, oncologic therapy, renal history, and poor nutrition - Hemoglobin low 9.7 - Recommend PRBC transfusion for Hgb <7.0 - No transfusional intervention required at this time - Continue to monitor CBC with differential  Thrombocytopenia - Mild - Platelets 136K - Continue to monitor CBC with differential  History of renal transplant - Status post renal transplant 2019 - On tacrolimus , continue as ordered - Continue to monitor renal function closely    Code Status Full  Subjective:  Patient seen awake and alert laying in bed.  She is ill-appearing.  Seen on O2 via nasal cannula.  States  that her oxygen level drops whenever she does anything including sitting up and dangling her feet at the side of the bed.  Denies acute pain.  Family members at bedside.  Objective:   Intake/Output Summary (Last 24 hours) at 11/25/2023 1204 Last data filed at 11/25/2023 1016 Gross per 24 hour  Intake 120 ml  Output 325 ml  Net -205 ml     PHYSICAL EXAMINATION: ECOG PERFORMANCE STATUS: 3 - Symptomatic, >50% confined to bed  Vitals:   11/25/23 1000 11/25/23 1001  BP:  (!) 164/80  Pulse:  (!) 115  Resp:  20  Temp:  98.3 F (36.8 C)  SpO2: 91% 92%   Filed Weights   11/23/23 0429 11/24/23 0526 11/25/23 0500  Weight: 162 lb 7.7 oz (73.7 kg) 160 lb 11.5 oz (72.9 kg) 160 lb 0.9 oz (72.6 kg)    GENERAL: alert, +chronically ill-appearing  SKIN: +Pale skin color, texture, turgor are normal, no rashes or significant lesions EYES: normal, conjunctiva are pink and non-injected, sclera clear OROPHARYNX: no exudate, no erythema and lips, buccal mucosa, and tongue normal  NECK: supple, thyroid normal size, non-tender, without nodularity LYMPH: no palpable lymphadenopathy in the cervical, axillary or inguinal LUNGS: clear to auscultation and percussion with normal breathing effort HEART: regular rate & rhythm and no murmurs and no lower extremity edema ABDOMEN: abdomen soft, non-tender and normal bowel sounds MUSCULOSKELETAL: no cyanosis of digits and no clubbing  PSYCH: alert & oriented x 3 with fluent speech NEURO: no focal motor/sensory deficits   All questions were answered. The patient knows to call the clinic with any problems, questions or concerns.   The total time spent in the appointment was 40 minutes encounter with patient  including review of chart and various tests results, discussions about plan of care and coordination of care plan  Olam JINNY Brunner, NP 11/25/2023 12:04 PM    Labs Reviewed:  Lab Results  Component Value Date   WBC 8.8 11/25/2023   HGB 9.7 (L) 11/25/2023    HCT 30.9 (L) 11/25/2023   MCV 86.3 11/25/2023   PLT 136 (L) 11/25/2023   Recent Labs    11/23/23 0759 11/24/23 0514 11/25/23 0540  NA 135 137 136  K 4.4 4.6 4.7  CL 98 99 99  CO2 26 29 30   GLUCOSE 185* 143* 89  BUN 27* 32* 31*  CREATININE 0.63 0.68 0.69  CALCIUM  8.6* 8.7* 8.8*  GFRNONAA >60 >60 >60  PROT 5.8* 5.9* 6.1*  ALBUMIN 2.5* 2.6* 2.6*  AST 25 26 26   ALT 28 29 32  ALKPHOS 83 80 87  BILITOT 0.5 0.8 0.6    Studies Reviewed:  DG CHEST PORT 1 VIEW Result Date: 11/24/2023 CLINICAL DATA:  Radiation pneumonitis. EXAM: PORTABLE CHEST 1 VIEW COMPARISON:  November 19, 2023.  November 17, 2023. FINDINGS: The heart size and mediastinal contours are within normal limits. Right lung is clear. Cavitary abnormality is noted in left perihilar region corresponding to walled cavitary mass noted on previous CT scan in superior segment of left lower lobe consistent with history of lung cancer. Stable reticular densities are noted throughout the left lung base which may represent radiation fibrosis. The visualized skeletal structures are unremarkable. IMPRESSION: Cavitary abnormality seen in left lower lobe corresponding to thick-walled cavitary mass or malignancy noted on prior CT scan. 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 Electronically Signed   By: Lynwood Landy Raddle M.D.   On: 11/24/2023 10:40   ECHOCARDIOGRAM COMPLETE Result Date: 11/19/2023    ECHOCARDIOGRAM REPORT   Patient Name:   Pamela [name] Date of Exam: 11/19/2023 Medical Rec #:  [id number]        Height:       61.5 in Accession #:    [id number]       Weight:       160.0 lb Date of Birth:  August 10, 1950       BSA:          1.728 m Patient Age:    72 years         BP:           127/63 mmHg Patient Gender: F                HR:           85 bpm. Exam Location:  Inpatient Procedure: 2D Echo, Cardiac Doppler and Color Doppler (Both Spectral and Color            Flow Doppler were utilized during procedure). Indications:     CHF  History:        Patient has prior history of Echocardiogram examinations, most                 recent 01/26/2019. Acute Respiratory Failure.  Sonographer:    Benard Stallion Referring Phys: [id number] ALLISON L ELLIS IMPRESSIONS  1. Left ventricular ejection fraction, by estimation, is 55 to 60%. The left ventricle has normal function. The left ventricle has no regional wall motion abnormalities. Left ventricular diastolic parameters are consistent with Grade I diastolic dysfunction (impaired relaxation). Elevated left atrial pressure.  2. Right ventricular systolic function is normal. The right ventricular size is normal. Tricuspid regurgitation signal  is inadequate for assessing PA pressure.  3. The mitral valve is normal in structure. Trivial mitral valve regurgitation. No evidence of mitral stenosis.  4. The aortic valve is tricuspid. Aortic valve regurgitation is not visualized. No aortic stenosis is present.  5. The inferior vena cava is normal in size with greater than 50% respiratory variability, suggesting right atrial pressure of 3 mmHg. FINDINGS  Left Ventricle: Left ventricular ejection fraction, by estimation, is 55 to 60%. The left ventricle has normal function. The left ventricle has no regional wall motion abnormalities. The left ventricular internal cavity size was normal in size. There is  no left ventricular hypertrophy. Left ventricular diastolic parameters are consistent with Grade I diastolic dysfunction (impaired relaxation). Elevated left atrial pressure. Right Ventricle: The right ventricular size is normal. Right ventricular systolic function is normal. Tricuspid regurgitation signal is inadequate for assessing PA pressure. The tricuspid regurgitant velocity is 2.54 m/s, and with an assumed right atrial  pressure of 3 mmHg, the estimated right ventricular systolic pressure is 28.8 mmHg. Left Atrium: Left atrial size was normal in size. Right Atrium: Right atrial size was normal in size.  Pericardium: There is no evidence of pericardial effusion. Mitral Valve: The mitral valve is normal in structure. Trivial mitral valve regurgitation. No evidence of mitral valve stenosis. Tricuspid Valve: The tricuspid valve is normal in structure. Tricuspid valve regurgitation is trivial. No evidence of tricuspid stenosis. Aortic Valve: The aortic valve is tricuspid. Aortic valve regurgitation is not visualized. No aortic stenosis is present. Aortic valve mean gradient measures 4.0 mmHg. Aortic valve peak gradient measures 7.2 mmHg. Pulmonic Valve: The pulmonic valve was normal in structure. Pulmonic valve regurgitation is not visualized. No evidence of pulmonic stenosis. Aorta: The aortic root is normal in size and structure. Venous: The inferior vena cava is normal in size with greater than 50% respiratory variability, suggesting right atrial pressure of 3 mmHg. IAS/Shunts: The interatrial septum is aneurysmal. No atrial level shunt detected by color flow Doppler.  LEFT VENTRICLE PLAX 2D LVIDd:         4.30 cm   Diastology LVIDs:         2.80 cm   LV e' medial:    5.44 cm/s LV PW:         0.90 cm   LV E/e' medial:  17.2 LV IVS:        0.90 cm   LV e' lateral:   7.29 cm/s LVOT diam:     1.80 cm   LV E/e' lateral: 12.9 LV SV:         50 LV SV Index:   29 LVOT Area:     2.54 cm  RIGHT VENTRICLE RV Basal diam:  2.20 cm RV Mid diam:    1.80 cm RV S prime:     11.40 cm/s TAPSE (M-mode): 2.0 cm LEFT ATRIUM             Index        RIGHT ATRIUM          Index LA diam:        3.20 cm 1.85 cm/m   RA Area:     8.33 cm LA Vol (A2C):   34.1 ml 19.73 ml/m  RA Volume:   11.80 ml 6.83 ml/m LA Vol (A4C):   44.9 ml 25.98 ml/m LA Biplane Vol: 41.2 ml 23.84 ml/m  AORTIC VALVE AV Area (Vmax):    1.56 cm AV Area (Vmean):   1.48 cm AV  Area (VTI):     1.99 cm AV Vmax:           134.00 cm/s AV Vmean:          95.000 cm/s AV VTI:            0.251 m AV Peak Grad:      7.2 mmHg AV Mean Grad:      4.0 mmHg LVOT Vmax:         82.30  cm/s LVOT Vmean:        55.100 cm/s LVOT VTI:          0.196 m LVOT/AV VTI ratio: 0.78  AORTA Ao Root diam: 2.60 cm Ao Asc diam:  2.90 cm MITRAL VALVE                TRICUSPID VALVE MV Area (PHT): 4.80 cm     TR Peak grad:   25.8 mmHg MV Decel Time: 158 msec     TR Vmax:        254.00 cm/s MV E velocity: 93.80 cm/s MV A velocity: 138.00 cm/s  SHUNTS MV E/A ratio:  0.68         Systemic VTI:  0.20 m                             Systemic Diam: 1.80 cm Redell Shallow MD Electronically signed by Redell Shallow MD Signature Date/Time: 11/19/2023/2:09:35 PM    Final    DG Chest Portable 1 View Result Date: 11/19/2023 CLINICAL DATA:  Shortness of breath. EXAM: PORTABLE CHEST 1 VIEW COMPARISON:  August 18, 2023.  November 17, 2023. FINDINGS: Normal cardiac size. Right lung is clear. Left perihilar density is noted which corresponds to cavitary mass noted on prior CT scan. Left lower lobe airspace opacity is noted most consistent with pneumonia or atelectasis. The visualized skeletal structures are unremarkable. IMPRESSION: Left perihilar density is noted which corresponds to cavitary mass seen on recent CT scan. Large left lower lobe airspace opacity is noted most consistent with postobstructive pneumonia or atelectasis. Electronically Signed   By: Lynwood Landy Raddle M.D.   On: 11/19/2023 10:15   CT Chest W Contrast Result Date: 11/17/2023 EXAM: CT CHEST WITH CONTRAST 11/17/2023 10:13:11 AM TECHNIQUE: CT of the chest was performed with the administration of intravenous contrast. Multiplanar reformatted images are provided for review. Automated exposure control, iterative reconstruction, and/or weight based adjustment of the mA/kV was utilized to reduce the radiation dose to as low as reasonably achievable. COMPARISON: PET/CT dated 09/04/2023. CLINICAL HISTORY: Non-small cell lung cancer (NSCLC), staging. Patient for f/u non small cell lung cancer; Sx to chest. FINDINGS: MEDIASTINUM: Heart and pericardium are unremarkable. The  central airways are clear. Moderate 3-vessel coronary atherosclerosis. Thoracic aortic atherosclerosis. LYMPH NODES: No mediastinal, hilar or axillary lymphadenopathy. LUNGS AND PLEURA: 5.1 x 4.5 cm thick-walled cavitary mass in the superior segment left lower lobe (image 59), improved, now with progressive central gas. Multifocal patchy opacities in the left hemithorax (image 78), new, suggesting radiation changes. Moderate centrilobular and paraseptal emphysematous changes, upper lung predominant. No pleural effusion or pneumothorax. SOFT TISSUES/BONES: Mild degenerative changes of the visualized thoracolumbar spine. No acute abnormality of the soft tissues. UPPER ABDOMEN: Status post cholecystectomy with pneumobilia. Severe bilateral renal cortical scarring/atrophy with a dominant 1.6 cm right upper pole simple cyst (image 137), benign. No follow up is recommended. IMPRESSION: 1. 5.1 x 4.5 cm thick-walled cavitary mass in the  superior segment left lower lobe, corresponding to the patient's known primary bronchogenic carcinoma, improved. 2. Suspected radiation changes in the left hemithorax. Electronically signed by: Pinkie Pebbles MD 11/17/2023 08:17 PM EDT RP Workstation: [id number]

## 2023-11-26 DIAGNOSIS — J9601 Acute respiratory failure with hypoxia: Secondary | ICD-10-CM | POA: Diagnosis not present

## 2023-11-26 DIAGNOSIS — J441 Chronic obstructive pulmonary disease with (acute) exacerbation: Secondary | ICD-10-CM | POA: Diagnosis not present

## 2023-11-26 DIAGNOSIS — J700 Acute pulmonary manifestations due to radiation: Secondary | ICD-10-CM | POA: Diagnosis not present

## 2023-11-26 LAB — GLUCOSE, CAPILLARY
Glucose-Capillary: 150 mg/dL — ABNORMAL HIGH (ref 70–99)
Glucose-Capillary: 170 mg/dL — ABNORMAL HIGH (ref 70–99)
Glucose-Capillary: 182 mg/dL — ABNORMAL HIGH (ref 70–99)
Glucose-Capillary: 211 mg/dL — ABNORMAL HIGH (ref 70–99)
Glucose-Capillary: 212 mg/dL — ABNORMAL HIGH (ref 70–99)
Glucose-Capillary: 228 mg/dL — ABNORMAL HIGH (ref 70–99)

## 2023-11-26 LAB — COMPREHENSIVE METABOLIC PANEL WITH GFR
ALT: 33 U/L (ref 0–44)
AST: 28 U/L (ref 15–41)
Albumin: 2.6 g/dL — ABNORMAL LOW (ref 3.5–5.0)
Alkaline Phosphatase: 78 U/L (ref 38–126)
Anion gap: 9 (ref 5–15)
BUN: 25 mg/dL — ABNORMAL HIGH (ref 8–23)
CO2: 28 mmol/L (ref 22–32)
Calcium: 8.5 mg/dL — ABNORMAL LOW (ref 8.9–10.3)
Chloride: 96 mmol/L — ABNORMAL LOW (ref 98–111)
Creatinine, Ser: 0.48 mg/dL (ref 0.44–1.00)
GFR, Estimated: >60 mL/min (ref >60)
Glucose, Bld: 172 mg/dL — ABNORMAL HIGH (ref 70–99)
Potassium: 4.5 mmol/L (ref 3.5–5.1)
Sodium: 133 mmol/L — ABNORMAL LOW (ref 135–145)
Total Bilirubin: 0.6 mg/dL (ref 0.0–1.2)
Total Protein: 5.6 g/dL — ABNORMAL LOW (ref 6.5–8.1)

## 2023-11-26 LAB — CBC
HCT: 30.1 % — ABNORMAL LOW (ref 36.0–46.0)
Hemoglobin: 9.3 g/dL — ABNORMAL LOW (ref 12.0–15.0)
MCH: 27 pg (ref 26.0–34.0)
MCHC: 30.9 g/dL (ref 30.0–36.0)
MCV: 87.5 fL (ref 80.0–100.0)
Platelets: 128 K/uL — ABNORMAL LOW (ref 150–400)
RBC: 3.44 MIL/uL — ABNORMAL LOW (ref 3.87–5.11)
RDW: 14.5 % (ref 11.5–15.5)
WBC: 8.1 K/uL (ref 4.0–10.5)
nRBC: 0.6 % — ABNORMAL HIGH (ref 0.0–0.2)

## 2023-11-26 MED ORDER — MELATONIN 3 MG PO TABS
3.0000 mg | ORAL_TABLET | Freq: Every evening | ORAL | Status: DC | PRN
  Administered 2023-11-26 – 2023-12-01 (×6): 3 mg via ORAL
  Filled 2023-11-26 (×6): qty 1

## 2023-11-26 MED ORDER — PREDNISONE 20 MG PO TABS: 40.0000 mg | ORAL_TABLET | Freq: Every day | ORAL | Status: DC

## 2023-11-26 MED ORDER — PREDNISONE 20 MG PO TABS
40.0000 mg | ORAL_TABLET | Freq: Every day | ORAL | Status: DC
  Administered 2023-11-27 – 2023-12-02 (×6): 40 mg via ORAL
  Filled 2023-11-26 (×6): qty 2

## 2023-11-26 NOTE — Progress Notes (Signed)
 Occupational Therapy Treatment Patient Details Name: Pamela [name] MRN: [id number] DOB: Aug 14, 1950 Today's Date: 11/26/2023   History of present illness Pamela [name] is a 73 yr old female admitted with acute respiratory failure with hypoxia. PMH: left lung cancer recently completed chemotherapy mid June 2025, COPD, HTN, dyslipidemia, diabetes mellitus, CAD, anemia, renal transplant followed [facility], [facility]   OT comments  The pt performed upper body grooming with set-up/supervision seated EOB. She further required supervision to stand and to demo marching in place using a RW. She was instructed on implementing deep breathing exercises and therapeutic rest breaks as needed, given fatigue with activity and feelings of shortness of breath. She demonstrated fair+ tolerance while being instructed on, then performing therapeutic exercises seated EOB. Her vitals were taken and noted to be: 103 bpm supine at rest, 94% on 5L in supine at rest, 107 bpm seated EOB, 91% seated EOB on 5L, and 112 bpm standing, 89% standing on 5L. Continue OT plan care. Home health OT is recommended.       If plan is discharge home, recommend the following:  Assistance with cooking/housework;Help with stairs or ramp for entrance   Equipment Recommendations  None recommended by OT    Recommendations for Other Services      Precautions / Restrictions Precautions Precaution/Restrictions Comments: monitor O2 Restrictions Weight Bearing Restrictions Per Provider Order: No       Mobility Bed Mobility Overal bed mobility: Needs Assistance Bed Mobility: Supine to Sit, Sit to Supine     Supine to sit: Modified independent (Device/Increase time) Sit to supine: Modified independent (Device/Increase time)        Transfers Overall transfer level: Needs assistance Equipment used: Rolling walker (2 wheels) Transfers: Sit to/from Stand Sit to Stand: Supervision           General transfer comment: Pt was  further instructed on marching in place using the RW, to facilitate general strengthening and improved activity tolerance needed to prep her for progressive ADL participation.     Balance     Sitting balance-Leahy Scale: Good       Standing balance-Leahy Scale: Fair           ADL either performed or assessed with clinical judgement   ADL Overall ADL's : Needs assistance/impaired     Grooming: Set up;Sitting;Supervision/safety Grooming Details (indicate cue type and reason): She performed upper body grooming seated EOB. She was instructed on implementing deep breathing exercises as needed, given feelings of fatigue with activity and shortness of breath.            Communication Communication Communication: No apparent difficulties   Cognition Arousal: Alert Behavior During Therapy: WFL for tasks assessed/performed Cognition: No apparent impairments             OT - Cognition Comments: Oriented x4                 Following commands: Intact                      Pertinent Vitals/ Pain       Pain Assessment Pain Assessment: No/denies pain   Frequency  Min 2X/week        Progress Toward Goals  OT Goals(current goals can now be found in the care plan section)  Progress towards OT goals: Progressing toward goals  Acute Rehab OT Goals Patient Stated Goal: to get better OT Goal Formulation: With patient Time For Goal Achievement: 12/05/23 Potential to  Achieve Goals: Good  Plan         AM-PAC OT 6 Clicks Daily Activity     Outcome Measure   Help from another person eating meals?: None Help from another person taking care of personal grooming?: None Help from another person toileting, which includes using toliet, bedpan, or urinal?: A Little Help from another person bathing (including washing, rinsing, drying)?: A Little Help from another person to put on and taking off regular upper body clothing?: A Little Help from another person to  put on and taking off regular lower body clothing?: A Little 6 Click Score: 20    End of Session Equipment Utilized During Treatment: Rolling walker (2 wheels);Oxygen  OT Visit Diagnosis: Muscle weakness (generalized) (M62.81);Unsteadiness on feet (R26.81)   Activity Tolerance Other (comment) (Fair+ tolerance)   Patient Left in bed;with call bell/phone within reach;with bed alarm set;with family/visitor present   Nurse Communication Mobility status        Time: 8669-8650 OT Time Calculation (min): 19 min  Charges: OT General Charges $OT Visit: 1 Visit OT Treatments $Therapeutic Activity: 8-22 mins     Delanna LITTIE Molt, OTR/L 11/26/2023, 3:06 PM

## 2023-11-26 NOTE — Consult Note (Signed)
 NAME:  Pamela [name], MRN:  [id number], DOB:  [birth year], LOS: 6 ADMISSION DATE:  11/19/2023, CONSULTATION DATE:  11/24/23 REFERRING MD:  TRH CHIEF COMPLAINT:  Respiratory Failure   History of Present Illness:  Pamela [name] is a 73 year old woman with lung cancer s/p chemotherapy and radiation, COPD, hypertension, DMII, CAD and renal transplant who is admitted for acute hypoxemic respiratory failure.   CT Chest 11/17/23 showed 5.1cm x 4.5cm thick-walled cavitary mass in the superior segment left lower lobe, improved, with progressive central gas. Multifocal patchy opacities in the left hemithorax, suggestive of radiation changes. Moderate centrilobular emphysema and paraseptal emphysema with upper lung predominance.  She reported progressive dyspnea over the week before coming to the hospital after being seen by her PCP. She denied cough, fevers, chills or sweats. She denies dysphagia, but is having a harder time eating currently due to dyspnea.   Pertinent  Medical History   Past Medical History:  Diagnosis Date   Anemia    Anxiety    CHF (congestive heart failure) (HCC)    Chronic kidney disease    COPD (chronic obstructive pulmonary disease) (HCC)    Coronary artery disease    Diabetes mellitus without complication (HCC) 10/29/2012   ESRD on dialysis (HCC) 01/25/2015   Headache    Hypertension    PONV (postoperative nausea and vomiting)    Shortness of breath dyspnea    Significant Hospital Events: Including procedures, antibiotic start and stop dates in addition to other pertinent events   7/9 admitted 7/14 PCCM consult  Interim History / Subjective:   Remains on 6L/min  Objective    Blood pressure (!) 151/78, pulse (!) 102, temperature 97.7 F (36.5 C), temperature source Oral, resp. rate 20, weight 74.6 kg, SpO2 92%.        Intake/Output Summary (Last 24 hours) at 11/26/2023 9081 Last data filed at 11/26/2023 9167 Gross per 24 hour  Intake 600 ml  Output 750 ml   Net -150 ml   Filed Weights   11/24/23 0526 11/25/23 0500 11/26/23 0500  Weight: 72.9 kg 72.6 kg 74.6 kg    Examination: General: Comfortable on 6 L/min, laying in bed HENT: Oropharynx clear, strong voice, no secretions or stridor Lungs: Distant, bilateral soft end expiratory wheezes, few inspiratory crackles on the left Cardiovascular: Regular, no murmur Abdomen: Nondistended with positive bowel sounds Extremities: No edema Neuro: Awake and alert, appropriate, follows commands, moves all extremities with good strength    Resolved problem list   Assessment and Plan   Acute Hypoxemic Respiratory Failure 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  Will arrange this as an outpatient - Currently on Brovana , Yupelri .  Could transition back to her usual Anoro at the time of discharge, but she believes the nebulized treatments may be more effective.  Would check to see how expensive these would be, consider ordering them as an outpatient as a replacement of the Anoro. - Follow Fungitell results - Suspect she will need to be discharged with supplemental oxygen with exertion.  Walking oximetry will need to be performed to arrange this - Will arrange for hospital follow-up office visit with pulmonary in about 1 month  PCCM will continue to follow, will need outpatient follow up   Best Practice (right click and Reselect all SmartList Selections daily)   Per primary  Labs   CBC: Recent Labs  Lab 11/22/23 0623 11/23/23 0759 11/24/23 0514 11/25/23 0540 11/26/23 0539  WBC 6.6 7.4 7.6 8.8 8.1  HGB 9.0* 9.4* 9.5* 9.7* 9.3*  HCT 28.9* 30.5* 30.6* 30.9* 30.1*  MCV 86.0 87.4 86.9  86.3 87.5  PLT 145* 139* 141* 136* 128*    Basic Metabolic Panel: Recent Labs  Lab 11/22/23 0623 11/23/23 0759 11/24/23 0514 11/25/23 0540 11/26/23 0539  NA 135 135 137 136 133*  K 4.3 4.4 4.6 4.7 4.5  CL 98 98 99 99 96*  CO2 28 26 29 30 28   GLUCOSE 128* 185* 143* 89 172*  BUN 28* 27* 32* 31* 25*  CREATININE 0.62 0.63 0.68 0.69 0.48  CALCIUM  8.7* 8.6* 8.7* 8.8* 8.5*   GFR: Estimated Creatinine Clearance: 58.5 mL/min (by C-G formula based on SCr of 0.48 mg/dL). Recent Labs  Lab 11/23/23 0759 11/24/23 0514 11/24/23 1350 11/25/23 0540 11/26/23 0539  PROCALCITON  --   --  0.13  --   --   WBC 7.4 7.6  --  8.8 8.1    Liver Function Tests: Recent Labs  Lab 11/22/23 0623 11/23/23 0759 11/24/23 0514 11/25/23 0540 11/26/23 0539  AST 18 25 26 26 28   ALT 27 28 29  32 33  ALKPHOS 82 83 80 87 78  BILITOT 0.5 0.5 0.8 0.6 0.6  PROT 5.7* 5.8* 5.9* 6.1* 5.6*  ALBUMIN 2.3* 2.5* 2.6* 2.6* 2.6*   No results for input(s): LIPASE, AMYLASE in the last 168 hours. No results for input(s): AMMONIA in the last 168 hours.  ABG    Component Value Date/Time   PHART 7.385 01/18/2015 1023   PCO2ART 48.4 (H) 01/18/2015 1023   PO2ART 47.0 (L) 01/18/2015 1023   HCO3 33.6 (H) 11/19/2023 1059   TCO2 26 03/09/2015 0634   ACIDBASEDEF 7.0 (H) 01/16/2015 1413   O2SAT 70.1 11/19/2023 1059     Coagulation Profile: No results for input(s): INR, PROTIME in the last 168 hours.  Cardiac Enzymes: No results for input(s): CKTOTAL, CKMB, CKMBINDEX, TROPONINI in the last 168 hours.  HbA1C: Hgb A1c MFr Bld  Date/Time Value Ref Range Status  09/30/2023 04:16 AM 7.4 (H) 4.8 - 5.6 % Final    Comment:    (NOTE) Pre diabetes:          5.7%-6.4%  Diabetes:              >6.4%  Glycemic control for   <7.0% adults with diabetes   01/21/2015 01:06 PM 6.8 (H) 4.8 - 5.6 % Final    Comment:    (NOTE)         Pre-diabetes: 5.7 - 6.4         Diabetes: >6.4         Glycemic  control for adults with diabetes: <7.0     CBG: Recent Labs  Lab 11/25/23 2037 11/25/23 2331 11/26/23 0001 11/26/23 0425 11/26/23 0721  GLUCAP 125* 40* 182* 170* 150*       Critical care time: n/a     Lamar Chris, MD, PhD 11/26/2023, 9:26 AM [facility] [phone] or if no answer before 7:00PM call [phone] For any issues after 7:00PM please call eLink [phone]

## 2023-11-26 NOTE — Progress Notes (Signed)
 PROGRESS NOTE    ANJALEE COPE  FMW:[id number] DOB: 03-25-1951 DOA: 11/19/2023 PCP: Ransom Other, MD   Brief Narrative: Pamela [name] is a 73 y.o. female with a history of lung cancer status post chemoradiation, COPD, hyperlipidemia, hypertension, diabetes, CAD, renal transplant, heart failure with reduced EF.  Patient presented secondary to shortness of breath and wheezing with initial concern for postobstructive pneumonia with associated acute respiratory failure.  Patient initially managed with IV antibiotics without significant improvement.  CT imaging consistent with radiation pneumonitis and patient was managed on steroids instead.  Pulmonology was consulted for management as well.  Patient with improvement on current regimen of steroids and is now initiating a taper.   Assessment and Plan:  Acute respite failure with hypoxia Presumed secondary to radiation pneumonitis.  Initial concern for pneumonia on chest x-ray, however chest CT suggest likely radiation changes.  Patient requiring as much as 6 L/min supplemental oxygen. Wean to room air as able, but patient will likely have to discharge with supplemental oxygen  Radiation pneumonitis In setting of history of chemoradiation.  Pulmonology consult this admission.  Patient started on Solu-Medrol  IV for management and has been transitioned to prednisone . - Pulmonology recommendations: Prednisone  taper starting with prednisone  40 mg for 4 weeks followed by taper of 10 mg weekly until back to home dose of 5 mg daily  COPD exacerbation Patient management as above.  Pulmonology added Yupelri  to her regimen. - Continue Yupelri , Brovana , DuoNebs  Chronic heart failure with recovered EF Stable.  No evidence that patient's current admission is related to acute heart failure. Transthoracic Echocardiogram this admission significant for a recovered LVEF of 55-60% and associated grade 1 diastolic dysfunction. - Continue Coreg   History  of stage IIIa non-small cell lung cancer Patient follows with oncology as an outpatient.  Patient is seen medical and radiation oncology and recently completed treatment.  Diabetes mellitus type 2 Well-controlled based on last hemoglobin A1c of 7.4% from May 2025.  Patient is on Lantus , Humalog, glimepiride  as an outpatient. - Continue insulin  glargine 40 units daily - Continue insulin  aspart 15 units 3 times daily with meals - Continue sliding scale insulin  - Discontinue glimepiride   Primary hypertension -Continue amlodipine , Coreg   CAD Stable. For continue aspirin  and Lipitor  History of renal transplant Transplant from 2019. - Continue tacrolimus  and Rapamune   Hyponatremia Mild.  Asymptomatic.   DVT prophylaxis: Lovenox  Code Status:   Code Status: Full Code Family Communication: Husband at bedside Disposition Plan: Discharge home with home health once able to wean oxygen down Lovenox  to accommodate as an outpatient   Consultants:  Pulmonology  Procedures:  Transthoracic Echocardiogram  Antimicrobials: Vancomycin  Ceftriaxone  Cefepime  Azithromycin     Subjective: Patient reports breathing is improving day-by-day. No issues overnight.  Objective: BP (!) 147/70 (BP Location: Right Arm)   Pulse 100   Temp 98.2 F (36.8 C) (Oral)   Resp 18   Wt 74.6 kg   SpO2 94%   BMI 30.57 kg/m   Examination:  General exam: Appears calm and comfortable Respiratory system: Diminished with faint left-sided expiratory wheeze. Respiratory effort normal. Cardiovascular system: S1 & S2 heard, RRR. No murmurs, rubs, gallops or clicks. Gastrointestinal system: Abdomen is nondistended, soft and nontender. Normal bowel sounds heard. Central nervous system: Alert and oriented. No focal neurological deficits. Psychiatry: Judgement and insight appear normal. Mood & affect appropriate.    Data Reviewed: I have personally reviewed following labs and imaging studies  CBC Lab  Results  Component Value  Date   WBC 8.1 11/26/2023   RBC 3.44 (L) 11/26/2023   HGB 9.3 (L) 11/26/2023   HCT 30.1 (L) 11/26/2023   MCV 87.5 11/26/2023   MCH 27.0 11/26/2023   PLT 128 (L) 11/26/2023   MCHC 30.9 11/26/2023   RDW 14.5 11/26/2023   LYMPHSABS 0.1 (L) 11/17/2023   MONOABS 1.1 (H) 11/17/2023   EOSABS 0.0 11/17/2023   BASOSABS 0.0 11/17/2023     Last metabolic panel Lab Results  Component Value Date   NA 133 (L) 11/26/2023   K 4.5 11/26/2023   CL 96 (L) 11/26/2023   CO2 28 11/26/2023   BUN 25 (H) 11/26/2023   CREATININE 0.48 11/26/2023   GLUCOSE 172 (H) 11/26/2023   GFRNONAA >60 11/26/2023   GFRAA 6 (L) 11/24/2017   CALCIUM  8.5 (L) 11/26/2023   PHOS 4.6 11/24/2017   PROT 5.6 (L) 11/26/2023   ALBUMIN 2.6 (L) 11/26/2023   BILITOT 0.6 11/26/2023   ALKPHOS 78 11/26/2023   AST 28 11/26/2023   ALT 33 11/26/2023   ANIONGAP 9 11/26/2023    GFR: Estimated Creatinine Clearance: 58.5 mL/min (by C-G formula based on SCr of 0.48 mg/dL).  Recent Results (from the past 240 hours)  Resp panel by RT-PCR (RSV, Flu A&B, Covid) Anterior Nasal Swab     Status: None   Collection Time: 11/19/23 10:51 AM   Specimen: Anterior Nasal Swab  Result Value Ref Range Status   SARS Coronavirus 2 by RT PCR NEGATIVE NEGATIVE Final    Comment: (NOTE) SARS-CoV-2 target nucleic acids are NOT DETECTED.  The SARS-CoV-2 RNA is generally detectable in upper respiratory specimens during the acute phase of infection. The lowest concentration of SARS-CoV-2 viral copies this assay can detect is 138 copies/mL. A negative result does not preclude SARS-Cov-2 infection and should not be used as the sole basis for treatment or other patient management decisions. A negative result may occur with  improper specimen collection/handling, submission of specimen other than nasopharyngeal swab, presence of viral mutation(s) within the areas targeted by this assay, and inadequate number of  viral copies(<138 copies/mL). A negative result must be combined with clinical observations, patient history, and epidemiological information. The expected result is Negative.  Fact Sheet for Patients:  BloggerCourse.com  Fact Sheet for Healthcare Providers:  SeriousBroker.it  This test is no t yet approved or cleared by the [place]  FDA and  has been authorized for detection and/or diagnosis of SARS-CoV-2 by FDA under an Emergency Use Authorization (EUA). This EUA will remain  in effect (meaning this test can be used) for the duration of the COVID-19 declaration under Section 564(b)(1) of the Act, 21 U.S.C.section 360bbb-3(b)(1), unless the authorization is terminated  or revoked sooner.       Influenza A by PCR NEGATIVE NEGATIVE Final   Influenza B by PCR NEGATIVE NEGATIVE Final    Comment: (NOTE) The Xpert Xpress SARS-CoV-2/FLU/RSV plus assay is intended as an aid in the diagnosis of influenza from Nasopharyngeal swab specimens and should not be used as a sole basis for treatment. Nasal washings and aspirates are unacceptable for Xpert Xpress SARS-CoV-2/FLU/RSV testing.  Fact Sheet for Patients: BloggerCourse.com  Fact Sheet for Healthcare Providers: SeriousBroker.it  This test is not yet approved or cleared by the [place]  FDA and has been authorized for detection and/or diagnosis of SARS-CoV-2 by FDA under an Emergency Use Authorization (EUA). This EUA will remain in effect (meaning this test can be used) for the duration of the COVID-19 declaration under  Section 564(b)(1) of the Act, 21 U.S.C. section 360bbb-3(b)(1), unless the authorization is terminated or revoked.     Resp Syncytial Virus by PCR NEGATIVE NEGATIVE Final    Comment: (NOTE) Fact Sheet for Patients: BloggerCourse.com  Fact Sheet for Healthcare  Providers: SeriousBroker.it  This test is not yet approved or cleared by the [place]  FDA and has been authorized for detection and/or diagnosis of SARS-CoV-2 by FDA under an Emergency Use Authorization (EUA). This EUA will remain in effect (meaning this test can be used) for the duration of the COVID-19 declaration under Section 564(b)(1) of the Act, 21 U.S.C. section 360bbb-3(b)(1), unless the authorization is terminated or revoked.  Performed at [facility], [address] [place], [place] [address]   MRSA Next Gen by PCR, Nasal     Status: None   Collection Time: 11/19/23  2:11 PM   Specimen: Nasal Mucosa; Nasal Swab  Result Value Ref Range Status   MRSA by PCR Next Gen NOT DETECTED NOT DETECTED Final    Comment: (NOTE) The GeneXpert MRSA Assay (FDA approved for NASAL specimens only), is one component of a comprehensive MRSA colonization surveillance program. It is not intended to diagnose MRSA infection nor to guide or monitor treatment for MRSA infections. Test performance is not FDA approved in patients less than 31 years old. Performed at [facility], [address] [place], [place] [address]   Respiratory (~20 pathogens) panel by PCR     Status: None   Collection Time: 11/24/23 10:34 AM   Specimen: Nasopharyngeal Swab; Respiratory  Result Value Ref Range Status   Adenovirus NOT DETECTED NOT DETECTED Final   Coronavirus 229E NOT DETECTED NOT DETECTED Final    Comment: (NOTE) The Coronavirus on the Respiratory Panel, DOES NOT test for the novel  Coronavirus (2019 nCoV)    Coronavirus HKU1 NOT DETECTED NOT DETECTED Final   Coronavirus NL63 NOT DETECTED NOT DETECTED Final   Coronavirus OC43 NOT DETECTED NOT DETECTED Final   Metapneumovirus NOT DETECTED NOT DETECTED Final   Rhinovirus / Enterovirus NOT DETECTED NOT DETECTED Final   Influenza A NOT DETECTED NOT DETECTED Final   Influenza B NOT  DETECTED NOT DETECTED Final   Parainfluenza Virus 1 NOT DETECTED NOT DETECTED Final   Parainfluenza Virus 2 NOT DETECTED NOT DETECTED Final   Parainfluenza Virus 3 NOT DETECTED NOT DETECTED Final   Parainfluenza Virus 4 NOT DETECTED NOT DETECTED Final   Respiratory Syncytial Virus NOT DETECTED NOT DETECTED Final   Bordetella pertussis NOT DETECTED NOT DETECTED Final   Bordetella Parapertussis NOT DETECTED NOT DETECTED Final   Chlamydophila pneumoniae NOT DETECTED NOT DETECTED Final   Mycoplasma pneumoniae NOT DETECTED NOT DETECTED Final    Comment: Performed at [facility], [address] [place], [place] [address]      Radiology Studies: No results found.    LOS: 6 days    Elgin Lam, MD [facility] 11/26/2023, 6:34 PM   If 7PM-7AM, please contact night-coverage [email or web address]

## 2023-11-26 NOTE — Telephone Encounter (Signed)
 Please make the patient a Hosp follow up visit w RB or SG, or any APP if no slots available. Needs to be in about 1 month. Thanks.

## 2023-11-26 NOTE — Plan of Care (Deleted)

## 2023-11-26 NOTE — Telephone Encounter (Signed)
 Called and spoke with Pamela [name] and husband. Patient is still admitted and requested to be added to Dr. Lanny schedule as she has been seeing him in hospital. I advised she could call back when she gets discharged to schedule but husband wanted pt on his schedule. Scheduled pt for 8/27 and if pt is unable to make appt they will cancel. Nfn

## 2023-11-26 NOTE — Plan of Care (Signed)
  Problem: Education: Goal: Ability to describe self-care measures that may prevent or decrease complications (Diabetes Survival Skills Education) will improve 11/26/2023 1744 by Wyvonne Noreen LABOR, RN Outcome: Progressing 11/26/2023 1743 by Wyvonne Noreen LABOR, RN Outcome: Progressing   Problem: Education: Goal: Ability to describe self-care measures that may prevent or decrease complications (Diabetes Survival Skills Education) will improve 11/26/2023 1744 by Wyvonne Noreen LABOR, RN Outcome: Progressing 11/26/2023 1743 by Wyvonne Noreen LABOR, RN Outcome: Progressing   Problem: Health Behavior/Discharge Planning: Goal: Ability to manage health-related needs will improve 11/26/2023 1744 by Wyvonne Noreen LABOR, RN Outcome: Progressing 11/26/2023 1743 by Wyvonne Noreen LABOR, RN Outcome: Progressing   Problem: Metabolic: Goal: Ability to maintain appropriate glucose levels will improve 11/26/2023 1744 by Wyvonne Noreen LABOR, RN Outcome: Progressing 11/26/2023 1743 by Wyvonne Noreen LABOR, RN Outcome: Progressing   Problem: Nutritional: Goal: Maintenance of adequate nutrition will improve 11/26/2023 1744 by Wyvonne Noreen LABOR, RN Outcome: Progressing 11/26/2023 1743 by Wyvonne Noreen LABOR, RN Outcome: Progressing   Problem: Nutritional: Goal: Progress toward achieving an optimal weight will improve 11/26/2023 1744 by Wyvonne Noreen LABOR, RN Outcome: Progressing 11/26/2023 1743 by Wyvonne Noreen LABOR, RN Outcome: Progressing   Problem: Tissue Perfusion: Goal: Adequacy of tissue perfusion will improve 11/26/2023 1744 by Wyvonne Noreen LABOR, RN Outcome: Progressing 11/26/2023 1743 by Wyvonne Noreen LABOR, RN Outcome: Progressing

## 2023-11-27 ENCOUNTER — Other Ambulatory Visit: Payer: Self-pay

## 2023-11-27 DIAGNOSIS — J9601 Acute respiratory failure with hypoxia: Secondary | ICD-10-CM | POA: Diagnosis not present

## 2023-11-27 DIAGNOSIS — J700 Acute pulmonary manifestations due to radiation: Secondary | ICD-10-CM | POA: Diagnosis not present

## 2023-11-27 DIAGNOSIS — J441 Chronic obstructive pulmonary disease with (acute) exacerbation: Secondary | ICD-10-CM | POA: Diagnosis not present

## 2023-11-27 LAB — GLUCOSE, CAPILLARY
Glucose-Capillary: 44 mg/dL — CL (ref 70–99)
Glucose-Capillary: 95 mg/dL (ref 70–99)
Glucose-Capillary: 121 mg/dL — ABNORMAL HIGH (ref 70–99)
Glucose-Capillary: 143 mg/dL — ABNORMAL HIGH (ref 70–99)
Glucose-Capillary: 122 mg/dL — ABNORMAL HIGH (ref 70–99)
Glucose-Capillary: 204 mg/dL — ABNORMAL HIGH (ref 70–99)

## 2023-11-27 MED ORDER — INSULIN GLARGINE-YFGN 100 UNIT/ML ~~LOC~~ SOLN
30.0000 [IU] | Freq: Every day | SUBCUTANEOUS | Status: DC
  Administered 2023-11-27: 30 [IU] via SUBCUTANEOUS
  Filled 2023-11-27 (×2): qty 0.3

## 2023-11-27 MED ORDER — INSULIN ASPART 100 UNIT/ML IJ SOLN
10.0000 [IU] | Freq: Three times a day (TID) | INTRAMUSCULAR | Status: DC
  Administered 2023-11-27 (×2): 10 [IU] via SUBCUTANEOUS

## 2023-11-27 NOTE — Progress Notes (Signed)
 PROGRESS NOTE    Pamela [name]  FMW:[id number] DOB: 1951-03-11 DOA: 11/19/2023 PCP: Ransom Other, MD   Brief Narrative: Pamela [name] is a 73 y.o. female with a history of lung cancer status post chemoradiation, COPD, hyperlipidemia, hypertension, diabetes, CAD, renal transplant, heart failure with reduced EF.  Patient presented secondary to shortness of breath and wheezing with initial concern for postobstructive pneumonia with associated acute respiratory failure.  Patient initially managed with IV antibiotics without significant improvement.  CT imaging consistent with radiation pneumonitis and patient was managed on steroids instead.  Pulmonology was consulted for management as well.  Patient with improvement on current regimen of steroids and is now initiating a taper.   Assessment and Plan:  Acute respite failure with hypoxia Presumed secondary to radiation pneumonitis.  Initial concern for pneumonia on chest x-ray, however chest CT suggest likely radiation changes.  Patient requiring as much as 6 L/min supplemental oxygen. 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  Pulmonology consult this admission.  Patient started on Solu-Medrol  IV for management and has been transitioned to prednisone . - Pulmonology recommendations: Prednisone  taper starting with prednisone  40 mg for 4 weeks followed by taper of 10 mg weekly until back to home dose of 5 mg daily  COPD exacerbation Patient management as above.  Pulmonology added Yupelri  to her regimen. - Continue Yupelri , Brovana , DuoNebs  Chronic heart failure with recovered EF Stable.  No evidence that patient's current admission is related to acute heart failure. Transthoracic Echocardiogram this admission significant for a recovered LVEF of 55-60% and associated grade 1 diastolic  dysfunction. - Continue Coreg   History of stage IIIa non-small cell lung cancer Patient follows with oncology as an outpatient.  Patient is seen medical and radiation oncology and recently completed treatment.  Diabetes mellitus type 2 Well-controlled based on last hemoglobin A1c of 7.4% from May 2025.  Patient is on Lantus , Humalog, glimepiride  as an outpatient. - Decreased to insulin  glargine 30 units daily - Decrease to insulin  aspart 10 units 3 times daily with meals - Continue sliding scale insulin  - Discontinue glimepiride  on discharge  Primary hypertension - Continue amlodipine , Coreg   CAD Stable. For continue aspirin  and Lipitor  History of renal transplant Transplant from 2019. - Continue tacrolimus  and Rapamune   Hyponatremia Mild.  Asymptomatic.   DVT prophylaxis: Lovenox  Code Status:   Code Status: Full Code Family Communication: Husband at bedside Disposition Plan: Discharge home with home health once able to wean oxygen down to accommodate as an outpatient   Consultants:  Pulmonology  Procedures:  Transthoracic Echocardiogram  Antimicrobials: Vancomycin  Ceftriaxone  Cefepime  Azithromycin     Subjective: No specific concerns this morning.  Objective: BP (!) 147/72 (BP Location: Right Arm)   Pulse (!) 110   Temp 98.5 F (36.9 C)   Resp 18   Wt 75 kg   SpO2 90%   BMI 30.74 kg/m   Examination:  General exam: Appears calm and comfortable Respiratory system: Clear to auscultation. Respiratory effort normal. Cardiovascular system: S1 & S2 heard, RRR. No murmurs, rubs, gallops or clicks. Gastrointestinal system: Abdomen is nondistended, soft and nontender. Normal bowel sounds heard. Central nervous system: Alert and oriented. No focal neurological deficits. Psychiatry: Judgement and insight appear normal. Mood & affect appropriate.    Data Reviewed: I have personally reviewed following labs and imaging studies  CBC Lab Results  Component  Value Date   WBC 8.1 11/26/2023   RBC 3.44 (L) 11/26/2023   HGB 9.3 (L) 11/26/2023   HCT 30.1 (L) 11/26/2023   MCV 87.5 11/26/2023   MCH 27.0 11/26/2023   PLT 128 (L) 11/26/2023   MCHC 30.9 11/26/2023   RDW 14.5 11/26/2023   LYMPHSABS 0.1 (L) 11/17/2023   MONOABS 1.1 (H) 11/17/2023   EOSABS 0.0 11/17/2023   BASOSABS 0.0 11/17/2023     Last metabolic panel Lab Results  Component Value Date   NA 133 (L) 11/26/2023   K 4.5 11/26/2023   CL 96 (L) 11/26/2023   CO2 28 11/26/2023   BUN 25 (H) 11/26/2023   CREATININE 0.48 11/26/2023   GLUCOSE 172 (H) 11/26/2023   GFRNONAA >60 11/26/2023   GFRAA 6 (L) 11/24/2017   CALCIUM  8.5 (L) 11/26/2023   PHOS 4.6 11/24/2017   PROT 5.6 (L) 11/26/2023   ALBUMIN 2.6 (L) 11/26/2023   BILITOT 0.6 11/26/2023   ALKPHOS 78 11/26/2023   AST 28 11/26/2023   ALT 33 11/26/2023   ANIONGAP 9 11/26/2023    GFR: Estimated Creatinine Clearance: 58.7 mL/min (by C-G formula based on SCr of 0.48 mg/dL).  Recent Results (from the past 240 hours)  Resp panel by RT-PCR (RSV, Flu A&B, Covid) Anterior Nasal Swab     Status: None   Collection Time: 11/19/23 10:51 AM   Specimen: Anterior Nasal Swab  Result Value Ref Range Status   SARS Coronavirus 2 by RT PCR NEGATIVE NEGATIVE Final    Comment: (NOTE) SARS-CoV-2 target nucleic acids are NOT DETECTED.  The SARS-CoV-2 RNA is generally detectable in upper respiratory specimens during the acute phase of infection. The lowest concentration of SARS-CoV-2 viral copies this assay can detect is 138 copies/mL. A negative result does not preclude SARS-Cov-2 infection and should not be used as the sole basis for treatment or other patient management decisions. A negative result may occur with  improper specimen collection/handling, submission of specimen other than nasopharyngeal swab, presence of viral mutation(s) within the areas targeted by this assay, and inadequate number of viral copies(<138 copies/mL). A  negative result must be combined with clinical observations, patient history, and epidemiological information. The expected result is Negative.  Fact Sheet for Patients:  BloggerCourse.com  Fact Sheet for Healthcare Providers:  SeriousBroker.it  This test is no t yet approved or cleared by the [place]  FDA and  has been authorized for detection and/or diagnosis of SARS-CoV-2 by FDA under an Emergency Use Authorization (EUA). This EUA will remain  in effect (meaning this test can be used) for the duration of the COVID-19 declaration under Section 564(b)(1) of the Act, 21 U.S.C.section 360bbb-3(b)(1), unless the authorization is terminated  or revoked sooner.       Influenza A by PCR NEGATIVE NEGATIVE Final   Influenza B by PCR NEGATIVE NEGATIVE Final    Comment: (NOTE) The Xpert Xpress SARS-CoV-2/FLU/RSV plus assay is intended as an aid in the diagnosis of influenza from Nasopharyngeal swab specimens and should not be used as a sole basis for treatment. Nasal washings and aspirates are unacceptable for Xpert Xpress SARS-CoV-2/FLU/RSV testing.  Fact Sheet for Patients: BloggerCourse.com  Fact Sheet for Healthcare Providers: SeriousBroker.it  This test is not yet approved or cleared by the [place]  FDA and has been authorized for detection and/or diagnosis of SARS-CoV-2 by FDA under an Emergency Use Authorization (EUA). This EUA will remain in effect (meaning this test can be used) for the duration of the  COVID-19 declaration under Section 564(b)(1) of the Act, 21 U.S.C. section 360bbb-3(b)(1), unless the authorization is terminated or revoked.     Resp Syncytial Virus by PCR NEGATIVE NEGATIVE Final    Comment: (NOTE) Fact Sheet for Patients: BloggerCourse.com  Fact Sheet for Healthcare  Providers: SeriousBroker.it  This test is not yet approved or cleared by the [place]  FDA and has been authorized for detection and/or diagnosis of SARS-CoV-2 by FDA under an Emergency Use Authorization (EUA). This EUA will remain in effect (meaning this test can be used) for the duration of the COVID-19 declaration under Section 564(b)(1) of the Act, 21 U.S.C. section 360bbb-3(b)(1), unless the authorization is terminated or revoked.  Performed at [facility], [address] [place], [place] [address]   MRSA Next Gen by PCR, Nasal     Status: None   Collection Time: 11/19/23  2:11 PM   Specimen: Nasal Mucosa; Nasal Swab  Result Value Ref Range Status   MRSA by PCR Next Gen NOT DETECTED NOT DETECTED Final    Comment: (NOTE) The GeneXpert MRSA Assay (FDA approved for NASAL specimens only), is one component of a comprehensive MRSA colonization surveillance program. It is not intended to diagnose MRSA infection nor to guide or monitor treatment for MRSA infections. Test performance is not FDA approved in patients less than 88 years old. Performed at [facility], [address] [place], [place] [address]   Respiratory (~20 pathogens) panel by PCR     Status: None   Collection Time: 11/24/23 10:34 AM   Specimen: Nasopharyngeal Swab; Respiratory  Result Value Ref Range Status   Adenovirus NOT DETECTED NOT DETECTED Final   Coronavirus 229E NOT DETECTED NOT DETECTED Final    Comment: (NOTE) The Coronavirus on the Respiratory Panel, DOES NOT test for the novel  Coronavirus (2019 nCoV)    Coronavirus HKU1 NOT DETECTED NOT DETECTED Final   Coronavirus NL63 NOT DETECTED NOT DETECTED Final   Coronavirus OC43 NOT DETECTED NOT DETECTED Final   Metapneumovirus NOT DETECTED NOT DETECTED Final   Rhinovirus / Enterovirus NOT DETECTED NOT DETECTED Final   Influenza A NOT DETECTED NOT DETECTED Final   Influenza B NOT  DETECTED NOT DETECTED Final   Parainfluenza Virus 1 NOT DETECTED NOT DETECTED Final   Parainfluenza Virus 2 NOT DETECTED NOT DETECTED Final   Parainfluenza Virus 3 NOT DETECTED NOT DETECTED Final   Parainfluenza Virus 4 NOT DETECTED NOT DETECTED Final   Respiratory Syncytial Virus NOT DETECTED NOT DETECTED Final   Bordetella pertussis NOT DETECTED NOT DETECTED Final   Bordetella Parapertussis NOT DETECTED NOT DETECTED Final   Chlamydophila pneumoniae NOT DETECTED NOT DETECTED Final   Mycoplasma pneumoniae NOT DETECTED NOT DETECTED Final    Comment: Performed at [facility], [address], [place], [place] [address]      Radiology Studies: No results found.    LOS: 7 days    Elgin Lam, MD [facility] 11/27/2023, 2:19 PM   If 7PM-7AM, please contact night-coverage [email or web address]

## 2023-11-27 NOTE — Plan of Care (Signed)

## 2023-11-27 NOTE — Progress Notes (Signed)
 Hypoglycemic Event  CBG: 44  Treatment: 8 oz juice/soda  Symptoms: Sweaty and Shaky  Follow-up CBG: Time:0345 CBG Result:95  Possible Reasons for Event: Unknown  Comments/MD notified:Abigail Andrez, NP    Littleton Haub MARLA Glatter

## 2023-11-27 NOTE — Inpatient Diabetes Management (Signed)
 Inpatient Diabetes Program Recommendations  AACE/ADA: New Consensus Statement on Inpatient Glycemic Control (2015)  Target Ranges:  Prepandial:   less than 140 mg/dL      Peak postprandial:   less than 180 mg/dL (1-2 hours)      Critically ill patients:  140 - 180 mg/dL   Lab Results  Component Value Date   GLUCAP 121 (H) 11/27/2023   HGBA1C 7.4 (H) 09/30/2023    Review of Glycemic Control  Latest Reference Range & Units 11/25/23 23:31 11/26/23 00:01 11/26/23 04:25 11/26/23 07:21 11/26/23 11:35 11/26/23 16:20 11/26/23 21:19 11/27/23 03:24 11/27/23 03:49 11/27/23 07:13  Glucose-Capillary 70 - 99 mg/dL 40 (LL) 817 (H) 829 (H) 150 (H) 228 (H) 211 (H) 212 (H) 44 (LL) 95 121 (H)  (LL): Data is critically low (H): Data is abnormally high  Consider decreasing correction insulin  to Novolog  0-15 units TID to avoid over correction and hypoglycemia.  Thank you, Wyvonna Pinal, MSN, CDCES Diabetes Coordinator Inpatient Diabetes Program [phone] (team pager from 8a-5p)

## 2023-11-27 NOTE — Plan of Care (Signed)
  Problem: Education: Goal: Ability to describe self-care measures that may prevent or decrease complications (Diabetes Survival Skills Education) will improve Outcome: Progressing   Problem: Skin Integrity: Goal: Risk for impaired skin integrity will decrease Outcome: Progressing   

## 2023-11-27 NOTE — Progress Notes (Signed)
 Mobility Specialist - Progress Note   11/27/23 1036  Mobility  Activity Stood at bedside  Level of Assistance Modified independent, requires aide device or extra time  Activity Response Tolerated well  Mobility Referral Yes  Mobility visit 1 Mobility  Mobility Specialist Start Time (ACUTE ONLY) 1018  Mobility Specialist Stop Time (ACUTE ONLY) 1034  Mobility Specialist Time Calculation (min) (ACUTE ONLY) 16 min   Nurse requested Mobility Specialist to perform oxygen saturation test with pt which includes removing pt from oxygen both at rest and while ambulating.  Below are the results from that testing.     Patient Saturations on Room Air while Ambulating = sp02 80% .   Patient Saturations on 6 Liters of oxygen while Ambulating = sp02 92%  At end of testing pt left in room on 5  Liters of oxygen.  Reported results to nurse. Pt only able to stand at EOB. Once standing, pt desat to 80% on RA. Bumped to 6L allowing SpO2 to come up to 92% (~73min). No complaints during session. Pt to EOB after session with all needs met.    During mobility: 115 HR, 84% SpO2  Post-mobility: 109 HR, 92% SPO2  Chief Technology Officer

## 2023-11-28 ENCOUNTER — Telehealth: Payer: Self-pay | Admitting: Pulmonary Disease

## 2023-11-28 ENCOUNTER — Encounter (HOSPITAL_COMMUNITY): Payer: Self-pay | Admitting: Internal Medicine

## 2023-11-28 DIAGNOSIS — J9601 Acute respiratory failure with hypoxia: Secondary | ICD-10-CM | POA: Diagnosis not present

## 2023-11-28 DIAGNOSIS — J700 Acute pulmonary manifestations due to radiation: Secondary | ICD-10-CM | POA: Diagnosis not present

## 2023-11-28 DIAGNOSIS — J441 Chronic obstructive pulmonary disease with (acute) exacerbation: Secondary | ICD-10-CM | POA: Diagnosis not present

## 2023-11-28 LAB — GLUCOSE, CAPILLARY
Glucose-Capillary: 52 mg/dL — ABNORMAL LOW (ref 70–99)
Glucose-Capillary: 104 mg/dL — ABNORMAL HIGH (ref 70–99)
Glucose-Capillary: 95 mg/dL (ref 70–99)
Glucose-Capillary: 121 mg/dL — ABNORMAL HIGH (ref 70–99)
Glucose-Capillary: 271 mg/dL — ABNORMAL HIGH (ref 70–99)
Glucose-Capillary: 243 mg/dL — ABNORMAL HIGH (ref 70–99)
Glucose-Capillary: 269 mg/dL — ABNORMAL HIGH (ref 70–99)
Glucose-Capillary: 298 mg/dL — ABNORMAL HIGH (ref 70–99)

## 2023-11-28 LAB — FUNGITELL BETA-D-GLUCAN

## 2023-11-28 MED ORDER — INSULIN ASPART 100 UNIT/ML IJ SOLN
7.0000 [IU] | Freq: Three times a day (TID) | INTRAMUSCULAR | Status: DC
  Administered 2023-11-28 – 2023-12-02 (×12): 7 [IU] via SUBCUTANEOUS

## 2023-11-28 MED ORDER — INSULIN GLARGINE-YFGN 100 UNIT/ML ~~LOC~~ SOLN
20.0000 [IU] | Freq: Every day | SUBCUTANEOUS | Status: DC
  Administered 2023-11-28 – 2023-12-02 (×5): 20 [IU] via SUBCUTANEOUS
  Filled 2023-11-28 (×5): qty 0.2

## 2023-11-28 NOTE — Progress Notes (Signed)
 Process already in place to see her as outpatient  Pulmonary will sign off

## 2023-11-28 NOTE — Plan of Care (Signed)

## 2023-11-28 NOTE — Progress Notes (Signed)
 Physical Therapy Treatment Patient Details Name: Pamela [name] MRN: [id number] DOB: 01-Sep-1950 Today's Date: 11/28/2023   History of Present Illness Pamela [name] is a 73 y.o. female admitted with acute respiratory failure with hypoxia. PMH: left lung cancer recently completed chemotherapy mid June 2025, COPD, HTN, dyslipidemia, diabetes mellitus, CAD, anemia, renal transplant followed [facility], [facility]    PT Comments  Pt ambulated 24' with RW, distance limited by 4/4 dyspnea, SpO2 78% on 6L O2 walking, HR 125. Ongoing verbal cues for pursed lip breathing were required. During seated recovery SpO2 was 90% on 8L O2, then 87% on 6L O2, HR 107. Reviewed seated BLE exercises. Pt puts forth good effort.     If plan is discharge home, recommend the following: A little help with bathing/dressing/bathroom;Assistance with cooking/housework;Assist for transportation;Help with stairs or ramp for entrance   Can travel by private vehicle        Equipment Recommendations  None recommended by PT    Recommendations for Other Services       Precautions / Restrictions Precautions Precautions: Fall Recall of Precautions/Restrictions: Intact Precaution/Restrictions Comments: monitor O2 Restrictions Weight Bearing Restrictions Per Provider Order: No     Mobility  Bed Mobility Overal bed mobility: Modified Independent       Supine to sit: Modified independent (Device/Increase time), HOB elevated, Used rails          Transfers Overall transfer level: Needs assistance Equipment used: Rolling walker (2 wheels) Transfers: Sit to/from Stand Sit to Stand: Supervision           General transfer comment: VCs hand placement    Ambulation/Gait Ambulation/Gait assistance: Contact guard assist Gait Distance (Feet): 24 Feet Assistive device: Rolling walker (2 wheels) Gait Pattern/deviations: Step-through pattern, Decreased stride length Gait velocity: decr     General Gait Details:  steady, no loss of balance, SpO2 78% on 6L O2 and HR 125 walking, 90% on 8L O2 for recovery, 87% on 6L O2 and HR 107 during recovery. Frequent verbal cues for pursed lip breathing   Stairs             Wheelchair Mobility     Tilt Bed    Modified Rankin (Stroke Patients Only)       Balance     Sitting balance-Leahy Scale: Good       Standing balance-Leahy Scale: Fair                              Hotel manager: No apparent difficulties  Cognition Arousal: Alert Behavior During Therapy: WFL for tasks assessed/performed   PT - Cognitive impairments: No apparent impairments                         Following commands: Intact      Cueing    Exercises General Exercises - Lower Extremity Long Arc Quad: AROM, Both, 10 reps, Seated Hip Flexion/Marching: AROM, Both, 5 reps, Seated    General Comments        Pertinent Vitals/Pain Pain Assessment Pain Assessment: No/denies pain    Home Living                          Prior Function            PT Goals (current goals can now be found in the care plan section) Acute Rehab PT Goals Patient Stated  Goal: breathe better, play bingo PT Goal Formulation: With patient/family Time For Goal Achievement: 12/05/23 Potential to Achieve Goals: Good Progress towards PT goals: Progressing toward goals    Frequency    Min 3X/week      PT Plan      Co-evaluation              AM-PAC PT 6 Clicks Mobility   Outcome Measure  Help needed turning from your back to your side while in a flat bed without using bedrails?: None Help needed moving from lying on your back to sitting on the side of a flat bed without using bedrails?: None Help needed moving to and from a bed to a chair (including a wheelchair)?: None Help needed standing up from a chair using your arms (e.g., wheelchair or bedside chair)?: None Help needed to walk in hospital room?: A  Little Help needed climbing 3-5 steps with a railing? : A Little 6 Click Score: 22    End of Session Equipment Utilized During Treatment: Gait belt;Oxygen Activity Tolerance: Patient limited by fatigue Patient left: with call bell/phone within reach;with family/visitor present;in chair Nurse Communication: Mobility status;Other (comment) (hypoxia with activity) PT Visit Diagnosis: Other abnormalities of gait and mobility (R26.89);Difficulty in walking, not elsewhere classified (R26.2)     Time: 8761-8696 PT Time Calculation (min) (ACUTE ONLY): 25 min  Charges:    $Gait Training: 8-22 mins $Therapeutic Exercise: 8-22 mins PT General Charges $$ ACUTE PT VISIT: 1 Visit                     Sylvan Nest Kistler PT 11/28/2023  Acute Rehabilitation Services  Office [phone]

## 2023-11-28 NOTE — Inpatient Diabetes Management (Signed)
 Inpatient Diabetes Program Recommendations  AACE/ADA: New Consensus Statement on Inpatient Glycemic Control (2015)  Target Ranges:  Prepandial:   less than 140 mg/dL      Peak postprandial:   less than 180 mg/dL (1-2 hours)      Critically ill patients:  140 - 180 mg/dL   Lab Results  Component Value Date   GLUCAP 121 (H) 11/28/2023   HGBA1C 7.4 (H) 09/30/2023    Review of Glycemic Control  Latest Reference Range & Units 11/28/23 05:22  Glucose-Capillary 70 - 99 mg/dL 52 (L)  (L): Data is abnormally low   Inpatient Diabetes Program Recommendations:    Has has a few episodes of hypoglycemia.  Please consider decreasing basal insulin .    Semglee  20 units every day  Thank you, Wyvonna Pinal, MSN, CDCES Diabetes Coordinator Inpatient Diabetes Program [phone] (team pager from 8a-5p)

## 2023-11-28 NOTE — Progress Notes (Signed)
 Hypoglycemic Event  CBG: 52  Treatment: 8 oz juice/soda  Symptoms: Sweaty and Shaky  Follow-up CBG: Upfz:9444 CBG Result:95  Possible Reasons for Event: Unknown  Comments/MD notified: Lavanda Horns NP    Yailene Badia K Ziza Hastings

## 2023-11-28 NOTE — Progress Notes (Signed)
 SATURATION QUALIFICATIONS: (This note is used to comply with regulatory documentation for home oxygen)    Patient Saturations on 6 Liters of oxygen while Ambulating = 78%  Please briefly explain why patient needs home oxygen: to maintain appropriate SpO2 levels.   Sylvan Nest Kistler PT 11/28/2023  Acute Rehabilitation Services  Office [phone]

## 2023-11-28 NOTE — Progress Notes (Signed)
 PROGRESS NOTE    TALEEYA [name]  FMW:[id number] DOB: 06-19-1950 DOA: 11/19/2023 PCP: Ransom Other, MD   Brief Narrative: Pamela [name] is a 73 y.o. female with a history of lung cancer status post chemoradiation, COPD, hyperlipidemia, hypertension, diabetes, CAD, renal transplant, heart failure with reduced EF.  Patient presented secondary to shortness of breath and wheezing with initial concern for postobstructive pneumonia with associated acute respiratory failure.  Patient initially managed with IV antibiotics without significant improvement.  CT imaging consistent with radiation pneumonitis and patient was managed on steroids instead.  Pulmonology was consulted for management as well.  Patient with improvement on current regimen of steroids and is now initiating a taper.   Assessment and Plan:  Acute respite failure with hypoxia Presumed secondary to radiation pneumonitis.  Initial concern for pneumonia on chest x-ray, however chest CT suggest likely radiation changes.  Patient requiring as much as 6 L/min supplemental oxygen. 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  Pulmonology consult this admission.  Patient started on Solu-Medrol  IV for management and has been transitioned to prednisone . - Pulmonology recommendations: Prednisone  taper starting with prednisone  40 mg for 4 weeks followed by taper of 10 mg weekly until back to home dose of 5 mg daily  COPD exacerbation Patient management as above.  Pulmonology added Yupelri  to her regimen. - Continue Yupelri , Brovana , DuoNebs  Chronic heart failure with recovered EF Stable.  No evidence that patient's current admission is related to acute heart failure. Transthoracic Echocardiogram this admission significant for a recovered LVEF of 55-60% and associated grade 1 diastolic  dysfunction. - Continue Coreg   History of stage IIIa non-small cell lung cancer Patient follows with oncology as an outpatient.  Patient is seen medical and radiation oncology and recently completed treatment.  Diabetes mellitus type 2 Well-controlled based on last hemoglobin A1c of 7.4% from May 2025.  Patient is on Lantus , Humalog, glimepiride  as an outpatient. - Decreased to insulin  glargine 20 units daily - Decrease to insulin  aspart 7 units 3 times daily with meals - Continue sliding scale insulin  - Discontinue glimepiride  on discharge  Primary hypertension - Continue amlodipine , Coreg   CAD Stable. For continue aspirin  and Lipitor  History of renal transplant Transplant from 2019. - Continue tacrolimus  and Rapamune   Hyponatremia Mild.  Asymptomatic.   DVT prophylaxis: Lovenox  Code Status:   Code Status: Full Code Family Communication: Husband at bedside Disposition Plan: Discharge home with home health once able to wean oxygen down to accommodate as an outpatient   Consultants:  Pulmonology  Procedures:  Transthoracic Echocardiogram  Antimicrobials: Vancomycin  Ceftriaxone  Cefepime  Azithromycin     Subjective: Did not do well off of oxygen yesterday. No other concerns.  Objective: BP (!) 144/58 (BP Location: Right Arm)   Pulse (!) 102   Temp 98.9 F (37.2 C) (Oral)   Resp 16   Ht 5' 1.5 (1.562 m)   Wt 75 kg   SpO2 94%   BMI 30.74 kg/m   Examination:  General exam: Appears calm and comfortable Respiratory system: Mild wheezing L>R. Respiratory effort normal. Cardiovascular system: S1 & S2 heard, RRR. No murmurs, rubs, gallops or clicks. Gastrointestinal system: Abdomen is nondistended, soft and nontender. Normal bowel sounds heard. Central nervous system: Alert and oriented. No focal neurological deficits. Psychiatry: Judgement and insight appear normal. Mood & affect appropriate.    Data  Reviewed: I have personally reviewed following labs  and imaging studies  CBC Lab Results  Component Value Date   WBC 8.1 11/26/2023   RBC 3.44 (L) 11/26/2023   HGB 9.3 (L) 11/26/2023   HCT 30.1 (L) 11/26/2023   MCV 87.5 11/26/2023   MCH 27.0 11/26/2023   PLT 128 (L) 11/26/2023   MCHC 30.9 11/26/2023   RDW 14.5 11/26/2023   LYMPHSABS 0.1 (L) 11/17/2023   MONOABS 1.1 (H) 11/17/2023   EOSABS 0.0 11/17/2023   BASOSABS 0.0 11/17/2023     Last metabolic panel Lab Results  Component Value Date   NA 133 (L) 11/26/2023   K 4.5 11/26/2023   CL 96 (L) 11/26/2023   CO2 28 11/26/2023   BUN 25 (H) 11/26/2023   CREATININE 0.48 11/26/2023   GLUCOSE 172 (H) 11/26/2023   GFRNONAA >60 11/26/2023   GFRAA 6 (L) 11/24/2017   CALCIUM  8.5 (L) 11/26/2023   PHOS 4.6 11/24/2017   PROT 5.6 (L) 11/26/2023   ALBUMIN 2.6 (L) 11/26/2023   BILITOT 0.6 11/26/2023   ALKPHOS 78 11/26/2023   AST 28 11/26/2023   ALT 33 11/26/2023   ANIONGAP 9 11/26/2023    GFR: Estimated Creatinine Clearance: 58.7 mL/min (by C-G formula based on SCr of 0.48 mg/dL).  Recent Results (from the past 240 hours)  Resp panel by RT-PCR (RSV, Flu A&B, Covid) Anterior Nasal Swab     Status: None   Collection Time: 11/19/23 10:51 AM   Specimen: Anterior Nasal Swab  Result Value Ref Range Status   SARS Coronavirus 2 by RT PCR NEGATIVE NEGATIVE Final    Comment: (NOTE) SARS-CoV-2 target nucleic acids are NOT DETECTED.  The SARS-CoV-2 RNA is generally detectable in upper respiratory specimens during the acute phase of infection. The lowest concentration of SARS-CoV-2 viral copies this assay can detect is 138 copies/mL. A negative result does not preclude SARS-Cov-2 infection and should not be used as the sole basis for treatment or other patient management decisions. A negative result may occur with  improper specimen collection/handling, submission of specimen other than nasopharyngeal swab, presence of viral mutation(s) within the areas targeted by this assay, and  inadequate number of viral copies(<138 copies/mL). A negative result must be combined with clinical observations, patient history, and epidemiological information. The expected result is Negative.  Fact Sheet for Patients:  BloggerCourse.com  Fact Sheet for Healthcare Providers:  SeriousBroker.it  This test is no t yet approved or cleared by the [place]  FDA and  has been authorized for detection and/or diagnosis of SARS-CoV-2 by FDA under an Emergency Use Authorization (EUA). This EUA will remain  in effect (meaning this test can be used) for the duration of the COVID-19 declaration under Section 564(b)(1) of the Act, 21 U.S.C.section 360bbb-3(b)(1), unless the authorization is terminated  or revoked sooner.       Influenza A by PCR NEGATIVE NEGATIVE Final   Influenza B by PCR NEGATIVE NEGATIVE Final    Comment: (NOTE) The Xpert Xpress SARS-CoV-2/FLU/RSV plus assay is intended as an aid in the diagnosis of influenza from Nasopharyngeal swab specimens and should not be used as a sole basis for treatment. Nasal washings and aspirates are unacceptable for Xpert Xpress SARS-CoV-2/FLU/RSV testing.  Fact Sheet for Patients: BloggerCourse.com  Fact Sheet for Healthcare Providers: SeriousBroker.it  This test is not yet approved or cleared by the [place]  FDA and has been authorized for detection and/or diagnosis of SARS-CoV-2 by FDA under an Emergency Use Authorization (EUA). This EUA  will remain in effect (meaning this test can be used) for the duration of the COVID-19 declaration under Section 564(b)(1) of the Act, 21 U.S.C. section 360bbb-3(b)(1), unless the authorization is terminated or revoked.     Resp Syncytial Virus by PCR NEGATIVE NEGATIVE Final    Comment: (NOTE) Fact Sheet for Patients: BloggerCourse.com  Fact Sheet for Healthcare  Providers: SeriousBroker.it  This test is not yet approved or cleared by the [place]  FDA and has been authorized for detection and/or diagnosis of SARS-CoV-2 by FDA under an Emergency Use Authorization (EUA). This EUA will remain in effect (meaning this test can be used) for the duration of the COVID-19 declaration under Section 564(b)(1) of the Act, 21 U.S.C. section 360bbb-3(b)(1), unless the authorization is terminated or revoked.  Performed at [facility], [address], [place], [place] [address]   MRSA Next Gen by PCR, Nasal     Status: None   Collection Time: 11/19/23  2:11 PM   Specimen: Nasal Mucosa; Nasal Swab  Result Value Ref Range Status   MRSA by PCR Next Gen NOT DETECTED NOT DETECTED Final    Comment: (NOTE) The GeneXpert MRSA Assay (FDA approved for NASAL specimens only), is one component of a comprehensive MRSA colonization surveillance program. It is not intended to diagnose MRSA infection nor to guide or monitor treatment for MRSA infections. Test performance is not FDA approved in patients less than 92 years old. Performed at [facility], [address], [place], [place] [address]   Respiratory (~20 pathogens) panel by PCR     Status: None   Collection Time: 11/24/23 10:34 AM   Specimen: Nasopharyngeal Swab; Respiratory  Result Value Ref Range Status   Adenovirus NOT DETECTED NOT DETECTED Final   Coronavirus 229E NOT DETECTED NOT DETECTED Final    Comment: (NOTE) The Coronavirus on the Respiratory Panel, DOES NOT test for the novel  Coronavirus (2019 nCoV)    Coronavirus HKU1 NOT DETECTED NOT DETECTED Final   Coronavirus NL63 NOT DETECTED NOT DETECTED Final   Coronavirus OC43 NOT DETECTED NOT DETECTED Final   Metapneumovirus NOT DETECTED NOT DETECTED Final   Rhinovirus / Enterovirus NOT DETECTED NOT DETECTED Final   Influenza A NOT DETECTED NOT DETECTED Final   Influenza B NOT  DETECTED NOT DETECTED Final   Parainfluenza Virus 1 NOT DETECTED NOT DETECTED Final   Parainfluenza Virus 2 NOT DETECTED NOT DETECTED Final   Parainfluenza Virus 3 NOT DETECTED NOT DETECTED Final   Parainfluenza Virus 4 NOT DETECTED NOT DETECTED Final   Respiratory Syncytial Virus NOT DETECTED NOT DETECTED Final   Bordetella pertussis NOT DETECTED NOT DETECTED Final   Bordetella Parapertussis NOT DETECTED NOT DETECTED Final   Chlamydophila pneumoniae NOT DETECTED NOT DETECTED Final   Mycoplasma pneumoniae NOT DETECTED NOT DETECTED Final    Comment: Performed at [facility], [address] [place], [place] [address]      Radiology Studies: No results found.    LOS: 8 days    Elgin Lam, MD [facility] 11/28/2023, 1:09 PM   If 7PM-7AM, please contact night-coverage [email or web address]

## 2023-11-29 ENCOUNTER — Inpatient Hospital Stay (HOSPITAL_COMMUNITY)

## 2023-11-29 DIAGNOSIS — J700 Acute pulmonary manifestations due to radiation: Secondary | ICD-10-CM | POA: Diagnosis not present

## 2023-11-29 DIAGNOSIS — J441 Chronic obstructive pulmonary disease with (acute) exacerbation: Secondary | ICD-10-CM | POA: Diagnosis not present

## 2023-11-29 DIAGNOSIS — J9601 Acute respiratory failure with hypoxia: Secondary | ICD-10-CM | POA: Diagnosis not present

## 2023-11-29 LAB — GLUCOSE, CAPILLARY
Glucose-Capillary: 122 mg/dL — ABNORMAL HIGH (ref 70–99)
Glucose-Capillary: 92 mg/dL (ref 70–99)
Glucose-Capillary: 323 mg/dL — ABNORMAL HIGH (ref 70–99)
Glucose-Capillary: 253 mg/dL — ABNORMAL HIGH (ref 70–99)
Glucose-Capillary: 259 mg/dL — ABNORMAL HIGH (ref 70–99)

## 2023-11-29 NOTE — Plan of Care (Signed)
  Problem: Education: Goal: Ability to describe self-care measures that may prevent or decrease complications (Diabetes Survival Skills Education) will improve Outcome: Progressing Goal: Individualized Educational Video(s) Outcome: Progressing   Problem: Coping: Goal: Ability to adjust to condition or change in health will improve Outcome: Progressing   Problem: Fluid Volume: Goal: Ability to maintain a balanced intake and output will improve Outcome: Progressing   Problem: Health Behavior/Discharge Planning: Goal: Ability to identify and utilize available resources and services will improve Outcome: Progressing Goal: Ability to manage health-related needs will improve Outcome: Progressing   Problem: Metabolic: Goal: Ability to maintain appropriate glucose levels will improve Outcome: Progressing   Problem: Nutritional: Goal: Maintenance of adequate nutrition will improve Outcome: Progressing Goal: Progress toward achieving an optimal weight will improve Outcome: Progressing   Problem: Skin Integrity: Goal: Risk for impaired skin integrity will decrease Outcome: Progressing   Problem: Tissue Perfusion: Goal: Adequacy of tissue perfusion will improve Outcome: Progressing   Problem: Education: Goal: Knowledge of General Education information will improve Description: Including pain rating scale, medication(s)/side effects and non-pharmacologic comfort measures Outcome: Progressing   Problem: Health Behavior/Discharge Planning: Goal: Ability to manage health-related needs will improve Outcome: Progressing   Problem: Clinical Measurements: Goal: Ability to maintain clinical measurements within normal limits will improve Outcome: Progressing Goal: Will remain free from infection Outcome: Progressing Goal: Diagnostic test results will improve Outcome: Progressing Goal: Respiratory complications will improve Outcome: Progressing Goal: Cardiovascular complication will  be avoided Outcome: Progressing   Problem: Nutrition: Goal: Adequate nutrition will be maintained Outcome: Progressing   Problem: Coping: Goal: Level of anxiety will decrease Outcome: Progressing   Problem: Elimination: Goal: Will not experience complications related to bowel motility Outcome: Progressing Goal: Will not experience complications related to urinary retention Outcome: Progressing   Problem: Pain Managment: Goal: General experience of comfort will improve and/or be controlled Outcome: Progressing   Problem: Safety: Goal: Ability to remain free from injury will improve Outcome: Progressing   Problem: Skin Integrity: Goal: Risk for impaired skin integrity will decrease Outcome: Progressing   Problem: Activity: Goal: Risk for activity intolerance will decrease Outcome: Not Progressing

## 2023-11-29 NOTE — Progress Notes (Signed)
 PROGRESS NOTE    Pamela [name]  FMW:[id number] DOB: 06/11/1950 DOA: 11/19/2023 PCP: Ransom Other, MD   Brief Narrative: Pamela [name] is a 73 y.o. female with a history of lung cancer status post chemoradiation, COPD, hyperlipidemia, hypertension, diabetes, CAD, renal transplant, heart failure with reduced EF.  Patient presented secondary to shortness of breath and wheezing with initial concern for postobstructive pneumonia with associated acute respiratory failure.  Patient initially managed with IV antibiotics without significant improvement.  CT imaging consistent with radiation pneumonitis and patient was managed on steroids instead.  Pulmonology was consulted for management as well.  Patient with improvement on current regimen of steroids and is now initiating a taper.   Assessment and Plan:  Acute respite failure with hypoxia Presumed secondary to radiation pneumonitis.  Initial concern for pneumonia on chest x-ray, however chest CT suggest likely radiation changes.  Patient requiring as much as 6 L/min supplemental oxygen. 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  Pulmonology consult this admission.  Patient started on Solu-Medrol  IV for management and has been transitioned to prednisone . - Pulmonology recommendations: Prednisone  taper starting with prednisone  40 mg (first dose 7/17) for 4 weeks followed by taper of 10 mg weekly until back to home dose of 5 mg daily  COPD exacerbation Patient management as above.  Pulmonology added Yupelri  to her regimen. Exacerbation appears to be resolved. - Continue Yupelri , Brovana , DuoNebs  Chronic heart failure with recovered EF Stable.  No evidence that patient's current  admission is related to acute heart failure. Transthoracic Echocardiogram this admission significant for a recovered LVEF of 55-60% and associated grade 1 diastolic dysfunction. - Continue Coreg   History of stage IIIa non-small cell lung cancer Patient follows with oncology as an outpatient.  Patient is seen medical and radiation oncology and recently completed treatment.  Diabetes mellitus type 2 Well-controlled based on last hemoglobin A1c of 7.4% from May 2025.  Patient is on Lantus , Humalog, glimepiride  as an outpatient. Patient with 17 units of blood sugar coverage yesterday. - Continue insulin  glargine 20 units daily - Continue insulin  aspart 7 units 3 times daily with meals - Continue sliding scale insulin  - Discontinue glimepiride  on discharge  Primary hypertension - Continue amlodipine , Coreg   CAD Stable. For continue aspirin  and Lipitor  History of renal transplant Transplant from 2019. - Continue tacrolimus  and Rapamune   Hyponatremia Mild.  Asymptomatic.   DVT prophylaxis: Lovenox  Code Status:   Code Status: Full Code Family Communication: Husband at bedside Disposition Plan: Discharge home with home health once able to wean oxygen down to accommodate as an outpatient   Consultants:  Pulmonology  Procedures:  Transthoracic Echocardiogram  Antimicrobials: Vancomycin  Ceftriaxone  Cefepime  Azithromycin     Subjective: No new concerns this morning. Did not tolerate ambulation well secondary to significant hypoxia.  Objective: BP (!) 157/80 (BP Location: Right Arm)   Pulse 93   Temp 98.2 F (36.8 C)   Resp 18   Ht 5' 1.5 (1.562 m)   Wt 75.1 kg   SpO2 92%   BMI 30.78 kg/m   Examination:  General exam: Appears calm and comfortable Respiratory system: Clear to auscultation. Respiratory effort normal. Cardiovascular system: S1 & S2 heard, RRR. No murmurs, rubs, gallops or clicks. Gastrointestinal system:  Abdomen is nondistended, soft and nontender.  Normal bowel sounds heard. Central nervous system: Alert and oriented. No focal neurological deficits. Psychiatry: Judgement and insight appear normal. Mood & affect appropriate.    Data Reviewed: I have personally reviewed following labs and imaging studies  CBC Lab Results  Component Value Date   WBC 8.1 11/26/2023   RBC 3.44 (L) 11/26/2023   HGB 9.3 (L) 11/26/2023   HCT 30.1 (L) 11/26/2023   MCV 87.5 11/26/2023   MCH 27.0 11/26/2023   PLT 128 (L) 11/26/2023   MCHC 30.9 11/26/2023   RDW 14.5 11/26/2023   LYMPHSABS 0.1 (L) 11/17/2023   MONOABS 1.1 (H) 11/17/2023   EOSABS 0.0 11/17/2023   BASOSABS 0.0 11/17/2023     Last metabolic panel Lab Results  Component Value Date   NA 133 (L) 11/26/2023   K 4.5 11/26/2023   CL 96 (L) 11/26/2023   CO2 28 11/26/2023   BUN 25 (H) 11/26/2023   CREATININE 0.48 11/26/2023   GLUCOSE 172 (H) 11/26/2023   GFRNONAA >60 11/26/2023   GFRAA 6 (L) 11/24/2017   CALCIUM  8.5 (L) 11/26/2023   PHOS 4.6 11/24/2017   PROT 5.6 (L) 11/26/2023   ALBUMIN 2.6 (L) 11/26/2023   BILITOT 0.6 11/26/2023   ALKPHOS 78 11/26/2023   AST 28 11/26/2023   ALT 33 11/26/2023   ANIONGAP 9 11/26/2023    GFR: Estimated Creatinine Clearance: 58.7 mL/min (by C-G formula based on SCr of 0.48 mg/dL).  Recent Results (from the past 240 hours)  MRSA Next Gen by PCR, Nasal     Status: None   Collection Time: 11/19/23  2:11 PM   Specimen: Nasal Mucosa; Nasal Swab  Result Value Ref Range Status   MRSA by PCR Next Gen NOT DETECTED NOT DETECTED Final    Comment: (NOTE) The GeneXpert MRSA Assay (FDA approved for NASAL specimens only), is one component of a comprehensive MRSA colonization surveillance program. It is not intended to diagnose MRSA infection nor to guide or monitor treatment for MRSA infections. Test performance is not FDA approved in patients less than 67 years old. Performed at [facility], [address], [place], [place]  [address]   Respiratory (~20 pathogens) panel by PCR     Status: None   Collection Time: 11/24/23 10:34 AM   Specimen: Nasopharyngeal Swab; Respiratory  Result Value Ref Range Status   Adenovirus NOT DETECTED NOT DETECTED Final   Coronavirus 229E NOT DETECTED NOT DETECTED Final    Comment: (NOTE) The Coronavirus on the Respiratory Panel, DOES NOT test for the novel  Coronavirus (2019 nCoV)    Coronavirus HKU1 NOT DETECTED NOT DETECTED Final   Coronavirus NL63 NOT DETECTED NOT DETECTED Final   Coronavirus OC43 NOT DETECTED NOT DETECTED Final   Metapneumovirus NOT DETECTED NOT DETECTED Final   Rhinovirus / Enterovirus NOT DETECTED NOT DETECTED Final   Influenza A NOT DETECTED NOT DETECTED Final   Influenza B NOT DETECTED NOT DETECTED Final   Parainfluenza Virus 1 NOT DETECTED NOT DETECTED Final   Parainfluenza Virus 2 NOT DETECTED NOT DETECTED Final   Parainfluenza Virus 3 NOT DETECTED NOT DETECTED Final   Parainfluenza Virus 4 NOT DETECTED NOT DETECTED Final   Respiratory Syncytial Virus NOT DETECTED NOT DETECTED Final   Bordetella pertussis NOT DETECTED NOT DETECTED Final   Bordetella Parapertussis NOT DETECTED NOT DETECTED Final   Chlamydophila pneumoniae NOT DETECTED NOT DETECTED Final   Mycoplasma pneumoniae NOT DETECTED NOT DETECTED Final  Comment: Performed at [facility], [address], [place], [place] [address]      Radiology Studies: No results found.    LOS: 9 days    Elgin Lam, MD [facility] 11/29/2023, 11:01 AM   If 7PM-7AM, please contact night-coverage [email or web address]

## 2023-11-29 NOTE — Plan of Care (Signed)

## 2023-11-30 DIAGNOSIS — J700 Acute pulmonary manifestations due to radiation: Secondary | ICD-10-CM | POA: Diagnosis not present

## 2023-11-30 DIAGNOSIS — J9601 Acute respiratory failure with hypoxia: Secondary | ICD-10-CM | POA: Diagnosis not present

## 2023-11-30 DIAGNOSIS — J441 Chronic obstructive pulmonary disease with (acute) exacerbation: Secondary | ICD-10-CM | POA: Diagnosis not present

## 2023-11-30 LAB — GLUCOSE, CAPILLARY
Glucose-Capillary: 75 mg/dL (ref 70–99)
Glucose-Capillary: 117 mg/dL — ABNORMAL HIGH (ref 70–99)
Glucose-Capillary: 269 mg/dL — ABNORMAL HIGH (ref 70–99)
Glucose-Capillary: 189 mg/dL — ABNORMAL HIGH (ref 70–99)
Glucose-Capillary: 297 mg/dL — ABNORMAL HIGH (ref 70–99)

## 2023-11-30 NOTE — Plan of Care (Signed)
  Problem: Coping: Goal: Ability to adjust to condition or change in health will improve Outcome: Progressing   Problem: Fluid Volume: Goal: Ability to maintain a balanced intake and output will improve Outcome: Progressing   Problem: Tissue Perfusion: Goal: Adequacy of tissue perfusion will improve Outcome: Progressing   Problem: Education: Goal: Knowledge of General Education information will improve Description: Including pain rating scale, medication(s)/side effects and non-pharmacologic comfort measures Outcome: Progressing   Problem: Clinical Measurements: Goal: Diagnostic test results will improve Outcome: Progressing Goal: Respiratory complications will improve Outcome: Progressing

## 2023-11-30 NOTE — Plan of Care (Signed)

## 2023-11-30 NOTE — Progress Notes (Incomplete)
 Radiation Oncology         [phone] ________________________________  Name: Pamela [name] MRN: [id number]  Date: 12/01/2023  DOB: [birth year]  Follow-Up Visit Note  CC: Ransom Other, MD  Ransom Other, MD  No diagnosis found.  Diagnosis: Stage IIIa (T3, N1, M0) non-small cell lung cancer, squamous cell carcinoma with a focal area of small cell   Interval Since Last Radiation: 1 month and 1 day   Intent: Curative  Radiation Treatment Dates: First Treatment Date: 2023-09-16 -- Last Treatment Date: 2023-10-31 Site/Dose/Technique/Mode:  Plan Name: Lung_L Site: Lung, Left Technique: 3D Mode: Photon Dose Per Fraction: 2 Gy Prescribed Dose (Delivered / Prescribed): 60 Gy / 60 Gy Prescribed Fxs (Delivered / Prescribed): 30 / 30   Plan Name: Lung_L_Bst Site: Lung, Left Technique: 3D Mode: Photon Dose Per Fraction: 2 Gy Prescribed Dose (Delivered / Prescribed): 6 Gy / 6 Gy Prescribed Fxs (Delivered / Prescribed): 3 / 3  Narrative:  The patient returns today for routine follow-up after completing radiation therapy. She developed pain within the treatment field, mild fatigue, anticipated skin changes, and pain/difficulty with swallowing during her treatment course. She was given carafate to help with her esophagitis.    While undergoing treatment, she was hospitalized from 09/29/23 through 10/01/23 after being sent from the cancer center for evaluation of abnormal LFTs and intermittent nausea with vomiting and diarrhea for several days. A CT of abdomen and pelvis with contrast was performed upon admission which showed no acute inflammatory process with moderate to severe intra and extrahepatic bile duct dilation up to the level of ampulla. No distinct obstructing mass was demonstrated or evidence of discrete choledocholithiasis. An MRCP was then performed which showed marked intra and extrahepatic bile duct dilation without obstructing mass or choledocholithiasis. However, a 4 x  6 mm stone in the left main bile duct was demonstrated, with overall findings favoring ampullary stenosis. 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 Hospital course consisted of ERCP and sphincterotomy however no stones were extracted. She was later discharged in stable condition.   Since completing radiation therapy, she presented for a restaging chest CT on 11/17/23 which demonstrated improvement in the 5.1 x 4.5 cm thick-walled cavitary mass in the superior segment left lower lobe, corresponding to the patient's known primary bronchogenic carcinoma. Suspected radiation changes were also demonstrated in the left hemithorax.   To review, she also began concurrent chemotherapy consisting of carboplatin  and paclitaxel  on 09/15/23 under the care of Dr. Sherrod. Her 6th cycle was delayed for 1 week due to thrombocytopenia, and she was later able to receive her 6th and final cycle of chemotherapy on 10/27/23.   In present history, the patient was hospitalized on 11/19/23 after presenting with abrupt onset of resting shortness of breath with wheezing, dyspnea on exertion and orthopnea. She was ultimately admitted for management of acute respiratory failure with hypoxia and radiation pneumonitis in the setting of prior chemoradiation. She remains admitted at this time under observation due to significant hypoxia. She has required as much as 6 L/min supplemental oxygen. Her most recent chest x-ray performed yesterday shows stability of the LLL mass and of the previously demonstrated interstitial and hazy lung opacities. No new or acute findings were demonstrated overall.   ***   Allergies:  has no known allergies.  Meds: No current facility-administered medications for this encounter.   No current outpatient medications on file.   Facility-Administered Medications Ordered in Other Encounters  Medication Dose Route Frequency Provider Last Rate Last  Admin   acetaminophen  (TYLENOL ) tablet  650 mg  650 mg Oral Q6H PRN Alto Isaiah CROME, NP       Or   acetaminophen  (TYLENOL ) suppository 650 mg  650 mg Rectal Q6H PRN Alto Isaiah CROME, NP       albuterol  (PROVENTIL ) (2.5 MG/3ML) 0.083% nebulizer solution 2.5 mg  2.5 mg Nebulization Q2H PRN Alto Isaiah CROME, NP   2.5 mg at 11/27/23 2126   amLODipine  (NORVASC ) tablet 10 mg  10 mg Oral Daily Alto Isaiah CROME, NP   10 mg at 11/30/23 0944   arformoterol  (BROVANA ) nebulizer solution 15 mcg  15 mcg Nebulization BID Dewald, Jonathan B, MD   15 mcg at 11/30/23 0745   aspirin  EC tablet 81 mg  81 mg Oral Daily Alto Isaiah CROME, NP   81 mg at 11/30/23 0945   atorvastatin  (LIPITOR) tablet 10 mg  10 mg Oral QHS Alto Isaiah CROME, NP   10 mg at 11/29/23 2205   carvedilol  (COREG ) tablet 12.5 mg  12.5 mg Oral BID WC Cindy Garnette POUR, MD   12.5 mg at 11/30/23 9191   enoxaparin  (LOVENOX ) injection 40 mg  40 mg Subcutaneous Q24H Alto Isaiah CROME, NP   40 mg at 11/29/23 1712   guaiFENesin -dextromethorphan  (ROBITUSSIN DM) 100-10 MG/5ML syrup 5 mL  5 mL Oral Q4H PRN Cindy Garnette POUR, MD   5 mL at 11/21/23 1449   insulin  aspart (novoLOG ) injection 0-20 Units  0-20 Units Subcutaneous TID WC Cindy Garnette POUR, MD   11 Units at 11/30/23 1205   insulin  aspart (novoLOG ) injection 0-5 Units  0-5 Units Subcutaneous QHS Cindy Garnette POUR, MD   3 Units at 11/29/23 2207   insulin  aspart (novoLOG ) injection 7 Units  7 Units Subcutaneous TID WC Briana Elgin LABOR, MD   7 Units at 11/30/23 1206   insulin  glargine-yfgn (SEMGLEE ) injection 20 Units  20 Units Subcutaneous Daily Briana Elgin LABOR, MD   20 Units at 11/30/23 0945   latanoprost  (XALATAN ) 0.005 % ophthalmic solution 1 drop  1 drop Both Eyes QHS Chavez, Abigail, NP   1 drop at 11/29/23 2206   melatonin tablet 3 mg  3 mg Oral QHS PRN Opyd, Timothy S, MD   3 mg at 11/29/23 2205   ondansetron  (ZOFRAN ) tablet 4 mg  4 mg Oral Q6H PRN Alto Isaiah CROME, NP       Or   ondansetron  (ZOFRAN ) injection 4 mg  4 mg Intravenous Q6H PRN Alto Isaiah CROME, NP       predniSONE  (DELTASONE ) tablet 40 mg  40 mg Oral Q breakfast Byrum, Robert S, MD   40 mg at 11/30/23 9192   revefenacin  (YUPELRI ) nebulizer solution 175 mcg  175 mcg Nebulization Daily Kara Dorn NOVAK, MD   175 mcg at 11/30/23 0745   Sirolimus  (RAPAMUNE ) tablet 2 mg  2 mg Oral Daily Alto Isaiah CROME, NP   2 mg at 11/30/23 0943   sodium chloride  flush (NS) 0.9 % injection 3 mL  3 mL Intravenous Q12H Alto Isaiah CROME, NP   3 mL at 11/30/23 0946   tacrolimus  ER (ENVARSUS  XR) tablet 1 mg  1 mg Oral Daily Alto Isaiah CROME, NP   1 mg at 11/30/23 9055    Physical Findings: The patient is in no acute distress. Patient is alert and oriented.  vitals were not taken for this visit. .  No significant changes. Lungs are clear to auscultation bilaterally. Heart has regular rate and rhythm.  No palpable cervical, supraclavicular, or axillary adenopathy. Abdomen soft, non-tender, normal bowel sounds.   Lab Findings: Lab Results  Component Value Date   WBC 8.1 11/26/2023   HGB 9.3 (L) 11/26/2023   HCT 30.1 (L) 11/26/2023   MCV 87.5 11/26/2023   PLT 128 (L) 11/26/2023    Radiographic Findings: DG CHEST PORT 1 VIEW Result Date: 11/29/2023 CLINICAL DATA:  Respiratory distress.  History of lung carcinoma. EXAM: PORTABLE CHEST 1 VIEW COMPARISON:  11/24/2023 and older studies. FINDINGS: Coarse interstitial thickening, left mid to lower lung with intervening hazy opacities. Similar but less prominent appearance at the right lung base. Cavitary mass noted on prior imaging lateral to the left hilum is less apparent, difference likely technical. Remainder of the lungs is clear. Cardiac silhouette is normal in size. Stable left hilar prominence. No mediastinal or right hilar masses. No convincing pleural effusion or pneumothorax. Stable left upper extremity vascular stent. IMPRESSION: 1. No acute findings. 2. Left lower lobe, perihilar cavitary mass is less well-defined than on the prior exams.  Interstitial and hazy lung opacities detailed above are without significant change. Lung opacities may reflect infection or post treatment related inflammation. Electronically Signed   By: Alm Parkins M.D.   On: 11/29/2023 12:32   DG CHEST PORT 1 VIEW Result Date: 11/24/2023 CLINICAL DATA:  Radiation pneumonitis. EXAM: PORTABLE CHEST 1 VIEW COMPARISON:  November 19, 2023.  November 17, 2023. FINDINGS: The heart size and mediastinal contours are within normal limits. Right lung is clear. Cavitary abnormality is noted in left perihilar region corresponding to walled cavitary mass noted on previous CT scan in superior segment of left lower lobe consistent with history of lung cancer. Stable reticular densities are noted throughout the left lung base which may represent radiation fibrosis. The visualized skeletal structures are unremarkable. IMPRESSION: Cavitary abnormality seen in left lower lobe corresponding to thick-walled cavitary mass or malignancy noted on prior CT scan. 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 Electronically Signed   By: Lynwood Landy Raddle M.D.   On: 11/24/2023 10:40   ECHOCARDIOGRAM COMPLETE Result Date: 11/19/2023    ECHOCARDIOGRAM REPORT   Patient Name:   Pamela [name] Date of Exam: 11/19/2023 Medical Rec #:  [id number]        Height:       61.5 in Accession #:    [id number]       Weight:       160.0 lb Date of Birth:  11-19-1950       BSA:          1.728 m Patient Age:    72 years         BP:           127/63 mmHg Patient Gender: F                HR:           85 bpm. Exam Location:  Inpatient Procedure: 2D Echo, Cardiac Doppler and Color Doppler (Both Spectral and Color            Flow Doppler were utilized during procedure). Indications:    CHF  History:        Patient has prior history of Echocardiogram examinations, most                 recent 01/26/2019. Acute Respiratory Failure.  Sonographer:    Benard Stallion Referring Phys: [id number] ALLISON L ELLIS  IMPRESSIONS  1. Left ventricular ejection  fraction, by estimation, is 55 to 60%. The left ventricle has normal function. The left ventricle has no regional wall motion abnormalities. Left ventricular diastolic parameters are consistent with Grade I diastolic dysfunction (impaired relaxation). Elevated left atrial pressure.  2. Right ventricular systolic function is normal. The right ventricular size is normal. Tricuspid regurgitation signal is inadequate for assessing PA pressure.  3. The mitral valve is normal in structure. Trivial mitral valve regurgitation. No evidence of mitral stenosis.  4. The aortic valve is tricuspid. Aortic valve regurgitation is not visualized. No aortic stenosis is present.  5. The inferior vena cava is normal in size with greater than 50% respiratory variability, suggesting right atrial pressure of 3 mmHg. FINDINGS  Left Ventricle: Left ventricular ejection fraction, by estimation, is 55 to 60%. The left ventricle has normal function. The left ventricle has no regional wall motion abnormalities. The left ventricular internal cavity size was normal in size. There is  no left ventricular hypertrophy. Left ventricular diastolic parameters are consistent with Grade I diastolic dysfunction (impaired relaxation). Elevated left atrial pressure. Right Ventricle: The right ventricular size is normal. Right ventricular systolic function is normal. Tricuspid regurgitation signal is inadequate for assessing PA pressure. The tricuspid regurgitant velocity is 2.54 m/s, and with an assumed right atrial  pressure of 3 mmHg, the estimated right ventricular systolic pressure is 28.8 mmHg. Left Atrium: Left atrial size was normal in size. Right Atrium: Right atrial size was normal in size. Pericardium: There is no evidence of pericardial effusion. Mitral Valve: The mitral valve is normal in structure. Trivial mitral valve regurgitation. No evidence of mitral valve stenosis. Tricuspid Valve: The tricuspid  valve is normal in structure. Tricuspid valve regurgitation is trivial. No evidence of tricuspid stenosis. Aortic Valve: The aortic valve is tricuspid. Aortic valve regurgitation is not visualized. No aortic stenosis is present. Aortic valve mean gradient measures 4.0 mmHg. Aortic valve peak gradient measures 7.2 mmHg. Pulmonic Valve: The pulmonic valve was normal in structure. Pulmonic valve regurgitation is not visualized. No evidence of pulmonic stenosis. Aorta: The aortic root is normal in size and structure. Venous: The inferior vena cava is normal in size with greater than 50% respiratory variability, suggesting right atrial pressure of 3 mmHg. IAS/Shunts: The interatrial septum is aneurysmal. No atrial level shunt detected by color flow Doppler.  LEFT VENTRICLE PLAX 2D LVIDd:         4.30 cm   Diastology LVIDs:         2.80 cm   LV e' medial:    5.44 cm/s LV PW:         0.90 cm   LV E/e' medial:  17.2 LV IVS:        0.90 cm   LV e' lateral:   7.29 cm/s LVOT diam:     1.80 cm   LV E/e' lateral: 12.9 LV SV:         50 LV SV Index:   29 LVOT Area:     2.54 cm  RIGHT VENTRICLE RV Basal diam:  2.20 cm RV Mid diam:    1.80 cm RV S prime:     11.40 cm/s TAPSE (M-mode): 2.0 cm LEFT ATRIUM             Index        RIGHT ATRIUM          Index LA diam:        3.20 cm 1.85 cm/m   RA Area:  8.33 cm LA Vol (A2C):   34.1 ml 19.73 ml/m  RA Volume:   11.80 ml 6.83 ml/m LA Vol (A4C):   44.9 ml 25.98 ml/m LA Biplane Vol: 41.2 ml 23.84 ml/m  AORTIC VALVE AV Area (Vmax):    1.56 cm AV Area (Vmean):   1.48 cm AV Area (VTI):     1.99 cm AV Vmax:           134.00 cm/s AV Vmean:          95.000 cm/s AV VTI:            0.251 m AV Peak Grad:      7.2 mmHg AV Mean Grad:      4.0 mmHg LVOT Vmax:         82.30 cm/s LVOT Vmean:        55.100 cm/s LVOT VTI:          0.196 m LVOT/AV VTI ratio: 0.78  AORTA Ao Root diam: 2.60 cm Ao Asc diam:  2.90 cm MITRAL VALVE                TRICUSPID VALVE MV Area (PHT): 4.80 cm     TR Peak  grad:   25.8 mmHg MV Decel Time: 158 msec     TR Vmax:        254.00 cm/s MV E velocity: 93.80 cm/s MV A velocity: 138.00 cm/s  SHUNTS MV E/A ratio:  0.68         Systemic VTI:  0.20 m                             Systemic Diam: 1.80 cm Redell Shallow MD Electronically signed by Redell Shallow MD Signature Date/Time: 11/19/2023/2:09:35 PM    Final    DG Chest Portable 1 View Result Date: 11/19/2023 CLINICAL DATA:  Shortness of breath. EXAM: PORTABLE CHEST 1 VIEW COMPARISON:  August 18, 2023.  November 17, 2023. FINDINGS: Normal cardiac size. Right lung is clear. Left perihilar density is noted which corresponds to cavitary mass noted on prior CT scan. Left lower lobe airspace opacity is noted most consistent with pneumonia or atelectasis. The visualized skeletal structures are unremarkable. IMPRESSION: Left perihilar density is noted which corresponds to cavitary mass seen on recent CT scan. Large left lower lobe airspace opacity is noted most consistent with postobstructive pneumonia or atelectasis. Electronically Signed   By: Lynwood Landy Raddle M.D.   On: 11/19/2023 10:15   CT Chest W Contrast Result Date: 11/17/2023 EXAM: CT CHEST WITH CONTRAST 11/17/2023 10:13:11 AM TECHNIQUE: CT of the chest was performed with the administration of intravenous contrast. Multiplanar reformatted images are provided for review. Automated exposure control, iterative reconstruction, and/or weight based adjustment of the mA/kV was utilized to reduce the radiation dose to as low as reasonably achievable. COMPARISON: PET/CT dated 09/04/2023. CLINICAL HISTORY: Non-small cell lung cancer (NSCLC), staging. Patient for f/u non small cell lung cancer; Sx to chest. FINDINGS: MEDIASTINUM: Heart and pericardium are unremarkable. The central airways are clear. Moderate 3-vessel coronary atherosclerosis. Thoracic aortic atherosclerosis. LYMPH NODES: No mediastinal, hilar or axillary lymphadenopathy. LUNGS AND PLEURA: 5.1 x 4.5 cm thick-walled cavitary  mass in the superior segment left lower lobe (image 59), improved, now with progressive central gas. Multifocal patchy opacities in the left hemithorax (image 78), new, suggesting radiation changes. Moderate centrilobular and paraseptal emphysematous changes, upper lung predominant. No pleural effusion or pneumothorax. SOFT TISSUES/BONES: Mild  degenerative changes of the visualized thoracolumbar spine. No acute abnormality of the soft tissues. UPPER ABDOMEN: Status post cholecystectomy with pneumobilia. Severe bilateral renal cortical scarring/atrophy with a dominant 1.6 cm right upper pole simple cyst (image 137), benign. No follow up is recommended. IMPRESSION: 1. 5.1 x 4.5 cm thick-walled cavitary mass in the superior segment left lower lobe, corresponding to the patient's known primary bronchogenic carcinoma, improved. 2. Suspected radiation changes in the left hemithorax. Electronically signed by: Pinkie Pebbles MD 11/17/2023 08:17 PM EDT RP Workstation: [id number]    Impression: Stage IIIa (T3, N1, M0) non-small cell lung cancer, squamous cell carcinoma with a focal area of small cell   The patient is recovering from the effects of radiation.  ***  Plan:  ***   *** minutes of total time was spent for this patient encounter, including preparation, face-to-face counseling with the patient and coordination of care, physical exam, and documentation of the encounter. ____________________________________  Lynwood CHARM Nasuti, PhD, MD  This document serves as a record of services personally performed by Lynwood Nasuti, MD. It was created on his behalf by Dorthy Fuse, a trained medical scribe. The creation of this record is based on the scribe's personal observations and the provider's statements to them. This document has been checked and approved by the attending provider.

## 2023-11-30 NOTE — Progress Notes (Signed)
 PROGRESS NOTE    Pamela [name]  FMW:[id number] DOB: 06/17/1950 DOA: 11/19/2023 PCP: Ransom Other, MD   Brief Narrative: Pamela [name] is a 73 y.o. female with a history of lung cancer status post chemoradiation, COPD, hyperlipidemia, hypertension, diabetes, CAD, renal transplant, heart failure with reduced EF.  Patient presented secondary to shortness of breath and wheezing with initial concern for postobstructive pneumonia with associated acute respiratory failure.  Patient initially managed with IV antibiotics without significant improvement.  CT imaging consistent with radiation pneumonitis and patient was managed on steroids instead.  Pulmonology was consulted for management as well.  Patient with improvement on current regimen of steroids and is now initiating a taper.   Assessment and Plan:  Acute respite failure with hypoxia Presumed secondary to radiation pneumonitis.  Initial concern for pneumonia on chest x-ray, however chest CT suggest likely radiation changes.  Patient requiring as much as 6 L/min supplemental oxygen. Patient with significant hypoxia with ambulation on 7/18 even on 6 L/min of oxygen. 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  Pulmonology consult this admission.  Patient started on Solu-Medrol  IV for management and has been transitioned to prednisone . - Pulmonology recommendations: Prednisone  taper starting with prednisone  40 mg (first dose 7/17) for 4 weeks followed by taper of 10 mg weekly until back to home dose of 5 mg daily  COPD exacerbation Patient management as above.  Pulmonology added Yupelri  to her regimen. Exacerbation appears to be resolved. - Continue Yupelri , Brovana , DuoNebs  Chronic heart failure with recovered  EF Stable.  No evidence that patient's current admission is related to acute heart failure. Transthoracic Echocardiogram this admission significant for a recovered LVEF of 55-60% and associated grade 1 diastolic dysfunction. - Continue Coreg   History of stage IIIa non-small cell lung cancer Patient follows with oncology as an outpatient.  Patient is seen medical and radiation oncology and recently completed treatment.  Diabetes mellitus type 2 Well-controlled based on last hemoglobin A1c of 7.4% from May 2025.  Patient is on Lantus , Humalog, glimepiride  as an outpatient. Patient with 17 units of blood sugar coverage yesterday. - Continue insulin  glargine 20 units daily - Continue insulin  aspart 7 units 3 times daily with meals - Continue sliding scale insulin  - Discontinue glimepiride  on discharge  Primary hypertension - Continue amlodipine , Coreg   CAD Stable. For continue aspirin  and Lipitor  History of renal transplant Transplant from 2019. - Continue tacrolimus  and Rapamune   Hyponatremia Mild.  Asymptomatic.   DVT prophylaxis: Lovenox  Code Status:   Code Status: Full Code Family Communication: Husband at bedside Disposition Plan: Discharge home with home health once able to wean oxygen down to accommodate as an outpatient   Consultants:  Pulmonology  Procedures:  Transthoracic Echocardiogram  Antimicrobials: Vancomycin  Ceftriaxone  Cefepime  Azithromycin     Subjective: No dyspnea. Had a breathing treatment this morning.  Objective: BP (!) 152/69 (BP Location: Right Arm)   Pulse 88   Temp 97.9 F (36.6 C)   Resp 16   Ht 5' 1.5 (1.562 m)   Wt 73.6 kg   SpO2 92%   BMI 30.16 kg/m   Examination:  General exam: Appears calm and comfortable Respiratory system: Mild left-sided wheeze. Mild tachypnea. Cardiovascular system: S1 & S2 heard, RRR. No murmurs, rubs, gallops or clicks. Gastrointestinal  system: Abdomen is nondistended, soft and nontender. Normal  bowel sounds heard. Central nervous system: Alert and oriented. No focal neurological deficits. Psychiatry: Judgement and insight appear normal. Mood & affect appropriate.    Data Reviewed: I have personally reviewed following labs and imaging studies  CBC Lab Results  Component Value Date   WBC 8.1 11/26/2023   RBC 3.44 (L) 11/26/2023   HGB 9.3 (L) 11/26/2023   HCT 30.1 (L) 11/26/2023   MCV 87.5 11/26/2023   MCH 27.0 11/26/2023   PLT 128 (L) 11/26/2023   MCHC 30.9 11/26/2023   RDW 14.5 11/26/2023   LYMPHSABS 0.1 (L) 11/17/2023   MONOABS 1.1 (H) 11/17/2023   EOSABS 0.0 11/17/2023   BASOSABS 0.0 11/17/2023     Last metabolic panel Lab Results  Component Value Date   NA 133 (L) 11/26/2023   K 4.5 11/26/2023   CL 96 (L) 11/26/2023   CO2 28 11/26/2023   BUN 25 (H) 11/26/2023   CREATININE 0.48 11/26/2023   GLUCOSE 172 (H) 11/26/2023   GFRNONAA >60 11/26/2023   GFRAA 6 (L) 11/24/2017   CALCIUM  8.5 (L) 11/26/2023   PHOS 4.6 11/24/2017   PROT 5.6 (L) 11/26/2023   ALBUMIN 2.6 (L) 11/26/2023   BILITOT 0.6 11/26/2023   ALKPHOS 78 11/26/2023   AST 28 11/26/2023   ALT 33 11/26/2023   ANIONGAP 9 11/26/2023    GFR: Estimated Creatinine Clearance: 58.1 mL/min (by C-G formula based on SCr of 0.48 mg/dL).  Recent Results (from the past 240 hours)  Respiratory (~20 pathogens) panel by PCR     Status: None   Collection Time: 11/24/23 10:34 AM   Specimen: Nasopharyngeal Swab; Respiratory  Result Value Ref Range Status   Adenovirus NOT DETECTED NOT DETECTED Final   Coronavirus 229E NOT DETECTED NOT DETECTED Final    Comment: (NOTE) The Coronavirus on the Respiratory Panel, DOES NOT test for the novel  Coronavirus (2019 nCoV)    Coronavirus HKU1 NOT DETECTED NOT DETECTED Final   Coronavirus NL63 NOT DETECTED NOT DETECTED Final   Coronavirus OC43 NOT DETECTED NOT DETECTED Final   Metapneumovirus NOT DETECTED NOT DETECTED Final   Rhinovirus / Enterovirus NOT DETECTED NOT  DETECTED Final   Influenza A NOT DETECTED NOT DETECTED Final   Influenza B NOT DETECTED NOT DETECTED Final   Parainfluenza Virus 1 NOT DETECTED NOT DETECTED Final   Parainfluenza Virus 2 NOT DETECTED NOT DETECTED Final   Parainfluenza Virus 3 NOT DETECTED NOT DETECTED Final   Parainfluenza Virus 4 NOT DETECTED NOT DETECTED Final   Respiratory Syncytial Virus NOT DETECTED NOT DETECTED Final   Bordetella pertussis NOT DETECTED NOT DETECTED Final   Bordetella Parapertussis NOT DETECTED NOT DETECTED Final   Chlamydophila pneumoniae NOT DETECTED NOT DETECTED Final   Mycoplasma pneumoniae NOT DETECTED NOT DETECTED Final    Comment: Performed at [facility], [address] [place], [place] [address]      Radiology Studies: DG CHEST PORT 1 VIEW Result Date: 11/29/2023 CLINICAL DATA:  Respiratory distress.  History of lung carcinoma. EXAM: PORTABLE CHEST 1 VIEW COMPARISON:  11/24/2023 and older studies. FINDINGS: Coarse interstitial thickening, left mid to lower lung with intervening hazy opacities. Similar but less prominent appearance at the right lung base. Cavitary mass noted on prior imaging lateral to the left hilum is less apparent, difference likely technical. Remainder of the lungs is clear. Cardiac silhouette is normal in size. Stable left hilar prominence. No mediastinal or right hilar masses. No convincing  pleural effusion or pneumothorax. Stable left upper extremity vascular stent. IMPRESSION: 1. No acute findings. 2. Left lower lobe, perihilar cavitary mass is less well-defined than on the prior exams. Interstitial and hazy lung opacities detailed above are without significant change. Lung opacities may reflect infection or post treatment related inflammation. Electronically Signed   By: Alm Parkins M.D.   On: 11/29/2023 12:32      LOS: 10 days    Elgin Lam, MD [facility] 11/30/2023, 11:23 AM   If 7PM-7AM, please contact night-coverage [email or web address]

## 2023-12-01 ENCOUNTER — Ambulatory Visit
Admission: RE | Admit: 2023-12-01 | Discharge: 2023-12-01 | Disposition: A | Discharge status: Home / Self Care | Source: Ambulatory Visit | Attending: Radiation Oncology | Admitting: Radiation Oncology

## 2023-12-01 DIAGNOSIS — J9601 Acute respiratory failure with hypoxia: Secondary | ICD-10-CM | POA: Diagnosis not present

## 2023-12-01 DIAGNOSIS — J700 Acute pulmonary manifestations due to radiation: Secondary | ICD-10-CM | POA: Diagnosis not present

## 2023-12-01 DIAGNOSIS — J441 Chronic obstructive pulmonary disease with (acute) exacerbation: Secondary | ICD-10-CM | POA: Diagnosis not present

## 2023-12-01 LAB — GLUCOSE, CAPILLARY
Glucose-Capillary: 99 mg/dL (ref 70–99)
Glucose-Capillary: 89 mg/dL (ref 70–99)
Glucose-Capillary: 267 mg/dL — ABNORMAL HIGH (ref 70–99)
Glucose-Capillary: 255 mg/dL — ABNORMAL HIGH (ref 70–99)
Glucose-Capillary: 212 mg/dL — ABNORMAL HIGH (ref 70–99)

## 2023-12-01 NOTE — Progress Notes (Signed)
 Occupational Therapy Treatment Patient Details Name: Pamela [name] MRN: [id number] DOB: 11/02/1950 Today's Date: 12/01/2023   History of present illness Pamela [name] is a 73 y.o. female admitted with acute respiratory failure with hypoxia. PMH: left lung cancer recently completed chemotherapy mid June 2025, COPD, HTN, dyslipidemia, diabetes mellitus, CAD, anemia, renal transplant followed at [facility], [facility]   OT comments  Patient seen for skilled OT session this am. OT education this session with patient and husband on 5 P's of Energy Conservation and handout issued and trained on for light ADL's and functional mobility with breathing and pacing focus and teach back. Patient deferred recliner but open to EOB and brief standing mobility. Issued and trained in foam grasp cube and breathing ex program. OT will continue to follow acutely to progress activity tolerance and function. Patient and husband agree with plan for [facility] services once medically able to discharge home.       If plan is discharge home, recommend the following:  Assistance with cooking/housework;Help with stairs or ramp for entrance;Assist for transportation   Equipment Recommendations  None recommended by OT       Precautions / Restrictions Precautions Precautions: Fall Recall of Precautions/Restrictions: Intact Precaution/Restrictions Comments: monitor O2 Restrictions Weight Bearing Restrictions Per Provider Order: No       Mobility Bed Mobility Overal bed mobility: Modified Independent Bed Mobility: Supine to Sit, Sit to Supine           General bed mobility comments: EOB with ECT trainign and light breathing exercies with foam cube and ankle pump integration    Transfers Overall transfer level: Needs assistance Equipment used: Rolling walker (2 wheels) Transfers: Sit to/from Stand Sit to Stand: Supervision     Step pivot transfers: Contact guard assist     General transfer comment: Side step up  the bed with patient deferring recliner at this time     Balance Overall balance assessment: Needs assistance Sitting-balance support: Feet supported Sitting balance-Leahy Scale: Good     Standing balance support: During functional activity Standing balance-Leahy Scale: Fair Standing balance comment: RW for support                           ADL either performed or assessed with clinical judgement   ADL Overall ADL's : Needs assistance/impaired                                     Functional mobility during ADLs: Contact guard assist;Rolling walker (2 wheels) General ADL Comments: OT education this session with patient and husband on 5 P's of Energy Conservation and handout issued and trained on for light ADL's and functional mobility with breathing and pacing focus and teach back.    Extremity/Trunk Assessment Upper Extremity Assessment Upper Extremity Assessment: Overall WFL for tasks assessed   Lower Extremity Assessment Lower Extremity Assessment: Defer to PT evaluation                 Communication Communication Communication: No apparent difficulties   Cognition Arousal: Alert Behavior During Therapy: WFL for tasks assessed/performed Cognition: No apparent impairments                               Following commands: Intact        Cueing   Cueing Techniques: Verbal cues  Exercises Exercises: Other exercises (see above for foam cube, ankle pumps and breathing integration exercises)       General Comments patient reports difficulty with sats coming up post standing and transfers, 6 ltrs of O2 via HFNC with 92% at start of session with sats 88% with EOB and standing activity, recovery back to 92% after rests, cues and demo for pursed lip breathing    Pertinent Vitals/ Pain       Pain Assessment Pain Assessment: No/denies pain   Frequency  Min 2X/week        Progress Toward Goals  OT Goals(current goals can  now be found in the care plan section)  Progress towards OT goals: Progressing toward goals  Acute Rehab OT Goals Patient Stated Goal: to be able to breathe better OT Goal Formulation: With patient/family Time For Goal Achievement: 12/05/23 Potential to Achieve Goals: Good ADL Goals Pt Will Perform Grooming: with modified independence;standing Pt Will Perform Lower Body Dressing: with modified independence;sit to/from stand Pt Will Transfer to Toilet: with modified independence;ambulating Pt Will Perform Toileting - Clothing Manipulation and hygiene: with modified independence;sit to/from stand  Plan         AM-PAC OT 6 Clicks Daily Activity     Outcome Measure   Help from another person eating meals?: None Help from another person taking care of personal grooming?: None Help from another person toileting, which includes using toliet, bedpan, or urinal?: A Little Help from another person bathing (including washing, rinsing, drying)?: A Little Help from another person to put on and taking off regular upper body clothing?: A Little Help from another person to put on and taking off regular lower body clothing?: A Little 6 Click Score: 20    End of Session Equipment Utilized During Treatment: Rolling walker (2 wheels)  OT Visit Diagnosis: Muscle weakness (generalized) (M62.81);Unsteadiness on feet (R26.81)   Activity Tolerance Patient limited by fatigue   Patient Left in bed;with call bell/phone within reach;with bed alarm set;with family/visitor present   Nurse Communication Mobility status        Time: 8954-8887 OT Time Calculation (min): 27 min  Charges: OT General Charges $OT Visit: 1 Visit OT Treatments $Neuromuscular Re-education: 23-37 mins  Ridhaan Dreibelbis OT/L Acute Rehabilitation Department  [phone]  12/01/2023, 11:25 AM

## 2023-12-01 NOTE — Progress Notes (Signed)
 PROGRESS NOTE    Pamela [name]  FMW:[id number] DOB: [birth year] DOA: 11/19/2023 PCP: Ransom Other, MD   Brief Narrative: Pamela [name] is a 73 y.o. female with a history of lung cancer status post chemoradiation, COPD, hyperlipidemia, hypertension, diabetes, CAD, renal transplant, heart failure with reduced EF.  Patient presented secondary to shortness of breath and wheezing with initial concern for postobstructive pneumonia with associated acute respiratory failure.  Patient initially managed with IV antibiotics without significant improvement.  CT imaging consistent with radiation pneumonitis and patient was managed on steroids instead.  Pulmonology was consulted for management as well.  Patient with improvement on current regimen of steroids and is now initiating a taper.   Assessment and Plan:  Acute respite failure with hypoxia Presumed secondary to radiation pneumonitis.  Initial concern for pneumonia on chest x-ray, however chest CT suggest likely radiation changes.  Patient requiring as much as 6 L/min supplemental oxygen. Patient with significant hypoxia with ambulation on 7/18 even on 6 L/min of oxygen. Patient documented as weaned to 3 L/min, however this is erroneous. 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  Pulmonology consult this admission.  Patient started on Solu-Medrol  IV for management and has been transitioned to prednisone . - Pulmonology recommendations: Prednisone  taper starting with prednisone  40 mg (first dose 7/17) for 4 weeks followed by taper of 10 mg weekly until back to home dose of 5 mg daily  COPD exacerbation Patient management as above.  Pulmonology added Yupelri  to her regimen. Exacerbation appears to be resolved. - Continue Yupelri , Brovana ,  DuoNebs  Chronic heart failure with recovered EF Stable.  No evidence that patient's current admission is related to acute heart failure. Transthoracic Echocardiogram this admission significant for a recovered LVEF of 55-60% and associated grade 1 diastolic dysfunction. - Continue Coreg   History of stage IIIa non-small cell lung cancer Patient follows with oncology as an outpatient.  Patient is seen medical and radiation oncology and recently completed treatment.  Diabetes mellitus type 2 Well-controlled based on last hemoglobin A1c of 7.4% from May 2025.  Patient is on Lantus , Humalog, glimepiride  as an outpatient. Patient with 17 units of blood sugar coverage yesterday. - Continue insulin  glargine 20 units daily - Continue insulin  aspart 7 units 3 times daily with meals - Continue sliding scale insulin  - Discontinue glimepiride  on discharge  Primary hypertension - Continue amlodipine , Coreg   CAD Stable. For continue aspirin  and Lipitor  History of renal transplant Transplant from 2019. - Continue tacrolimus  and Rapamune   Hyponatremia Mild.  Asymptomatic.   DVT prophylaxis: Lovenox  Code Status:   Code Status: Full Code Family Communication: Husband at bedside Disposition Plan: Discharge home with home health once able to wean oxygen down to accommodate as an outpatient   Consultants:  Pulmonology  Procedures:  Transthoracic Echocardiogram  Antimicrobials: Vancomycin  Ceftriaxone  Cefepime  Azithromycin     Subjective: No dyspnea. Breathing well. Received a breathing treatment this morning.  Objective: BP (!) 144/71 (BP Location: Right Arm)   Pulse 95   Temp 98.3 F (36.8 C)   Resp 16   Ht 5' 1.5 (1.562 m)   Wt 73.6 kg   SpO2 93%   BMI 30.16 kg/m   Examination:  General exam: Appears calm and comfortable Respiratory system: Clear to auscultation. Respiratory effort normal. Cardiovascular system: S1 & S2 heard, RRR.  No murmurs. Gastrointestinal  system: Abdomen is nondistended, soft and nontender. Normal bowel sounds heard. Central nervous system: Alert and oriented. No focal neurological deficits. Psychiatry: Judgement and insight appear normal. Mood & affect appropriate.    Data Reviewed: I have personally reviewed following labs and imaging studies  CBC Lab Results  Component Value Date   WBC 8.1 11/26/2023   RBC 3.44 (L) 11/26/2023   HGB 9.3 (L) 11/26/2023   HCT 30.1 (L) 11/26/2023   MCV 87.5 11/26/2023   MCH 27.0 11/26/2023   PLT 128 (L) 11/26/2023   MCHC 30.9 11/26/2023   RDW 14.5 11/26/2023   LYMPHSABS 0.1 (L) 11/17/2023   MONOABS 1.1 (H) 11/17/2023   EOSABS 0.0 11/17/2023   BASOSABS 0.0 11/17/2023     Last metabolic panel Lab Results  Component Value Date   NA 133 (L) 11/26/2023   K 4.5 11/26/2023   CL 96 (L) 11/26/2023   CO2 28 11/26/2023   BUN 25 (H) 11/26/2023   CREATININE 0.48 11/26/2023   GLUCOSE 172 (H) 11/26/2023   GFRNONAA >60 11/26/2023   GFRAA 6 (L) 11/24/2017   CALCIUM  8.5 (L) 11/26/2023   PHOS 4.6 11/24/2017   PROT 5.6 (L) 11/26/2023   ALBUMIN 2.6 (L) 11/26/2023   BILITOT 0.6 11/26/2023   ALKPHOS 78 11/26/2023   AST 28 11/26/2023   ALT 33 11/26/2023   ANIONGAP 9 11/26/2023    GFR: Estimated Creatinine Clearance: 58.1 mL/min (by C-G formula based on SCr of 0.48 mg/dL).  Recent Results (from the past 240 hours)  Respiratory (~20 pathogens) panel by PCR     Status: None   Collection Time: 11/24/23 10:34 AM   Specimen: Nasopharyngeal Swab; Respiratory  Result Value Ref Range Status   Adenovirus NOT DETECTED NOT DETECTED Final   Coronavirus 229E NOT DETECTED NOT DETECTED Final    Comment: (NOTE) The Coronavirus on the Respiratory Panel, DOES NOT test for the novel  Coronavirus (2019 nCoV)    Coronavirus HKU1 NOT DETECTED NOT DETECTED Final   Coronavirus NL63 NOT DETECTED NOT DETECTED Final   Coronavirus OC43 NOT DETECTED NOT DETECTED Final   Metapneumovirus NOT DETECTED NOT  DETECTED Final   Rhinovirus / Enterovirus NOT DETECTED NOT DETECTED Final   Influenza A NOT DETECTED NOT DETECTED Final   Influenza B NOT DETECTED NOT DETECTED Final   Parainfluenza Virus 1 NOT DETECTED NOT DETECTED Final   Parainfluenza Virus 2 NOT DETECTED NOT DETECTED Final   Parainfluenza Virus 3 NOT DETECTED NOT DETECTED Final   Parainfluenza Virus 4 NOT DETECTED NOT DETECTED Final   Respiratory Syncytial Virus NOT DETECTED NOT DETECTED Final   Bordetella pertussis NOT DETECTED NOT DETECTED Final   Bordetella Parapertussis NOT DETECTED NOT DETECTED Final   Chlamydophila pneumoniae NOT DETECTED NOT DETECTED Final   Mycoplasma pneumoniae NOT DETECTED NOT DETECTED Final    Comment: Performed at [facility], [address], [place], [place] [address]      Radiology Studies: No results found.     LOS: 11 days    Elgin Lam, MD [facility] 12/01/2023, 9:08 AM   If 7PM-7AM, please contact night-coverage [email or web address]

## 2023-12-01 NOTE — TOC Progression Note (Signed)
 Transition of Care [facility]) - Progression Note    Patient Details  Name: Pamela [name] MRN: [id number] Date of Birth: 08-Jul-1950  Transition of Care [facility]) CM/SW Contact  Sheri ONEIDA [place], [place] Phone Number: 12/01/2023, 11:09 AM  Clinical Narrative:    CSW met w/ pt at bedside. Pt continues to decline HH services as recommended. O2 ordered via [employer] to be delivered to room.    Expected Discharge Plan: Home/Self Care Barriers to Discharge: Continued Medical Work up  Expected Discharge Plan and Services   Discharge Planning Services: CM Consult   Living arrangements for the past 2 months: Single Family Home                                       Social Determinants of Health (SDOH) Interventions SDOH Screenings   Food Insecurity: No Food Insecurity (11/20/2023)  Housing: Unknown (11/20/2023)  Transportation Needs: No Transportation Needs (11/20/2023)  Utilities: Not At Risk (11/20/2023)  Depression (PHQ2-9): Low Risk  (09/08/2023)  Social Connections: Socially Integrated (11/19/2023)  Tobacco Use: Medium Risk (11/28/2023)    Readmission Risk Interventions    12/01/2023   11:09 AM  Readmission Risk Prevention Plan  Transportation Screening Complete  PCP or Specialist Appt within 3-5 Days Complete  HRI or Home Care Consult Complete  Social Work Consult for Recovery Care Planning/Counseling Complete  Palliative Care Screening Not Applicable  Medication Review Oceanographer) Complete

## 2023-12-01 NOTE — Plan of Care (Signed)
  Problem: Nutritional: Goal: Progress toward achieving an optimal weight will improve Outcome: Progressing   Problem: Skin Integrity: Goal: Risk for impaired skin integrity will decrease Outcome: Progressing   Problem: Education: Goal: Knowledge of General Education information will improve Description: Including pain rating scale, medication(s)/side effects and non-pharmacologic comfort measures Outcome: Progressing   Problem: Nutrition: Goal: Adequate nutrition will be maintained Outcome: Progressing   Problem: Coping: Goal: Level of anxiety will decrease Outcome: Progressing   Problem: Pain Managment: Goal: General experience of comfort will improve and/or be controlled Outcome: Progressing   Problem: Safety: Goal: Ability to remain free from injury will improve Outcome: Progressing   Problem: Skin Integrity: Goal: Risk for impaired skin integrity will decrease Outcome: Progressing

## 2023-12-01 NOTE — Progress Notes (Signed)
 PT Cancellation Note  Patient Details Name: Pamela [name] MRN: [id number] DOB: 08-10-1950   Cancelled Treatment:    Reason Eval/Treat Not Completed: Fatigue/lethargy limiting ability to participate (pt reports she's too fatigued to attempt further mobility at present, she stated she sat edge of bed this morning with OT and is still fatigued with that.)   Sylvan Delon Copp PT 12/01/2023  Acute Rehabilitation Services  Office [phone]

## 2023-12-02 ENCOUNTER — Encounter (HOSPITAL_COMMUNITY): Payer: Self-pay

## 2023-12-02 ENCOUNTER — Encounter: Payer: Self-pay | Admitting: Internal Medicine

## 2023-12-02 ENCOUNTER — Other Ambulatory Visit (HOSPITAL_COMMUNITY): Payer: Self-pay

## 2023-12-02 DIAGNOSIS — J9601 Acute respiratory failure with hypoxia: Secondary | ICD-10-CM | POA: Diagnosis not present

## 2023-12-02 DIAGNOSIS — J700 Acute pulmonary manifestations due to radiation: Secondary | ICD-10-CM | POA: Insufficient documentation

## 2023-12-02 LAB — GLUCOSE, CAPILLARY
Glucose-Capillary: 134 mg/dL — ABNORMAL HIGH (ref 70–99)
Glucose-Capillary: 136 mg/dL — ABNORMAL HIGH (ref 70–99)
Glucose-Capillary: 252 mg/dL — ABNORMAL HIGH (ref 70–99)

## 2023-12-02 MED ORDER — ARFORMOTEROL TARTRATE 15 MCG/2ML IN NEBU
15.0000 ug | INHALATION_SOLUTION | Freq: Two times a day (BID) | RESPIRATORY_TRACT | 0 refills | Status: DC
  Filled 2023-12-02: qty 120, 30d supply, fill #0

## 2023-12-02 MED ORDER — CARMEX CLASSIC LIP BALM EX OINT
TOPICAL_OINTMENT | CUTANEOUS | Status: DC | PRN
  Filled 2023-12-02: qty 10

## 2023-12-02 MED ORDER — ARFORMOTEROL TARTRATE 15 MCG/2ML IN NEBU: 15.0000 ug | INHALATION_SOLUTION | Freq: Two times a day (BID) | RESPIRATORY_TRACT | 0 refills | Status: DC

## 2023-12-02 MED ORDER — REVEFENACIN 175 MCG/3ML IN SOLN: 175.0000 ug | Freq: Every day | RESPIRATORY_TRACT | 0 refills | Status: DC

## 2023-12-02 MED ORDER — PREDNISONE 10 MG PO TABS: ORAL_TABLET | ORAL | 0 refills | Status: DC

## 2023-12-02 MED ORDER — PREDNISONE 10 MG PO TABS
ORAL_TABLET | ORAL | 0 refills | Status: DC
  Filled 2023-12-02: qty 130, 43d supply, fill #0

## 2023-12-02 MED ORDER — REVEFENACIN 175 MCG/3ML IN SOLN
175.0000 ug | Freq: Every day | RESPIRATORY_TRACT | 0 refills | Status: DC
  Filled 2023-12-02: qty 270, 90d supply, fill #0

## 2023-12-02 NOTE — Discharge Summary (Signed)
 Physician Discharge Summary   Patient: Pamela [name] MRN: [id number] DOB: Mar 16, 1951  Admit date:     11/19/2023  Discharge date: 12/02/23  Discharge Physician: Elgin Lam, MD   PCP: Ransom Other, MD   Recommendations at discharge:  PCP visit for hospital follow-up Pulmonology visit for hospital follow-up  Discharge Diagnoses: Principal Problem:   Acute respiratory failure with hypoxia [facility]) Active Problems:   COPD exacerbation (HCC)   Pneumonia   Non-small cell lung cancer (HCC)   Radiation pneumonitis (HCC)  Resolved Problems:   * No resolved hospital problems. *  Hospital Course: Pamela [name] is a 73 y.o. female with a history of lung cancer status post chemoradiation, COPD, hyperlipidemia, hypertension, diabetes, CAD, renal transplant, heart failure with reduced EF.  Patient presented secondary to shortness of breath and wheezing with initial concern for postobstructive pneumonia with associated acute respiratory failure.  Patient initially managed with IV antibiotics without significant improvement.  CT imaging consistent with radiation pneumonitis and patient was managed on steroids instead.  Pulmonology was consulted for management as well.  Patient with improvement on current regimen of steroids and is now initiating a taper.  Assessment and Plan:  Acute respite failure with hypoxia Presumed secondary to radiation pneumonitis.  Initial concern for pneumonia on chest x-ray, however chest CT suggest likely radiation changes.  Patient requiring as much as 6 L/min supplemental oxygen. Patient with significant hypoxia with ambulation on 7/18 even on 6 L/min of oxygen. Patient unable to wean below 6 L/min and was set up for oxygen at home. Patient to follow-up with pulmonology as an outpatient.   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  Pulmonology consult this admission.  Patient started on Solu-Medrol  IV for management and has been transitioned  to prednisone . Pulmonology recommendations: Prednisone  taper starting with prednisone  40 mg (first dose 7/17) for 4 weeks followed by taper of 10 mg weekly until back to home dose of 5 mg daily   COPD exacerbation Patient management as above.  Pulmonology added Yupelri  to her regimen. Exacerbation appears to be resolved. Continue Yupelri , Brovana , and albuterol  on discharge.   Chronic heart failure with recovered EF Stable.  No evidence that patient's current admission is related to acute heart failure. Transthoracic Echocardiogram this admission significant for a recovered LVEF of 55-60% and associated grade 1 diastolic dysfunction. Continue Coreg .   History of stage IIIa non-small cell lung cancer Patient follows with oncology as an outpatient.  Patient is seen medical and radiation oncology and recently completed treatment.   Diabetes mellitus type 2 Well-controlled based on last hemoglobin A1c of 7.4% from May 2025.  Patient is on Lantus , Humalog, glimepiride  as an outpatient. Continue home insulin  regimen and discontinue glimepiride  on discharge.   Primary hypertension Continue amlodipine , Coreg    CAD Stable. For continue aspirin  and Lipitor   History of renal transplant Transplant from 2019. Continue tacrolimus  and Rapamune    Hyponatremia Mild.  Asymptomatic.   Consultants:  Pulmonology   Procedures:  Transthoracic Echocardiogram  Disposition: Home health Diet recommendation: Cardiac and Carb modified diet  DISCHARGE MEDICATION: Allergies as of 12/02/2023   No Known Allergies      Medication List     PAUSE taking these medications    predniSONE  5 MG tablet Wait to take this until: January 15, 2024 Commonly known as: DELTASONE  Take 5 mg by mouth daily with breakfast. You also have another medication with the same name that you may need to continue taking.  STOP taking these medications    glimepiride  2 MG tablet Commonly known as: AMARYL     ondansetron  8 MG tablet Commonly known as: Zofran    oxyCODONE -acetaminophen  5-325 MG tablet Commonly known as: PERCOCET/ROXICET   umeclidinium-vilanterol 62.5-25 MCG/INH Aepb Commonly known as: ANORO ELLIPTA       TAKE these medications    Accu-Chek Aviva Plus test strip Generic drug: glucose blood use as directed to check  blood sugar 4 times daily   Accu-Chek Aviva Soln DX: E11.21 as directed   Accu-Chek Softclix Lancets lancets DX: E11.21 as directed   albuterol  108 (90 Base) MCG/ACT inhaler Commonly known as: VENTOLIN  HFA Inhale 2 puffs into the lungs See admin instructions. Inhale 2 puffs into the lungs in the morning and at bedtime- may use an additional 2 puffs twice a day as needed for shortness of breath or wheezing   amLODipine  10 MG tablet Commonly known as: NORVASC  Take 10 mg by mouth daily.   arformoterol  15 MCG/2ML Nebu Commonly known as: BROVANA  Take 2 mLs (15 mcg total) by nebulization 2 (two) times daily.   aspirin  EC 81 MG tablet Take 81 mg by mouth daily.   atorvastatin  10 MG tablet Commonly known as: LIPITOR Take 1 tablet (10 mg total) by mouth daily. What changed: when to take this   carvedilol  25 MG tablet Commonly known as: COREG  Take 12.5 mg by mouth in the morning and at bedtime. What changed: Another medication with the same name was removed. Continue taking this medication, and follow the directions you see here.   insulin  lispro 100 UNIT/ML KiwkPen Commonly known as: HUMALOG Inject 8-13 Units into the skin See admin instructions. Inject 8-13 units into the skin three times a day with meals, PER SLIDING SCALE (when able to eat and tolerate)   Lantus  SoloStar 100 UNIT/ML Solostar Pen Generic drug: insulin  glargine Inject 22 Units into the skin in the morning.   latanoprost  0.005 % ophthalmic solution Commonly known as: XALATAN  Place 1 drop into both eyes at bedtime.   lidocaine  2 % solution Commonly known as:  XYLOCAINE  Patient: Mix 1part 2% viscous lidocaine , 1part H20. Swallow 10mL of diluted mixture, before meals and at bedtime, up to QID   nitroGLYCERIN  0.4 MG SL tablet Commonly known as: NITROSTAT  Place 0.4 mg under the tongue every 5 (five) minutes as needed for chest pain.   predniSONE  10 MG tablet Commonly known as: DELTASONE  Take 4 tablets (40 mg total) by mouth daily with breakfast for 22 days, THEN 3 tablets (30 mg total) daily with breakfast for 7 days, THEN 2 tablets (20 mg total) daily with breakfast for 7 days, THEN 1 tablet (10 mg total) daily with breakfast for 7 days. Start taking on: December 03, 2023 What changed: Another medication with the same name was paused. Ask your nurse or doctor if you should take this medication.   prochlorperazine  10 MG tablet Commonly known as: COMPAZINE  Take 1 tablet (10 mg total) by mouth every 6 (six) hours as needed for nausea or vomiting.   revefenacin  175 MCG/3ML nebulizer solution Commonly known as: YUPELRI  Take 3 mLs (175 mcg total) by nebulization daily. Start taking on: December 03, 2023   sirolimus  1 MG tablet Commonly known as: RAPAMUNE  Take 2 mg by mouth in the morning.   tacrolimus  ER 1 MG Tb24 Commonly known as: ENVARSUS  XR Take 1 mg by mouth daily.               Durable Medical  Equipment  (From admission, onward)           Start     Ordered   12/02/23 1351  For home use only DME oxygen  Once       Question Answer Comment  Length of Need 6 Months   Mode or (Route) Nasal cannula   Liters per Minute 6   Frequency Continuous (stationary and portable oxygen unit needed)   Oxygen conserving device Yes   Oxygen delivery system Gas      12/02/23 1350            Follow-up Information     Husain, Karrar, MD. Schedule an appointment as soon as possible for a visit in 1 week(s).   Specialty: Internal Medicine Why: For hospital follow-up Contact information: [address] [place]  [address] [phone]         Shelah Lamar RAMAN, MD Follow up on 01/07/2024.   Specialty: Pulmonary Disease Why: 10:45 AM Contact information: [address] [place] [address] [phone]                Discharge Exam: BP (!) 140/65 (BP Location: Right Arm)   Pulse 95   Temp 97.7 F (36.5 C)   Resp 16   Ht 5' 1.5 (1.562 m)   Wt 72.9 kg   SpO2 93%   BMI 29.88 kg/m   General exam: Appears calm and comfortable Respiratory system: Clear to auscultation. Respiratory effort normal. Cardiovascular system: S1 & S2 heard, RRR. No murmurs, rubs, gallops or clicks. Gastrointestinal system: Abdomen is nondistended, soft and nontender. Normal bowel sounds heard. Central nervous system: Alert and oriented. No focal neurological deficits. Psychiatry: Judgement and insight appear normal. Mood & affect appropriate.   Condition at discharge: stable  The results of significant diagnostics from this hospitalization (including imaging, microbiology, ancillary and laboratory) are listed below for reference.   Imaging Studies: DG CHEST PORT 1 VIEW Result Date: 11/29/2023 CLINICAL DATA:  Respiratory distress.  History of lung carcinoma. EXAM: PORTABLE CHEST 1 VIEW COMPARISON:  11/24/2023 and older studies. FINDINGS: Coarse interstitial thickening, left mid to lower lung with intervening hazy opacities. Similar but less prominent appearance at the right lung base. Cavitary mass noted on prior imaging lateral to the left hilum is less apparent, difference likely technical. Remainder of the lungs is clear. Cardiac silhouette is normal in size. Stable left hilar prominence. No mediastinal or right hilar masses. No convincing pleural effusion or pneumothorax. Stable left upper extremity vascular stent. IMPRESSION: 1. No acute findings. 2. Left lower lobe, perihilar cavitary mass is less well-defined than on the prior exams. Interstitial and hazy lung opacities detailed above are  without significant change. Lung opacities may reflect infection or post treatment related inflammation. Electronically Signed   By: Alm Parkins M.D.   On: 11/29/2023 12:32   DG CHEST PORT 1 VIEW Result Date: 11/24/2023 CLINICAL DATA:  Radiation pneumonitis. EXAM: PORTABLE CHEST 1 VIEW COMPARISON:  November 19, 2023.  November 17, 2023. FINDINGS: The heart size and mediastinal contours are within normal limits. Right lung is clear. Cavitary abnormality is noted in left perihilar region corresponding to walled cavitary mass noted on previous CT scan in superior segment of left lower lobe consistent with history of lung cancer. Stable reticular densities are noted throughout the left lung base which may represent radiation fibrosis. The visualized skeletal structures are unremarkable. IMPRESSION: Cavitary abnormality seen in left lower lobe corresponding to thick-walled cavitary  mass or malignancy noted on prior CT scan. 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 Electronically Signed   By: Lynwood Landy Raddle M.D.   On: 11/24/2023 10:40   ECHOCARDIOGRAM COMPLETE Result Date: 11/19/2023    ECHOCARDIOGRAM REPORT   Patient Name:   AYLINE DINGUS Date of Exam: 11/19/2023 Medical Rec #:  [id number]        Height:       61.5 in Accession #:    [id number]       Weight:       160.0 lb Date of Birth:  1951-05-06       BSA:          1.728 m Patient Age:    72 years         BP:           127/63 mmHg Patient Gender: F                HR:           85 bpm. Exam Location:  Inpatient Procedure: 2D Echo, Cardiac Doppler and Color Doppler (Both Spectral and Color            Flow Doppler were utilized during procedure). Indications:    CHF  History:        Patient has prior history of Echocardiogram examinations, most                 recent 01/26/2019. Acute Respiratory Failure.  Sonographer:    Benard Stallion Referring Phys: [id number] ALLISON L ELLIS IMPRESSIONS  1. Left ventricular ejection fraction, by  estimation, is 55 to 60%. The left ventricle has normal function. The left ventricle has no regional wall motion abnormalities. Left ventricular diastolic parameters are consistent with Grade I diastolic dysfunction (impaired relaxation). Elevated left atrial pressure.  2. Right ventricular systolic function is normal. The right ventricular size is normal. Tricuspid regurgitation signal is inadequate for assessing PA pressure.  3. The mitral valve is normal in structure. Trivial mitral valve regurgitation. No evidence of mitral stenosis.  4. The aortic valve is tricuspid. Aortic valve regurgitation is not visualized. No aortic stenosis is present.  5. The inferior vena cava is normal in size with greater than 50% respiratory variability, suggesting right atrial pressure of 3 mmHg. FINDINGS  Left Ventricle: Left ventricular ejection fraction, by estimation, is 55 to 60%. The left ventricle has normal function. The left ventricle has no regional wall motion abnormalities. The left ventricular internal cavity size was normal in size. There is  no left ventricular hypertrophy. Left ventricular diastolic parameters are consistent with Grade I diastolic dysfunction (impaired relaxation). Elevated left atrial pressure. Right Ventricle: The right ventricular size is normal. Right ventricular systolic function is normal. Tricuspid regurgitation signal is inadequate for assessing PA pressure. The tricuspid regurgitant velocity is 2.54 m/s, and with an assumed right atrial  pressure of 3 mmHg, the estimated right ventricular systolic pressure is 28.8 mmHg. Left Atrium: Left atrial size was normal in size. Right Atrium: Right atrial size was normal in size. Pericardium: There is no evidence of pericardial effusion. Mitral Valve: The mitral valve is normal in structure. Trivial mitral valve regurgitation. No evidence of mitral valve stenosis. Tricuspid Valve: The tricuspid valve is normal in structure. Tricuspid valve  regurgitation is trivial. No evidence of tricuspid stenosis. Aortic Valve: The aortic valve is tricuspid. Aortic valve regurgitation is not visualized. No aortic stenosis is present. Aortic valve mean gradient measures 4.0  mmHg. Aortic valve peak gradient measures 7.2 mmHg. Pulmonic Valve: The pulmonic valve was normal in structure. Pulmonic valve regurgitation is not visualized. No evidence of pulmonic stenosis. Aorta: The aortic root is normal in size and structure. Venous: The inferior vena cava is normal in size with greater than 50% respiratory variability, suggesting right atrial pressure of 3 mmHg. IAS/Shunts: The interatrial septum is aneurysmal. No atrial level shunt detected by color flow Doppler.  LEFT VENTRICLE PLAX 2D LVIDd:         4.30 cm   Diastology LVIDs:         2.80 cm   LV e' medial:    5.44 cm/s LV PW:         0.90 cm   LV E/e' medial:  17.2 LV IVS:        0.90 cm   LV e' lateral:   7.29 cm/s LVOT diam:     1.80 cm   LV E/e' lateral: 12.9 LV SV:         50 LV SV Index:   29 LVOT Area:     2.54 cm  RIGHT VENTRICLE RV Basal diam:  2.20 cm RV Mid diam:    1.80 cm RV S prime:     11.40 cm/s TAPSE (M-mode): 2.0 cm LEFT ATRIUM             Index        RIGHT ATRIUM          Index LA diam:        3.20 cm 1.85 cm/m   RA Area:     8.33 cm LA Vol (A2C):   34.1 ml 19.73 ml/m  RA Volume:   11.80 ml 6.83 ml/m LA Vol (A4C):   44.9 ml 25.98 ml/m LA Biplane Vol: 41.2 ml 23.84 ml/m  AORTIC VALVE AV Area (Vmax):    1.56 cm AV Area (Vmean):   1.48 cm AV Area (VTI):     1.99 cm AV Vmax:           134.00 cm/s AV Vmean:          95.000 cm/s AV VTI:            0.251 m AV Peak Grad:      7.2 mmHg AV Mean Grad:      4.0 mmHg LVOT Vmax:         82.30 cm/s LVOT Vmean:        55.100 cm/s LVOT VTI:          0.196 m LVOT/AV VTI ratio: 0.78  AORTA Ao Root diam: 2.60 cm Ao Asc diam:  2.90 cm MITRAL VALVE                TRICUSPID VALVE MV Area (PHT): 4.80 cm     TR Peak grad:   25.8 mmHg MV Decel Time: 158 msec      TR Vmax:        254.00 cm/s MV E velocity: 93.80 cm/s MV A velocity: 138.00 cm/s  SHUNTS MV E/A ratio:  0.68         Systemic VTI:  0.20 m                             Systemic Diam: 1.80 cm Redell Shallow MD Electronically signed by Redell Shallow MD Signature Date/Time: 11/19/2023/2:09:35 PM    Final    DG Chest Portable 1 View Result  Date: 11/19/2023 CLINICAL DATA:  Shortness of breath. EXAM: PORTABLE CHEST 1 VIEW COMPARISON:  August 18, 2023.  November 17, 2023. FINDINGS: Normal cardiac size. Right lung is clear. Left perihilar density is noted which corresponds to cavitary mass noted on prior CT scan. Left lower lobe airspace opacity is noted most consistent with pneumonia or atelectasis. The visualized skeletal structures are unremarkable. IMPRESSION: Left perihilar density is noted which corresponds to cavitary mass seen on recent CT scan. Large left lower lobe airspace opacity is noted most consistent with postobstructive pneumonia or atelectasis. Electronically Signed   By: Lynwood Landy Raddle M.D.   On: 11/19/2023 10:15   CT Chest W Contrast Result Date: 11/17/2023 EXAM: CT CHEST WITH CONTRAST 11/17/2023 10:13:11 AM TECHNIQUE: CT of the chest was performed with the administration of intravenous contrast. Multiplanar reformatted images are provided for review. Automated exposure control, iterative reconstruction, and/or weight based adjustment of the mA/kV was utilized to reduce the radiation dose to as low as reasonably achievable. COMPARISON: PET/CT dated 09/04/2023. CLINICAL HISTORY: Non-small cell lung cancer (NSCLC), staging. Patient for f/u non small cell lung cancer; Sx to chest. FINDINGS: MEDIASTINUM: Heart and pericardium are unremarkable. The central airways are clear. Moderate 3-vessel coronary atherosclerosis. Thoracic aortic atherosclerosis. LYMPH NODES: No mediastinal, hilar or axillary lymphadenopathy. LUNGS AND PLEURA: 5.1 x 4.5 cm thick-walled cavitary mass in the superior segment left lower lobe  (image 59), improved, now with progressive central gas. Multifocal patchy opacities in the left hemithorax (image 78), new, suggesting radiation changes. Moderate centrilobular and paraseptal emphysematous changes, upper lung predominant. No pleural effusion or pneumothorax. SOFT TISSUES/BONES: Mild degenerative changes of the visualized thoracolumbar spine. No acute abnormality of the soft tissues. UPPER ABDOMEN: Status post cholecystectomy with pneumobilia. Severe bilateral renal cortical scarring/atrophy with a dominant 1.6 cm right upper pole simple cyst (image 137), benign. No follow up is recommended. IMPRESSION: 1. 5.1 x 4.5 cm thick-walled cavitary mass in the superior segment left lower lobe, corresponding to the patient's known primary bronchogenic carcinoma, improved. 2. Suspected radiation changes in the left hemithorax. Electronically signed by: Pinkie Pebbles MD 11/17/2023 08:17 PM EDT RP Workstation: [id number]    Microbiology: Results for orders placed or performed during the hospital encounter of 11/19/23  Resp panel by RT-PCR (RSV, Flu A&B, Covid) Anterior Nasal Swab     Status: None   Collection Time: 11/19/23 10:51 AM   Specimen: Anterior Nasal Swab  Result Value Ref Range Status   SARS Coronavirus 2 by RT PCR NEGATIVE NEGATIVE Final    Comment: (NOTE) SARS-CoV-2 target nucleic acids are NOT DETECTED.  The SARS-CoV-2 RNA is generally detectable in upper respiratory specimens during the acute phase of infection. The lowest concentration of SARS-CoV-2 viral copies this assay can detect is 138 copies/mL. A negative result does not preclude SARS-Cov-2 infection and should not be used as the sole basis for treatment or other patient management decisions. A negative result may occur with  improper specimen collection/handling, submission of specimen other than nasopharyngeal swab, presence of viral mutation(s) within the areas targeted by this assay, and inadequate number of  viral copies(<138 copies/mL). A negative result must be combined with clinical observations, patient history, and epidemiological information. The expected result is Negative.  Fact Sheet for Patients:  BloggerCourse.com  Fact Sheet for Healthcare Providers:  SeriousBroker.it  This test is no t yet approved or cleared by the [place]  FDA and  has been authorized for detection and/or diagnosis of SARS-CoV-2 by FDA under an Emergency  Use Authorization (EUA). This EUA will remain  in effect (meaning this test can be used) for the duration of the COVID-19 declaration under Section 564(b)(1) of the Act, 21 U.S.C.section 360bbb-3(b)(1), unless the authorization is terminated  or revoked sooner.       Influenza A by PCR NEGATIVE NEGATIVE Final   Influenza B by PCR NEGATIVE NEGATIVE Final    Comment: (NOTE) The Xpert Xpress SARS-CoV-2/FLU/RSV plus assay is intended as an aid in the diagnosis of influenza from Nasopharyngeal swab specimens and should not be used as a sole basis for treatment. Nasal washings and aspirates are unacceptable for Xpert Xpress SARS-CoV-2/FLU/RSV testing.  Fact Sheet for Patients: BloggerCourse.com  Fact Sheet for Healthcare Providers: SeriousBroker.it  This test is not yet approved or cleared by the [place]  FDA and has been authorized for detection and/or diagnosis of SARS-CoV-2 by FDA under an Emergency Use Authorization (EUA). This EUA will remain in effect (meaning this test can be used) for the duration of the COVID-19 declaration under Section 564(b)(1) of the Act, 21 U.S.C. section 360bbb-3(b)(1), unless the authorization is terminated or revoked.     Resp Syncytial Virus by PCR NEGATIVE NEGATIVE Final    Comment: (NOTE) Fact Sheet for Patients: BloggerCourse.com  Fact Sheet for Healthcare  Providers: SeriousBroker.it  This test is not yet approved or cleared by the [place]  FDA and has been authorized for detection and/or diagnosis of SARS-CoV-2 by FDA under an Emergency Use Authorization (EUA). This EUA will remain in effect (meaning this test can be used) for the duration of the COVID-19 declaration under Section 564(b)(1) of the Act, 21 U.S.C. section 360bbb-3(b)(1), unless the authorization is terminated or revoked.  Performed at [facility], [address], [place], [place] [address]   MRSA Next Gen by PCR, Nasal     Status: None   Collection Time: 11/19/23  2:11 PM   Specimen: Nasal Mucosa; Nasal Swab  Result Value Ref Range Status   MRSA by PCR Next Gen NOT DETECTED NOT DETECTED Final    Comment: (NOTE) The GeneXpert MRSA Assay (FDA approved for NASAL specimens only), is one component of a comprehensive MRSA colonization surveillance program. It is not intended to diagnose MRSA infection nor to guide or monitor treatment for MRSA infections. Test performance is not FDA approved in patients less than 31 years old. Performed at [facility], [address], [place], [place] [address]   Respiratory (~20 pathogens) panel by PCR     Status: None   Collection Time: 11/24/23 10:34 AM   Specimen: Nasopharyngeal Swab; Respiratory  Result Value Ref Range Status   Adenovirus NOT DETECTED NOT DETECTED Final   Coronavirus 229E NOT DETECTED NOT DETECTED Final    Comment: (NOTE) The Coronavirus on the Respiratory Panel, DOES NOT test for the novel  Coronavirus (2019 nCoV)    Coronavirus HKU1 NOT DETECTED NOT DETECTED Final   Coronavirus NL63 NOT DETECTED NOT DETECTED Final   Coronavirus OC43 NOT DETECTED NOT DETECTED Final   Metapneumovirus NOT DETECTED NOT DETECTED Final   Rhinovirus / Enterovirus NOT DETECTED NOT DETECTED Final   Influenza A NOT DETECTED NOT DETECTED Final   Influenza B NOT  DETECTED NOT DETECTED Final   Parainfluenza Virus 1 NOT DETECTED NOT DETECTED Final   Parainfluenza Virus 2 NOT DETECTED NOT DETECTED Final   Parainfluenza Virus 3 NOT DETECTED NOT DETECTED Final   Parainfluenza Virus 4 NOT DETECTED NOT DETECTED Final   Respiratory Syncytial Virus NOT  DETECTED NOT DETECTED Final   Bordetella pertussis NOT DETECTED NOT DETECTED Final   Bordetella Parapertussis NOT DETECTED NOT DETECTED Final   Chlamydophila pneumoniae NOT DETECTED NOT DETECTED Final   Mycoplasma pneumoniae NOT DETECTED NOT DETECTED Final    Comment: Performed at [facility], [address], [place], [place] [address]    Labs: CBC: Recent Labs  Lab 11/26/23 0539  WBC 8.1  HGB 9.3*  HCT 30.1*  MCV 87.5  PLT 128*   Basic Metabolic Panel: Recent Labs  Lab 11/26/23 0539  NA 133*  K 4.5  CL 96*  CO2 28  GLUCOSE 172*  BUN 25*  CREATININE 0.48  CALCIUM  8.5*   Liver Function Tests: Recent Labs  Lab 11/26/23 0539  AST 28  ALT 33  ALKPHOS 78  BILITOT 0.6  PROT 5.6*  ALBUMIN 2.6*   CBG: Recent Labs  Lab 12/01/23 1622 12/01/23 2153 12/02/23 0439 12/02/23 0716 12/02/23 1204  GLUCAP 255* 212* 134* 136* 252*    Discharge time spent: 35 minutes.  Signed: Elgin Lam, MD [facility] 12/02/2023

## 2023-12-02 NOTE — TOC Transition Note (Signed)
 Transition of Care [facility]) - Discharge Note   Patient Details  Name: Pamela [name] MRN: [id number] Date of Birth: 07-21-1950  Transition of Care [facility]) CM/SW Contact:  Sheri ONEIDA Sharps, LCSW Phone Number: 12/02/2023, 2:41 PM   Clinical Narrative:    Pt medically ready to dc home. DME O2 ordered via [employer]. Travel tank delivered to room and home O2 to be delivered to pt home. Pt declined HH services as recommended. No further TOC needs.    Final next level of care: Home/Self Care Barriers to Discharge: Barriers Resolved   Patient Goals and CMS Choice Patient states their goals for this hospitalization and ongoing recovery are:: return home CMS Medicare.gov Compare Post Acute Care list provided to:: Patient Choice offered to / list presented to : NA [place] ownership interest in [facility].provided to:: Patient    Discharge Placement                    Patient and family notified of of transfer: 12/02/23  Discharge Plan and Services Additional resources added to the After Visit Summary for     Discharge Planning Services: CM Consult            DME Arranged: Oxygen DME Agency: [employer] Date DME Agency Contacted: 12/02/23 Time DME Agency Contacted: [phone] Representative spoke with at DME Agency: jermaine HH Arranged: NA HH Agency: NA        Social Drivers of Health (SDOH) Interventions SDOH Screenings   Food Insecurity: No Food Insecurity (11/20/2023)  Housing: Unknown (11/20/2023)  Transportation Needs: No Transportation Needs (11/20/2023)  Utilities: Not At Risk (11/20/2023)  Depression (PHQ2-9): Low Risk  (09/08/2023)  Social Connections: Socially Integrated (11/19/2023)  Tobacco Use: Medium Risk (11/28/2023)     Readmission Risk Interventions    12/01/2023   11:09 AM  Readmission Risk Prevention Plan  Transportation Screening Complete  PCP or Specialist Appt within 3-5 Days Complete  HRI or Home Care Consult Complete  Social Work  Consult for Recovery Care Planning/Counseling Complete  Palliative Care Screening Not Applicable  Medication Review Oceanographer) Complete

## 2023-12-02 NOTE — Plan of Care (Signed)

## 2023-12-02 NOTE — Discharge Instructions (Signed)
 Pamela [name],  You were here with trouble with breathing and your oxygen. This appears to be related to inflammation from your radiation treatments. You have been seen by the pulmonologist and he recommends a long steroid taper. Please continue your medications as recommended. You will also need to discharge on quite a bit of oxygen. Please ensure that you always have oxygen available as your body needs this to manage.

## 2023-12-08 ENCOUNTER — Telehealth: Payer: Self-pay | Admitting: Radiation Oncology

## 2023-12-08 ENCOUNTER — Telehealth: Payer: Self-pay | Admitting: Internal Medicine

## 2023-12-08 NOTE — Telephone Encounter (Signed)
 Contacted the patient to schedule a hospital follow up and lab work. The patient asked to be called back next week to schedule. She did not want to schedule at this time.

## 2023-12-08 NOTE — Telephone Encounter (Signed)
 Error

## 2023-12-08 NOTE — Telephone Encounter (Signed)
 Called patient to r/s f/u appointment w. Dr. Shannon. Patient stated she would not like to schedule at this time, advised for a call next week to schedule.

## 2023-12-10 ENCOUNTER — Emergency Department (HOSPITAL_COMMUNITY)

## 2023-12-10 ENCOUNTER — Inpatient Hospital Stay (HOSPITAL_COMMUNITY)
Admission: EM | Admit: 2023-12-10 | Discharge: 2023-12-12 | DRG: 208 | Disposition: E | Attending: Internal Medicine | Admitting: Internal Medicine

## 2023-12-10 ENCOUNTER — Encounter (HOSPITAL_COMMUNITY): Payer: Self-pay | Admitting: Internal Medicine

## 2023-12-10 ENCOUNTER — Inpatient Hospital Stay (HOSPITAL_COMMUNITY)

## 2023-12-10 ENCOUNTER — Telehealth: Payer: Self-pay

## 2023-12-10 ENCOUNTER — Other Ambulatory Visit: Payer: Self-pay

## 2023-12-10 DIAGNOSIS — I10 Essential (primary) hypertension: Secondary | ICD-10-CM | POA: Diagnosis not present

## 2023-12-10 DIAGNOSIS — Z66 Do not resuscitate: Secondary | ICD-10-CM | POA: Diagnosis present

## 2023-12-10 DIAGNOSIS — E785 Hyperlipidemia, unspecified: Secondary | ICD-10-CM | POA: Diagnosis present

## 2023-12-10 DIAGNOSIS — J189 Pneumonia, unspecified organism: Secondary | ICD-10-CM | POA: Diagnosis present

## 2023-12-10 DIAGNOSIS — Z85118 Personal history of other malignant neoplasm of bronchus and lung: Secondary | ICD-10-CM

## 2023-12-10 DIAGNOSIS — Z9221 Personal history of antineoplastic chemotherapy: Secondary | ICD-10-CM | POA: Diagnosis not present

## 2023-12-10 DIAGNOSIS — A419 Sepsis, unspecified organism: Secondary | ICD-10-CM | POA: Diagnosis not present

## 2023-12-10 DIAGNOSIS — Z4682 Encounter for fitting and adjustment of non-vascular catheter: Secondary | ICD-10-CM | POA: Diagnosis not present

## 2023-12-10 DIAGNOSIS — Z90710 Acquired absence of both cervix and uterus: Secondary | ICD-10-CM

## 2023-12-10 DIAGNOSIS — Z7982 Long term (current) use of aspirin: Secondary | ICD-10-CM

## 2023-12-10 DIAGNOSIS — J439 Emphysema, unspecified: Secondary | ICD-10-CM | POA: Diagnosis not present

## 2023-12-10 DIAGNOSIS — I132 Hypertensive heart and chronic kidney disease with heart failure and with stage 5 chronic kidney disease, or end stage renal disease: Secondary | ICD-10-CM | POA: Diagnosis not present

## 2023-12-10 DIAGNOSIS — D84821 Immunodeficiency due to drugs: Secondary | ICD-10-CM | POA: Diagnosis not present

## 2023-12-10 DIAGNOSIS — D696 Thrombocytopenia, unspecified: Secondary | ICD-10-CM | POA: Diagnosis not present

## 2023-12-10 DIAGNOSIS — R918 Other nonspecific abnormal finding of lung field: Secondary | ICD-10-CM | POA: Diagnosis not present

## 2023-12-10 DIAGNOSIS — E119 Type 2 diabetes mellitus without complications: Secondary | ICD-10-CM

## 2023-12-10 DIAGNOSIS — J440 Chronic obstructive pulmonary disease with acute lower respiratory infection: Secondary | ICD-10-CM | POA: Diagnosis not present

## 2023-12-10 DIAGNOSIS — Z8249 Family history of ischemic heart disease and other diseases of the circulatory system: Secondary | ICD-10-CM

## 2023-12-10 DIAGNOSIS — D649 Anemia, unspecified: Secondary | ICD-10-CM | POA: Diagnosis present

## 2023-12-10 DIAGNOSIS — R069 Unspecified abnormalities of breathing: Secondary | ICD-10-CM | POA: Diagnosis not present

## 2023-12-10 DIAGNOSIS — Z794 Long term (current) use of insulin: Secondary | ICD-10-CM

## 2023-12-10 DIAGNOSIS — I5042 Chronic combined systolic (congestive) and diastolic (congestive) heart failure: Secondary | ICD-10-CM | POA: Diagnosis present

## 2023-12-10 DIAGNOSIS — R0902 Hypoxemia: Secondary | ICD-10-CM | POA: Diagnosis not present

## 2023-12-10 DIAGNOSIS — Z87891 Personal history of nicotine dependence: Secondary | ICD-10-CM | POA: Diagnosis not present

## 2023-12-10 DIAGNOSIS — J690 Pneumonitis due to inhalation of food and vomit: Secondary | ICD-10-CM | POA: Diagnosis not present

## 2023-12-10 DIAGNOSIS — J449 Chronic obstructive pulmonary disease, unspecified: Secondary | ICD-10-CM | POA: Diagnosis present

## 2023-12-10 DIAGNOSIS — J90 Pleural effusion, not elsewhere classified: Secondary | ICD-10-CM | POA: Diagnosis not present

## 2023-12-10 DIAGNOSIS — J80 Acute respiratory distress syndrome: Secondary | ICD-10-CM | POA: Diagnosis not present

## 2023-12-10 DIAGNOSIS — Z809 Family history of malignant neoplasm, unspecified: Secondary | ICD-10-CM

## 2023-12-10 DIAGNOSIS — E873 Alkalosis: Secondary | ICD-10-CM | POA: Diagnosis present

## 2023-12-10 DIAGNOSIS — Z833 Family history of diabetes mellitus: Secondary | ICD-10-CM | POA: Diagnosis not present

## 2023-12-10 DIAGNOSIS — J441 Chronic obstructive pulmonary disease with (acute) exacerbation: Principal | ICD-10-CM

## 2023-12-10 DIAGNOSIS — R0689 Other abnormalities of breathing: Secondary | ICD-10-CM | POA: Diagnosis not present

## 2023-12-10 DIAGNOSIS — N186 End stage renal disease: Secondary | ICD-10-CM | POA: Diagnosis not present

## 2023-12-10 DIAGNOSIS — J9601 Acute respiratory failure with hypoxia: Secondary | ICD-10-CM

## 2023-12-10 DIAGNOSIS — Z1152 Encounter for screening for COVID-19: Secondary | ICD-10-CM | POA: Diagnosis not present

## 2023-12-10 DIAGNOSIS — R0603 Acute respiratory distress: Secondary | ICD-10-CM | POA: Diagnosis not present

## 2023-12-10 DIAGNOSIS — R6510 Systemic inflammatory response syndrome (SIRS) of non-infectious origin without acute organ dysfunction: Secondary | ICD-10-CM | POA: Diagnosis not present

## 2023-12-10 DIAGNOSIS — Z923 Personal history of irradiation: Secondary | ICD-10-CM | POA: Diagnosis not present

## 2023-12-10 DIAGNOSIS — Z7960 Long term (current) use of unspecified immunomodulators and immunosuppressants: Secondary | ICD-10-CM

## 2023-12-10 DIAGNOSIS — Z940 Kidney transplant status: Secondary | ICD-10-CM | POA: Diagnosis not present

## 2023-12-10 DIAGNOSIS — Z79899 Other long term (current) drug therapy: Secondary | ICD-10-CM

## 2023-12-10 DIAGNOSIS — Z803 Family history of malignant neoplasm of breast: Secondary | ICD-10-CM

## 2023-12-10 DIAGNOSIS — Z955 Presence of coronary angioplasty implant and graft: Secondary | ICD-10-CM

## 2023-12-10 DIAGNOSIS — E1129 Type 2 diabetes mellitus with other diabetic kidney complication: Secondary | ICD-10-CM | POA: Diagnosis not present

## 2023-12-10 DIAGNOSIS — I2510 Atherosclerotic heart disease of native coronary artery without angina pectoris: Secondary | ICD-10-CM | POA: Diagnosis not present

## 2023-12-10 LAB — I-STAT ARTERIAL BLOOD GAS, ED
Acid-Base Excess: 8 mmol/L — ABNORMAL HIGH (ref 0.0–2.0)
Bicarbonate: 30.3 mmol/L — ABNORMAL HIGH (ref 20.0–28.0)
Calcium, Ion: 1.2 mmol/L (ref 1.15–1.40)
HCT: 23 % — ABNORMAL LOW (ref 36.0–46.0)
Hemoglobin: 7.8 g/dL — ABNORMAL LOW (ref 12.0–15.0)
O2 Saturation: 93 %
Patient temperature: 99.3
Potassium: 4.1 mmol/L (ref 3.5–5.1)
Sodium: 127 mmol/L — ABNORMAL LOW (ref 135–145)
TCO2: 31 mmol/L (ref 22–32)
pCO2 arterial: 33.5 mmHg (ref 32–48)
pH, Arterial: 7.565 — ABNORMAL HIGH (ref 7.35–7.45)
pO2, Arterial: 57 mmHg — ABNORMAL LOW (ref 83–108)

## 2023-12-10 LAB — I-STAT VENOUS BLOOD GAS, ED
Acid-Base Excess: 6 mmol/L — ABNORMAL HIGH (ref 0.0–2.0)
Bicarbonate: 31.8 mmol/L — ABNORMAL HIGH (ref 20.0–28.0)
Calcium, Ion: 1.16 mmol/L (ref 1.15–1.40)
HCT: 26 % — ABNORMAL LOW (ref 36.0–46.0)
Hemoglobin: 8.8 g/dL — ABNORMAL LOW (ref 12.0–15.0)
O2 Saturation: 92 %
Potassium: 4.7 mmol/L (ref 3.5–5.1)
Sodium: 132 mmol/L — ABNORMAL LOW (ref 135–145)
TCO2: 33 mmol/L — ABNORMAL HIGH (ref 22–32)
pCO2, Ven: 55.2 mmHg (ref 44–60)
pH, Ven: 7.368 (ref 7.25–7.43)
pO2, Ven: 67 mmHg — ABNORMAL HIGH (ref 32–45)

## 2023-12-10 LAB — CBC
HCT: 30.6 % — ABNORMAL LOW (ref 36.0–46.0)
Hemoglobin: 9.2 g/dL — ABNORMAL LOW (ref 12.0–15.0)
MCH: 28.1 pg (ref 26.0–34.0)
MCHC: 30.1 g/dL (ref 30.0–36.0)
MCV: 93.6 fL (ref 80.0–100.0)
Platelets: 79 K/uL — ABNORMAL LOW (ref 150–400)
RBC: 3.27 MIL/uL — ABNORMAL LOW (ref 3.87–5.11)
RDW: 17.3 % — ABNORMAL HIGH (ref 11.5–15.5)
WBC: 6.3 K/uL (ref 4.0–10.5)
nRBC: 0 % (ref 0.0–0.2)

## 2023-12-10 LAB — COMPREHENSIVE METABOLIC PANEL WITH GFR
ALT: 21 U/L (ref 0–44)
AST: 18 U/L (ref 15–41)
Albumin: 1.7 g/dL — ABNORMAL LOW (ref 3.5–5.0)
Alkaline Phosphatase: 74 U/L (ref 38–126)
Anion gap: 9 (ref 5–15)
BUN: 20 mg/dL (ref 8–23)
CO2: 28 mmol/L (ref 22–32)
Calcium: 8.5 mg/dL — ABNORMAL LOW (ref 8.9–10.3)
Chloride: 97 mmol/L — ABNORMAL LOW (ref 98–111)
Creatinine, Ser: 0.58 mg/dL (ref 0.44–1.00)
GFR, Estimated: >60 mL/min (ref >60)
Glucose, Bld: 172 mg/dL — ABNORMAL HIGH (ref 70–99)
Potassium: 4.8 mmol/L (ref 3.5–5.1)
Sodium: 134 mmol/L — ABNORMAL LOW (ref 135–145)
Total Bilirubin: 0.7 mg/dL (ref 0.0–1.2)
Total Protein: 5 g/dL — ABNORMAL LOW (ref 6.5–8.1)

## 2023-12-10 LAB — I-STAT CHEM 8, ED
BUN: 22 mg/dL (ref 8–23)
Calcium, Ion: 1.14 mmol/L — ABNORMAL LOW (ref 1.15–1.40)
Chloride: 97 mmol/L — ABNORMAL LOW (ref 98–111)
Creatinine, Ser: 0.6 mg/dL (ref 0.44–1.00)
Glucose, Bld: 177 mg/dL — ABNORMAL HIGH (ref 70–99)
HCT: 27 % — ABNORMAL LOW (ref 36.0–46.0)
Hemoglobin: 9.2 g/dL — ABNORMAL LOW (ref 12.0–15.0)
Potassium: 4.7 mmol/L (ref 3.5–5.1)
Sodium: 132 mmol/L — ABNORMAL LOW (ref 135–145)
TCO2: 30 mmol/L (ref 22–32)

## 2023-12-10 LAB — MRSA NEXT GEN BY PCR, NASAL: MRSA by PCR Next Gen: NOT DETECTED

## 2023-12-10 LAB — RESP PANEL BY RT-PCR (RSV, FLU A&B, COVID)  RVPGX2
Influenza A by PCR: NEGATIVE
Influenza B by PCR: NEGATIVE
Resp Syncytial Virus by PCR: NEGATIVE
SARS Coronavirus 2 by RT PCR: NEGATIVE

## 2023-12-10 LAB — CBG MONITORING, ED: Glucose-Capillary: 184 mg/dL — ABNORMAL HIGH (ref 70–99)

## 2023-12-10 LAB — TROPONIN I (HIGH SENSITIVITY): Troponin I (High Sensitivity): 24 ng/L — ABNORMAL HIGH (ref <18)

## 2023-12-10 LAB — GLUCOSE, CAPILLARY: Glucose-Capillary: 151 mg/dL — ABNORMAL HIGH (ref 70–99)

## 2023-12-10 LAB — LACTIC ACID, PLASMA: Lactic Acid, Venous: 1.2 mmol/L (ref 0.5–1.9)

## 2023-12-10 LAB — BRAIN NATRIURETIC PEPTIDE: B Natriuretic Peptide: 470.8 pg/mL — ABNORMAL HIGH (ref 0.0–100.0)

## 2023-12-10 MED ORDER — BUDESONIDE 0.25 MG/2ML IN SUSP: 0.2500 mg | Freq: Two times a day (BID) | RESPIRATORY_TRACT | Status: DC

## 2023-12-10 MED ORDER — ARFORMOTEROL TARTRATE 15 MCG/2ML IN NEBU: 15.0000 ug | INHALATION_SOLUTION | Freq: Two times a day (BID) | RESPIRATORY_TRACT | Status: DC

## 2023-12-10 MED ORDER — PROPOFOL 1000 MG/100ML IV EMUL
0.0000 ug/kg/min | INTRAVENOUS | Status: DC
  Administered 2023-12-10: 20 ug/kg/min via INTRAVENOUS
  Filled 2023-12-10: qty 100

## 2023-12-10 MED ORDER — IOHEXOL 350 MG/ML SOLN
53.0000 mL | Freq: Once | INTRAVENOUS | Status: AC | PRN
  Administered 2023-12-10: 53 mL via INTRAVENOUS

## 2023-12-10 MED ORDER — ETOMIDATE 2 MG/ML IV SOLN
INTRAVENOUS | Status: AC | PRN
  Administered 2023-12-10: 20 mg via INTRAVENOUS

## 2023-12-10 MED ORDER — REVEFENACIN 175 MCG/3ML IN SOLN: 175.0000 ug | Freq: Every day | RESPIRATORY_TRACT | Status: DC

## 2023-12-10 MED ORDER — FENTANYL 2500MCG IN NS 250ML (10MCG/ML) PREMIX INFUSION
0.0000 ug/h | INTRAVENOUS | Status: DC
  Administered 2023-12-10: 25 ug/h via INTRAVENOUS
  Filled 2023-12-10: qty 250

## 2023-12-10 MED ORDER — DOCUSATE SODIUM 50 MG/5ML PO LIQD: 100.0000 mg | Freq: Two times a day (BID) | ORAL | Status: DC | PRN

## 2023-12-10 MED ORDER — CHLORHEXIDINE GLUCONATE CLOTH 2 % EX PADS
6.0000 | MEDICATED_PAD | Freq: Every day | CUTANEOUS | Status: DC
  Administered 2023-12-10: 6 via TOPICAL

## 2023-12-10 MED ORDER — HYDROCORTISONE SOD SUC (PF) 100 MG IJ SOLR
100.0000 mg | Freq: Two times a day (BID) | INTRAMUSCULAR | Status: DC
  Administered 2023-12-10: 100 mg via INTRAVENOUS
  Filled 2023-12-10: qty 2

## 2023-12-10 MED ORDER — ROCURONIUM BROMIDE 10 MG/ML (PF) SYRINGE
PREFILLED_SYRINGE | INTRAVENOUS | Status: AC | PRN
  Administered 2023-12-10: 75 mg via INTRAVENOUS

## 2023-12-10 MED ORDER — PIPERACILLIN-TAZOBACTAM 3.375 G IVPB 30 MIN
3.3750 g | Freq: Once | INTRAVENOUS | Status: AC
  Administered 2023-12-10: 3.375 g via INTRAVENOUS
  Filled 2023-12-10: qty 50

## 2023-12-10 MED ORDER — PIPERACILLIN-TAZOBACTAM 3.375 G IVPB: 3.3750 g | Freq: Three times a day (TID) | INTRAVENOUS | Status: DC

## 2023-12-10 MED ORDER — VANCOMYCIN HCL 1500 MG/300ML IV SOLN
1500.0000 mg | Freq: Once | INTRAVENOUS | Status: AC
  Administered 2023-12-10: 1500 mg via INTRAVENOUS
  Filled 2023-12-10: qty 300

## 2023-12-10 MED ORDER — FENTANYL BOLUS VIA INFUSION: 25.0000 ug | INTRAVENOUS | Status: DC | PRN

## 2023-12-10 MED ORDER — SODIUM CHLORIDE 0.9 % IV SOLN: 2.0000 g | Freq: Once | INTRAVENOUS | Status: DC

## 2023-12-10 MED ORDER — POLYETHYLENE GLYCOL 3350 17 G PO PACK: 17.0000 g | PACK | Freq: Every day | ORAL | Status: DC | PRN

## 2023-12-10 MED ORDER — ENOXAPARIN SODIUM 40 MG/0.4ML IJ SOSY
40.0000 mg | PREFILLED_SYRINGE | INTRAMUSCULAR | Status: DC
  Administered 2023-12-10: 40 mg via SUBCUTANEOUS
  Filled 2023-12-10: qty 0.4

## 2023-12-10 MED ORDER — ALBUTEROL SULFATE (2.5 MG/3ML) 0.083% IN NEBU
10.0000 mg/h | INHALATION_SOLUTION | Freq: Once | RESPIRATORY_TRACT | Status: AC
  Administered 2023-12-10: 10 mg/h via RESPIRATORY_TRACT
  Filled 2023-12-10: qty 12

## 2023-12-10 MED ORDER — VANCOMYCIN HCL IN DEXTROSE 1-5 GM/200ML-% IV SOLN: 1000.0000 mg | Freq: Once | INTRAVENOUS | Status: DC

## 2023-12-10 MED ORDER — FENTANYL CITRATE PF 50 MCG/ML IJ SOSY: 25.0000 ug | PREFILLED_SYRINGE | Freq: Once | INTRAMUSCULAR | Status: DC

## 2023-12-10 MED ORDER — INSULIN ASPART 100 UNIT/ML IJ SOLN
0.0000 [IU] | INTRAMUSCULAR | Status: DC
  Administered 2023-12-10: 3 [IU] via SUBCUTANEOUS

## 2023-12-10 MED ORDER — FAMOTIDINE 20 MG PO TABS: 20.0000 mg | ORAL_TABLET | Freq: Two times a day (BID) | ORAL | Status: DC

## 2023-12-11 LAB — BLOOD CULTURE ID PANEL (REFLEXED) - BCID2

## 2023-12-12 NOTE — ED Notes (Signed)
Merrily Pew, MD at bedside.

## 2023-12-12 NOTE — Code Documentation (Signed)
 Pt intubated with 7.5 ET, 20 @ lip, bil breath sounds per EDP [place].

## 2023-12-12 NOTE — ED Triage Notes (Signed)
 Pt BIB [facility] from home d/t SOB that worsened yesterday since she was recently admitted & d/c from hospital for the same complaint. EMS reports she was Alert & verbal on scene, 61% on RA. EMS gave Duo Neb, Albuterol , 2 of Mag & Solu-Medrol  while en route & was on CPAP upon arrival.

## 2023-12-12 NOTE — Progress Notes (Signed)
 Pt transported to CT and then to 3M09 from TRA A via vent without complications.

## 2023-12-12 NOTE — Progress Notes (Signed)
 Pts O2 sats were 85 when arrived to unit, called MD turned PEEP to 12.  Pts sats did not improve.  Called E-link with no answer, called ground team.  Started to bag pt when ground team arrived pt went asystole.

## 2023-12-12 NOTE — Death Summary Note (Signed)
 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    HPI obtained through EDP, chart review as patient is intubated, sedated, no family present. Husband reported patient has decompensated over the past 2 days. Noted she looked ill so he call 911. Per EMS, sats were 50s on room air and placed her on BiPAP without much improvement. She was obtunded on arrival to ED and patient was intubated. Labs at time of admission pending. CXR obtained show new RLL infilatrate, what appears to be left pleural effusion, and left cavitary lesion  Patient was  admitted to ICU, she continued to remain hypoxic despite high vent setting including FiO2 of 100% and PEEP 12, upon arrival to ICU she had increasing hypoxia, BVM was started but patient went into asystole she was given epinephrine  x 3, bicarbonate x 1, CPR was not performed as patient was DNR.  After 3 epinephrine  patient remained asystolic, she was declared dead on 17-Dec-2023 at 7:14 p.m.  Patient family was at bedside    Pertinent Labs and Studies  Significant Diagnostic Studies CT Angio Chest PE W and/or Wo Contrast Result Date: 2023-12-17 CLINICAL DATA:  Respiratory distress. EXAM: CT ANGIOGRAPHY CHEST WITH CONTRAST TECHNIQUE: Multidetector CT imaging of the chest was performed using the standard protocol during bolus administration of intravenous contrast. Multiplanar CT image reconstructions and MIPs were obtained to evaluate the vascular anatomy. RADIATION DOSE REDUCTION: This exam was performed according to the departmental dose-optimization program which includes automated exposure control, adjustment of the mA and/or kV according to patient size and/or use of iterative reconstruction technique. CONTRAST:  53mL OMNIPAQUE  IOHEXOL  350 MG/ML SOLN COMPARISON:  November 17, 2023.  History of lung cancer. FINDINGS: Cardiovascular: Satisfactory opacification of the pulmonary arteries to the segmental level. No evidence of pulmonary embolism. Normal heart size. No pericardial effusion. Coronary artery calcifications are noted. Mediastinum/Nodes: Nasogastric tube is seen entering stomach. Endotracheal tube is in grossly good position. Thyroid gland is unremarkable. No definite adenopathy is noted. Lungs/Pleura: Small bilateral pleural effusions are noted, left greater than right. No pneumothorax is noted. Emphysematous disease is noted. Large left upper and lower lobe opacities are noted concerning for pneumonia. Large right lower lobe opacity is noted also concerning for pneumonia. Cavitary lesion seen in left  lower lobe  on prior exam is not visualized due to surrounding consolidation. Upper Abdomen: No acute abnormality. Musculoskeletal: No chest wall abnormality. No acute or significant osseous findings. Review of the MIP images confirms the above findings. IMPRESSION: No definite evidence of pulmonary embolus. Large bilateral lung opacities are noted with associated pleural effusions, left greater than right, most consistent with severe multifocal pneumonia. Endotracheal and nasogastric tubes are in grossly good position. Aortic Atherosclerosis (ICD10-I70.0) and Emphysema (ICD10-J43.9). Electronically Signed   By: Lynwood Landy Raddle M.D.   On: December 26, 2023 18:43   DG Abdomen 1 View Result Date: 12-26-23 CLINICAL DATA:  OG tube EXAM: ABDOMEN - 1 VIEW COMPARISON:  Sep 29, 2023 FINDINGS: Esophagogastric tube terminates in the region of the gastric antrum or duodenal bulb. Nonobstructive bowel gas pattern. No pneumoperitoneum. No organomegaly or radiopaque calculi. No acute fracture or destructive lesion. The lung bases are clear. IMPRESSION: 1. Esophagogastric tube terminates in the region of the gastric antrum or duodenal bulb. 2. Nonobstructive bowel gas pattern. Electronically Signed   By: Rogelia Myers M.D.   On: Dec 26, 2023 17:34   DG Chest Port 1 View Result Date: December 26, 2023 CLINICAL DATA:  resp distress EXAM: PORTABLE CHEST - 1 VIEW COMPARISON:  November 29, 2023, November 17, 2023 FINDINGS: Endotracheal tube terminates in the mid trachea. Esophagogastric tube courses below the diaphragm with the distal tip not included in the field of view. Diffuse interstitial opacities throughout both lungs. Patchy airspace opacities in the right lung base and left mid lung. Moderate left pleural effusion with trace right pleural effusion. No pneumothorax. Cavitary mass in the left lower lobe on the prior CT is not well visualized. No cardiomegaly. Tortuous aorta with aortic atherosclerosis. No acute fracture or destructive lesions.  Multilevel thoracic osteophytosis. Multiple vascular stents in the left axilla. IMPRESSION: 1. Well-positioned endotracheal tube. Esophagogastric tube courses below the diaphragm with the distal tip not included in the field of view. 2. Lung findings of either asymmetric multifocal pneumonia or asymmetric pulmonary edema. Moderate left pleural effusion with trace right pleural effusion. Electronically Signed   By: Rogelia Myers M.D.   On: 12-26-23 17:23   DG CHEST PORT 1 VIEW Result Date: 11/29/2023 CLINICAL DATA:  Respiratory distress.  History of lung carcinoma. EXAM: PORTABLE CHEST 1 VIEW COMPARISON:  11/24/2023 and older studies. FINDINGS: Coarse interstitial thickening, left mid to lower lung with intervening hazy opacities. Similar but less prominent appearance at the right lung base. Cavitary mass noted on prior imaging lateral to the left hilum is less apparent, difference likely technical. Remainder of the lungs is clear. Cardiac silhouette is normal in size. Stable left hilar prominence. No mediastinal or right hilar masses. No convincing pleural effusion or pneumothorax. Stable left upper extremity vascular stent. IMPRESSION: 1. No acute findings. 2. Left lower lobe, perihilar cavitary mass is less well-defined than on the prior exams. Interstitial and hazy lung opacities detailed above are without significant change. Lung opacities may reflect infection or post treatment related inflammation. Electronically Signed   By: Alm Parkins M.D.   On: 11/29/2023 12:32   DG CHEST PORT 1 VIEW Result Date: 11/24/2023 CLINICAL DATA:  Radiation pneumonitis. EXAM: PORTABLE CHEST 1 VIEW COMPARISON:  November 19, 2023.  November 17, 2023. FINDINGS: The heart size and mediastinal contours are within normal limits. Right lung is clear. Cavitary abnormality is noted in left perihilar region corresponding to walled cavitary mass noted on previous CT scan in superior segment of left lower lobe consistent with history of  lung  cancer. Stable reticular densities are noted throughout the left lung base which may represent radiation fibrosis. The visualized skeletal structures are unremarkable. IMPRESSION: Cavitary abnormality seen in left lower lobe corresponding to thick-walled cavitary mass or malignancy noted on prior CT scan. 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 Electronically Signed   By: Lynwood Landy Raddle M.D.   On: 11/24/2023 10:40   ECHOCARDIOGRAM COMPLETE Result Date: 11/19/2023    ECHOCARDIOGRAM REPORT   Patient Name:   SAMYUKTA CURA Date of Exam: 11/19/2023 Medical Rec #:  [id number]        Height:       61.5 in Accession #:    [id number]       Weight:       160.0 lb Date of Birth:  1950-12-01       BSA:          1.728 m Patient Age:    72 years         BP:           127/63 mmHg Patient Gender: F                HR:           85 bpm. Exam Location:  Inpatient Procedure: 2D Echo, Cardiac Doppler and Color Doppler (Both Spectral and Color            Flow Doppler were utilized during procedure). Indications:    CHF  History:        Patient has prior history of Echocardiogram examinations, most                 recent 01/26/2019. Acute Respiratory Failure.  Sonographer:    Benard Stallion Referring Phys: [id number] ALLISON L ELLIS IMPRESSIONS  1. Left ventricular ejection fraction, by estimation, is 55 to 60%. The left ventricle has normal function. The left ventricle has no regional wall motion abnormalities. Left ventricular diastolic parameters are consistent with Grade I diastolic dysfunction (impaired relaxation). Elevated left atrial pressure.  2. Right ventricular systolic function is normal. The right ventricular size is normal. Tricuspid regurgitation signal is inadequate for assessing PA pressure.  3. The mitral valve is normal in structure. Trivial mitral valve regurgitation. No evidence of mitral stenosis.  4. The aortic valve is tricuspid. Aortic valve regurgitation is not visualized.  No aortic stenosis is present.  5. The inferior vena cava is normal in size with greater than 50% respiratory variability, suggesting right atrial pressure of 3 mmHg. FINDINGS  Left Ventricle: Left ventricular ejection fraction, by estimation, is 55 to 60%. The left ventricle has normal function. The left ventricle has no regional wall motion abnormalities. The left ventricular internal cavity size was normal in size. There is  no left ventricular hypertrophy. Left ventricular diastolic parameters are consistent with Grade I diastolic dysfunction (impaired relaxation). Elevated left atrial pressure. Right Ventricle: The right ventricular size is normal. Right ventricular systolic function is normal. Tricuspid regurgitation signal is inadequate for assessing PA pressure. The tricuspid regurgitant velocity is 2.54 m/s, and with an assumed right atrial  pressure of 3 mmHg, the estimated right ventricular systolic pressure is 28.8 mmHg. Left Atrium: Left atrial size was normal in size. Right Atrium: Right atrial size was normal in size. Pericardium: There is no evidence of pericardial effusion. Mitral Valve: The mitral valve is normal in structure. Trivial mitral valve regurgitation. No evidence of mitral valve stenosis. Tricuspid Valve: The tricuspid valve is normal in structure. Tricuspid  valve regurgitation is trivial. No evidence of tricuspid stenosis. Aortic Valve: The aortic valve is tricuspid. Aortic valve regurgitation is not visualized. No aortic stenosis is present. Aortic valve mean gradient measures 4.0 mmHg. Aortic valve peak gradient measures 7.2 mmHg. Pulmonic Valve: The pulmonic valve was normal in structure. Pulmonic valve regurgitation is not visualized. No evidence of pulmonic stenosis. Aorta: The aortic root is normal in size and structure. Venous: The inferior vena cava is normal in size with greater than 50% respiratory variability, suggesting right atrial pressure of 3 mmHg. IAS/Shunts: The  interatrial septum is aneurysmal. No atrial level shunt detected by color flow Doppler.  LEFT VENTRICLE PLAX 2D LVIDd:         4.30 cm   Diastology LVIDs:         2.80 cm   LV e' medial:    5.44 cm/s LV PW:         0.90 cm   LV E/e' medial:  17.2 LV IVS:        0.90 cm   LV e' lateral:   7.29 cm/s LVOT diam:     1.80 cm   LV E/e' lateral: 12.9 LV SV:         50 LV SV Index:   29 LVOT Area:     2.54 cm  RIGHT VENTRICLE RV Basal diam:  2.20 cm RV Mid diam:    1.80 cm RV S prime:     11.40 cm/s TAPSE (M-mode): 2.0 cm LEFT ATRIUM             Index        RIGHT ATRIUM          Index LA diam:        3.20 cm 1.85 cm/m   RA Area:     8.33 cm LA Vol (A2C):   34.1 ml 19.73 ml/m  RA Volume:   11.80 ml 6.83 ml/m LA Vol (A4C):   44.9 ml 25.98 ml/m LA Biplane Vol: 41.2 ml 23.84 ml/m  AORTIC VALVE AV Area (Vmax):    1.56 cm AV Area (Vmean):   1.48 cm AV Area (VTI):     1.99 cm AV Vmax:           134.00 cm/s AV Vmean:          95.000 cm/s AV VTI:            0.251 m AV Peak Grad:      7.2 mmHg AV Mean Grad:      4.0 mmHg LVOT Vmax:         82.30 cm/s LVOT Vmean:        55.100 cm/s LVOT VTI:          0.196 m LVOT/AV VTI ratio: 0.78  AORTA Ao Root diam: 2.60 cm Ao Asc diam:  2.90 cm MITRAL VALVE                TRICUSPID VALVE MV Area (PHT): 4.80 cm     TR Peak grad:   25.8 mmHg MV Decel Time: 158 msec     TR Vmax:        254.00 cm/s MV E velocity: 93.80 cm/s MV A velocity: 138.00 cm/s  SHUNTS MV E/A ratio:  0.68         Systemic VTI:  0.20 m  Systemic Diam: 1.80 cm Redell Shallow MD Electronically signed by Redell Shallow MD Signature Date/Time: 11/19/2023/2:09:35 PM    Final    DG Chest Portable 1 View Result Date: 11/19/2023 CLINICAL DATA:  Shortness of breath. EXAM: PORTABLE CHEST 1 VIEW COMPARISON:  August 18, 2023.  November 17, 2023. FINDINGS: Normal cardiac size. Right lung is clear. Left perihilar density is noted which corresponds to cavitary mass noted on prior CT scan. Left lower lobe airspace  opacity is noted most consistent with pneumonia or atelectasis. The visualized skeletal structures are unremarkable. IMPRESSION: Left perihilar density is noted which corresponds to cavitary mass seen on recent CT scan. Large left lower lobe airspace opacity is noted most consistent with postobstructive pneumonia or atelectasis. Electronically Signed   By: Lynwood Landy Raddle M.D.   On: 11/19/2023 10:15   CT Chest W Contrast Result Date: 11/17/2023 EXAM: CT CHEST WITH CONTRAST 11/17/2023 10:13:11 AM TECHNIQUE: CT of the chest was performed with the administration of intravenous contrast. Multiplanar reformatted images are provided for review. Automated exposure control, iterative reconstruction, and/or weight based adjustment of the mA/kV was utilized to reduce the radiation dose to as low as reasonably achievable. COMPARISON: PET/CT dated 09/04/2023. CLINICAL HISTORY: Non-small cell lung cancer (NSCLC), staging. Patient for f/u non small cell lung cancer; Sx to chest. FINDINGS: MEDIASTINUM: Heart and pericardium are unremarkable. The central airways are clear. Moderate 3-vessel coronary atherosclerosis. Thoracic aortic atherosclerosis. LYMPH NODES: No mediastinal, hilar or axillary lymphadenopathy. LUNGS AND PLEURA: 5.1 x 4.5 cm thick-walled cavitary mass in the superior segment left lower lobe (image 59), improved, now with progressive central gas. Multifocal patchy opacities in the left hemithorax (image 78), new, suggesting radiation changes. Moderate centrilobular and paraseptal emphysematous changes, upper lung predominant. No pleural effusion or pneumothorax. SOFT TISSUES/BONES: Mild degenerative changes of the visualized thoracolumbar spine. No acute abnormality of the soft tissues. UPPER ABDOMEN: Status post cholecystectomy with pneumobilia. Severe bilateral renal cortical scarring/atrophy with a dominant 1.6 cm right upper pole simple cyst (image 137), benign. No follow up is recommended. IMPRESSION: 1. 5.1 x  4.5 cm thick-walled cavitary mass in the superior segment left lower lobe, corresponding to the patient's known primary bronchogenic carcinoma, improved. 2. Suspected radiation changes in the left hemithorax. Electronically signed by: Pinkie Pebbles MD 11/17/2023 08:17 PM EDT RP Workstation: [id number]    Microbiology Recent Results (from the past 240 hours)  Resp panel by RT-PCR (RSV, Flu A&B, Covid) Anterior Nasal Swab     Status: None   Collection Time: January 01, 2024  5:15 PM   Specimen: Anterior Nasal Swab  Result Value Ref Range Status   SARS Coronavirus 2 by RT PCR NEGATIVE NEGATIVE Final   Influenza A by PCR NEGATIVE NEGATIVE Final   Influenza B by PCR NEGATIVE NEGATIVE Final    Comment: (NOTE) The Xpert Xpress SARS-CoV-2/FLU/RSV plus assay is intended as an aid in the diagnosis of influenza from Nasopharyngeal swab specimens and should not be used as a sole basis for treatment. Nasal washings and aspirates are unacceptable for Xpert Xpress SARS-CoV-2/FLU/RSV testing.  Fact Sheet for Patients: BloggerCourse.com  Fact Sheet for Healthcare Providers: SeriousBroker.it  This test is not yet approved or cleared by the [place]  FDA and has been authorized for detection and/or diagnosis of SARS-CoV-2 by FDA under an Emergency Use Authorization (EUA). This EUA will remain in effect (meaning this test can be used) for the duration of the COVID-19 declaration under Section 564(b)(1) of the Act, 21 U.S.C. section 360bbb-3(b)(1), unless the  authorization is terminated or revoked.     Resp Syncytial Virus by PCR NEGATIVE NEGATIVE Final    Comment: (NOTE) Fact Sheet for Patients: BloggerCourse.com  Fact Sheet for Healthcare Providers: SeriousBroker.it  This test is not yet approved or cleared by the [place]  FDA and has been authorized for detection and/or diagnosis of  SARS-CoV-2 by FDA under an Emergency Use Authorization (EUA). This EUA will remain in effect (meaning this test can be used) for the duration of the COVID-19 declaration under Section 564(b)(1) of the Act, 21 U.S.C. section 360bbb-3(b)(1), unless the authorization is terminated or revoked.  Performed at [facility], [address], [place], [place] [address]   MRSA Next Gen by PCR, Nasal     Status: None   Collection Time: 30-Dec-2023  5:40 PM   Specimen: Nasal Mucosa; Nasal Swab  Result Value Ref Range Status   MRSA by PCR Next Gen NOT DETECTED NOT DETECTED Final    Comment: (NOTE) The GeneXpert MRSA Assay (FDA approved for NASAL specimens only), is one component of a comprehensive MRSA colonization surveillance program. It is not intended to diagnose MRSA infection nor to guide or monitor treatment for MRSA infections. Test performance is not FDA approved in patients less than 56 years old. Performed at [facility], [address], [place], [place] [address]     Lab Basic Metabolic Panel: Recent Labs  Lab 2023-12-30 1706 Dec 30, 2023 1717 12/30/23 1753 12/30/2023 1809  NA 134* 132* 132* 127*  K 4.8 4.7 4.7 4.1  CL 97*  --  97*  --   CO2 28  --   --   --   GLUCOSE 172*  --  177*  --   BUN 20  --  22  --   CREATININE 0.58  --  0.60  --   CALCIUM  8.5*  --   --   --    Liver Function Tests: Recent Labs  Lab 12-30-2023 1706  AST 18  ALT 21  ALKPHOS 74  BILITOT 0.7  PROT 5.0*  ALBUMIN 1.7*   No results for input(s): LIPASE, AMYLASE in the last 168 hours. No results for input(s): AMMONIA in the last 168 hours. CBC: Recent Labs  Lab Dec 30, 2023 1706 2023/12/30 1717 12/30/2023 1753 30-Dec-2023 1809  WBC 6.3  --   --   --   HGB 9.2* 8.8* 9.2* 7.8*  HCT 30.6* 26.0* 27.0* 23.0*  MCV 93.6  --   --   --   PLT 79*  --   --   --    Cardiac Enzymes: No results for input(s): CKTOTAL, CKMB, CKMBINDEX, TROPONINI in the last 168 hours. Sepsis Labs: Recent  Labs  Lab December 30, 2023 1706 Dec 30, 2023 1742  WBC 6.3  --   LATICACIDVEN  --  1.2    Procedures/Operations     Kamela Blansett 12/11/2023, 7:43 AM

## 2023-12-12 NOTE — ED Provider Notes (Signed)
 Pamela [name] Provider Note   CSN: [id number] Arrival date & time: 12/13/2023  1652     Patient presents with: Respiratory Distress   Pamela [name] is a 73 y.o. female.   73 year old female with history of lung cancer in remission, COPD, hypertension, hyperlipidemia, CAD, renal transplant, and heart failure with reduced EF who presents to the emergency department respiratory distress.  History obtained per patient's husband.  Reports that she was discharged from the hospital recently and was at home and not doing well.  Over the past 2 days she started to decompensate.  Today she was looking very ill so he called 911.  When EMS arrived she was satting in the 50% on room air.  They put her on BiPAP without much improvement of her oxygen saturations.  Patient obtunded and unable to give additional history.  EMS gave Solu-Medrol , magnesium , and a DuoNeb en route.       Prior to Admission medications   Medication Sig Start Date End Date Taking? Authorizing Provider  Accu-Chek Softclix Lancets lancets DX: E11.21 as directed 12/06/16   [provider]  albuterol  (VENTOLIN  HFA) 108 (90 Base) MCG/ACT inhaler Inhale 2 puffs into the lungs See admin instructions. Inhale 2 puffs into the lungs in the morning and at bedtime- may use an additional 2 puffs twice a day as needed for shortness of breath or wheezing    [provider]  amLODipine  (NORVASC ) 10 MG tablet Take 10 mg by mouth daily. 03/04/17   [provider]  arformoterol  (BROVANA ) 15 MCG/2ML NEBU Take 2 mLs (15 mcg total) by nebulization 2 (two) times daily. 12/02/23 03/01/24  Briana Elgin LABOR, MD  aspirin  EC 81 MG tablet Take 81 mg by mouth daily.    [provider]  atorvastatin  (LIPITOR) 10 MG tablet Take 1 tablet (10 mg total) by mouth daily. Patient taking differently: Take 10 mg by mouth at bedtime. 06/08/20   Claudene Victory ORN, MD  Blood Glucose Calibration  (ACCU-CHEK AVIVA) SOLN DX: E11.21 as directed 12/06/16   [provider]  carvedilol  (COREG ) 25 MG tablet Take 12.5 mg by mouth in the morning and at bedtime.    [provider]  glucose blood (ACCU-CHEK AVIVA PLUS) test strip use as directed to check  blood sugar 4 times daily    [provider]  insulin  lispro (HUMALOG) 100 UNIT/ML KiwkPen Inject 8-13 Units into the skin See admin instructions. Inject 8-13 units into the skin three times a day with meals, PER SLIDING SCALE (when able to eat and tolerate) 01/22/18   [provider]  LANTUS  SOLOSTAR 100 UNIT/ML Solostar Pen Inject 22 Units into the skin in the morning.    [provider]  latanoprost  (XALATAN ) 0.005 % ophthalmic solution Place 1 drop into both eyes at bedtime.    [provider]  lidocaine  (XYLOCAINE ) 2 % solution Patient: Mix 1part 2% viscous lidocaine , 1part H20. Swallow 10mL of diluted mixture, before meals and at bedtime, up to QID 10/13/23   Izell Domino, MD  nitroGLYCERIN  (NITROSTAT ) 0.4 MG SL tablet Place 0.4 mg under the tongue every 5 (five) minutes as needed for chest pain.    [provider]  predniSONE  (DELTASONE ) 10 MG tablet Take 4 tablets (40 mg total) by mouth daily with breakfast for 22 days, THEN 3 tablets (30 mg total) daily with breakfast for 7 days, THEN 2 tablets (20 mg total) daily with breakfast for 7  days, THEN 1 tablet (10 mg total) daily with breakfast for 7 days. 12/03/23 01/15/24  Briana Elgin LABOR, MD  predniSONE  (DELTASONE ) 5 MG tablet Take 5 mg by mouth daily with breakfast.  12/22/17   [provider]  prochlorperazine  (COMPAZINE ) 10 MG tablet Take 1 tablet (10 mg total) by mouth every 6 (six) hours as needed for nausea or vomiting. 09/09/23   Sherrod Sherrod, MD  revefenacin  (YUPELRI ) 175 MCG/3ML nebulizer solution Take 3 mLs (175 mcg total) by nebulization daily. 12/03/23 03/02/24  Briana Elgin LABOR, MD  sirolimus  (RAPAMUNE ) 1 MG tablet  Take 2 mg by mouth in the morning. 09/16/23   [provider]  tacrolimus  ER (ENVARSUS  XR) 1 MG TB24 Take 1 mg by mouth daily. 08/10/20   [provider]    Allergies: Patient has no known allergies.    Review of Systems  Updated Vital Signs BP (!) 113/51   Pulse (!) 110   Temp 97.9 F (36.6 C) (Oral)   Resp (!) 21   SpO2 94%   Physical Exam Constitutional:      General: She is in acute distress.     Appearance: She is ill-appearing.     Comments: Obtunded on BiPAP  Eyes:     Pupils: Pupils are equal, round, and reactive to light.  Cardiovascular:     Rate and Rhythm: Regular rhythm. Tachycardia present.     Pulses: Normal pulses.     Heart sounds: Normal heart sounds.  Pulmonary:     Effort: Respiratory distress present.     Comments: Diminished breath sounds bilaterally Musculoskeletal:     Right lower leg: Edema present.     Left lower leg: Edema present.     (all labs ordered are listed, but only abnormal results are displayed) Labs Reviewed  COMPREHENSIVE METABOLIC PANEL WITH GFR - Abnormal; Notable for the following components:      Result Value   Sodium 134 (*)    Chloride 97 (*)    Glucose, Bld 172 (*)    Calcium  8.5 (*)    Total Protein 5.0 (*)    Albumin 1.7 (*)    All other components within normal limits  CBC - Abnormal; Notable for the following components:   RBC 3.27 (*)    Hemoglobin 9.2 (*)    HCT 30.6 (*)    RDW 17.3 (*)    Platelets 79 (*)    All other components within normal limits  BRAIN NATRIURETIC PEPTIDE - Abnormal; Notable for the following components:   B Natriuretic Peptide 470.8 (*)    All other components within normal limits  GLUCOSE, CAPILLARY - Abnormal; Notable for the following components:   Glucose-Capillary 151 (*)    All other components within normal limits  I-STAT ARTERIAL BLOOD GAS, ED - Abnormal; Notable for the following components:   pH, Arterial 7.565 (*)    pO2, Arterial 57 (*)    Bicarbonate  30.3 (*)    Acid-Base Excess 8.0 (*)    Sodium 127 (*)    HCT 23.0 (*)    Hemoglobin 7.8 (*)    All other components within normal limits  I-STAT VENOUS BLOOD GAS, ED - Abnormal; Notable for the following components:   pO2, Ven 67 (*)    Bicarbonate 31.8 (*)    TCO2 33 (*)    Acid-Base Excess 6.0 (*)    Sodium 132 (*)    HCT 26.0 (*)    Hemoglobin 8.8 (*)  All other components within normal limits  I-STAT CHEM 8, ED - Abnormal; Notable for the following components:   Sodium 132 (*)    Chloride 97 (*)    Glucose, Bld 177 (*)    Calcium , Ion 1.14 (*)    Hemoglobin 9.2 (*)    HCT 27.0 (*)    All other components within normal limits  CBG MONITORING, ED - Abnormal; Notable for the following components:   Glucose-Capillary 184 (*)    All other components within normal limits  TROPONIN I (HIGH SENSITIVITY) - Abnormal; Notable for the following components:   Troponin I (High Sensitivity) 24 (*)    All other components within normal limits  RESP PANEL BY RT-PCR (RSV, FLU A&B, COVID)  RVPGX2  MRSA NEXT GEN BY PCR, NASAL  CULTURE, BLOOD (ROUTINE X 2)  CULTURE, BLOOD (ROUTINE X 2)  CULTURE, RESPIRATORY W GRAM STAIN  LACTIC ACID, PLASMA  TRIGLYCERIDES  CBC  BASIC METABOLIC PANEL WITH GFR  BLOOD GAS, ARTERIAL  MAGNESIUM   PHOSPHORUS  BLOOD GAS, ARTERIAL    EKG: EKG Interpretation Date/Time:  Wednesday 01-Jan-2024 17:02:07 EDT Ventricular Rate:  116 PR Interval:  118 QRS Duration:  90 QT Interval:  302 QTC Calculation: 420 R Axis:   100  Text Interpretation: Sinus tachycardia Right axis deviation Minimal ST depression, inferior leads Confirmed by Yolande Charleston [phone]) on 2024-01-01 5:48:12 PM  Radiology: CT Angio Chest PE W and/or Wo Contrast Result Date: 01/01/24 CLINICAL DATA:  Respiratory distress. EXAM: CT ANGIOGRAPHY CHEST WITH CONTRAST TECHNIQUE: Multidetector CT imaging of the chest was performed using the standard protocol during bolus administration of  intravenous contrast. Multiplanar CT image reconstructions and MIPs were obtained to evaluate the vascular anatomy. RADIATION DOSE REDUCTION: This exam was performed according to the departmental dose-optimization program which includes automated exposure control, adjustment of the mA and/or kV according to patient size and/or use of iterative reconstruction technique. CONTRAST:  53mL OMNIPAQUE  IOHEXOL  350 MG/ML SOLN COMPARISON:  November 17, 2023.  History of lung cancer. FINDINGS: Cardiovascular: Satisfactory opacification of the pulmonary arteries to the segmental level. No evidence of pulmonary embolism. Normal heart size. No pericardial effusion. Coronary artery calcifications are noted. Mediastinum/Nodes: Nasogastric tube is seen entering stomach. Endotracheal tube is in grossly good position. Thyroid gland is unremarkable. No definite adenopathy is noted. Lungs/Pleura: Small bilateral pleural effusions are noted, left greater than right. No pneumothorax is noted. Emphysematous disease is noted. Large left upper and lower lobe opacities are noted concerning for pneumonia. Large right lower lobe opacity is noted also concerning for pneumonia. Cavitary lesion seen in left lower lobe on prior exam is not visualized due to surrounding consolidation. Upper Abdomen: No acute abnormality. Musculoskeletal: No chest wall abnormality. No acute or significant osseous findings. Review of the MIP images confirms the above findings. IMPRESSION: No definite evidence of pulmonary embolus. Large bilateral lung opacities are noted with associated pleural effusions, left greater than right, most consistent with severe multifocal pneumonia. Endotracheal and nasogastric tubes are in grossly good position. Aortic Atherosclerosis (ICD10-I70.0) and Emphysema (ICD10-J43.9). Electronically Signed   By: Lynwood Landy Raddle M.D.   On: 01-Jan-2024 18:43   DG Abdomen 1 View Result Date: 01/01/2024 CLINICAL DATA:  OG tube EXAM: ABDOMEN - 1 VIEW  COMPARISON:  Sep 29, 2023 FINDINGS: Esophagogastric tube terminates in the region of the gastric antrum or duodenal bulb. Nonobstructive bowel gas pattern. No pneumoperitoneum. No organomegaly or radiopaque calculi. No acute fracture or destructive lesion. The lung bases  are clear. IMPRESSION: 1. Esophagogastric tube terminates in the region of the gastric antrum or duodenal bulb. 2. Nonobstructive bowel gas pattern. Electronically Signed   By: Rogelia Myers M.D.   On: December 25, 2023 17:34   DG Chest Port 1 View Result Date: 2023/12/25 CLINICAL DATA:  resp distress EXAM: PORTABLE CHEST - 1 VIEW COMPARISON:  November 29, 2023, November 17, 2023 FINDINGS: Endotracheal tube terminates in the mid trachea. Esophagogastric tube courses below the diaphragm with the distal tip not included in the field of view. Diffuse interstitial opacities throughout both lungs. Patchy airspace opacities in the right lung base and left mid lung. Moderate left pleural effusion with trace right pleural effusion. No pneumothorax. Cavitary mass in the left lower lobe on the prior CT is not well visualized. No cardiomegaly. Tortuous aorta with aortic atherosclerosis. No acute fracture or destructive lesions. Multilevel thoracic osteophytosis. Multiple vascular stents in the left axilla. IMPRESSION: 1. Well-positioned endotracheal tube. Esophagogastric tube courses below the diaphragm with the distal tip not included in the field of view. 2. Lung findings of either asymmetric multifocal pneumonia or asymmetric pulmonary edema. Moderate left pleural effusion with trace right pleural effusion. Electronically Signed   By: Rogelia Myers M.D.   On: Dec 25, 2023 17:23     Procedure Name: Intubation Date/Time: 25-Dec-2023 5:20 PM  Performed by: Yolande Lamar BROCKS, MDPre-anesthesia Checklist: Patient identified, Emergency Drugs available, Suction available and Patient being monitored Oxygen Delivery Method: Ambu bag Preoxygenation: Pre-oxygenation with  100% oxygen Induction Type: IV induction and Rapid sequence Laryngoscope Size: Glidescope and 3 Grade View: Grade I Tube size: 7.5 mm Number of attempts: 1 Placement Confirmation: ETT inserted through vocal cords under direct vision, Positive ETCO2, CO2 detector and Breath sounds checked- equal and bilateral Secured at: 22 cm Tube secured with: ETT holder Dental Injury: Teeth and Oropharynx as per pre-operative assessment        Medications Ordered in the ED  propofol  (DIPRIVAN ) 1000 MG/100ML infusion (40 mcg/kg/min  72.9 kg Intravenous Rate/Dose Change 12/25/2023 1732)  fentaNYL  (SUBLIMAZE ) injection 25-50 mcg (has no administration in time range)  fentaNYL  in NS (76mcg/ml) infusion-PREMIX (25 mcg/hr Intravenous New Bag/Given 12/25/23 1713)  fentaNYL  (SUBLIMAZE ) bolus via infusion 25-100 mcg (has no administration in time range)  Chlorhexidine  Gluconate Cloth 2 % PADS 6 each (6 each Topical Given 12/25/23 1848)  docusate (COLACE) 50 MG/5ML liquid 100 mg (has no administration in time range)  polyethylene glycol (MIRALAX  / GLYCOLAX ) packet 17 g (has no administration in time range)  enoxaparin  (LOVENOX ) injection 40 mg (40 mg Subcutaneous Given December 25, 2023 1759)  famotidine  (PEPCID ) tablet 20 mg (has no administration in time range)  insulin  aspart (novoLOG ) injection 0-15 Units (3 Units Subcutaneous Given 12-25-2023 1808)  piperacillin -tazobactam (ZOSYN ) IVPB 3.375 g (has no administration in time range)  arformoterol  (BROVANA ) nebulizer solution 15 mcg (has no administration in time range)  budesonide  (PULMICORT ) nebulizer solution 0.25 mg (has no administration in time range)  revefenacin  (YUPELRI ) nebulizer solution 175 mcg (has no administration in time range)  hydrocortisone  sodium succinate  (SOLU-CORTEF ) 100 MG injection 100 mg (100 mg Intravenous Given 12-25-23 1854)  etomidate  (AMIDATE ) injection (20 mg Intravenous Given 2023-12-25 1657)  rocuronium  (ZEMURON ) injection (75 mg  Intravenous Given 12-25-2023 1658)  albuterol  (PROVENTIL ) (2.5 MG/3ML) 0.083% nebulizer solution (10 mg/hr Nebulization Given 12/25/23 1722)  vancomycin  (VANCOREADY) IVPB 1500 mg/300 mL (1,500 mg Intravenous New Bag/Given 12-25-2023 1858)  piperacillin -tazobactam (ZOSYN ) IVPB 3.375 g (3.375 g Intravenous New Bag/Given 2023-12-25 1759)  iohexol  (OMNIPAQUE )  350 MG/ML injection 53 mL (53 mLs Intravenous Contrast Given 2024-01-07 1831)    Clinical Course as of 2024-01-07 2335  Wed Jan 07, 2024  1710 Dw Leita Gleason [RP]    Clinical Course User Index [RP] Yolande Lamar BROCKS, MD                                 Medical Decision Making Amount and/or Complexity of Data Reviewed Labs: ordered. Radiology: ordered.  Risk Prescription drug management. Decision regarding hospitalization.   73 year old female with history of lung cancer in remission, COPD, hypertension, hyperlipidemia, CAD, renal transplant, and heart failure with reduced EF who presents to the emergency department respiratory distress.   Initial Ddx:  Hypercapnic respiratory failure, hypoxic respiratory failure, respiratory distress, pneumonia, COPD, CHF, PE  MDM/Course:  Patient presents emergency department respiratory distress.  She is satting in the 50s on BiPAP.  Appears to be hemodynamically stable at this point in time.  She is a quiet chest.  She was intubated after confirming CODE STATUS with her husband who desires intubation and ventilator but no CPR or defibrillation.  She was started on broad-spectrum antibiotics in case of pneumonia.  CTA ordered which will be obtained on the way up to the ICU.   This patient presents to the ED for concern of complaints listed in HPI, this involves an extensive number of treatment options, and is a complaint that carries with it a high risk of complications and morbidity. Disposition including potential need for admission considered.   Dispo: ICU  Additional history obtained from  spouse Records reviewed Outpatient Clinic Notes The following labs were independently interpreted: Chemistry and show no acute abnormality I independently reviewed the following imaging with scope of interpretation limited to determining acute life threatening conditions related to emergency care: Chest x-ray and agree with the radiologist interpretation with the following exceptions: none I personally reviewed and interpreted cardiac monitoring: sinus tachycardia I personally reviewed and interpreted the pt's EKG: see above for interpretation  I have reviewed the patients home medications and made adjustments as needed Consults: Critical care Social Determinants of health:  Geriatric  Portions of this note were generated with Scientist, clinical (histocompatibility and immunogenetics). Dictation errors may occur despite best attempts at proofreading.    CRITICAL CARE Performed by: Lamar BROCKS Yolande   Total critical care time: 45 minutes  Critical care time was exclusive of separately billable procedures and treating other patients.  Critical care was necessary to treat or prevent imminent or life-threatening deterioration.  Critical care was time spent personally by me on the following activities: development of treatment plan with patient and/or surrogate as well as nursing, discussions with consultants, evaluation of patient's response to treatment, examination of patient, obtaining history from patient or surrogate, ordering and performing treatments and interventions, ordering and review of laboratory studies, ordering and review of radiographic studies, pulse oximetry and re-evaluation of patient's condition.   Final diagnoses:  COPD exacerbation (HCC)  HCAP (healthcare-associated pneumonia)  Acute hypoxic respiratory failure [facility])    ED Discharge Orders     None          Yolande Lamar BROCKS, MD 2024/01/07 [id number]

## 2023-12-12 NOTE — Telephone Encounter (Signed)
 Thank you very much

## 2023-12-12 NOTE — Telephone Encounter (Signed)
 Copied from CRM [id number]. Topic: Clinical - Medical Advice >> 2024-01-02 11:55 AM Rozanna MATSU wrote: Reason for CRM: PT SPOUSE STATED PT NEEDS MORE OXYGEN WITH CO2 MASK AND REQUESTING THIS TO BE CHANGED. SEND AN ORDER TO [facility] FOR THIS TO BE CHANGED   Called patient.  Spoke with patient's husband, Ozell ([place]).  Husband states patient is currently being taken to [facility] Emergency Room via ambulance for SOB and dyspnea.  Informed patient ED provider will take over care and handle patient's need for oxygen at this time.  Will send note to Dr. Shelah informing of this.    Patient has upcoming appointment with Dr. Shelah on 01/07/24 at 10:45 am.  Dr. Shelah, RICK.  Thank you

## 2023-12-12 NOTE — Progress Notes (Signed)
 Pt had PIV with propofol  and fent running in L AC arm with fistula, took PIV out on arrival to unit.  Shortly after removal hematoma present and progressively growing.  MD aware

## 2023-12-12 NOTE — Progress Notes (Signed)
 Cardiopulmonary Resuscitation Note  Pamela [name]  [id number]  Jan 12, 1951  Date:12-30-23  Time:7:23 PM   Provider Performing:Taryne Kiger Pamela [name]   Procedure: Cardiopulmonary Resuscitation [phone])  Indication(s) Loss of Pulse  Consent N/A  Anesthesia N/A   Time Out N/A   Sterile Technique Hand hygiene, gloves   Procedure Description  Received phone call by ICU RN, Harlene MORTON RN, that patient was desaturating within the low 80s and beginning to utilize Ambu-bag at 1905. Came to patient's room immediately at 1908 where CODE BLUE was called/initiated. Initial rhythm was PEA/Asystole.Patient has wishes of no chest compressions. Epinephrine  x 1 along with atropine x 1. Patient continue to be within PEA- HR 30s-40s with long pauses initially and proceeded into asystole. No shock-able rhythm present. Return of spontaneous circulation was not achieved.  Discussed with husband that patient was in full cardiac arrest and confirmed with husband that patient would not want chest compressions or interventions that would make her suffer.  Discussed with husband and patient was actively dying despite interventions.     Family at bedside.   Complications/Tolerance N/A   EBL N/A   Specimen(s) N/A  Estimated time to ROSC: TOD 1914   Pamela [name] AGACNP-BC   [facility] December 30, 2023, 7:34 PM  Please see [email or web address] for pager details.  From 7A-7P if no response, please call [phone]. After hours, please call ELink [phone].

## 2023-12-12 NOTE — Progress Notes (Signed)
 I responded to a page from the nurse to provide spiritual support to the patient's family. I arrived at the patient's room where several family members were present. I provided spiritual support through pastoral presence, sharing words of comfort, and leading in prayer.    01/08/2024 2158  Spiritual Encounters  Type of Visit Initial  Care provided to: [facility] partners present during encounter Nurse  Referral source Nurse (RN/NT/LPN)  Reason for visit Patient death  OnCall Visit Yes  Interventions  Spiritual Care Interventions Made Established relationship of care and support;Compassionate presence;Bereavement/grief support;Prayer;Supported grief process    Chaplain Dr Ozell Law

## 2023-12-12 NOTE — Progress Notes (Signed)
 Interim CCM Progress Note:   Called to beside by ICU RN, Harlene MATSU, in regards to patient desaturating despite 100% Fio2 and PEEP of 12. Upon arrival at 1908, CODE BLUE called. Initial assessment of patient, abdomen distended/taut. Breath sounds auscultated when utilizing the ambu bag. Inline suction able to be passed as well. With abdomen distended/taut, and decreasing hemoglobin on blood gases, concern for possible intra-abdominal bleeding/process.Also patient had hematoma from fistula developing from IV stick while within ED. Hematoma appeared to be stable. See CPR note.   Sherlean Sharps AGACNP-BC   [facility] 01/02/2024, 8:03 PM  Please see [email or web address] for pager details.  From 7A-7P if no response, please call [phone]. After hours, please call ELink [phone].

## 2023-12-12 NOTE — Code Documentation (Signed)
 Husband pulled to room as soon as pt arrived into room & EDP Yolande confirmed with husband pt is okay with intubation but does not wish for compression in the event of cardiac event.

## 2023-12-12 NOTE — H&P (Signed)
 NAME:  Pamela [name], MRN:  [id number], DOB:  03-22-1951, LOS: 0 ADMISSION DATE:  01/09/2024, CONSULTATION DATE:  01-09-2024 REFERRING MD:  Yolande, EDP CHIEF COMPLAINT: resp failure    History of Present Illness:  73 year old female with past medical history of CKD/ESRD s/p renal transplant 2019, diabetes, HTN, CAD s/p PCI 2002, systolic heart failure, HLD COPD, NSCLC stage IIIb chemo x7,XRT 7 weeks finished radiation 11/10/23, reportedly in remission who presents to [facility] with respiratory failure.   HPI obtained through EDP, chart review as patient is intubated, sedated, no family present. Husband reported patient has decompensated over the past 2 days. Noted she looked ill so he call 911. Per EMS, sats were 50s on room air and placed her on BiPAP without much improvement. She was obtunded on arrival to ED and patient was intubated. Labs at time of admission pending. CXR obtained show new RLL infilatrate, what appears to be left pleural effusion, and left cavitary lesion.   Pertinent  Medical History  CKD/ESRD s/p renal transplant 2019, diabetes, HTN, CAD s/p PCI 2002, systolic heart failure, HLD COPD, NSCLC stage IIIb chemo x7,XRT 7 weeks finished radiation 11/10/23  Significant Hospital Events: Including procedures, antibiotic start and stop dates in addition to other pertinent events   01-09-24: admit for respiratory failure query aspiration   Interim History / Subjective:  Admit to ICU for respiratory failure   Objective   Blood pressure 137/74, pulse (!) 119, temperature 99.3 F (37.4 C), temperature source Oral, resp. rate 16, SpO2 95%.    Vent Mode: PRVC FiO2 (%):  [100 %] 100 % Set Rate:  [18 bmp] 18 bmp Vt Set:  [420 mL] 420 mL PEEP:  [8 cmH20] 8 cmH20 Plateau Pressure:  [24 cmH20] 24 cmH20  No intake or output data in the 24 hours ending January 09, 2024 1749 There were no vitals filed for this visit.  Examination: General: older appearing female, critically ill, sedated HENT: perrla,  anicteric sclera, ett, ogt with gastric contents  Lungs: diminished bases, scattered rhonchi lower>upper, vented with mild dysynchrony  Cardiovascular: s1s2, sinus tachycardia, no lower edema  Abdomen: soft, OGT with gastric contents  Extremities: no edema  Neuro: sedated GU: foley   Resolved Hospital Problem list    Assessment & Plan:  Acute hypoxic respiratory failure 2/2 possible aspiration event  COPD  NSCLC stage IIIb reportedly in remission  Cavitary lung lesion  Presents with 2 days worsening symptoms and o2 sats 50% on room air. Obtunded and intubated. Possible that she had aspiration event with amount of gastric contents from OGTCXR with opacity RLL, cavitary lesion and effusion on the left.  - CTA pending  - cover with zosyn  vancomycin   - f/u MRSA PCR, resp viral panel - on prednisone  taper at home - > place on hydrocortisone  100mg  q12h for now  - full mechanical vent support - lung protective ventilation 6-8cc/kg Vt - VAP and PAD bundle in place  - titrate FiO2 to sat goal >92  - maintain peak/plats <30, driving pressures <84    SIRS  Tachycardic, temp 99 with resp failure  - f/u blood cultures, resp culture, procal  - lactic acid now  - zosyn  and vancomycin , narrow with culture data   CKD/ESRD s/p renal transplant 2019 on tacrolimus  sirolimus   sCr stable on I-stat chem 8  - f/u full BMP, mag, phos  - strict I/O  - foley insert   Diabetes  - ssi  - cbg q4h   HTN HLD - restart  statin tomorrow - on amlodipine , coreg  at home. Bp okay for now   HFrEF CAD s/p PCI 2002 - GDMT as able  - tele monitoring  - does not appear volume overloaded  Best Practice (right click and Reselect all SmartList Selections daily)   Diet/type: NPO DVT prophylaxis: LMWH GI prophylaxis: H2B Lines: N/A Foley:  Yes, and it is still needed Code Status:  DNR Last date of multidisciplinary goals of care discussion [pending]  Labs   CBC: Recent Labs  Lab 2024-01-07 1706  01/07/24 1717  WBC 6.3  --   HGB 9.2* 8.8*  HCT 30.6* 26.0*  MCV 93.6  --   PLT 79*  --     Basic Metabolic Panel: Recent Labs  Lab Jan 07, 2024 1717  NA 132*  K 4.7   GFR: Estimated Creatinine Clearance: 57.9 mL/min (by C-G formula based on SCr of 0.48 mg/dL). Recent Labs  Lab 01/07/24 1706  WBC 6.3    Liver Function Tests: No results for input(s): AST, ALT, ALKPHOS, BILITOT, PROT, ALBUMIN in the last 168 hours. No results for input(s): LIPASE, AMYLASE in the last 168 hours. No results for input(s): AMMONIA in the last 168 hours.  ABG    Component Value Date/Time   PHART 7.385 01/18/2015 1023   PCO2ART 48.4 (H) 01/18/2015 1023   PO2ART 47.0 (L) 01/18/2015 1023   HCO3 31.8 (H) 2024/01/07 1717   TCO2 33 (H) 01/07/2024 1717   ACIDBASEDEF 7.0 (H) 01/16/2015 1413   O2SAT 92 01/07/24 1717     Coagulation Profile: No results for input(s): INR, PROTIME in the last 168 hours.  Cardiac Enzymes: No results for input(s): CKTOTAL, CKMB, CKMBINDEX, TROPONINI in the last 168 hours.  HbA1C: Hgb A1c MFr Bld  Date/Time Value Ref Range Status  09/30/2023 04:16 AM 7.4 (H) 4.8 - 5.6 % Final    Comment:    (NOTE) Pre diabetes:          5.7%-6.4%  Diabetes:              >6.4%  Glycemic control for   <7.0% adults with diabetes   01/21/2015 01:06 PM 6.8 (H) 4.8 - 5.6 % Final    Comment:    (NOTE)         Pre-diabetes: 5.7 - 6.4         Diabetes: >6.4         Glycemic control for adults with diabetes: <7.0     CBG: No results for input(s): GLUCAP in the last 168 hours.  Review of Systems:   As above  Past Medical History:  She,  has a past medical history of Anemia, Anxiety, CHF (congestive heart failure) (HCC), Chronic kidney disease, COPD (chronic obstructive pulmonary disease) (HCC), Coronary artery disease, Diabetes mellitus without complication (HCC) (10/29/2012), ESRD on dialysis (HCC) (01/25/2015), Headache, Hypertension, PONV  (postoperative nausea and vomiting), and Shortness of breath dyspnea.   Surgical History:   Past Surgical History:  Procedure Laterality Date   ABDOMINAL HYSTERECTOMY     ANAL FISSURE REPAIR     APPENDECTOMY     '74- open with gallbladder   AV FISTULA PLACEMENT Left 01/31/2015   Procedure: ARTERIOVENOUS FISTULA CREATION-LEFT BRACHIO-CEPHALIC ;  Surgeon: Redell LITTIE Door, MD;  Location: [facility] OR;  Service: Vascular;  Laterality: Left;   AV FISTULA PLACEMENT Left 04/04/2015   Procedure: INSERTION OF ARTERIOVENOUS (AV) GORE-TEX GRAFT ARM;  Surgeon: Carlin FORBES Haddock, MD;  Location: [facility] OR;  Service: Vascular;  Laterality: Left;  BILIARY BRUSHING  10/01/2023   Procedure: BRUSH BIOPSY, BILE DUCT;  Surgeon: Saintclair Jasper, MD;  Location: [facility] ENDOSCOPY;  Service: Gastroenterology;;   BREAST EXCISIONAL BIOPSY Left    BREAST SURGERY     cyst removed   BRONCHIAL BIOPSY  08/18/2023   Procedure: BRONCHOSCOPY, WITH BIOPSY;  Surgeon: Shelah Lamar RAMAN, MD;  Location: [facility] ENDOSCOPY;  Service: Pulmonary;;   BRONCHIAL BRUSHINGS  08/18/2023   Procedure: BRONCHOSCOPY, WITH BRUSH BIOPSY;  Surgeon: Shelah Lamar RAMAN, MD;  Location: [facility] ENDOSCOPY;  Service: Pulmonary;;   CARDIAC CATHETERIZATION     1 coronary stent placed   CHOLECYSTECTOMY     '74-open   COLONOSCOPY WITH PROPOFOL  N/A 11/17/2012   Procedure: COLONOSCOPY WITH PROPOFOL ;  Surgeon: Gladis MARLA Louder, MD;  Location: [facility] ENDOSCOPY;  Service: Endoscopy;  Laterality: N/A;   CORONARY STENT PLACEMENT     ELBOW SURGERY Right    tendon surgery   ERCP N/A 10/01/2023   Procedure: ERCP, WITH INTERVENTION IF INDICATED;  Surgeon: Saintclair Jasper, MD;  Location: [facility] ENDOSCOPY;  Service: Gastroenterology;  Laterality: N/A;   ESOPHAGOGASTRODUODENOSCOPY (EGD) WITH PROPOFOL  N/A 11/17/2012   Procedure: ESOPHAGOGASTRODUODENOSCOPY (EGD) WITH PROPOFOL ;  Surgeon: Gladis MARLA Louder, MD;  Location: [facility] ENDOSCOPY;  Service: Endoscopy;  Laterality: N/A;   GANGLION CYST EXCISION Bilateral 10/29/2012    wrist   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   LEFT HEART CATHETERIZATION WITH CORONARY ANGIOGRAM N/A 04/14/2014   Procedure: LEFT HEART CATHETERIZATION WITH CORONARY ANGIOGRAM;  Surgeon: Victory LELON Claudene DOUGLAS, MD;  Location: [facility] CATH LAB;  Service: Cardiovascular;  Laterality: N/A;   PERIPHERAL VASCULAR CATHETERIZATION N/A 03/09/2015   Procedure: Fistulagram;  Surgeon: Redell LITTIE Door, MD;  Location: [facility] INVASIVE CV LAB;  Service: Cardiovascular;  Laterality: N/A;   STONE EXTRACTION WITH BASKET  10/01/2023   Procedure: ERCP, WITH LITHROTRIPSY OR REMOVAL OF COMMON BILE DUCT CALCULUS USING BASKET;  Surgeon: Saintclair Jasper, MD;  Location: [facility] ENDOSCOPY;  Service: Gastroenterology;;   TEE WITHOUT CARDIOVERSION N/A 01/20/2015   Procedure: TRANSESOPHAGEAL ECHOCARDIOGRAM (TEE);  Surgeon: Vina Okey GAILS, MD;  Location: [facility] ENDOSCOPY;  Service: Cardiovascular;  Laterality: N/A;   TUBAL LIGATION     VIDEO BRONCHOSCOPY  08/18/2023   Procedure: BRONCHOSCOPY, WITH FLUOROSCOPY;  Surgeon: Shelah Lamar RAMAN, MD;  Location: [facility] ENDOSCOPY;  Service: Pulmonary;;     Social History:   reports that she quit smoking about 31 years ago. Her smoking use included cigarettes. She has never used smokeless tobacco. She reports that she does not drink alcohol and does not use drugs.   Family History:  Her family history includes Breast cancer in her sister; Cancer in her father and sister; Diabetes in her father and sister; Heart attack in her mother; Hypertension in her father, mother, sister, and sister.   Allergies No Known Allergies   Home Medications  Prior to Admission medications   Medication Sig Start Date End Date Taking? Authorizing Provider  Accu-Chek Softclix Lancets lancets DX: E11.21 as directed 12/06/16   [provider]  albuterol  (VENTOLIN  HFA) 108 (90 Base) MCG/ACT inhaler Inhale 2 puffs into the lungs See admin instructions. Inhale 2 puffs into the lungs in the morning and at bedtime- may use an additional 2 puffs  twice a day as needed for shortness of breath or wheezing    [provider]  amLODipine  (NORVASC ) 10 MG tablet Take 10 mg by mouth daily. 03/04/17   [provider]  arformoterol  (BROVANA ) 15 MCG/2ML NEBU Take 2 mLs (15 mcg total) by  nebulization 2 (two) times daily. 12/02/23 03/01/24  Briana Elgin LABOR, MD  aspirin  EC 81 MG tablet Take 81 mg by mouth daily.    [provider]  atorvastatin  (LIPITOR) 10 MG tablet Take 1 tablet (10 mg total) by mouth daily. Patient taking differently: Take 10 mg by mouth at bedtime. 06/08/20   Claudene Victory ORN, MD  Blood Glucose Calibration (ACCU-CHEK AVIVA) SOLN DX: E11.21 as directed 12/06/16   [provider]  carvedilol  (COREG ) 25 MG tablet Take 12.5 mg by mouth in the morning and at bedtime.    [provider]  glucose blood (ACCU-CHEK AVIVA PLUS) test strip use as directed to check  blood sugar 4 times daily    [provider]  insulin  lispro (HUMALOG) 100 UNIT/ML KiwkPen Inject 8-13 Units into the skin See admin instructions. Inject 8-13 units into the skin three times a day with meals, PER SLIDING SCALE (when able to eat and tolerate) 01/22/18   [provider]  LANTUS  SOLOSTAR 100 UNIT/ML Solostar Pen Inject 22 Units into the skin in the morning.    [provider]  latanoprost  (XALATAN ) 0.005 % ophthalmic solution Place 1 drop into both eyes at bedtime.    [provider]  lidocaine  (XYLOCAINE ) 2 % solution Patient: Mix 1part 2% viscous lidocaine , 1part H20. Swallow 10mL of diluted mixture, before meals and at bedtime, up to QID 10/13/23   Izell Domino, MD  nitroGLYCERIN  (NITROSTAT ) 0.4 MG SL tablet Place 0.4 mg under the tongue every 5 (five) minutes as needed for chest pain.    [provider]  predniSONE  (DELTASONE ) 10 MG tablet Take 4 tablets (40 mg total) by mouth daily with breakfast for 22 days, THEN 3 tablets (30 mg total) daily with breakfast for 7 days, THEN 2  tablets (20 mg total) daily with breakfast for 7 days, THEN 1 tablet (10 mg total) daily with breakfast for 7 days. 12/03/23 01/15/24  Briana Elgin LABOR, MD  predniSONE  (DELTASONE ) 5 MG tablet Take 5 mg by mouth daily with breakfast.  12/22/17   [provider]  prochlorperazine  (COMPAZINE ) 10 MG tablet Take 1 tablet (10 mg total) by mouth every 6 (six) hours as needed for nausea or vomiting. 09/09/23   Sherrod Sherrod, MD  revefenacin  (YUPELRI ) 175 MCG/3ML nebulizer solution Take 3 mLs (175 mcg total) by nebulization daily. 12/03/23 03/02/24  Briana Elgin LABOR, MD  sirolimus  (RAPAMUNE ) 1 MG tablet Take 2 mg by mouth in the morning. 09/16/23   [provider]  tacrolimus  ER (ENVARSUS  XR) 1 MG TB24 Take 1 mg by mouth daily. 08/10/20   [provider]     Critical care time: 55   Tinnie FORBES Adolph DEVONNA [facility] 12-17-23 5:51 PM  Please see [email or web address] for pager details.  From 7A-7P if no response, please call [phone] After hours, please call ELink [phone]

## 2023-12-12 NOTE — ED Notes (Signed)
 X-ray at bedside

## 2023-12-12 NOTE — ED Notes (Signed)
 Pt transported OTF with Dozier, RN & Megan RT, going to CT #1 then 3M09.

## 2023-12-12 DEATH — deceased

## 2023-12-13 LAB — CULTURE, BLOOD (ROUTINE X 2): Special Requests: ADEQUATE

## 2023-12-17 NOTE — Telephone Encounter (Signed)
 Pamela [name]

## 2024-01-07 ENCOUNTER — Inpatient Hospital Stay: Admitting: Emergency Medicine

## 2024-01-22 MED FILL — Medication: Qty: 1 | Status: AC

## 2024-04-29 ENCOUNTER — Encounter: Payer: Self-pay | Admitting: Acute Care
